# Patient Record
Sex: Female | Born: 1970 | Race: Black or African American | Hispanic: No | Marital: Single | Smoking: Current every day smoker
Health system: Southern US, Community
[De-identification: ages and names within clinical notes are randomized; demographics above are authoritative.]

## PROBLEM LIST (undated history)

## (undated) DIAGNOSIS — F32A Depression, unspecified: Secondary | ICD-10-CM

## (undated) DIAGNOSIS — R519 Headache, unspecified: Secondary | ICD-10-CM

## (undated) DIAGNOSIS — Z9289 Personal history of other medical treatment: Secondary | ICD-10-CM

## (undated) DIAGNOSIS — Z720 Tobacco use: Secondary | ICD-10-CM

## (undated) DIAGNOSIS — I10 Essential (primary) hypertension: Secondary | ICD-10-CM

## (undated) DIAGNOSIS — E785 Hyperlipidemia, unspecified: Secondary | ICD-10-CM

## (undated) DIAGNOSIS — N289 Disorder of kidney and ureter, unspecified: Secondary | ICD-10-CM

## (undated) DIAGNOSIS — F329 Major depressive disorder, single episode, unspecified: Secondary | ICD-10-CM

## (undated) DIAGNOSIS — I25119 Atherosclerotic heart disease of native coronary artery with unspecified angina pectoris: Secondary | ICD-10-CM

## (undated) DIAGNOSIS — R51 Headache: Secondary | ICD-10-CM

## (undated) DIAGNOSIS — I2102 ST elevation (STEMI) myocardial infarction involving left anterior descending coronary artery: Secondary | ICD-10-CM

## (undated) DIAGNOSIS — E119 Type 2 diabetes mellitus without complications: Secondary | ICD-10-CM

## (undated) DIAGNOSIS — N870 Mild cervical dysplasia: Secondary | ICD-10-CM

## (undated) DIAGNOSIS — I2119 ST elevation (STEMI) myocardial infarction involving other coronary artery of inferior wall: Secondary | ICD-10-CM

## (undated) HISTORY — DX: Hyperlipidemia, unspecified: E78.5

## (undated) HISTORY — DX: ST elevation (STEMI) myocardial infarction involving other coronary artery of inferior wall: I21.19

## (undated) HISTORY — DX: Essential (primary) hypertension: I10

## (undated) HISTORY — DX: ST elevation (STEMI) myocardial infarction involving left anterior descending coronary artery: I21.02

## (undated) HISTORY — PX: TRANSTHORACIC ECHOCARDIOGRAM: SHX275

## (undated) HISTORY — DX: Tobacco use: Z72.0

## (undated) HISTORY — DX: Atherosclerotic heart disease of native coronary artery with unspecified angina pectoris: I25.119

## (undated) HISTORY — PX: CHOLECYSTECTOMY: SHX55

## (undated) HISTORY — DX: Personal history of other medical treatment: Z92.89

---
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
## 1992-07-31 HISTORY — PX: TUBAL LIGATION: SHX77

## 1999-07-14 ENCOUNTER — Emergency Department (HOSPITAL_COMMUNITY): Admission: EM | Admit: 1999-07-14 | Discharge: 1999-07-14 | Payer: Self-pay | Admitting: Emergency Medicine

## 2000-09-11 ENCOUNTER — Emergency Department (HOSPITAL_COMMUNITY): Admission: EM | Admit: 2000-09-11 | Discharge: 2000-09-11 | Payer: Self-pay | Admitting: Emergency Medicine

## 2001-01-02 ENCOUNTER — Emergency Department (HOSPITAL_COMMUNITY): Admission: EM | Admit: 2001-01-02 | Discharge: 2001-01-02 | Payer: Self-pay | Admitting: Emergency Medicine

## 2001-01-21 ENCOUNTER — Encounter: Payer: Self-pay | Admitting: Internal Medicine

## 2001-01-21 ENCOUNTER — Ambulatory Visit (HOSPITAL_COMMUNITY): Admission: RE | Admit: 2001-01-21 | Discharge: 2001-01-21 | Payer: Self-pay | Admitting: Internal Medicine

## 2002-09-13 ENCOUNTER — Emergency Department (HOSPITAL_COMMUNITY): Admission: EM | Admit: 2002-09-13 | Discharge: 2002-09-13 | Payer: Self-pay | Admitting: Emergency Medicine

## 2002-11-26 ENCOUNTER — Other Ambulatory Visit: Admission: RE | Admit: 2002-11-26 | Discharge: 2002-11-26 | Payer: Self-pay | Admitting: Family Medicine

## 2004-06-29 ENCOUNTER — Ambulatory Visit: Payer: Self-pay | Admitting: Internal Medicine

## 2005-10-31 ENCOUNTER — Ambulatory Visit: Payer: Self-pay | Admitting: Internal Medicine

## 2005-11-02 ENCOUNTER — Ambulatory Visit: Payer: Self-pay | Admitting: Internal Medicine

## 2005-11-21 ENCOUNTER — Ambulatory Visit: Payer: Self-pay | Admitting: Internal Medicine

## 2005-11-21 ENCOUNTER — Other Ambulatory Visit: Admission: RE | Admit: 2005-11-21 | Discharge: 2005-11-21 | Payer: Self-pay | Admitting: Family Medicine

## 2005-11-21 ENCOUNTER — Encounter: Payer: Self-pay | Admitting: Internal Medicine

## 2006-09-10 ENCOUNTER — Emergency Department (HOSPITAL_COMMUNITY): Admission: EM | Admit: 2006-09-10 | Discharge: 2006-09-10 | Payer: Self-pay | Admitting: Emergency Medicine

## 2006-09-13 ENCOUNTER — Ambulatory Visit: Payer: Self-pay | Admitting: Internal Medicine

## 2006-09-17 ENCOUNTER — Ambulatory Visit: Payer: Self-pay | Admitting: *Deleted

## 2006-09-21 ENCOUNTER — Ambulatory Visit: Payer: Self-pay | Admitting: Internal Medicine

## 2006-10-05 ENCOUNTER — Ambulatory Visit: Payer: Self-pay | Admitting: Internal Medicine

## 2006-12-17 ENCOUNTER — Ambulatory Visit: Payer: Self-pay | Admitting: Internal Medicine

## 2006-12-19 ENCOUNTER — Ambulatory Visit: Payer: Self-pay | Admitting: Internal Medicine

## 2007-04-17 ENCOUNTER — Encounter (INDEPENDENT_AMBULATORY_CARE_PROVIDER_SITE_OTHER): Payer: Self-pay | Admitting: *Deleted

## 2007-05-28 ENCOUNTER — Ambulatory Visit: Payer: Self-pay | Admitting: *Deleted

## 2007-09-16 ENCOUNTER — Ambulatory Visit: Payer: Self-pay | Admitting: Internal Medicine

## 2007-11-12 ENCOUNTER — Encounter (INDEPENDENT_AMBULATORY_CARE_PROVIDER_SITE_OTHER): Payer: Self-pay | Admitting: Family Medicine

## 2007-11-12 ENCOUNTER — Ambulatory Visit: Payer: Self-pay | Admitting: Internal Medicine

## 2007-11-12 LAB — CONVERTED CEMR LAB
BUN: 13 mg/dL (ref 6–23)
Basophils Absolute: 0.1 10*3/uL (ref 0.0–0.1)
Basophils Relative: 1 % (ref 0–1)
CO2: 22 meq/L (ref 19–32)
Calcium: 9.8 mg/dL (ref 8.4–10.5)
Chloride: 100 meq/L (ref 96–112)
Creatinine, Ser: 0.78 mg/dL (ref 0.40–1.20)
Eosinophils Absolute: 0.5 10*3/uL (ref 0.0–0.7)
Eosinophils Relative: 4 % (ref 0–5)
Free T4: 0.92 ng/dL (ref 0.89–1.80)
Glucose, Bld: 127 mg/dL — ABNORMAL HIGH (ref 70–99)
HCT: 37.7 % (ref 36.0–46.0)
Hemoglobin: 12.3 g/dL (ref 12.0–15.0)
Lymphocytes Relative: 33 % (ref 12–46)
Lymphs Abs: 3.9 10*3/uL (ref 0.7–4.0)
MCHC: 32.6 g/dL (ref 30.0–36.0)
MCV: 82.3 fL (ref 78.0–100.0)
Monocytes Absolute: 0.8 10*3/uL (ref 0.1–1.0)
Monocytes Relative: 6 % (ref 3–12)
Neutro Abs: 6.7 10*3/uL (ref 1.7–7.7)
Neutrophils Relative %: 56 % (ref 43–77)
Platelets: 377 10*3/uL (ref 150–400)
Potassium: 4 meq/L (ref 3.5–5.3)
RBC: 4.58 M/uL (ref 3.87–5.11)
RDW: 15.7 % — ABNORMAL HIGH (ref 11.5–15.5)
Sodium: 138 meq/L (ref 135–145)
TSH: 0.899 microintl units/mL (ref 0.350–5.50)
WBC: 11.9 10*3/uL — ABNORMAL HIGH (ref 4.0–10.5)

## 2007-11-13 ENCOUNTER — Encounter (INDEPENDENT_AMBULATORY_CARE_PROVIDER_SITE_OTHER): Payer: Self-pay | Admitting: Family Medicine

## 2007-11-13 LAB — CONVERTED CEMR LAB: Hgb A1c MFr Bld: 6 % (ref 4.6–6.1)

## 2007-12-26 ENCOUNTER — Encounter (INDEPENDENT_AMBULATORY_CARE_PROVIDER_SITE_OTHER): Payer: Self-pay | Admitting: Family Medicine

## 2007-12-26 ENCOUNTER — Ambulatory Visit: Payer: Self-pay | Admitting: Family Medicine

## 2007-12-27 ENCOUNTER — Encounter: Payer: Self-pay | Admitting: Internal Medicine

## 2007-12-27 ENCOUNTER — Encounter (INDEPENDENT_AMBULATORY_CARE_PROVIDER_SITE_OTHER): Payer: Self-pay | Admitting: Family Medicine

## 2007-12-27 LAB — CONVERTED CEMR LAB
Chlamydia, DNA Probe: NEGATIVE
GC Probe Amp, Genital: NEGATIVE

## 2008-02-19 ENCOUNTER — Ambulatory Visit: Payer: Self-pay | Admitting: Internal Medicine

## 2008-02-21 ENCOUNTER — Ambulatory Visit: Payer: Self-pay | Admitting: Internal Medicine

## 2008-03-19 ENCOUNTER — Ambulatory Visit: Payer: Self-pay | Admitting: Internal Medicine

## 2008-03-22 ENCOUNTER — Emergency Department (HOSPITAL_COMMUNITY): Admission: EM | Admit: 2008-03-22 | Discharge: 2008-03-22 | Payer: Self-pay | Admitting: Emergency Medicine

## 2008-09-03 ENCOUNTER — Ambulatory Visit: Payer: Self-pay | Admitting: Family Medicine

## 2009-03-10 ENCOUNTER — Encounter: Admission: RE | Admit: 2009-03-10 | Discharge: 2009-03-10 | Payer: Self-pay | Admitting: Orthopedic Surgery

## 2010-01-04 ENCOUNTER — Emergency Department (HOSPITAL_COMMUNITY): Admission: EM | Admit: 2010-01-04 | Discharge: 2010-01-05 | Payer: Self-pay | Admitting: Emergency Medicine

## 2010-11-22 ENCOUNTER — Other Ambulatory Visit (HOSPITAL_COMMUNITY): Payer: Self-pay | Admitting: Family Medicine

## 2010-11-22 DIAGNOSIS — Z1231 Encounter for screening mammogram for malignant neoplasm of breast: Secondary | ICD-10-CM

## 2010-11-29 ENCOUNTER — Ambulatory Visit (HOSPITAL_COMMUNITY)
Admission: RE | Admit: 2010-11-29 | Discharge: 2010-11-29 | Disposition: A | Discharge status: Home / Self Care | Payer: Medicaid Other | Source: Ambulatory Visit | Attending: Family Medicine | Admitting: Family Medicine

## 2010-11-29 DIAGNOSIS — Z1231 Encounter for screening mammogram for malignant neoplasm of breast: Secondary | ICD-10-CM | POA: Insufficient documentation

## 2010-12-30 DIAGNOSIS — E119 Type 2 diabetes mellitus without complications: Secondary | ICD-10-CM | POA: Insufficient documentation

## 2010-12-30 HISTORY — DX: Type 2 diabetes mellitus without complications: E11.9

## 2011-01-12 ENCOUNTER — Other Ambulatory Visit (HOSPITAL_COMMUNITY): Payer: Self-pay | Admitting: Family Medicine

## 2011-01-12 DIAGNOSIS — R58 Hemorrhage, not elsewhere classified: Secondary | ICD-10-CM

## 2011-01-12 DIAGNOSIS — N926 Irregular menstruation, unspecified: Secondary | ICD-10-CM

## 2011-01-17 ENCOUNTER — Other Ambulatory Visit (HOSPITAL_COMMUNITY): Payer: Medicaid Other

## 2011-01-17 ENCOUNTER — Ambulatory Visit (HOSPITAL_COMMUNITY)
Admission: RE | Admit: 2011-01-17 | Discharge: 2011-01-17 | Disposition: A | Discharge status: Home / Self Care | Payer: Medicaid Other | Source: Ambulatory Visit | Attending: Family Medicine | Admitting: Family Medicine

## 2011-01-17 DIAGNOSIS — N949 Unspecified condition associated with female genital organs and menstrual cycle: Secondary | ICD-10-CM | POA: Insufficient documentation

## 2011-01-17 DIAGNOSIS — N925 Other specified irregular menstruation: Secondary | ICD-10-CM | POA: Insufficient documentation

## 2011-01-17 DIAGNOSIS — R58 Hemorrhage, not elsewhere classified: Secondary | ICD-10-CM

## 2011-01-17 DIAGNOSIS — N938 Other specified abnormal uterine and vaginal bleeding: Secondary | ICD-10-CM | POA: Insufficient documentation

## 2011-01-17 DIAGNOSIS — N926 Irregular menstruation, unspecified: Secondary | ICD-10-CM

## 2011-01-17 DIAGNOSIS — D259 Leiomyoma of uterus, unspecified: Secondary | ICD-10-CM | POA: Insufficient documentation

## 2011-04-12 ENCOUNTER — Encounter: Payer: Medicaid Other | Admitting: Obstetrics & Gynecology

## 2011-05-22 ENCOUNTER — Other Ambulatory Visit (HOSPITAL_COMMUNITY)
Admission: RE | Admit: 2011-05-22 | Discharge: 2011-05-22 | Disposition: A | Discharge status: Home / Self Care | Payer: Medicaid Other | Source: Ambulatory Visit | Attending: Obstetrics & Gynecology | Admitting: Obstetrics & Gynecology

## 2011-05-22 ENCOUNTER — Ambulatory Visit (INDEPENDENT_AMBULATORY_CARE_PROVIDER_SITE_OTHER): Payer: Medicaid Other | Admitting: Obstetrics & Gynecology

## 2011-05-22 ENCOUNTER — Encounter: Payer: Self-pay | Admitting: Obstetrics & Gynecology

## 2011-05-22 VITALS — BP 145/92 | HR 78 | Temp 97.5°F | Ht 67.5 in | Wt 253.0 lb

## 2011-05-22 DIAGNOSIS — A5901 Trichomonal vulvovaginitis: Secondary | ICD-10-CM | POA: Insufficient documentation

## 2011-05-22 DIAGNOSIS — Z124 Encounter for screening for malignant neoplasm of cervix: Secondary | ICD-10-CM

## 2011-05-22 DIAGNOSIS — N925 Other specified irregular menstruation: Secondary | ICD-10-CM | POA: Insufficient documentation

## 2011-05-22 DIAGNOSIS — N949 Unspecified condition associated with female genital organs and menstrual cycle: Secondary | ICD-10-CM | POA: Insufficient documentation

## 2011-05-22 DIAGNOSIS — Z01419 Encounter for gynecological examination (general) (routine) without abnormal findings: Secondary | ICD-10-CM | POA: Insufficient documentation

## 2011-05-22 DIAGNOSIS — Z01812 Encounter for preprocedural laboratory examination: Secondary | ICD-10-CM

## 2011-05-22 DIAGNOSIS — N938 Other specified abnormal uterine and vaginal bleeding: Secondary | ICD-10-CM | POA: Insufficient documentation

## 2011-05-22 DIAGNOSIS — N920 Excessive and frequent menstruation with regular cycle: Secondary | ICD-10-CM

## 2011-05-22 NOTE — Progress Notes (Signed)
Subjective:    Patient ID: Amy Chaney, female    DOB: Oct 25, 1970, 40 y.o.   MRN: [id number]  [id number] Patient's last menstrual period was 05/18/2011. Irregular heavy periods, may last up to a mo. Bleeding currently. Consents to EMBx. U/S reviewed.  Past Medical History  Diagnosis Date  . Diabetes mellitus 12/2010    type II  . Hypertension    Past Surgical History  Procedure Date  . Tubal ligation    No current outpatient prescriptions on file prior to visit.  Current outpatient prescriptions:atenolol (TENORMIN) 50 MG tablet, Take 50 mg by mouth daily.  , Disp: , Rfl: ;  ferrous sulfate 325 (65 FE) MG EC tablet, Take 325 mg by mouth daily.  , Disp: , Rfl: ;  hydrochlorothiazide (HYDRODIURIL) 25 MG tablet, Take 25 mg by mouth daily.  , Disp: , Rfl: ;  metFORMIN (GLUCOPHAGE) 500 MG tablet, Take 500 mg by mouth 2 (two) times daily with a meal.  , Disp: , Rfl:  sertraline (ZOLOFT) 50 MG tablet, Take 50 mg by mouth daily.  , Disp: , Rfl:  No Known Allergies Family History  Problem Relation Age of Onset  . Diabetes Mother   . Diabetes Sister   . Diabetes Son     type 1  . Diabetes Sister   . Diabetes Son     type 2     Review of Systems     Objective:   Physical Exam Filed Vitals:   05/22/11 1548  BP: 145/92  Pulse: 78  Temp: 97.5 F (36.4 C)    NAD, pleasant Obese  Pelvic EBUS nl, vaginal blood, cervix parous, 6-8 weeks, pap done, EMB with Milex sampler, sounds to 10cm, tissue to path  *RADIOLOGY REPORT*  Clinical Data: Dysfunctional uterine bleeding with prolonged  cycles. LMP 11/17/2010  TRANSABDOMINAL AND TRANSVAGINAL ULTRASOUND OF PELVIS  Technique: Both transabdominal and transvaginal ultrasound  examinations of the pelvis were performed. Transabdominal technique  was performed for global imaging of the pelvis including uterus,  ovaries, adnexal regions, and pelvic cul-de-sac.  Comparison: None.  It was necessary to proceed with endovaginal exam  following the  transabdomnial exam to visualize the endometrium and ovaries.  Findings:  Uterus: Demonstrates a sagittal length of 10.2 cm, AP depth of 5.3  cm and a transverse width of 6.6 cm. Several focal fibroids are  identified and have sizes and locations as follows: In the fundal  region measuring 1.4 x 1.2 x 1.6 cm, in the posterior upper uterine  segment measuring 1.6 x 1.3 x 1.3 cm and in the left posterolateral  lower uterine segment measuring 1.6 x 1.2 x 1.6 cm. Fibroids are  all mural.  Endometrium: An area of focal echogenic thickening is identified in  the left lateral midportion of the endometrial lining measuring 1.1  x 0.6 x 0.6 cm. This has an appearance suspicious for a focal  polyp although a feeding vessel was not seen to confirm this  impression. The remainder of the the endometrial lining is  slightly hypoechoic with a depth of 1.2 centimeters. The  appearance is suspicious for an immediate presecretory endometrial  stripe.  Right ovary: Has a normal appearance measuring 3.0 x 1.5 x 1.9 cm  Left ovary: Has a normal appearance measuring 3.2 x 1.6 x 1.6 cm  Other findings: No pelvic fluid or separate adnexal masses are  seen.  IMPRESSION:  Fibroid uterus with fibroid size and locations as noted above.  Normal ovaries.  Endometrial  appearance suggesting an immediate presecretory phase  to the stripe with an area of focal echogenic thickening  questionably related to a polyp. This can be reevaluated in the  immediate postsecretory phase of the cycle and if this finding is  persistent, further evaluation with sonohysterography can be  undertaken for confirmation of this impression.  .  Original Report Authenticated By: Bertha Stakes, M.D.          Assessment & Plan:  Menometrorrhagia, fibroid RTC 2 weeks

## 2011-05-26 MED ORDER — METRONIDAZOLE 500 MG PO TABS: 500.0000 mg | ORAL_TABLET | Freq: Two times a day (BID) | ORAL | Status: AC

## 2011-05-26 NOTE — Progress Notes (Signed)
Addended by: Adam Phenix on: 05/26/2011 04:13 PM   Modules accepted: Orders

## 2011-05-29 ENCOUNTER — Telehealth: Payer: Self-pay | Admitting: *Deleted

## 2011-05-29 NOTE — Telephone Encounter (Signed)
Message copied by Lynnell Dike on Mon May 29, 2011  4:37 PM ------      Message from: Adam Phenix      Created: Fri May 26, 2011  4:10 PM       Trichomonas on pap. Rx for flagyl ordered, will go to her pharmacy.

## 2011-05-29 NOTE — Telephone Encounter (Signed)
Telephone home # pt was unavailable. Need to let pt know about results and that med was called in to her pharmacy.

## 2011-05-30 NOTE — Telephone Encounter (Signed)
Called home telephone # and was unable to leave message due to customer being out of service area.  Tried to call the mobile # and it was the wrong #.

## 2011-05-31 ENCOUNTER — Encounter: Payer: Self-pay | Admitting: *Deleted

## 2011-05-31 NOTE — Telephone Encounter (Signed)
Called pt mobile number  And was told by a female I had the wrong number, Called pt. Home number and received a message [employer] customer is unavailable. Called work number and was told pt. No longer works there.

## 2011-05-31 NOTE — Telephone Encounter (Signed)
Also called pt contact number and received a message this number is disconnected .

## 2011-06-06 ENCOUNTER — Telehealth: Payer: Self-pay | Admitting: *Deleted

## 2011-06-06 NOTE — Telephone Encounter (Signed)
Pt left message stating that she was not able to pick up her Rx right away and now the pharmacy is requiring a new Rx to be called in. She would like it to be called to [facility] on [address]. I called and spoke w/pt- she says she will need 3 days to be able to pick up the medication because she never knows when she will have transportation. I told pt that I will call in the Rx and ask them to put it on hold until she calls to say that she will pick it up. Pt voiced understanding.  Rx called in per original order on 10/26.

## 2011-06-07 ENCOUNTER — Ambulatory Visit: Payer: Medicaid Other | Admitting: Obstetrics & Gynecology

## 2011-08-10 ENCOUNTER — Encounter: Payer: Self-pay | Admitting: Obstetrics & Gynecology

## 2011-08-10 ENCOUNTER — Ambulatory Visit (INDEPENDENT_AMBULATORY_CARE_PROVIDER_SITE_OTHER): Payer: Medicaid Other | Admitting: Obstetrics & Gynecology

## 2011-08-10 VITALS — BP 167/99 | HR 85 | Temp 97.1°F | Ht 69.0 in | Wt 258.3 lb

## 2011-08-10 DIAGNOSIS — N925 Other specified irregular menstruation: Secondary | ICD-10-CM

## 2011-08-10 DIAGNOSIS — N949 Unspecified condition associated with female genital organs and menstrual cycle: Secondary | ICD-10-CM

## 2011-08-10 DIAGNOSIS — N938 Other specified abnormal uterine and vaginal bleeding: Secondary | ICD-10-CM

## 2011-08-10 DIAGNOSIS — D259 Leiomyoma of uterus, unspecified: Secondary | ICD-10-CM

## 2011-08-10 DIAGNOSIS — A599 Trichomoniasis, unspecified: Secondary | ICD-10-CM

## 2011-08-10 DIAGNOSIS — D219 Benign neoplasm of connective and other soft tissue, unspecified: Secondary | ICD-10-CM

## 2011-08-10 MED ORDER — METRONIDAZOLE 500 MG PO TABS: ORAL_TABLET | ORAL | Status: DC

## 2011-08-10 NOTE — Progress Notes (Signed)
  Subjective:    Patient ID: Amy Chaney, female    DOB: 1971/07/02, 41 y.o.   MRN: [id number]  HPI  41 yo S AA G3P3 who is here to discuss her EMBX results.   Review of Systems Abstinent for a year, took her flagyl for the trich seen on her pap    Objective:   Physical Exam  EMBX negative      Assessment & Plan:  DUB with small fibroids- I have offered her a Novasure ablation.Prior to any surgery she will need cardiac clearance because she complains of intermittent chest pain. She will go back to [facility] and get directed to a cardiologist from there.

## 2011-09-05 ENCOUNTER — Ambulatory Visit (INDEPENDENT_AMBULATORY_CARE_PROVIDER_SITE_OTHER): Payer: Medicaid Other | Admitting: Cardiovascular Disease

## 2011-09-05 ENCOUNTER — Encounter: Payer: Self-pay | Admitting: Cardiovascular Disease

## 2011-09-05 DIAGNOSIS — I5032 Chronic diastolic (congestive) heart failure: Secondary | ICD-10-CM | POA: Insufficient documentation

## 2011-09-05 DIAGNOSIS — I110 Hypertensive heart disease with heart failure: Secondary | ICD-10-CM | POA: Insufficient documentation

## 2011-09-05 DIAGNOSIS — R072 Precordial pain: Secondary | ICD-10-CM

## 2011-09-05 DIAGNOSIS — R0989 Other specified symptoms and signs involving the circulatory and respiratory systems: Secondary | ICD-10-CM

## 2011-09-05 DIAGNOSIS — I10 Essential (primary) hypertension: Secondary | ICD-10-CM

## 2011-09-05 DIAGNOSIS — R0789 Other chest pain: Secondary | ICD-10-CM | POA: Insufficient documentation

## 2011-09-05 NOTE — Assessment & Plan Note (Signed)
The patient has chest pain with typical and atypical features. I think she is at high risk of premature coronary artery disease as she has multiple cardiac risk factors of obesity, type 2 diabetes, hypertension, and smoking. I do not know her lipid status but will plan on checking a cholesterol panel. She has carotid bruits and no slightly has atherosclerotic disease. I think she needs further risk stratification with an exercise Myoview stress test and this will be scheduled.

## 2011-09-05 NOTE — Assessment & Plan Note (Signed)
Blood pressure is elevated today. She is on a combination of atenolol and hydrochlorothiazide. She will be seen back in close followup in 4 weeks and we will reassess at that time. I would have a low threshold to start her on an ACE inhibitor or ARB and she probably should be started on one of these agents considering her diabetes.

## 2011-09-05 NOTE — Assessment & Plan Note (Signed)
Recommend a carotid duplex ultrasound for further evaluation.

## 2011-09-05 NOTE — Progress Notes (Signed)
HPI:  This is a 41 year old woman with hypertension, diabetes, and tobacco use, presenting for initial evaluation of chest pain. The patient has been tentatively scheduled for a NovaSure ablation for treatment of heavy menses and anemia.  She reports episodes of palpitations and rapid heart rates, worse when she is lying on her left side. She also complains of sharp chest pain and exertional dyspnea. Chest pain occurs in the substernal region and is not consistently related to exertion. She denies orthopnea, PND, or leg swelling. She has had no lightheadedness or syncope.  The patient does not know her family history well, but she thinks both parents have had heart problems. She also has a brother who had a stent placed when he was in his 75s.  Outpatient Encounter Prescriptions as of 09/05/2011  Medication Sig Dispense Refill  . atenolol (TENORMIN) 50 MG tablet Take 50 mg by mouth daily.        . ferrous sulfate 325 (65 FE) MG EC tablet Take 325 mg by mouth daily.        . hydrochlorothiazide (HYDRODIURIL) 25 MG tablet Take 25 mg by mouth daily.        . metFORMIN (GLUCOPHAGE) 500 MG tablet Take 500 mg by mouth 2 (two) times daily with a meal.        . sertraline (ZOLOFT) 50 MG tablet Take 50 mg by mouth daily.          Review of patient's allergies indicates no known allergies.  Past Medical History  Diagnosis Date  . Diabetes mellitus 12/2010    type II  . Hypertension     Past Surgical History  Procedure Date  . Tubal ligation     History   Social History  . Marital Status: Single    Spouse Name: N/A    Number of Children: N/A  . Years of Education: N/A   Occupational History  . Not on file.   Social History Main Topics  . Smoking status: Current Everyday Smoker  . Smokeless tobacco: Never Used  . Alcohol Use: No  . Drug Use: No  . Sexually Active: Yes    Birth Control/ Protection: Surgical   Other Topics Concern  . Not on file   Social History Narrative  . No  narrative on file    Family History  Problem Relation Age of Onset  . Diabetes Mother   . Diabetes Sister   . Diabetes Son     type 1  . Diabetes Sister   . Diabetes Son     type 2    ROS: General: no fevers/chills/night sweats Eyes: no blurry vision, diplopia, or amaurosis ENT: no sore throat or hearing loss Resp: no cough, wheezing, or hemoptysis CV: no edema or palpitations GI: no abdominal pain, nausea, vomiting, diarrhea, or constipation GU: no dysuria, frequency, or hematuria. Positive for heavy menstrual bleeding. Skin: no rash Neuro: no headache, numbness, tingling, or weakness of extremities Musculoskeletal: no joint pain or swelling Heme: no bleeding, DVT, or easy bruising Endo: no polydipsia or polyuria  BP 152/92  Pulse 80  Ht 5\' 9"  (1.753 m)  Wt 117.482 kg (259 lb)  BMI 38.25 kg/m2  LMP 07/19/2011  PHYSICAL EXAM: Pt is alert and oriented, WD, WN, in no distress. HEENT: normal except for poor dentition Neck: JVP normal. Carotid upstrokes normal with bilateral bruits. No thyromegaly. Lungs: equal expansion, clear bilaterally CV: Apex is discrete and nondisplaced, RRR with grade 2/6 systolic murmur at the left upper  sternal border Abd: soft, NT, +BS, no bruit, no hepatosplenomegaly, Obese Back: no CVA tenderness Ext: no C/C/E        Femoral pulses 2+= without bruits        DP/PT pulses intact and = Skin: warm and dry without rash Neuro: CNII-XII intact             Strength intact = bilaterally  EKG:  Normal sinus rhythm 80 beats per minute, minimal voltage criteria for LVH maybe normal variant.  ASSESSMENT AND PLAN:

## 2011-09-05 NOTE — Patient Instructions (Signed)
Your physician recommends that you schedule a follow-up appointment in: 4 WEEKS  Your physician has requested that you have a carotid duplex. This test is an ultrasound of the carotid arteries in your neck. It looks at blood flow through these arteries that supply the brain with blood. Allow one hour for this exam. There are no restrictions or special instructions.  Your physician has requested that you have an exercise stress myoview. For further information please visit [email or web address] Please follow instruction sheet, as given.  Your physician recommends that you continue on your current medications as directed. Please refer to the Current Medication list given to you today.

## 2011-09-06 ENCOUNTER — Telehealth: Payer: Self-pay

## 2011-09-06 DIAGNOSIS — I10 Essential (primary) hypertension: Secondary | ICD-10-CM

## 2011-09-06 DIAGNOSIS — R072 Precordial pain: Secondary | ICD-10-CM

## 2011-09-06 NOTE — Telephone Encounter (Signed)
I spoke with the pt and made her aware that we will check fasting labs on 09/14/11 when she is in the office for myoview.

## 2011-09-06 NOTE — Telephone Encounter (Signed)
    Lauren - please schedule her for fasting lipids when she comes in for one of her other test, thx.   Note from Dr Excell Seltzer

## 2011-09-14 ENCOUNTER — Other Ambulatory Visit (INDEPENDENT_AMBULATORY_CARE_PROVIDER_SITE_OTHER): Payer: Medicaid Other | Admitting: *Deleted

## 2011-09-14 ENCOUNTER — Ambulatory Visit (HOSPITAL_COMMUNITY): Payer: Medicaid Other | Attending: Cardiology | Admitting: Radiology

## 2011-09-14 VITALS — BP 131/86 | Ht 69.0 in | Wt 255.0 lb

## 2011-09-14 DIAGNOSIS — R5381 Other malaise: Secondary | ICD-10-CM | POA: Insufficient documentation

## 2011-09-14 DIAGNOSIS — R0609 Other forms of dyspnea: Secondary | ICD-10-CM | POA: Insufficient documentation

## 2011-09-14 DIAGNOSIS — R0789 Other chest pain: Secondary | ICD-10-CM

## 2011-09-14 DIAGNOSIS — Z8249 Family history of ischemic heart disease and other diseases of the circulatory system: Secondary | ICD-10-CM | POA: Insufficient documentation

## 2011-09-14 DIAGNOSIS — R002 Palpitations: Secondary | ICD-10-CM | POA: Insufficient documentation

## 2011-09-14 DIAGNOSIS — E119 Type 2 diabetes mellitus without complications: Secondary | ICD-10-CM | POA: Insufficient documentation

## 2011-09-14 DIAGNOSIS — R072 Precordial pain: Secondary | ICD-10-CM

## 2011-09-14 DIAGNOSIS — F17200 Nicotine dependence, unspecified, uncomplicated: Secondary | ICD-10-CM | POA: Insufficient documentation

## 2011-09-14 DIAGNOSIS — R55 Syncope and collapse: Secondary | ICD-10-CM | POA: Insufficient documentation

## 2011-09-14 DIAGNOSIS — R5383 Other fatigue: Secondary | ICD-10-CM | POA: Insufficient documentation

## 2011-09-14 DIAGNOSIS — R42 Dizziness and giddiness: Secondary | ICD-10-CM | POA: Insufficient documentation

## 2011-09-14 DIAGNOSIS — R0602 Shortness of breath: Secondary | ICD-10-CM

## 2011-09-14 DIAGNOSIS — R000 Tachycardia, unspecified: Secondary | ICD-10-CM | POA: Insufficient documentation

## 2011-09-14 DIAGNOSIS — R0989 Other specified symptoms and signs involving the circulatory and respiratory systems: Secondary | ICD-10-CM | POA: Insufficient documentation

## 2011-09-14 DIAGNOSIS — I10 Essential (primary) hypertension: Secondary | ICD-10-CM | POA: Insufficient documentation

## 2011-09-14 DIAGNOSIS — R079 Chest pain, unspecified: Secondary | ICD-10-CM

## 2011-09-14 DIAGNOSIS — E669 Obesity, unspecified: Secondary | ICD-10-CM | POA: Insufficient documentation

## 2011-09-14 LAB — HEPATIC FUNCTION PANEL
ALT: 11 U/L (ref 0–35)
Alkaline Phosphatase: 42 U/L (ref 39–117)
Bilirubin, Direct: 0.1 mg/dL (ref 0.0–0.3)
Total Protein: 7.6 g/dL (ref 6.0–8.3)

## 2011-09-14 LAB — LIPID PANEL: Total CHOL/HDL Ratio: 3

## 2011-09-14 MED ORDER — TECHNETIUM TC 99M TETROFOSMIN IV KIT
30.0000 | PACK | Freq: Once | INTRAVENOUS | Status: AC | PRN
  Administered 2011-09-14: 30 via INTRAVENOUS

## 2011-09-14 NOTE — Progress Notes (Signed)
[facility] SITE 3 NUCLEAR MED [address] [place] [address] [phone]  Cardiology Nuclear Med Study  Amy Chaney is a 41 y.o. female [id number] 23-Apr-1971   Nuclear Med Background Indication for Stress Test:  Evaluation for Ischemia History:  No previous documented CAD Cardiac Risk Factors: Family History - CAD, Hypertension, NIDDM, Obesity and Smoker  Symptoms:  Chest Pain with and without Exertion (last episode of chest discomfort was last night), Dizziness, DOE, Fatigue, Near Syncope, Palpitations, Rapid HR and Syncope (patient had an episode "this past summer")   Nuclear Pre-Procedure Caffeine/Decaff Intake:  None NPO After: 8:30pm   Lungs:  clear IV 0.9% NS with Angio Cath:  22g  IV Site: R Antecubital  IV Started by:  Amy Chaney, EMT-P  Chest Size (in):  46 Cup Size: DDD  Height: 5\' 9"  (1.753 m)  Weight:  255 lb (115.667 kg)  BMI:  Body mass index is 37.66 kg/(m^2). Tech Comments:  Atenolol held > 48 hours, per patient.    Nuclear Med Study 1 or 2 day study: 2 day  Stress Test Type:  Stress  Reading MD: Willa Rough, MD  Order Authorizing Provider:  Tonny Bollman, MD  Resting Radionuclide: Technetium 68m Tetrofosmin  Resting Radionuclide Dose: 33.0 mCi on 09/20/11   Stress Radionuclide:  Technetium 58m Tetrofosmin  Stress Radionuclide Dose: 33.0 mCi on 09/14/11           Stress Protocol Rest HR: 63 Stress HR: 164  Rest BP: Sitting 131/86  Standing 122/79 Stress BP: 209/53  Exercise Time (min): 4:46 METS: 6.7   Predicted Max HR: 179 bpm % Max HR: 91.62 bpm Rate Pressure Product: 21308   Dose of Adenosine (mg):  n/a Dose of Lexiscan: n/a mg  Dose of Atropine (mg): n/a Dose of Dobutamine: n/a mcg/kg/min (at max HR)  Stress Test Technologist: Amy Chaney, CMA-N  Nuclear Technologist:  Amy Chaney, CNMT     Rest Procedure:  Myocardial perfusion imaging was performed at rest 45 minutes following the intravenous administration of  Technetium 36m Tetrofosmin.  Rest ECG: LVH and probable prior IWMI.  Stress Procedure:  The patient exercised for 4:46 on the treadmill utilizing the Bruce protocol.  The patient stopped due to fatigue and dyspnea (O2 SATs 99-100%).  She denied any chest pain.  There were ST-T wave changes, mostly in recovery.  Hypertensive response to exercise, 209/53. Technetium 2m Tetrofosmin was injected at peak exercise and myocardial perfusion imaging was performed after a brief delay.  Stress ECG: No significant change from baseline ECG  QPS Raw Data Images:  Normal; no motion artifact; normal heart/lung ratio. Stress Images:  Normal homogeneous uptake in all areas of the myocardium. Rest Images:  Normal homogeneous uptake in all areas of the myocardium. Subtraction (SDS):  Normal Transient Ischemic Dilatation (Normal <1.22):  1.02 Lung/Heart Ratio (Normal <0.45):  0.30  Quantitative Gated Spect Images QGS EDV:  139 ml QGS ESV:  66 ml QGS cine images:  NL LV Function; NL Wall Motion QGS EF: 53%  Impression Exercise Capacity:  Fair exercise capacity. BP Response:  Hypertensive blood pressure response. Clinical Symptoms:  There is dyspnea. ECG Impression:  No significant ST segment change suggestive of ischemia. Comparison with Prior Nuclear Study: No previous nuclear study performed  Overall Impression:  Normal stress nuclear study. Surface and tomographic images appear to show left ventricular enlargement  Amy Chaney

## 2011-09-18 ENCOUNTER — Encounter (HOSPITAL_COMMUNITY): Payer: Medicaid Other | Admitting: Radiology

## 2011-09-20 ENCOUNTER — Ambulatory Visit (HOSPITAL_COMMUNITY): Payer: Medicaid Other | Attending: Cardiovascular Disease

## 2011-09-20 DIAGNOSIS — R0989 Other specified symptoms and signs involving the circulatory and respiratory systems: Secondary | ICD-10-CM

## 2011-09-20 MED ORDER — TECHNETIUM TC 99M TETROFOSMIN IV KIT
33.0000 | PACK | Freq: Once | INTRAVENOUS | Status: AC | PRN
  Administered 2011-09-20: 33 via INTRAVENOUS

## 2011-09-22 ENCOUNTER — Encounter (INDEPENDENT_AMBULATORY_CARE_PROVIDER_SITE_OTHER): Payer: Medicaid Other

## 2011-09-22 DIAGNOSIS — R0989 Other specified symptoms and signs involving the circulatory and respiratory systems: Secondary | ICD-10-CM

## 2011-09-22 DIAGNOSIS — I6529 Occlusion and stenosis of unspecified carotid artery: Secondary | ICD-10-CM

## 2011-09-29 ENCOUNTER — Encounter: Payer: Self-pay | Admitting: Cardiovascular Disease

## 2011-09-29 ENCOUNTER — Ambulatory Visit (INDEPENDENT_AMBULATORY_CARE_PROVIDER_SITE_OTHER): Payer: Medicaid Other | Admitting: Cardiovascular Disease

## 2011-09-29 VITALS — BP 140/88 | HR 76 | Ht 69.0 in | Wt 263.0 lb

## 2011-09-29 DIAGNOSIS — I10 Essential (primary) hypertension: Secondary | ICD-10-CM

## 2011-09-29 DIAGNOSIS — R0789 Other chest pain: Secondary | ICD-10-CM

## 2011-09-29 DIAGNOSIS — R072 Precordial pain: Secondary | ICD-10-CM

## 2011-09-29 DIAGNOSIS — N920 Excessive and frequent menstruation with regular cycle: Secondary | ICD-10-CM

## 2011-09-29 DIAGNOSIS — R0989 Other specified symptoms and signs involving the circulatory and respiratory systems: Secondary | ICD-10-CM

## 2011-09-29 DIAGNOSIS — Z01812 Encounter for preprocedural laboratory examination: Secondary | ICD-10-CM

## 2011-09-29 MED ORDER — LOSARTAN POTASSIUM 50 MG PO TABS: 50.0000 mg | ORAL_TABLET | Freq: Every day | ORAL | Status: DC

## 2011-09-29 MED ORDER — HYDROCHLOROTHIAZIDE 25 MG PO TABS: 25.0000 mg | ORAL_TABLET | Freq: Every day | ORAL | Status: DC

## 2011-09-29 MED ORDER — ATENOLOL 50 MG PO TABS: 50.0000 mg | ORAL_TABLET | Freq: Every day | ORAL | Status: DC

## 2011-09-29 NOTE — Patient Instructions (Signed)
Your physician has recommended you make the following change in your medication: START Losartan 50mg  take one by mouth daily  Your physician recommends that you return for lab work in: 2 [facility]  Your physician wants you to follow-up in: 1 YEAR.  You will receive a reminder letter in the mail two months in advance. If you don't receive a letter, please call our office to schedule the follow-up appointment.  Your physician has requested that you have a carotid duplex in 1 YEAR. This test is an ultrasound of the carotid arteries in your neck. It looks at blood flow through these arteries that supply the brain with blood. Allow one hour for this exam. There are no restrictions or special instructions.

## 2011-10-05 ENCOUNTER — Telehealth: Payer: Self-pay

## 2011-10-05 MED ORDER — LISINOPRIL 10 MG PO TABS: 10.0000 mg | ORAL_TABLET | Freq: Every day | ORAL | Status: DC

## 2011-10-05 NOTE — Telephone Encounter (Signed)
Ok let's try lisinopril 10 mg ----- Message ----- From: Sharyn Blitz, RN Sent: 10/04/2011 5:18 PM To: Micheline Chapman, MD Subject: Losartan not covered by insurance   Pt needs to try ACE before ARB

## 2011-10-05 NOTE — Telephone Encounter (Signed)
I spoke with the pt and made her aware of medication change.   New Rx sent to pharmacy.

## 2011-10-09 ENCOUNTER — Other Ambulatory Visit: Payer: Self-pay

## 2011-10-09 MED ORDER — LOSARTAN POTASSIUM 50 MG PO TABS: 50.0000 mg | ORAL_TABLET | Freq: Every day | ORAL | Status: DC

## 2011-10-10 ENCOUNTER — Encounter: Payer: Self-pay | Admitting: Cardiovascular Disease

## 2011-10-10 NOTE — Assessment & Plan Note (Signed)
Chest pain improved, Myoview scan without ischemia.

## 2011-10-10 NOTE — Assessment & Plan Note (Signed)
suboptimal control in patient with diabetes. Add ARB (losartan) and check BMET in 2 weeks.

## 2011-10-10 NOTE — Progress Notes (Signed)
   HPI:  41 year-old woman presenting for follow-up evaluation. She was first seen 09/05/11 with chest pain, palpitations, and exertional dyspnea. She underwent a Myoview Stress test because of multiple CV risk factors ( HTN, DM, obesity, smoking, and family hx premature CAD ). This study was normal - no ischemia, LVEF 53%, LV enlargement was suggested. She also had a carotid duplex to evaluate carotid bruits - this showed 40-69% bilateral carotid stenosis without plaque.  Overall she is doing well. Chest pain has eased up. No edema, lightheadedness, or PND.  Outpatient Encounter Prescriptions as of 09/29/2011  Medication Sig Dispense Refill  . atenolol (TENORMIN) 50 MG tablet Take 1 tablet (50 mg total) by mouth daily.  90 tablet  3  . ferrous sulfate 325 (65 FE) MG EC tablet Take 325 mg by mouth daily.        . hydrochlorothiazide (HYDRODIURIL) 25 MG tablet Take 1 tablet (25 mg total) by mouth daily.  90 tablet  3  . metFORMIN (GLUCOPHAGE) 500 MG tablet Take 500 mg by mouth 2 (two) times daily with a meal.        . sertraline (ZOLOFT) 50 MG tablet Take 50 mg by mouth daily.        Marland Kitchen DISCONTD: atenolol (TENORMIN) 50 MG tablet Take 50 mg by mouth daily.        Marland Kitchen DISCONTD: hydrochlorothiazide (HYDRODIURIL) 25 MG tablet Take 25 mg by mouth daily.        Marland Kitchen DISCONTD: losartan (COZAAR) 50 MG tablet Take 1 tablet (50 mg total) by mouth daily.  90 tablet  3    No Known Allergies  Past Medical History  Diagnosis Date  . Diabetes mellitus 12/2010    type II  . Hypertension    BP 140/88  Pulse 76  Ht 5\' 9"  (1.753 m)  Wt 119.296 kg (263 lb)  BMI 38.84 kg/m2  PHYSICAL EXAM: Pt is alert and oriented, obese woman in NAD HEENT: normal Neck: JVP - normal, carotids 2+= with bilateral bruits Lungs: CTA bilaterally CV: RRR with 2/6 systolic murmur at the RUSB Abd: soft, NT, Positive BS, no hepatomegaly Ext: no C/C/E, distal pulses intact and equal Skin: warm/dry no rash  ASSESSMENT AND PLAN:

## 2011-10-10 NOTE — Assessment & Plan Note (Signed)
No plaque, but velocity measurements suggest moderate stenosis bilaterally. Repeat carotids next year and if unchanged will spread out interval of follow-up.

## 2011-10-16 ENCOUNTER — Other Ambulatory Visit: Payer: Medicaid Other

## 2011-11-24 ENCOUNTER — Other Ambulatory Visit (HOSPITAL_COMMUNITY): Payer: Self-pay | Admitting: Family Medicine

## 2011-11-24 DIAGNOSIS — Z1231 Encounter for screening mammogram for malignant neoplasm of breast: Secondary | ICD-10-CM

## 2011-12-21 ENCOUNTER — Ambulatory Visit (HOSPITAL_COMMUNITY)
Admission: RE | Admit: 2011-12-21 | Discharge: 2011-12-21 | Disposition: A | Discharge status: Home / Self Care | Payer: Medicaid Other | Source: Ambulatory Visit | Attending: Family Medicine | Admitting: Family Medicine

## 2011-12-21 DIAGNOSIS — Z1231 Encounter for screening mammogram for malignant neoplasm of breast: Secondary | ICD-10-CM | POA: Insufficient documentation

## 2011-12-27 ENCOUNTER — Other Ambulatory Visit: Payer: Self-pay | Admitting: Family Medicine

## 2011-12-27 DIAGNOSIS — R928 Other abnormal and inconclusive findings on diagnostic imaging of breast: Secondary | ICD-10-CM

## 2012-01-19 ENCOUNTER — Ambulatory Visit
Admission: RE | Admit: 2012-01-19 | Discharge: 2012-01-19 | Disposition: A | Discharge status: Home / Self Care | Payer: Medicaid Other | Source: Ambulatory Visit | Attending: Family Medicine | Admitting: Family Medicine

## 2012-01-19 DIAGNOSIS — R928 Other abnormal and inconclusive findings on diagnostic imaging of breast: Secondary | ICD-10-CM

## 2012-01-25 ENCOUNTER — Encounter: Payer: Medicaid Other | Admitting: Obstetrics & Gynecology

## 2012-04-15 ENCOUNTER — Ambulatory Visit (INDEPENDENT_AMBULATORY_CARE_PROVIDER_SITE_OTHER): Payer: Medicaid Other | Admitting: Obstetrics & Gynecology

## 2012-04-15 ENCOUNTER — Encounter: Payer: Self-pay | Admitting: Obstetrics & Gynecology

## 2012-04-15 VITALS — Temp 97.2°F | Ht 69.0 in | Wt 257.9 lb

## 2012-04-15 DIAGNOSIS — N912 Amenorrhea, unspecified: Secondary | ICD-10-CM

## 2012-04-15 DIAGNOSIS — N911 Secondary amenorrhea: Secondary | ICD-10-CM

## 2012-04-15 LAB — POCT PREGNANCY, URINE: Preg Test, Ur: NEGATIVE

## 2012-04-15 NOTE — Progress Notes (Signed)
Subjective:     Patient ID: Amy Chaney, female   DOB: 09-10-1970, 41 y.o.   MRN: [id number]  HPI  Pt previously seen for DUB.  Her last visit was in Jan 2013 for review of Endobx.  Pt reports that she has not had any bleeding since that time.  No pain and no GYN complaints.    Review of Systems n/c    Objective:   Physical Exam  UPT: neg    Assessment:     DUB resolved.  No menses since Jan    Plan:     F/u 3 months or sooner prn resumption of bleeding   Delanee Xin L. Harraway-Smith, M.D., [facility]

## 2012-04-15 NOTE — Patient Instructions (Signed)
Menorrhagia   Menorrhagia is when a menstrual period is heavier or longer than normal.  HOME CARE   Only take medicine as told by your doctor.   Do not take aspirin 1 week before or during your period. Aspirin can make the bleeding worse.   Lay down for a while if you change your tampon or pad more than once in 2 hours. This may help lessen the bleeding.   Take any iron pills as told by your doctor. Heavy bleeding may cause you to lack iron in your body.   Eat a healthy diet and foods with iron. These foods include leafy green vegetables, meat, liver, eggs, and whole grain breads and cereals.   Eat foods that are high in vitamin C. These include oranges, orange juice, and grapefruits. Vitamin C can help your body take in more iron.   Do not try to lose weight. Wait until the heavy bleeding has stopped and your iron level is normal.  GET HELP RIGHT AWAY IF:   You get a fever.   You have trouble breathing.   You bleed even more heavily than usual and pass blood clots.   You feel dizzy, weak, or pass out (faint).   You need to change your tampon or pad more than once an hour.   You feel sick to your stomach (nauseous), throw up (vomit), or have watery poop (diarrhea).   You have problems from medicine.  MAKE SURE YOU:    Understand these instructions.   Will watch your condition.   Will get help right away if you are not doing well or get worse.  Document Released: 04/25/2008 Document Revised: 07/06/2011 Document Reviewed: 04/25/2008  [employer] Patient Information 2012 [employer]

## 2012-07-31 DIAGNOSIS — I2119 ST elevation (STEMI) myocardial infarction involving other coronary artery of inferior wall: Secondary | ICD-10-CM

## 2012-07-31 HISTORY — DX: ST elevation (STEMI) myocardial infarction involving other coronary artery of inferior wall: I21.19

## 2012-12-10 ENCOUNTER — Encounter: Payer: Self-pay | Admitting: Obstetrics

## 2013-01-28 DIAGNOSIS — I25119 Atherosclerotic heart disease of native coronary artery with unspecified angina pectoris: Secondary | ICD-10-CM

## 2013-01-28 HISTORY — DX: Atherosclerotic heart disease of native coronary artery with unspecified angina pectoris: I25.119

## 2013-02-20 ENCOUNTER — Encounter (HOSPITAL_COMMUNITY): Payer: Self-pay | Admitting: Pharmacy Technician

## 2013-02-25 ENCOUNTER — Encounter (HOSPITAL_COMMUNITY)
Admission: RE | Disposition: A | Discharge status: Home / Self Care | Payer: Self-pay | Source: Ambulatory Visit | Attending: Cardiology

## 2013-02-25 ENCOUNTER — Encounter (HOSPITAL_COMMUNITY): Payer: Self-pay | Admitting: General Practice

## 2013-02-25 ENCOUNTER — Ambulatory Visit (HOSPITAL_COMMUNITY)
Admission: RE | Admit: 2013-02-25 | Discharge: 2013-02-26 | Disposition: A | Discharge status: Home / Self Care | Payer: Medicaid Other | Source: Ambulatory Visit | Attending: Cardiology | Admitting: Cardiology

## 2013-02-25 DIAGNOSIS — I2510 Atherosclerotic heart disease of native coronary artery without angina pectoris: Secondary | ICD-10-CM | POA: Insufficient documentation

## 2013-02-25 DIAGNOSIS — Z23 Encounter for immunization: Secondary | ICD-10-CM | POA: Insufficient documentation

## 2013-02-25 DIAGNOSIS — R0789 Other chest pain: Secondary | ICD-10-CM

## 2013-02-25 DIAGNOSIS — IMO0001 Reserved for inherently not codable concepts without codable children: Secondary | ICD-10-CM | POA: Insufficient documentation

## 2013-02-25 DIAGNOSIS — I2582 Chronic total occlusion of coronary artery: Secondary | ICD-10-CM | POA: Insufficient documentation

## 2013-02-25 DIAGNOSIS — E785 Hyperlipidemia, unspecified: Secondary | ICD-10-CM | POA: Insufficient documentation

## 2013-02-25 DIAGNOSIS — I10 Essential (primary) hypertension: Secondary | ICD-10-CM

## 2013-02-25 DIAGNOSIS — Z955 Presence of coronary angioplasty implant and graft: Secondary | ICD-10-CM

## 2013-02-25 DIAGNOSIS — Z79899 Other long term (current) drug therapy: Secondary | ICD-10-CM | POA: Insufficient documentation

## 2013-02-25 HISTORY — DX: Type 2 diabetes mellitus without complications: E11.9

## 2013-02-25 HISTORY — DX: Headache: R51

## 2013-02-25 HISTORY — DX: Depression, unspecified: F32.A

## 2013-02-25 HISTORY — DX: Major depressive disorder, single episode, unspecified: F32.9

## 2013-02-25 HISTORY — PX: LEFT HEART CATHETERIZATION WITH CORONARY ANGIOGRAM: SHX5451

## 2013-02-25 HISTORY — PX: PERCUTANEOUS CORONARY STENT INTERVENTION (PCI-S): SHX6016

## 2013-02-25 HISTORY — DX: Headache, unspecified: R51.9

## 2013-02-25 LAB — POCT I-STAT 3, ART BLOOD GAS (G3+)
Bicarbonate: 24.7 mEq/L — ABNORMAL HIGH (ref 20.0–24.0)
TCO2: 26 mmol/L (ref 0–100)
pCO2 arterial: 40.6 mmHg (ref 35.0–45.0)
pH, Arterial: 7.393 (ref 7.350–7.450)

## 2013-02-25 LAB — GLUCOSE, CAPILLARY: Glucose-Capillary: 204 mg/dL — ABNORMAL HIGH (ref 70–99)

## 2013-02-25 LAB — POCT ACTIVATED CLOTTING TIME: Activated Clotting Time: 380 seconds

## 2013-02-25 SURGERY — LEFT HEART CATHETERIZATION WITH CORONARY ANGIOGRAM
Anesthesia: LOCAL

## 2013-02-25 MED ORDER — VILAZODONE HCL 20 MG PO TABS: 20.0000 mg | ORAL_TABLET | ORAL | Status: DC

## 2013-02-25 MED ORDER — HYDRALAZINE HCL 20 MG/ML IJ SOLN
10.0000 mg | Freq: Once | INTRAMUSCULAR | Status: AC
  Administered 2013-02-25: 16:00:00 10 mg via INTRAVENOUS
  Filled 2013-02-25: qty 1

## 2013-02-25 MED ORDER — SODIUM CHLORIDE 0.9 % IJ SOLN: 3.0000 mL | INTRAMUSCULAR | Status: DC | PRN

## 2013-02-25 MED ORDER — MIDAZOLAM HCL 2 MG/2ML IJ SOLN
INTRAMUSCULAR | Status: AC
  Filled 2013-02-25: qty 2

## 2013-02-25 MED ORDER — HYDROMORPHONE HCL PF 2 MG/ML IJ SOLN
INTRAMUSCULAR | Status: AC
  Filled 2013-02-25: qty 1

## 2013-02-25 MED ORDER — ASPIRIN 81 MG PO CHEW
324.0000 mg | CHEWABLE_TABLET | ORAL | Status: AC
  Administered 2013-02-25: 324 mg via ORAL
  Filled 2013-02-25: qty 4

## 2013-02-25 MED ORDER — ATORVASTATIN CALCIUM 10 MG PO TABS
10.0000 mg | ORAL_TABLET | Freq: Every day | ORAL | Status: DC
  Administered 2013-02-25 – 2013-02-26 (×2): 10 mg via ORAL
  Filled 2013-02-25 (×2): qty 1

## 2013-02-25 MED ORDER — SODIUM CHLORIDE 0.9 % IJ SOLN: 3.0000 mL | Freq: Two times a day (BID) | INTRAMUSCULAR | Status: DC

## 2013-02-25 MED ORDER — SODIUM CHLORIDE 0.9 % IV SOLN: 250.0000 mL | INTRAVENOUS | Status: DC | PRN

## 2013-02-25 MED ORDER — LISINOPRIL 20 MG PO TABS
20.0000 mg | ORAL_TABLET | Freq: Every day | ORAL | Status: DC
  Administered 2013-02-25 – 2013-02-26 (×2): 20 mg via ORAL
  Filled 2013-02-25 (×2): qty 1

## 2013-02-25 MED ORDER — VERAPAMIL HCL 2.5 MG/ML IV SOLN
INTRAVENOUS | Status: AC
  Filled 2013-02-25: qty 2

## 2013-02-25 MED ORDER — IRBESARTAN 300 MG PO TABS
300.0000 mg | ORAL_TABLET | Freq: Every day | ORAL | Status: DC
  Administered 2013-02-25 – 2013-02-26 (×2): 300 mg via ORAL
  Filled 2013-02-25 (×2): qty 1

## 2013-02-25 MED ORDER — PNEUMOCOCCAL VAC POLYVALENT 25 MCG/0.5ML IJ INJ
0.5000 mL | INJECTION | INTRAMUSCULAR | Status: AC
  Administered 2013-02-26: 0.5 mL via INTRAMUSCULAR
  Filled 2013-02-25: qty 0.5

## 2013-02-25 MED ORDER — HYDROCHLOROTHIAZIDE 25 MG PO TABS
25.0000 mg | ORAL_TABLET | Freq: Every day | ORAL | Status: DC
  Administered 2013-02-25 – 2013-02-26 (×2): 25 mg via ORAL
  Filled 2013-02-25 (×2): qty 1

## 2013-02-25 MED ORDER — VILAZODONE HCL 40 MG PO TABS
40.0000 mg | ORAL_TABLET | Freq: Every day | ORAL | Status: DC
  Filled 2013-02-25: qty 1

## 2013-02-25 MED ORDER — DIAZEPAM 5 MG PO TABS
ORAL_TABLET | ORAL | Status: AC
  Filled 2013-02-25: qty 1

## 2013-02-25 MED ORDER — INSULIN ASPART 100 UNIT/ML ~~LOC~~ SOLN: 0.0000 [IU] | Freq: Every day | SUBCUTANEOUS | Status: DC

## 2013-02-25 MED ORDER — LIDOCAINE HCL (PF) 1 % IJ SOLN
INTRAMUSCULAR | Status: AC
  Filled 2013-02-25: qty 30

## 2013-02-25 MED ORDER — BIVALIRUDIN 250 MG IV SOLR
INTRAVENOUS | Status: AC
  Filled 2013-02-25: qty 250

## 2013-02-25 MED ORDER — AMLODIPINE BESYLATE 5 MG PO TABS
5.0000 mg | ORAL_TABLET | Freq: Every day | ORAL | Status: DC
  Administered 2013-02-25 – 2013-02-26 (×2): 5 mg via ORAL
  Filled 2013-02-25: qty 1

## 2013-02-25 MED ORDER — HYDRALAZINE HCL 25 MG PO TABS
25.0000 mg | ORAL_TABLET | Freq: Four times a day (QID) | ORAL | Status: DC
  Administered 2013-02-25 – 2013-02-26 (×3): 25 mg via ORAL
  Filled 2013-02-25 (×7): qty 1

## 2013-02-25 MED ORDER — HEPARIN SODIUM (PORCINE) 1000 UNIT/ML IJ SOLN
INTRAMUSCULAR | Status: AC
  Filled 2013-02-25: qty 1

## 2013-02-25 MED ORDER — VILAZODONE HCL 40 MG PO TABS: 40.0000 mg | ORAL_TABLET | ORAL | Status: DC

## 2013-02-25 MED ORDER — ONDANSETRON HCL 4 MG/2ML IJ SOLN
4.0000 mg | Freq: Four times a day (QID) | INTRAMUSCULAR | Status: DC | PRN
  Administered 2013-02-25: 20:00:00 4 mg via INTRAVENOUS
  Filled 2013-02-25: qty 2

## 2013-02-25 MED ORDER — ATENOLOL 100 MG PO TABS
100.0000 mg | ORAL_TABLET | Freq: Every day | ORAL | Status: DC
  Administered 2013-02-25 – 2013-02-26 (×2): 100 mg via ORAL
  Filled 2013-02-25 (×2): qty 1

## 2013-02-25 MED ORDER — ASPIRIN EC 81 MG PO TBEC
81.0000 mg | DELAYED_RELEASE_TABLET | Freq: Every day | ORAL | Status: DC
  Administered 2013-02-26: 10:00:00 81 mg via ORAL
  Filled 2013-02-25 (×2): qty 1

## 2013-02-25 MED ORDER — SODIUM CHLORIDE 0.9 % IV SOLN
1.7500 mg/kg/h | INTRAVENOUS | Status: AC
  Administered 2013-02-25: 1.75 mg/kg/h via INTRAVENOUS
  Filled 2013-02-25: qty 250

## 2013-02-25 MED ORDER — NITROGLYCERIN 0.4 MG SL SUBL: 0.4000 mg | SUBLINGUAL_TABLET | SUBLINGUAL | Status: DC | PRN

## 2013-02-25 MED ORDER — PRASUGREL HCL 10 MG PO TABS
10.0000 mg | ORAL_TABLET | Freq: Every day | ORAL | Status: DC
  Administered 2013-02-26: 10:00:00 10 mg via ORAL
  Filled 2013-02-25 (×2): qty 1

## 2013-02-25 MED ORDER — SODIUM CHLORIDE 0.9 % IV SOLN
1.0000 mL/kg/h | INTRAVENOUS | Status: AC
  Administered 2013-02-25: 1 mL/kg/h via INTRAVENOUS

## 2013-02-25 MED ORDER — PRASUGREL HCL 10 MG PO TABS
ORAL_TABLET | ORAL | Status: AC
  Filled 2013-02-25: qty 6

## 2013-02-25 MED ORDER — SODIUM CHLORIDE 0.9 % IV SOLN
INTRAVENOUS | Status: DC
  Administered 2013-02-25: 08:00:00 via INTRAVENOUS

## 2013-02-25 MED ORDER — INSULIN ASPART 100 UNIT/ML ~~LOC~~ SOLN
0.0000 [IU] | Freq: Three times a day (TID) | SUBCUTANEOUS | Status: DC
  Administered 2013-02-25: 18:00:00 11 [IU] via SUBCUTANEOUS
  Administered 2013-02-26: 09:00:00 5 [IU] via SUBCUTANEOUS

## 2013-02-25 MED ORDER — AMLODIPINE BESYLATE 5 MG PO TABS
ORAL_TABLET | ORAL | Status: AC
  Filled 2013-02-25: qty 1

## 2013-02-25 MED ORDER — HEPARIN (PORCINE) IN NACL 2-0.9 UNIT/ML-% IJ SOLN
INTRAMUSCULAR | Status: AC
  Filled 2013-02-25: qty 1000

## 2013-02-25 MED ORDER — NITROGLYCERIN 0.2 MG/ML ON CALL CATH LAB
INTRAVENOUS | Status: AC
  Filled 2013-02-25: qty 1

## 2013-02-25 MED ORDER — ACETAMINOPHEN 325 MG PO TABS
650.0000 mg | ORAL_TABLET | ORAL | Status: DC | PRN
  Administered 2013-02-25 – 2013-02-26 (×2): 650 mg via ORAL
  Filled 2013-02-25 (×2): qty 2

## 2013-02-25 MED ORDER — VILAZODONE HCL 20 MG PO TABS
40.0000 mg | ORAL_TABLET | Freq: Every day | ORAL | Status: DC
  Administered 2013-02-25 – 2013-02-26 (×2): 40 mg via ORAL
  Filled 2013-02-25 (×2): qty 2

## 2013-02-25 NOTE — Progress Notes (Signed)
TR BAND REMOVAL  LOCATION:    right radial  DEFLATED PER PROTOCOL:    yes  TIME BAND OFF / DRESSING APPLIED:    1545   SITE UPON ARRIVAL:    Level 0  SITE AFTER BAND REMOVAL:    Level 0  REVERSE ALLEN'S TEST:     positive  CIRCULATION SENSATION AND MOVEMENT:    Within Normal Limits   yes  COMMENTS:   Tolerated procedure well 

## 2013-02-25 NOTE — H&P (Signed)
  Please see office visit notes for complete details of HPI.  

## 2013-02-25 NOTE — CV Procedure (Signed)
Procedure performed:  Left heart catheterization including hemodynamic monitoring of the left ventricle, LV gram. Selective right and left coronary arteriography. PTCA and stenting of the chronic total occlusion of proximal and mid RCA with implantation of a 3.0 x 38 mm in the midsegment overlapped proximally with a 3.5 x 16 mm promos Premier DES.  Indication: Patient is a 42 year-old AA female with history of hypertension,  hyperlipidemia,  Diabetes Mellitus   who presents with chest pain with exertion. Patient has  had non invasive testing which was abnormal with EKG changes and also inferior diaphragmatic attenuation. Due to persistent symptoms in spite of medical therapy,patient brought to the cardiac catheterization lab to evaluate  coronary anatomy for definitive diagnosis of CAD.  Hemodynamic data: Left ventricular pressure was 156/9 with LVEDP of 13 mm mercury. Aortic pressure was 151/82 with a mean of 110 mm mercury. There was no pressure gradient across the aortic valve.   Left ventricle: Performed in the RAO projection revealed LVEF of 50%. There was No MR. Inferior wall hypokinesis.  Right coronary artery: Dominant. There is a long segment chronic total occlusion with ipsilateral bridging collaterals. Is a very large vessel giving origin to large PL PD branches. The mid to distal segment of the RCA is smooth with very mild luminal irregularity.  Left main coronary artery is large and normal.  Circumflex coronary artery: A large vessel giving origin to a large high obtuse marginal 1. It also gives origin to a large OM 2 and continues in the AV groove, smooth and normal.  LAD:  LAD gives origin to a large diagonal-1 and D2.  LAD has very mild luminal irregularities.   Impression: Single-vessel coronary artery disease, CTO of the proximal and mid RCA with bridging collaterals. The lesion appears to be fairly straightforward and amenable for percutaneous revascularization. Will plan on  proceeding with intervention, if I have difficulty in crossing the lesion, will schedule her for repeat elective angioplasty of the CTO of the proximal and mid RCA.  Interventional data: Successful PTCA and stenting of the proximal and midsegment of the dominant RCA with implantation of a 3.0 x 38 mm in the midsegment overlapped proximally with a 3.5 x 16 mm promos Premier DES. Will need Dual antiplatelet therapy with Effient and ASA 81 mg for at least one year.   Technique of diagnostic cardiac catheterization:  Under sterile precautions using a 6 French right radial  arterial access, a 6 French sheath was introduced into the right radial artery. A 5 [place] Tig 4 catheter was advanced into the ascending aorta selective  right coronary artery and left coronary artery was cannulated and angiography was performed in multiple views. The catheter was pulled back Out of the body over exchange length J-wire.  5 [place] pig Catheter was used to perform LV gram which was performed in RAO projection. Catheter exchanged out of the body over J-Wire. NO immediate complications noted.  Technique of intervention:  Using a 6 [place] Ikari 1.0 guide catheter the RCA  coronary  was selected and cannulated. Using Angiomax for anticoagulation, I utilized a BMW guidewire followed by  [place] Fairy Brothers 6 gm guidewire and across the CTO with the help of a fine cross catheter, and also with the help of a 1.20 x 6 mm mini Trek balloon. The distal of the wire easily crossed and was placed in the distal RCA. Obviously it was still a complex lesion but was able to wire this with moderate amount of difficulty.  This was followed by balloon angioplasty of the mid and proximal segment at 14 atmospheric pressure for 30 seconds each throughout the lesion. Having performed this I then utilized a 2.0 x 15 mm maverick over the wire balloon and I extended the miracle Brothers 6 wire and then I was able to advance the over the wire balloon  distally into the RCA and the intraluminal position confirmed by injection of the contrast through the balloon. I then exchanged the miracle Brothers to a 300 cm 0.014 inch Luge wire. I then performed balloon angioplasty with a 2.0 x 15 mm balloon throughout the CTO. This was followed by sequential stenting in the mid to proximal segment followed by proximal segment with a 3.0 x 38 mm and a 3.5 x 16 mm promos Premier drug-eluting stent which was deployed at 12 atmospheric pressure for 60 seconds and 14 atmospheric pressure for 60 seconds respectively. Then I utilized a 3.5 mm stent balloon to perform angioplasty at the site of overlap and 12 atmospheric pressure for 30 seconds. I postdilated the ostium of the stent with a 3.75 x 12 mm [place] treck at 16 atmospheric pressure for 30 seconds followed by angiography. Intracoronary nitroglycerin was administered throughout the procedure. Excellent result was evident without any edge dissection. The guidewire was withdrawn guide catheter disengaged and pulled out of the body. Patient tolerated the procedure well. Hemostasis was obtained by applying TR band. Disposition: Patient will be discharged in morning  unless complications with out-patient follow up.

## 2013-02-25 NOTE — Interval H&P Note (Signed)
History and Physical Interval Note:  02/25/2013 10:04 AM  Amy Chaney  has presented today for surgery, with the diagnosis of Chest pain  The various methods of treatment have been discussed with the patient and family. After consideration of risks, benefits and other options for treatment, the patient has consented to  Procedure(s): LEFT HEART CATHETERIZATION WITH CORONARY ANGIOGRAM (N/A) as a surgical intervention.  The patient's history has been reviewed, patient examined, no change in status, stable for surgery.  I have reviewed the patient's chart and labs.  Questions were answered to the patient's satisfaction.   Cath Lab Visit (complete for each Cath Lab visit)  Clinical Evaluation Leading to the Procedure:   ACS: no  Non-ACS:    Anginal Classification: CCS III  Anti-ischemic medical therapy: Maximal Therapy (2 or more classes of medications)  Non-Invasive Test Results: Intermediate-risk stress test findings: cardiac mortality 1-3%/year  Prior CABG: No previous CABG        [facility]

## 2013-02-26 LAB — BASIC METABOLIC PANEL
BUN: 10 mg/dL (ref 6–23)
CO2: 25 mEq/L (ref 19–32)
Chloride: 102 mEq/L (ref 96–112)
GFR calc non Af Amer: >90 mL/min (ref >90)
Glucose, Bld: 221 mg/dL — ABNORMAL HIGH (ref 70–99)
Potassium: 3.6 mEq/L (ref 3.5–5.1)
Sodium: 137 mEq/L (ref 135–145)

## 2013-02-26 LAB — CBC
HCT: 35.4 % — ABNORMAL LOW (ref 36.0–46.0)
Hemoglobin: 11.6 g/dL — ABNORMAL LOW (ref 12.0–15.0)
RBC: 4.45 MIL/uL (ref 3.87–5.11)
WBC: 14.8 10*3/uL — ABNORMAL HIGH (ref 4.0–10.5)

## 2013-02-26 LAB — HEMOGLOBIN A1C: Mean Plasma Glucose: 232 mg/dL — ABNORMAL HIGH (ref <117)

## 2013-02-26 LAB — GLUCOSE, CAPILLARY
Glucose-Capillary: 184 mg/dL — ABNORMAL HIGH (ref 70–99)
Glucose-Capillary: 226 mg/dL — ABNORMAL HIGH (ref 70–99)

## 2013-02-26 MED ORDER — AMLODIPINE BESYLATE-VALSARTAN 5-160 MG PO TABS: 2.0000 | ORAL_TABLET | Freq: Every day | ORAL | Status: DC

## 2013-02-26 MED ORDER — HYDRALAZINE HCL 25 MG PO TABS: 25.0000 mg | ORAL_TABLET | Freq: Four times a day (QID) | ORAL | Status: DC

## 2013-02-26 MED ORDER — PRASUGREL HCL 10 MG PO TABS: 10.0000 mg | ORAL_TABLET | Freq: Every day | ORAL | Status: DC

## 2013-02-26 MED FILL — Sodium Chloride IV Soln 0.9%: INTRAVENOUS | Qty: 50 | Status: AC

## 2013-02-26 NOTE — Discharge Summary (Signed)
Physician Discharge Summary  Patient ID: Amy Chaney MRN: [id number] DOB/AGE: 1971-02-07 42 y.o.  Admit date: 02/25/2013 Discharge date: 02/26/2013  Primary Discharge Diagnosis Coronary artery disease status post angioplasty to the right coronary artery, chronic total occlusion, successful PTCA and stenting of the proximal and midsegment of the dominant RCA with implantation of a 3.0 x 38 mm in the midsegment overlapped proximally with a 3.5 x 16 mm promos Premier DES.  Secondary Discharge Diagnosis Diabetes mellitus type 2 uncontrolled Hypertension Morbid obesity  Significant Diagnostic Studies: 02/25/2013: Hemodynamic data:  Left ventricular pressure was 156/9 with LVEDP of 13 mm mercury. Aortic pressure was 151/82 with a mean of 110 mm mercury. There was no pressure gradient across the aortic valve.  Left ventricle: Performed in the RAO projection revealed LVEF of 50%. There was No MR. Inferior wall hypokinesis.  Right coronary artery: Dominant. There is a long segment chronic total occlusion with ipsilateral bridging collaterals. Is a very large vessel giving origin to large PL PD branches. The mid to distal segment of the RCA is smooth with very mild luminal irregularity.  Left main coronary artery is large and normal.  Circumflex coronary artery: A large vessel giving origin to a large high obtuse marginal 1. It also gives origin to a large OM 2 and continues in the AV groove, smooth and normal.  LAD: LAD gives origin to a large diagonal-1 and D2. LAD has very mild luminal irregularities.  Impression: Single-vessel coronary artery disease, CTO of the proximal and mid RCA with bridging collaterals. The lesion appears to be fairly straightforward and amenable for percutaneous revascularization.  Will plan on proceeding with intervention, if I have difficulty in crossing the lesion, will schedule her for repeat elective angioplasty of the CTO of the proximal and mid RCA.  Interventional  data: Successful PTCA and stenting of the proximal and midsegment of the dominant RCA with implantation of a 3.0 x 38 mm in the midsegment overlapped proximally with a 3.5 x 16 mm promos Premier DES.  Hospital Course: Patient admitted to the hospital on the outpatient basis for elective coronary angiography due to worsening chest pain and persistent chest pain and abnormal EKG response to stress test. Patient was found to have chronic total occlusion of the proximal RCA and underwent successful revascularization without any complication. While she was being observed in the telemetry, she did have a headache due to nitroglycerin, had one episode of emesis last night leading to bradycardia, transient hypotension which resolved immediately. Otherwise no other complications, patient tolerated the procedure well and on the day of discharge patient was essentially asymptomatic and I'm bleeding without any complaints. Felt stable for discharge. I have had a lengthy discussion with the patient regarding diabetic education, avoidance of fruit juices, sugar which would and also potatoes. I will continue to reinforce lifestyle changes to be done in the outpatient basis.   Recommendations on discharge: Patient will need aspirin indefinitely and Effient at least for a period of a year.  Discharge Exam: Blood pressure 156/75, pulse 73, temperature 98 F (36.7 C), temperature source Oral, resp. rate 18, height 5\' 9"  (1.753 m), weight 116.8 kg (257 lb 8 oz), last menstrual period 12/26/2012, SpO2 97.00%.   General appearance: alert, cooperative, appears older than stated age and no distress Resp: clear to auscultation bilaterally Cardio: regular rate and rhythm, S1, S2 normal, no murmur, click, rub or gallop GI: soft, non-tender; bowel sounds normal; no masses,  no organomegaly Extremities: extremities normal, atraumatic, no  cyanosis or edema Pulses: 2+ and symmetric Radial artery arterial access site without any  complication and good pulse. Neurologic: Grossly normal Labs:   Lab Results  Component Value Date   WBC 14.8* 02/26/2013   HGB 11.6* 02/26/2013   HCT 35.4* 02/26/2013   MCV 79.6 02/26/2013   PLT 324 02/26/2013    Recent Labs Lab 02/26/13 0556  NA 137  K 3.6  CL 102  CO2 25  BUN 10  CREATININE 0.64  CALCIUM 9.1  GLUCOSE 221*   No results found for this basename: CKTOTAL, CKMB, CKMBINDEX, TROPONINI    Lipid Panel     Component Value Date/Time   CHOL 157 09/14/2011 0900   TRIG 49.0 09/14/2011 0900   HDL 47.10 09/14/2011 0900   CHOLHDL 3 09/14/2011 0900   VLDL 9.8 09/14/2011 0900   LDLCALC 100* 09/14/2011 0900    EKG: unchanged from previous tracings, normal sinus rhythm, LVH by voltage prolonged QT otherwise no evidence of ischemia.    FOLLOW UP PLANS AND APPOINTMENTS  Future Appointments Provider Department Dept Phone   03/17/2013 2:30 PM Brock Bad, MD [facility] [phone]       Medication List    TAKE these medications       amLODipine-valsartan 5-160 MG per tablet  Commonly known as:  EXFORGE  Take 2 tablets by mouth daily.     aspirin EC 81 MG tablet  Take 81 mg by mouth daily.     atenolol 100 MG tablet  Commonly known as:  TENORMIN  Take 100 mg by mouth daily.     atorvastatin 10 MG tablet  Commonly known as:  LIPITOR  Take 10 mg by mouth daily.     GOODY HEADACHE PO  Take 1 packet by mouth daily as needed (pain).     hydrALAZINE 25 MG tablet  Commonly known as:  APRESOLINE  Take 1 tablet (25 mg total) by mouth 4 (four) times daily.     hydrochlorothiazide 25 MG tablet  Commonly known as:  HYDRODIURIL  Take 1 tablet (25 mg total) by mouth daily.     lisinopril 20 MG tablet  Commonly known as:  PRINIVIL,ZESTRIL  Take 20 mg by mouth daily.     metFORMIN 500 MG tablet  Commonly known as:  GLUCOPHAGE  Take 1,000 mg by mouth 2 (two) times daily with a meal.     nitroGLYCERIN 0.4 MG SL tablet  Commonly known as:  NITROSTAT   Place 0.4 mg under the tongue every 5 (five) minutes as needed for chest pain.     prasugrel 10 MG Tabs  Commonly known as:  EFFIENT  Take 1 tablet (10 mg total) by mouth daily.      ASK your doctor about these medications       VIIBRYD 10 MG Tabs  Generic drug:  Vilazodone HCl  Take 10 mg by mouth See admin instructions. 7 day course started 02/10/13 completed 02/16/13  Ask about: Which instructions should I use?     VIIBRYD 20 MG Tabs  Generic drug:  Vilazodone HCl  Take 20 mg by mouth See admin instructions. 7 day course started 02/17/13 (takes daily)  Ask about: Which instructions should I use?     VIIBRYD 40 MG Tabs  Generic drug:  Vilazodone HCl  Take 40 mg by mouth See admin instructions. 14 day course to start 02/24/13 (once daily)  Ask about: Which instructions should I use?  Pamella Pert, MD 02/26/2013, 8:47 AM  Pager: [phone] Office: [phone] If no answer: [phone]

## 2013-02-26 NOTE — Care Management Note (Addendum)
  Page 1 of 1   02/26/2013     10:55:06 AM   CARE MANAGEMENT NOTE 02/26/2013  Patient:  Amy Chaney, Amy Chaney   Account Number:  [id number]  Date Initiated:  02/26/2013  Documentation initiated by:  [facility]  Subjective/Objective Assessment:   42 yo admitted with chest pain//home with children     Action/Plan:   heart cath//benefits check for Effient 10mg    Anticipated DC Date:  02/26/2013   Anticipated DC Plan:  HOME/SELF CARE      DC Planning Services  CM consult      Choice offered to / List presented to:             Status of service:  Completed, signed off Medicare Important Message given?   (If response is "NO", the following Medicare IM given date fields will be blank) Date Medicare IM given:   Date Additional Medicare IM given:    Discharge Disposition:    Per UR Regulation:  Reviewed for med. necessity/level of care/duration of stay  If discussed at Long Length of Stay Meetings, dates discussed:    Comments:  02/26/13 @ 1030.Marland KitchenMarland KitchenOletta Cohn, RN, BSN, [employer] [phone] Spoke with pt at bedside regarding benefits check for Effient medication.    Pt utilizes [facility] for prescription needs.  NCM called pharmacy to verify medication in stock.  Pt has Effient brochure with 30day free card and prescription refill card intact.  *Pt has Medicaid which requires prior approval [phone]).  Pt co-pay will be $3.00 if approved.

## 2013-02-26 NOTE — Progress Notes (Signed)
CARDIAC REHAB PHASE I   PRE:  Rate/Rhythm: 74SR  BP:  Supine:   Sitting: 142/79  Standing:    SaO2:   MODE:  Ambulation: 500 ft   POST:  Rate/Rhythm: 83SR  BP:  Supine:   Sitting: 165/69  Standing:    SaO2:  0845-0940 Pt walked 500 ft on RA with steady gait. Denied CP. Tolerated well. Education completed. Pt is under stress living with her sister, working and watching grandchildren.  Discussed with pt taking care of herself. Discussed smoking cessation and handouts given. Family members smoke also. Gave diabetic diet and heart healthy diet. Pt has counted carbs before so we discussed this. Permission given to refer to [facility] Phase 2. Has effient packet.   Luetta Nutting, RN BSN  02/26/2013 9:38 AM

## 2013-03-17 ENCOUNTER — Ambulatory Visit: Payer: Self-pay | Admitting: Obstetrics

## 2013-03-31 ENCOUNTER — Inpatient Hospital Stay (HOSPITAL_COMMUNITY)
Admission: EM | Admit: 2013-03-31 | Discharge: 2013-04-02 | DRG: 247 | Disposition: A | Discharge status: Home / Self Care | Payer: Medicaid Other | Attending: Internal Medicine | Admitting: Internal Medicine

## 2013-03-31 ENCOUNTER — Emergency Department (HOSPITAL_COMMUNITY): Payer: Medicaid Other

## 2013-03-31 ENCOUNTER — Encounter (HOSPITAL_COMMUNITY): Payer: Self-pay

## 2013-03-31 DIAGNOSIS — I10 Essential (primary) hypertension: Secondary | ICD-10-CM

## 2013-03-31 DIAGNOSIS — Z9119 Patient's noncompliance with other medical treatment and regimen: Secondary | ICD-10-CM

## 2013-03-31 DIAGNOSIS — F1210 Cannabis abuse, uncomplicated: Secondary | ICD-10-CM | POA: Diagnosis present

## 2013-03-31 DIAGNOSIS — E871 Hypo-osmolality and hyponatremia: Secondary | ICD-10-CM

## 2013-03-31 DIAGNOSIS — IMO0001 Reserved for inherently not codable concepts without codable children: Secondary | ICD-10-CM | POA: Diagnosis present

## 2013-03-31 DIAGNOSIS — F329 Major depressive disorder, single episode, unspecified: Secondary | ICD-10-CM | POA: Diagnosis present

## 2013-03-31 DIAGNOSIS — Z955 Presence of coronary angioplasty implant and graft: Secondary | ICD-10-CM

## 2013-03-31 DIAGNOSIS — Z7902 Long term (current) use of antithrombotics/antiplatelets: Secondary | ICD-10-CM

## 2013-03-31 DIAGNOSIS — E119 Type 2 diabetes mellitus without complications: Secondary | ICD-10-CM

## 2013-03-31 DIAGNOSIS — I2510 Atherosclerotic heart disease of native coronary artery without angina pectoris: Secondary | ICD-10-CM | POA: Diagnosis present

## 2013-03-31 DIAGNOSIS — T82897A Other specified complication of cardiac prosthetic devices, implants and grafts, initial encounter: Secondary | ICD-10-CM | POA: Diagnosis present

## 2013-03-31 DIAGNOSIS — Z91199 Patient's noncompliance with other medical treatment and regimen due to unspecified reason: Secondary | ICD-10-CM

## 2013-03-31 DIAGNOSIS — F411 Generalized anxiety disorder: Secondary | ICD-10-CM | POA: Diagnosis present

## 2013-03-31 DIAGNOSIS — R0989 Other specified symptoms and signs involving the circulatory and respiratory systems: Secondary | ICD-10-CM

## 2013-03-31 DIAGNOSIS — I110 Hypertensive heart disease with heart failure: Secondary | ICD-10-CM | POA: Diagnosis present

## 2013-03-31 DIAGNOSIS — I214 Non-ST elevation (NSTEMI) myocardial infarction: Principal | ICD-10-CM

## 2013-03-31 DIAGNOSIS — K219 Gastro-esophageal reflux disease without esophagitis: Secondary | ICD-10-CM | POA: Diagnosis present

## 2013-03-31 DIAGNOSIS — Z7982 Long term (current) use of aspirin: Secondary | ICD-10-CM

## 2013-03-31 DIAGNOSIS — Y831 Surgical operation with implant of artificial internal device as the cause of abnormal reaction of the patient, or of later complication, without mention of misadventure at the time of the procedure: Secondary | ICD-10-CM | POA: Diagnosis present

## 2013-03-31 DIAGNOSIS — F3289 Other specified depressive episodes: Secondary | ICD-10-CM | POA: Diagnosis present

## 2013-03-31 DIAGNOSIS — I2582 Chronic total occlusion of coronary artery: Secondary | ICD-10-CM | POA: Diagnosis present

## 2013-03-31 DIAGNOSIS — R0789 Other chest pain: Secondary | ICD-10-CM

## 2013-03-31 DIAGNOSIS — Z9089 Acquired absence of other organs: Secondary | ICD-10-CM

## 2013-03-31 DIAGNOSIS — F17200 Nicotine dependence, unspecified, uncomplicated: Secondary | ICD-10-CM | POA: Diagnosis present

## 2013-03-31 DIAGNOSIS — E1169 Type 2 diabetes mellitus with other specified complication: Secondary | ICD-10-CM | POA: Diagnosis present

## 2013-03-31 DIAGNOSIS — E7800 Pure hypercholesterolemia, unspecified: Secondary | ICD-10-CM

## 2013-03-31 DIAGNOSIS — Z833 Family history of diabetes mellitus: Secondary | ICD-10-CM

## 2013-03-31 DIAGNOSIS — F439 Reaction to severe stress, unspecified: Secondary | ICD-10-CM

## 2013-03-31 DIAGNOSIS — I498 Other specified cardiac arrhythmias: Secondary | ICD-10-CM | POA: Diagnosis present

## 2013-03-31 DIAGNOSIS — IMO0002 Reserved for concepts with insufficient information to code with codable children: Secondary | ICD-10-CM | POA: Diagnosis present

## 2013-03-31 DIAGNOSIS — E785 Hyperlipidemia, unspecified: Secondary | ICD-10-CM | POA: Diagnosis present

## 2013-03-31 DIAGNOSIS — I5032 Chronic diastolic (congestive) heart failure: Secondary | ICD-10-CM | POA: Diagnosis present

## 2013-03-31 DIAGNOSIS — Z79899 Other long term (current) drug therapy: Secondary | ICD-10-CM

## 2013-03-31 DIAGNOSIS — R079 Chest pain, unspecified: Secondary | ICD-10-CM

## 2013-03-31 DIAGNOSIS — Z9861 Coronary angioplasty status: Secondary | ICD-10-CM

## 2013-03-31 DIAGNOSIS — F32A Depression, unspecified: Secondary | ICD-10-CM

## 2013-03-31 DIAGNOSIS — E1165 Type 2 diabetes mellitus with hyperglycemia: Secondary | ICD-10-CM | POA: Diagnosis present

## 2013-03-31 DIAGNOSIS — Z9851 Tubal ligation status: Secondary | ICD-10-CM

## 2013-03-31 DIAGNOSIS — Z638 Other specified problems related to primary support group: Secondary | ICD-10-CM

## 2013-03-31 DIAGNOSIS — Z6838 Body mass index (BMI) 38.0-38.9, adult: Secondary | ICD-10-CM

## 2013-03-31 LAB — POCT I-STAT TROPONIN I: Troponin i, poc: 0.03 ng/mL (ref 0.00–0.08)

## 2013-03-31 NOTE — ED Notes (Signed)
Portable CXR at bedside.

## 2013-03-31 NOTE — ED Notes (Signed)
Patient from home via [facility]; c/o nonradiating, substernal CP since 2030 tonight. Also had some SOB and vomiting x 2. No nausea. No diaphoresis. Rates 10/10. Took ASA 324 mg and Nitro x 3 at home. Was also given Nitro x 1 per EMS. Denies any pain at arrival. Hypertensive per EMS 190/80. 12 lead unremarkable. 20g LAC-SL. Hx stent placement x 2 in June 2014. No reported complications. Pt AAOx4.

## 2013-03-31 NOTE — ED Notes (Signed)
Phlebotomy at bedside.

## 2013-03-31 NOTE — ED Notes (Signed)
MD at bedside. 

## 2013-03-31 NOTE — ED Provider Notes (Signed)
CSN: [id number]     Arrival date & time 03/31/13  2305 History   First MD Initiated Contact with Patient 03/31/13 2341     Chief Complaint  Patient presents with  . Chest Pain   (Consider location/radiation/quality/duration/timing/severity/associated sxs/prior Treatment) Patient is a 42 y.o. female presenting with chest pain. The history is provided by the patient.  Chest Pain She had onset at about 8:30 PM of severe lower sternal chest pain with some radiation to the left arm. Pain is sharp and she rated at 7/10. Nothing made it worse but there was slight relief with nitroglycerin. There is associated dyspnea, nausea, vomiting, diaphoresis. She took 3 nitroglycerin and EMS gave her an additional nitroglycerin. She also took aspirin 324 mg. She has a history of cardiac stent placement but states she's never had pain like this before.  Past Medical History  Diagnosis Date  . Hypertension   . Coronary artery disease   . High cholesterol   . Heart murmur   . Chest pain at rest     "and w/exertion; last couple months" (02/25/2013)  . Shortness of breath     "at rest and w/exertion last couple months" (02/25/2013)  . Type II diabetes mellitus 12/2010  . Daily headache   . Depression    Past Surgical History  Procedure Laterality Date  . Coronary angioplasty with stent placement  02/25/2013    "2" (02/25/2013)  . Cholecystectomy  ~ 2000  . Tubal ligation  1994   Family History  Problem Relation Age of Onset  . Diabetes Mother   . Diabetes Sister   . Diabetes Son     type 1  . Diabetes Sister   . Diabetes Son     type 2   History  Substance Use Topics  . Smoking status: Current Every Day Smoker -- 1.00 packs/day for 15 years    Types: Cigarettes  . Smokeless tobacco: Never Used  . Alcohol Use: Yes     Comment: 02/25/2013 "only on special occasions; maybe once/yr"   OB History   Grav Para Term Preterm Abortions TAB SAB Ect Mult Living   3 3 3  0 0 0 0 0 0 3     Review of  Systems  Cardiovascular: Positive for chest pain.  All other systems reviewed and are negative.    Allergies  Review of patient's allergies indicates no known allergies.  Home Medications   Current Outpatient Rx  Name  Route  Sig  Dispense  Refill  . amLODipine-valsartan (EXFORGE) 5-160 MG per tablet   Oral   Take 2 tablets by mouth daily.         Marland Kitchen aspirin EC 81 MG tablet   Oral   Take 81 mg by mouth daily.         . Aspirin-Acetaminophen-Caffeine (GOODY HEADACHE PO)   Oral   Take 1 packet by mouth daily as needed (pain).         Marland Kitchen atenolol (TENORMIN) 100 MG tablet   Oral   Take 100 mg by mouth daily.         Marland Kitchen atorvastatin (LIPITOR) 10 MG tablet   Oral   Take 10 mg by mouth daily.         . hydrALAZINE (APRESOLINE) 25 MG tablet   Oral   Take 1 tablet (25 mg total) by mouth 4 (four) times daily.   120 tablet   1   . hydrochlorothiazide (HYDRODIURIL) 25 MG tablet   Oral  Take 1 tablet (25 mg total) by mouth daily.   90 tablet   3   . lisinopril (PRINIVIL,ZESTRIL) 20 MG tablet   Oral   Take 20 mg by mouth daily.         . metFORMIN (GLUCOPHAGE) 500 MG tablet   Oral   Take 1,000 mg by mouth 2 (two) times daily with a meal.          . nitroGLYCERIN (NITROSTAT) 0.4 MG SL tablet   Sublingual   Place 0.4 mg under the tongue every 5 (five) minutes as needed for chest pain.         . prasugrel (EFFIENT) 10 MG TABS   Oral   Take 1 tablet (10 mg total) by mouth daily.   30 tablet   0     I will sent additional 11 refills after coupon use ...   . Vilazodone HCl (VIIBRYD) 10 MG TABS   Oral   Take 10 mg by mouth See admin instructions. 7 day course started 02/10/13 completed 02/16/13         . Vilazodone HCl (VIIBRYD) 20 MG TABS   Oral   Take 20 mg by mouth See admin instructions. 7 day course started 02/17/13 (takes daily)         . Vilazodone HCl (VIIBRYD) 40 MG TABS   Oral   Take 40 mg by mouth See admin instructions. 14 day course to  start 02/24/13 (once daily)          BP 158/77  Pulse 65  Temp(Src) 98.1 F (36.7 C) (Oral)  Resp 12  Ht 5\' 9"  (1.753 m)  Wt 260 lb (117.935 kg)  BMI 38.38 kg/m2  SpO2 100%  LMP 03/24/2013 Physical Exam  Nursing note and vitals reviewed.  42 year old female, who appears uncomfortable, but is in no acute distress. Vital signs are significant for hypertension with blood pressure 158/77. Oxygen saturation is 100%, which is normal. Head is normocephalic and atraumatic. PERRLA, EOMI. Oropharynx is clear. Neck is nontender and supple without adenopathy or JVD. Back is nontender and there is no CVA tenderness. Lungs are clear without rales, wheezes, or rhonchi. Chest is nontender. Heart has regular rate and rhythm without murmur. Abdomen is soft, flat, nontender without masses or hepatosplenomegaly and peristalsis is normoactive. Extremities have no cyanosis or edema, full range of motion is present. Skin is warm and dry without rash. Neurologic: Mental status is normal, cranial nerves are intact, there are no motor or sensory deficits.  ED Course  Procedures (including critical care time) Results for orders placed during the hospital encounter of 03/31/13  PRO B NATRIURETIC PEPTIDE      Result Value Range   Pro B Natriuretic peptide (BNP) 170.4 (*) 0 - 125 pg/mL  BASIC METABOLIC PANEL      Result Value Range   Sodium 133 (*) 135 - 145 mEq/L   Potassium 4.0  3.5 - 5.1 mEq/L   Chloride 99  96 - 112 mEq/L   CO2 21  19 - 32 mEq/L   Glucose, Bld 257 (*) 70 - 99 mg/dL   BUN 8  6 - 23 mg/dL   Creatinine, Ser 4.78  0.50 - 1.10 mg/dL   Calcium 9.0  8.4 - 29.5 mg/dL   GFR calc non Af Amer >90  >90 mL/min   GFR calc Af Amer >90  >90 mL/min  CBC      Result Value Range   WBC 12.5 (*) 4.0 -  10.5 K/uL   RBC 4.34  3.87 - 5.11 MIL/uL   Hemoglobin 12.0  12.0 - 15.0 g/dL   HCT 16.1 (*) 09.6 - 04.5 %   MCV 80.0  78.0 - 100.0 fL   MCH 27.6  26.0 - 34.0 pg   MCHC 34.6  30.0 - 36.0 g/dL    RDW 40.9  81.1 - 91.4 %   Platelets 260  150 - 400 K/uL  D-DIMER, QUANTITATIVE      Result Value Range   D-Dimer, Quant <0.27  0.00 - 0.48 ug/mL-FEU  POCT I-STAT TROPONIN I      Result Value Range   Troponin i, poc 0.03  0.00 - 0.08 ng/mL   Comment 3            Dg Chest Port 1 View  03/31/2013   *RADIOLOGY REPORT*  Clinical Data: Chest pain  PORTABLE CHEST - 1 VIEW  Comparison: None available  Findings: Cardiac and mediastinal silhouettes are within normal limits.  Lungs are normally inflated.  No airspace consolidation, pleural effusion, or pulmonary edema is identified.  There is no pneumothorax.  No acute osseous abnormality.  IMPRESSION: No acute cardiopulmonary process.   Original Report Authenticated By: Rise Mu, M.D.      Date: 03/31/2013  Rate: 71  Rhythm: normal sinus rhythm  QRS Axis: normal  Intervals: QT prolonged  ST/T Wave abnormalities: normal  Conduction Disutrbances:none  Narrative Interpretation: Borderline prolonged QT interval. When compared with ECG of 02/26/2013, QT has shortened.  Old EKG Reviewed: changes noted   MDM   1. Chest pain   2. Hyponatremia    Chest pain which seems somewhat atypical. Old records are reviewed and she had cardiac catheterization with stent placement on 02/25/2013. She had total occlusion of the RCA which appeared to are chronic and the catheterization was done because of dyspnea and fatigue and not chest pain. She did not have any significant disease outside of the occluded vessel. She'll be given aspirin for pain and anticipated need to monitor her for serial troponins. Case is discussed with Dr. Conley Rolls of [facility] who states he will see the patient in the ED.    Dione Booze, MD 04/01/13 [phone]

## 2013-04-01 ENCOUNTER — Encounter (HOSPITAL_COMMUNITY)
Admission: EM | Disposition: A | Discharge status: Home / Self Care | Payer: Medicaid Other | Source: Home / Self Care | Attending: Internal Medicine

## 2013-04-01 ENCOUNTER — Encounter (HOSPITAL_COMMUNITY): Payer: Self-pay | Admitting: Internal Medicine

## 2013-04-01 DIAGNOSIS — F439 Reaction to severe stress, unspecified: Secondary | ICD-10-CM

## 2013-04-01 DIAGNOSIS — F32A Depression, unspecified: Secondary | ICD-10-CM | POA: Diagnosis present

## 2013-04-01 DIAGNOSIS — E119 Type 2 diabetes mellitus without complications: Secondary | ICD-10-CM

## 2013-04-01 DIAGNOSIS — E1169 Type 2 diabetes mellitus with other specified complication: Secondary | ICD-10-CM | POA: Diagnosis present

## 2013-04-01 DIAGNOSIS — R079 Chest pain, unspecified: Secondary | ICD-10-CM

## 2013-04-01 DIAGNOSIS — E871 Hypo-osmolality and hyponatremia: Secondary | ICD-10-CM

## 2013-04-01 DIAGNOSIS — F329 Major depressive disorder, single episode, unspecified: Secondary | ICD-10-CM

## 2013-04-01 DIAGNOSIS — E1165 Type 2 diabetes mellitus with hyperglycemia: Secondary | ICD-10-CM | POA: Diagnosis present

## 2013-04-01 HISTORY — PX: LEFT HEART CATHETERIZATION WITH CORONARY ANGIOGRAM: SHX5451

## 2013-04-01 HISTORY — PX: PERCUTANEOUS CORONARY STENT INTERVENTION (PCI-S): SHX5485

## 2013-04-01 LAB — CBC
Hemoglobin: 12 g/dL (ref 12.0–15.0)
MCHC: 34.6 g/dL (ref 30.0–36.0)
Platelets: 260 10*3/uL (ref 150–400)
RDW: 14.6 % (ref 11.5–15.5)

## 2013-04-01 LAB — TROPONIN I
Troponin I: 0.53 ng/mL (ref <0.30)
Troponin I: 0.66 ng/mL (ref <0.30)

## 2013-04-01 LAB — GLUCOSE, CAPILLARY
Glucose-Capillary: 180 mg/dL — ABNORMAL HIGH (ref 70–99)
Glucose-Capillary: 227 mg/dL — ABNORMAL HIGH (ref 70–99)

## 2013-04-01 LAB — BASIC METABOLIC PANEL
BUN: 8 mg/dL (ref 6–23)
Calcium: 9 mg/dL (ref 8.4–10.5)
GFR calc Af Amer: >90 mL/min (ref >90)
GFR calc non Af Amer: >90 mL/min (ref >90)
Potassium: 4 mEq/L (ref 3.5–5.1)

## 2013-04-01 LAB — HEMOGLOBIN A1C: Mean Plasma Glucose: 263 mg/dL — ABNORMAL HIGH (ref <117)

## 2013-04-01 SURGERY — LEFT HEART CATHETERIZATION WITH CORONARY ANGIOGRAM
Anesthesia: LOCAL

## 2013-04-01 MED ORDER — SODIUM CHLORIDE 0.9 % IV SOLN: 250.0000 mL | INTRAVENOUS | Status: DC | PRN

## 2013-04-01 MED ORDER — IRBESARTAN 300 MG PO TABS
300.0000 mg | ORAL_TABLET | Freq: Every day | ORAL | Status: DC
  Administered 2013-04-01 – 2013-04-02 (×2): 300 mg via ORAL
  Filled 2013-04-01 (×2): qty 1

## 2013-04-01 MED ORDER — INSULIN GLARGINE 100 UNIT/ML ~~LOC~~ SOLN
10.0000 [IU] | Freq: Every day | SUBCUTANEOUS | Status: DC
  Administered 2013-04-01: 23:00:00 10 [IU] via SUBCUTANEOUS
  Filled 2013-04-01 (×2): qty 0.1

## 2013-04-01 MED ORDER — ONDANSETRON HCL 4 MG/2ML IJ SOLN: 4.0000 mg | Freq: Four times a day (QID) | INTRAMUSCULAR | Status: DC | PRN

## 2013-04-01 MED ORDER — LISINOPRIL 20 MG PO TABS
20.0000 mg | ORAL_TABLET | Freq: Every day | ORAL | Status: DC
  Administered 2013-04-01 – 2013-04-02 (×2): 20 mg via ORAL
  Filled 2013-04-01 (×2): qty 1

## 2013-04-01 MED ORDER — DOCUSATE SODIUM 100 MG PO CAPS
100.0000 mg | ORAL_CAPSULE | Freq: Two times a day (BID) | ORAL | Status: DC
  Administered 2013-04-01 – 2013-04-02 (×2): 100 mg via ORAL
  Filled 2013-04-01 (×5): qty 1

## 2013-04-01 MED ORDER — SODIUM CHLORIDE 0.9 % IJ SOLN: 3.0000 mL | INTRAMUSCULAR | Status: DC | PRN

## 2013-04-01 MED ORDER — METFORMIN HCL 500 MG PO TABS
1000.0000 mg | ORAL_TABLET | Freq: Two times a day (BID) | ORAL | Status: DC
  Administered 2013-04-01: 1000 mg via ORAL
  Filled 2013-04-01 (×3): qty 2

## 2013-04-01 MED ORDER — PRASUGREL HCL 10 MG PO TABS
10.0000 mg | ORAL_TABLET | Freq: Every day | ORAL | Status: DC
  Administered 2013-04-01 – 2013-04-02 (×2): 10 mg via ORAL
  Filled 2013-04-01 (×2): qty 1

## 2013-04-01 MED ORDER — MORPHINE SULFATE 2 MG/ML IJ SOLN: 2.0000 mg | INTRAMUSCULAR | Status: DC | PRN

## 2013-04-01 MED ORDER — HYDROMORPHONE HCL PF 2 MG/ML IJ SOLN
INTRAMUSCULAR | Status: AC
  Filled 2013-04-01: qty 1

## 2013-04-01 MED ORDER — INSULIN ASPART 100 UNIT/ML ~~LOC~~ SOLN
0.0000 [IU] | Freq: Every day | SUBCUTANEOUS | Status: DC
  Administered 2013-04-01: 23:00:00 2 [IU] via SUBCUTANEOUS

## 2013-04-01 MED ORDER — ACETAMINOPHEN 325 MG PO TABS
650.0000 mg | ORAL_TABLET | ORAL | Status: DC | PRN
  Administered 2013-04-02: 02:00:00 650 mg via ORAL
  Filled 2013-04-01: qty 2

## 2013-04-01 MED ORDER — HEPARIN (PORCINE) IN NACL 100-0.45 UNIT/ML-% IJ SOLN
1300.0000 [IU]/h | INTRAMUSCULAR | Status: DC
  Administered 2013-04-01: 1300 [IU]/h via INTRAVENOUS
  Filled 2013-04-01 (×2): qty 250

## 2013-04-01 MED ORDER — SODIUM CHLORIDE 0.9 % IJ SOLN: 3.0000 mL | Freq: Two times a day (BID) | INTRAMUSCULAR | Status: DC

## 2013-04-01 MED ORDER — AMLODIPINE BESYLATE-VALSARTAN 5-160 MG PO TABS: 2.0000 | ORAL_TABLET | Freq: Every day | ORAL | Status: DC

## 2013-04-01 MED ORDER — ASPIRIN EC 81 MG PO TBEC
81.0000 mg | DELAYED_RELEASE_TABLET | Freq: Every day | ORAL | Status: DC
  Administered 2013-04-01: 81 mg via ORAL
  Filled 2013-04-01: qty 1

## 2013-04-01 MED ORDER — HEPARIN BOLUS VIA INFUSION
4000.0000 [IU] | Freq: Once | INTRAVENOUS | Status: AC
  Administered 2013-04-01: 4000 [IU] via INTRAVENOUS
  Filled 2013-04-01: qty 4000

## 2013-04-01 MED ORDER — INSULIN ASPART 100 UNIT/ML ~~LOC~~ SOLN
0.0000 [IU] | Freq: Three times a day (TID) | SUBCUTANEOUS | Status: DC
  Administered 2013-04-01: 8 [IU] via SUBCUTANEOUS
  Administered 2013-04-01: 3 [IU] via SUBCUTANEOUS
  Administered 2013-04-02 (×2): 5 [IU] via SUBCUTANEOUS

## 2013-04-01 MED ORDER — SODIUM CHLORIDE 0.9 % IV SOLN
1.0000 mL/kg/h | INTRAVENOUS | Status: DC
  Administered 2013-04-01: 1 mL/kg/h via INTRAVENOUS

## 2013-04-01 MED ORDER — AMLODIPINE BESYLATE 10 MG PO TABS
10.0000 mg | ORAL_TABLET | Freq: Every day | ORAL | Status: DC
  Administered 2013-04-01 – 2013-04-02 (×2): 10 mg via ORAL
  Filled 2013-04-01 (×2): qty 1

## 2013-04-01 MED ORDER — MORPHINE SULFATE 4 MG/ML IJ SOLN
4.0000 mg | Freq: Once | INTRAMUSCULAR | Status: AC
  Administered 2013-04-01: 4 mg via INTRAVENOUS
  Filled 2013-04-01: qty 1

## 2013-04-01 MED ORDER — ASPIRIN 81 MG PO CHEW
81.0000 mg | CHEWABLE_TABLET | Freq: Every day | ORAL | Status: DC
  Administered 2013-04-02: 81 mg via ORAL
  Filled 2013-04-01: qty 1

## 2013-04-01 MED ORDER — SODIUM CHLORIDE 0.9 % IV SOLN
1.0000 mL/kg/h | INTRAVENOUS | Status: AC
  Administered 2013-04-01: 23:00:00 1 mL/kg/h via INTRAVENOUS

## 2013-04-01 MED ORDER — LIDOCAINE HCL (PF) 1 % IJ SOLN
INTRAMUSCULAR | Status: AC
  Filled 2013-04-01: qty 30

## 2013-04-01 MED ORDER — ATORVASTATIN CALCIUM 10 MG PO TABS
10.0000 mg | ORAL_TABLET | Freq: Every day | ORAL | Status: DC
  Administered 2013-04-01 – 2013-04-02 (×2): 10 mg via ORAL
  Filled 2013-04-01 (×2): qty 1

## 2013-04-01 MED ORDER — HYDROCHLOROTHIAZIDE 25 MG PO TABS
25.0000 mg | ORAL_TABLET | Freq: Every day | ORAL | Status: DC
  Administered 2013-04-01 – 2013-04-02 (×2): 25 mg via ORAL
  Filled 2013-04-01 (×2): qty 1

## 2013-04-01 MED ORDER — NITROGLYCERIN 0.2 MG/ML ON CALL CATH LAB
INTRAVENOUS | Status: AC
  Filled 2013-04-01: qty 1

## 2013-04-01 MED ORDER — SODIUM CHLORIDE 0.9 % IV SOLN: 1.0000 mL/kg/h | INTRAVENOUS | Status: DC

## 2013-04-01 MED ORDER — SODIUM CHLORIDE 0.9 % IJ SOLN
3.0000 mL | Freq: Two times a day (BID) | INTRAMUSCULAR | Status: DC
  Administered 2013-04-01: 3 mL via INTRAVENOUS

## 2013-04-01 MED ORDER — MIDAZOLAM HCL 2 MG/2ML IJ SOLN
INTRAMUSCULAR | Status: AC
  Filled 2013-04-01: qty 2

## 2013-04-01 MED ORDER — HYDRALAZINE HCL 20 MG/ML IJ SOLN: 10.0000 mg | INTRAMUSCULAR | Status: DC | PRN

## 2013-04-01 MED ORDER — ATENOLOL 100 MG PO TABS
100.0000 mg | ORAL_TABLET | Freq: Every day | ORAL | Status: DC
  Administered 2013-04-01 – 2013-04-02 (×2): 100 mg via ORAL
  Filled 2013-04-01 (×2): qty 1

## 2013-04-01 MED ORDER — HYDRALAZINE HCL 25 MG PO TABS
25.0000 mg | ORAL_TABLET | Freq: Four times a day (QID) | ORAL | Status: DC
  Administered 2013-04-01 – 2013-04-02 (×2): 25 mg via ORAL
  Filled 2013-04-01 (×9): qty 1

## 2013-04-01 MED ORDER — SODIUM CHLORIDE 0.9 % IV SOLN
0.2500 mg/kg/h | INTRAVENOUS | Status: DC
  Filled 2013-04-01: qty 250

## 2013-04-01 MED ORDER — ONDANSETRON HCL 4 MG PO TABS: 4.0000 mg | ORAL_TABLET | Freq: Four times a day (QID) | ORAL | Status: DC | PRN

## 2013-04-01 MED ORDER — HEPARIN (PORCINE) IN NACL 2-0.9 UNIT/ML-% IJ SOLN
INTRAMUSCULAR | Status: AC
  Filled 2013-04-01: qty 1000

## 2013-04-01 MED ORDER — BIVALIRUDIN 250 MG IV SOLR
INTRAVENOUS | Status: AC
  Filled 2013-04-01: qty 250

## 2013-04-01 MED ORDER — VILAZODONE HCL 40 MG PO TABS: 40.0000 mg | ORAL_TABLET | ORAL | Status: DC

## 2013-04-01 NOTE — Interval H&P Note (Signed)
History and Physical Interval Note:  04/01/2013 5:35 PM  Amy Chaney  has presented today for surgery, with the diagnosis of Chest pain  The various methods of treatment have been discussed with the patient and family. After consideration of risks, benefits and other options for treatment, the patient has consented to  Procedure(s): LEFT HEART CATHETERIZATION WITH CORONARY ANGIOGRAM (N/A) and possible PCI  as a surgical intervention .  The patient's history has been reviewed, patient examined, no change in status, stable for surgery.  I have reviewed the patient's chart and labs.  Questions were answered to the patient's satisfaction.   Cath Lab Visit (complete for each Cath Lab visit)  Clinical Evaluation Leading to the Procedure:   ACS: yes  Non-ACS:    Anginal Classification: CCS IV  Anti-ischemic medical therapy: Maximal Therapy (2 or more classes of medications)  Non-Invasive Test Results: No non-invasive testing performed  Prior CABG: No previous CABG         [facility]

## 2013-04-01 NOTE — H&P (View-Only) (Signed)
CARDIOLOGY CONSULT NOTE  Patient ID: Amy Chaney MRN: [id number] DOB/AGE: 43/15/1972 43 y.o.  Admit date: 03/31/2013 Referring Physician  [facility] Primary Physician:  OSEI-BONSU,GEORGE, MD Reason for Consultation  Chest pain  HPI: Patient is a 42-year-old African American female with history of hypertension, hyperlipidemia, uncontrolled diabetes mellitus, morbid obesity and history of coronary angioplasty for a chronic total occlusion of the right coronary artery that was performed on 02/25/2013 with implantation of a long 3.0 x 38 mm overlap with a 3.5 x 16 mm promos drug-eluting stent. She stopped taking her Effient 4 days ago. She has been noncompliant with dietary discretion. She was at work when she developed chest discomfort which is described as sharp heaviness, similar to angina, however locates her finger to the left lower part of her chest. She was also dyspneic. Given this she came to the emergency room. She did receive one sublingual nitroglycerin via EMS, with no significant relief of chest discomfort. In the emergency room, chest pain was felt to be atypical and she was admitted to the hospital to exclude unstable angina and acute cord syndrome given her recent CAD history. Patient was at work when she developed chest discomfort. It was described as having sharp pain to heaviness lasting a few seconds to a few minutes on and off. No other associated symptoms except patient did state that she was having some difficulty breathing. No leg edema, no painful swelling of the lower extremity is, no syncope. Patient states that she's not had any further recurrence of chest pain. She states that she's doing well this morning. She denies any symptoms to suggest TIA or claudication. No recent bowel or bladder disturbances. No recent weight changes. She stills continues to smoke about one or 2 cigarettes a day  Past Medical History  Diagnosis Date  . Hypertension   . Coronary artery disease   . High  cholesterol   . Heart murmur   . Chest pain at rest     "and w/exertion; last couple months" (02/25/2013)  . Shortness of breath     "at rest and w/exertion last couple months" (02/25/2013)  . Type II diabetes mellitus 12/2010  . Daily headache   . Depression      Past Surgical History  Procedure Laterality Date  . Coronary angioplasty with stent placement  02/25/2013    "2" (02/25/2013)  . Cholecystectomy  ~ 2000  . Tubal ligation  1994     Family History  Problem Relation Age of Onset  . Diabetes Mother   . Diabetes Sister   . Diabetes Son     type 1  . Diabetes Sister   . Diabetes Son     type 2     Social History: History   Social History  . Marital Status: Single    Spouse Name: N/A    Number of Children: N/A  . Years of Education: N/A   Occupational History  . Not on file.   Social History Main Topics  . Smoking status: Current Every Day Smoker -- 1.00 packs/day for 15 years    Types: Cigarettes  . Smokeless tobacco: Never Used  . Alcohol Use: Yes     Comment: 02/25/2013 "only on special occasions; maybe once/yr"  . Drug Use: Yes    Special: Marijuana     Comment: 02/25/2013 "quit marijuana ~ 2001"  . Sexual Activity: Not Currently    Birth Control/ Protection: Surgical   Other Topics Concern  . Not on file     Social History Narrative  . No narrative on file     Prescriptions prior to admission  Medication Sig Dispense Refill  . amLODipine-valsartan (EXFORGE) 5-160 MG per tablet Take 2 tablets by mouth daily.      . aspirin EC 81 MG tablet Take 81 mg by mouth daily.      . Aspirin-Acetaminophen-Caffeine (GOODY HEADACHE PO) Take 1 packet by mouth daily as needed (pain).      . atenolol (TENORMIN) 100 MG tablet Take 100 mg by mouth daily.      . atorvastatin (LIPITOR) 10 MG tablet Take 10 mg by mouth daily.      . ferrous sulfate 325 (65 FE) MG tablet Take 325 mg by mouth daily with breakfast.      . hydrALAZINE (APRESOLINE) 25 MG tablet Take 1 tablet  (25 mg total) by mouth 4 (four) times daily.  120 tablet  1  . hydrochlorothiazide (HYDRODIURIL) 25 MG tablet Take 1 tablet (25 mg total) by mouth daily.  90 tablet  3  . lisinopril (PRINIVIL,ZESTRIL) 20 MG tablet Take 20 mg by mouth daily.      . metFORMIN (GLUCOPHAGE) 500 MG tablet Take 1,000 mg by mouth 2 (two) times daily with a meal.       . nitroGLYCERIN (NITROSTAT) 0.4 MG SL tablet Place 0.4 mg under the tongue every 5 (five) minutes as needed for chest pain.      . prasugrel (EFFIENT) 10 MG TABS Take 1 tablet (10 mg total) by mouth daily.  30 tablet  0  . Vilazodone HCl (VIIBRYD) 40 MG TABS Take 40 mg by mouth See admin instructions. 14 day course to start 02/24/13 (once daily)        Scheduled Meds: . amLODipine  10 mg Oral Daily   And  . irbesartan  300 mg Oral Daily  . aspirin EC  81 mg Oral Daily  . atenolol  100 mg Oral Daily  . atorvastatin  10 mg Oral Daily  . docusate sodium  100 mg Oral BID  . hydrALAZINE  25 mg Oral QID  . hydrochlorothiazide  25 mg Oral Daily  . insulin aspart  0-15 Units Subcutaneous TID WC  . insulin aspart  0-5 Units Subcutaneous QHS  . lisinopril  20 mg Oral Daily  . metFORMIN  1,000 mg Oral BID WC  . prasugrel  10 mg Oral Daily  . sodium chloride  3 mL Intravenous Q12H  . sodium chloride  3 mL Intravenous Q12H   Continuous Infusions: . heparin 1,300 Units/hr (04/01/13 0417)   PRN Meds:.sodium chloride, morphine injection, ondansetron (ZOFRAN) IV, ondansetron, sodium chloride  ROS: General: no fevers/chills/night sweats Eyes: no blurry vision, diplopia, or amaurosis Other systems are negative. See history of present illness    Physical Exam: Blood pressure 142/68, pulse 74, temperature 97.6 F (36.4 C), temperature source Oral, resp. rate 18, height 5' 9" (1.753 m), weight 116.665 kg (257 lb 3.2 oz), last menstrual period 03/24/2013, SpO2 100.00%.  General appearance: alert, cooperative, appears older than stated age and no distress   Resp: clear to auscultation bilaterally  Chest exam: Mild tenderness in the left fifth and sixth costochondral junction. Cardio: regular rate and rhythm, S1, S2 normal, no murmur, click, rub or gallop  GI: soft, non-tender; bowel sounds normal; no masses, no organomegaly  Extremities: extremities normal, atraumatic, no cyanosis or edema  Pulses: 2+ and symmetric  Radial artery arterial access site without any complication and good pulse.  Neurologic: Grossly   normal  Labs:   Lab Results  Component Value Date   WBC 12.5* 03/31/2013   HGB 12.0 03/31/2013   HCT 34.7* 03/31/2013   MCV 80.0 03/31/2013   PLT 260 03/31/2013    Recent Labs Lab 03/31/13 2349  NA 133*  K 4.0  CL 99  CO2 21  BUN 8  CREATININE 0.56  CALCIUM 9.0  GLUCOSE 257*   Lab Results  Component Value Date   TROPONINI <0.30 04/01/2013    Lipid Panel     Component Value Date/Time   CHOL 157 09/14/2011 0900   TRIG 49.0 09/14/2011 0900   HDL 47.10 09/14/2011 0900   CHOLHDL 3 09/14/2011 0900   VLDL 9.8 09/14/2011 0900   LDLCALC 100* 09/14/2011 0900    EKG: 03/31/2013: Normal sinus rhythm at a rate of 75 beats a minute, normal intervals, LVH. Compared to EKG 02/26/2013, prolonged QT is no longer present. No ischemia.    Radiology: Dg Chest Port 1 View  03/31/2013   *RADIOLOGY REPORT*  Clinical Data: Chest pain  PORTABLE CHEST - 1 VIEW  Comparison: None available  Findings: Cardiac and mediastinal silhouettes are within normal limits.  Lungs are normally inflated.  No airspace consolidation, pleural effusion, or pulmonary edema is identified.  There is no pneumothorax.  No acute osseous abnormality.  IMPRESSION: No acute cardiopulmonary process.   Original Report Authenticated By: Benjamin McClintock, M.D.      ASSESSMENT AND PLAN:  1. Chest pain, probably angina pectoris, probably stable. No recurrence since admission to the hospital. No evidence of ischemia by EKG and cardiac markers were negative. Chest and also has  atypical qualities with reproducible pain in the left lower quadrant. 2. CAD, S/P PTCA and stenting of the proximal and midsegment of the dominant RCA with implantation of a 3.0 x 38 mm in the midsegment overlapped proximally with a 3.5 x 16 mm promos Premier DES on 02/25/2013. 3. Tobacco use disorder 4. Noncompliance with medication, patient stopped taking Effient about 4-5 days ago. No explanation given. 5. Diabetes mellitus type 2 uncontrolled 6. Hypertension 7. Hyperlipidemia 8. Morbid obesity  Recommendation: Patient essentially asymptomatic without any EKG abnormalities and negative cardiac markers. I would recommend that we ambulate, if no further chest pain, can be discharged home with outpatient followup. I have discussed extensively with the patient in presence of the R.N. regarding being compliant with medications and also with the diet. Smoking cessation was discussed in detail. Patient advised to contact me if she has recurrence of chest pain. He is also advised to have sublingual nitroglycerin available with her at all times. Unless examination she has recurrence of chest pain, I presume she can be discharged home safely.  Cardiac Panel (last 3 results)  Recent Labs  04/01/13 0845 04/01/13 1047  TROPONINI <0.30 0.53*   Patient has positive cardiac markers per non-ST elevation myocardial infarction.  Patient will continue with IV heparin, I will set her up for coronary angiography this afternoon.  Omar Orrego,JAGADEESH R, MD 04/01/2013, 10:58 AM [facility] [place] Pager: [phone] Office: [phone] If no answer Cell [phone]  

## 2013-04-01 NOTE — H&P (Signed)
[facility] History and Physical  Amy Chaney ZOX:[id number] DOB: 08/06/1970    PCP:   Jackie Plum, MD   Chief Complaint: substernal chest pain and left arm pain.  HPI: Amy Chaney is an 42 y.o. female with hx of hypercholesterolemia, HTN, CAD, last catherization one month ago, with chronic total occluded RCA, s/p stent placement with DES (Dr Jacinto Halim), severe depression, anxiety, obesity, having significant stress with her 40 yo daughter, presents to the ER with substernal chest pain.  She also had some shortness of breath and diaphoresis.  She said her pain is different than her previous pain.  She also has chronic daily HA and has some tonight as well.  She has no black or bloody stool, and no abdominal pain.  Evaluation in the ER showed EKG with SR and slight prolonged Qtc, no acute ischemic changes.  CXR was clear and her troponin was negative.  Her D-dimer was negative as well.  She still has some residual chest pain in the ER.  Hospitalist was asked to admit her for cardiac r/out.  Rewiew of Systems:  Constitutional: Negative for malaise, fever and chills. No significant weight loss or weight gain Eyes: Negative for eye pain, redness and discharge, diplopia, visual changes, or flashes of light. ENMT: Negative for ear pain, hoarseness, nasal congestion, sinus pressure and sore throat. No headaches; tinnitus, drooling, or problem swallowing. Cardiovascular: Negative for  palpitations, diaphoresis, dyspnea and peripheral edema. ; No orthopnea, PND Respiratory: Negative for cough, hemoptysis, wheezing and stridor. No pleuritic chestpain. Gastrointestinal: Negative for nausea, vomiting, diarrhea, constipation, abdominal pain, melena, blood in stool, hematemesis, jaundice and rectal bleeding.    Genitourinary: Negative for frequency, dysuria, incontinence,flank pain and hematuria; Musculoskeletal: Negative for back pain and neck pain. Negative for swelling and trauma.;  Skin:  . Negative for pruritus, rash, abrasions, bruising and skin lesion.; ulcerations Neuro: Negative for headache, lightheadedness and neck stiffness. Negative for weakness, altered level of consciousness , altered mental status, extremity weakness, burning feet, involuntary movement, seizure and syncope.  Psych: negative for anxiety, insomnia, , panic attacks, hallucinations, paranoia, suicidal or homicidal ideation    Past Medical History  Diagnosis Date  . Hypertension   . Coronary artery disease   . High cholesterol   . Heart murmur   . Chest pain at rest     "and w/exertion; last couple months" (02/25/2013)  . Shortness of breath     "at rest and w/exertion last couple months" (02/25/2013)  . Type II diabetes mellitus 12/2010  . Daily headache   . Depression     Past Surgical History  Procedure Laterality Date  . Coronary angioplasty with stent placement  02/25/2013    "2" (02/25/2013)  . Cholecystectomy  ~ 2000  . Tubal ligation  1994    Medications:  HOME MEDS: Prior to Admission medications   Medication Sig Start Date End Date Taking? Authorizing Provider  amLODipine-valsartan (EXFORGE) 5-160 MG per tablet Take 2 tablets by mouth daily. 02/26/13  Yes Pamella Pert, MD  aspirin EC 81 MG tablet Take 81 mg by mouth daily.   Yes Historical Provider, MD  Aspirin-Acetaminophen-Caffeine (GOODY HEADACHE PO) Take 1 packet by mouth daily as needed (pain).   Yes Historical Provider, MD  atenolol (TENORMIN) 100 MG tablet Take 100 mg by mouth daily.   Yes Historical Provider, MD  atorvastatin (LIPITOR) 10 MG tablet Take 10 mg by mouth daily.   Yes Historical Provider, MD  ferrous sulfate 325 (65 FE) MG  tablet Take 325 mg by mouth daily with breakfast.   Yes Historical Provider, MD  hydrALAZINE (APRESOLINE) 25 MG tablet Take 1 tablet (25 mg total) by mouth 4 (four) times daily. 02/26/13  Yes Pamella Pert, MD  hydrochlorothiazide (HYDRODIURIL) 25 MG tablet Take 1 tablet (25 mg total)  by mouth daily. 09/29/11  Yes Tonny Bollman, MD  lisinopril (PRINIVIL,ZESTRIL) 20 MG tablet Take 20 mg by mouth daily.   Yes Historical Provider, MD  metFORMIN (GLUCOPHAGE) 500 MG tablet Take 1,000 mg by mouth 2 (two) times daily with a meal.    Yes Historical Provider, MD  nitroGLYCERIN (NITROSTAT) 0.4 MG SL tablet Place 0.4 mg under the tongue every 5 (five) minutes as needed for chest pain.   Yes Historical Provider, MD  prasugrel (EFFIENT) 10 MG TABS Take 1 tablet (10 mg total) by mouth daily. 02/26/13  Yes Pamella Pert, MD  Vilazodone HCl (VIIBRYD) 40 MG TABS Take 40 mg by mouth See admin instructions. 14 day course to start 02/24/13 (once daily)   Yes Historical Provider, MD     Allergies:  No Known Allergies  Social History:   reports that she has been smoking Cigarettes.  She has a 15 pack-year smoking history. She has never used smokeless tobacco. She reports that  drinks alcohol. She reports that she uses illicit drugs (Marijuana).  Family History: Family History  Problem Relation Age of Onset  . Diabetes Mother   . Diabetes Sister   . Diabetes Son     type 1  . Diabetes Sister   . Diabetes Son     type 2     Physical Exam: Filed Vitals:   04/01/13 0030 04/01/13 0100 04/01/13 0130 04/01/13 0200  BP: 147/72 134/73 147/75 138/65  Pulse: 63 87 66 81  Temp:      TempSrc:      Resp: 16 21 23 15   Height:      Weight:      SpO2: 100% 100% 100% 100%   Blood pressure 138/65, pulse 81, temperature 98.1 F (36.7 C), temperature source Oral, resp. rate 15, height 5\' 9"  (1.753 m), weight 117.935 kg (260 lb), last menstrual period 03/24/2013, SpO2 100.00%.  GEN:  Pleasant  patient lying in the stretcher in no acute distress; cooperative with exam. PSYCH:  alert and oriented x4; does not appear anxious or depressed; affect is appropriate. HEENT: Mucous membranes pink and anicteric; PERRLA; EOM intact; no cervical lymphadenopathy nor thyromegaly or carotid bruit; no JVD;  There were no stridor. Neck is very supple. Breasts:: Not examined CHEST WALL: No tenderness CHEST: Normal respiration, clear to auscultation bilaterally.  HEART: Regular rate and rhythm.  There are no murmur, rub, or gallops.   BACK: No kyphosis or scoliosis; no CVA tenderness ABDOMEN: soft and non-tender; no masses, no organomegaly, normal abdominal bowel sounds; no pannus; no intertriginous candida. There is no rebound and no distention. Rectal Exam: Not done EXTREMITIES: No bone or joint deformity; age-appropriate arthropathy of the hands and knees; no edema; no ulcerations.  There is no calf tenderness. Genitalia: not examined PULSES: 2+ and symmetric SKIN: Normal hydration no rash or ulceration CNS: Cranial nerves 2-12 grossly intact no focal lateralizing neurologic deficit.  Speech is fluent; uvula elevated with phonation, facial symmetry and tongue midline. DTR are normal bilaterally, cerebella exam is intact, barbinski is negative and strengths are equaled bilaterally.  No sensory loss.   Labs on Admission:  Basic Metabolic Panel:  Recent Labs Lab 03/31/13  2349  NA 133*  K 4.0  CL 99  CO2 21  GLUCOSE 257*  BUN 8  CREATININE 0.56  CALCIUM 9.0   Liver Function Tests: No results found for this basename: AST, ALT, ALKPHOS, BILITOT, PROT, ALBUMIN,  in the last 168 hours No results found for this basename: LIPASE, AMYLASE,  in the last 168 hours No results found for this basename: AMMONIA,  in the last 168 hours CBC:  Recent Labs Lab 03/31/13 2349  WBC 12.5*  HGB 12.0  HCT 34.7*  MCV 80.0  PLT 260   Cardiac Enzymes: No results found for this basename: CKTOTAL, CKMB, CKMBINDEX, TROPONINI,  in the last 168 hours  CBG: No results found for this basename: GLUCAP,  in the last 168 hours   Radiological Exams on Admission: Dg Chest Port 1 View  03/31/2013   *RADIOLOGY REPORT*  Clinical Data: Chest pain  PORTABLE CHEST - 1 VIEW  Comparison: None available  Findings:  Cardiac and mediastinal silhouettes are within normal limits.  Lungs are normally inflated.  No airspace consolidation, pleural effusion, or pulmonary edema is identified.  There is no pneumothorax.  No acute osseous abnormality.  IMPRESSION: No acute cardiopulmonary process.   Original Report Authenticated By: Rise Mu, M.D.    EKG: Independently reviewed. NSR no acute ST T changes.   Assessment/Plan Present on Admission:  . Chest pain, mid sternal . Hypertension . DM2 (diabetes mellitus, type 2) . Hypercholesterolemia . Depression  PLAN: Will admit for atypical chest pain r/out.  I spoke with Dr Jacinto Halim tonight, and since she still has some chest pain, and had not taken her Effient, will fully heparinize her until r/out completely.  Will resume her Effient at 10mg  per day.  I will continue her meds and cycle her troponin.  She has severe depression and I will continue her meds.  Dr Jacinto Halim will see her later today for any further recommendation.  Thank you for allowing to partake in the care of this nice patient.  Other plans as per orders.  Code Status: FULL Unk Lightning, MD. [facility] Pager [phone] 7pm to 7am.  04/01/2013, 2:26 AM

## 2013-04-01 NOTE — Progress Notes (Signed)
UR Completed Panzy Bubeck Graves-Bigelow, RN,BSN [phone]  

## 2013-04-01 NOTE — CV Procedure (Signed)
Procedure performed:  Left heart catheterization including hemodynamic monitoring of the left ventricle, LV gram, selective right and left coronary arteriography. PTCA and stenting of the mid RCA with implantation of a 3.0 x 20 mm promos Premier drug-eluting stent for subacute stent thrombosis. Patient has history of PTCA and stenting of CTO of the RCA on 02/25/2013 with implantation of a long 3.0 x 38 mm overlap with a 3.5 x 16 mm promos drug-eluting stent.   Performed: Femoral arteriogram and closure of the femoral arterial access with Perclose.  Indication patient is a 42 year-old AA female with history of CAD and h/o PTCA to CTO of RCA on 02/25/2013, hypertension (), hyperlipidemia (), Diabetes Mellitus ()  who presents with NSTEMI. Patient has stopped taking Effient 4 days prior to admission to the hospital. Hence is brought to the cardiac catheterization lab to evaluate his coronary anatomy.  Hemodynamic data:  Left ventricular pressure was 94/13with LVEDP of 17 mm mercury. Aortic pressure was 97/56 with a mean of 76 mm mercury. There was no pressure gradient across the aortic valve.   Left ventricle: Performed in the RAO projection revealed LVEF of 55. No wall motion abnormality noted.   Right coronary artery: It's a large vessel, previously placed stent in the proximal segment where patent, however distal segment was occluded. No collaterals were evident from the left system.   Left main coronary artery is large and normal.   Circumflex coronary artery: A large vessel giving origin to a large high obtuse marginal 1. It also gives origin to a large OM 2 and continues in the AV groove, smooth and normal.   LAD: LAD gives origin to a large diagonal-1 and D2. LAD has very mild luminal irregularities.   Impression:  subacute stent thrombosis involving the midsegment of the right coronary artery stents that was previously placed, history of PTCA and stenting of CTO of the RCA on 02/25/2013  with implantation of a long 3.0 x 38 mm overlap with a 3.5 x 16 mm promos drug-eluting stent  Interventional data: Successful PTCA and stenting of the  mid RCA  with implantation of a  3.0 x 20 mm promos Premier DES. Stenosis reduced from 100% to 0% with brisk TIMI 0 to TIMI 3 flow at the end of the procedure. Patient will  need Dual antiplatelet therapy with Effinet and ASA 81 mg for at least One year.   Technique: Under sterile precautions using a 6 French right femoral arterial access, a 6 French sheath was introduced into the right femoral artery under fluoroscopy guidance. A 6 [place] multipurpose B2 catheter was advanced into the ascending aorta and then into the left ventricle. Hemodynamics are analysed. LV angiogram   performed in  both RAO and LAO projection. Catheter pulled into the ascending aorta and right coronary artery was cannulated, then left coronary artery was cannulated and angiography was performed in multiple views.  Catheter exchanged out of the body over J-Wire. NO immediate complications noted. Patient tolerated the procedure well.   Technique of intervention: Using a 6 [place] JR4 with [facility]  guide catheter the RCA    was selected and cannulated. Using Angiomax for anticoagulation, I utilized a BMW guidewire  because of inability to cross the subacute stent thrombosis, suspected organized thrombus, I utilized a 2.0 x 15 mm mini trek  And then . Then I performed balloon angioplasty at 14 atmospheric pressure for 30 seconds x2 and 60 seconds x1. I was able to establish TIMI 2 flow  to TIMI-3 flow with balloon angioplasty, however significant laminated and organized thrombus was evident at the site of stent occlusion. Hence I decided to proceed with the stenting the segment with a 3.0 x 20 mm promos Premier drug-eluting stent which easily crossed the stenosis and the stent was deployed at 11 atmospheric pressure for 50 seconds followed by post dilatation with the same stent balloon gently  withdrawing it within the stent struts at 16 atmospheric pressure for 30 seconds. Repeat angiography revealed excellent results with TIMI-3 flow without any residual stenosis.   There was no evidence of edge dissection. The guidewire was withdrawn out of the body and the guide catheter was engaged and pulled out of the body over the J-wire the was no immediate complication. Patient tolerated the procedure well. Right femoral arteriogram was performed through the arterial access sheath and access was closed with Perclose with excellent hemostasis.   A total of 100 cc of contrast was utilized for diagnostic and interventional procedure.  Disposition: Patient will be discharged in AM unless complications with out-patient follow up.

## 2013-04-01 NOTE — Discharge Summary (Deleted)
Physician Discharge Summary  Amy Chaney ZOX:[id number] DOB: [birth year] DOA: 03/31/2013  PCP: Jackie Plum, MD  Admit date: 03/31/2013 Discharge date: 04/01/2013  Time spent: > 25 minutes  minutes  Recommendations for Outpatient Follow-up:   -Follow up with PCP in 2 weeks; will need to follow up on glucometer and adjust DM medication regimen  -follow up with Dr. Jacinto Halim in 1-2 weeks -d/w and reinforced medication compliance   Discharge Diagnoses:  Active Problems:   Chest pain, mid sternal   Hypertension   DM2 (diabetes mellitus, type 2)   Hypercholesterolemia   Depression   Stress at home   Discharge Condition: stable   Diet recommendation: heat healthy   Filed Weights   03/31/13 2313 04/01/13 0329  Weight: 260 lb (117.935 kg) 257 lb 3.2 oz (116.665 kg)    History of present illness:  42 y.o. female with hx of hypercholesterolemia, HTN, CAD, last catherization one month ago, with chronic total occluded RCA, s/p stent placement with DES (Dr Jacinto Halim), severe depression, anxiety, obesity, having significant stress with her 38 yo daughter, presents to the ER with substernal chest pain. She also had some shortness of breath and diaphoresis. She said her pain is different than her previous pain. She also has chronic daily HA and has some tonight as well. She has no black or bloody stool, and no abdominal pain. Evaluation in the ER showed EKG with SR and slight prolonged Qtc, no acute ischemic changes    Hospital Course:  42 y.o. female with hx of hypercholesterolemia, HTN, CAD, last catherization one month ago, with chronic total occluded RCA, s/p stent placement with DES (Dr Jacinto Halim), severe depression, anxiety, obesity, having significant stress with her 72 yo daughter, presents to the ER with substernal chest pain  -troponins came positive will hold d/c; plan LHC   Procedures:  chest xray  Consultations:  Dr. Jacinto Halim  Discharge Exam: Filed Vitals:   04/01/13 1054  BP:  142/68  Pulse:   Temp:   Resp:     General: alert, awake  Cardiovascular: S, S2 RRR  Respiratory: CTA BL  Discharge Instructions  Discharge Orders   Future Orders Complete By Expires   Diet - low sodium heart healthy  As directed    Discharge instructions  As directed    Comments:     Follow up with primary care doctor in 1-2 weeks   Increase activity slowly  As directed        Medication List         amLODipine-valsartan 5-160 MG per tablet  Commonly known as:  EXFORGE  Take 2 tablets by mouth daily.     aspirin EC 81 MG tablet  Take 81 mg by mouth daily.     atenolol 100 MG tablet  Commonly known as:  TENORMIN  Take 100 mg by mouth daily.     atorvastatin 10 MG tablet  Commonly known as:  LIPITOR  Take 10 mg by mouth daily.     ferrous sulfate 325 (65 FE) MG tablet  Take 325 mg by mouth daily with breakfast.     GOODY HEADACHE PO  Take 1 packet by mouth daily as needed (pain).     hydrALAZINE 25 MG tablet  Commonly known as:  APRESOLINE  Take 1 tablet (25 mg total) by mouth 4 (four) times daily.     hydrochlorothiazide 25 MG tablet  Commonly known as:  HYDRODIURIL  Take 1 tablet (25 mg total) by mouth daily.  lisinopril 20 MG tablet  Commonly known as:  PRINIVIL,ZESTRIL  Take 20 mg by mouth daily.     metFORMIN 500 MG tablet  Commonly known as:  GLUCOPHAGE  Take 1,000 mg by mouth 2 (two) times daily with a meal.     nitroGLYCERIN 0.4 MG SL tablet  Commonly known as:  NITROSTAT  Place 0.4 mg under the tongue every 5 (five) minutes as needed for chest pain.     prasugrel 10 MG Tabs tablet  Commonly known as:  EFFIENT  Take 1 tablet (10 mg total) by mouth daily.     VIIBRYD 40 MG Tabs  Generic drug:  Vilazodone HCl  Take 40 mg by mouth See admin instructions. 14 day course to start 02/24/13 (once daily)       No Known Allergies     Follow-up Information   Follow up with OSEI-BONSU,GEORGE, MD In 2 weeks.   Specialty:  Internal Medicine    Contact information:   [address] [place] [address] [phone]       Follow up with Pamella Pert, MD In 1 week.   Specialty:  Cardiology   Contact information:   [address] [place] [address] [phone]        The results of significant diagnostics from this hospitalization (including imaging, microbiology, ancillary and laboratory) are listed below for reference.    Significant Diagnostic Studies: Dg Chest Port 1 View  03/31/2013   *RADIOLOGY REPORT*  Clinical Data: Chest pain  PORTABLE CHEST - 1 VIEW  Comparison: None available  Findings: Cardiac and mediastinal silhouettes are within normal limits.  Lungs are normally inflated.  No airspace consolidation, pleural effusion, or pulmonary edema is identified.  There is no pneumothorax.  No acute osseous abnormality.  IMPRESSION: No acute cardiopulmonary process.   Original Report Authenticated By: Rise Mu, M.D.    Microbiology: No results found for this or any previous visit (from the past 240 hour(s)).   Labs: Basic Metabolic Panel:  Recent Labs Lab 03/31/13 2349  NA 133*  K 4.0  CL 99  CO2 21  GLUCOSE 257*  BUN 8  CREATININE 0.56  CALCIUM 9.0   Liver Function Tests: No results found for this basename: AST, ALT, ALKPHOS, BILITOT, PROT, ALBUMIN,  in the last 168 hours No results found for this basename: LIPASE, AMYLASE,  in the last 168 hours No results found for this basename: AMMONIA,  in the last 168 hours CBC:  Recent Labs Lab 03/31/13 2349  WBC 12.5*  HGB 12.0  HCT 34.7*  MCV 80.0  PLT 260   Cardiac Enzymes:  Recent Labs Lab 04/01/13 0845  TROPONINI <0.30   BNP: BNP (last 3 results)  Recent Labs  03/31/13 2345  PROBNP 170.4*   CBG:  Recent Labs Lab 04/01/13 0326 04/01/13 0752 04/01/13 1112  GLUCAP 266* 294* 197*       Signed:  Jonette Mate N  [facility] 04/01/2013, 11:53 AM

## 2013-04-01 NOTE — Progress Notes (Signed)
CRITICAL VALUE ALERT  Critical value received:  Troponin 0.53  Date of notification:  04/01/13  Time of notification:  1207  Critical value read back:yes  Nurse who received alert:  Bernadene Person, RN  MD notified (1st page):  Dr. York Spaniel    Time of first page:  1208  MD notified (2nd page): Dr. Jacinto Halim    Time of second page: 1209  Responding MD:  Dr. York Spaniel, Dr. Jacinto Halim    Time MD responded: 1209

## 2013-04-01 NOTE — ED Notes (Signed)
Admission MD at bedside.  

## 2013-04-01 NOTE — Progress Notes (Signed)
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  Per hospital policy blood was drawn for exposure panel. 

## 2013-04-01 NOTE — Progress Notes (Signed)
ANTICOAGULATION CONSULT NOTE   Pharmacy Consult for heparin Indication: chest pain/ACS  No Known Allergies  Labs:  Recent Labs  03/31/13 2349 04/01/13 0845 04/01/13 1000  HGB 12.0  --   --   HCT 34.7*  --   --   PLT 260  --   --   HEPARINUNFRC  --   --  0.30  CREATININE 0.56  --   --   TROPONINI  --  <0.30  --     Estimated Creatinine Clearance: 123.7 ml/min (by C-G formula based on Cr of 0.56).  Assessment: 42yo female had cardiac cath 32mo ago w/ chronic occluded RCA, had DES placed, now c/o CP, SOB, and diaphoresis though different in nature than previous event, initial troponin negative  HL therapeutic  Goal of Therapy:  Heparin level 0.3-0.7 units/ml Monitor platelets by anticoagulation protocol: Yes   Plan:  1) Continue heparin at 1300 units / hr 2) Follow up AM  Thank you. Okey Regal, PharmD [phone]  04/01/2013,11:44 AM

## 2013-04-01 NOTE — ED Notes (Signed)
Patient requested and provided Coke, saltine crackers and peanut butter

## 2013-04-01 NOTE — Consult Note (Addendum)
CARDIOLOGY CONSULT NOTE  Patient ID: Amy Chaney MRN: [id number] DOB/AGE: 43/29/1972 43 y.o.  Admit date: 03/31/2013 Referring Physician  [facility] Primary Physician:  Jackie Plum, MD Reason for Consultation  Chest pain  HPI: Patient is a 42 year old African American female with history of hypertension, hyperlipidemia, uncontrolled diabetes mellitus, morbid obesity and history of coronary angioplasty for a chronic total occlusion of the right coronary artery that was performed on 02/25/2013 with implantation of a long 3.0 x 38 mm overlap with a 3.5 x 16 mm promos drug-eluting stent. She stopped taking her Effient 4 days ago. She has been noncompliant with dietary discretion. She was at work when she developed chest discomfort which is described as sharp heaviness, similar to angina, however locates her finger to the left lower part of her chest. She was also dyspneic. Given this she came to the emergency room. She did receive one sublingual nitroglycerin via EMS, with no significant relief of chest discomfort. In the emergency room, chest pain was felt to be atypical and she was admitted to the hospital to exclude unstable angina and acute cord syndrome given her recent CAD history. Patient was at work when she developed chest discomfort. It was described as having sharp pain to heaviness lasting a few seconds to a few minutes on and off. No other associated symptoms except patient did state that she was having some difficulty breathing. No leg edema, no painful swelling of the lower extremity is, no syncope. Patient states that she's not had any further recurrence of chest pain. She states that she's doing well this morning. She denies any symptoms to suggest TIA or claudication. No recent bowel or bladder disturbances. No recent weight changes. She stills continues to smoke about one or 2 cigarettes a day  Past Medical History  Diagnosis Date  . Hypertension   . Coronary artery disease   . High  cholesterol   . Heart murmur   . Chest pain at rest     "and w/exertion; last couple months" (02/25/2013)  . Shortness of breath     "at rest and w/exertion last couple months" (02/25/2013)  . Type II diabetes mellitus 12/2010  . Daily headache   . Depression      Past Surgical History  Procedure Laterality Date  . Coronary angioplasty with stent placement  02/25/2013    "2" (02/25/2013)  . Cholecystectomy  ~ 2000  . Tubal ligation  1994     Family History  Problem Relation Age of Onset  . Diabetes Mother   . Diabetes Sister   . Diabetes Son     type 1  . Diabetes Sister   . Diabetes Son     type 2     Social History: History   Social History  . Marital Status: Single    Spouse Name: N/A    Number of Children: N/A  . Years of Education: N/A   Occupational History  . Not on file.   Social History Main Topics  . Smoking status: Current Every Day Smoker -- 1.00 packs/day for 15 years    Types: Cigarettes  . Smokeless tobacco: Never Used  . Alcohol Use: Yes     Comment: 02/25/2013 "only on special occasions; maybe once/yr"  . Drug Use: Yes    Special: Marijuana     Comment: 02/25/2013 "quit marijuana ~ 2001"  . Sexual Activity: Not Currently    Birth Control/ Protection: Surgical   Other Topics Concern  . Not on file  Social History Narrative  . No narrative on file     Prescriptions prior to admission  Medication Sig Dispense Refill  . amLODipine-valsartan (EXFORGE) 5-160 MG per tablet Take 2 tablets by mouth daily.      Marland Kitchen aspirin EC 81 MG tablet Take 81 mg by mouth daily.      . Aspirin-Acetaminophen-Caffeine (GOODY HEADACHE PO) Take 1 packet by mouth daily as needed (pain).      Marland Kitchen atenolol (TENORMIN) 100 MG tablet Take 100 mg by mouth daily.      Marland Kitchen atorvastatin (LIPITOR) 10 MG tablet Take 10 mg by mouth daily.      . ferrous sulfate 325 (65 FE) MG tablet Take 325 mg by mouth daily with breakfast.      . hydrALAZINE (APRESOLINE) 25 MG tablet Take 1 tablet  (25 mg total) by mouth 4 (four) times daily.  120 tablet  1  . hydrochlorothiazide (HYDRODIURIL) 25 MG tablet Take 1 tablet (25 mg total) by mouth daily.  90 tablet  3  . lisinopril (PRINIVIL,ZESTRIL) 20 MG tablet Take 20 mg by mouth daily.      . metFORMIN (GLUCOPHAGE) 500 MG tablet Take 1,000 mg by mouth 2 (two) times daily with a meal.       . nitroGLYCERIN (NITROSTAT) 0.4 MG SL tablet Place 0.4 mg under the tongue every 5 (five) minutes as needed for chest pain.      . prasugrel (EFFIENT) 10 MG TABS Take 1 tablet (10 mg total) by mouth daily.  30 tablet  0  . Vilazodone HCl (VIIBRYD) 40 MG TABS Take 40 mg by mouth See admin instructions. 14 day course to start 02/24/13 (once daily)        Scheduled Meds: . amLODipine  10 mg Oral Daily   And  . irbesartan  300 mg Oral Daily  . aspirin EC  81 mg Oral Daily  . atenolol  100 mg Oral Daily  . atorvastatin  10 mg Oral Daily  . docusate sodium  100 mg Oral BID  . hydrALAZINE  25 mg Oral QID  . hydrochlorothiazide  25 mg Oral Daily  . insulin aspart  0-15 Units Subcutaneous TID WC  . insulin aspart  0-5 Units Subcutaneous QHS  . lisinopril  20 mg Oral Daily  . metFORMIN  1,000 mg Oral BID WC  . prasugrel  10 mg Oral Daily  . sodium chloride  3 mL Intravenous Q12H  . sodium chloride  3 mL Intravenous Q12H   Continuous Infusions: . heparin 1,300 Units/hr (04/01/13 0417)   PRN Meds:.sodium chloride, morphine injection, ondansetron (ZOFRAN) IV, ondansetron, sodium chloride  ROS: General: no fevers/chills/night sweats Eyes: no blurry vision, diplopia, or amaurosis Other systems are negative. See history of present illness    Physical Exam: Blood pressure 142/68, pulse 74, temperature 97.6 F (36.4 C), temperature source Oral, resp. rate 18, height 5\' 9"  (1.753 m), weight 116.665 kg (257 lb 3.2 oz), last menstrual period 03/24/2013, SpO2 100.00%.  General appearance: alert, cooperative, appears older than stated age and no distress   Resp: clear to auscultation bilaterally  Chest exam: Mild tenderness in the left fifth and sixth costochondral junction. Cardio: regular rate and rhythm, S1, S2 normal, no murmur, click, rub or gallop  GI: soft, non-tender; bowel sounds normal; no masses, no organomegaly  Extremities: extremities normal, atraumatic, no cyanosis or edema  Pulses: 2+ and symmetric  Radial artery arterial access site without any complication and good pulse.  Neurologic: Grossly  normal  Labs:   Lab Results  Component Value Date   WBC 12.5* 03/31/2013   HGB 12.0 03/31/2013   HCT 34.7* 03/31/2013   MCV 80.0 03/31/2013   PLT 260 03/31/2013    Recent Labs Lab 03/31/13 2349  NA 133*  K 4.0  CL 99  CO2 21  BUN 8  CREATININE 0.56  CALCIUM 9.0  GLUCOSE 257*   Lab Results  Component Value Date   TROPONINI <0.30 04/01/2013    Lipid Panel     Component Value Date/Time   CHOL 157 09/14/2011 0900   TRIG 49.0 09/14/2011 0900   HDL 47.10 09/14/2011 0900   CHOLHDL 3 09/14/2011 0900   VLDL 9.8 09/14/2011 0900   LDLCALC 100* 09/14/2011 0900    EKG: 03/31/2013: Normal sinus rhythm at a rate of 75 beats a minute, normal intervals, LVH. Compared to EKG 02/26/2013, prolonged QT is no longer present. No ischemia.    Radiology: Dg Chest Port 1 View  03/31/2013   *RADIOLOGY REPORT*  Clinical Data: Chest pain  PORTABLE CHEST - 1 VIEW  Comparison: None available  Findings: Cardiac and mediastinal silhouettes are within normal limits.  Lungs are normally inflated.  No airspace consolidation, pleural effusion, or pulmonary edema is identified.  There is no pneumothorax.  No acute osseous abnormality.  IMPRESSION: No acute cardiopulmonary process.   Original Report Authenticated By: Rise Mu, M.D.      ASSESSMENT AND PLAN:  1. Chest pain, probably angina pectoris, probably stable. No recurrence since admission to the hospital. No evidence of ischemia by EKG and cardiac markers were negative. Chest and also has  atypical qualities with reproducible pain in the left lower quadrant. 2. CAD, S/P PTCA and stenting of the proximal and midsegment of the dominant RCA with implantation of a 3.0 x 38 mm in the midsegment overlapped proximally with a 3.5 x 16 mm promos Premier DES on 02/25/2013. 3. Tobacco use disorder 4. Noncompliance with medication, patient stopped taking Effient about 4-5 days ago. No explanation given. 5. Diabetes mellitus type 2 uncontrolled 6. Hypertension 7. Hyperlipidemia 8. Morbid obesity  Recommendation: Patient essentially asymptomatic without any EKG abnormalities and negative cardiac markers. I would recommend that we ambulate, if no further chest pain, can be discharged home with outpatient followup. I have discussed extensively with the patient in presence of the R.N. regarding being compliant with medications and also with the diet. Smoking cessation was discussed in detail. Patient advised to contact me if she has recurrence of chest pain. He is also advised to have sublingual nitroglycerin available with her at all times. Unless examination she has recurrence of chest pain, I presume she can be discharged home safely.  Cardiac Panel (last 3 results)  Recent Labs  04/01/13 0845 04/01/13 1047  TROPONINI <0.30 0.53*   Patient has positive cardiac markers per non-ST elevation myocardial infarction.  Patient will continue with IV heparin, I will set her up for coronary angiography this afternoon.  Pamella Pert, MD 04/01/2013, 10:58 AM [facility] Pager: [phone] Office: [phone] If no answer Cell [phone]

## 2013-04-01 NOTE — Progress Notes (Addendum)
[facility] PROGRESS NOTE  Amy Chaney WUJ:[id number] DOB: [birth year] DOA: 03/31/2013 PCP: Jackie Plum, MD  Assessment/Plan:  42 y.o. female with hx of hypercholesterolemia, HTN, CAD, last catherization one month ago, with chronic total occluded RCA, s/p stent placement with DES (Dr Jacinto Halim), severe depression, anxiety, obesity, having significant stress with her 29 yo daughter, presents to the ER with substernal chest pain.  - Evaluation in the ER showed EKG with SR and slight prolonged Qtc, no acute ischemic changes. CXR was clear and her troponin was negative. Her D-dimer was negative as well. She still has some residual chest pain in the ER. Hospitalist was asked to admit her for cardiac r/out.  -patient repots not taking medication ast home   Assessment and plan   1. Chest pain; recent CAD s/p PTCA RCA (not taking effient);  -possible NSTEMI r/o in stent thrombosis, patient non adherent to meds;  -cont prasugrel, BB, ACE, statin, and added IV heparin  -d/w cardiology; plan LHC  2. HTN labile BP;  -resumed home meds; titrate per response; prn hydralazine   3. DM HA1C 9.7 (01/2013);patient takes metformin at home (hold for [facility]) -recheck HA1C; add lantus 10 HQS, recheck A1C  4. Depression cont current regimen   5. Leukocytosis ? Etiology; no s/s/o infection monitor    Code Status: full  Family Communication: family not present at the bedside  Disposition Plan: pend evaluation    Consultants:  Dr. Jacinto Halim   Procedures:  Pend LHC   Antibiotics: No  HPI/Subjective: Patient alert, no chest pain, no SOB   Objective: Filed Vitals:   04/01/13 1054  BP: 142/68  Pulse:   Temp:   Resp:     Intake/Output Summary (Last 24 hours) at 04/01/13 1215 Last data filed at 04/01/13 0900  Gross per 24 hour  Intake    360 ml  Output      0 ml  Net    360 ml   Filed Weights   03/31/13 2313 04/01/13 0329  Weight: 260 lb (117.935 kg) 257 lb 3.2 oz (116.665 kg)     Exam:   General:  Alert   Cardiovascular: S1, S2 RRR  Respiratory: CTA BL   Abdomen: soft, obese, NT, ND   Musculoskeletal: no edema in LE   Data Reviewed: Basic Metabolic Panel:  Recent Labs Lab 03/31/13 2349  NA 133*  K 4.0  CL 99  CO2 21  GLUCOSE 257*  BUN 8  CREATININE 0.56  CALCIUM 9.0   Liver Function Tests: No results found for this basename: AST, ALT, ALKPHOS, BILITOT, PROT, ALBUMIN,  in the last 168 hours No results found for this basename: LIPASE, AMYLASE,  in the last 168 hours No results found for this basename: AMMONIA,  in the last 168 hours CBC:  Recent Labs Lab 03/31/13 2349  WBC 12.5*  HGB 12.0  HCT 34.7*  MCV 80.0  PLT 260   Cardiac Enzymes:  Recent Labs Lab 04/01/13 0845 04/01/13 1047  TROPONINI <0.30 0.53*   BNP (last 3 results)  Recent Labs  03/31/13 2345  PROBNP 170.4*   CBG:  Recent Labs Lab 04/01/13 0326 04/01/13 0752 04/01/13 1112  GLUCAP 266* 294* 197*    No results found for this or any previous visit (from the past 240 hour(s)).   Studies: Dg Chest Port 1 View  03/31/2013   *RADIOLOGY REPORT*  Clinical Data: Chest pain  PORTABLE CHEST - 1 VIEW  Comparison: None available  Findings: Cardiac and mediastinal silhouettes are  within normal limits.  Lungs are normally inflated.  No airspace consolidation, pleural effusion, or pulmonary edema is identified.  There is no pneumothorax.  No acute osseous abnormality.  IMPRESSION: No acute cardiopulmonary process.   Original Report Authenticated By: Rise Mu, M.D.    Scheduled Meds: . amLODipine  10 mg Oral Daily   And  . irbesartan  300 mg Oral Daily  . aspirin EC  81 mg Oral Daily  . atenolol  100 mg Oral Daily  . atorvastatin  10 mg Oral Daily  . docusate sodium  100 mg Oral BID  . hydrALAZINE  25 mg Oral QID  . hydrochlorothiazide  25 mg Oral Daily  . insulin aspart  0-15 Units Subcutaneous TID WC  . insulin aspart  0-5 Units Subcutaneous QHS   . lisinopril  20 mg Oral Daily  . metFORMIN  1,000 mg Oral BID WC  . prasugrel  10 mg Oral Daily  . sodium chloride  3 mL Intravenous Q12H  . sodium chloride  3 mL Intravenous Q12H   Continuous Infusions:   Active Problems:   Chest pain, mid sternal   Hypertension   DM2 (diabetes mellitus, type 2)   Hypercholesterolemia   Depression   Stress at home    Time spent: > 30 minutes     Esperanza Sheets  [facility] Pager [phone]. If 7PM-7AM, please contact night-coverage at [email or web address], password [facility] 04/01/2013, 12:15 PM  LOS: 1 day

## 2013-04-01 NOTE — Progress Notes (Signed)
ANTICOAGULATION CONSULT NOTE - Initial Consult  Pharmacy Consult for heparin Indication: chest pain/ACS  No Known Allergies  Patient Measurements: Height: 5\' 9"  (175.3 cm) Weight: 260 lb (117.935 kg) IBW/kg (Calculated) : 66.2 Heparin Dosing Weight: 95kg  Vital Signs: Temp: 98.1 F (36.7 C) (09/01 2313) Temp src: Oral (09/01 2313) BP: 111/63 mmHg (09/02 0245) Pulse Rate: 75 (09/02 0245)  Labs:  Recent Labs  03/31/13 2349  HGB 12.0  HCT 34.7*  PLT 260  CREATININE 0.56    Estimated Creatinine Clearance: 124.4 ml/min (by C-G formula based on Cr of 0.56).   Medical History: Past Medical History  Diagnosis Date  . Hypertension   . Coronary artery disease   . High cholesterol   . Heart murmur   . Chest pain at rest     "and w/exertion; last couple months" (02/25/2013)  . Shortness of breath     "at rest and w/exertion last couple months" (02/25/2013)  . Type II diabetes mellitus 12/2010  . Daily headache   . Depression     Medications:  Prescriptions prior to admission  Medication Sig Dispense Refill  . amLODipine-valsartan (EXFORGE) 5-160 MG per tablet Take 2 tablets by mouth daily.      Marland Kitchen aspirin EC 81 MG tablet Take 81 mg by mouth daily.      . Aspirin-Acetaminophen-Caffeine (GOODY HEADACHE PO) Take 1 packet by mouth daily as needed (pain).      Marland Kitchen atenolol (TENORMIN) 100 MG tablet Take 100 mg by mouth daily.      Marland Kitchen atorvastatin (LIPITOR) 10 MG tablet Take 10 mg by mouth daily.      . ferrous sulfate 325 (65 FE) MG tablet Take 325 mg by mouth daily with breakfast.      . hydrALAZINE (APRESOLINE) 25 MG tablet Take 1 tablet (25 mg total) by mouth 4 (four) times daily.  120 tablet  1  . hydrochlorothiazide (HYDRODIURIL) 25 MG tablet Take 1 tablet (25 mg total) by mouth daily.  90 tablet  3  . lisinopril (PRINIVIL,ZESTRIL) 20 MG tablet Take 20 mg by mouth daily.      . metFORMIN (GLUCOPHAGE) 500 MG tablet Take 1,000 mg by mouth 2 (two) times daily with a meal.        . nitroGLYCERIN (NITROSTAT) 0.4 MG SL tablet Place 0.4 mg under the tongue every 5 (five) minutes as needed for chest pain.      . prasugrel (EFFIENT) 10 MG TABS Take 1 tablet (10 mg total) by mouth daily.  30 tablet  0  . Vilazodone HCl (VIIBRYD) 40 MG TABS Take 40 mg by mouth See admin instructions. 14 day course to start 02/24/13 (once daily)       Scheduled:  . amLODipine  10 mg Oral Daily   And  . irbesartan  300 mg Oral Daily  . aspirin EC  81 mg Oral Daily  . atenolol  100 mg Oral Daily  . atorvastatin  10 mg Oral Daily  . docusate sodium  100 mg Oral BID  . hydrALAZINE  25 mg Oral QID  . hydrochlorothiazide  25 mg Oral Daily  . insulin aspart  0-15 Units Subcutaneous TID WC  . insulin aspart  0-5 Units Subcutaneous QHS  . lisinopril  20 mg Oral Daily  . metFORMIN  1,000 mg Oral BID WC  . prasugrel  10 mg Oral Daily  . sodium chloride  3 mL Intravenous Q12H  . sodium chloride  3 mL Intravenous Q12H  Assessment: 42yo female had cardiac cath 41mo ago w/ chronic occluded RCA, had DES placed, now c/o CP, SOB, and diaphoresis though different in nature than previous event, initial troponin negative, to begin heparin.  Goal of Therapy:  Heparin level 0.3-0.7 units/ml Monitor platelets by anticoagulation protocol: Yes   Plan:  Will give heparin 4000 units IV bolus x1 followed by gtt at 1300 units/hr and monitor heparin levels and CBC.  Vernard Gambles, PharmD, BCPS  04/01/2013,3:25 AM

## 2013-04-02 LAB — BASIC METABOLIC PANEL
BUN: 9 mg/dL (ref 6–23)
CO2: 21 mEq/L (ref 19–32)
Chloride: 105 mEq/L (ref 96–112)
GFR calc Af Amer: >90 mL/min (ref >90)
Potassium: 3.8 mEq/L (ref 3.5–5.1)

## 2013-04-02 LAB — CBC
HCT: 30.7 % — ABNORMAL LOW (ref 36.0–46.0)
Hemoglobin: 10.2 g/dL — ABNORMAL LOW (ref 12.0–15.0)
RBC: 3.81 MIL/uL — ABNORMAL LOW (ref 3.87–5.11)
RDW: 15.3 % (ref 11.5–15.5)
WBC: 11.1 10*3/uL — ABNORMAL HIGH (ref 4.0–10.5)

## 2013-04-02 LAB — GLUCOSE, CAPILLARY: Glucose-Capillary: 232 mg/dL — ABNORMAL HIGH (ref 70–99)

## 2013-04-02 MED ORDER — CEFAZOLIN SODIUM 1-5 GM-% IV SOLN
1.0000 g | Freq: Once | INTRAVENOUS | Status: AC
  Administered 2013-04-02: 10:00:00 1 g via INTRAVENOUS
  Filled 2013-04-02: qty 50

## 2013-04-02 MED ORDER — LIVING WELL WITH DIABETES BOOK
Freq: Once | Status: AC
  Administered 2013-04-02: 06:00:00
  Filled 2013-04-02: qty 1

## 2013-04-02 MED FILL — Sodium Chloride IV Soln 0.9%: INTRAVENOUS | Qty: 50 | Status: AC

## 2013-04-02 NOTE — Discharge Summary (Signed)
DISCHARGE SUMMARY  Amy Chaney  MR#: [id number]  DOB:[birth year]  Date of Admission: 03/31/2013 Date of Discharge: 04/02/2013  Attending Physician:Gonsalo Cuthbertson T  Patient's JYN:[id number],QMVHQI, MD  Consults: Pamella Pert, MD - Cardiology   Disposition: D/C home   Follow-up Appts:     Follow-up Information   Follow up with OSEI-BONSU,GEORGE, MD In 2 weeks.   Specialty:  Internal Medicine   Contact information:   [address], [address] [place] [address] [phone]       Follow up with Pamella Pert, MD In 1 week.   Specialty:  Cardiology   Contact information:   [address] [place] [address] [phone]       Discharge Diagnoses: Angina pectoris - occluded coronary stent  Hypertension DM2 (diabetes mellitus, type 2) Hypercholesterolemia Depression  Initial presentation: 42 y.o. female with hx of hypercholesterolemia, HTN, CAD, last catherization one month ago, with chronic total occluded RCA, s/p stent placement with DES (Dr Jacinto Halim), severe depression, anxiety, obesity, having significant stress with her 76 yo daughter, presented to the ER with substernal chest pain. She also had some shortness of breath and diaphoresis. She had no black or bloody stool, and no abdominal pain. Evaluation in the ER showed EKG with SR and slight prolonged QTc, no acute ischemic changes. CXR was clear and her troponin was negative. Her D-dimer was negative as well. She still had some residual chest pain in the ER.   Hospital Course: Pt was admitted for r/o MI.  Enzymes became mildly positive during her stay with troponin peak at 0.66.  EKG was w/o acute change.  She was placed on IV heparin.  Pt admitted she had been noncompliant with her ASA and Effient dosing, as previously prescribed by her Cardiolgist.  Dr. Jacinto Halim was consulted, and took the pt to cardiac cath.  The cath revealed "subacute stent thrombosis involving the midsegment of the right  coronary artery stent that was previously placed, history of PTCA and stenting of CTO of the RCA on 02/25/2013 with implantation of a long 3.0 x 38 mm overlap with a 3.5 x 16 mm promos drug-eluting stent."  This was tx with successful PTCA and stenting of the mid RCA with implantation of a 3.0 x 20 mm promos Premier DES. Stenosis reduced from 100% to 0% with brisk TIMI 0 to TIMI 3 flow at the end of the procedure. As per Cardiology the patient will need dual antiplatelet therapy with Effient and ASA 81 mg for at least one year.   The pt was advised to d/c smoking, and was referred to OP DM education, as well as oupt cardiac rehab.     Medication List    STOP taking these medications       GOODY HEADACHE PO      TAKE these medications       amLODipine-valsartan 5-160 MG per tablet  Commonly known as:  EXFORGE  Take 2 tablets by mouth daily.     aspirin EC 81 MG tablet  Take 81 mg by mouth daily.     atenolol 100 MG tablet  Commonly known as:  TENORMIN  Take 100 mg by mouth daily.     atorvastatin 10 MG tablet  Commonly known as:  LIPITOR  Take 10 mg by mouth daily.     ferrous sulfate 325 (65 FE) MG tablet  Take 325 mg by mouth daily with breakfast.     hydrALAZINE 25 MG tablet  Commonly known  as:  APRESOLINE  Take 1 tablet (25 mg total) by mouth 4 (four) times daily.     hydrochlorothiazide 25 MG tablet  Commonly known as:  HYDRODIURIL  Take 1 tablet (25 mg total) by mouth daily.     lisinopril 20 MG tablet  Commonly known as:  PRINIVIL,ZESTRIL  Take 20 mg by mouth daily.     metFORMIN 500 MG tablet  Commonly known as:  GLUCOPHAGE  Take 1,000 mg by mouth 2 (two) times daily with a meal.     nitroGLYCERIN 0.4 MG SL tablet  Commonly known as:  NITROSTAT  Place 0.4 mg under the tongue every 5 (five) minutes as needed for chest pain.     prasugrel 10 MG Tabs tablet  Commonly known as:  EFFIENT  Take 1 tablet (10 mg total) by mouth daily.     VIIBRYD 40 MG Tabs   Generic drug:  Vilazodone HCl  Take 40 mg by mouth See admin instructions. 14 day course to start 02/24/13 (once daily)        Day of Discharge BP 143/67  Pulse 60  Temp(Src) 97.7 F (36.5 C) (Oral)  Resp 18  Ht 5\' 9"  (1.753 m)  Wt 116.9 kg (257 lb 11.5 oz)  BMI 38.04 kg/m2  SpO2 99%  LMP 03/24/2013  Physical Exam: General: No acute respiratory distress Lungs: Clear to auscultation bilaterally without wheezes or crackles Cardiovascular: Regular rate and rhythm without murmur gallop or rub normal S1 and S2 Abdomen: Nontender, nondistended, soft, bowel sounds positive, no rebound, no ascites, no appreciable mass Extremities: No significant cyanosis, clubbing, or edema bilateral lower extremities  Results for orders placed during the hospital encounter of 03/31/13 (from the past 24 hour(s))  HEMOGLOBIN A1C     Status: Abnormal   Collection Time    04/01/13 12:52 PM      Result Value Range   Hemoglobin A1C 10.8 (*) <5.7 %   Mean Plasma Glucose 263 (*) <117 mg/dL  PROTIME-INR     Status: None   Collection Time    04/01/13 12:52 PM      Result Value Range   Prothrombin Time 13.1  11.6 - 15.2 seconds   INR 1.01  0.00 - 1.49  TROPONIN I     Status: Abnormal   Collection Time    04/01/13  3:20 PM      Result Value Range   Troponin I 0.66 (*) <0.30 ng/mL  GLUCOSE, CAPILLARY     Status: Abnormal   Collection Time    04/01/13  4:39 PM      Result Value Range   Glucose-Capillary 180 (*) 70 - 99 mg/dL  POCT ACTIVATED CLOTTING TIME     Status: None   Collection Time    04/01/13  5:58 PM      Result Value Range   Activated Clotting Time 401    GLUCOSE, CAPILLARY     Status: Abnormal   Collection Time    04/01/13  7:08 PM      Result Value Range   Glucose-Capillary 163 (*) 70 - 99 mg/dL  GLUCOSE, CAPILLARY     Status: Abnormal   Collection Time    04/01/13  9:53 PM      Result Value Range   Glucose-Capillary 227 (*) 70 - 99 mg/dL  CBC     Status: Abnormal   Collection  Time    04/02/13  5:40 AM      Result Value Range   WBC  11.1 (*) 4.0 - 10.5 K/uL   RBC 3.81 (*) 3.87 - 5.11 MIL/uL   Hemoglobin 10.2 (*) 12.0 - 15.0 g/dL   HCT 16.1 (*) 09.6 - 04.5 %   MCV 80.6  78.0 - 100.0 fL   MCH 26.8  26.0 - 34.0 pg   MCHC 33.2  30.0 - 36.0 g/dL   RDW 40.9  81.1 - 91.4 %   Platelets 282  150 - 400 K/uL  BASIC METABOLIC PANEL     Status: Abnormal   Collection Time    04/02/13  5:40 AM      Result Value Range   Sodium 137  135 - 145 mEq/L   Potassium 3.8  3.5 - 5.1 mEq/L   Chloride 105  96 - 112 mEq/L   CO2 21  19 - 32 mEq/L   Glucose, Bld 316 (*) 70 - 99 mg/dL   BUN 9  6 - 23 mg/dL   Creatinine, Ser 7.82  0.50 - 1.10 mg/dL   Calcium 8.4  8.4 - 95.6 mg/dL   GFR calc non Af Amer >90  >90 mL/min   GFR calc Af Amer >90  >90 mL/min  GLUCOSE, CAPILLARY     Status: Abnormal   Collection Time    04/02/13  8:45 AM      Result Value Range   Glucose-Capillary 231 (*) 70 - 99 mg/dL    Time spent in discharge (includes decision making & examination of pt): 30 minutes  04/02/2013, 12:32 PM   Lonia Blood, MD [facility] Office  [phone] Pager [phone]  On-Call/Text Page:      [email or web address]      password [facility]

## 2013-04-02 NOTE — Clinical Social Work Note (Signed)
CSW was asked by nurse to speak with patient as she had questions about food stamps. CSW visited room and talked with patient about her questions. Patient reports that she receives $23 in food stamps per works and [place] at [place] [employer] [place] She was hopeful that CSW could talk with her food stamp worker to request more benefits as she needs to change her diet. CSW clarified with patient how food stamps benefits are determined and suggested that she talk with her worker if her work schedule will change or if she will need to be out of work to recuperate from this hospitalization.  Patient thanked CSW for the visit.  Genelle Bal, MSW, LCSW [phone]

## 2013-04-02 NOTE — Progress Notes (Signed)
CARDIAC REHAB PHASE I   PRE:  Rate/Rhythm: 68SR  BP:  Supine: 142/70  Sitting:   Standing:    SaO2:   MODE:  Ambulation: 600 ft   POST:  Rate/Rhythm: 80SR  BP:  Supine:   Sitting: 143/67  Standing:    SaO2:  0750-0845 Pt walked 600 ft on RA with steady slow pace. C/o burning in chest after walk like indigestion that was relieved with rest. Re enforced ed done in July. Pt stated how to take NTG tablets, ex guidelines and had decreased cigarettes to 2 a day from a pack. Discussed with pt that she has to take effient. She stated she did not like the way pills feel on the back of her mouth. Discussed with pt putting pills in sugar free pudding or apple sauce as she had to take it. Stressed again that she could have another MI if not taken. Pt would like glucometer to check sugars. Pt did not attend CRP 2 before but states she knows she needs to attend and will try to make a priority. Will refer to [place] again.   Luetta Nutting, RN BSN  04/02/2013 8:42 AM

## 2013-04-02 NOTE — Progress Notes (Signed)
Subjective:  No further chest pain. Has had GERD, unlike her anginal pain.  Objective:  Vital Signs in the last 24 hours: Temp:  [97.1 F (36.2 C)-98.2 F (36.8 C)] 97.7 F (36.5 C) (09/03 0848) Pulse Rate:  [48-65] 60 (09/03 0848) Resp:  [16-18] 18 (09/03 0848) BP: (86-143)/(45-68) 143/67 mmHg (09/03 0848) SpO2:  [99 %-100 %] 99 % (09/03 0848) Weight:  [116.9 kg (257 lb 11.5 oz)] 116.9 kg (257 lb 11.5 oz) (09/03 0003)  Intake/Output from previous day: 09/02 0701 - 09/03 0700 In: 2135.6 [P.O.:1200; I.V.:935.6] Out: 500 [Urine:500]  Physical Exam:  General appearance: alert, cooperative, appears older than stated age and no distress  Resp: clear to auscultation bilaterally  Chest exam: Mild tenderness in the left fifth and sixth costochondral junction.  Cardio: regular rate and rhythm, S1, S2 normal, II/VI SEM in apex, No click, rub or gallop  GI: soft, non-tender; bowel sounds normal; no masses, no organomegaly  Extremities: extremities normal, atraumatic, no cyanosis or edema  Pulses: 2+ and symmetric. Right groin without hematoma. Radial artery arterial access site without any complication and good pulse.  Neurologic: Grossly normal  Lab Results:  Recent Labs  03/31/13 2349 04/02/13 0540  WBC 12.5* 11.1*  HGB 12.0 10.2*  PLT 260 282    Recent Labs  03/31/13 2349 04/02/13 0540  NA 133* 137  K 4.0 3.8  CL 99 105  CO2 21 21  GLUCOSE 257* 316*  BUN 8 9  CREATININE 0.56 0.60    Recent Labs  04/01/13 1047 04/01/13 1520  TROPONINI 0.53* 0.66*   Hepatic Function Panel No results found for this basename: PROT, ALBUMIN, AST, ALT, ALKPHOS, BILITOT, BILIDIR, IBILI,  in the last 72 hours No results found for this basename: CHOL,  in the last 72 hours No results found for this basename: PROTIME,  in the last 72 hours Lipid Panel     Component Value Date/Time   CHOL 157 09/14/2011 0900   TRIG 49.0 09/14/2011 0900   HDL 47.10 09/14/2011 0900   CHOLHDL 3  09/14/2011 0900   VLDL 9.8 09/14/2011 0900   LDLCALC 100* 09/14/2011 0900   Cardiac Panel (last 3 results)  Recent Labs  04/01/13 0845 04/01/13 1047 04/01/13 1520  TROPONINI <0.30 0.53* 0.66*    EKG: S. Bradycardia, unchanged from previous tracings. No ischemia. LVH   Assessment/Plan:  1. NSTEMI with subacute stent thrombosis due to non compliance of Effient. 2. DM-2 referral to OP diet consultation. 3. Smoking: Discussed abstinance. Rec: Okay to discharge home with OP f/u. Thanks for consultation.   Pamella Pert, M.D. 04/02/2013, 8:54 AM [facility] Pager: [phone] Office: [phone] If no answer: [phone]

## 2013-04-02 NOTE — Plan of Care (Signed)
Problem: Food- and Nutrition-Related Knowledge Deficit (NB-1.1) Goal: Nutrition education Formal process to instruct or train a patient/client in a skill or to impart knowledge to help patients/clients voluntarily manage or modify food choices and eating behavior to maintain or improve health. Outcome: Completed/Met Date Met:  04/02/13  RD consulted for nutrition education regarding diabetes.   Per pt she was never educated. Pt has limited food stamps. Pt drinks mostly Mt. Dew. Pt agreeable to stop drinking soda. Pt states she know she needs to make changes to live.     Lab Results  Component Value Date    HGBA1C 10.8* 04/01/2013    RD provided "Carbohydrate Counting for People with Diabetes" handout from the Academy of Nutrition and Dietetics. Discussed different food groups and their effects on blood sugar, emphasizing carbohydrate-containing foods. Provided list of carbohydrates and recommended serving sizes of common foods.  Discussed importance of controlled and consistent carbohydrate intake throughout the day. Provided examples of ways to balance meals/snacks and encouraged intake of high-fiber, whole grain complex carbohydrates. Teach back method used.  Expect fair compliance.  Body mass index is 38.04 kg/(m^2). Pt meets criteria for Obesity Class II based on current BMI.  Current diet order is CHO Modified, patient is consuming approximately 100% of meals at this time. Labs and medications reviewed. No further nutrition interventions warranted at this time. RD contact information provided. If additional nutrition issues arise, please re-consult RD.  Kendell Bane RD, LDN, CNSC [phone] Pager [phone] After Hours Pager

## 2013-04-17 ENCOUNTER — Ambulatory Visit (HOSPITAL_COMMUNITY): Payer: Medicaid Other

## 2013-04-21 ENCOUNTER — Ambulatory Visit (HOSPITAL_COMMUNITY): Payer: Medicaid Other

## 2013-04-23 ENCOUNTER — Ambulatory Visit (HOSPITAL_COMMUNITY): Payer: Medicaid Other

## 2013-04-23 ENCOUNTER — Ambulatory Visit: Payer: Medicaid Other | Admitting: *Deleted

## 2013-04-25 ENCOUNTER — Ambulatory Visit (HOSPITAL_COMMUNITY): Payer: Medicaid Other

## 2013-04-28 ENCOUNTER — Ambulatory Visit (HOSPITAL_COMMUNITY): Payer: Medicaid Other

## 2013-04-30 ENCOUNTER — Ambulatory Visit (HOSPITAL_COMMUNITY): Payer: Medicaid Other

## 2013-05-02 ENCOUNTER — Ambulatory Visit (HOSPITAL_COMMUNITY): Payer: Medicaid Other

## 2013-05-05 ENCOUNTER — Ambulatory Visit (HOSPITAL_COMMUNITY): Payer: Medicaid Other

## 2013-05-07 ENCOUNTER — Ambulatory Visit (HOSPITAL_COMMUNITY): Payer: Medicaid Other

## 2013-05-09 ENCOUNTER — Ambulatory Visit (HOSPITAL_COMMUNITY): Payer: Medicaid Other

## 2013-05-12 ENCOUNTER — Ambulatory Visit (HOSPITAL_COMMUNITY): Payer: Medicaid Other

## 2013-05-14 ENCOUNTER — Ambulatory Visit (HOSPITAL_COMMUNITY): Payer: Medicaid Other

## 2013-05-14 ENCOUNTER — Encounter: Payer: Medicaid Other | Attending: Internal Medicine | Admitting: Dietician

## 2013-05-14 VITALS — Ht 69.0 in | Wt 246.7 lb

## 2013-05-14 DIAGNOSIS — Z713 Dietary counseling and surveillance: Secondary | ICD-10-CM | POA: Insufficient documentation

## 2013-05-14 DIAGNOSIS — E119 Type 2 diabetes mellitus without complications: Secondary | ICD-10-CM | POA: Insufficient documentation

## 2013-05-15 ENCOUNTER — Encounter: Payer: Self-pay | Admitting: Dietician

## 2013-05-15 NOTE — Patient Instructions (Signed)
Goals:  Monitor glucose levels as instructed by your doctor 

## 2013-05-15 NOTE — Progress Notes (Signed)
Patient was seen on 05/14/2013 for the first of a series of three diabetes self-management courses at the [facility].  Current HbA1c: 10.8 on 9/2  The following learning objectives were met by the patient during this class:  Describe diabetes  State some common risk factors for diabetes  Defines the role of glucose and insulin  Identifies type of diabetes and pathophysiology  Describe the relationship between diabetes and cardiovascular risk  State the members of the Healthcare Team  States the rationale for glucose monitoring  State when to test glucose  State their individual Target Range  State the importance of logging glucose readings  Describe how to interpret glucose readings  Identifies A1C target  Explain the correlation between A1c and eAG values  State symptoms and treatment of high blood glucose  State symptoms and treatment of low blood glucose  Explain proper technique for glucose testing  Identifies proper sharps disposal  Handouts given during class include:  Living Well with Diabetes book  Carb Counting and Meal Planning book  Meal Plan Card  Carbohydrate guide  Meal planning worksheet  Low Sodium Flavoring Tips  The diabetes portion plate  Low Carbohydrate Snack Suggestions  A1c to eAG Conversion Chart  Diabetes Medications  Stress Management  Diabetes Recommended Care Schedule  Diabetes Success Plan  Core Class Satisfaction Survey  Your patient has identified their diabetes care support plan as:  [facility]  Staff   Follow-Up Plan:  Attend core 2

## 2013-05-16 ENCOUNTER — Ambulatory Visit (HOSPITAL_COMMUNITY): Payer: Medicaid Other

## 2013-05-19 ENCOUNTER — Ambulatory Visit (HOSPITAL_COMMUNITY): Payer: Medicaid Other

## 2013-05-21 ENCOUNTER — Ambulatory Visit (HOSPITAL_COMMUNITY): Payer: Medicaid Other

## 2013-05-21 ENCOUNTER — Encounter: Payer: Medicaid Other | Admitting: Dietician

## 2013-05-21 DIAGNOSIS — E119 Type 2 diabetes mellitus without complications: Secondary | ICD-10-CM

## 2013-05-21 NOTE — Progress Notes (Signed)
Patient was seen on 05/21/13 for the second of a series of three diabetes self-management courses at the [facility]. The following learning objectives were met by the patient during this class:   Describe the role of different macronutrients on glucose  Explain how carbohydrates affect blood glucose  State what foods contain the most carbohydrates  Demonstrate carbohydrate counting  Demonstrate how to read Nutrition Facts food label  Describe effects of various fats on heart health  Describe the importance of good nutrition for health and healthy eating strategies  Describe techniques for managing your shopping, cooking and meal planning  List strategies to follow meal plan when dining out  Describe the effects of alcohol on glucose and how to use it safely  Follow-Up Plan:  Attend Core 3  Work towards following your personal food plan.

## 2013-05-21 NOTE — Patient Instructions (Signed)
Goals:  Follow Diabetes Meal Plan as instructed  Eat 3 meals and 2 snacks, every 3-5 hrs  Limit carbohydrate intake to 30-45 grams carbohydrate/meal  Limit carbohydrate intake to 15 grams carbohydrate/snack  Add lean protein foods to meals/snacks  Monitor glucose levels as instructed by your doctor  Aim for 30 mins of physical activity daily  

## 2013-05-23 ENCOUNTER — Ambulatory Visit (HOSPITAL_COMMUNITY): Payer: Medicaid Other

## 2013-05-26 ENCOUNTER — Ambulatory Visit (HOSPITAL_COMMUNITY): Payer: Medicaid Other

## 2013-05-28 ENCOUNTER — Ambulatory Visit (HOSPITAL_COMMUNITY): Payer: Medicaid Other

## 2013-05-28 DIAGNOSIS — E119 Type 2 diabetes mellitus without complications: Secondary | ICD-10-CM

## 2013-05-28 NOTE — Progress Notes (Signed)
Patient was seen on 05/28/13 for the third of a series of three diabetes self-management courses at the [facility]. The following learning objectives were met by the patient during this class:    State the amount of activity recommended for healthy living   Describe activities suitable for individual needs   Identify ways to regularly incorporate activity into daily life   Identify barriers to activity and ways to over come these barriers  Identify diabetes medications being personally used and their primary action for lowering glucose and possible side effects   Describe role of stress on blood glucose and develop strategies to address psychosocial issues   Identify diabetes complications and ways to prevent them  Explain how to manage diabetes during illness   Evaluate success in meeting personal goal   Establish 2-3 goals that they will plan to diligently work on until they return for the free 42-month follow-up visit  Your patient has established the following 4 month goals in their individualized success plan:  I will reduce fat in my diet by eating less fried foods  I will walk 30 minutes 5 times a week  Your patient has identified these potential barriers to change:  time  Your patient has identified their diabetes self-care support plan as  family

## 2013-05-30 ENCOUNTER — Ambulatory Visit (HOSPITAL_COMMUNITY): Payer: Medicaid Other

## 2013-06-02 ENCOUNTER — Ambulatory Visit (HOSPITAL_COMMUNITY): Payer: Medicaid Other

## 2013-06-04 ENCOUNTER — Ambulatory Visit (HOSPITAL_COMMUNITY): Payer: Medicaid Other

## 2013-06-06 ENCOUNTER — Ambulatory Visit (HOSPITAL_COMMUNITY): Payer: Medicaid Other

## 2013-06-09 ENCOUNTER — Ambulatory Visit (HOSPITAL_COMMUNITY): Payer: Medicaid Other

## 2013-06-11 ENCOUNTER — Ambulatory Visit (HOSPITAL_COMMUNITY): Payer: Medicaid Other

## 2013-06-13 ENCOUNTER — Ambulatory Visit (HOSPITAL_COMMUNITY): Payer: Medicaid Other

## 2013-06-16 ENCOUNTER — Ambulatory Visit (HOSPITAL_COMMUNITY): Payer: Medicaid Other

## 2013-06-18 ENCOUNTER — Ambulatory Visit (HOSPITAL_COMMUNITY): Payer: Medicaid Other

## 2013-06-20 ENCOUNTER — Ambulatory Visit (HOSPITAL_COMMUNITY): Payer: Medicaid Other

## 2013-06-23 ENCOUNTER — Ambulatory Visit (HOSPITAL_COMMUNITY): Payer: Medicaid Other

## 2013-06-25 ENCOUNTER — Ambulatory Visit (HOSPITAL_COMMUNITY): Payer: Medicaid Other

## 2013-06-27 ENCOUNTER — Ambulatory Visit (HOSPITAL_COMMUNITY): Payer: Medicaid Other

## 2013-06-30 ENCOUNTER — Ambulatory Visit (HOSPITAL_COMMUNITY): Payer: Medicaid Other

## 2013-07-02 ENCOUNTER — Ambulatory Visit (HOSPITAL_COMMUNITY): Payer: Medicaid Other

## 2013-07-04 ENCOUNTER — Ambulatory Visit (HOSPITAL_COMMUNITY): Payer: Medicaid Other

## 2013-07-07 ENCOUNTER — Ambulatory Visit (HOSPITAL_COMMUNITY): Payer: Medicaid Other

## 2013-07-09 ENCOUNTER — Ambulatory Visit (HOSPITAL_COMMUNITY): Payer: Medicaid Other

## 2013-07-11 ENCOUNTER — Ambulatory Visit (HOSPITAL_COMMUNITY): Payer: Medicaid Other

## 2013-10-02 ENCOUNTER — Ambulatory Visit: Payer: Medicaid Other | Admitting: *Deleted

## 2014-06-01 ENCOUNTER — Encounter: Payer: Self-pay | Admitting: Dietician

## 2014-07-09 ENCOUNTER — Encounter (HOSPITAL_COMMUNITY): Payer: Self-pay | Admitting: Cardiology

## 2014-11-09 ENCOUNTER — Encounter (HOSPITAL_COMMUNITY): Payer: Self-pay

## 2014-11-09 ENCOUNTER — Emergency Department (HOSPITAL_COMMUNITY)
Admission: EM | Admit: 2014-11-09 | Discharge: 2014-11-09 | Disposition: A | Discharge status: Home / Self Care | Payer: Medicaid Other | Attending: Emergency Medicine | Admitting: Emergency Medicine

## 2014-11-09 DIAGNOSIS — R011 Cardiac murmur, unspecified: Secondary | ICD-10-CM | POA: Insufficient documentation

## 2014-11-09 DIAGNOSIS — F329 Major depressive disorder, single episode, unspecified: Secondary | ICD-10-CM | POA: Insufficient documentation

## 2014-11-09 DIAGNOSIS — Z7902 Long term (current) use of antithrombotics/antiplatelets: Secondary | ICD-10-CM | POA: Insufficient documentation

## 2014-11-09 DIAGNOSIS — I2510 Atherosclerotic heart disease of native coronary artery without angina pectoris: Secondary | ICD-10-CM | POA: Insufficient documentation

## 2014-11-09 DIAGNOSIS — E780 Pure hypercholesterolemia: Secondary | ICD-10-CM | POA: Insufficient documentation

## 2014-11-09 DIAGNOSIS — Z9861 Coronary angioplasty status: Secondary | ICD-10-CM | POA: Insufficient documentation

## 2014-11-09 DIAGNOSIS — M545 Low back pain, unspecified: Secondary | ICD-10-CM

## 2014-11-09 DIAGNOSIS — I10 Essential (primary) hypertension: Secondary | ICD-10-CM | POA: Insufficient documentation

## 2014-11-09 DIAGNOSIS — Z9889 Other specified postprocedural states: Secondary | ICD-10-CM | POA: Insufficient documentation

## 2014-11-09 DIAGNOSIS — E119 Type 2 diabetes mellitus without complications: Secondary | ICD-10-CM | POA: Insufficient documentation

## 2014-11-09 DIAGNOSIS — Z7982 Long term (current) use of aspirin: Secondary | ICD-10-CM | POA: Insufficient documentation

## 2014-11-09 DIAGNOSIS — Z79899 Other long term (current) drug therapy: Secondary | ICD-10-CM | POA: Insufficient documentation

## 2014-11-09 DIAGNOSIS — Z720 Tobacco use: Secondary | ICD-10-CM | POA: Insufficient documentation

## 2014-11-09 MED ORDER — KETOROLAC TROMETHAMINE 60 MG/2ML IM SOLN
60.0000 mg | Freq: Once | INTRAMUSCULAR | Status: AC
  Administered 2014-11-09: 60 mg via INTRAMUSCULAR
  Filled 2014-11-09: qty 2

## 2014-11-09 MED ORDER — HYDROCHLOROTHIAZIDE 12.5 MG PO TABS: 12.5000 mg | ORAL_TABLET | Freq: Every day | ORAL | Status: DC

## 2014-11-09 MED ORDER — NAPROXEN 500 MG PO TABS: 500.0000 mg | ORAL_TABLET | Freq: Two times a day (BID) | ORAL | Status: DC

## 2014-11-09 NOTE — ED Notes (Addendum)
Pt reports she has been out of the BP meds for 2 years because she does not have insurance. Pt. BP  190/115 on arrival TO ED. Care manager to see Pt before discharge home.

## 2014-11-09 NOTE — Discharge Planning (Signed)
Elfa Wooton J. Clydene Laming, RN, [place], [place] [phone] ED CM consulted to meet with patient concerning f/u care with PCP and patient does not have insurance. Pt presented to [facility] ED today with toothache and hig blood pressure.  Met with patient at bedside, confirmed informaton. Pt  reports not having access to f/u care with PCP, or insurance coverage. Discussed with patient importance and benefits of  establishing PCP, and not utilizing the ED for primary care needs. Pt verbalized understanding and is in agreement. Discussed other options, provided list of local  affordable PCPs.  Pt voiced interest in the [facility] and [place].  [facility] Brochure given with address, phone number, and the services highlighted. Explained that there is a Customer service manager on site who will assist with [employer] and process. Instructed to that appointment is 4/20 @1400 . Pt verbalized understanding.

## 2014-11-09 NOTE — ED Notes (Signed)
Declined W/C at D/C and was escorted to lobby by RN. 

## 2014-11-09 NOTE — ED Notes (Signed)
Pt. Reports falling on tailbone x1 month ago and has been having increase in pain to tailbone and low back. Reports intermittent radiation to left leg. States pain is worse when moving from sitting to standing position.

## 2014-11-09 NOTE — ED Notes (Signed)
PT reports the injury occurred November 2015. Pt states she fell in grass while at work and her tail bone pain has increased, Pain 8/10

## 2014-11-09 NOTE — ED Provider Notes (Signed)
CSN: [id number]     Arrival date & time 11/09/14  1150 History  This chart was scribed for non-physician practitioner Comer Locket, PA-C working with Virgel Manifold, MD by Zola Button, ED Scribe. This patient was seen in room TR09C/TR09C and the patient's care was started at 1:24 PM.      Chief Complaint  Patient presents with  . Tailbone Pain   The history is provided by the patient. No language interpreter was used.    HPI Comments: Amy Chaney is a 44 y.o. female with a hx of hypertension who presents to the Emergency Department complaining of sudden onset, worsening tailbone pain radiating to her lower back secondary to a fall onto grass that occurred 5 months ago. She describes the pain as an 8/10 in severity. The pain is worse when sitting down. She has been taking Tylenol, goody powder and Aleve with relief; she has not tried heat. She has not been medically evaluated for this before. No other aggravating or modifying factors.   Past Medical History  Diagnosis Date  . Hypertension   . Coronary artery disease   . High cholesterol   . Heart murmur   . Chest pain at rest     "and w/exertion; last couple months" (02/25/2013)  . Shortness of breath     "at rest and w/exertion last couple months" (02/25/2013)  . Type II diabetes mellitus 12/2010  . Daily headache   . Depression    Past Surgical History  Procedure Laterality Date  . Coronary angioplasty with stent placement  02/25/2013    "2" (02/25/2013)  . Cholecystectomy  ~ 2000  . Tubal ligation  1994  . Left heart catheterization with coronary angiogram N/A 02/25/2013    Procedure: LEFT HEART CATHETERIZATION WITH CORONARY ANGIOGRAM;  Surgeon: Laverda Page, MD;  Location: [facility] CATH LAB;  Service: Cardiovascular;  Laterality: N/A;  . Left heart catheterization with coronary angiogram N/A 04/01/2013    Procedure: LEFT HEART CATHETERIZATION WITH CORONARY ANGIOGRAM;  Surgeon: Laverda Page, MD;  Location: [facility] CATH LAB;   Service: Cardiovascular;  Laterality: N/A;  . Percutaneous coronary stent intervention (pci-s)  04/01/2013    Procedure: PERCUTANEOUS CORONARY STENT INTERVENTION (PCI-S);  Surgeon: Laverda Page, MD;  Location: [facility] CATH LAB;  Service: Cardiovascular;;   Family History  Problem Relation Age of Onset  . Diabetes Mother   . Diabetes Sister   . Diabetes Son     type 1  . Diabetes Sister   . Diabetes Son     type 2   History  Substance Use Topics  . Smoking status: Current Every Day Smoker -- 0.50 packs/day for 15 years    Types: Cigarettes  . Smokeless tobacco: Never Used  . Alcohol Use: Yes     Comment: 02/25/2013 "only on special occasions; maybe once/yr"   OB History    Gravida Para Term Preterm AB TAB SAB Ectopic Multiple Living   3 3 3  0 0 0 0 0 0 3     Review of Systems  Constitutional: Negative for fever.  Respiratory: Negative for shortness of breath.   Cardiovascular: Negative for chest pain.  Musculoskeletal: Positive for back pain.  Skin: Negative for rash.  Neurological: Negative for weakness and numbness.      Allergies  Review of patient's allergies indicates no known allergies.  Home Medications   Prior to Admission medications   Medication Sig Start Date End Date Taking? Authorizing Provider  amLODipine-valsartan (EXFORGE) 5-160 MG  per tablet Take 2 tablets by mouth daily. 02/26/13   Adrian Prows, MD  aspirin EC 81 MG tablet Take 81 mg by mouth daily.    Historical Provider, MD  atenolol (TENORMIN) 100 MG tablet Take 100 mg by mouth daily.    Historical Provider, MD  atorvastatin (LIPITOR) 10 MG tablet Take 10 mg by mouth daily.    Historical Provider, MD  ferrous sulfate 325 (65 FE) MG tablet Take 325 mg by mouth daily with breakfast.    Historical Provider, MD  hydrALAZINE (APRESOLINE) 25 MG tablet Take 1 tablet (25 mg total) by mouth 4 (four) times daily. 02/26/13   Adrian Prows, MD  hydrochlorothiazide (HYDRODIURIL) 12.5 MG tablet Take 1 tablet (12.5 mg  total) by mouth daily. 11/09/14   Comer Locket, PA-C  hydrochlorothiazide (HYDRODIURIL) 25 MG tablet Take 1 tablet (25 mg total) by mouth daily. 09/29/11   Sherren Mocha, MD  lisinopril (PRINIVIL,ZESTRIL) 20 MG tablet Take 20 mg by mouth daily.    Historical Provider, MD  metFORMIN (GLUCOPHAGE) 500 MG tablet Take 1,000 mg by mouth 2 (two) times daily with a meal.     Historical Provider, MD  naproxen (NAPROSYN) 500 MG tablet Take 1 tablet (500 mg total) by mouth 2 (two) times daily. 11/09/14   Comer Locket, PA-C  nitroGLYCERIN (NITROSTAT) 0.4 MG SL tablet Place 0.4 mg under the tongue every 5 (five) minutes as needed for chest pain.    Historical Provider, MD  prasugrel (EFFIENT) 10 MG TABS Take 1 tablet (10 mg total) by mouth daily. 02/26/13   Adrian Prows, MD  Vilazodone HCl (VIIBRYD) 40 MG TABS Take 40 mg by mouth See admin instructions. 14 day course to start 02/24/13 (once daily)    Historical Provider, MD   BP 179/84 mmHg  Pulse 66  Temp(Src) 97.7 F (36.5 C) (Oral)  Resp 16  Ht 5\' 9"  (1.753 m)  SpO2 100%  LMP 10/09/2014 Physical Exam  Constitutional: She is oriented to person, place, and time. She appears well-developed and well-nourished. No distress.  HENT:  Head: Normocephalic and atraumatic.  Mouth/Throat: Oropharynx is clear and moist. No oropharyngeal exudate.  Eyes: Pupils are equal, round, and reactive to light.  Neck: Normal range of motion. Neck supple.  Cardiovascular: Normal rate.   Pulmonary/Chest: Effort normal and breath sounds normal. No respiratory distress. She has no wheezes. She has no rales.  CTAB.  Abdominal: Soft. There is no tenderness.  Musculoskeletal: She exhibits tenderness. She exhibits no edema.  Diffuse tenderness to paraspinal lumbar region, no midline bony tenderness. No crepitus or step-off. No lesions or deformities. Maintains full active range of motion of cervical, thoracic and lumbar spine as well as bilateral lower extremities and hips.   Neurological: She is alert and oriented to person, place, and time. No cranial nerve deficit.  Gait baseline. Motor and sensation intact and equal bilaterally. Cranial nerves II through XII grossly intact.  Skin: Skin is warm and dry. No rash noted.  Psychiatric: She has a normal mood and affect. Her behavior is normal.  Nursing note and vitals reviewed.   ED Course  Procedures  DIAGNOSTIC STUDIES: Oxygen Saturation is 96% on room air, adequate by my interpretation.    COORDINATION OF CARE: 1:28 PM-Discussed treatment plan which includes medications with pt at bedside and pt agreed to plan.   Labs Review Labs Reviewed - No data to display  Imaging Review No results found.   EKG Interpretation None     Meds given  in ED:  Medications  ketorolac (TORADOL) injection 60 mg (60 mg Intramuscular Given 11/09/14 1332)    New Prescriptions   HYDROCHLOROTHIAZIDE (HYDRODIURIL) 12.5 MG TABLET    Take 1 tablet (12.5 mg total) by mouth daily.   NAPROXEN (NAPROSYN) 500 MG TABLET    Take 1 tablet (500 mg total) by mouth 2 (two) times daily.   Filed Vitals:   11/09/14 1225 11/09/14 1359  BP: 190/115 179/84  Pulse: 86 66  Temp: 98.4 F (36.9 C) 97.7 F (36.5 C)  TempSrc: Oral Oral  Resp: 16 16  Height: 5\' 9"  (1.753 m)   SpO2: 96% 100%    MDM  Vitals stable  -afebrile, blood pressure reducing spontaneously in ED without intervention.  Patient is hypertensive and reports that she's been out of blood pressure medicine for 2 years. Case management involved to assist with blood pressure medications. Patient has follow-up established with Dr. Verl Blalock. Pt resting comfortably in ED. reports Toradol injection worked well. PE--diffuse paraspinal lumbar tenderness. No overt midline bony tenderness. Normal neuro exam. Physical exam otherwise grossly benign.  DDX--patient presents with tailbone discomfort that she attributes to a fall in November 2015. Exam today consistent with musculoskeletal  strain. No evidence of other acute or emergent pathology. Discussed therapy with anti-inflammatories, stretching, heat therapy and follow-up with primary care for further evaluation and management of symptoms.  HCTZ re-prescribed for blood pressure management.  I discussed all relevant lab findings and imaging results with pt and they verbalized understanding. Discussed f/u with PCP within 48 hrs and return precautions, pt very amenable to plan.  Final diagnoses:  Bilateral low back pain without sciatica  Essential hypertension    I personally performed the services described in this documentation, which was scribed in my presence. The recorded information has been reviewed and is accurate.    Comer Locket, PA-C 11/09/14 [place], MD 11/09/14 [phone]

## 2014-11-09 NOTE — Discharge Instructions (Signed)
Back Injury Prevention °Back injuries can be extremely painful and difficult to heal. After having one back injury, you are much more likely to experience another later on. It is important to learn how to avoid injuring or re-injuring your back. The following tips can help you to prevent a back injury. °PHYSICAL FITNESS °· Exercise regularly and try to develop good tone in your abdominal muscles. Your abdominal muscles provide a lot of the support needed by your back. °· Do aerobic exercises (walking, jogging, biking, swimming) regularly. °· Do exercises that increase balance and strength (tai chi, yoga) regularly. This can decrease your risk of falling and injuring your back. °· Stretch before and after exercising. °· Maintain a healthy weight. The more you weigh, the more stress is placed on your back. For every pound of weight, 10 times that amount of pressure is placed on the back. °DIET °· Talk to your caregiver about how much calcium and vitamin D you need per day. These nutrients help to prevent weakening of the bones (osteoporosis). Osteoporosis can cause broken (fractured) bones that lead to back pain. °· Include good sources of calcium in your diet, such as dairy products, green, leafy vegetables, and products with calcium added (fortified). °· Include good sources of vitamin D in your diet, such as milk and foods that are fortified with vitamin D. °· Consider taking a nutritional supplement or a multivitamin if needed. °· Stop smoking if you smoke. °POSTURE °· Sit and stand up straight. Avoid leaning forward when you sit or hunching over when you stand. °· Choose chairs with good low back (lumbar) support. °· If you work at a desk, sit close to your work so you do not need to lean over. Keep your chin tucked in. Keep your neck drawn back and elbows bent at a right angle. Your arms should look like the letter "L." °· Sit high and close to the steering wheel when you drive. Add a lumbar support to your car  seat if needed. °· Avoid sitting or standing in one position for too long. Take breaks to get up, stretch, and walk around at least once every hour. Take breaks if you are driving for long periods of time. °· Sleep on your side with your knees slightly bent, or sleep on your back with a pillow under your knees. Do not sleep on your stomach. °LIFTING, TWISTING, AND REACHING °· Avoid heavy lifting, especially repetitive lifting. If you must do heavy lifting: °· Stretch before lifting. °· Work slowly. °· Rest between lifts. °· Use carts and dollies to move objects when possible. °· Make several small trips instead of carrying 1 heavy load. °· Ask for help when you need it. °· Ask for help when moving big, awkward objects. °· Follow these steps when lifting: °· Stand with your feet shoulder-width apart. °· Get as close to the object as you can. Do not try to pick up heavy objects that are far from your body. °· Use handles or lifting straps if they are available. °· Bend at your knees. Squat down, but keep your heels off the floor. °· Keep your shoulders pulled back, your chin tucked in, and your back straight. °· Lift the object slowly, tightening the muscles in your legs, abdomen, and buttocks. Keep the object as close to the center of your body as possible. °· When you put a load down, use these same guidelines in reverse. °· Do not: °· Lift the object above your waist. °·   Twist at the waist while lifting or carrying a load. Move your feet if you need to turn, not your waist.  Bend over without bending at your knees.  Avoid reaching over your head, across a table, or for an object on a high surface. OTHER TIPS  Avoid wet floors and keep sidewalks clear of ice to prevent falls.  Do not sleep on a mattress that is too soft or too hard.  Keep items that are used frequently within easy reach.  Put heavier objects on shelves at waist level and lighter objects on lower or higher shelves.  Find ways to  decrease your stress, such as exercise, massage, or relaxation techniques. Stress can build up in your muscles. Tense muscles are more vulnerable to injury.  Seek treatment for depression or anxiety if needed. These conditions can increase your risk of developing back pain. SEEK MEDICAL CARE IF:  You injure your back.  You have questions about diet, exercise, or other ways to prevent back injuries. MAKE SURE YOU:  Understand these instructions.  Will watch your condition.  Will get help right away if you are not doing well or get worse. Document Released: 08/24/2004 Document Revised: 10/09/2011 Document Reviewed: 08/28/2011 [employer] Patient Information 2015 [employer]. This information is not intended to replace advice given to you by your health care provider. Make sure you discuss any questions you have with your health care provider.  Back Pain, Adult Low back pain is very common. About 1 in 5 people have back pain.The cause of low back pain is rarely dangerous. The pain often gets better over time.About half of people with a sudden onset of back pain feel better in just 2 weeks. About 8 in 10 people feel better by 6 weeks.  CAUSES Some common causes of back pain include:  Strain of the muscles or ligaments supporting the spine.  Wear and tear (degeneration) of the spinal discs.  Arthritis.  Direct injury to the back. DIAGNOSIS Most of the time, the direct cause of low back pain is not known.However, back pain can be treated effectively even when the exact cause of the pain is unknown.Answering your caregiver's questions about your overall health and symptoms is one of the most accurate ways to make sure the cause of your pain is not dangerous. If your caregiver needs more information, he or she may order lab work or imaging tests (X-rays or MRIs).However, even if imaging tests show changes in your back, this usually does not require surgery. HOME CARE INSTRUCTIONS For  many people, back pain returns.Since low back pain is rarely dangerous, it is often a condition that people can learn to manageon their own.   Remain active. It is stressful on the back to sit or stand in one place. Do not sit, drive, or stand in one place for more than 30 minutes at a time. Take short walks on level surfaces as soon as pain allows.Try to increase the length of time you walk each day.  Do not stay in bed.Resting more than 1 or 2 days can delay your recovery.  Do not avoid exercise or work.Your body is made to move.It is not dangerous to be active, even though your back may hurt.Your back will likely heal faster if you return to being active before your pain is gone.  Pay attention to your body when you bend and lift. Many people have less discomfortwhen lifting if they bend their knees, keep the load close to their bodies,and   avoid twisting. Often, the most comfortable positions are those that put less stress on your recovering back.  Find a comfortable position to sleep. Use a firm mattress and lie on your side with your knees slightly bent. If you lie on your back, put a pillow under your knees.  Only take over-the-counter or prescription medicines as directed by your caregiver. Over-the-counter medicines to reduce pain and inflammation are often the most helpful.Your caregiver may prescribe muscle relaxant drugs.These medicines help dull your pain so you can more quickly return to your normal activities and healthy exercise.  Put ice on the injured area.  Put ice in a plastic bag.  Place a towel between your skin and the bag.  Leave the ice on for 15-20 minutes, 03-04 times a day for the first 2 to 3 days. After that, ice and heat may be alternated to reduce pain and spasms.  Ask your caregiver about trying back exercises and gentle massage. This may be of some benefit.  Avoid feeling anxious or stressed.Stress increases muscle tension and can worsen back  pain.It is important to recognize when you are anxious or stressed and learn ways to manage it.Exercise is a great option. SEEK MEDICAL CARE IF:  You have pain that is not relieved with rest or medicine.  You have pain that does not improve in 1 week.  You have new symptoms.  You are generally not feeling well. SEEK IMMEDIATE MEDICAL CARE IF:   You have pain that radiates from your back into your legs.  You develop new bowel or bladder control problems.  You have unusual weakness or numbness in your arms or legs.  You develop nausea or vomiting.  You develop abdominal pain.  You feel faint. Document Released: 07/17/2005 Document Revised: 01/16/2012 Document Reviewed: 11/18/2013 [facility] Patient Information 2015 [place], [place]. This information is not intended to replace advice given to you by your health care provider. Make sure you discuss any questions you have with your health care provider.  Please take your medications as prescribed. It is important for you to follow-up with Dr. wall for further evaluation and management of your blood pressure. Return to ED for new or worsening symptoms.

## 2014-11-09 NOTE — ED Notes (Signed)
Care manager at bedside.

## 2014-11-18 ENCOUNTER — Other Ambulatory Visit: Payer: Self-pay

## 2014-11-18 ENCOUNTER — Encounter: Payer: Self-pay | Admitting: Family Medicine

## 2014-11-18 ENCOUNTER — Ambulatory Visit: Payer: Medicaid Other | Admitting: Cardiology

## 2014-11-18 ENCOUNTER — Ambulatory Visit: Payer: Self-pay | Attending: Family Medicine | Admitting: Family Medicine

## 2014-11-18 VITALS — BP 142/92 | HR 91 | Temp 98.1°F | Resp 18 | Ht 69.0 in | Wt 242.0 lb

## 2014-11-18 DIAGNOSIS — E7800 Pure hypercholesterolemia, unspecified: Secondary | ICD-10-CM

## 2014-11-18 DIAGNOSIS — Z9114 Patient's other noncompliance with medication regimen: Secondary | ICD-10-CM | POA: Insufficient documentation

## 2014-11-18 DIAGNOSIS — I10 Essential (primary) hypertension: Secondary | ICD-10-CM

## 2014-11-18 DIAGNOSIS — Z9861 Coronary angioplasty status: Secondary | ICD-10-CM | POA: Insufficient documentation

## 2014-11-18 DIAGNOSIS — F17200 Nicotine dependence, unspecified, uncomplicated: Secondary | ICD-10-CM

## 2014-11-18 DIAGNOSIS — Z9049 Acquired absence of other specified parts of digestive tract: Secondary | ICD-10-CM | POA: Insufficient documentation

## 2014-11-18 DIAGNOSIS — I2510 Atherosclerotic heart disease of native coronary artery without angina pectoris: Secondary | ICD-10-CM | POA: Insufficient documentation

## 2014-11-18 DIAGNOSIS — Z791 Long term (current) use of non-steroidal anti-inflammatories (NSAID): Secondary | ICD-10-CM | POA: Insufficient documentation

## 2014-11-18 DIAGNOSIS — Z955 Presence of coronary angioplasty implant and graft: Secondary | ICD-10-CM

## 2014-11-18 DIAGNOSIS — E118 Type 2 diabetes mellitus with unspecified complications: Secondary | ICD-10-CM

## 2014-11-18 DIAGNOSIS — F17210 Nicotine dependence, cigarettes, uncomplicated: Secondary | ICD-10-CM | POA: Insufficient documentation

## 2014-11-18 DIAGNOSIS — E1165 Type 2 diabetes mellitus with hyperglycemia: Secondary | ICD-10-CM | POA: Insufficient documentation

## 2014-11-18 DIAGNOSIS — E785 Hyperlipidemia, unspecified: Secondary | ICD-10-CM | POA: Insufficient documentation

## 2014-11-18 DIAGNOSIS — Z720 Tobacco use: Secondary | ICD-10-CM

## 2014-11-18 LAB — LIPID PANEL
Cholesterol: 209 mg/dL — ABNORMAL HIGH (ref 0–200)
HDL: 50 mg/dL (ref >=46)
LDL CALC: 131 mg/dL — AB (ref 0–99)
Total CHOL/HDL Ratio: 4.2 Ratio
Triglycerides: 140 mg/dL (ref <150)
VLDL: 28 mg/dL (ref 0–40)

## 2014-11-18 LAB — BASIC METABOLIC PANEL
BUN: 14 mg/dL (ref 6–23)
CHLORIDE: 100 meq/L (ref 96–112)
CO2: 25 mEq/L (ref 19–32)
Calcium: 10 mg/dL (ref 8.4–10.5)
Creat: 0.78 mg/dL (ref 0.50–1.10)
GLUCOSE: 251 mg/dL — AB (ref 70–99)
POTASSIUM: 4.2 meq/L (ref 3.5–5.3)
Sodium: 137 mEq/L (ref 135–145)

## 2014-11-18 LAB — GLUCOSE, POCT (MANUAL RESULT ENTRY)
POC GLUCOSE: 373 mg/dL — AB (ref 70–99)
POC Glucose: 247 mg/dl — AB (ref 70–99)

## 2014-11-18 LAB — POCT GLYCOSYLATED HEMOGLOBIN (HGB A1C): Hemoglobin A1C: 10.4

## 2014-11-18 MED ORDER — ASPIRIN EC 81 MG PO TBEC: 81.0000 mg | DELAYED_RELEASE_TABLET | Freq: Every day | ORAL | Status: DC

## 2014-11-18 MED ORDER — GLIPIZIDE 10 MG PO TABS: 10.0000 mg | ORAL_TABLET | Freq: Two times a day (BID) | ORAL | Status: DC

## 2014-11-18 MED ORDER — METOPROLOL TARTRATE 25 MG PO TABS: 25.0000 mg | ORAL_TABLET | Freq: Two times a day (BID) | ORAL | Status: DC

## 2014-11-18 MED ORDER — ATORVASTATIN CALCIUM 10 MG PO TABS: 10.0000 mg | ORAL_TABLET | Freq: Every day | ORAL | Status: DC

## 2014-11-18 MED ORDER — PRASUGREL HCL 10 MG PO TABS: 10.0000 mg | ORAL_TABLET | Freq: Every day | ORAL | Status: DC

## 2014-11-18 MED ORDER — INSULIN ASPART 100 UNIT/ML ~~LOC~~ SOLN
20.0000 [IU] | Freq: Once | SUBCUTANEOUS | Status: AC
  Administered 2014-11-18: 20 [IU] via SUBCUTANEOUS

## 2014-11-18 MED ORDER — LISINOPRIL 10 MG PO TABS: 10.0000 mg | ORAL_TABLET | Freq: Every day | ORAL | Status: DC

## 2014-11-18 NOTE — Progress Notes (Signed)
Subjective:    Patient ID: Amy Chaney, female    DOB: [birth year], 44 y.o.   MRN: [id number]  HPI Roni Friberg is a 44 year old female with a history of CAD s/p DES stent placement to the RCA, Hypertension, type 2 DM, Depression who has not been to see her PCP - Dr Emilee Hero Bonsu in the last 2 years ever since she ran out of Medicaid and has been out of all her medications for this time frame.  She had a recent ED visit to [facility] where she had presented with pain in her tailbone and was found to have an elevated blood pressure of 190/115 which reduced spontaneously to 179/84 without intervention; she was discharged on hydrochlorothiazide and advised to establish care here.  She admits to shortness of breath which is at her baseline, also has orthopnea but denies chest pains or pedal edema. She was previously being seen by cardiologist where she had insurance will hasn't been to see one in a long time and has also been off her Effient and other cardiac medications. Has not been checking her blood sugars either.  Past Medical History  Diagnosis Date  . Hypertension   . Coronary artery disease   . High cholesterol   . Heart murmur   . Chest pain at rest     "and w/exertion; last couple months" (02/25/2013)  . Shortness of breath     "at rest and w/exertion last couple months" (02/25/2013)  . Type II diabetes mellitus 12/2010  . Daily headache   . Depression     Past Surgical History  Procedure Laterality Date  . Coronary angioplasty with stent placement  02/25/2013    "2" (02/25/2013)  . Cholecystectomy  ~ 2000  . Tubal ligation  1994  . Left heart catheterization with coronary angiogram N/A 02/25/2013    Procedure: LEFT HEART CATHETERIZATION WITH CORONARY ANGIOGRAM;  Surgeon: Laverda Page, MD;  Location: [facility] CATH LAB;  Service: Cardiovascular;  Laterality: N/A;  . Left heart catheterization with coronary angiogram N/A 04/01/2013    Procedure: LEFT HEART CATHETERIZATION WITH  CORONARY ANGIOGRAM;  Surgeon: Laverda Page, MD;  Location: [facility] CATH LAB;  Service: Cardiovascular;  Laterality: N/A;  . Percutaneous coronary stent intervention (pci-s)  04/01/2013    Procedure: PERCUTANEOUS CORONARY STENT INTERVENTION (PCI-S);  Surgeon: Laverda Page, MD;  Location: [facility] CATH LAB;  Service: Cardiovascular;;    History   Social History  . Marital Status: Single    Spouse Name: N/A  . Number of Children: N/A  . Years of Education: N/A   Occupational History  . Not on file.   Social History Main Topics  . Smoking status: Current Every Day Smoker -- 0.50 packs/day for 15 years    Types: Cigarettes  . Smokeless tobacco: Never Used  . Alcohol Use: Yes     Comment: 02/25/2013 "only on special occasions; maybe once/yr"  . Drug Use: Yes    Special: Marijuana     Comment: 02/25/2013 "quit marijuana ~ 2001"  . Sexual Activity: Not Currently    Birth Control/ Protection: Surgical   Other Topics Concern  . Not on file   Social History Narrative    No Known Allergies  Current Outpatient Prescriptions on File Prior to Visit  Medication Sig Dispense Refill  . ferrous sulfate 325 (65 FE) MG tablet Take 325 mg by mouth daily with breakfast.    . hydrALAZINE (APRESOLINE) 25 MG tablet Take 1 tablet (25  mg total) by mouth 4 (four) times daily. 120 tablet 1  . naproxen (NAPROSYN) 500 MG tablet Take 1 tablet (500 mg total) by mouth 2 (two) times daily. 30 tablet 0  . nitroGLYCERIN (NITROSTAT) 0.4 MG SL tablet Place 0.4 mg under the tongue every 5 (five) minutes as needed for chest pain.    . Vilazodone HCl (VIIBRYD) 40 MG TABS Take 40 mg by mouth See admin instructions. 14 day course to start 02/24/13 (once daily)     No current facility-administered medications on file prior to visit.     Review of Systems  Constitutional: Negative for activity change, appetite change and fatigue.  HENT: Negative for congestion, sinus pressure and sore throat.   Eyes: Negative for  visual disturbance.  Respiratory: Negative for cough, chest tightness, shortness of breath and wheezing.   Cardiovascular: Negative for chest pain and palpitations.  Gastrointestinal: Negative for abdominal pain, constipation and abdominal distention.  Endocrine: Negative for polydipsia.  Genitourinary: Negative for dysuria and frequency.  Musculoskeletal: Negative for back pain and arthralgias.  Skin: Negative for rash.  Neurological: Negative for tremors, light-headedness and numbness.  Hematological: Does not bruise/bleed easily.  Psychiatric/Behavioral: Negative for behavioral problems and agitation.         Objective: Filed Vitals:   11/18/14 1447  BP: 142/92  Pulse: 91  Temp: 98.1 F (36.7 C)  Resp: 18      Physical Exam  Constitutional: She is oriented to person, place, and time. She appears well-developed and well-nourished. No distress.  HENT:  Head: Normocephalic.  Right Ear: External ear normal.  Left Ear: External ear normal.  Nose: Nose normal.  Mouth/Throat: Oropharynx is clear and moist.  Eyes: Conjunctivae and EOM are normal. Pupils are equal, round, and reactive to light.  Neck: Normal range of motion. No JVD present.  Cardiovascular: Normal rate, regular rhythm and intact distal pulses.  Exam reveals no gallop.   Murmur heard.  Systolic murmur is present with a grade of 3/6  Pulmonary/Chest: Effort normal and breath sounds normal. No respiratory distress. She has no wheezes. She has no rales. She exhibits no tenderness.  Abdominal: Soft. Bowel sounds are normal. She exhibits no distension and no mass. There is no tenderness.  Musculoskeletal: She exhibits no edema or tenderness.  Tenderness in sacro coccygeal region  Neurological: She is alert and oriented to person, place, and time. She has normal reflexes.  Skin: Skin is warm and dry. She is not diaphoretic.  Psychiatric: She has a normal mood and affect.     Lab Results  Component Value Date    HGBA1C 10.4 11/18/2014        Assessment & Plan:  44 year old female with multiple co-morbidities who has been non compliant with medications secondary to lack of Health coverage here to establish care.  CAD s/p angioplasty and stent placement: Educated the patient on the need to remain on Effient. I am referring her for a 2-D echo given her symptoms which is suggestive of cardiac decompensation. EKG today and I will place on Dr. wall schedule for optimization of management.  Essential Hypertension: BP is slightly above goal of <140/90 She has a ton of antihypertensives on her list which could make her hypotensive. I will discontinue HCTZ,, Exforge, reduce the dose of Lisinopril from 20 to 10mg  and switch atenolol to Metoprolol. I will leave Hydralazine on her list even though I am not refilling it, in the event that Cardiology feels she needs it. BP  will be reassessed at her next office visit.  Type 2 diabetes mellitus:  Uncontrolled with an A1c of 10.4, CBG of 373 in the clinic today. NovoLog 20 units given, patient will be observed in the clinic and CBG repeated. I am sending off fasting labs and will make regimen changes accordingly  Hyperlipidemia: Refilled Lipitor  Tobacco use disorder: Smoking cessation support: smoking cessation hotline: 1-800-QUIT-NOW.  Smoking cessation classes are available through [facility]. Call [phone] or visit our website at [email or web address]  Spent 76minutes counseling on smoking cessations and patient is not ready to quit.  Educated on teratogenicity of some her medications and the need for a form of birth control

## 2014-11-18 NOTE — Patient Instructions (Signed)
Diabetes Mellitus and Food It is important for you to manage your blood sugar (glucose) level. Your blood glucose level can be greatly affected by what you eat. Eating healthier foods in the appropriate amounts throughout the day at about the same time each day will help you control your blood glucose level. It can also help slow or prevent worsening of your diabetes mellitus. Healthy eating may even help you improve the level of your blood pressure and reach or maintain a healthy weight.  HOW CAN FOOD AFFECT ME? Carbohydrates Carbohydrates affect your blood glucose level more than any other type of food. Your dietitian will help you determine how many carbohydrates to eat at each meal and teach you how to count carbohydrates. Counting carbohydrates is important to keep your blood glucose at a healthy level, especially if you are using insulin or taking certain medicines for diabetes mellitus. Alcohol Alcohol can cause sudden decreases in blood glucose (hypoglycemia), especially if you use insulin or take certain medicines for diabetes mellitus. Hypoglycemia can be a life-threatening condition. Symptoms of hypoglycemia (sleepiness, dizziness, and disorientation) are similar to symptoms of having too much alcohol.  If your health care provider has given you approval to drink alcohol, do so in moderation and use the following guidelines:  Women should not have more than one drink per day, and men should not have more than two drinks per day. One drink is equal to:  12 oz of beer.  5 oz of wine.  1 oz of hard liquor.  Do not drink on an empty stomach.  Keep yourself hydrated. Have water, diet soda, or unsweetened iced tea.  Regular soda, juice, and other mixers might contain a lot of carbohydrates and should be counted. WHAT FOODS ARE NOT RECOMMENDED? As you make food choices, it is important to remember that all foods are not the same. Some foods have fewer nutrients per serving than other  foods, even though they might have the same number of calories or carbohydrates. It is difficult to get your body what it needs when you eat foods with fewer nutrients. Examples of foods that you should avoid that are high in calories and carbohydrates but low in nutrients include:  Trans fats (most processed foods list trans fats on the Nutrition Facts label).  Regular soda.  Juice.  Candy.  Sweets, such as cake, pie, doughnuts, and cookies.  Fried foods. WHAT FOODS CAN I EAT? Have nutrient-rich foods, which will nourish your body and keep you healthy. The food you should eat also will depend on several factors, including:  The calories you need.  The medicines you take.  Your weight.  Your blood glucose level.  Your blood pressure level.  Your cholesterol level. You also should eat a variety of foods, including:  Protein, such as meat, poultry, fish, tofu, nuts, and seeds (lean animal proteins are best).  Fruits.  Vegetables.  Dairy products, such as milk, cheese, and yogurt (low fat is best).  Breads, grains, pasta, cereal, rice, and beans.  Fats such as olive oil, trans fat-free margarine, canola oil, avocado, and olives. DOES EVERYONE WITH DIABETES MELLITUS HAVE THE SAME MEAL PLAN? Because every person with diabetes mellitus is different, there is not one meal plan that works for everyone. It is very important that you meet with a dietitian who will help you create a meal plan that is just right for you. Document Released: 04/13/2005 Document Revised: 07/22/2013 Document Reviewed: 06/13/2013 [employer] Patient Information 2015 [employer]. This   information is not intended to replace advice given to you by your health care provider. Make sure you discuss any questions you have with your health care provider. Hypertension Hypertension, commonly called high blood pressure, is when the force of blood pumping through your arteries is too strong. Your arteries are the  blood vessels that carry blood from your heart throughout your body. A blood pressure reading consists of a higher number over a lower number, such as 110/72. The higher number (systolic) is the pressure inside your arteries when your heart pumps. The lower number (diastolic) is the pressure inside your arteries when your heart relaxes. Ideally you want your blood pressure below 120/80. Hypertension forces your heart to work harder to pump blood. Your arteries may become narrow or stiff. Having hypertension puts you at risk for heart disease, stroke, and other problems.  RISK FACTORS Some risk factors for high blood pressure are controllable. Others are not.  Risk factors you cannot control include:   Race. You may be at higher risk if you are African American.  Age. Risk increases with age.  Gender. Men are at higher risk than women before age 46 years. After age 72, women are at higher risk than men. Risk factors you can control include:  Not getting enough exercise or physical activity.  Being overweight.  Getting too much fat, sugar, calories, or salt in your diet.  Drinking too much alcohol. SIGNS AND SYMPTOMS Hypertension does not usually cause signs or symptoms. Extremely high blood pressure (hypertensive crisis) may cause headache, anxiety, shortness of breath, and nosebleed. DIAGNOSIS  To check if you have hypertension, your health care provider will measure your blood pressure while you are seated, with your arm held at the level of your heart. It should be measured at least twice using the same arm. Certain conditions can cause a difference in blood pressure between your right and left arms. A blood pressure reading that is higher than normal on one occasion does not mean that you need treatment. If one blood pressure reading is high, ask your health care provider about having it checked again. TREATMENT  Treating high blood pressure includes making lifestyle changes and possibly  taking medicine. Living a healthy lifestyle can help lower high blood pressure. You may need to change some of your habits. Lifestyle changes may include:  Following the DASH diet. This diet is high in fruits, vegetables, and whole grains. It is low in salt, red meat, and added sugars.  Getting at least 2 hours of brisk physical activity every week.  Losing weight if necessary.  Not smoking.  Limiting alcoholic beverages.  Learning ways to reduce stress. If lifestyle changes are not enough to get your blood pressure under control, your health care provider may prescribe medicine. You may need to take more than one. Work closely with your health care provider to understand the risks and benefits. HOME CARE INSTRUCTIONS  Have your blood pressure rechecked as directed by your health care provider.   Take medicines only as directed by your health care provider. Follow the directions carefully. Blood pressure medicines must be taken as prescribed. The medicine does not work as well when you skip doses. Skipping doses also puts you at risk for problems.   Do not smoke.   Monitor your blood pressure at home as directed by your health care provider. SEEK MEDICAL CARE IF:   You think you are having a reaction to medicines taken.  You have recurrent headaches or  feel dizzy.  You have swelling in your ankles.  You have trouble with your vision. SEEK IMMEDIATE MEDICAL CARE IF:  You develop a severe headache or confusion.  You have unusual weakness, numbness, or feel faint.  You have severe chest or abdominal pain.  You vomit repeatedly.  You have trouble breathing. MAKE SURE YOU:   Understand these instructions.  Will watch your condition.  Will get help right away if you are not doing well or get worse. Document Released: 07/17/2005 Document Revised: 12/01/2013 Document Reviewed: 05/09/2013 [facility] Patient Information 2015 [place], [place]. This information is not  intended to replace advice given to you by your health care provider. Make sure you discuss any questions you have with your health care provider.

## 2014-11-18 NOTE — Progress Notes (Signed)
Quick Note:  Labs addressed at office as well as medications and patient was made aware. ______ 

## 2014-11-18 NOTE — Progress Notes (Signed)
Patient went to ED 2 weeks ago for lower back pain. Fell at work 05/2014. Patient reports current lower back pain as level 9, described as pressure. Patient has heart stent, ran out of medicine for that.

## 2014-11-23 ENCOUNTER — Ambulatory Visit (HOSPITAL_COMMUNITY): Payer: Self-pay | Attending: Cardiology | Admitting: Cardiology

## 2014-11-23 ENCOUNTER — Telehealth: Payer: Self-pay

## 2014-11-23 DIAGNOSIS — Z955 Presence of coronary angioplasty implant and graft: Secondary | ICD-10-CM | POA: Insufficient documentation

## 2014-11-23 DIAGNOSIS — I2510 Atherosclerotic heart disease of native coronary artery without angina pectoris: Secondary | ICD-10-CM | POA: Insufficient documentation

## 2014-11-23 NOTE — Telephone Encounter (Signed)
Patient has financial appt on 12/14/14 at 4pm with [facility].

## 2014-11-23 NOTE — Progress Notes (Signed)
Echo performed. 

## 2014-11-23 NOTE — Telephone Encounter (Signed)
-----   Message from Arnoldo Morale, MD sent at 11/18/2014  5:04 PM EDT ----- Regarding: 2D ECHO Please schedule 2d echo once she has her orange card. Thank you.

## 2014-11-24 NOTE — Telephone Encounter (Signed)
Pt returning Dr's call, please f/u with pt.

## 2014-11-24 NOTE — Telephone Encounter (Signed)
Pt returning Dr's/nurse's, please f/u with pt.

## 2014-11-25 NOTE — Telephone Encounter (Signed)
Patient called returning phone call, please f/u with patient

## 2014-11-25 NOTE — Telephone Encounter (Signed)
Patient has already had 2D echo. Patient aware of message from Dr. Jarold Song about results. Patient agrees to keep appointment with Dr. Verl Blalock on Dec 09, 2014.

## 2014-11-25 NOTE — Telephone Encounter (Signed)
-----   Message from Amado Coe, [place] sent at 11/24/2014 10:04 AM EDT ----- Left message for patient to call office regarding her results.

## 2014-11-25 NOTE — Telephone Encounter (Signed)
-----   Message from Arnoldo Morale, MD sent at 11/18/2014  5:04 PM EDT ----- Regarding: 2D ECHO Please schedule 2d echo once she has her orange card. Thank you.

## 2014-11-25 NOTE — Telephone Encounter (Signed)
Nurse called patient, patient verified date of birth. Nurse reported Dr. Johna Sheriff message "Please inform her that her 2d echo showed a form of heart failure; she is scheduled to see Cardiology in house and is advised to keep that appointment where her results will be discussed in detail and if she is still dyspneic I would like to know so I could place her on Lasix. Thanks". Patient reports having difficulty breathing when she walks a lot. Patient understands the importance of keeping appointment with Dr. Verl Blalock. Nurse informed patient of appointment with Dr. Verl Blalock on Dec 09, 2014 at 9:45am. Patient explains she will come to that appointment. Nurse explained to patient, Dr. Jarold Song may decide to give patient prescription for Lasix. Nurse will report difficulty breathing with walking to Dr. Jarold Song. If Dr. Jarold Song would like patient placed on Lasix, nurse will return call to patient. If Dr. Jarold Song does not want to prescribe Lasix at this time, Nurse will not call patient back. Patient voices understanding.

## 2014-11-25 NOTE — Telephone Encounter (Signed)
Patient is returning phone call from nurse, she would like to get a call back after 4 pm, please f/u

## 2014-11-25 NOTE — Telephone Encounter (Signed)
-----   Message from Amado Coe, [place] sent at 11/24/2014 10:04 AM EDT ----- Left message for patient to call office regarding her results.

## 2014-11-27 ENCOUNTER — Encounter: Payer: Self-pay | Admitting: Family Medicine

## 2014-11-27 ENCOUNTER — Other Ambulatory Visit: Payer: Self-pay | Admitting: Family Medicine

## 2014-11-27 MED ORDER — FUROSEMIDE 40 MG PO TABS: 40.0000 mg | ORAL_TABLET | Freq: Every day | ORAL | Status: DC

## 2014-11-27 NOTE — Progress Notes (Unsigned)
Please inform her that I have sent a script for Lasix to her pharmacy. Thanks.

## 2014-11-30 ENCOUNTER — Telehealth: Payer: Self-pay

## 2014-11-30 ENCOUNTER — Encounter (HOSPITAL_COMMUNITY): Payer: Self-pay | Admitting: Emergency Medicine

## 2014-11-30 ENCOUNTER — Emergency Department (HOSPITAL_COMMUNITY)
Admission: EM | Admit: 2014-11-30 | Discharge: 2014-12-01 | Disposition: A | Discharge status: Home / Self Care | Payer: Self-pay | Attending: Emergency Medicine | Admitting: Emergency Medicine

## 2014-11-30 DIAGNOSIS — Z9889 Other specified postprocedural states: Secondary | ICD-10-CM | POA: Insufficient documentation

## 2014-11-30 DIAGNOSIS — R011 Cardiac murmur, unspecified: Secondary | ICD-10-CM | POA: Insufficient documentation

## 2014-11-30 DIAGNOSIS — I2510 Atherosclerotic heart disease of native coronary artery without angina pectoris: Secondary | ICD-10-CM | POA: Insufficient documentation

## 2014-11-30 DIAGNOSIS — Z7982 Long term (current) use of aspirin: Secondary | ICD-10-CM | POA: Insufficient documentation

## 2014-11-30 DIAGNOSIS — E780 Pure hypercholesterolemia: Secondary | ICD-10-CM | POA: Insufficient documentation

## 2014-11-30 DIAGNOSIS — Z720 Tobacco use: Secondary | ICD-10-CM | POA: Insufficient documentation

## 2014-11-30 DIAGNOSIS — R079 Chest pain, unspecified: Secondary | ICD-10-CM | POA: Insufficient documentation

## 2014-11-30 DIAGNOSIS — R112 Nausea with vomiting, unspecified: Secondary | ICD-10-CM | POA: Insufficient documentation

## 2014-11-30 DIAGNOSIS — I10 Essential (primary) hypertension: Secondary | ICD-10-CM | POA: Insufficient documentation

## 2014-11-30 DIAGNOSIS — G43809 Other migraine, not intractable, without status migrainosus: Secondary | ICD-10-CM | POA: Insufficient documentation

## 2014-11-30 DIAGNOSIS — Z7902 Long term (current) use of antithrombotics/antiplatelets: Secondary | ICD-10-CM | POA: Insufficient documentation

## 2014-11-30 DIAGNOSIS — Z79899 Other long term (current) drug therapy: Secondary | ICD-10-CM | POA: Insufficient documentation

## 2014-11-30 DIAGNOSIS — E119 Type 2 diabetes mellitus without complications: Secondary | ICD-10-CM | POA: Insufficient documentation

## 2014-11-30 DIAGNOSIS — Z9861 Coronary angioplasty status: Secondary | ICD-10-CM | POA: Insufficient documentation

## 2014-11-30 DIAGNOSIS — Z8659 Personal history of other mental and behavioral disorders: Secondary | ICD-10-CM | POA: Insufficient documentation

## 2014-11-30 LAB — BASIC METABOLIC PANEL
Anion gap: 12 (ref 5–15)
BUN: 5 mg/dL — AB (ref 6–20)
CHLORIDE: 102 mmol/L (ref 101–111)
CO2: 21 mmol/L — AB (ref 22–32)
Calcium: 9.3 mg/dL (ref 8.9–10.3)
Creatinine, Ser: 0.6 mg/dL (ref 0.44–1.00)
GFR calc Af Amer: >60 mL/min (ref >60)
Glucose, Bld: 293 mg/dL — ABNORMAL HIGH (ref 70–99)
Potassium: 3.9 mmol/L (ref 3.5–5.1)
Sodium: 135 mmol/L (ref 135–145)

## 2014-11-30 LAB — I-STAT TROPONIN, ED: Troponin i, poc: 0 ng/mL (ref 0.00–0.08)

## 2014-11-30 LAB — CBC
HEMATOCRIT: 38.8 % (ref 36.0–46.0)
HEMOGLOBIN: 12.7 g/dL (ref 12.0–15.0)
MCH: 26.1 pg (ref 26.0–34.0)
MCHC: 32.7 g/dL (ref 30.0–36.0)
MCV: 79.8 fL (ref 78.0–100.0)
Platelets: 364 10*3/uL (ref 150–400)
RBC: 4.86 MIL/uL (ref 3.87–5.11)
RDW: 14.1 % (ref 11.5–15.5)
WBC: 11.8 10*3/uL — ABNORMAL HIGH (ref 4.0–10.5)

## 2014-11-30 NOTE — ED Notes (Addendum)
Pt reports migraine since this morning with hx of same; pt reports photophobia and n/v as well; pt reports attempting to take rx for migraines but vomited it back up; pt reports pain in head radiates down neck and into chest

## 2014-11-30 NOTE — Telephone Encounter (Signed)
Nurse called patient, patient aware Dr. Jarold Song is starting patient on Lasix. Lasix is at pharmacy. Patient reports she will pick up prescription today and start taking medication. Patient asked nurse to explain results to patients sister. Patient placed phone on speaker phone for sister to hear. Nurse explained 2D echo shows heart failure and patient is being placed on lasix for her shortness of breath. Nurse also explained that Dr. Verl Blalock, with cardiology, will be able to explain heart failure in further detail and to keep appointment with Dr. Verl Blalock. Patient's sister asked when is appointment with Dr. Verl Blalock. Nurse informed patient and sister appointment with Dr. Verl Blalock is 12/09/14 at 9:45am. Patient and sister voiced understanding.

## 2014-12-01 MED ORDER — DEXAMETHASONE SODIUM PHOSPHATE 10 MG/ML IJ SOLN
10.0000 mg | Freq: Once | INTRAMUSCULAR | Status: AC
  Administered 2014-12-01: 10 mg via INTRAMUSCULAR
  Filled 2014-12-01: qty 1

## 2014-12-01 MED ORDER — DIPHENHYDRAMINE HCL 25 MG PO CAPS
25.0000 mg | ORAL_CAPSULE | Freq: Once | ORAL | Status: AC
  Administered 2014-12-01: 25 mg via ORAL
  Filled 2014-12-01: qty 1

## 2014-12-01 MED ORDER — METOCLOPRAMIDE HCL 5 MG/ML IJ SOLN
10.0000 mg | Freq: Once | INTRAMUSCULAR | Status: AC
  Administered 2014-12-01: 10 mg via INTRAMUSCULAR
  Filled 2014-12-01: qty 2

## 2014-12-01 NOTE — ED Notes (Signed)
Pt stable, ambulatory, denies any pain, states understanding of discharge instructions 

## 2014-12-01 NOTE — Discharge Instructions (Signed)

## 2014-12-01 NOTE — ED Provider Notes (Signed)
CSN: [id number]     Arrival date & time 11/30/14  2025 History   First MD Initiated Contact with Patient 11/30/14 2353     Chief Complaint  Patient presents with  . Migraine  . Chest Pain     (Consider location/radiation/quality/duration/timing/severity/associated sxs/prior Treatment) Patient is a 44 y.o. female presenting with migraines. The history is provided by the patient. No language interpreter was used.  Migraine This is a new problem. The current episode started today. Associated symptoms include chest pain, headaches, nausea and vomiting. Pertinent negatives include no abdominal pain, chills, congestion, coughing or fever. Associated symptoms comments: She woke with a headache this morning similar to previous headaches referred to by the patient as migraine. She has had some nausea with one episode of vomiting. No fever. She states she had a fleeting burning sensation in her chest without SOB. She has a history of heart disease but states the pain in her chest earlier was dissimilar to previous chest pain. .    Past Medical History  Diagnosis Date  . Hypertension   . Coronary artery disease   . High cholesterol   . Heart murmur   . Chest pain at rest     "and w/exertion; last couple months" (02/25/2013)  . Shortness of breath     "at rest and w/exertion last couple months" (02/25/2013)  . Type II diabetes mellitus 12/2010  . Daily headache   . Depression    Past Surgical History  Procedure Laterality Date  . Coronary angioplasty with stent placement  02/25/2013    "2" (02/25/2013)  . Cholecystectomy  ~ 2000  . Tubal ligation  1994  . Left heart catheterization with coronary angiogram N/A 02/25/2013    Procedure: LEFT HEART CATHETERIZATION WITH CORONARY ANGIOGRAM;  Surgeon: Laverda Page, MD;  Location: [facility] CATH LAB;  Service: Cardiovascular;  Laterality: N/A;  . Left heart catheterization with coronary angiogram N/A 04/01/2013    Procedure: LEFT HEART CATHETERIZATION WITH  CORONARY ANGIOGRAM;  Surgeon: Laverda Page, MD;  Location: [facility] CATH LAB;  Service: Cardiovascular;  Laterality: N/A;  . Percutaneous coronary stent intervention (pci-s)  04/01/2013    Procedure: PERCUTANEOUS CORONARY STENT INTERVENTION (PCI-S);  Surgeon: Laverda Page, MD;  Location: [facility] CATH LAB;  Service: Cardiovascular;;   Family History  Problem Relation Age of Onset  . Diabetes Mother   . Diabetes Sister   . Diabetes Son     type 1  . Diabetes Sister   . Diabetes Son     type 2   History  Substance Use Topics  . Smoking status: Current Every Day Smoker -- 0.50 packs/day for 15 years    Types: Cigarettes  . Smokeless tobacco: Never Used  . Alcohol Use: Yes     Comment: 02/25/2013 "only on special occasions; maybe once/yr"   OB History    Gravida Para Term Preterm AB TAB SAB Ectopic Multiple Living   3 3 3  0 0 0 0 0 0 3     Review of Systems  Constitutional: Negative for fever and chills.  HENT: Negative for congestion.   Eyes: Positive for photophobia.  Respiratory: Negative.  Negative for cough.   Cardiovascular: Positive for chest pain.  Gastrointestinal: Positive for nausea and vomiting. Negative for abdominal pain.  Musculoskeletal: Negative.   Skin: Negative.   Neurological: Positive for headaches.      Allergies  Review of patient's allergies indicates no known allergies.  Home Medications   Prior to Admission medications  Medication Sig Start Date End Date Taking? Authorizing Provider  aspirin EC 81 MG tablet Take 1 tablet (81 mg total) by mouth daily. 11/18/14  Yes Arnoldo Morale, MD  atorvastatin (LIPITOR) 10 MG tablet Take 1 tablet (10 mg total) by mouth daily. 11/18/14  Yes Arnoldo Morale, MD  ferrous sulfate 325 (65 FE) MG tablet Take 325 mg by mouth daily with breakfast.   Yes Historical Provider, MD  furosemide (LASIX) 40 MG tablet Take 1 tablet (40 mg total) by mouth daily. 11/27/14  Yes Arnoldo Morale, MD  glipiZIDE (GLUCOTROL) 10 MG tablet Take 1  tablet (10 mg total) by mouth 2 (two) times daily before a meal. 11/18/14  Yes Arnoldo Morale, MD  lisinopril (PRINIVIL,ZESTRIL) 10 MG tablet Take 1 tablet (10 mg total) by mouth daily. Discontinue HCTZ 11/18/14  Yes Arnoldo Morale, MD  metoprolol tartrate (LOPRESSOR) 25 MG tablet Take 1 tablet (25 mg total) by mouth 2 (two) times daily. 11/18/14  Yes Arnoldo Morale, MD  naproxen (NAPROSYN) 500 MG tablet Take 1 tablet (500 mg total) by mouth 2 (two) times daily. Patient taking differently: Take 500 mg by mouth daily as needed (headache).  11/09/14  Yes Benjamin Cartner, PA-C  nitroGLYCERIN (NITROSTAT) 0.4 MG SL tablet Place 0.4 mg under the tongue every 5 (five) minutes as needed for chest pain.   Yes Historical Provider, MD  prasugrel (EFFIENT) 10 MG TABS tablet Take 1 tablet (10 mg total) by mouth daily. 11/18/14  Yes Arnoldo Morale, MD  hydrALAZINE (APRESOLINE) 25 MG tablet Take 1 tablet (25 mg total) by mouth 4 (four) times daily. Patient not taking: Reported on 11/30/2014 02/26/13   Adrian Prows, MD   BP 182/90 mmHg  Pulse 68  Temp(Src) 98.4 F (36.9 C) (Oral)  Resp 16  Ht 5\' 9"  (1.753 m)  Wt 240 lb (108.863 kg)  BMI 35.43 kg/m2  SpO2 100%  LMP 11/26/2014 Physical Exam  Constitutional: She is oriented to person, place, and time. She appears well-developed and well-nourished.  HENT:  Head: Normocephalic.  Eyes: Pupils are equal, round, and reactive to light.  Neck: Normal range of motion. Neck supple.  Cardiovascular: Normal rate and regular rhythm.   Pulmonary/Chest: Effort normal and breath sounds normal.  Abdominal: Soft. Bowel sounds are normal. There is no tenderness. There is no rebound and no guarding.  Musculoskeletal: Normal range of motion.  Neurological: She is alert and oriented to person, place, and time. She has normal strength and normal reflexes. No sensory deficit. She displays a negative Romberg sign. Coordination normal.  CN's 3-12 grossly intact. Speech clear and focused.  Ambulatory without ataxia.   Skin: Skin is warm and dry. No rash noted.  Psychiatric: She has a normal mood and affect.    ED Course  Procedures (including critical care time) Labs Review Labs Reviewed  CBC - Abnormal; Notable for the following:    WBC 11.8 (*)    All other components within normal limits  BASIC METABOLIC PANEL - Abnormal; Notable for the following:    CO2 21 (*)    Glucose, Bld 293 (*)    BUN 5 (*)    All other components within normal limits  I-STAT TROPOININ, ED    Imaging Review No results found.   EKG Interpretation   Date/Time:  Monday Nov 30 2014 20:40:54 EDT Ventricular Rate:  75 PR Interval:  146 QRS Duration: 90 QT Interval:  424 QTC Calculation: 473 R Axis:   55 Text Interpretation:  Sinus rhythm  with marked sinus arrhythmia RSR' or QR  pattern in V1 suggests right ventricular conduction delay Borderline ECG  artifact noted Confirmed by Christy Gentles  MD, DONALD ([id number]) on 11/30/2014  11:54:35 PM      MDM   Final diagnoses:  None    1. Migraine headache  Pain is resolved with headache cocktail (Decadron, Benadryl, Reglan).   Usual symptoms of migraine prompting ED presentation. She did have a burning, fleeting chest pain she reports resolved after a few seconds without recurrence and were different than pain experienced with CAD in the past. Normal (non-acute) EKG, negative troponin - doubt ACS. Stable for discharge home.    Charlann Lange, PA-C 12/01/14 [id number]  Ripley Fraise, MD 12/02/14 [phone]

## 2014-12-09 ENCOUNTER — Encounter: Payer: Self-pay | Admitting: Cardiology

## 2014-12-09 ENCOUNTER — Ambulatory Visit: Payer: Self-pay | Attending: Cardiology | Admitting: Cardiology

## 2014-12-09 VITALS — BP 169/92 | HR 72 | Temp 98.4°F | Resp 16 | Ht 69.0 in | Wt 248.0 lb

## 2014-12-09 DIAGNOSIS — I2510 Atherosclerotic heart disease of native coronary artery without angina pectoris: Secondary | ICD-10-CM | POA: Insufficient documentation

## 2014-12-09 DIAGNOSIS — I25119 Atherosclerotic heart disease of native coronary artery with unspecified angina pectoris: Secondary | ICD-10-CM | POA: Insufficient documentation

## 2014-12-09 DIAGNOSIS — E119 Type 2 diabetes mellitus without complications: Secondary | ICD-10-CM | POA: Insufficient documentation

## 2014-12-09 DIAGNOSIS — I5032 Chronic diastolic (congestive) heart failure: Secondary | ICD-10-CM | POA: Insufficient documentation

## 2014-12-09 DIAGNOSIS — Z9119 Patient's noncompliance with other medical treatment and regimen: Secondary | ICD-10-CM | POA: Insufficient documentation

## 2014-12-09 DIAGNOSIS — I5022 Chronic systolic (congestive) heart failure: Secondary | ICD-10-CM | POA: Insufficient documentation

## 2014-12-09 DIAGNOSIS — I5042 Chronic combined systolic (congestive) and diastolic (congestive) heart failure: Secondary | ICD-10-CM | POA: Insufficient documentation

## 2014-12-09 DIAGNOSIS — Z91199 Patient's noncompliance with other medical treatment and regimen due to unspecified reason: Secondary | ICD-10-CM | POA: Insufficient documentation

## 2014-12-09 DIAGNOSIS — Z720 Tobacco use: Secondary | ICD-10-CM | POA: Insufficient documentation

## 2014-12-09 LAB — GLUCOSE, POCT (MANUAL RESULT ENTRY): POC GLUCOSE: 311 mg/dL — AB (ref 70–99)

## 2014-12-09 MED ORDER — OMEPRAZOLE 40 MG PO CPDR: 40.0000 mg | DELAYED_RELEASE_CAPSULE | Freq: Every day | ORAL | Status: DC

## 2014-12-09 NOTE — Assessment & Plan Note (Signed)
Reviewed the importance of compliance to avoid progression of left ventricular systolic dysfunction not to mention risk of progressive coronary disease, risk of stroke, and kidney failure and other diabetic complications.

## 2014-12-09 NOTE — Progress Notes (Signed)
Pt comes in for further evaluation with Dr. Verl Blalock, new diagnosis Heart Failure 4/25 Pt is noncompliant with taking prescribed medications and checking blood sugars Stopped taking Lasix because she was told Naprosyn would cause side effects BP elevated @ 169/92 72 C/o SOB with lying on left side  States she has cut back on 6-8 cigarettes per day Checking CBG-

## 2014-12-09 NOTE — Progress Notes (Signed)
HPI Amy Chaney is a 44 year old African-American female referred by Dr Roderic Ovens for the evaluation of congestive heart failure. This is based on echocardiogram done in April of this year which showed ejection fraction 45-50%, no left ventricular enlargement, mild mitral regurgitation, left atrial enlargement.  Her cardiac risk factors include obesity, non-insulin-dependent diabetes mellitus, hypertension, and hyperlipidemia. She also smokes.  She has been extremely noncompliant with her medications. She ate at [facility] this morning and did not take any of her meds. She also continues to smoke about 6 cigarettes a day.  She does have a history of coronary disease and has had several percutaneous interventions of her right coronary artery. The last intervention was in 2014. At that time, she was smoking, and was not taking her aspirin or Effient.  She currently denies any angina. She does have some symptoms that sound like reflux.  Lipids reviewed her LDL was not at goal. She may or may not have been taking her statin at the time.  Past Medical History  Diagnosis Date  . Hypertension   . Coronary artery disease   . High cholesterol   . Heart murmur   . Chest pain at rest     "and w/exertion; last couple months" (02/25/2013)  . Shortness of breath     "at rest and w/exertion last couple months" (02/25/2013)  . Type II diabetes mellitus 12/2010  . Daily headache   . Depression     Current Outpatient Prescriptions  Medication Sig Dispense Refill  . aspirin EC 81 MG tablet Take 1 tablet (81 mg total) by mouth daily. 30 tablet 2  . atorvastatin (LIPITOR) 10 MG tablet Take 1 tablet (10 mg total) by mouth daily. 30 tablet 2  . ferrous sulfate 325 (65 FE) MG tablet Take 325 mg by mouth daily with breakfast.    . glipiZIDE (GLUCOTROL) 10 MG tablet Take 1 tablet (10 mg total) by mouth 2 (two) times daily before a meal. 60 tablet 2  . lisinopril (PRINIVIL,ZESTRIL) 10 MG tablet Take 1 tablet (10 mg  total) by mouth daily. Discontinue HCTZ 30 tablet 2  . metoprolol tartrate (LOPRESSOR) 25 MG tablet Take 1 tablet (25 mg total) by mouth 2 (two) times daily. 30 tablet 2  . naproxen (NAPROSYN) 500 MG tablet Take 1 tablet (500 mg total) by mouth 2 (two) times daily. (Patient taking differently: Take 500 mg by mouth daily as needed (headache). ) 30 tablet 0  . prasugrel (EFFIENT) 10 MG TABS tablet Take 1 tablet (10 mg total) by mouth daily. 30 tablet 0  . furosemide (LASIX) 40 MG tablet Take 1 tablet (40 mg total) by mouth daily. (Patient not taking: Reported on 12/09/2014) 30 tablet 3  . nitroGLYCERIN (NITROSTAT) 0.4 MG SL tablet Place 0.4 mg under the tongue every 5 (five) minutes as needed for chest pain.     No current facility-administered medications for this visit.    No Known Allergies  Family History  Problem Relation Age of Onset  . Diabetes Mother   . Diabetes Sister   . Diabetes Son     type 1  . Diabetes Sister   . Diabetes Son     type 2    History   Social History  . Marital Status: Single    Spouse Name: N/A  . Number of Children: N/A  . Years of Education: N/A   Occupational History  . Not on file.   Social History Main Topics  . Smoking status: Current  Every Day Smoker -- 0.50 packs/day for 15 years    Types: Cigarettes  . Smokeless tobacco: Never Used     Comment: states she has cut back to 56 cigarettes/day   . Alcohol Use: 0.0 oz/week    0 Standard drinks or equivalent per week     Comment: 02/25/2013 "only on special occasions; maybe once/yr"  . Drug Use: Yes    Special: Marijuana     Comment: 02/25/2013 "quit marijuana ~ 2001"  . Sexual Activity: Not Currently    Birth Control/ Protection: Surgical   Other Topics Concern  . Not on file   Social History Narrative    ROS ALL NEGATIVE EXCEPT THOSE NOTED IN HPI  PE  General Appearance: well developed, well nourished in no acute distress, obese HEENT: symmetrical face, PERRLA, good dentition   Neck: no JVD, thyromegaly, or adenopathy, trachea midline Chest: symmetric without deformity Cardiac: RRR, normal S1, S2, no gallop, soft systolic murmur left upper sternal border  Lung: clear to ausculation  Vascular: all pulses full without bruits  Extremities: no cyanosis, clubbing or edema, no sign of DVT, no varicosities  Skin: normal color, no rashes Neuro: alert and oriented x 3, non-focal Pysch: normal affect  EKG  BMET    Component Value Date/Time   NA 135 11/30/2014 2045   K 3.9 11/30/2014 2045   CL 102 11/30/2014 2045   CO2 21* 11/30/2014 2045   GLUCOSE 293* 11/30/2014 2045   BUN 5* 11/30/2014 2045   CREATININE 0.60 11/30/2014 2045   CREATININE 0.78 11/18/2014 1559   CALCIUM 9.3 11/30/2014 2045   GFRNONAA >60 11/30/2014 2045   GFRAA >60 11/30/2014 2045    Lipid Panel     Component Value Date/Time   CHOL 209* 11/18/2014 1559   TRIG 140 11/18/2014 1559   HDL 50 11/18/2014 1559   CHOLHDL 4.2 11/18/2014 1559   VLDL 28 11/18/2014 1559   LDLCALC 131* 11/18/2014 1559    CBC    Component Value Date/Time   WBC 11.8* 11/30/2014 2045   RBC 4.86 11/30/2014 2045   HGB 12.7 11/30/2014 2045   HCT 38.8 11/30/2014 2045   PLT 364 11/30/2014 2045   MCV 79.8 11/30/2014 2045   MCH 26.1 11/30/2014 2045   MCHC 32.7 11/30/2014 2045   RDW 14.1 11/30/2014 2045   LYMPHSABS 3.9 11/12/2007 2004   MONOABS 0.8 11/12/2007 2004   EOSABS 0.5 11/12/2007 2004   BASOSABS 0.1 11/12/2007 2004

## 2014-12-09 NOTE — Assessment & Plan Note (Signed)
Continue with her current medications including dual antiplatelet therapy particularly in light of the fact she still smokes. She's also had reocclusion and several interventions of her right coronary artery. Continue secondary preventative therapy including achieving an LDL less than 100.

## 2014-12-09 NOTE — Assessment & Plan Note (Signed)
She has mild reduction in left ventricular systolic function but no cardiomegaly or left ventricular chamber enlargement. There is also mild mitral regurgitation and left atrial enlargement.  I've encouraged her and reinforced the importance of taking her medications every day, reviewed them with her, and also the importance of stopping smoking not to mention controlling her diabetes and weight. She seemed to have a good understanding of this and all questions were answered. I feel like that if she is compliant that she will not have symptomatic heart failure for a number of years.

## 2014-12-09 NOTE — Patient Instructions (Addendum)
Thank you for coming in to see Dr. Verl Blalock It was a pleasure meeting you!  Take your prescribed medication everyday and monitor fluid intake Check blood sugar 2-3 times per week  And return in 3 months for fasting blood work,A1c Stop Smoking!DASH Eating Plan DASH stands for "Dietary Approaches to Stop Hypertension." The DASH eating plan is a healthy eating plan that has been shown to reduce high blood pressure (hypertension). Additional health benefits may include reducing the risk of type 2 diabetes mellitus, heart disease, and stroke. The DASH eating plan may also help with weight loss. WHAT DO I NEED TO KNOW ABOUT THE DASH EATING PLAN? For the DASH eating plan, you will follow these general guidelines:  Choose foods with a percent daily value for sodium of less than 5% (as listed on the food label).  Use salt-free seasonings or herbs instead of table salt or sea salt.  Check with your health care provider or pharmacist before using salt substitutes.  Eat lower-sodium products, often labeled as "lower sodium" or "no salt added."  Eat fresh foods.  Eat more vegetables, fruits, and low-fat dairy products.  Choose whole grains. Look for the word "whole" as the first word in the ingredient list.  Choose fish and skinless chicken or [place] more often than red meat. Limit fish, poultry, and meat to 6 oz (170 g) each day.  Limit sweets, desserts, sugars, and sugary drinks.  Choose heart-healthy fats.  Limit cheese to 1 oz (28 g) per day.  Eat more home-cooked food and less restaurant, buffet, and fast food.  Limit fried foods.  Cook foods using methods other than frying.  Limit canned vegetables. If you do use them, rinse them well to decrease the sodium.  When eating at a restaurant, ask that your food be prepared with less salt, or no salt if possible. WHAT FOODS CAN I EAT? Seek help from a dietitian for individual calorie needs. Grains Whole grain or whole wheat bread. Brown  rice. Whole grain or whole wheat pasta. Quinoa, bulgur, and whole grain cereals. Low-sodium cereals. Corn or whole wheat flour tortillas. Whole grain cornbread. Whole grain crackers. Low-sodium crackers. Vegetables Fresh or frozen vegetables (raw, steamed, roasted, or grilled). Low-sodium or reduced-sodium tomato and vegetable juices. Low-sodium or reduced-sodium tomato sauce and paste. Low-sodium or reduced-sodium canned vegetables.  Fruits All fresh, canned (in natural juice), or frozen fruits. Meat and Other Protein Products Ground beef (85% or leaner), grass-fed beef, or beef trimmed of fat. Skinless chicken or [place]. Ground chicken or [place]. Pork trimmed of fat. All fish and seafood. Eggs. Dried beans, peas, or lentils. Unsalted nuts and seeds. Unsalted canned beans. Dairy Low-fat dairy products, such as skim or 1% milk, 2% or reduced-fat cheeses, low-fat ricotta or cottage cheese, or plain low-fat yogurt. Low-sodium or reduced-sodium cheeses. Fats and Oils Tub margarines without trans fats. Light or reduced-fat mayonnaise and salad dressings (reduced sodium). Avocado. Safflower, olive, or canola oils. Natural peanut or almond butter. Other Unsalted popcorn and pretzels. The items listed above may not be a complete list of recommended foods or beverages. Contact your dietitian for more options. WHAT FOODS ARE NOT RECOMMENDED? Grains White bread. White pasta. White rice. Refined cornbread. Bagels and croissants. Crackers that contain trans fat. Vegetables Creamed or fried vegetables. Vegetables in a cheese sauce. Regular canned vegetables. Regular canned tomato sauce and paste. Regular tomato and vegetable juices. Fruits Dried fruits. Canned fruit in light or heavy syrup. Fruit juice. Meat and Other Protein Products  Fatty cuts of meat. Ribs, chicken wings, bacon, sausage, bologna, salami, chitterlings, fatback, hot dogs, bratwurst, and packaged luncheon meats. Salted nuts and seeds.  Canned beans with salt. Dairy Whole or 2% milk, cream, half-and-half, and cream cheese. Whole-fat or sweetened yogurt. Full-fat cheeses or blue cheese. Nondairy creamers and whipped toppings. Processed cheese, cheese spreads, or cheese curds. Condiments Onion and garlic salt, seasoned salt, table salt, and sea salt. Canned and packaged gravies. Worcestershire sauce. Tartar sauce. Barbecue sauce. Teriyaki sauce. Soy sauce, including reduced sodium. Steak sauce. Fish sauce. Oyster sauce. Cocktail sauce. Horseradish. Ketchup and mustard. Meat flavorings and tenderizers. Bouillon cubes. Hot sauce. Tabasco sauce. Marinades. Taco seasonings. Relishes. Fats and Oils Butter, stick margarine, lard, shortening, ghee, and bacon fat. Coconut, palm kernel, or palm oils. Regular salad dressings. Other Pickles and olives. Salted popcorn and pretzels. The items listed above may not be a complete list of foods and beverages to avoid. Contact your dietitian for more information. WHERE CAN I FIND MORE INFORMATION? National Heart, Lung, and Blood Institute: travelstabloid.com Document Released: 07/06/2011 Document Revised: 12/01/2013 Document Reviewed: 05/21/2013 [facility] Patient Information 2015 [place], [place]. This information is not intended to replace advice given to you by your health care provider. Make sure you discuss any questions you have with your health care provider. Smoking Cessation Quitting smoking is important to your health and has many advantages. However, it is not always easy to quit since nicotine is a very addictive drug. Oftentimes, people try 3 times or more before being able to quit. This document explains the best ways for you to prepare to quit smoking. Quitting takes hard work and a lot of effort, but you can do it. ADVANTAGES OF QUITTING SMOKING  You will live longer, feel better, and live better.  Your body will feel the impact of quitting smoking  almost immediately.  Within 20 minutes, blood pressure decreases. Your pulse returns to its normal level.  After 8 hours, carbon monoxide levels in the blood return to normal. Your oxygen level increases.  After 24 hours, the chance of having a heart attack starts to decrease. Your breath, hair, and body stop smelling like smoke.  After 48 hours, damaged nerve endings begin to recover. Your sense of taste and smell improve.  After 72 hours, the body is virtually free of nicotine. Your bronchial tubes relax and breathing becomes easier.  After 2 to 12 weeks, lungs can hold more air. Exercise becomes easier and circulation improves.  The risk of having a heart attack, stroke, cancer, or lung disease is greatly reduced.  After 1 year, the risk of coronary heart disease is cut in half.  After 5 years, the risk of stroke falls to the same as a nonsmoker.  After 10 years, the risk of lung cancer is cut in half and the risk of other cancers decreases significantly.  After 15 years, the risk of coronary heart disease drops, usually to the level of a nonsmoker.  If you are pregnant, quitting smoking will improve your chances of having a healthy baby.  The people you live with, especially any children, will be healthier.  You will have extra money to spend on things other than cigarettes. QUESTIONS TO THINK ABOUT BEFORE ATTEMPTING TO QUIT You may want to talk about your answers with your health care provider.  Why do you want to quit?  If you tried to quit in the past, what helped and what did not?  What will be the most difficult situations  for you after you quit? How will you plan to handle them?  Who can help you through the tough times? Your family? Friends? A health care provider?  What pleasures do you get from smoking? What ways can you still get pleasure if you quit? Here are some questions to ask your health care provider:  How can you help me to be successful at  quitting?  What medicine do you think would be best for me and how should I take it?  What should I do if I need more help?  What is smoking withdrawal like? How can I get information on withdrawal? GET READY  Set a quit date.  Change your environment by getting rid of all cigarettes, ashtrays, matches, and lighters in your home, car, or work. Do not let people smoke in your home.  Review your past attempts to quit. Think about what worked and what did not. GET SUPPORT AND ENCOURAGEMENT You have a better chance of being successful if you have help. You can get support in many ways.  Tell your family, friends, and coworkers that you are going to quit and need their support. Ask them not to smoke around you.  Get individual, group, or telephone counseling and support. Programs are available at [employer] and health centers. Call your local health department for information about programs in your area.  Spiritual beliefs and practices may help some smokers quit.  Download a "quit meter" on your computer to keep track of quit statistics, such as how long you have gone without smoking, cigarettes not smoked, and money saved.  Get a self-help book about quitting smoking and staying off tobacco. [place] yourself from urges to smoke. Talk to someone, go for a walk, or occupy your time with a task.  Change your normal routine. Take a different route to work. Drink tea instead of coffee. Eat breakfast in a different place.  Reduce your stress. Take a hot bath, exercise, or read a book.  Plan something enjoyable to do every day. Reward yourself for not smoking.  Explore interactive web-based programs that specialize in helping you quit. GET MEDICINE AND USE IT CORRECTLY Medicines can help you stop smoking and decrease the urge to smoke. Combining medicine with the above behavioral methods and support can greatly increase your chances of successfully  quitting smoking.  Nicotine replacement therapy helps deliver nicotine to your body without the negative effects and risks of smoking. Nicotine replacement therapy includes nicotine gum, lozenges, inhalers, nasal sprays, and skin patches. Some may be available over-the-counter and others require a prescription.  Antidepressant medicine helps people abstain from smoking, but how this works is unknown. This medicine is available by prescription.  Nicotinic receptor partial agonist medicine simulates the effect of nicotine in your brain. This medicine is available by prescription. Ask your health care provider for advice about which medicines to use and how to use them based on your health history. Your health care provider will tell you what side effects to look out for if you choose to be on a medicine or therapy. Carefully read the information on the package. Do not use any other product containing nicotine while using a nicotine replacement product.  RELAPSE OR DIFFICULT SITUATIONS Most relapses occur within the first 3 months after quitting. Do not be discouraged if you start smoking again. Remember, most people try several times before finally quitting. You may have symptoms of withdrawal because your body is used  to nicotine. You may crave cigarettes, be irritable, feel very hungry, cough often, get headaches, or have difficulty concentrating. The withdrawal symptoms are only temporary. They are strongest when you first quit, but they will go away within 10-14 days. To reduce the chances of relapse, try to:  Avoid drinking alcohol. Drinking lowers your chances of successfully quitting.  Reduce the amount of caffeine you consume. Once you quit smoking, the amount of caffeine in your body increases and can give you symptoms, such as a rapid heartbeat, sweating, and anxiety.  Avoid smokers because they can make you want to smoke.  Do not let weight gain distract you. Many smokers will gain weight  when they quit, usually less than 10 pounds. Eat a healthy diet and stay active. You can always lose the weight gained after you quit.  Find ways to improve your mood other than smoking. FOR MORE INFORMATION  www.smokefree.gov  Document Released: 07/11/2001 Document Revised: 12/01/2013 Document Reviewed: 10/26/2011 [facility] Patient Information 2015 [place], [place]. This information is not intended to replace advice given to you by your health care provider. Make sure you discuss any questions you have with your health care provider.

## 2014-12-09 NOTE — Assessment & Plan Note (Signed)
Poorly controlled secondary to noncompliance and weight. I stressed this today aiming for hemoglobin A1c of less than 8%. We'll recheck this with fasting lipids in 3 months.

## 2014-12-14 ENCOUNTER — Ambulatory Visit: Payer: Self-pay | Attending: Internal Medicine

## 2015-02-17 ENCOUNTER — Ambulatory Visit: Payer: Self-pay | Attending: Family Medicine

## 2015-02-17 DIAGNOSIS — E119 Type 2 diabetes mellitus without complications: Secondary | ICD-10-CM

## 2015-02-17 DIAGNOSIS — E118 Type 2 diabetes mellitus with unspecified complications: Secondary | ICD-10-CM

## 2015-02-18 ENCOUNTER — Telehealth: Payer: Self-pay | Admitting: *Deleted

## 2015-02-18 LAB — HEMOGLOBIN A1C
Hgb A1c MFr Bld: 10.1 % — ABNORMAL HIGH (ref <5.7)
MEAN PLASMA GLUCOSE: 243 mg/dL — AB (ref <117)

## 2015-02-18 LAB — LIPID PANEL
CHOLESTEROL: 163 mg/dL (ref 0–200)
HDL: 43 mg/dL — ABNORMAL LOW (ref >=46)
LDL CALC: 104 mg/dL — AB (ref 0–99)
TRIGLYCERIDES: 81 mg/dL (ref <150)
Total CHOL/HDL Ratio: 3.8 Ratio
VLDL: 16 mg/dL (ref 0–40)

## 2015-02-18 LAB — COMPREHENSIVE METABOLIC PANEL
ALBUMIN: 3.7 g/dL (ref 3.5–5.2)
ALT: 8 U/L (ref 0–35)
AST: 9 U/L (ref 0–37)
Alkaline Phosphatase: 50 U/L (ref 39–117)
BUN: 12 mg/dL (ref 6–23)
CO2: 25 mEq/L (ref 19–32)
Calcium: 9.4 mg/dL (ref 8.4–10.5)
Chloride: 104 mEq/L (ref 96–112)
Creat: 0.59 mg/dL (ref 0.50–1.10)
Glucose, Bld: 260 mg/dL — ABNORMAL HIGH (ref 70–99)
Potassium: 4.4 mEq/L (ref 3.5–5.3)
SODIUM: 137 meq/L (ref 135–145)
Total Bilirubin: 0.5 mg/dL (ref 0.2–1.2)
Total Protein: 7.2 g/dL (ref 6.0–8.3)

## 2015-02-18 LAB — MICROALBUMIN / CREATININE URINE RATIO
CREATININE, URINE: 111.9 mg/dL
MICROALB UR: 1.4 mg/dL (ref <2.0)
Microalb Creat Ratio: 12.5 mg/g (ref 0.0–30.0)

## 2015-02-18 NOTE — Telephone Encounter (Signed)
Spoke to patient and verified name and date of birth and gave lab results.   Patient was supposed to come back in May to establish with a PCP.  Patient did not do that so I transferred her to [facility] to make an appointment.

## 2015-02-18 NOTE — Telephone Encounter (Signed)
-----   Message from Arnoldo Morale, MD sent at 02/18/2015  8:48 AM EDT ----- Please inform the patient that urine is negative for microalbuminuria. Thank you.

## 2015-02-19 ENCOUNTER — Telehealth: Payer: Self-pay

## 2015-02-19 NOTE — Telephone Encounter (Signed)
Patient returned phone call from nurse, please f/u with pt. °

## 2015-02-19 NOTE — Telephone Encounter (Signed)
-----   Message from Renella Cunas, MD sent at 02/18/2015 10:28 AM EDT ----- Lipids look good. Needs to continue to tighten blood sugar control. Reinforce taking meds and diet.

## 2015-02-19 NOTE — Telephone Encounter (Signed)
Nurse called patient, patient verified date of birth. Patient aware of lipids look good and agrees to limit sugary foods and carbs. Nurse explained to limit breads, pastas, rice, fried foods which will help with diabetes and cholesterol. Patient agrees to take all medications as directed and modify diet.  Patient voices understanding and has no further questions at this time.

## 2015-02-19 NOTE — Telephone Encounter (Signed)
Nurse called patient, reached voicemail at first number. Left message for patient to call Cam Dauphin with [facility], at [phone]. Nurse called second number, person answering telephone took message for patient to call Azure Barrales at [phone].

## 2015-03-03 ENCOUNTER — Ambulatory Visit: Payer: Self-pay | Attending: Family Medicine | Admitting: Family Medicine

## 2015-03-03 ENCOUNTER — Encounter: Payer: Self-pay | Admitting: Family Medicine

## 2015-03-03 VITALS — BP 137/85 | HR 58 | Temp 98.4°F | Resp 16 | Ht 69.0 in | Wt 240.0 lb

## 2015-03-03 DIAGNOSIS — Z720 Tobacco use: Secondary | ICD-10-CM

## 2015-03-03 DIAGNOSIS — I10 Essential (primary) hypertension: Secondary | ICD-10-CM

## 2015-03-03 DIAGNOSIS — F17200 Nicotine dependence, unspecified, uncomplicated: Secondary | ICD-10-CM | POA: Insufficient documentation

## 2015-03-03 DIAGNOSIS — E118 Type 2 diabetes mellitus with unspecified complications: Secondary | ICD-10-CM

## 2015-03-03 LAB — POCT URINALYSIS DIPSTICK
Bilirubin, UA: NEGATIVE
Glucose, UA: NEGATIVE
Ketones, UA: NEGATIVE
NITRITE UA: NEGATIVE
PH UA: 6
Protein, UA: NEGATIVE
RBC UA: NEGATIVE
SPEC GRAV UA: 1.02
UROBILINOGEN UA: 1

## 2015-03-03 LAB — GLUCOSE, POCT (MANUAL RESULT ENTRY)
POC GLUCOSE: 252 mg/dL — AB (ref 70–99)
POC Glucose: 232 mg/dl — AB (ref 70–99)

## 2015-03-03 MED ORDER — INSULIN ASPART 100 UNIT/ML ~~LOC~~ SOLN
5.0000 [IU] | Freq: Once | SUBCUTANEOUS | Status: AC
  Administered 2015-03-03: 5 [IU] via SUBCUTANEOUS

## 2015-03-03 MED ORDER — TRUEPLUS LANCETS 28G MISC: 1.0000 | Freq: Three times a day (TID) | Status: DC

## 2015-03-03 MED ORDER — BUPROPION HCL ER (XL) 150 MG PO TB24: 300.0000 mg | ORAL_TABLET | Freq: Every day | ORAL | Status: DC

## 2015-03-03 MED ORDER — METFORMIN HCL 1000 MG PO TABS: 1000.0000 mg | ORAL_TABLET | Freq: Two times a day (BID) | ORAL | Status: DC

## 2015-03-03 MED ORDER — INSULIN ASPART 100 UNIT/ML ~~LOC~~ SOLN: 10.0000 [IU] | Freq: Once | SUBCUTANEOUS | Status: DC

## 2015-03-03 MED ORDER — GLUCOSE BLOOD VI STRP: 1.0000 | ORAL_STRIP | Freq: Three times a day (TID) | Status: DC

## 2015-03-03 MED ORDER — TRUE METRIX METER W/DEVICE KIT: 1.0000 | PACK | Status: DC | PRN

## 2015-03-03 NOTE — Progress Notes (Signed)
F/U HTN DM Elevated glucose Took medication at 8:30. Fasting

## 2015-03-03 NOTE — Patient Instructions (Addendum)
Ms. Niccoli,  Thank you for coming in today.  1. Diabetes: A1c is 10, goal is < 7  To get to goal do the following  1. Lower intake of carbohydrates: breads, pasta, sugar, grains  2. Increase intake of healthy fats and proteins  3. Take medications to help lower blood sugar: Glipizide  Adding Metformin 1000 mg twice daily  Diabetes blood sugar goals  Fasting (in AM before breakfast, 8 hrs of no eating or drinking (except water or unsweetened coffee or tea): 90-110 2 hrs after meals: < 160,   No low sugars: nothing < 70   2. HTN: BP is at goal  Continue current regimen   3. Smoking with situational depression: Smoking cessation support: smoking cessation hotline: 1-800-QUIT-NOW.  Smoking cessation classes are available through [facility] and [facility]. Call [phone] or visit our website at [email or web address] Start wellbutrin: 150 mg daily for 3 days, then 300 mg daily. Treatment should continue for 7 to 12 weeks.   F/u in 4 weeks for blood sugar and smoking follow up on wellbutrin   Dr. Adrian Blackwater

## 2015-03-03 NOTE — Progress Notes (Signed)
   Subjective:    Patient ID: Amy Chaney, female    DOB: 1970-12-07, 44 y.o.   MRN: [id number] CC: diabetes and HTN follow up  HPI 44 yo F presents for f/u vist:  1. CHRONIC DIABETES  Disease Monitoring  Blood Sugar Ranges: not checking   Polyuria: no   Visual problems: no   Medication Compliance: yes  Medication Side Effects  Hypoglycemia: no   2. CHRONIC HYPERTENSION  Disease Monitoring  Blood pressure range: not checking   Chest pain: no   Dyspnea: no   Claudication: no   Medication compliance: yes  Medication Side Effects  Lightheadedness: no   Urinary frequency: no     3. Smoker: patient is smoking. Patient is depressed. She is depressed with her current living situation. She desires to quit smoking.   History  Substance Use Topics  . Smoking status: Current Every Day Smoker -- 0.50 packs/day for 15 years    Types: Cigarettes  . Smokeless tobacco: Never Used     Comment: states she has cut back to 56 cigarettes/day   . Alcohol Use: 0.0 oz/week    0 Standard drinks or equivalent per week     Comment: 02/25/2013 "only on special occasions; maybe once/yr"    Review of Systems  Constitutional: Negative for fever and chills.  Respiratory: Positive for shortness of breath.   Cardiovascular: Positive for chest pain.  Gastrointestinal: Negative for abdominal pain and blood in stool.  Skin: Negative for rash.  Psychiatric/Behavioral: Negative for suicidal ideas and dysphoric mood.  GAD-7: score of 15. 0-1, 2-4,5,6. 3-2,3,7.     Objective:   Physical Exam BP 137/85 mmHg  Pulse 58  Temp(Src) 98.4 F (36.9 C) (Oral)  Resp 16  Ht 5\' 9"  (1.753 m)  Wt 240 lb (108.863 kg)  BMI 35.43 kg/m2  SpO2 100%  LMP 02/16/2015 General appearance: alert, cooperative and no distress  Throat: normal oropharynx  Lungs: clear to auscultation bilaterally Heart: regular rate and rhythm, S1, S2 normal, no murmur, click, rub or gallop Extremities: extremities normal,  atraumatic, no cyanosis or edema  Pulses: 2 + b/l UE and LE Skin: no lesions   Lab Results  Component Value Date   HGBA1C 10.1* 02/17/2015   CBG 252       Assessment & Plan:

## 2015-03-04 NOTE — Assessment & Plan Note (Signed)
Smoking with situational depression: Smoking cessation support: smoking cessation hotline: 1-800-QUIT-NOW.  Smoking cessation classes are available through [facility] and [facility]. Call [phone] or visit our website at [email or web address] Start wellbutrin: 150 mg daily for 3 days, then 300 mg daily. Treatment should continue for 7 to 12 weeks.

## 2015-03-04 NOTE — Assessment & Plan Note (Signed)
Diabetes: A1c is 10, goal is < 7  To get to goal do the following  1. Lower intake of carbohydrates: breads, pasta, sugar, grains  2. Increase intake of healthy fats and proteins  3. Take medications to help lower blood sugar: Glipizide  Adding Metformin 1000 mg twice daily  Diabetes blood sugar goals  Fasting (in AM before breakfast, 8 hrs of no eating or drinking (except water or unsweetened coffee or tea): 90-110 2 hrs after meals: < 160,   No low sugars: nothing < 70

## 2015-03-04 NOTE — Assessment & Plan Note (Addendum)
HTN: BP is at goal  Continue current regimen

## 2015-03-10 ENCOUNTER — Ambulatory Visit: Payer: Self-pay | Admitting: Family Medicine

## 2015-11-05 ENCOUNTER — Emergency Department (HOSPITAL_COMMUNITY)
Admission: EM | Admit: 2015-11-05 | Discharge: 2015-11-05 | Disposition: A | Discharge status: Home / Self Care | Payer: BLUE CROSS/BLUE SHIELD | Attending: Emergency Medicine | Admitting: Emergency Medicine

## 2015-11-05 ENCOUNTER — Encounter (HOSPITAL_COMMUNITY): Payer: Self-pay | Admitting: Family Medicine

## 2015-11-05 ENCOUNTER — Emergency Department (HOSPITAL_COMMUNITY): Payer: BLUE CROSS/BLUE SHIELD

## 2015-11-05 DIAGNOSIS — F329 Major depressive disorder, single episode, unspecified: Secondary | ICD-10-CM | POA: Diagnosis not present

## 2015-11-05 DIAGNOSIS — E119 Type 2 diabetes mellitus without complications: Secondary | ICD-10-CM | POA: Insufficient documentation

## 2015-11-05 DIAGNOSIS — Z79899 Other long term (current) drug therapy: Secondary | ICD-10-CM | POA: Diagnosis not present

## 2015-11-05 DIAGNOSIS — R519 Headache, unspecified: Secondary | ICD-10-CM

## 2015-11-05 DIAGNOSIS — I2510 Atherosclerotic heart disease of native coronary artery without angina pectoris: Secondary | ICD-10-CM | POA: Diagnosis not present

## 2015-11-05 DIAGNOSIS — R51 Headache: Secondary | ICD-10-CM | POA: Insufficient documentation

## 2015-11-05 DIAGNOSIS — Z7982 Long term (current) use of aspirin: Secondary | ICD-10-CM | POA: Insufficient documentation

## 2015-11-05 DIAGNOSIS — F17210 Nicotine dependence, cigarettes, uncomplicated: Secondary | ICD-10-CM | POA: Diagnosis not present

## 2015-11-05 DIAGNOSIS — I10 Essential (primary) hypertension: Secondary | ICD-10-CM | POA: Insufficient documentation

## 2015-11-05 DIAGNOSIS — E7800 Pure hypercholesterolemia, unspecified: Secondary | ICD-10-CM | POA: Insufficient documentation

## 2015-11-05 DIAGNOSIS — R011 Cardiac murmur, unspecified: Secondary | ICD-10-CM | POA: Diagnosis not present

## 2015-11-05 DIAGNOSIS — Z7984 Long term (current) use of oral hypoglycemic drugs: Secondary | ICD-10-CM | POA: Insufficient documentation

## 2015-11-05 LAB — BASIC METABOLIC PANEL
Anion gap: 8 (ref 5–15)
BUN: 8 mg/dL (ref 6–20)
CO2: 25 mmol/L (ref 22–32)
Calcium: 8.9 mg/dL (ref 8.9–10.3)
Chloride: 104 mmol/L (ref 101–111)
Creatinine, Ser: 0.56 mg/dL (ref 0.44–1.00)
GFR calc Af Amer: >60 mL/min (ref >60)
GFR calc non Af Amer: >60 mL/min (ref >60)
Glucose, Bld: 251 mg/dL — ABNORMAL HIGH (ref 65–99)
Potassium: 3.8 mmol/L (ref 3.5–5.1)
Sodium: 137 mmol/L (ref 135–145)

## 2015-11-05 LAB — CBC WITH DIFFERENTIAL/PLATELET
Basophils Absolute: 0.1 10*3/uL (ref 0.0–0.1)
Basophils Relative: 1 %
Eosinophils Absolute: 0.3 10*3/uL (ref 0.0–0.7)
Eosinophils Relative: 3 %
HCT: 37.3 % (ref 36.0–46.0)
Hemoglobin: 11.9 g/dL — ABNORMAL LOW (ref 12.0–15.0)
Lymphocytes Relative: 35 %
Lymphs Abs: 3.8 10*3/uL (ref 0.7–4.0)
MCH: 25.8 pg — ABNORMAL LOW (ref 26.0–34.0)
MCHC: 31.9 g/dL (ref 30.0–36.0)
MCV: 80.7 fL (ref 78.0–100.0)
Monocytes Absolute: 0.3 10*3/uL (ref 0.1–1.0)
Monocytes Relative: 3 %
Neutro Abs: 6.4 10*3/uL (ref 1.7–7.7)
Neutrophils Relative %: 58 %
Platelets: 313 10*3/uL (ref 150–400)
RBC: 4.62 MIL/uL (ref 3.87–5.11)
RDW: 14.1 % (ref 11.5–15.5)
WBC: 10.9 10*3/uL — ABNORMAL HIGH (ref 4.0–10.5)

## 2015-11-05 LAB — TROPONIN I: Troponin I: <0.03 ng/mL (ref <0.031)

## 2015-11-05 MED ORDER — PROCHLORPERAZINE EDISYLATE 5 MG/ML IJ SOLN
10.0000 mg | Freq: Once | INTRAMUSCULAR | Status: AC
  Administered 2015-11-05: 10 mg via INTRAVENOUS
  Filled 2015-11-05: qty 2

## 2015-11-05 MED ORDER — METOPROLOL TARTRATE 25 MG PO TABS
25.0000 mg | ORAL_TABLET | Freq: Once | ORAL | Status: AC
  Administered 2015-11-05: 25 mg via ORAL
  Filled 2015-11-05: qty 1

## 2015-11-05 MED ORDER — SODIUM CHLORIDE 0.9 % IV SOLN
INTRAVENOUS | Status: DC
  Administered 2015-11-05: 11:00:00 via INTRAVENOUS

## 2015-11-05 MED ORDER — KETOROLAC TROMETHAMINE 15 MG/ML IJ SOLN
15.0000 mg | Freq: Once | INTRAMUSCULAR | Status: AC
  Administered 2015-11-05: 15 mg via INTRAVENOUS
  Filled 2015-11-05: qty 1

## 2015-11-05 MED ORDER — LISINOPRIL 10 MG PO TABS
10.0000 mg | ORAL_TABLET | Freq: Once | ORAL | Status: AC
  Administered 2015-11-05: 10 mg via ORAL
  Filled 2015-11-05: qty 1

## 2015-11-05 MED ORDER — DIPHENHYDRAMINE HCL 50 MG/ML IJ SOLN
12.5000 mg | Freq: Once | INTRAMUSCULAR | Status: AC
  Administered 2015-11-05: 12.5 mg via INTRAVENOUS
  Filled 2015-11-05: qty 1

## 2015-11-05 NOTE — ED Notes (Signed)
Patient being d/c'd from IV, monitor, continuous pulse oximetry and blood pressure cuff; patient getting dressed to be discharged home

## 2015-11-05 NOTE — Discharge Instructions (Signed)

## 2015-11-05 NOTE — ED Provider Notes (Signed)
CSN: [id number]     Arrival date & time 11/05/15  0907 History   First MD Initiated Contact with Patient 11/05/15 [phone]     Chief Complaint  Patient presents with  . Hypertension  . Headache     (Consider location/radiation/quality/duration/timing/severity/associated sxs/prior Treatment) HPI   45 year old female with headache. Woke up with a headache this morning. She went to bed her usual state of health. She did feel nauseated this morning and vomited once. Headache has been pretty constant since she woke up. Has been stable in intensity. It is diffuse. Just generally does not feel well. She denies any fever. No neck pain or stiffness. No acute visual complaints. No acute numbness, tingling or focal loss of strength. She initially went to urgent care and was given a small dose of clonidine because she is noted to be severely hypertensive. She reports she was initially given aspirin. She denies any recent trauma. No known past history of aneurysm personally or familial.  Past Medical History  Diagnosis Date  . Hypertension   . Coronary artery disease   . High cholesterol   . Heart murmur   . Chest pain at rest     "and w/exertion; last couple months" (02/25/2013)  . Shortness of breath     "at rest and w/exertion last couple months" (02/25/2013)  . Daily headache   . Depression   . Type II diabetes mellitus ([place]) 12/2010    sister and son also diabetic    Past Surgical History  Procedure Laterality Date  . Coronary angioplasty with stent placement  02/25/2013    "2" (02/25/2013)  . Cholecystectomy  ~ 2000  . Tubal ligation  1994  . Left heart catheterization with coronary angiogram N/A 02/25/2013    Procedure: LEFT HEART CATHETERIZATION WITH CORONARY ANGIOGRAM;  Surgeon: Laverda Page, MD;  Location: [facility] CATH LAB;  Service: Cardiovascular;  Laterality: N/A;  . Left heart catheterization with coronary angiogram N/A 04/01/2013    Procedure: LEFT HEART CATHETERIZATION WITH CORONARY  ANGIOGRAM;  Surgeon: Laverda Page, MD;  Location: [facility] CATH LAB;  Service: Cardiovascular;  Laterality: N/A;  . Percutaneous coronary stent intervention (pci-s)  04/01/2013    Procedure: PERCUTANEOUS CORONARY STENT INTERVENTION (PCI-S);  Surgeon: Laverda Page, MD;  Location: [facility] CATH LAB;  Service: Cardiovascular;;   Family History  Problem Relation Age of Onset  . Diabetes Mother   . Diabetes Sister   . Diabetes Son     type 1  . Diabetes Sister   . Diabetes Son     type 2   Social History  Substance Use Topics  . Smoking status: Current Every Day Smoker -- 0.50 packs/day for 15 years    Types: Cigarettes  . Smokeless tobacco: Never Used     Comment: states she has cut back to 56 cigarettes/day   . Alcohol Use: 0.0 oz/week    0 Standard drinks or equivalent per week     Comment: 02/25/2013 "only on special occasions; maybe once/yr"   OB History    Gravida Para Term Preterm AB TAB SAB Ectopic Multiple Living   _0 0 0 0 0 0 0 3     Review of Systems  All systems reviewed and negative, other than as noted in HPI.    Allergies  Review of patient's allergies indicates no known allergies.  Home Medications   Prior to Admission medications   Medication Sig Start Date End Date Taking? Authorizing Provider  aspirin EC  81 MG tablet Take 1 tablet (81 mg total) by mouth daily. 11/18/14  Yes Arnoldo Morale, MD  atorvastatin (LIPITOR) 10 MG tablet Take 1 tablet (10 mg total) by mouth daily. Patient taking differently: Take 10 mg by mouth every morning.  11/18/14  Yes Arnoldo Morale, MD  buPROPion (WELLBUTRIN XL) 150 MG 24 hr tablet Take 2 tablets (300 mg total) by mouth daily. 150 mg once daily for 3 days; increase to 300 mg daily Patient taking differently: Take 300 mg by mouth daily.  03/03/15  Yes Josalyn Funches, MD  ferrous sulfate 325 (65 FE) MG tablet Take 325 mg by mouth daily with breakfast.   Yes Historical Provider, MD  furosemide (LASIX) 40 MG tablet Take 1 tablet (40 mg  total) by mouth daily. 11/27/14  Yes Arnoldo Morale, MD  glipiZIDE (GLUCOTROL) 10 MG tablet Take 1 tablet (10 mg total) by mouth 2 (two) times daily before a meal. 11/18/14  Yes Arnoldo Morale, MD  lisinopril (PRINIVIL,ZESTRIL) 10 MG tablet Take 1 tablet (10 mg total) by mouth daily. Discontinue HCTZ 11/18/14  Yes Arnoldo Morale, MD  metFORMIN (GLUCOPHAGE) 1000 MG tablet Take 1 tablet (1,000 mg total) by mouth 2 (two) times daily with a meal. 03/03/15  Yes Josalyn Funches, MD  metoprolol tartrate (LOPRESSOR) 25 MG tablet Take 1 tablet (25 mg total) by mouth 2 (two) times daily. 11/18/14  Yes Arnoldo Morale, MD  naproxen (NAPROSYN) 500 MG tablet Take 1 tablet (500 mg total) by mouth 2 (two) times daily. Patient taking differently: Take 500 mg by mouth daily as needed (headache).  11/09/14  Yes Benjamin Cartner, PA-C  nitroGLYCERIN (NITROSTAT) 0.4 MG SL tablet Place 0.4 mg under the tongue every 5 (five) minutes as needed for chest pain.   Yes Historical Provider, MD  omeprazole (PRILOSEC) 40 MG capsule Take 1 capsule (40 mg total) by mouth daily. 12/09/14  Yes Renella Cunas, MD  prasugrel (EFFIENT) 10 MG TABS tablet Take 1 tablet (10 mg total) by mouth daily. 11/18/14  Yes Arnoldo Morale, MD  Blood Glucose Monitoring Suppl (TRUE METRIX METER) W/DEVICE KIT 1 each by Does not apply route as needed. 03/03/15   Josalyn Funches, MD  glucose blood (TRUE METRIX BLOOD GLUCOSE TEST) test strip 1 each by Other route 3 (three) times daily. 03/03/15   Boykin Nearing, MD  TRUEPLUS LANCETS 28G MISC 1 each by Does not apply route 3 (three) times daily. 03/03/15   Josalyn Funches, MD   BP 173/90 mmHg  Pulse 75  Temp(Src) 98.6 F (37 C) (Oral)  Resp 18  SpO2 100% Physical Exam  Constitutional: She is oriented to person, place, and time. She appears well-developed and well-nourished. No distress.  HENT:  Head: Normocephalic and atraumatic.  Eyes: Conjunctivae and EOM are normal. Pupils are equal, round, and reactive to light. Right  eye exhibits no discharge. Left eye exhibits no discharge.  Neck: Neck supple.   No nuchal rigidity  Cardiovascular: Normal rate, regular rhythm and normal heart sounds.  Exam reveals no gallop and no friction rub.   No murmur heard. Pulmonary/Chest: Effort normal and breath sounds normal. No respiratory distress.  Abdominal: Soft. She exhibits no distension. There is no tenderness.  Musculoskeletal: She exhibits no edema or tenderness.  Neurological: She is alert and oriented to person, place, and time. No cranial nerve deficit. She exhibits normal muscle tone. Coordination normal.  Skin: Skin is warm and dry.  Psychiatric: She has a normal mood and affect. Her behavior is  normal. Thought content normal.  Nursing note and vitals reviewed.   ED Course  Procedures (including critical care time) Labs Review Labs Reviewed - No data to display  Imaging Review No results found. I have personally reviewed and evaluated these images and lab results as part of my medical decision-making.   EKG Interpretation None      MDM   Final diagnoses:  Nonintractable headache, unspecified chronicity pattern, unspecified headache type    44yF with headache. Suspect primary HA. Consider emergent secondary causes such as bleed, infectious or mass but doubt. There is no history of trauma. Pt has a nonfocal neurological exam. Afebrile and neck supple. No use of blood thinning medication. Consider ocular etiology such as acute angle closure glaucoma but doubt. Pt denies acute change in visual acuity and eye exam unremarkable. Doubt temporal arteritis given age, no temporal tenderness and temporal artery pulsations palpable. Doubt CO poisoning. No contacts with similar symptoms. Doubt venous thrombosis. Doubt carotid or vertebral arteries dissection. Symptoms improved with meds. Feel that can be safely discharged, but strict return precautions discussed. Outpt fu.     Virgel Manifold, MD 11/12/15 1233

## 2015-11-05 NOTE — ED Notes (Signed)
Pt is in stable condition upon d/c and is escorted from Ed via wheelchair. 

## 2015-11-05 NOTE — ED Notes (Signed)
Patient transported to X-ray 

## 2015-11-05 NOTE — ED Notes (Addendum)
Pt sts that she woke up this am with headache and vomiting. sts she went to work and didn't feel well so she went to [facility] and they took her BP and was 185/102. Was given clonidine 0.1 mg at [facility] and [facility]SA. sts that she hasnt had her BP meds in months.

## 2015-11-25 ENCOUNTER — Encounter: Payer: Self-pay | Admitting: Clinical

## 2015-11-25 ENCOUNTER — Ambulatory Visit: Payer: BLUE CROSS/BLUE SHIELD | Attending: Family Medicine | Admitting: Family Medicine

## 2015-11-25 ENCOUNTER — Encounter: Payer: Self-pay | Admitting: Family Medicine

## 2015-11-25 VITALS — BP 141/86 | HR 102 | Temp 98.6°F | Resp 16 | Ht 69.0 in | Wt 239.0 lb

## 2015-11-25 DIAGNOSIS — I5022 Chronic systolic (congestive) heart failure: Secondary | ICD-10-CM | POA: Diagnosis not present

## 2015-11-25 DIAGNOSIS — I2510 Atherosclerotic heart disease of native coronary artery without angina pectoris: Secondary | ICD-10-CM

## 2015-11-25 DIAGNOSIS — F17210 Nicotine dependence, cigarettes, uncomplicated: Secondary | ICD-10-CM | POA: Diagnosis not present

## 2015-11-25 DIAGNOSIS — Z7984 Long term (current) use of oral hypoglycemic drugs: Secondary | ICD-10-CM | POA: Insufficient documentation

## 2015-11-25 DIAGNOSIS — E669 Obesity, unspecified: Secondary | ICD-10-CM | POA: Diagnosis not present

## 2015-11-25 DIAGNOSIS — E1165 Type 2 diabetes mellitus with hyperglycemia: Secondary | ICD-10-CM | POA: Diagnosis not present

## 2015-11-25 DIAGNOSIS — IMO0001 Reserved for inherently not codable concepts without codable children: Secondary | ICD-10-CM

## 2015-11-25 DIAGNOSIS — Z6835 Body mass index (BMI) 35.0-35.9, adult: Secondary | ICD-10-CM | POA: Diagnosis not present

## 2015-11-25 DIAGNOSIS — Z79899 Other long term (current) drug therapy: Secondary | ICD-10-CM | POA: Diagnosis not present

## 2015-11-25 DIAGNOSIS — E119 Type 2 diabetes mellitus without complications: Secondary | ICD-10-CM | POA: Insufficient documentation

## 2015-11-25 DIAGNOSIS — L0292 Furuncle, unspecified: Secondary | ICD-10-CM

## 2015-11-25 LAB — GLUCOSE, POCT (MANUAL RESULT ENTRY): POC Glucose: 225 mg/dl — AB (ref 70–99)

## 2015-11-25 LAB — POCT GLYCOSYLATED HEMOGLOBIN (HGB A1C): HEMOGLOBIN A1C: 9.8

## 2015-11-25 MED ORDER — ACCU-CHEK AVIVA PLUS W/DEVICE KIT: 1.0000 | PACK | Freq: Three times a day (TID) | Status: DC

## 2015-11-25 MED ORDER — TRUEPLUS LANCETS 28G MISC: Status: DC

## 2015-11-25 MED ORDER — METOPROLOL TARTRATE 25 MG PO TABS: 25.0000 mg | ORAL_TABLET | Freq: Two times a day (BID) | ORAL | Status: DC

## 2015-11-25 MED ORDER — METFORMIN HCL ER 500 MG PO TB24: 1000.0000 mg | ORAL_TABLET | Freq: Every day | ORAL | Status: DC

## 2015-11-25 MED ORDER — LISINOPRIL 10 MG PO TABS: 10.0000 mg | ORAL_TABLET | Freq: Every day | ORAL | Status: DC

## 2015-11-25 MED ORDER — FLUCONAZOLE 150 MG PO TABS: 150.0000 mg | ORAL_TABLET | Freq: Every day | ORAL | Status: DC

## 2015-11-25 MED ORDER — ACCU-CHEK SOFTCLIX LANCETS MISC: 1.0000 | Freq: Three times a day (TID) | Status: DC

## 2015-11-25 MED ORDER — INSULIN PEN NEEDLE 31G X 8 MM MISC: 1.0000 "application " | Freq: Every day | Status: DC

## 2015-11-25 MED ORDER — LIRAGLUTIDE 18 MG/3ML ~~LOC~~ SOPN: PEN_INJECTOR | SUBCUTANEOUS | Status: DC

## 2015-11-25 MED ORDER — GLUCOSE BLOOD VI STRP: 1.0000 | ORAL_STRIP | Freq: Three times a day (TID) | Status: DC

## 2015-11-25 MED ORDER — ATORVASTATIN CALCIUM 10 MG PO TABS: 10.0000 mg | ORAL_TABLET | Freq: Every day | ORAL | Status: DC

## 2015-11-25 MED ORDER — TRUE METRIX METER W/DEVICE KIT: 1.0000 | PACK | Freq: Three times a day (TID) | Status: DC

## 2015-11-25 MED ORDER — SULFAMETHOXAZOLE-TRIMETHOPRIM 800-160 MG PO TABS: 1.0000 | ORAL_TABLET | Freq: Two times a day (BID) | ORAL | Status: DC

## 2015-11-25 MED ORDER — ASPIRIN EC 81 MG PO TBEC: 81.0000 mg | DELAYED_RELEASE_TABLET | Freq: Every day | ORAL | Status: DC

## 2015-11-25 MED ORDER — GLUCOSE BLOOD VI STRP: ORAL_STRIP | Status: DC

## 2015-11-25 MED FILL — ?ATORVASTATIN 10 MG TABLET: 10 | 30 days supply | Qty: 30 | Fill #0

## 2015-11-25 MED FILL — FLUCONAZOLE 150 MG TABLET: 150 | 1 days supply | Qty: 1 | Fill #0

## 2015-11-25 MED FILL — SULFAMETHOXAZOLE/TMP DS TAB: 800-160 | 10 days supply | Qty: 20 | Fill #0

## 2015-11-25 MED FILL — METOPROLOL TARTRATE 25 MG T: 25 | 30 days supply | Qty: 60 | Fill #0

## 2015-11-25 MED FILL — METFORMIN HCL ER 500 MG TAB: 500 | 30 days supply | Qty: 60 | Fill #0

## 2015-11-25 MED FILL — !VICTOZA 18MG/3ML INJECT: 18 | 30 days supply | Qty: 6 | Fill #0

## 2015-11-25 MED FILL — ULTICARE PEN NDL 4MM 32G: 32G X 4 MM | 30 days supply | Qty: 100 | Fill #0

## 2015-11-25 MED FILL — LISINOPRIL 10 MG TABLET: 10 | 30 days supply | Qty: 30 | Fill #0

## 2015-11-25 NOTE — Progress Notes (Signed)
Subjective:  Patient ID: Amy Chaney, female    DOB: April 15, 1971  Age: 45 y.o. MRN: [id number]  CC: Diabetes   HPI SHAVONNE AMBROISE presents to re-establish care, she is not taking any medication for over 4 months   1. CHRONIC DIABETES  Disease Monitoring  Blood Sugar Ranges: not checking   Polyuria: no   Visual problems: no   Medication Compliance: no  Medication Side Effects  Hypoglycemia: no   Preventitive Health Care  Eye Exam: due   Foot Exam: done today   Diet pattern: regular meals   Exercise: none   2. CAD/CHF: no meds. No CP. No edema. No SOB.   3. Boil: under L axilla. X 2 months. Tender. No fever or chills. Has hx of recurrent boils in her groin.   Social History  Substance Use Topics  . Smoking status: Current Every Day Smoker -- 0.50 packs/day for 15 years    Types: Cigarettes  . Smokeless tobacco: Never Used     Comment: states she has cut back to 56 cigarettes/day   . Alcohol Use: 0.0 oz/week    0 Standard drinks or equivalent per week     Comment: 02/25/2013 "only on special occasions; maybe once/yr"     Past Surgical History  Procedure Laterality Date  . Coronary angioplasty with stent placement  02/25/2013    "2" (02/25/2013)  . Cholecystectomy  ~ 2000  . Tubal ligation  1994  . Left heart catheterization with coronary angiogram N/A 02/25/2013    Procedure: LEFT HEART CATHETERIZATION WITH CORONARY ANGIOGRAM;  Surgeon: Laverda Page, MD;  Location[facility] CATH LAB;  Service: Cardiovascular;  Laterality: N/A;  . Left heart catheterization with coronary angiogram N/A 04/01/2013    Procedure: LEFT HEART CATHETERIZATION WITH CORONARY ANGIOGRAM;  Surgeon: Laverda Page, MD;  Location: [facility] CATH LAB;  Service: Cardiovascular;  Laterality: N/A;  . Percutaneous coronary stent intervention (pci-s)  04/01/2013    Procedure: PERCUTANEOUS CORONARY STENT INTERVENTION (PCI-S);  Surgeon: Laverda Page, MD;  Location: [facility] CATH LAB;  Service: Cardiovascular;;     Outpatient Prescriptions Prior to Visit  Medication Sig Dispense Refill  . aspirin EC 81 MG tablet Take 1 tablet (81 mg total) by mouth daily. 30 tablet 2  . atorvastatin (LIPITOR) 10 MG tablet Take 1 tablet (10 mg total) by mouth daily. (Patient taking differently: Take 10 mg by mouth every morning. ) 30 tablet 2  . Blood Glucose Monitoring Suppl (TRUE METRIX METER) W/DEVICE KIT 1 each by Does not apply route as needed. 1 kit 0  . buPROPion (WELLBUTRIN XL) 150 MG 24 hr tablet Take 2 tablets (300 mg total) by mouth daily. 150 mg once daily for 3 days; increase to 300 mg daily (Patient taking differently: Take 300 mg by mouth daily. ) 60 tablet 1  . ferrous sulfate 325 (65 FE) MG tablet Take 325 mg by mouth daily with breakfast.    . furosemide (LASIX) 40 MG tablet Take 1 tablet (40 mg total) by mouth daily. 30 tablet 3  . glipiZIDE (GLUCOTROL) 10 MG tablet Take 1 tablet (10 mg total) by mouth 2 (two) times daily before a meal. 60 tablet 2  . glucose blood (TRUE METRIX BLOOD GLUCOSE TEST) test strip 1 each by Other route 3 (three) times daily. 100 each 11  . lisinopril (PRINIVIL,ZESTRIL) 10 MG tablet Take 1 tablet (10 mg total) by mouth daily. Discontinue HCTZ 30 tablet 2  . metFORMIN (GLUCOPHAGE) 1000 MG tablet  Take 1 tablet (1,000 mg total) by mouth 2 (two) times daily with a meal. 60 tablet 3  . metoprolol tartrate (LOPRESSOR) 25 MG tablet Take 1 tablet (25 mg total) by mouth 2 (two) times daily. 30 tablet 2  . naproxen (NAPROSYN) 500 MG tablet Take 1 tablet (500 mg total) by mouth 2 (two) times daily. (Patient taking differently: Take 500 mg by mouth daily as needed (headache). ) 30 tablet 0  . nitroGLYCERIN (NITROSTAT) 0.4 MG SL tablet Place 0.4 mg under the tongue every 5 (five) minutes as needed for chest pain.    Marland Kitchen omeprazole (PRILOSEC) 40 MG capsule Take 1 capsule (40 mg total) by mouth daily. 30 capsule 2  . prasugrel (EFFIENT) 10 MG TABS tablet Take 1 tablet (10 mg total) by mouth  daily. 30 tablet 0  . TRUEPLUS LANCETS 28G MISC 1 each by Does not apply route 3 (three) times daily. 100 each 11   No facility-administered medications prior to visit.    ROS Review of Systems  Constitutional: Negative for fever and chills.  Eyes: Negative for visual disturbance.  Respiratory: Negative for shortness of breath.   Cardiovascular: Negative for chest pain.  Gastrointestinal: Negative for abdominal pain and blood in stool.  Musculoskeletal: Negative for back pain and arthralgias.  Skin: Negative for rash.  Allergic/Immunologic: Negative for immunocompromised state.  Hematological: Negative for adenopathy. Does not bruise/bleed easily.  Psychiatric/Behavioral: Negative for suicidal ideas and dysphoric mood.    Objective:  BP 141/86 mmHg  Pulse 102  Temp(Src) 98.6 F (37 C) (Oral)  Resp 16  Ht _0  (1.753 m)  Wt 239 lb (108.41 kg)  BMI 35.28 kg/m2  SpO2 100%  LMP 10/20/2015  BP/Weight 11/25/2015 10/30/7406 08/03/4816  Systolic BP 563 149 702  Diastolic BP 86 98 85  Wt. (Lbs) 239 - 240  BMI 35.28 - 35.43    Physical Exam  Constitutional: She is oriented to person, place, and time. She appears well-developed and well-nourished. No distress.  HENT:  Head: Normocephalic and atraumatic.  Cardiovascular: Normal rate, regular rhythm, normal heart sounds and intact distal pulses.   Pulmonary/Chest: Effort normal and breath sounds normal.  Musculoskeletal: She exhibits no edema.  Neurological: She is alert and oriented to person, place, and time.  Skin: Skin is warm and dry. No rash noted.     Psychiatric: She has a normal mood and affect.   Lab Results  Component Value Date   HGBA1C 9.8 11/25/2015   CBG 225   Assessment & Plan:   Diagnoses and all orders for this visit:  Uncontrolled type 2 diabetes mellitus without complication, without long-term current use of insulin (HCC) -     POCT glycosylated hemoglobin (Hb A1C) -     POCT glucose (manual  entry) -     Liraglutide 18 MG/3ML SOPN; 0.6 mg daily for one week, then 1.2 mg daily -     metFORMIN (GLUCOPHAGE-XR) 500 MG 24 hr tablet; Take 2 tablets (1,000 mg total) by mouth daily with breakfast. -     Insulin Pen Needle (B-D ULTRAFINE III SHORT PEN) 31G X 8 MM MISC; 1 application by Does not apply route daily.  Obesity (BMI 35.0-39.9 without comorbidity) (HCC)  Chronic systolic congestive heart failure (HCC) -     lisinopril (PRINIVIL,ZESTRIL) 10 MG tablet; Take 1 tablet (10 mg total) by mouth daily. -     metoprolol tartrate (LOPRESSOR) 25 MG tablet; Take 1 tablet (25 mg total) by mouth 2 (two)  times daily. -     Ambulatory referral to Cardiology  Atherosclerosis of native coronary artery of native heart without angina pectoris -     aspirin EC 81 MG tablet; Take 1 tablet (81 mg total) by mouth daily. -     atorvastatin (LIPITOR) 10 MG tablet; Take 1 tablet (10 mg total) by mouth daily. -     Ambulatory referral to Cardiology  Boils -     sulfamethoxazole-trimethoprim (BACTRIM DS,SEPTRA DS) 800-160 MG tablet; Take 1 tablet by mouth 2 (two) times daily. -     fluconazole (DIFLUCAN) 150 MG tablet; Take 1 tablet (150 mg total) by mouth daily.    No orders of the defined types were placed in this encounter.    Follow-up: No Follow-up on file.   Boykin Nearing MD

## 2015-11-25 NOTE — Addendum Note (Signed)
Addended by: Betti Cruz on: 11/25/2015 04:05 PM   Modules accepted: Orders

## 2015-11-25 NOTE — Progress Notes (Signed)
Depression screen [facility] 2/9 11/25/2015 03/03/2015 11/18/2014 11/18/2014  Decreased Interest 2 2 1 2   Down, Depressed, Hopeless 2 3 1 1   PHQ - 2 Score 4 5 2 3   Altered sleeping 1 1 3  -  Tired, decreased energy 3 3 2  -  Change in appetite 3 0 1 -  Feeling bad or failure about yourself  3 3 3  -  Trouble concentrating 0 0 0 -  Moving slowly or fidgety/restless 0 2 0 -  Suicidal thoughts 0 3 1 -  PHQ-9 Score 14 17 12  -    GAD 7 : Generalized Anxiety Score 11/25/2015  Nervous, Anxious, on Edge 1  Control/stop worrying 3  Worry too much - different things 3  Trouble relaxing 0  Restless 0  Easily annoyed or irritable 0  Afraid - awful might happen 3  Total GAD 7 Score 10

## 2015-11-25 NOTE — Progress Notes (Signed)
F/U DM  No medication x4 month, not been checking glucose  Tobacco user 6-7 cigarette per day  No suicidal thought in the past two weeks  No pain today

## 2015-11-25 NOTE — Patient Instructions (Addendum)
Sherisa was seen today for diabetes.  Diagnoses and all orders for this visit:  Uncontrolled type 2 diabetes mellitus without complication, without long-term current use of insulin (HCC) -     POCT glycosylated hemoglobin (Hb A1C) -     POCT glucose (manual entry) -     Liraglutide 18 MG/3ML SOPN; 0.6 mg daily for one week, then 1.2 mg daily -     metFORMIN (GLUCOPHAGE-XR) 500 MG 24 hr tablet; Take 2 tablets (1,000 mg total) by mouth daily with breakfast. -     Insulin Pen Needle (B-D ULTRAFINE III SHORT PEN) 31G X 8 MM MISC; 1 application by Does not apply route daily. -     Blood Glucose Monitoring Suppl (ACCU-CHEK AVIVA PLUS) w/Device KIT; 1 Device by Does not apply route 3 (three) times daily after meals. -     glucose blood (ACCU-CHEK AVIVA PLUS) test strip; 1 each by Other route 3 (three) times daily. -     ACCU-CHEK SOFTCLIX LANCETS lancets; 1 each by Other route 3 (three) times daily.  Obesity (BMI 35.0-39.9 without comorbidity) (HCC)  Chronic systolic congestive heart failure (HCC) -     lisinopril (PRINIVIL,ZESTRIL) 10 MG tablet; Take 1 tablet (10 mg total) by mouth daily. -     metoprolol tartrate (LOPRESSOR) 25 MG tablet; Take 1 tablet (25 mg total) by mouth 2 (two) times daily. -     Ambulatory referral to Cardiology  Atherosclerosis of native coronary artery of native heart without angina pectoris -     aspirin EC 81 MG tablet; Take 1 tablet (81 mg total) by mouth daily. -     atorvastatin (LIPITOR) 10 MG tablet; Take 1 tablet (10 mg total) by mouth daily. -     Ambulatory referral to Cardiology  Boils -     sulfamethoxazole-trimethoprim (BACTRIM DS,SEPTRA DS) 800-160 MG tablet; Take 1 tablet by mouth 2 (two) times daily. -     fluconazole (DIFLUCAN) 150 MG tablet; Take 1 tablet (150 mg total) by mouth daily.  Diabetes blood sugar goals  Fasting (in AM before breakfast, 8 hrs of no eating or drinking (except water or unsweetened coffee or tea): 90-110 2 hrs after meals: <  160,   No low sugars: nothing < 70   Cardiology referral placed for CHF and CAD  effient not needed since it has been longer than one year since your stent placement   F/u in 4-6 weeks for pap smear   Dr. Adrian Blackwater

## 2015-12-10 ENCOUNTER — Encounter: Payer: Self-pay | Admitting: Cardiology

## 2015-12-28 ENCOUNTER — Ambulatory Visit: Payer: BLUE CROSS/BLUE SHIELD | Admitting: Cardiology

## 2015-12-30 HISTORY — PX: TRANSTHORACIC ECHOCARDIOGRAM: SHX275

## 2015-12-30 HISTORY — PX: NM MYOVIEW LTD: HXRAD82

## 2016-01-11 ENCOUNTER — Encounter: Payer: Self-pay | Admitting: Cardiology

## 2016-01-11 ENCOUNTER — Ambulatory Visit (INDEPENDENT_AMBULATORY_CARE_PROVIDER_SITE_OTHER): Payer: BLUE CROSS/BLUE SHIELD | Admitting: Cardiology

## 2016-01-11 VITALS — BP 130/80 | HR 68 | Ht 69.0 in | Wt 240.0 lb

## 2016-01-11 DIAGNOSIS — E7800 Pure hypercholesterolemia, unspecified: Secondary | ICD-10-CM

## 2016-01-11 DIAGNOSIS — R9431 Abnormal electrocardiogram [ECG] [EKG]: Secondary | ICD-10-CM | POA: Insufficient documentation

## 2016-01-11 DIAGNOSIS — R0989 Other specified symptoms and signs involving the circulatory and respiratory systems: Secondary | ICD-10-CM

## 2016-01-11 DIAGNOSIS — I10 Essential (primary) hypertension: Secondary | ICD-10-CM

## 2016-01-11 DIAGNOSIS — Z720 Tobacco use: Secondary | ICD-10-CM

## 2016-01-11 DIAGNOSIS — I5042 Chronic combined systolic (congestive) and diastolic (congestive) heart failure: Secondary | ICD-10-CM

## 2016-01-11 DIAGNOSIS — R079 Chest pain, unspecified: Secondary | ICD-10-CM

## 2016-01-11 DIAGNOSIS — I779 Disorder of arteries and arterioles, unspecified: Secondary | ICD-10-CM | POA: Insufficient documentation

## 2016-01-11 DIAGNOSIS — I25119 Atherosclerotic heart disease of native coronary artery with unspecified angina pectoris: Secondary | ICD-10-CM | POA: Diagnosis not present

## 2016-01-11 DIAGNOSIS — R011 Cardiac murmur, unspecified: Secondary | ICD-10-CM

## 2016-01-11 DIAGNOSIS — R002 Palpitations: Secondary | ICD-10-CM | POA: Insufficient documentation

## 2016-01-11 DIAGNOSIS — Z716 Tobacco abuse counseling: Secondary | ICD-10-CM

## 2016-01-11 DIAGNOSIS — I739 Peripheral vascular disease, unspecified: Secondary | ICD-10-CM

## 2016-01-11 DIAGNOSIS — I4581 Long QT syndrome: Secondary | ICD-10-CM

## 2016-01-11 HISTORY — DX: Tobacco use: Z72.0

## 2016-01-11 MED ORDER — NITROGLYCERIN 0.4 MG SL SUBL: 0.4000 mg | SUBLINGUAL_TABLET | SUBLINGUAL | Status: DC | PRN

## 2016-01-11 NOTE — Progress Notes (Signed)
Cardiology Office Note    Date:  01/11/2016   ID:  Amy Chaney, DOB [birth year], MRN [id number]  PCP:  Minerva Ends, MD  Cardiologist:  Fransico Him, MD   Chief Complaint  Patient presents with  . Coronary Artery Disease  . Hypertension  . Hyperlipidemia    History of Present Illness:  Amy Chaney is a 45 y.o. female with a history of combined systolic/diastolic CHF with EF 96-78%, DM, HTN, dyslipidemia and CAD s/p PCI of CTO of the RCA 01/2013 and then PCI of the mid RCA for subacute instent thrombosis. She had been followed by Dr. Einar Gip in the past. She is on DAPT and statins.  Her BP has been poorly controlled in the past due to dietary noncompliance and obesity.  She says that she has been having chest pain for the past few months occurring about 3 times weekly.  She says that the pain occurs for about 30 minutes and resolves with rest.  The CP usually occurs when she is standing at work but occasionally occurs when she is laying down.  She says that the discomfort is similar to the pain she has had in the past.  She also has had some DOE for the past few months as well.  She has noticed some diaphoresis and nausea with the episodes of CP.  She denies any claudication symptoms.  She has also noticed some fluttering in her chest that she says beats very fast.  She denies any dizziness or syncope.  She has not noticed any LE edema.      Past Medical History  Diagnosis Date  . Hypertension   . Coronary artery disease 2014    s/p PCI of the mid RCA 01/2013 and repeat PCI 03/2013 for subacute thrombosis of mid RCA   . Dyslipidemia, goal LDL below 70   . Heart murmur   . Shortness of breath     "at rest and w/exertion last couple months" (02/25/2013)  . Daily headache   . Depression   . Type II diabetes mellitus ([place]) 12/2010    sister and son also diabetic   . Bilateral carotid bruits 01/11/2016  . Prolonged Q-T interval on ECG 01/11/2016  . Tobacco abuse counseling  01/11/2016    Past Surgical History  Procedure Laterality Date  . Coronary angioplasty with stent placement  02/25/2013    "2" (02/25/2013)  . Cholecystectomy  ~ 2000  . Tubal ligation  1994  . Left heart catheterization with coronary angiogram N/A 02/25/2013    Procedure: LEFT HEART CATHETERIZATION WITH CORONARY ANGIOGRAM;  Surgeon: Laverda Page, MD;  Location: [facility] CATH LAB;  Service: Cardiovascular;  Laterality: N/A;  . Left heart catheterization with coronary angiogram N/A 04/01/2013    Procedure: LEFT HEART CATHETERIZATION WITH CORONARY ANGIOGRAM;  Surgeon: Laverda Page, MD;  Location: [facility] CATH LAB;  Service: Cardiovascular;  Laterality: N/A;  . Percutaneous coronary stent intervention (pci-s)  04/01/2013    Procedure: PERCUTANEOUS CORONARY STENT INTERVENTION (PCI-S);  Surgeon: Laverda Page, MD;  Location: [facility] CATH LAB;  Service: Cardiovascular;;    Current Medications: Outpatient Prescriptions Prior to Visit  Medication Sig Dispense Refill  . aspirin EC 81 MG tablet Take 1 tablet (81 mg total) by mouth daily. 90 tablet 3  . atorvastatin (LIPITOR) 10 MG tablet Take 1 tablet (10 mg total) by mouth daily. 90 tablet 3  . Blood Glucose Monitoring Suppl (TRUE METRIX METER) w/Device KIT 1 each by Does not  apply route 3 (three) times daily. 1 kit 0  . ferrous sulfate 325 (65 FE) MG tablet Take 325 mg by mouth daily with breakfast. Reported on 11/25/2015    . Insulin Pen Needle (B-D ULTRAFINE III SHORT PEN) 31G X 8 MM MISC 1 application by Does not apply route daily. 100 each 3  . Liraglutide 18 MG/3ML SOPN 0.6 mg daily for one week, then 1.2 mg daily 6 mL 3  . lisinopril (PRINIVIL,ZESTRIL) 10 MG tablet Take 1 tablet (10 mg total) by mouth daily. 90 tablet 3  . metFORMIN (GLUCOPHAGE-XR) 500 MG 24 hr tablet Take 2 tablets (1,000 mg total) by mouth daily with breakfast. 180 tablet 3  . metoprolol tartrate (LOPRESSOR) 25 MG tablet Take 1 tablet (25 mg total) by mouth 2 (two) times daily. 180  tablet 3  . TRUEPLUS LANCETS 28G MISC 1 each by Other route 3 (three) times daily. 102 each 12  . nitroGLYCERIN (NITROSTAT) 0.4 MG SL tablet Place 0.4 mg under the tongue every 5 (five) minutes as needed for chest pain. Reported on 11/25/2015    . ACCU-CHEK SOFTCLIX LANCETS lancets 1 each by Other route 3 (three) times daily. (Patient not taking: Reported on 01/11/2016) 100 each 12  . Blood Glucose Monitoring Suppl (ACCU-CHEK AVIVA PLUS) w/Device KIT 1 Device by Does not apply route 3 (three) times daily after meals. (Patient not taking: Reported on 01/11/2016) 1 kit 0  . fluconazole (DIFLUCAN) 150 MG tablet Take 1 tablet (150 mg total) by mouth daily. (Patient not taking: Reported on 01/11/2016) 1 tablet 0  . glipiZIDE (GLUCOTROL) 10 MG tablet Take 1 tablet (10 mg total) by mouth 2 (two) times daily before a meal. (Patient not taking: Reported on 11/25/2015) 60 tablet 2  . glucose blood (ACCU-CHEK AVIVA PLUS) test strip 1 each by Other route 3 (three) times daily. (Patient not taking: Reported on 01/11/2016) 100 each 12  . glucose blood test strip 1 each by Other route 3 (three) times daily. 100 each 12  . omeprazole (PRILOSEC) 40 MG capsule Take 1 capsule (40 mg total) by mouth daily. (Patient not taking: Reported on 11/25/2015) 30 capsule 2  . sulfamethoxazole-trimethoprim (BACTRIM DS,SEPTRA DS) 800-160 MG tablet Take 1 tablet by mouth 2 (two) times daily. (Patient not taking: Reported on 01/11/2016) 20 tablet 0  . TRUEPLUS LANCETS 28G MISC 1 each by Does not apply route 3 (three) times daily. (Patient not taking: Reported on 11/25/2015) 100 each 11   No facility-administered medications prior to visit.     Allergies:   Review of patient's allergies indicates no known allergies.   Social History   Social History  . Marital Status: Single    Spouse Name: N/A  . Number of Children: N/A  . Years of Education: N/A   Social History Main Topics  . Smoking status: Current Every Day Smoker -- 0.50  packs/day for 15 years    Types: Cigarettes  . Smokeless tobacco: Never Used     Comment: states she has cut back to 56 cigarettes/day   . Alcohol Use: 0.0 oz/week    0 Standard drinks or equivalent per week     Comment: 02/25/2013 "only on special occasions; maybe once/yr"  . Drug Use: Yes    Special: Marijuana     Comment: 02/25/2013 "quit marijuana ~ 2001"  . Sexual Activity: Not Currently    Birth Control/ Protection: Surgical   Other Topics Concern  . None   Social History Narrative  Family History:  The patient's family history includes Diabetes in her mother, sister, sister, son, and son.   ROS:   Please see the history of present illness.    ROS All other systems reviewed and are negative.   PHYSICAL EXAM:   VS:  BP 130/80 mmHg  Pulse 68  Ht _0  (1.753 m)  Wt 240 lb (108.863 kg)  BMI 35.43 kg/m2   GEN: Well nourished, well developed, in no acute distress HEENT: normal Neck: no JVD or masses.  Bilateral carotid artery bruits Cardiac: RRR; no rubs, or gallops,no edema.  Intact distal pulses bilaterally. 2/6 SM at RUSB to LLSB Respiratory:  clear to auscultation bilaterally, normal work of breathing GI: soft, nontender, nondistended, + BS MS: no deformity or atrophy Skin: warm and dry, no rash Neuro:  Alert and Oriented x 3, Strength and sensation are intact Psych: euthymic mood, full affect  Wt Readings from Last 3 Encounters:  01/11/16 240 lb (108.863 kg)  11/25/15 239 lb (108.41 kg)  03/03/15 240 lb (108.863 kg)      Studies/Labs Reviewed:   EKG:  EKG is ordered today and showed sinus bradycardia at 55bpm with LVH by voltage and no ST changes.  QTc normal at 457 msec.   Recent Labs: 02/17/2015: ALT 8 11/05/2015: BUN 8; Creatinine, Ser 0.56; Hemoglobin 11.9*; Platelets 313; Potassium 3.8; Sodium 137   Lipid Panel    Component Value Date/Time   CHOL 163 02/17/2015 1031   TRIG 81 02/17/2015 1031   HDL 43* 02/17/2015 1031   CHOLHDL 3.8 02/17/2015  1031   VLDL 16 02/17/2015 1031   LDLCALC 104* 02/17/2015 1031    Additional studies/ records that were reviewed today include:  Prior OV notes, echo and cath report    ASSESSMENT:    1. Coronary artery disease involving native coronary artery of native heart with angina pectoris ([place])   2. Essential hypertension   3. Hypercholesterolemia   4. Bilateral carotid bruits   5. Prolonged Q-T interval on ECG   6. Tobacco abuse counseling   7. Heart palpitations   8. Chest pain, unspecified chest pain type   9. Heart murmur   10. Chronic combined systolic (congestive) and diastolic (congestive) heart failure ([facility])      PLAN:  In order of problems listed above:  1. ASCAD s/p remote PCI of the RCA x 2 now with CP consistent with her typical angina with associated SOB..  I will get a stress myoview to assess for ischemia.  Continue on ASA/statin/BB.  2. HTN - BP well controlled on current medical regimen.  Continue BB/ACE I 3.   Dyslipidemia - LDL goal < 70.  Continue statin.  Check FLP and ALT. 4.   Bilateral carotid bruits - check carotid dopplers.   5.   Prolonged QT on EKG 10/2015.  She is not on any QT prolonging drugs.  Repeat EKG with normal QTc. 6.   Tobacco abuse counseling done today.  She wants to try to quit on her own before trying smoking cessation drugs.  7.   Heart palpitations - I will get an event monitor for assessment. 8.   DCM - EF 45-50% by last assessment.  Echo pending. 9.   Chronic combined systolic/diastolic CHF - she appears euvolemic on exam today. Continue ACE I. No BB due to resting bradycardia.      Medication Adjustments/Labs and Tests Ordered: Current medicines are reviewed at length with the patient today.  Concerns regarding medicines  are outlined above.  Medication changes, Labs and Tests ordered today are listed in the Patient Instructions below.  Patient Instructions  Medication Instructions:  Your physician recommends that you continue on your  current medications as directed. Please refer to the Current Medication list given to you today.   Labwork: Your physician recommends that you return for FASTING lab work the same day as your stress test.  Testing/Procedures: Your physician has requested that you have an echocardiogram. Echocardiography is a painless test that uses sound waves to create images of your heart. It provides your doctor with information about the size and shape of your heart and how well your heart's chambers and valves are working. This procedure takes approximately one hour. There are no restrictions for this procedure.   Your physician has requested that you have a carotid duplex. This test is an ultrasound of the carotid arteries in your neck. It looks at blood flow through these arteries that supply the brain with blood. Allow one hour for this exam. There are no restrictions or special instructions.  Dr. Radford Pax recommends you have a NUCLEAR STRESS TEST.  Marland KitchenYour physician has recommended that you wear an event monitor. Event monitors are medical devices that record the heart's electrical activity. Doctors most often [place] these monitors to diagnose arrhythmias. Arrhythmias are problems with the speed or rhythm of the heartbeat. The monitor is a small, portable device. You can wear one while you do your normal daily activities. This is usually used to diagnose what is causing palpitations/syncope (passing out).  Follow-Up: Your physician wants you to follow-up in 1 year with Dr. Radford Pax. You will receive a reminder letter in the mail two months in advance. If you don't receive a letter, please call our office to schedule the follow-up appointment.   Any Other Special Instructions Will Be Listed Below (If Applicable).     If you need a refill on your cardiac medications before your next appointment, please call your pharmacy.       Signed, Fransico Him, MD  01/11/2016 10:42 AM    [facility] [place], [place], [place]  [address] Phone: [phone]; Fax: [phone]

## 2016-01-11 NOTE — Patient Instructions (Addendum)
Medication Instructions:  Your physician recommends that you continue on your current medications as directed. Please refer to the Current Medication list given to you today.   Labwork: Your physician recommends that you return for FASTING lab work the same day as your stress test.  Testing/Procedures: Your physician has requested that you have an echocardiogram. Echocardiography is a painless test that uses sound waves to create images of your heart. It provides your doctor with information about the size and shape of your heart and how well your heart's chambers and valves are working. This procedure takes approximately one hour. There are no restrictions for this procedure.   Your physician has requested that you have a carotid duplex. This test is an ultrasound of the carotid arteries in your neck. It looks at blood flow through these arteries that supply the brain with blood. Allow one hour for this exam. There are no restrictions or special instructions.  Dr. Radford Pax recommends you have a NUCLEAR STRESS TEST.  Marland KitchenYour physician has recommended that you wear an event monitor. Event monitors are medical devices that record the heart's electrical activity. Doctors most often [place] these monitors to diagnose arrhythmias. Arrhythmias are problems with the speed or rhythm of the heartbeat. The monitor is a small, portable device. You can wear one while you do your normal daily activities. This is usually used to diagnose what is causing palpitations/syncope (passing out).  Follow-Up: Your physician wants you to follow-up in 1 year with Dr. Radford Pax. You will receive a reminder letter in the mail two months in advance. If you don't receive a letter, please call our office to schedule the follow-up appointment.   Any Other Special Instructions Will Be Listed Below (If Applicable).     If you need a refill on your cardiac medications before your next appointment, please call your pharmacy.

## 2016-01-20 ENCOUNTER — Telehealth (HOSPITAL_COMMUNITY): Payer: Self-pay | Admitting: *Deleted

## 2016-01-20 NOTE — Telephone Encounter (Signed)
Attempted to call patient regarding upcoming appointment- no answer. Amy Chaney J Anissa Abbs, RN 

## 2016-01-25 ENCOUNTER — Ambulatory Visit (HOSPITAL_COMMUNITY): Payer: BLUE CROSS/BLUE SHIELD | Attending: Cardiology

## 2016-01-25 DIAGNOSIS — E119 Type 2 diabetes mellitus without complications: Secondary | ICD-10-CM | POA: Insufficient documentation

## 2016-01-25 DIAGNOSIS — R079 Chest pain, unspecified: Secondary | ICD-10-CM | POA: Insufficient documentation

## 2016-01-25 DIAGNOSIS — R0609 Other forms of dyspnea: Secondary | ICD-10-CM | POA: Insufficient documentation

## 2016-01-25 DIAGNOSIS — I119 Hypertensive heart disease without heart failure: Secondary | ICD-10-CM | POA: Diagnosis not present

## 2016-01-25 DIAGNOSIS — R0602 Shortness of breath: Secondary | ICD-10-CM | POA: Diagnosis not present

## 2016-01-25 MED ORDER — TECHNETIUM TC 99M TETROFOSMIN IV KIT
33.0000 | PACK | Freq: Once | INTRAVENOUS | Status: AC | PRN
  Administered 2016-01-25: 33 via INTRAVENOUS
  Filled 2016-01-25: qty 33

## 2016-01-26 ENCOUNTER — Ambulatory Visit (HOSPITAL_COMMUNITY): Payer: BLUE CROSS/BLUE SHIELD

## 2016-01-26 ENCOUNTER — Ambulatory Visit (INDEPENDENT_AMBULATORY_CARE_PROVIDER_SITE_OTHER): Payer: BLUE CROSS/BLUE SHIELD

## 2016-01-26 DIAGNOSIS — R002 Palpitations: Secondary | ICD-10-CM | POA: Diagnosis not present

## 2016-01-26 LAB — MYOCARDIAL PERFUSION IMAGING
CHL CUP NUCLEAR SRS: 1
CHL CUP NUCLEAR SSS: 2
CSEPED: 5 min
CSEPEDS: 30 s
CSEPPHR: 164 {beats}/min
Estimated workload: 7 METS
LVDIAVOL: 150 mL (ref 46–106)
LVSYSVOL: 92 mL
MPHR: 174 {beats}/min
NUC STRESS TID: 1.01
Percent HR: 94 %
RATE: 0.36
RPE: 18
Rest HR: 88 {beats}/min
SDS: 1

## 2016-01-26 MED ORDER — TECHNETIUM TC 99M TETROFOSMIN IV KIT
32.2000 | PACK | Freq: Once | INTRAVENOUS | Status: AC | PRN
  Administered 2016-01-26: 32.2 via INTRAVENOUS
  Filled 2016-01-26: qty 32

## 2016-01-27 ENCOUNTER — Ambulatory Visit (HOSPITAL_COMMUNITY)
Admission: RE | Admit: 2016-01-27 | Discharge: 2016-01-27 | Disposition: A | Discharge status: Home / Self Care | Payer: BLUE CROSS/BLUE SHIELD | Source: Ambulatory Visit | Attending: Cardiology | Admitting: Cardiology

## 2016-01-27 ENCOUNTER — Other Ambulatory Visit: Payer: Self-pay

## 2016-01-27 ENCOUNTER — Other Ambulatory Visit (INDEPENDENT_AMBULATORY_CARE_PROVIDER_SITE_OTHER): Payer: BLUE CROSS/BLUE SHIELD | Admitting: *Deleted

## 2016-01-27 ENCOUNTER — Ambulatory Visit (HOSPITAL_BASED_OUTPATIENT_CLINIC_OR_DEPARTMENT_OTHER): Payer: BLUE CROSS/BLUE SHIELD

## 2016-01-27 DIAGNOSIS — E785 Hyperlipidemia, unspecified: Secondary | ICD-10-CM | POA: Insufficient documentation

## 2016-01-27 DIAGNOSIS — R011 Cardiac murmur, unspecified: Secondary | ICD-10-CM

## 2016-01-27 DIAGNOSIS — I2510 Atherosclerotic heart disease of native coronary artery without angina pectoris: Secondary | ICD-10-CM | POA: Diagnosis not present

## 2016-01-27 DIAGNOSIS — F329 Major depressive disorder, single episode, unspecified: Secondary | ICD-10-CM | POA: Diagnosis not present

## 2016-01-27 DIAGNOSIS — I6523 Occlusion and stenosis of bilateral carotid arteries: Secondary | ICD-10-CM | POA: Insufficient documentation

## 2016-01-27 DIAGNOSIS — E7800 Pure hypercholesterolemia, unspecified: Secondary | ICD-10-CM | POA: Diagnosis not present

## 2016-01-27 DIAGNOSIS — I119 Hypertensive heart disease without heart failure: Secondary | ICD-10-CM | POA: Insufficient documentation

## 2016-01-27 DIAGNOSIS — R0989 Other specified symptoms and signs involving the circulatory and respiratory systems: Secondary | ICD-10-CM | POA: Insufficient documentation

## 2016-01-27 DIAGNOSIS — E119 Type 2 diabetes mellitus without complications: Secondary | ICD-10-CM | POA: Insufficient documentation

## 2016-01-27 DIAGNOSIS — Z720 Tobacco use: Secondary | ICD-10-CM | POA: Insufficient documentation

## 2016-01-27 DIAGNOSIS — I340 Nonrheumatic mitral (valve) insufficiency: Secondary | ICD-10-CM | POA: Diagnosis not present

## 2016-01-27 DIAGNOSIS — I25119 Atherosclerotic heart disease of native coronary artery with unspecified angina pectoris: Secondary | ICD-10-CM

## 2016-01-27 LAB — ECHOCARDIOGRAM COMPLETE
CHL CUP MV DEC (S): 229
EERAT: 9
EWDT: 229 ms
FS: 30 % (ref 28–44)
IVS/LV PW RATIO, ED: 0.79
LA vol index: 27.8 mL/m2
LADIAMINDEX: 1.79 cm/m2
LASIZE: 40 mm
LAVOL: 62 mL
LAVOLA4C: 55 mL
LEFT ATRIUM END SYS DIAM: 40 mm
LV E/e' medial: 9
LV TDI E'MEDIAL: 9.32
LV e' LATERAL: 10.2 cm/s
LVEEAVG: 9
LVOT VTI: 21.8 cm
LVOT area: 3.14 cm2
LVOT diameter: 20 mm
LVOTPV: 92.5 cm/s
LVOTSV: 68 mL
MV pk A vel: 112 m/s
MV pk E vel: 91.8 m/s
MVPG: 3 mmHg
PW: 13.9 mm — AB (ref 0.6–1.1)
Reg peak vel: 217 cm/s
TDI e' lateral: 10.2
TR max vel: 217 cm/s

## 2016-01-27 LAB — LIPID PANEL
CHOLESTEROL: 177 mg/dL (ref 125–200)
HDL: 47 mg/dL (ref >=46)
LDL Cholesterol: 105 mg/dL (ref <130)
TRIGLYCERIDES: 125 mg/dL (ref <150)
Total CHOL/HDL Ratio: 3.8 Ratio (ref <=5.0)
VLDL: 25 mg/dL (ref <30)

## 2016-01-27 LAB — HEPATIC FUNCTION PANEL
ALBUMIN: 3.7 g/dL (ref 3.6–5.1)
ALT: 9 U/L (ref 6–29)
AST: 10 U/L (ref 10–35)
Alkaline Phosphatase: 47 U/L (ref 33–115)
BILIRUBIN DIRECT: 0.1 mg/dL (ref <=0.2)
BILIRUBIN INDIRECT: 0.4 mg/dL (ref 0.2–1.2)
BILIRUBIN TOTAL: 0.5 mg/dL (ref 0.2–1.2)
TOTAL PROTEIN: 7.3 g/dL (ref 6.1–8.1)

## 2016-01-28 ENCOUNTER — Telehealth: Payer: Self-pay | Admitting: Cardiology

## 2016-01-28 NOTE — Telephone Encounter (Signed)
Left message to call back  

## 2016-01-28 NOTE — Telephone Encounter (Signed)
Scheduled patient Monday, July 3rd with Richardson Dopp to review results in more detail and to set up treatment plan.  ECHO not yet MD reviewed.

## 2016-01-28 NOTE — Telephone Encounter (Signed)
-----   Message from Sueanne Margarita, MD sent at 01/26/2016  6:06 PM EDT ----- EF has declined since echo a year ago - since she is having CP with a history of CAD and declining EF - please have her come in to talk with extender about left heart cath.

## 2016-01-28 NOTE — Telephone Encounter (Signed)
01-28-16 pt called back @ 202pm-pls call

## 2016-01-28 NOTE — Telephone Encounter (Signed)
Follow-up ° ° ° ° °The pt is returning the nurses call °

## 2016-01-30 NOTE — Progress Notes (Signed)
Cardiology Office Note:    Date:  01/31/2016   ID:  Amy Chaney, DOB 01/30/1971, MRN [id number]  PCP:  Minerva Ends, MD  Cardiologist:  Dr. Fransico Him   Electrophysiologist:  n/a  Referring MD: Boykin Nearing, MD   Chief Complaint  Patient presents with  . Follow-up    chest pain, abnormal myoview    History of Present Illness:     Amy Chaney is a 45 y.o. 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 Previously followed by Dr. Einar Chaney.  Recently established with Dr. Fransico Him 01/11/16.  Blood pressure has been poorly controlled in the past secondary to dietary noncompliance and obesity. When she saw Dr. Radford Pax, she complained of chest pain for the past few months. Chest pain was somewhat consistent with previous angina. Stress Myoview and echocardiogram were both arranged. Myoview demonstrated no scar or ischemia but EF was 38%. Dr. Radford Pax suggested proceeding with cardiac catheterization. Follow-up echocardiogram subsequently demonstrated normal EF at 20-80% and mild diastolic dysfunction. The patient returns for follow-up.   She returns for follow-up. She is here alone today. She continues to note episodes of chest discomfort with exertion. This typically happens at work. She works at [employer] and does a lot of heavy lifting during her shift. Symptoms usually resolve with rest. She does note dyspnea with exertion as well. Overall, her symptoms have remained stable. Her chest discomfort is not quite like the discomfort she had prior to her PCI. She denies syncope. She denies orthopnea, PND or edema. She continues to smoke.  Past Medical History  Diagnosis Date  . Hypertension   . Coronary artery disease     a. s/p PCI of the mid RCA 01/2013 //  b. LHC 9/14: EF 55%, distal segment of  RCA stent occluded, LAD irregularities >>  subacute stent thrombosis, PCI: 3 x 20 mm Promus premier DES to mid RCA //  c. Myoview 6/17: Intermediate risk, no scar or ischemia, EF 38%, hypertensive blood pressure response (231/132), findings consistent with dilated nonischemic cardiomyopathy   . Dyslipidemia, goal LDL below 70   . Daily headache   . Depression   . Type II diabetes mellitus ([place]) 12/2010    sister and son also diabetic   . Prolonged Q-T interval on ECG 01/11/2016  . Tobacco abuse 01/11/2016  . History of echocardiogram     a. Echo 6/17: mild LVH, EF 55-60%, no RWMA, Gr 1 DD, mild MR  . History of Doppler ultrasound     carotid bruit >> a. Carotid US 6/17: bilat ICA 1-39%    Past Surgical History  Procedure Laterality Date  . Coronary angioplasty with stent placement  02/25/2013    "2" (02/25/2013)  . Cholecystectomy  ~ 2000  . Tubal ligation  1994  . Left heart catheterization with coronary angiogram N/A 02/25/2013    Procedure: LEFT HEART CATHETERIZATION WITH CORONARY ANGIOGRAM;  Surgeon: Laverda Page, MD;  Location: [facility] CATH LAB;  Service: Cardiovascular;  Laterality: N/A;  . Left heart catheterization with coronary angiogram N/A 04/01/2013    Procedure: LEFT HEART CATHETERIZATION WITH CORONARY ANGIOGRAM;  Surgeon: Laverda Page, MD;  Location: [facility] CATH LAB;  Service: Cardiovascular;  Laterality: N/A;  . Percutaneous coronary stent intervention (pci-s)  04/01/2013  Procedure: PERCUTANEOUS CORONARY STENT INTERVENTION (PCI-S);  Surgeon: Laverda Page, MD;  Location: [facility] CATH LAB;  Service: Cardiovascular;;    Current Medications: Outpatient Prescriptions Prior to Visit  Medication Sig Dispense Refill  . aspirin EC 81 MG tablet Take 1 tablet (81 mg total) by mouth daily. 90 tablet 3  . ferrous sulfate 325 (65 FE) MG tablet Take 325 mg by mouth daily with breakfast. Reported on 11/25/2015    . Insulin Pen Needle (B-D ULTRAFINE III SHORT PEN) 31G X 8 MM MISC 1  application by Does not apply route daily. 100 each 3  . Liraglutide 18 MG/3ML SOPN 0.6 mg daily for one week, then 1.2 mg daily 6 mL 3  . metFORMIN (GLUCOPHAGE-XR) 500 MG 24 hr tablet Take 2 tablets (1,000 mg total) by mouth daily with breakfast. 180 tablet 3  . TRUEPLUS LANCETS 28G MISC 1 each by Other route 3 (three) times daily. 102 each 12  . atorvastatin (LIPITOR) 10 MG tablet Take 1 tablet (10 mg total) by mouth daily. 90 tablet 3  . lisinopril (PRINIVIL,ZESTRIL) 10 MG tablet Take 1 tablet (10 mg total) by mouth daily. 90 tablet 3  . metoprolol tartrate (LOPRESSOR) 25 MG tablet Take 1 tablet (25 mg total) by mouth 2 (two) times daily. 180 tablet 3  . nitroGLYCERIN (NITROSTAT) 0.4 MG SL tablet Place 1 tablet (0.4 mg total) under the tongue every 5 (five) minutes as needed for chest pain. Reported on 11/25/2015 25 tablet 3  . Blood Glucose Monitoring Suppl (TRUE METRIX METER) w/Device KIT 1 each by Does not apply route 3 (three) times daily. (Patient not taking: Reported on 01/31/2016) 1 kit 0   No facility-administered medications prior to visit.      Allergies:   Review of patient's allergies indicates no known allergies.   Social History   Social History  . Marital Status: Single    Spouse Name: N/A  . Number of Children: N/A  . Years of Education: N/A   Social History Main Topics  . Smoking status: Current Every Day Smoker -- 0.50 packs/day for 15 years    Types: Cigarettes  . Smokeless tobacco: Never Used     Comment: states she has cut back to 56 cigarettes/day   . Alcohol Use: 0.0 oz/week    0 Standard drinks or equivalent per week     Comment: 02/25/2013 "only on special occasions; maybe once/yr"  . Drug Use: Yes    Special: Marijuana     Comment: 02/25/2013 "quit marijuana ~ 2001"  . Sexual Activity: Not Currently    Birth Control/ Protection: Surgical   Other Topics Concern  . None   Social History Narrative     Family History:  The patient's family history  includes Diabetes in her mother, sister, sister, son, and son.   ROS:   Please see the history of present illness.    Review of Systems  Musculoskeletal: Positive for back pain.   All other systems reviewed and are negative.   Physical Exam:    VS:  BP 144/100 mmHg  Pulse 58  Ht _0  (1.753 m)  Wt 239 lb 12.8 oz (108.773 kg)  BMI 35.40 kg/m2   Physical Exam  Constitutional: She is oriented to person, place, and time. She appears well-developed and well-nourished.  HENT:  Head: Normocephalic and atraumatic.  Neck: No JVD present.  Cardiovascular: Normal rate, regular rhythm and normal heart sounds.   No murmur heard. Pulmonary/Chest: Effort normal and breath  sounds normal. She has no wheezes. She has no rales.  Abdominal: Soft. There is no tenderness.  Musculoskeletal: She exhibits no edema.  Neurological: She is alert and oriented to person, place, and time.  Skin: Skin is warm and dry.  Psychiatric: She has a normal mood and affect.    Wt Readings from Last 3 Encounters:  01/31/16 239 lb 12.8 oz (108.773 kg)  01/11/16 240 lb (108.863 kg)  11/25/15 239 lb (108.41 kg)      Studies/Labs Reviewed:     EKG:  EKG is  ordered today.  The ekg ordered today demonstrates Sinus bradycardia, HR 58, normal axis, QTc 475 ms, no change from prior tracing  Recent Labs: 11/05/2015: BUN 8; Creatinine, Ser 0.56; Hemoglobin 11.9*; Platelets 313; Potassium 3.8; Sodium 137 01/27/2016: ALT 9   Recent Lipid Panel    Component Value Date/Time   CHOL 177 01/27/2016 0810   TRIG 125 01/27/2016 0810   HDL 47 01/27/2016 0810   CHOLHDL 3.8 01/27/2016 0810   VLDL 25 01/27/2016 0810   LDLCALC 105 01/27/2016 0810    Additional studies/ records that were reviewed today include:   Carotid US 01/27/16 Stable 1-39% bilateral ICA stenosis >> FU prn  Echo 01/27/16 - Left ventricle: The cavity size was normal. Wall thickness was   increased in a pattern of mild LVH. Systolic function was  normal.   The estimated ejection fraction was in the range of 55% to 60%.   Wall motion was normal; there were no regional wall motion   abnormalities. Doppler parameters are consistent with abnormal   left ventricular relaxation (grade 1 diastolic dysfunction). - Mitral valve: There was mild regurgitation.  Myoview 01/26/16 This is an intermediate risk study with LV dilatation and no evidence of prior infarct or ischemia. LVEF is 38% with diffuse hypokinesis. 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 Hypertensive response to stress with hypertension 231/132 mmHg. Decreased exercise capacity.  LHC 04/01/13 Left ventricle: Performed in the RAO projection revealed LVEF of 55. No wall motion abnormality noted.  Right coronary artery: It's a large vessel, previously placed stent in the proximal segment where patent, however distal segment was occluded. No collaterals were evident from the left system.  Left main coronary artery is large and normal.  Circumflex coronary artery: A large vessel giving origin to a large high obtuse marginal 1. It also gives origin to a large OM 2 and continues in the AV groove, smooth and normal.  LAD: LAD gives origin to a large diagonal-1 and D2. LAD has very mild luminal irregularities.  Impression:  subacute stent thrombosis involving the midsegment of the right coronary artery stents that was previously placed, history of PTCA and stenting of CTO of the RCA on 02/25/2013 with implantation of a long 3.0 x 38 mm overlap with a 3.5 x 16 mm promos drug-eluting stent Interventional data: Successful PTCA and stenting of the  mid RCA  with implantation of a  3.0 x 20 mm promos Premier DES. Stenosis reduced from 100% to 0% with brisk TIMI 0 to TIMI 3 flow at the end of the procedure. Patient will  need Dual antiplatelet therapy with Effinet and ASA 81 mg for at least One year.     ASSESSMENT:     1.  Atherosclerosis of native coronary artery of native heart with angina pectoris ([place])   2. Essential hypertension   3. Hypercholesterolemia   4. Bilateral carotid artery disease ([place])  5. Smoker   6. Heart palpitations     PLAN:     In order of problems listed above:  1. CAD - The patient has a history of CAD status post prior DES to the RCA with repeat DES to the RCA secondary to subacute stent thrombosis. She has had recent chest discomfort and shortness of breath. Her stress test demonstrated reduced ejection fraction but no scar or ischemia. Her echocardiogram demonstrated normal LV function. Initially, recommendation was to proceed with cardiac catheterization given abnormal EF on stress testing.  I reviewed all this with Dr. Dorris Carnes (DOD) today. Given her stable symptoms and normal EF on Echo, we felt it was best to advance medical Rx for HTN and CAD before committing to cardiac cath.    -  Continue aspirin, beta blocker, statin  -  Add amlodipine 5 mg daily  -  Follow-up with Dr. Radford Pax in one month. If symptoms continue or progress, consider cardiac catheterization.  2. HTN - Uncontrolled. Marked blood pressure elevation during stress test. Continue current dose of lisinopril and metoprolol. Add amlodipine 5 mg daily for BP and to cover for angina.  3. HL - Continue statin.  4. Carotid artery disease - Recent Doppler with minimal disease. Follow-up when necessary recommended. Continue aspirin, statin.  5. Tobacco abuse - We again discussed the importance of tobacco cessation.  6. Palpitations - Event monitor pending.   Medication Adjustments/Labs and Tests Ordered: Current medicines are reviewed at length with the patient today.  Concerns regarding medicines are outlined above.  Medication changes, Labs and Tests ordered today are outlined in the Patient Instructions noted below. Patient Instructions  Medication Instructions:  1. START AMLODIPINE 5 MG  DAILY Labwork: NONE Testing/Procedures: NONE Follow-Up: DR. Radford Pax IN 1 MONTH Any Other Special Instructions Will Be Listed Below (If Applicable). If you need a refill on your cardiac medications before your next appointment, please call your pharmacy.   Signed, Richardson Dopp, PA-C  01/31/2016 10:12 AM    [address], [place], [place]  [address] Phone: [phone]; Fax: [phone]

## 2016-01-31 ENCOUNTER — Encounter: Payer: Self-pay | Admitting: Physician Assistant

## 2016-01-31 ENCOUNTER — Encounter (INDEPENDENT_AMBULATORY_CARE_PROVIDER_SITE_OTHER): Payer: Self-pay

## 2016-01-31 ENCOUNTER — Ambulatory Visit (INDEPENDENT_AMBULATORY_CARE_PROVIDER_SITE_OTHER): Payer: BLUE CROSS/BLUE SHIELD | Admitting: Physician Assistant

## 2016-01-31 VITALS — BP 144/100 | HR 58 | Ht 69.0 in | Wt 239.8 lb

## 2016-01-31 DIAGNOSIS — I779 Disorder of arteries and arterioles, unspecified: Secondary | ICD-10-CM

## 2016-01-31 DIAGNOSIS — I739 Peripheral vascular disease, unspecified: Secondary | ICD-10-CM

## 2016-01-31 DIAGNOSIS — F17200 Nicotine dependence, unspecified, uncomplicated: Secondary | ICD-10-CM

## 2016-01-31 DIAGNOSIS — I25119 Atherosclerotic heart disease of native coronary artery with unspecified angina pectoris: Secondary | ICD-10-CM

## 2016-01-31 DIAGNOSIS — E7800 Pure hypercholesterolemia, unspecified: Secondary | ICD-10-CM | POA: Diagnosis not present

## 2016-01-31 DIAGNOSIS — I10 Essential (primary) hypertension: Secondary | ICD-10-CM

## 2016-01-31 DIAGNOSIS — Z720 Tobacco use: Secondary | ICD-10-CM

## 2016-01-31 DIAGNOSIS — R002 Palpitations: Secondary | ICD-10-CM

## 2016-01-31 MED ORDER — LISINOPRIL 10 MG PO TABS: 10.0000 mg | ORAL_TABLET | Freq: Every day | ORAL | Status: DC

## 2016-01-31 MED ORDER — AMLODIPINE BESYLATE 5 MG PO TABS: 5.0000 mg | ORAL_TABLET | Freq: Every day | ORAL | Status: DC

## 2016-01-31 MED ORDER — NITROGLYCERIN 0.4 MG SL SUBL: 0.4000 mg | SUBLINGUAL_TABLET | SUBLINGUAL | Status: DC | PRN

## 2016-01-31 MED ORDER — METOPROLOL TARTRATE 25 MG PO TABS: 25.0000 mg | ORAL_TABLET | Freq: Two times a day (BID) | ORAL | Status: DC

## 2016-01-31 MED ORDER — ATORVASTATIN CALCIUM 10 MG PO TABS: 10.0000 mg | ORAL_TABLET | Freq: Every day | ORAL | Status: DC

## 2016-01-31 MED FILL — ?METOPROLOL 25 MG TABLET: 25 | 30 days supply | Qty: 60 | Fill #0

## 2016-01-31 MED FILL — NITROSTAT 0.4 MG TABLET SL: 0.4 | 25 days supply | Qty: 25 | Fill #0

## 2016-01-31 MED FILL — AMLODIPINE BESYLATE 5 MG TA: 5 | 30 days supply | Qty: 30 | Fill #0

## 2016-01-31 MED FILL — ?LISINOPRIL 10 MG TABLET: 10 | 30 days supply | Qty: 30 | Fill #0

## 2016-01-31 NOTE — Patient Instructions (Addendum)
Medication Instructions:  1. START AMLODIPINE 5 MG DAILY Labwork: NONE Testing/Procedures: NONE Follow-Up: DR. Radford Pax IN 1 MONTH Any Other Special Instructions Will Be Listed Below (If Applicable). If you need a refill on your cardiac medications before your next appointment, please call your pharmacy.

## 2016-02-04 ENCOUNTER — Telehealth: Payer: Self-pay

## 2016-02-04 DIAGNOSIS — I25119 Atherosclerotic heart disease of native coronary artery with unspecified angina pectoris: Secondary | ICD-10-CM

## 2016-02-04 DIAGNOSIS — I779 Disorder of arteries and arterioles, unspecified: Secondary | ICD-10-CM

## 2016-02-04 DIAGNOSIS — E7800 Pure hypercholesterolemia, unspecified: Secondary | ICD-10-CM

## 2016-02-04 DIAGNOSIS — I739 Peripheral vascular disease, unspecified: Secondary | ICD-10-CM

## 2016-02-04 MED ORDER — ATORVASTATIN CALCIUM 20 MG PO TABS: 20.0000 mg | ORAL_TABLET | Freq: Every day | ORAL | Status: DC

## 2016-02-04 MED FILL — ?ATORVASTATIN 20 MG TABLET: 20 | 30 days supply | Qty: 30 | Fill #0

## 2016-02-04 NOTE — Telephone Encounter (Signed)
Informed patient of lab, carotid, and ECHO results and verbal understanding expressed.   Instructed patient to INCREASE LIPITOR to 20 mg daily. She will have repeat FLP and LFTs 9/5 at time of yearly OV. Repeat carotids ordered to be scheduled in 2 years. Patient agrees with treatment plan.

## 2016-02-04 NOTE — Telephone Encounter (Signed)
-----   Message from Sueanne Margarita, MD sent at 02/03/2016  8:08 AM EDT ----- Echo showed normal LVF with mild MR

## 2016-02-04 NOTE — Telephone Encounter (Signed)
-----   Message from Sueanne Margarita, MD sent at 02/02/2016  9:57 PM EDT ----- 1-39% bilateral carotid artery stenosis - repeat study in 2 year

## 2016-02-04 NOTE — Telephone Encounter (Signed)
-----   Message from Sueanne Margarita, MD sent at 02/02/2016  9:53 PM EDT ----- LDL not at goal - increase Lipitor to 20mg  daily and repeat FLP and ALT in 6 weeks

## 2016-03-06 ENCOUNTER — Ambulatory Visit: Payer: BLUE CROSS/BLUE SHIELD | Admitting: Cardiology

## 2016-03-07 ENCOUNTER — Encounter: Payer: Self-pay | Admitting: Cardiology

## 2016-03-07 NOTE — Progress Notes (Deleted)
Cardiology Office Note   Date:  03/07/2016   ID:  Amy Chaney, DOB 1970-12-07, MRN [id number]  PCP:  Amy Ends, MD  Cardiologist:  Dr. Radford Pax    No chief complaint on file.     History of Present Illness: Amy Chaney is a 45 y.o. female who presents for follow up with chest pain after addition of amlodpine   hx of CAD status post PCI of the RCA with DES in 7/14 and repeat PCI of the mid RCA secondary to subacute in-stent thrombosis treated with DES, combined systolic and diastolic CHF with prior EF 45-50%, diabetes, HTN, HL. Previously followed by Dr. Einar Chaney.  Recently established with Dr. Fransico Chaney 01/11/16.  Blood pressure has been poorly controlled in the past secondary to dietary noncompliance and obesity. When she saw Dr. Radford Pax, she complained of chest pain for the past few months. Chest pain was somewhat consistent with previous angina. Stress Myoview and echocardiogram were both arranged. Myoview demonstrated no scar or ischemia but EF was 38%. Dr. Radford Pax suggested proceeding with cardiac catheterization. Follow-up echocardiogram subsequently demonstrated normal EF at 58-52% and mild diastolic dysfunction. The patient returns for follow-up.    Amlodipine 5 mg added and is back for follow up.  ***  Past Medical History:  Diagnosis Date  . Coronary artery disease    a. s/p PCI of the mid RCA 01/2013 //  b. LHC 9/14: EF 55%, distal segment of RCA stent occluded, LAD irregularities >>  subacute stent thrombosis, PCI: 3 x 20 mm Promus premier DES to mid RCA //  c. Myoview 6/17: Intermediate risk, no scar or ischemia, EF 38%, hypertensive blood pressure response (231/132), findings consistent with dilated nonischemic cardiomyopathy   . Daily headache   . Depression   . Dyslipidemia, goal LDL below 70   . History of Doppler ultrasound    carotid bruit >> a. Carotid US 6/17: bilat ICA 1-39%  . History of echocardiogram    a. Echo 6/17: mild LVH, EF 55-60%, no RWMA,  Gr 1 DD, mild MR  . Hypertension   . Prolonged Q-T interval on ECG 01/11/2016  . Tobacco abuse 01/11/2016  . Type II diabetes mellitus ([place]) 12/2010   sister and son also diabetic     Past Surgical History:  Procedure Laterality Date  . CHOLECYSTECTOMY  ~ 2000  . CORONARY ANGIOPLASTY WITH STENT PLACEMENT  02/25/2013   "2" (02/25/2013)  . LEFT HEART CATHETERIZATION WITH CORONARY ANGIOGRAM N/A 02/25/2013   Procedure: LEFT HEART CATHETERIZATION WITH CORONARY ANGIOGRAM;  Surgeon: Laverda Page, MD;  Location[facility] CATH LAB;  Service: Cardiovascular;  Laterality: N/A;  . LEFT HEART CATHETERIZATION WITH CORONARY ANGIOGRAM N/A 04/01/2013   Procedure: LEFT HEART CATHETERIZATION WITH CORONARY ANGIOGRAM;  Surgeon: Laverda Page, MD;  Location: [facility] CATH LAB;  Service: Cardiovascular;  Laterality: N/A;  . PERCUTANEOUS CORONARY STENT INTERVENTION (PCI-S)  04/01/2013   Procedure: PERCUTANEOUS CORONARY STENT INTERVENTION (PCI-S);  Surgeon: Laverda Page, MD;  Location: [facility] CATH LAB;  Service: Cardiovascular;;  . TUBAL LIGATION  1994     Current Outpatient Prescriptions  Medication Sig Dispense Refill  . amLODipine (NORVASC) 5 MG tablet Take 1 tablet (5 mg total) by mouth daily. 90 tablet 3  . aspirin EC 81 MG tablet Take 1 tablet (81 mg total) by mouth daily. 90 tablet 3  . atorvastatin (LIPITOR) 20 MG tablet Take 1 tablet (20 mg total) by mouth daily. 90 tablet 3  . Blood Glucose  Monitoring Suppl (TRUE METRIX METER) w/Device KIT 1 each by Does not apply route 3 (three) times daily. (Patient not taking: Reported on 01/31/2016) 1 kit 0  . ferrous sulfate 325 (65 FE) MG tablet Take 325 mg by mouth daily with breakfast. Reported on 11/25/2015    . Insulin Pen Needle (B-D ULTRAFINE III SHORT PEN) 31G X 8 MM MISC 1 application by Does not apply route daily. 100 each 3  . Liraglutide 18 MG/3ML SOPN 0.6 mg daily for one week, then 1.2 mg daily 6 mL 3  . lisinopril (PRINIVIL,ZESTRIL) 10 MG tablet Take 1 tablet  (10 mg total) by mouth daily. 90 tablet 3  . metFORMIN (GLUCOPHAGE-XR) 500 MG 24 hr tablet Take 2 tablets (1,000 mg total) by mouth daily with breakfast. 180 tablet 3  . metoprolol tartrate (LOPRESSOR) 25 MG tablet Take 1 tablet (25 mg total) by mouth 2 (two) times daily. 180 tablet 3  . nitroGLYCERIN (NITROSTAT) 0.4 MG SL tablet Place 1 tablet (0.4 mg total) under the tongue every 5 (five) minutes as needed for chest pain. Reported on 11/25/2015 25 tablet 3  . TRUEPLUS LANCETS 28G MISC 1 each by Other route 3 (three) times daily. 102 each 12   No current facility-administered medications for this visit.     Allergies:   Review of patient's allergies indicates no known allergies.    Social History:  The patient  reports that she has been smoking Cigarettes.  She has a 7.50 pack-year smoking history. She has never used smokeless tobacco. She reports that she drinks alcohol. She reports that she uses drugs, including Marijuana.   Family History:  The patient's ***family history includes Diabetes in her mother, sister, sister, son, and son.    ROS:  General:no colds or fevers, no weight changes Skin:no rashes or ulcers HEENT:no blurred vision, no congestion CV:see HPI PUL:see HPI GI:no diarrhea constipation or melena, no indigestion GU:no hematuria, no dysuria MS:no joint pain, no claudication Neuro:no syncope, no lightheadedness Endo:no diabetes, no thyroid disease Wt Readings from Last 3 Encounters:  01/31/16 239 lb 12.8 oz (108.8 kg)  01/11/16 240 lb (108.9 kg)  11/25/15 239 lb (108.4 kg)     PHYSICAL EXAM: VS:  There were no vitals taken for this visit. , BMI There is no height or weight on file to calculate BMI. General:Pleasant affect, NAD Skin:Warm and dry, brisk capillary refill HEENT:normocephalic, sclera clear, mucus membranes moist Neck:supple, no JVD, no bruits  Heart:S1S2 RRR without murmur, gallup, rub or click Lungs:clear without rales, rhonchi, or  wheezes XLK:GMWN, non tender, + BS, do not palpate liver spleen or masses Ext:no lower ext edema, 2+ pedal pulses, 2+ radial pulses Neuro:alert and oriented, MAE, follows commands, + facial symmetry    EKG:  EKG is ordered today. The ekg ordered today demonstrates ***   Recent Labs: 11/05/2015: BUN 8; Creatinine, Ser 0.56; Hemoglobin 11.9; Platelets 313; Potassium 3.8; Sodium 137 01/27/2016: ALT 9    Lipid Panel    Component Value Date/Time   CHOL 177 01/27/2016 0810   TRIG 125 01/27/2016 0810   HDL 47 01/27/2016 0810   CHOLHDL 3.8 01/27/2016 0810   VLDL 25 01/27/2016 0810   LDLCALC 105 01/27/2016 0810       Other studies Reviewed: Additional studies/ records that were reviewed today include: ***.   ASSESSMENT AND PLAN:  1.  ***  1. CAD - The patient has a history of CAD status post prior DES to the RCA with repeat DES  to the RCA secondary to subacute stent thrombosis. She has had recent chest discomfort and shortness of breath. Her stress test demonstrated reduced ejection fraction but no scar or ischemia. Her echocardiogram demonstrated normal LV function. Initially, recommendation was to proceed with cardiac catheterization given abnormal EF on stress testing.  I reviewed all this with Dr. Dorris Carnes (DOD) today. Given her stable symptoms and normal EF on Echo, we felt it was best to advance medical Rx for HTN and CAD before committing to cardiac cath.                         -  Continue aspirin, beta blocker, statin                       -  Add amlodipine 5 mg daily                       -  Follow-up with Dr. Radford Pax in one month. If symptoms continue or progress, consider cardiac catheterization.  2. HTN - Uncontrolled. Marked blood pressure elevation during stress test. Continue current dose of lisinopril and metoprolol. Add amlodipine 5 mg daily for BP and to cover for angina.  3. HL - Continue statin.  4. Carotid artery disease - Recent Doppler with minimal  disease. Follow-up when necessary recommended. Continue aspirin, statin.  5. Tobacco abuse - We again discussed the importance of tobacco cessation.  6. Palpitations - Event monitor pending.   Current medicines are reviewed with the patient today.  The patient Has no concerns regarding medicines.  The following changes have been made:  See above Labs/ tests ordered today include:see above  Disposition:   FU:  see above  Signed, Cecilie Kicks, NP  03/07/2016 11:01 PM    [facility], [place], [place], [place] Phone: [phone]; Fax: [phone]

## 2016-03-08 ENCOUNTER — Ambulatory Visit: Payer: BLUE CROSS/BLUE SHIELD | Admitting: Cardiology

## 2016-04-04 ENCOUNTER — Ambulatory Visit: Payer: BLUE CROSS/BLUE SHIELD | Admitting: Cardiology

## 2016-04-06 ENCOUNTER — Encounter: Payer: Self-pay | Admitting: Cardiology

## 2017-09-28 DIAGNOSIS — I2102 ST elevation (STEMI) myocardial infarction involving left anterior descending coronary artery: Secondary | ICD-10-CM

## 2017-09-28 HISTORY — PX: TRANSTHORACIC ECHOCARDIOGRAM: SHX275

## 2017-09-28 HISTORY — DX: ST elevation (STEMI) myocardial infarction involving left anterior descending coronary artery: I21.02

## 2017-10-01 ENCOUNTER — Encounter (HOSPITAL_COMMUNITY): Payer: Self-pay

## 2017-10-01 ENCOUNTER — Other Ambulatory Visit: Payer: Self-pay

## 2017-10-01 ENCOUNTER — Emergency Department (HOSPITAL_COMMUNITY): Payer: BLUE CROSS/BLUE SHIELD

## 2017-10-01 ENCOUNTER — Emergency Department (HOSPITAL_COMMUNITY)
Admission: EM | Admit: 2017-10-01 | Discharge: 2017-10-01 | Disposition: A | Discharge status: Home / Self Care | Payer: BLUE CROSS/BLUE SHIELD | Attending: Emergency Medicine | Admitting: Emergency Medicine

## 2017-10-01 DIAGNOSIS — Z5321 Procedure and treatment not carried out due to patient leaving prior to being seen by health care provider: Secondary | ICD-10-CM | POA: Insufficient documentation

## 2017-10-01 DIAGNOSIS — R079 Chest pain, unspecified: Secondary | ICD-10-CM | POA: Insufficient documentation

## 2017-10-01 LAB — CBC
HEMATOCRIT: 38.4 % (ref 36.0–46.0)
HEMOGLOBIN: 12.5 g/dL (ref 12.0–15.0)
MCH: 26.7 pg (ref 26.0–34.0)
MCHC: 32.6 g/dL (ref 30.0–36.0)
MCV: 81.9 fL (ref 78.0–100.0)
Platelets: 327 10*3/uL (ref 150–400)
RBC: 4.69 MIL/uL (ref 3.87–5.11)
RDW: 14 % (ref 11.5–15.5)
WBC: 10.5 10*3/uL (ref 4.0–10.5)

## 2017-10-01 LAB — BASIC METABOLIC PANEL
ANION GAP: 11 (ref 5–15)
BUN: 13 mg/dL (ref 6–20)
CHLORIDE: 105 mmol/L (ref 101–111)
CO2: 20 mmol/L — AB (ref 22–32)
Calcium: 8.9 mg/dL (ref 8.9–10.3)
Creatinine, Ser: 0.88 mg/dL (ref 0.44–1.00)
GFR calc non Af Amer: >60 mL/min (ref >60)
GLUCOSE: 258 mg/dL — AB (ref 65–99)
POTASSIUM: 3.9 mmol/L (ref 3.5–5.1)
Sodium: 136 mmol/L (ref 135–145)

## 2017-10-01 LAB — I-STAT BETA HCG BLOOD, ED (MC, WL, AP ONLY): I-stat hCG, quantitative: 6.5 m[IU]/mL — ABNORMAL HIGH (ref <5)

## 2017-10-01 LAB — I-STAT TROPONIN, ED: TROPONIN I, POC: 0.01 ng/mL (ref 0.00–0.08)

## 2017-10-01 NOTE — ED Triage Notes (Signed)
Patient complains of CP with radiation to back since am. States that the pain is worse with inspiration and movement. Denies any recent URI symptoms. Alert and oiented

## 2017-10-01 NOTE — ED Notes (Signed)
Second call in lobby. No response. RN notified

## 2017-10-01 NOTE — ED Notes (Signed)
Pt called in lobby for vitals update. No response

## 2017-10-01 NOTE — ED Notes (Signed)
No answer when patient's name called.

## 2017-10-09 ENCOUNTER — Encounter (HOSPITAL_COMMUNITY): Payer: Self-pay | Admitting: Emergency Medicine

## 2017-10-09 ENCOUNTER — Inpatient Hospital Stay (HOSPITAL_COMMUNITY)
Admission: EM | Admit: 2017-10-09 | Discharge: 2017-10-14 | DRG: 246 | Disposition: A | Discharge status: Home / Self Care | Payer: Self-pay | Attending: Internal Medicine | Admitting: Internal Medicine

## 2017-10-09 ENCOUNTER — Other Ambulatory Visit: Payer: Self-pay

## 2017-10-09 DIAGNOSIS — E876 Hypokalemia: Secondary | ICD-10-CM | POA: Diagnosis not present

## 2017-10-09 DIAGNOSIS — E1169 Type 2 diabetes mellitus with other specified complication: Secondary | ICD-10-CM | POA: Diagnosis present

## 2017-10-09 DIAGNOSIS — I213 ST elevation (STEMI) myocardial infarction of unspecified site: Secondary | ICD-10-CM

## 2017-10-09 DIAGNOSIS — I2510 Atherosclerotic heart disease of native coronary artery without angina pectoris: Secondary | ICD-10-CM | POA: Diagnosis present

## 2017-10-09 DIAGNOSIS — Z9114 Patient's other noncompliance with medication regimen: Secondary | ICD-10-CM

## 2017-10-09 DIAGNOSIS — I10 Essential (primary) hypertension: Secondary | ICD-10-CM

## 2017-10-09 DIAGNOSIS — E7800 Pure hypercholesterolemia, unspecified: Secondary | ICD-10-CM

## 2017-10-09 DIAGNOSIS — Z6836 Body mass index (BMI) 36.0-36.9, adult: Secondary | ICD-10-CM

## 2017-10-09 DIAGNOSIS — I214 Non-ST elevation (NSTEMI) myocardial infarction: Secondary | ICD-10-CM

## 2017-10-09 DIAGNOSIS — I739 Peripheral vascular disease, unspecified: Secondary | ICD-10-CM

## 2017-10-09 DIAGNOSIS — I252 Old myocardial infarction: Secondary | ICD-10-CM

## 2017-10-09 DIAGNOSIS — F17200 Nicotine dependence, unspecified, uncomplicated: Secondary | ICD-10-CM | POA: Diagnosis present

## 2017-10-09 DIAGNOSIS — I110 Hypertensive heart disease with heart failure: Secondary | ICD-10-CM | POA: Diagnosis present

## 2017-10-09 DIAGNOSIS — I779 Disorder of arteries and arterioles, unspecified: Secondary | ICD-10-CM | POA: Diagnosis present

## 2017-10-09 DIAGNOSIS — F17210 Nicotine dependence, cigarettes, uncomplicated: Secondary | ICD-10-CM | POA: Diagnosis present

## 2017-10-09 DIAGNOSIS — I25110 Atherosclerotic heart disease of native coronary artery with unstable angina pectoris: Secondary | ICD-10-CM | POA: Diagnosis present

## 2017-10-09 DIAGNOSIS — E1165 Type 2 diabetes mellitus with hyperglycemia: Secondary | ICD-10-CM | POA: Diagnosis present

## 2017-10-09 DIAGNOSIS — E785 Hyperlipidemia, unspecified: Secondary | ICD-10-CM | POA: Diagnosis present

## 2017-10-09 DIAGNOSIS — I25119 Atherosclerotic heart disease of native coronary artery with unspecified angina pectoris: Secondary | ICD-10-CM | POA: Diagnosis present

## 2017-10-09 DIAGNOSIS — Z794 Long term (current) use of insulin: Secondary | ICD-10-CM

## 2017-10-09 DIAGNOSIS — IMO0002 Reserved for concepts with insufficient information to code with codable children: Secondary | ICD-10-CM | POA: Diagnosis present

## 2017-10-09 DIAGNOSIS — I5042 Chronic combined systolic (congestive) and diastolic (congestive) heart failure: Secondary | ICD-10-CM | POA: Diagnosis present

## 2017-10-09 DIAGNOSIS — Z955 Presence of coronary angioplasty implant and graft: Secondary | ICD-10-CM

## 2017-10-09 DIAGNOSIS — Z7982 Long term (current) use of aspirin: Secondary | ICD-10-CM

## 2017-10-09 DIAGNOSIS — I2102 ST elevation (STEMI) myocardial infarction involving left anterior descending coronary artery: Secondary | ICD-10-CM

## 2017-10-09 DIAGNOSIS — I5032 Chronic diastolic (congestive) heart failure: Secondary | ICD-10-CM | POA: Diagnosis present

## 2017-10-09 DIAGNOSIS — I2119 ST elevation (STEMI) myocardial infarction involving other coronary artery of inferior wall: Principal | ICD-10-CM | POA: Diagnosis present

## 2017-10-09 DIAGNOSIS — Z91199 Patient's noncompliance with other medical treatment and regimen due to unspecified reason: Secondary | ICD-10-CM

## 2017-10-09 DIAGNOSIS — E119 Type 2 diabetes mellitus without complications: Secondary | ICD-10-CM | POA: Diagnosis present

## 2017-10-09 DIAGNOSIS — Z79899 Other long term (current) drug therapy: Secondary | ICD-10-CM

## 2017-10-09 DIAGNOSIS — Z9119 Patient's noncompliance with other medical treatment and regimen: Secondary | ICD-10-CM

## 2017-10-09 NOTE — ED Triage Notes (Signed)
Pt BIB EMS from home. Reports sudden onset of coughing fit with single episode of emesis. CP and SOB afterward. Noted some yellow sputum. Denies fever, chills, body aches. 85% on RA for Fire. EMS attempted neb tx, but pt reported it incr'd her pain & SOB. Placed on NRB @ 15Lpm. Given 324mg  ASA, 1 nitro by EMS, which helped her pain. Hx CHF, HTN but not taking her meds at this time.

## 2017-10-09 NOTE — ED Notes (Signed)
Roxanne Mins calling a Code STEMI

## 2017-10-10 ENCOUNTER — Inpatient Hospital Stay (HOSPITAL_COMMUNITY)
Admission: EM | Disposition: A | Discharge status: Home / Self Care | Payer: Self-pay | Source: Home / Self Care | Attending: Internal Medicine

## 2017-10-10 ENCOUNTER — Encounter (HOSPITAL_COMMUNITY): Payer: Self-pay | Admitting: Cardiology

## 2017-10-10 ENCOUNTER — Other Ambulatory Visit: Payer: Self-pay

## 2017-10-10 ENCOUNTER — Emergency Department (HOSPITAL_COMMUNITY): Payer: Self-pay

## 2017-10-10 ENCOUNTER — Other Ambulatory Visit (HOSPITAL_COMMUNITY): Payer: Self-pay

## 2017-10-10 DIAGNOSIS — I2119 ST elevation (STEMI) myocardial infarction involving other coronary artery of inferior wall: Principal | ICD-10-CM

## 2017-10-10 DIAGNOSIS — I10 Essential (primary) hypertension: Secondary | ICD-10-CM

## 2017-10-10 DIAGNOSIS — I25110 Atherosclerotic heart disease of native coronary artery with unstable angina pectoris: Secondary | ICD-10-CM

## 2017-10-10 DIAGNOSIS — I214 Non-ST elevation (NSTEMI) myocardial infarction: Secondary | ICD-10-CM

## 2017-10-10 DIAGNOSIS — I213 ST elevation (STEMI) myocardial infarction of unspecified site: Secondary | ICD-10-CM

## 2017-10-10 DIAGNOSIS — I2102 ST elevation (STEMI) myocardial infarction involving left anterior descending coronary artery: Secondary | ICD-10-CM

## 2017-10-10 DIAGNOSIS — E1169 Type 2 diabetes mellitus with other specified complication: Secondary | ICD-10-CM

## 2017-10-10 DIAGNOSIS — E785 Hyperlipidemia, unspecified: Secondary | ICD-10-CM

## 2017-10-10 HISTORY — PX: CORONARY STENT INTERVENTION: CATH118234

## 2017-10-10 HISTORY — PX: LEFT HEART CATH AND CORONARY ANGIOGRAPHY: CATH118249

## 2017-10-10 LAB — BASIC METABOLIC PANEL
Anion gap: 16 — ABNORMAL HIGH (ref 5–15)
BUN: 13 mg/dL (ref 6–20)
CALCIUM: 8.9 mg/dL (ref 8.9–10.3)
CO2: 18 mmol/L — AB (ref 22–32)
CREATININE: 0.88 mg/dL (ref 0.44–1.00)
Chloride: 100 mmol/L — ABNORMAL LOW (ref 101–111)
GFR calc Af Amer: >60 mL/min (ref >60)
GFR calc non Af Amer: >60 mL/min (ref >60)
GLUCOSE: 371 mg/dL — AB (ref 65–99)
Potassium: 4.6 mmol/L (ref 3.5–5.1)
Sodium: 134 mmol/L — ABNORMAL LOW (ref 135–145)

## 2017-10-10 LAB — PROTIME-INR
INR: 1.04
Prothrombin Time: 13.5 seconds (ref 11.4–15.2)

## 2017-10-10 LAB — CBC
HCT: 39.6 % (ref 36.0–46.0)
Hemoglobin: 13.2 g/dL (ref 12.0–15.0)
MCH: 27.3 pg (ref 26.0–34.0)
MCHC: 33.3 g/dL (ref 30.0–36.0)
MCV: 81.8 fL (ref 78.0–100.0)
PLATELETS: 337 10*3/uL (ref 150–400)
RBC: 4.84 MIL/uL (ref 3.87–5.11)
RDW: 14.1 % (ref 11.5–15.5)
WBC: 17.4 10*3/uL — ABNORMAL HIGH (ref 4.0–10.5)

## 2017-10-10 LAB — GLUCOSE, CAPILLARY
GLUCOSE-CAPILLARY: 259 mg/dL — AB (ref 65–99)
GLUCOSE-CAPILLARY: 411 mg/dL — AB (ref 65–99)
Glucose-Capillary: 352 mg/dL — ABNORMAL HIGH (ref 65–99)
Glucose-Capillary: 443 mg/dL — ABNORMAL HIGH (ref 65–99)
Glucose-Capillary: 387 mg/dL — ABNORMAL HIGH (ref 65–99)
Glucose-Capillary: 262 mg/dL — ABNORMAL HIGH (ref 65–99)

## 2017-10-10 LAB — COMPREHENSIVE METABOLIC PANEL
ALBUMIN: 3.5 g/dL (ref 3.5–5.0)
ALT: 15 U/L (ref 14–54)
AST: 23 U/L (ref 15–41)
Alkaline Phosphatase: 52 U/L (ref 38–126)
Anion gap: 14 (ref 5–15)
BILIRUBIN TOTAL: 0.5 mg/dL (ref 0.3–1.2)
BUN: 12 mg/dL (ref 6–20)
CO2: 21 mmol/L — ABNORMAL LOW (ref 22–32)
CREATININE: 0.86 mg/dL (ref 0.44–1.00)
Calcium: 8.8 mg/dL — ABNORMAL LOW (ref 8.9–10.3)
Chloride: 100 mmol/L — ABNORMAL LOW (ref 101–111)
GFR calc Af Amer: >60 mL/min (ref >60)
GFR calc non Af Amer: >60 mL/min (ref >60)
GLUCOSE: 339 mg/dL — AB (ref 65–99)
POTASSIUM: 3.8 mmol/L (ref 3.5–5.1)
Sodium: 135 mmol/L (ref 135–145)
TOTAL PROTEIN: 7.4 g/dL (ref 6.5–8.1)

## 2017-10-10 LAB — I-STAT TROPONIN, ED: TROPONIN I, POC: 0.08 ng/mL (ref 0.00–0.08)

## 2017-10-10 LAB — POCT I-STAT, CHEM 8
BUN: 13 mg/dL (ref 6–20)
CALCIUM ION: 1.19 mmol/L (ref 1.15–1.40)
CHLORIDE: 103 mmol/L (ref 101–111)
Creatinine, Ser: 0.6 mg/dL (ref 0.44–1.00)
Glucose, Bld: 336 mg/dL — ABNORMAL HIGH (ref 65–99)
HCT: 40 % (ref 36.0–46.0)
Hemoglobin: 13.6 g/dL (ref 12.0–15.0)
POTASSIUM: 3.7 mmol/L (ref 3.5–5.1)
SODIUM: 138 mmol/L (ref 135–145)
TCO2: 22 mmol/L (ref 22–32)

## 2017-10-10 LAB — I-STAT BETA HCG BLOOD, ED (MC, WL, AP ONLY): I-stat hCG, quantitative: <5 m[IU]/mL (ref <5)

## 2017-10-10 LAB — CBC WITH DIFFERENTIAL/PLATELET
BASOS ABS: 0.1 10*3/uL (ref 0.0–0.1)
BASOS PCT: 1 %
EOS ABS: 0.3 10*3/uL (ref 0.0–0.7)
EOS PCT: 3 %
HEMATOCRIT: 40.3 % (ref 36.0–46.0)
Hemoglobin: 12.7 g/dL (ref 12.0–15.0)
Lymphocytes Relative: 36 %
Lymphs Abs: 4 10*3/uL (ref 0.7–4.0)
MCH: 26.3 pg (ref 26.0–34.0)
MCHC: 31.5 g/dL (ref 30.0–36.0)
MCV: 83.4 fL (ref 78.0–100.0)
MONO ABS: 0.6 10*3/uL (ref 0.1–1.0)
MONOS PCT: 5 %
Neutro Abs: 6.3 10*3/uL (ref 1.7–7.7)
Neutrophils Relative %: 55 %
PLATELETS: 402 10*3/uL — AB (ref 150–400)
RBC: 4.83 MIL/uL (ref 3.87–5.11)
RDW: 13.8 % (ref 11.5–15.5)
WBC: 11.2 10*3/uL — ABNORMAL HIGH (ref 4.0–10.5)

## 2017-10-10 LAB — HEMOGLOBIN A1C
Hgb A1c MFr Bld: 8.6 % — ABNORMAL HIGH (ref 4.8–5.6)
Mean Plasma Glucose: 200.12 mg/dL

## 2017-10-10 LAB — LIPID PANEL
CHOLESTEROL: 165 mg/dL (ref 0–200)
HDL: 52 mg/dL (ref >40)
LDL Cholesterol: 106 mg/dL — ABNORMAL HIGH (ref 0–99)
TRIGLYCERIDES: 35 mg/dL (ref <150)
Total CHOL/HDL Ratio: 3.2 RATIO
VLDL: 7 mg/dL (ref 0–40)

## 2017-10-10 LAB — MRSA PCR SCREENING: MRSA by PCR: NEGATIVE

## 2017-10-10 LAB — POCT ACTIVATED CLOTTING TIME
ACTIVATED CLOTTING TIME: 356 s
ACTIVATED CLOTTING TIME: 296 s
ACTIVATED CLOTTING TIME: 356 s
Activated Clotting Time: 235 seconds

## 2017-10-10 LAB — TROPONIN I
TROPONIN I: 1.05 ng/mL — AB (ref <0.03)
TROPONIN I: 3.26 ng/mL — AB (ref <0.03)
Troponin I: 0.05 ng/mL (ref <0.03)
Troponin I: 0.27 ng/mL (ref <0.03)

## 2017-10-10 LAB — TSH: TSH: 0.672 u[IU]/mL (ref 0.350–4.500)

## 2017-10-10 LAB — HIV ANTIBODY (ROUTINE TESTING W REFLEX): HIV SCREEN 4TH GENERATION: NONREACTIVE

## 2017-10-10 LAB — MAGNESIUM: MAGNESIUM: 1.6 mg/dL — AB (ref 1.7–2.4)

## 2017-10-10 LAB — APTT: aPTT: 21 seconds — ABNORMAL LOW (ref 24–36)

## 2017-10-10 IMAGING — CR DG CHEST 2V
2 series · 2 of 2 positions shown · non-contrast
Comparison: 03/31/2013.

CLINICAL DATA: 44-year-old female with chest pain shortness of
breath and weakness for 1 day. Initial encounter.

EXAM:
CHEST  2 VIEW

[chest pa]
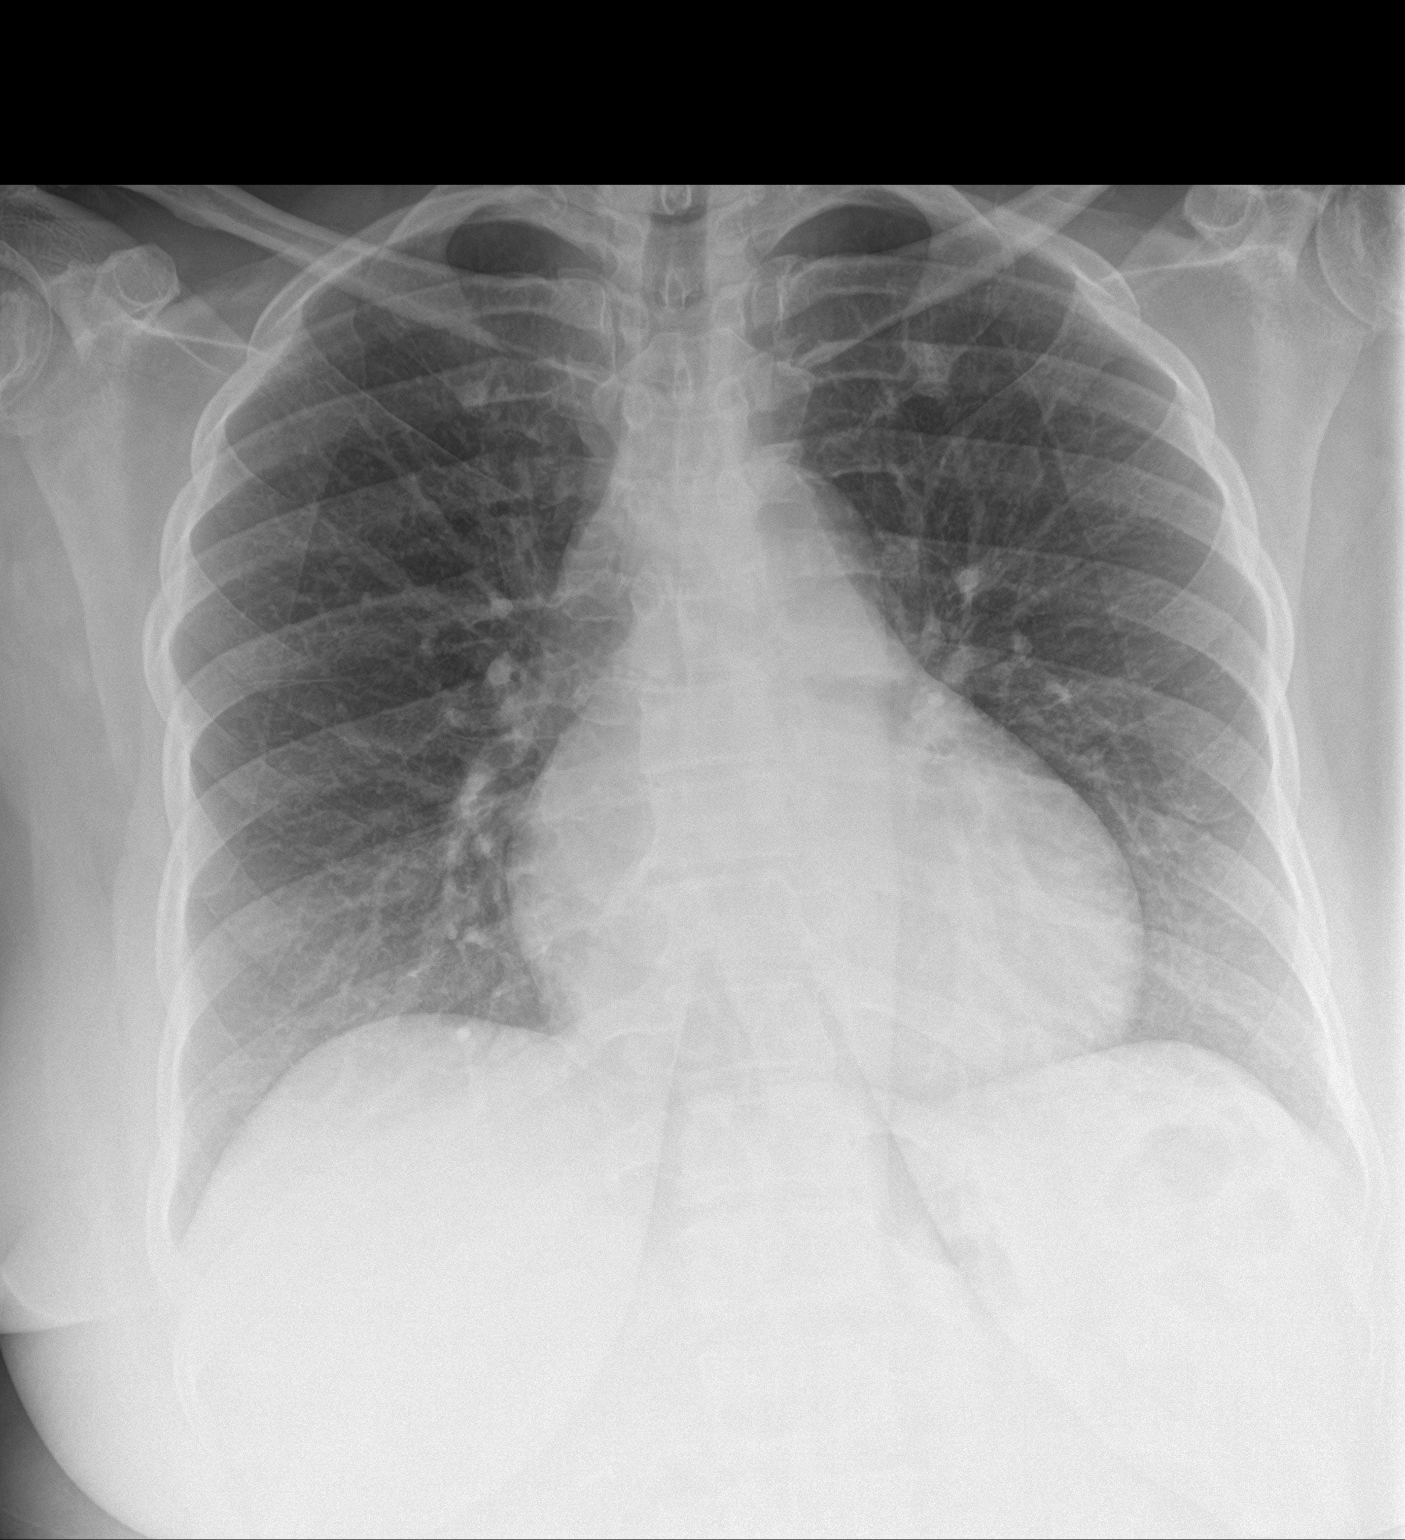

[chest lat]
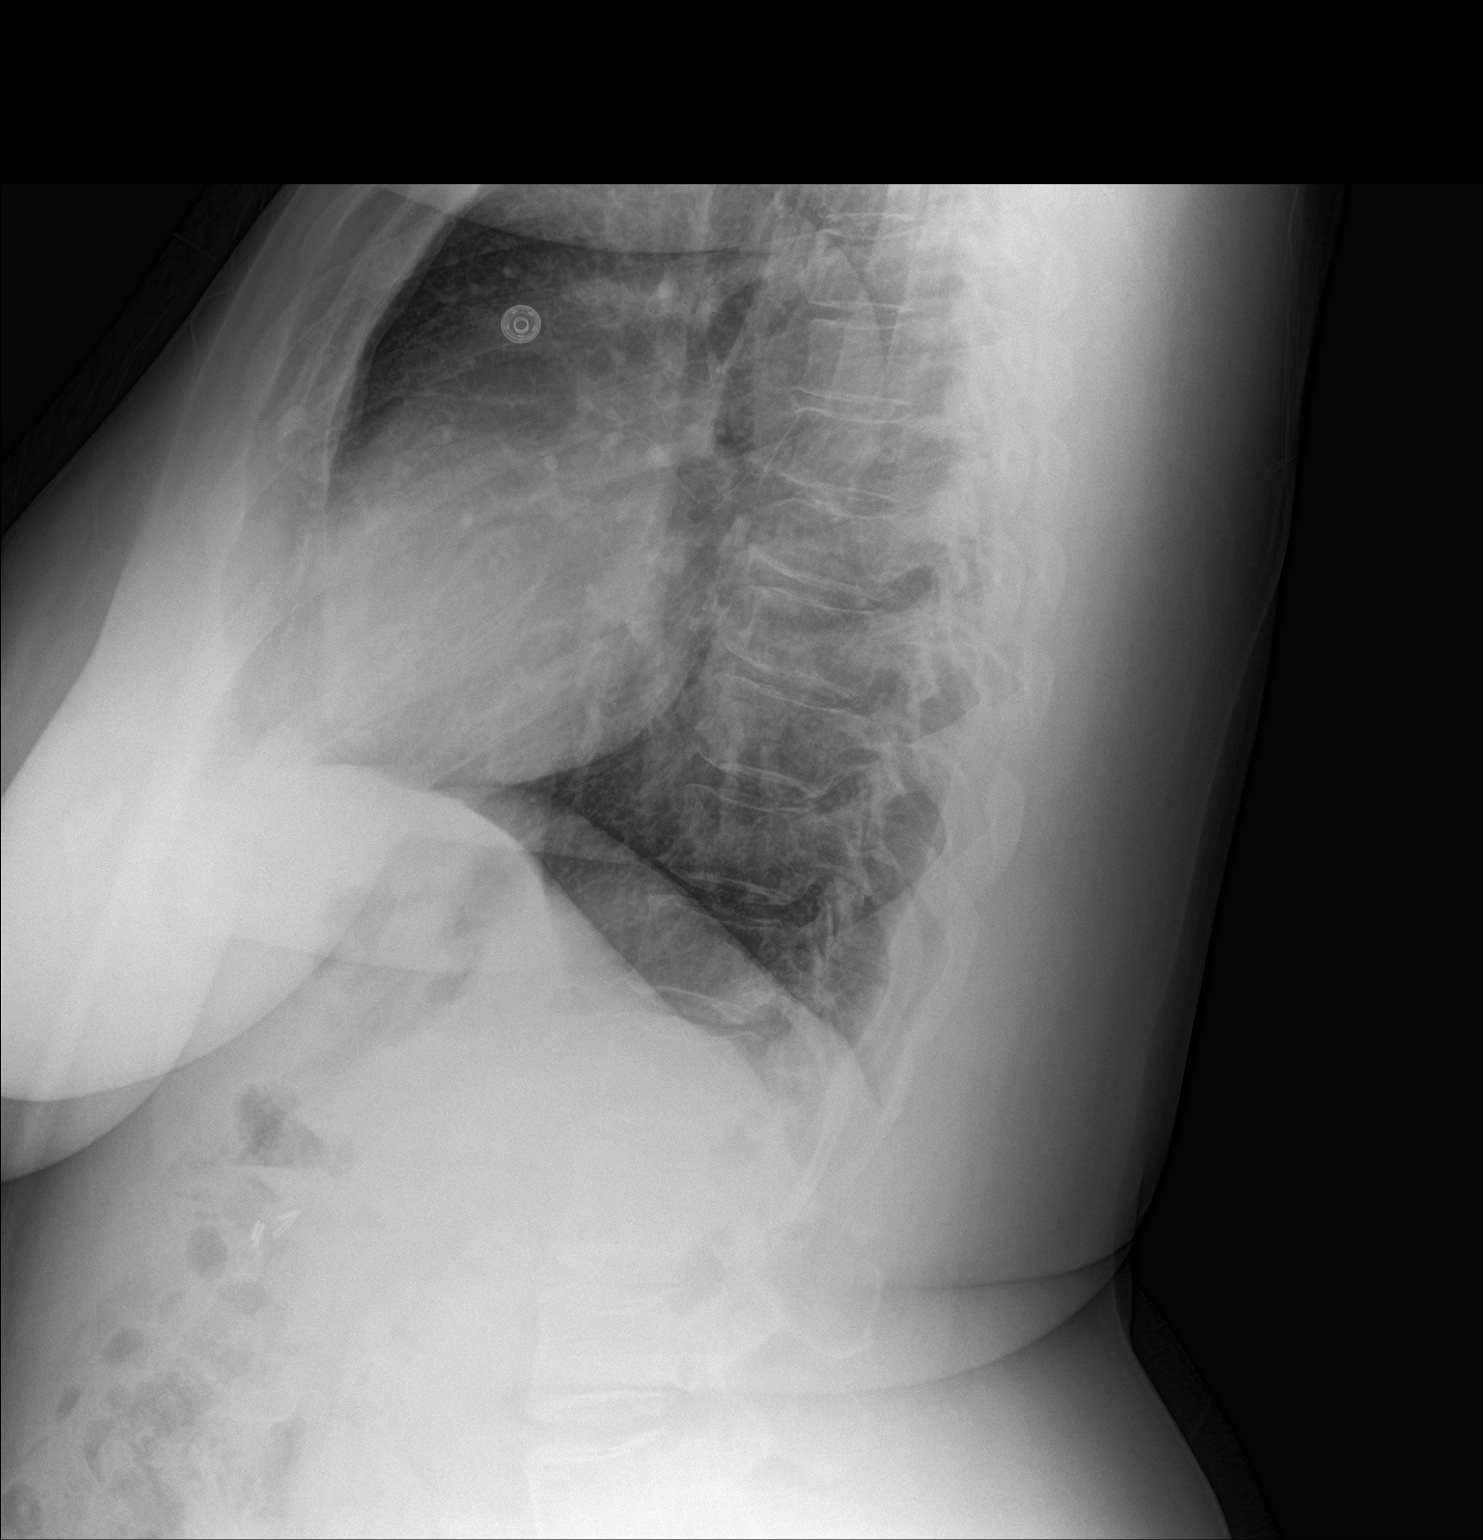

[2 of 2 positions shown; findings below may reference images not displayed]

FINDINGS: Mild to moderate cardiomegaly. Other mediastinal contours are within
normal limits. Visualized tracheal air column is within normal
limits. No pneumothorax, pulmonary edema, pleural effusion or
confluent pulmonary opacity. Cholecystectomy clips. No acute osseous
abnormality identified.
IMPRESSION: Mild to moderate cardiomegaly. No acute cardiopulmonary abnormality.

## 2017-10-10 SURGERY — LEFT HEART CATH AND CORONARY ANGIOGRAPHY
Anesthesia: LOCAL

## 2017-10-10 MED ORDER — MIDAZOLAM HCL 2 MG/2ML IJ SOLN
INTRAMUSCULAR | Status: AC
Start: 2017-10-10 — End: 2017-10-10
  Filled 2017-10-10: qty 2

## 2017-10-10 MED ORDER — IOPAMIDOL (ISOVUE-370) INJECTION 76%
INTRAVENOUS | Status: DC | PRN
  Administered 2017-10-10: 380 mL via INTRAVENOUS

## 2017-10-10 MED ORDER — TIROFIBAN HCL IN NACL 5-0.9 MG/100ML-% IV SOLN
INTRAVENOUS | Status: AC
  Filled 2017-10-10: qty 100

## 2017-10-10 MED ORDER — SODIUM CHLORIDE 0.9 % IV SOLN: 250.0000 mL | INTRAVENOUS | Status: DC | PRN

## 2017-10-10 MED ORDER — HYDRALAZINE HCL 20 MG/ML IJ SOLN: 5.0000 mg | INTRAMUSCULAR | Status: AC | PRN

## 2017-10-10 MED ORDER — MAGNESIUM SULFATE 2 GM/50ML IV SOLN
2.0000 g | Freq: Once | INTRAVENOUS | Status: AC
  Administered 2017-10-10: 2 g via INTRAVENOUS
  Filled 2017-10-10: qty 50

## 2017-10-10 MED ORDER — AMLODIPINE BESYLATE 5 MG PO TABS
5.0000 mg | ORAL_TABLET | Freq: Every day | ORAL | Status: DC
  Administered 2017-10-10 – 2017-10-12 (×3): 5 mg via ORAL
  Filled 2017-10-10 (×3): qty 1

## 2017-10-10 MED ORDER — IOPAMIDOL (ISOVUE-370) INJECTION 76%
INTRAVENOUS | Status: AC
  Filled 2017-10-10: qty 125

## 2017-10-10 MED ORDER — INSULIN ASPART 100 UNIT/ML ~~LOC~~ SOLN
0.0000 [IU] | Freq: Three times a day (TID) | SUBCUTANEOUS | Status: DC
Start: 2017-10-10 — End: 2017-10-10
  Administered 2017-10-10: 15 [IU] via SUBCUTANEOUS

## 2017-10-10 MED ORDER — FENTANYL CITRATE (PF) 100 MCG/2ML IJ SOLN
INTRAMUSCULAR | Status: AC
  Administered 2017-10-10: 25 ug via INTRAVENOUS
  Filled 2017-10-10: qty 2

## 2017-10-10 MED ORDER — ACETAMINOPHEN 325 MG PO TABS
650.0000 mg | ORAL_TABLET | ORAL | Status: DC | PRN
  Administered 2017-10-10 – 2017-10-12 (×6): 650 mg via ORAL
  Filled 2017-10-10 (×6): qty 2

## 2017-10-10 MED ORDER — FUROSEMIDE 10 MG/ML IJ SOLN
INTRAMUSCULAR | Status: DC | PRN
  Administered 2017-10-10: 40 mg via INTRAVENOUS

## 2017-10-10 MED ORDER — ONDANSETRON HCL 4 MG/2ML IJ SOLN: 4.0000 mg | Freq: Four times a day (QID) | INTRAMUSCULAR | Status: DC | PRN

## 2017-10-10 MED ORDER — TIROFIBAN (AGGRASTAT) BOLUS VIA INFUSION
INTRAVENOUS | Status: DC | PRN
  Administered 2017-10-10: 2777.5 ug via INTRAVENOUS

## 2017-10-10 MED ORDER — IOPAMIDOL (ISOVUE-370) INJECTION 76%
INTRAVENOUS | Status: AC
  Filled 2017-10-10: qty 100

## 2017-10-10 MED ORDER — NITROGLYCERIN 1 MG/10 ML FOR IR/CATH LAB
INTRA_ARTERIAL | Status: DC | PRN
  Administered 2017-10-10 (×3): 200 ug via INTRACORONARY

## 2017-10-10 MED ORDER — LABETALOL HCL 5 MG/ML IV SOLN: 10.0000 mg | INTRAVENOUS | Status: AC | PRN

## 2017-10-10 MED ORDER — METOPROLOL TARTRATE 5 MG/5ML IV SOLN
INTRAVENOUS | Status: DC | PRN
  Administered 2017-10-10: 5 mg via INTRAVENOUS

## 2017-10-10 MED ORDER — HEPARIN (PORCINE) IN NACL 2-0.9 UNIT/ML-% IJ SOLN
INTRAMUSCULAR | Status: AC
  Filled 2017-10-10: qty 500

## 2017-10-10 MED ORDER — SODIUM CHLORIDE 0.9% FLUSH: 3.0000 mL | INTRAVENOUS | Status: DC | PRN

## 2017-10-10 MED ORDER — INSULIN PEN NEEDLE 31G X 8 MM MISC: 1.0000 "application " | Freq: Every day | Status: DC

## 2017-10-10 MED ORDER — MORPHINE SULFATE (PF) 10 MG/ML IV SOLN
INTRAVENOUS | Status: DC | PRN
  Administered 2017-10-10: 4 mg via INTRAVENOUS

## 2017-10-10 MED ORDER — PRASUGREL HCL 10 MG PO TABS
ORAL_TABLET | ORAL | Status: DC | PRN
  Administered 2017-10-10: 60 mg via ORAL

## 2017-10-10 MED ORDER — NITROGLYCERIN IN D5W 200-5 MCG/ML-% IV SOLN
INTRAVENOUS | Status: AC
  Filled 2017-10-10: qty 250

## 2017-10-10 MED ORDER — HEPARIN (PORCINE) IN NACL 2-0.9 UNIT/ML-% IJ SOLN
INTRAMUSCULAR | Status: AC | PRN
  Administered 2017-10-10 (×2): 500 mL

## 2017-10-10 MED ORDER — ASPIRIN EC 81 MG PO TBEC
81.0000 mg | DELAYED_RELEASE_TABLET | Freq: Every day | ORAL | Status: DC
  Administered 2017-10-11 – 2017-10-14 (×4): 81 mg via ORAL
  Filled 2017-10-10 (×4): qty 1

## 2017-10-10 MED ORDER — MORPHINE SULFATE (PF) 10 MG/ML IV SOLN
INTRAVENOUS | Status: AC
  Filled 2017-10-10: qty 1

## 2017-10-10 MED ORDER — METOPROLOL TARTRATE 5 MG/5ML IV SOLN
INTRAVENOUS | Status: AC
  Filled 2017-10-10: qty 5

## 2017-10-10 MED ORDER — FUROSEMIDE 10 MG/ML IJ SOLN
INTRAMUSCULAR | Status: AC
  Filled 2017-10-10: qty 4

## 2017-10-10 MED ORDER — ADENOSINE 6 MG/2ML IV SOLN
INTRAVENOUS | Status: AC
  Filled 2017-10-10: qty 2

## 2017-10-10 MED ORDER — VERAPAMIL HCL 2.5 MG/ML IV SOLN
INTRAVENOUS | Status: DC | PRN
  Administered 2017-10-10: 10 mL via INTRA_ARTERIAL

## 2017-10-10 MED ORDER — FENTANYL CITRATE (PF) 100 MCG/2ML IJ SOLN
INTRAMUSCULAR | Status: AC
  Filled 2017-10-10: qty 2

## 2017-10-10 MED ORDER — SODIUM CHLORIDE 0.9% FLUSH
3.0000 mL | Freq: Two times a day (BID) | INTRAVENOUS | Status: DC
  Administered 2017-10-10 – 2017-10-14 (×8): 3 mL via INTRAVENOUS

## 2017-10-10 MED ORDER — PRASUGREL HCL 10 MG PO TABS
ORAL_TABLET | ORAL | Status: AC
  Filled 2017-10-10: qty 6

## 2017-10-10 MED ORDER — METOPROLOL TARTRATE 25 MG PO TABS
25.0000 mg | ORAL_TABLET | Freq: Two times a day (BID) | ORAL | Status: DC
  Administered 2017-10-10 – 2017-10-11 (×4): 25 mg via ORAL
  Filled 2017-10-10 (×5): qty 1

## 2017-10-10 MED ORDER — INSULIN ASPART 100 UNIT/ML ~~LOC~~ SOLN: 0.0000 [IU] | Freq: Every day | SUBCUTANEOUS | Status: DC

## 2017-10-10 MED ORDER — LABETALOL HCL 5 MG/ML IV SOLN
5.0000 mg | Freq: Once | INTRAVENOUS | Status: AC
  Administered 2017-10-10: 5 mg via INTRAVENOUS

## 2017-10-10 MED ORDER — NITROGLYCERIN 0.4 MG SL SUBL: 0.4000 mg | SUBLINGUAL_TABLET | SUBLINGUAL | Status: DC | PRN

## 2017-10-10 MED ORDER — LIDOCAINE HCL (PF) 1 % IJ SOLN
INTRAMUSCULAR | Status: AC
  Filled 2017-10-10: qty 30

## 2017-10-10 MED ORDER — HEPARIN SODIUM (PORCINE) 1000 UNIT/ML IJ SOLN
INTRAMUSCULAR | Status: DC | PRN
  Administered 2017-10-10: 3000 [IU] via INTRAVENOUS
  Administered 2017-10-10 (×2): 2000 [IU] via INTRAVENOUS
  Administered 2017-10-10: 4000 [IU] via INTRAVENOUS

## 2017-10-10 MED ORDER — FENTANYL CITRATE (PF) 100 MCG/2ML IJ SOLN
INTRAMUSCULAR | Status: DC | PRN
  Administered 2017-10-10: 50 ug via INTRAVENOUS
  Administered 2017-10-10 (×3): 25 ug via INTRAVENOUS

## 2017-10-10 MED ORDER — MIDAZOLAM HCL 2 MG/2ML IJ SOLN
INTRAMUSCULAR | Status: DC | PRN
  Administered 2017-10-10 (×4): 1 mg via INTRAVENOUS

## 2017-10-10 MED ORDER — INSULIN ASPART 100 UNIT/ML ~~LOC~~ SOLN
0.0000 [IU] | SUBCUTANEOUS | Status: DC
  Administered 2017-10-10 (×2): 11 [IU] via SUBCUTANEOUS
  Administered 2017-10-10 – 2017-10-11 (×3): 4 [IU] via SUBCUTANEOUS
  Administered 2017-10-11 (×2): 7 [IU] via SUBCUTANEOUS
  Administered 2017-10-11 (×2): 4 [IU] via SUBCUTANEOUS
  Administered 2017-10-12: 3 [IU] via SUBCUTANEOUS

## 2017-10-10 MED ORDER — LABETALOL HCL 5 MG/ML IV SOLN
INTRAVENOUS | Status: AC
  Administered 2017-10-10: 05:00:00
  Filled 2017-10-10: qty 4

## 2017-10-10 MED ORDER — SODIUM CHLORIDE 0.9 % IV SOLN
INTRAVENOUS | Status: DC
  Administered 2017-10-10: via INTRAVENOUS

## 2017-10-10 MED ORDER — PRASUGREL HCL 10 MG PO TABS
10.0000 mg | ORAL_TABLET | Freq: Every day | ORAL | Status: DC
  Administered 2017-10-10 – 2017-10-14 (×5): 10 mg via ORAL
  Filled 2017-10-10 (×5): qty 1

## 2017-10-10 MED ORDER — VERAPAMIL HCL 2.5 MG/ML IV SOLN
INTRAVENOUS | Status: AC
  Filled 2017-10-10: qty 2

## 2017-10-10 MED ORDER — NITROGLYCERIN IN D5W 200-5 MCG/ML-% IV SOLN
0.0000 ug/min | INTRAVENOUS | Status: DC
  Administered 2017-10-10: 5 ug/min via INTRAVENOUS
  Administered 2017-10-11: 180 ug/min via INTRAVENOUS
  Administered 2017-10-11: 75 ug/min via INTRAVENOUS
  Administered 2017-10-11: 200 ug/min via INTRAVENOUS
  Filled 2017-10-10 (×4): qty 250

## 2017-10-10 MED ORDER — ADENOSINE (DIAGNOSTIC) FOR INTRACORONARY USE
INTRAVENOUS | Status: DC | PRN
  Administered 2017-10-10 (×2): 30 ug via INTRACORONARY

## 2017-10-10 MED ORDER — INSULIN DETEMIR 100 UNIT/ML ~~LOC~~ SOLN
12.0000 [IU] | Freq: Every day | SUBCUTANEOUS | Status: DC
  Filled 2017-10-10: qty 0.12

## 2017-10-10 MED ORDER — MIDAZOLAM HCL 2 MG/2ML IJ SOLN
INTRAMUSCULAR | Status: AC
  Filled 2017-10-10: qty 2

## 2017-10-10 MED ORDER — INSULIN ASPART 100 UNIT/ML ~~LOC~~ SOLN
0.0000 [IU] | Freq: Three times a day (TID) | SUBCUTANEOUS | Status: DC
  Administered 2017-10-10: 20 [IU] via SUBCUTANEOUS

## 2017-10-10 MED ORDER — HEPARIN SODIUM (PORCINE) 1000 UNIT/ML IJ SOLN
INTRAMUSCULAR | Status: AC
  Filled 2017-10-10: qty 1

## 2017-10-10 MED ORDER — SODIUM CHLORIDE 0.9 % IV SOLN
INTRAVENOUS | Status: AC
  Administered 2017-10-10: 04:00:00 via INTRAVENOUS

## 2017-10-10 MED ORDER — TRUE METRIX METER W/DEVICE KIT: 1.0000 | PACK | Freq: Three times a day (TID) | Status: DC

## 2017-10-10 MED ORDER — INSULIN DETEMIR 100 UNIT/ML ~~LOC~~ SOLN
20.0000 [IU] | Freq: Every day | SUBCUTANEOUS | Status: DC
  Administered 2017-10-10 – 2017-10-11 (×2): 20 [IU] via SUBCUTANEOUS
  Filled 2017-10-10 (×2): qty 0.2

## 2017-10-10 MED ORDER — ATORVASTATIN CALCIUM 80 MG PO TABS
80.0000 mg | ORAL_TABLET | Freq: Every day | ORAL | Status: DC
  Administered 2017-10-10 – 2017-10-13 (×4): 80 mg via ORAL
  Filled 2017-10-10 (×4): qty 1

## 2017-10-10 MED ORDER — NITROGLYCERIN 1 MG/10 ML FOR IR/CATH LAB
INTRA_ARTERIAL | Status: AC
  Filled 2017-10-10: qty 10

## 2017-10-10 MED ORDER — ASPIRIN 81 MG PO CHEW: 324.0000 mg | CHEWABLE_TABLET | Freq: Once | ORAL | Status: DC

## 2017-10-10 MED ORDER — TIROFIBAN HCL IN NACL 5-0.9 MG/100ML-% IV SOLN
INTRAVENOUS | Status: AC | PRN
  Administered 2017-10-10 (×2): 0.15 ug/kg/min via INTRAVENOUS

## 2017-10-10 MED ORDER — TIROFIBAN HCL IN NACL 5-0.9 MG/100ML-% IV SOLN
0.1500 ug/kg/min | INTRAVENOUS | Status: AC
  Administered 2017-10-10 (×3): 0.15 ug/kg/min via INTRAVENOUS
  Filled 2017-10-10 (×3): qty 100

## 2017-10-10 MED ORDER — FENTANYL CITRATE (PF) 100 MCG/2ML IJ SOLN
25.0000 ug | Freq: Once | INTRAMUSCULAR | Status: AC
  Administered 2017-10-10: 25 ug via INTRAVENOUS

## 2017-10-10 MED ORDER — HEPARIN SODIUM (PORCINE) 5000 UNIT/ML IJ SOLN
4000.0000 [IU] | Freq: Once | INTRAMUSCULAR | Status: AC
  Administered 2017-10-10: 4000 [IU] via INTRAVENOUS

## 2017-10-10 SURGICAL SUPPLY — 30 items
BALLN SAPPHIRE 2.0X15 (BALLOONS) ×2
BALLN SAPPHIRE 2.5X20 (BALLOONS) ×2
BALLN SAPPHIRE 3.0X15 (BALLOONS) ×2
BALLN SAPPHIRE 3.5X20 (BALLOONS) ×2
BALLN SAPPHIRE ~~LOC~~ 4.0X15 (BALLOONS) ×2 IMPLANT
BALLOON SAPPHIRE 2.0X15 (BALLOONS) ×1 IMPLANT
BALLOON SAPPHIRE 2.5X20 (BALLOONS) ×1 IMPLANT
BALLOON SAPPHIRE 3.0X15 (BALLOONS) ×1 IMPLANT
BALLOON SAPPHIRE 3.5X20 (BALLOONS) ×1 IMPLANT
BAND ZEPHYR COMPRESS 30 LONG (HEMOSTASIS) ×2 IMPLANT
CATH IMPULSE 5F ANG/FL3.5 (CATHETERS) ×2 IMPLANT
CATH VISTA GUIDE 6FR XBLAD3.5 (CATHETERS) ×2 IMPLANT
ELECT DEFIB PAD ADLT CADENCE (PAD) ×2 IMPLANT
GLIDESHEATH SLEND A-KIT 6F 22G (SHEATH) ×2 IMPLANT
GUIDEWIRE INQWIRE 1.5J.035X260 (WIRE) ×1 IMPLANT
INQWIRE 1.5J .035X260CM (WIRE) ×2
KIT ENCORE 26 ADVANTAGE (KITS) ×4 IMPLANT
KIT HEART LEFT (KITS) ×2 IMPLANT
PACK CARDIAC CATHETERIZATION (CUSTOM PROCEDURE TRAY) ×2 IMPLANT
STENT SYNERGY DES 2.5X24 (Permanent Stent) ×2 IMPLANT
STENT SYNERGY DES 3.5X28 (Permanent Stent) ×2 IMPLANT
STENT SYNERGY DES 3.5X32 (Permanent Stent) ×2 IMPLANT
STENT SYNERGY DES 3.5X8 (Permanent Stent) ×2 IMPLANT
TRANSDUCER W/STOPCOCK (MISCELLANEOUS) ×2 IMPLANT
TUBING CIL FLEX 10 FLL-RA (TUBING) ×2 IMPLANT
WIRE ASAHI FIELDER XT 190CM (WIRE) ×2 IMPLANT
WIRE HI TORQ BMW 190CM (WIRE) ×2 IMPLANT
WIRE HI TORQ WHISPER MS 190CM (WIRE) ×2 IMPLANT
WIRE MARVEL STR TIP 190CM (WIRE) ×2 IMPLANT
WIRE RUNTHROUGH .014X180CM (WIRE) ×2 IMPLANT

## 2017-10-10 NOTE — H&P (Signed)
CARDIOLOGY HISTORY AND PHYSICAL     Date: 10/10/2017 Admitting Physician: No admitting provider for patient encounter.  Chief Complaint:  STEMI ____________________________________________________________________ History of Present Illness:  Amy Chaney is a 47 y.o. old female with medical history noted below presents with acute complaints of chest pain.  The patient states this started about 9:00 pm this evening and started with a bout of coughing.  She states she then developed 10/10 chest pain she describes as sharp and stabbing in nature.  She has had similar pain to this in the past with her previous heart attacks which have resulted in stents placed in the RCA.  ECG on presentation with ST segment elevation in the inferior leads and reciprical changes in I and aVF.  Review of Systems:   Review of Systems:  GEN: no fever, chills, nausea, vomiting, weight change  HEENT: no vision or hearing changes  PULM: +coughing, SOB  CV: +chest pain, palpitations, PND, orthopnea  GI: no abdominal pain  GU: no dysuria  EXT: no swelling  SKIN: no rashes  NEURO: no numbness or tingling  HEME: no bleeding or bruising  GYN: none  --12 point review systems- otherwise negative.  All other systems reviewed and are negative  Past Medical History:  Diagnosis Date  . Coronary artery disease    a. s/p PCI of the mid RCA 01/2013 //  b. LHC 9/14: EF 55%, distal segment of RCA stent occluded, LAD irregularities >>  subacute stent thrombosis, PCI: 3 x 20 mm Promus premier DES to mid RCA //  c. Myoview 6/17: Intermediate risk, no scar or ischemia, EF 38%, hypertensive blood pressure response (231/132), findings consistent with dilated nonischemic cardiomyopathy   . Daily headache   . Depression   . Dyslipidemia, goal LDL below 70   . History of Doppler ultrasound    carotid bruit >> a. Carotid US 6/17: bilat ICA 1-39%  . History of echocardiogram    a. Echo 6/17: mild LVH, EF 55-60%, no RWMA, Gr 1  DD, mild MR  . Hypertension   . Prolonged Q-T interval on ECG 01/11/2016  . Tobacco abuse 01/11/2016  . Type II diabetes mellitus ([place]) 12/2010   sister and son also diabetic     Past Surgical History:  Procedure Laterality Date  . CHOLECYSTECTOMY  ~ 2000  . CORONARY ANGIOPLASTY WITH STENT PLACEMENT  02/25/2013   "2" (02/25/2013)  . LEFT HEART CATHETERIZATION WITH CORONARY ANGIOGRAM N/A 02/25/2013   Procedure: LEFT HEART CATHETERIZATION WITH CORONARY ANGIOGRAM;  Surgeon: Laverda Page, MD;  Location: [facility] CATH LAB;  Service: Cardiovascular;  Laterality: N/A;  . LEFT HEART CATHETERIZATION WITH CORONARY ANGIOGRAM N/A 04/01/2013   Procedure: LEFT HEART CATHETERIZATION WITH CORONARY ANGIOGRAM;  Surgeon: Laverda Page, MD;  Location: [facility] CATH LAB;  Service: Cardiovascular;  Laterality: N/A;  . PERCUTANEOUS CORONARY STENT INTERVENTION (PCI-S)  04/01/2013   Procedure: PERCUTANEOUS CORONARY STENT INTERVENTION (PCI-S);  Surgeon: Laverda Page, MD;  Location: [facility] CATH LAB;  Service: Cardiovascular;;  . TUBAL LIGATION  1994    Social History   Socioeconomic History  . Marital status: Single    Spouse name: Not on file  . Number of children: Not on file  . Years of education: Not on file  . Highest education level: Not on file  Social Needs  . Financial resource strain: Not on file  . Food insecurity - worry: Not on file  . Food insecurity - inability: Not on file  . Transportation  needs - medical: Not on file  . Transportation needs - non-medical: Not on file  Occupational History  . Not on file  Tobacco Use  . Smoking status: Current Every Day Smoker    Packs/day: 0.50    Years: 15.00    Pack years: 7.50    Types: Cigarettes  . Smokeless tobacco: Never Used  . Tobacco comment: states she has cut back to 56 cigarettes/day   Substance and Sexual Activity  . Alcohol use: Yes    Alcohol/week: 0.0 oz    Comment: 02/25/2013 "only on special occasions; maybe once/yr"  . Drug use: Yes     Types: Marijuana    Comment: 02/25/2013 "quit marijuana ~ 2001"  . Sexual activity: Not Currently    Birth control/protection: Surgical  Other Topics Concern  . Not on file  Social History Narrative  . Not on file    Family History  Problem Relation Age of Onset  . Diabetes Mother   . Diabetes Sister   . Diabetes Sister   . Diabetes Son        type 1  . Diabetes Son        type 2    Past Cardiovascular History:  - No documented h/o CAD - No documented h/o MI - No documented h/o CHF - No documented h/o PVD - No documented h/o AAA - No documented h/o valvular heart disease - No documented h/o CVA - No documented h/o Arrhythmias - No documented h/o A-fib  - No documented h/o congenital heart disease - No documented h/o CABG - No documented h/o PCI - No documented h/o cardiac devices (Pacer/ICD/CRT) - No documented h/o cardiac surgery       Most recent stress test:  None  Most recent echocardiography:  01/27/16 - Left ventricle: The cavity size was normal. Wall thickness was   increased in a pattern of mild LVH. Systolic function was normal.   The estimated ejection fraction was in the range of 55% to 60%.   Wall motion was normal; there were no regional wall motion   abnormalities. Doppler parameters are consistent with abnormal   left ventricular relaxation (grade 1 diastolic dysfunction). - Mitral valve: There was mild regurgitation.  Most recent left heart catheterization:  04/01/13 Right coronary artery: It's a large vessel, previously placed stent in the proximal segment where patent, however distal segment was occluded. No collaterals were evident from the left system.  Left main coronary artery is large and normal.  Circumflex coronary artery: A large vessel giving origin to a large high obtuse marginal 1. It also gives origin to a large OM 2 and continues in the AV groove, smooth and normal.  LAD: LAD gives origin to a large diagonal-1 and D2. LAD has very  mild luminal irregularities.   CABG:  Date/ Physician: None  Device history:  None  Prior to Admission medications   Medication Sig Start Date End Date Taking? Authorizing Provider  amLODipine (NORVASC) 5 MG tablet Take 1 tablet (5 mg total) by mouth daily. Patient not taking: Reported on 10/10/2017 01/31/16   Richardson Dopp T, PA-C  aspirin EC 81 MG tablet Take 1 tablet (81 mg total) by mouth daily. Patient not taking: Reported on 10/10/2017 11/25/15   Boykin Nearing, MD  atorvastatin (LIPITOR) 20 MG tablet Take 1 tablet (20 mg total) by mouth daily. Patient not taking: Reported on 10/10/2017 02/04/16   Sueanne Margarita, MD  Blood Glucose Monitoring Suppl (TRUE METRIX METER) w/Device KIT  1 each by Does not apply route 3 (three) times daily. 11/25/15   Funches, Adriana Mccallum, MD  Insulin Pen Needle (B-D ULTRAFINE III SHORT PEN) 31G X 8 MM MISC 1 application by Does not apply route daily. 11/25/15   Boykin Nearing, MD  Liraglutide 18 MG/3ML SOPN 0.6 mg daily for one week, then 1.2 mg daily Patient not taking: Reported on 10/10/2017 11/25/15   Boykin Nearing, MD  lisinopril (PRINIVIL,ZESTRIL) 10 MG tablet Take 1 tablet (10 mg total) by mouth daily. Patient not taking: Reported on 10/10/2017 01/31/16   Richardson Dopp T, PA-C  metFORMIN (GLUCOPHAGE-XR) 500 MG 24 hr tablet Take 2 tablets (1,000 mg total) by mouth daily with breakfast. Patient not taking: Reported on 10/10/2017 11/25/15   Boykin Nearing, MD  metoprolol tartrate (LOPRESSOR) 25 MG tablet Take 1 tablet (25 mg total) by mouth 2 (two) times daily. Patient not taking: Reported on 10/10/2017 01/31/16   Richardson Dopp T, PA-C  nitroGLYCERIN (NITROSTAT) 0.4 MG SL tablet Place 1 tablet (0.4 mg total) under the tongue every 5 (five) minutes as needed for chest pain. Reported on 11/25/2015 Patient not taking: Reported on 10/10/2017 01/31/16   Richardson Dopp T, PA-C  TRUEPLUS LANCETS 28G MISC 1 each by Other route 3 (three) times daily. 11/25/15   Boykin Nearing, MD      No Known Allergies  Social History:   Social History   Tobacco Use  . Smoking status: Current Every Day Smoker    Packs/day: 0.50    Years: 15.00    Pack years: 7.50    Types: Cigarettes  . Smokeless tobacco: Never Used  . Tobacco comment: states she has cut back to 56 cigarettes/day   Substance Use Topics  . Alcohol use: Yes    Alcohol/week: 0.0 oz    Comment: 02/25/2013 "only on special occasions; maybe once/yr"  . Drug use: Yes    Types: Marijuana    Comment: 02/25/2013 "quit marijuana ~ 2001"    Family History  Problem Relation Age of Onset  . Diabetes Mother   . Diabetes Sister   . Diabetes Sister   . Diabetes Son        type 1  . Diabetes Son        type 2    Physical Examination: Blood pressure (!) 177/88, pulse 97, temperature 97.7 F (36.5 C), temperature source Oral, resp. rate (!) 27, height _0  (1.753 m), weight 111.1 kg (245 lb), last menstrual period 10/20/2015, SpO2 98 %. General:  AAOX 4.  NAD.  NRD.  HENT: Normocephalic. Atraumatic.  No acute abnom. EYES: PERRL EOMI  Neck: Supple.  No JVD.  No bruits. Cardiovascular:  Nl S1. Nl S2. No S3. No S4. Nl PMI. No m/r/c. RRR  Pulmonary/Chest: CTA B. No rales. No wheezing.  Abdomen: Soft, NT, no masses, no organomegaly. Neuro: CN intact, no motor/sensory deficit.  Ext: Warm. No edema.  SKIN- intact  No intake or output data in the 24 hours ending 10/10/17 0033  Troponin (Point of Care Test) Recent Labs    10/10/17 0009  TROPIPOC 0.08   ____________________________________________________________________ Assessment/Plan  STEMI   Assessment:  Patient presents with inferior STEMI.     Plan  -  Proceed to the lab  -  Heparin, ASA, beta blockade, lisinopril  -  ECHO  -  Further recommendations following coronary angiogram  Thank you for consulting cardiology.    Electronically signed by Lowella Dandy 10/10/2017 Baruch Merl, MD, PhD Cardiology

## 2017-10-10 NOTE — Progress Notes (Addendum)
Progress Note  Patient Name: Amy Chaney Date of Encounter: 10/10/2017  Primary Cardiologist: Dr. Gwenlyn Found  Subjective   The patient's verbal responses were slowed today during evaluation.  She stated that her chest pain had improved, she is feeling somewhat better overall.  She agreed to stop smoking, and to attempt compliance with her medication regimen as an outpatient.  It was thoroughly explained her condition will be much worsened by the continuation of her smoking and lower medication noncompliance.  Inpatient Medications    Scheduled Meds: . amLODipine  5 mg Oral Daily  . aspirin  324 mg Oral Once  . [START ON 10/11/2017] aspirin EC  81 mg Oral Daily  . atorvastatin  80 mg Oral q1800  . insulin aspart  0-15 Units Subcutaneous TID WC  . metoprolol tartrate  25 mg Oral BID  . prasugrel  10 mg Oral Daily  . sodium chloride flush  3 mL Intravenous Q12H   Continuous Infusions: . sodium chloride 20 mL/hr at 10/10/17 0900  . sodium chloride 100 mL/hr at 10/10/17 0900  . sodium chloride    . nitroGLYCERIN 40 mcg/min (10/10/17 0900)  . tirofiban 0.15 mcg/kg/min (10/10/17 0900)   PRN Meds: sodium chloride, acetaminophen, hydrALAZINE, labetalol, nitroGLYCERIN, ondansetron (ZOFRAN) IV, sodium chloride flush   Vital Signs    Vitals:   10/10/17 0715 10/10/17 0747 10/10/17 0800 10/10/17 0900  BP: 107/62  140/76 (!) 150/80  Pulse: 60  71 75  Resp: (!) 26  (!) 27 (!) 22  Temp:  97.6 F (36.4 C) 97.6 F (36.4 C)   TempSrc:  Oral Oral   SpO2: 97%  99% 99%  Weight:      Height:        Intake/Output Summary (Last 24 hours) at 10/10/2017 0946 Last data filed at 10/10/2017 0900 Gross per 24 hour  Intake 1188.23 ml  Output 3150 ml  Net -1961.77 ml   Filed Weights   10/09/17 2356 10/10/17 0400  Weight: 111.1 kg (245 lb) 86.1 kg (189 lb 13.1 oz)    Telemetry    Normal sinus rhythm with recurrent PVCs- Personally Reviewed  ECG    Normal sinus rhythm.  There is ST  elevation in the anteroseptal leads which is consistent with her STEMI and expected progression.- Personally Reviewed  Physical Exam   GEN: No acute distress.  Patients verbal responses are slowed.   Neck: No JVD Cardiac: RRR, no murmurs, rubs, or gallops.  Respiratory: Clear to auscultation bilaterally. GI: Soft, nontender, non-distended  MS: No edema; No deformity. Neuro:  Nonfocal.  Alert and oriented x3 Psych: Normal affect   Labs    Chemistry Recent Labs  Lab 10/10/17 0416 10/10/17 0804  NA 135 134*  K 3.8 4.6  CL 100* 100*  CO2 21* 18*  GLUCOSE 339* 371*  BUN 12 13  CREATININE 0.86 0.88  CALCIUM 8.8* 8.9  PROT 7.4  --   ALBUMIN 3.5  --   AST 23  --   ALT 15  --   ALKPHOS 52  --   BILITOT 0.5  --   GFRNONAA >60 >60  GFRAA >60 >60  ANIONGAP 14 16*     Hematology Recent Labs  Lab 10/10/17 0003 10/10/17 0804  WBC 11.2* 17.4*  RBC 4.83 4.84  HGB 12.7 13.2  HCT 40.3 39.6  MCV 83.4 81.8  MCH 26.3 27.3  MCHC 31.5 33.3  RDW 13.8 14.1  PLT 402* 337    Cardiac Enzymes Recent  Labs  Lab 10/10/17 0003 10/10/17 0416  TROPONINI 0.05* 0.27*    Recent Labs  Lab 10/10/17 0009  TROPIPOC 0.08     BNPNo results for input(s): BNP, PROBNP in the last 168 hours.   DDimer No results for input(s): DDIMER in the last 168 hours.   Radiology    Dg Chest Portable 1 View  Result Date: 10/10/2017 CLINICAL DATA:  Acute onset of cough and vomiting. Generalized chest pain and shortness of breath. EXAM: PORTABLE CHEST 1 VIEW COMPARISON:  Chest radiograph performed 10/01/2017 FINDINGS: The lungs are well-aerated. Vascular congestion is noted. Increased interstitial markings raise concern for mild interstitial edema. There is no evidence of focal opacification, pleural effusion or pneumothorax. The cardiomediastinal silhouette is borderline normal in size. No acute osseous abnormalities are seen. IMPRESSION: Vascular congestion. Increased interstitial markings raise concern  for mild interstitial edema. Electronically Signed   By: Garald Balding M.D.   On: 10/10/2017 01:02    Cardiac Studies  Left Heart Cath 10/10/2017-->  Culprit lesion is bifurcation LAD-D2 ([place] 1, 1, 1)  Prox LAD to Mid LAD lesion is 90% stenosed.  3 overlapping Drug-Eluting Stents were successfully placed using a STENT SYNERGY DES 3.5X32 (p),STENT SYNERGY DES 3.5X 8 (m), STENT SYNERGY DES 3.5X28 (d)  Post intervention, there is a 0% residual stenosis.  Ost 2nd Diag lesion is 90% stenosed. Post intervention, there is a 95% residual stenosis.  A drug-eluting stent was successfully placed using a STENT SYNERGY DES 2.5X24. However after crush technique was performed, there was plaque/thrombus shift into the ostium of this vessel that could not be rewired. Leaving the vessel subtotally occluded.  Prox RCA to Mid RCA 3 overlapping DES stents -- 5% stenosed. Mid RCA to Dist RCA lesion is 40% stenosed.  Ost 2nd Mrg to 2nd Mrg lesion is 30% stenosed.  LV end diastolic pressure is severely elevated.   Actually patent stents throughout the RCA with moderate disease distal to the stent.   Culprit lesion is bifurcation LAD-D2 [place] M[place] 1, 1, 1) -after initial balloon angioplasty became clear that this was a thrombotic lesion) --ballooning both lesions led to temporary 100% closure of the LAD followed by 100% closure of the diagonal.  There was an apparent dissection associated with this lesion that was localized and covered with a stents  Patient Profile     47 y.o. female who presented to the emergency department with chest pain and dyspnea.  She is a prominent past medical history notable for significant coronary artery disease s/p CTO PCI to the RCA July 2014.  2 months following stent placement she has subacute stent thrombosis due to failure to adapt.  Unfortunately she failed to continue to follow-up with cardiology since July 2017 and has been noncompliant with medications for greater than 1  year.  She reportedly ran out of her medications including her diabetes medications, antihypertensives, and cardiac medications at this timeframe.  The evening of her admission she developed sudden onset dyspnea and chest pain also 9 PM EMS S was called she was brought to the ED where a STEMI was diagnosed.  She was taken to the Cath Lab emergently with multiple stents placed in the LAD (please see Cath Lab note above).  Assessment & Plan    STEMI: Continue maximal medical therapy following cardiac catheterization with stent placement.  She will need to continue on dual antiplatelet therapy and a beta-blocker +/- ACE inhibitor long-term. -Continue to trend troponins x3 ordered -Follow-up echo ordered -EKG daily  for progression  -Continue aspirin, and Aggrastat -Continue atorvastatin -Continue nitroglycerin sublingual as needed chest pain -Continue metoprolol tartrate 25 mg twice daily  Insulin dependent diabetes: Continue sliding scale insulin resistant scale imitated.  Levemir 12 units QHS with HS ssi coverage.   Essential hypertension: Blood pressure has been stable since admission but slightly elevated.  Continue left amlodipine 5 mg daily Considering ACEi if BP continues to increase and in setting of STEMI.  For questions or updates, please contact [place] Please consult [email or web address] for contact info under Cardiology/STEMI.   Signed, Kathi Ludwig, MD  10/10/2017, 9:46 AM    Agree with note by Dr. Berline Lopes  Ms. Dargan was admitted last night with a anterior STEMI and underwent complex PCI and drug-eluting stenting by Dr. Ellyn Hack of her mid LAD with drug-eluting stents. Her previously placed RCA stents were widely patent. She apparently stopped taking all her medications a year ago and does continue to smoke. She's had no residual or subsequent chest pain. Her exam is benign. She is on appropriate medical therapy including Aggrastat which was begun because of a  thrombotic occlusion of a small diagonal branch. Dr. Ellyn Hack suggested relook angiography tomorrow which we will discuss.  Lorretta Harp, M.D., [place], [facility], Laverta [place] [address] [place], [place]  [id number]  [phone] 10/10/2017 11:26 AM

## 2017-10-10 NOTE — ED Provider Notes (Signed)
[place] EMERGENCY DEPARTMENT Provider Note   CSN: [id number] Arrival date & time: 10/09/17  2339     History   Chief Complaint Chief Complaint  Patient presents with  . Chest Pain  . Shortness of Breath  . Code STEMI    HPI Amy Chaney is a 47 y.o. female.   The history is provided by the patient.   Chest Pain    Associated symptoms include shortness of breath.   Shortness of Breath   Associated symptoms include chest pain.  She has history of coronary artery disease status post stent placement, hypertension, diabetes, hyperlipidemia, prolonged QT interval and ongoing tobacco use.  As she comes in with onset about 9 PM of coughing and vague discomfort in the lower sternal/epigastric area without radiation.  There is nausea but no vomiting.  She denies diaphoresis.  Nothing makes it better, nothing makes it worse.  She came in by ambulance who gave her nitroglycerin and aspirin.  Nitroglycerin did not give any relief.  Past Medical History:  Diagnosis Date  . Coronary artery disease    a. s/p PCI of the mid RCA 01/2013 //  b. LHC 9/14: EF 55%, distal segment of RCA stent occluded, LAD irregularities >>  subacute stent thrombosis, PCI: 3 x 20 mm Promus premier DES to mid RCA //  c. Myoview 6/17: Intermediate risk, no scar or ischemia, EF 38%, hypertensive blood pressure response (231/132), findings consistent with dilated nonischemic cardiomyopathy   . Daily headache   . Depression   . Dyslipidemia, goal LDL below 70   . History of Doppler ultrasound    carotid bruit >> a. Carotid US 6/17: bilat ICA 1-39%  . History of echocardiogram    a. Echo 6/17: mild LVH, EF 55-60%, no RWMA, Gr 1 DD, mild MR  . Hypertension   . Prolonged Q-T interval on ECG 01/11/2016  . Tobacco abuse 01/11/2016  . Type II diabetes mellitus ([place]) 12/2010   sister and son also diabetic     Patient Active Problem List   Diagnosis Date Noted  . Chest pain 01/11/2016  . Carotid  artery disease ([place]) 01/11/2016  . Prolonged Q-T interval on ECG 01/11/2016  . Tobacco abuse counseling 01/11/2016  . Heart palpitations 01/11/2016  . Obesity (BMI 35.0-39.9 without comorbidity) 11/25/2015  . Smoker 03/03/2015  . CAD (coronary artery disease), native coronary artery 12/09/2014  . Chronic combined systolic (congestive) and diastolic (congestive) heart failure ([place]) 12/09/2014  . H/O noncompliance with medical treatment, presenting hazards to health 12/09/2014  . Status post primary angioplasty with coronary stent 11/18/2014  . Diabetes type 2, uncontrolled ([place]) 04/01/2013  . Hypercholesterolemia 04/01/2013  . Depression 04/01/2013  . Stress at home 04/01/2013  . Hypertension 09/05/2011    Past Surgical History:  Procedure Laterality Date  . CHOLECYSTECTOMY  ~ 2000  . CORONARY ANGIOPLASTY WITH STENT PLACEMENT  02/25/2013   "2" (02/25/2013)  . LEFT HEART CATHETERIZATION WITH CORONARY ANGIOGRAM N/A 02/25/2013   Procedure: LEFT HEART CATHETERIZATION WITH CORONARY ANGIOGRAM;  Surgeon: Laverda Page, MD;  Location: [facility] CATH LAB;  Service: Cardiovascular;  Laterality: N/A;  . LEFT HEART CATHETERIZATION WITH CORONARY ANGIOGRAM N/A 04/01/2013   Procedure: LEFT HEART CATHETERIZATION WITH CORONARY ANGIOGRAM;  Surgeon: Laverda Page, MD;  Location: [facility] CATH LAB;  Service: Cardiovascular;  Laterality: N/A;  . PERCUTANEOUS CORONARY STENT INTERVENTION (PCI-S)  04/01/2013   Procedure: PERCUTANEOUS CORONARY STENT INTERVENTION (PCI-S);  Surgeon: Laverda Page, MD;  Location: [facility]  CATH LAB;  Service: Cardiovascular;;  . TUBAL LIGATION  1994    OB History    Gravida Para Term Preterm AB Living   3 3 3  0 0 3   SAB TAB Ectopic Multiple Live Births   0 0 0 0         Home Medications    Prior to Admission medications   Medication Sig Start Date End Date Taking? Authorizing Provider  amLODipine (NORVASC) 5 MG tablet Take 1 tablet (5 mg total) by mouth daily. 01/31/16   Richardson Dopp T, PA-C  aspirin EC 81 MG tablet Take 1 tablet (81 mg total) by mouth daily. 11/25/15   Funches, Adriana Mccallum, MD  atorvastatin (LIPITOR) 20 MG tablet Take 1 tablet (20 mg total) by mouth daily. 02/04/16   Sueanne Margarita, MD  Blood Glucose Monitoring Suppl (TRUE METRIX METER) w/Device KIT 1 each by Does not apply route 3 (three) times daily. Patient not taking: Reported on 01/31/2016 11/25/15   Boykin Nearing, MD  ferrous sulfate 325 (65 FE) MG tablet Take 325 mg by mouth daily with breakfast. Reported on 11/25/2015    [provider]  Insulin Pen Needle (B-D ULTRAFINE III SHORT PEN) 31G X 8 MM MISC 1 application by Does not apply route daily. 11/25/15   Boykin Nearing, MD  Liraglutide 18 MG/3ML SOPN 0.6 mg daily for one week, then 1.2 mg daily 11/25/15   Funches, Adriana Mccallum, MD  lisinopril (PRINIVIL,ZESTRIL) 10 MG tablet Take 1 tablet (10 mg total) by mouth daily. 01/31/16   Richardson Dopp T, PA-C  metFORMIN (GLUCOPHAGE-XR) 500 MG 24 hr tablet Take 2 tablets (1,000 mg total) by mouth daily with breakfast. 11/25/15   Boykin Nearing, MD  metoprolol tartrate (LOPRESSOR) 25 MG tablet Take 1 tablet (25 mg total) by mouth 2 (two) times daily. 01/31/16   Richardson Dopp T, PA-C  nitroGLYCERIN (NITROSTAT) 0.4 MG SL tablet Place 1 tablet (0.4 mg total) under the tongue every 5 (five) minutes as needed for chest pain. Reported on 11/25/2015 01/31/16   Richardson Dopp T, PA-C  TRUEPLUS LANCETS 28G MISC 1 each by Other route 3 (three) times daily. 11/25/15   Boykin Nearing, MD    Family History Family History  Problem Relation Age of Onset  . Diabetes Mother   . Diabetes Sister   . Diabetes Sister   . Diabetes Son        type 1  . Diabetes Son        type 2    Social History Social History   Tobacco Use  . Smoking status: Current Every Day Smoker    Packs/day: 0.50    Years: 15.00    Pack years: 7.50    Types: Cigarettes  . Smokeless tobacco: Never Used  . Tobacco comment: states she has cut back  to 56 cigarettes/day   Substance Use Topics  . Alcohol use: Yes    Alcohol/week: 0.0 oz    Comment: 02/25/2013 "only on special occasions; maybe once/yr"  . Drug use: Yes    Types: Marijuana    Comment: 02/25/2013 "quit marijuana ~ 2001"     Allergies   Patient has no known allergies.   Review of Systems Review of Systems  Respiratory: Positive for shortness of breath.   Cardiovascular: Positive for chest pain.  All other systems reviewed and are negative.    Physical Exam Updated Vital Signs BP 107/83 (BP Location: Right Arm)   Pulse (!) 107   Temp 97.7  F (36.5 C) (Oral)   Resp 18   Ht 5' 9"  (1.753 m)   Wt 111.1 kg (245 lb)   LMP 10/20/2015   SpO2 95%   BMI 36.18 kg/m    Physical Exam  Nursing note and vitals reviewed.  47 year old female, appears uncomfortable, but is in no acute distress. Vital signs are significant for mildly elevated heart rate. Oxygen saturation is 95%, which is normal. Head is normocephalic and atraumatic. PERRLA, EOMI. Oropharynx is clear. Neck is nontender and supple without adenopathy or JVD. Back is nontender and there is no CVA tenderness. Lungs are clear without rales, wheezes, or rhonchi. Chest is nontender. Heart has regular rate and rhythm without murmur. Abdomen is soft, flat, with mild epigastric tenderness.  There is no rebound or guarding.  There are no masses or hepatosplenomegaly and peristalsis is hypoactive. Extremities have no cyanosis or edema, full range of motion is present. Skin is warm and dry without rash. Neurologic: Mental status is normal, cranial nerves are intact, there are no motor or sensory deficits.  ED Treatments / Results  Labs (all labs ordered are listed, but only abnormal results are displayed) Labs Reviewed  CBC WITH DIFFERENTIAL/PLATELET - Abnormal; Notable for the following components:      Result Value   WBC 11.2 (*)    Platelets 402 (*)    All other components within normal limits  TROPONIN  I - Abnormal; Notable for the following components:   Troponin I 0.05 (*)    All other components within normal limits  APTT - Abnormal; Notable for the following components:   aPTT 21 (*)    All other components within normal limits  TROPONIN I - Abnormal; Notable for the following components:   Troponin I 0.27 (*)    All other components within normal limits  MAGNESIUM - Abnormal; Notable for the following components:   Magnesium 1.6 (*)    All other components within normal limits  HEMOGLOBIN A1C - Abnormal; Notable for the following components:   Hgb A1c MFr Bld 8.6 (*)    All other components within normal limits  LIPID PANEL - Abnormal; Notable for the following components:   LDL Cholesterol 106 (*)    All other components within normal limits  COMPREHENSIVE METABOLIC PANEL - Abnormal; Notable for the following components:   Chloride 100 (*)    CO2 21 (*)    Glucose, Bld 339 (*)    Calcium 8.8 (*)    All other components within normal limits  MRSA PCR SCREENING  PROTIME-INR  TSH  HIV ANTIBODY (ROUTINE TESTING)  TROPONIN I  TROPONIN I  BASIC METABOLIC PANEL  CBC  I-STAT TROPONIN, ED  I-STAT BETA HCG BLOOD, ED ([facility], [facility], [facility] ONLY)  I-STAT BETA HCG BLOOD, ED ([facility], [facility], [facility] ONLY)    EKG  EKG Interpretation  Date/Time:  Tuesday October 09 2017 23:51:53 EDT Ventricular Rate:  71 PR Interval:    QRS Duration: 104 QT Interval:  498 QTC Calculation: 542 R Axis:   54 Text Interpretation:  Sinus rhythm Probable left atrial enlargement LVH with secondary repolarization abnormality Inferior infarct, acute (RCA) Prolonged QT interval Probable RV involvement, suggest recording right precordial leads Baseline wander in lead(s) III aVL V1 When compared with ECG of 10/01/2017, ** ** ACUTE MI / STEMI ** ** is now present Confirmed by Delora Fuel ([id number]) on 10/10/2017 12:04:50 AM       Radiology Dg Chest Portable 1 View  Result Date:  10/10/2017 CLINICAL DATA:  Acute onset of cough and  vomiting. Generalized chest pain and shortness of breath. EXAM: PORTABLE CHEST 1 VIEW COMPARISON:  Chest radiograph performed 10/01/2017 FINDINGS: The lungs are well-aerated. Vascular congestion is noted. Increased interstitial markings raise concern for mild interstitial edema. There is no evidence of focal opacification, pleural effusion or pneumothorax. The cardiomediastinal silhouette is borderline normal in size. No acute osseous abnormalities are seen. IMPRESSION: Vascular congestion. Increased interstitial markings raise concern for mild interstitial edema. Electronically Signed   By: Garald Balding M.D.   On: 10/10/2017 01:02    Procedures Procedures  CRITICAL CARE Performed by: Delora Fuel Total critical care time: 45 minutes Critical care time was exclusive of separately billable procedures and treating other patients. Critical care was necessary to treat or prevent imminent or life-threatening deterioration. Critical care was time spent personally by me on the following activities: development of treatment plan with patient and/or surrogate as well as nursing, discussions with consultants, evaluation of patient's response to treatment, examination of patient, obtaining history from patient or surrogate, ordering and performing treatments and interventions, ordering and review of laboratory studies, ordering and review of radiographic studies, pulse oximetry and re-evaluation of patient's condition.  Medications Ordered in ED Medications  0.9 %  sodium chloride infusion ( Intravenous New Bag/Given 10/10/17 0006)  aspirin chewable tablet 324 mg (not administered)  heparin injection 4,000 Units (4,000 Units Intravenous Given 10/10/17 0006)     Initial Impression / Assessment and Plan / ED Course  I have reviewed the triage vital signs and the nursing notes.  Pertinent labs & imaging results that were available during my care of the patient were reviewed by me and considered in my medical  decision making (see chart for details).  Vague chest discomfort.  However, ECG shows changes of inferior wall STEMI with some involvement of the lateral wall as well.  This is a dramatic change from an ECG done on March 4.  Code STEMI is activated and she is given intravenous heparin.  Old records are reviewed, and she had stent placed in her right coronary artery in July 2014.  Based on ECG, this is the likely culprit vessel for her current symptoms suggesting possible in-stent stenosis.  Case is discussed with Dr. Ellyn Hack, on call for STEMI, who is coming to evaluate the patient for urgent catheterization and PCI.  Final Clinical Impressions(s) / ED Diagnoses   Final diagnoses:  ST elevation myocardial infarction (STEMI), unspecified artery [facility])    ED Discharge Orders    None      Delora Fuel, MD 47/82/95 0745

## 2017-10-10 NOTE — Progress Notes (Signed)
Pt complaining of 10/10 chest pain with sudden onset.  Dr. Gwenlyn Found paged and now @bedside . EKG completed and 8mcg of fentanyl given

## 2017-10-10 NOTE — Progress Notes (Signed)
Paged IM resident x2 ([phone]) for patient's CBG >400. No return page from the resident. Gave patient 20 units of regular insulin per sliding scale. Spoke with diabetes coordinator.  Will continue to monitor

## 2017-10-10 NOTE — ED Notes (Signed)
Cardiology at the bedside.

## 2017-10-10 NOTE — Progress Notes (Signed)
Inpatient Diabetes Program Recommendations  AACE/ADA: New Consensus Statement on Inpatient Glycemic Control (2015)  Target Ranges:  Prepandial:   less than 140 mg/dL      Peak postprandial:   less than 180 mg/dL (1-2 hours)      Critically ill patients:  140 - 180 mg/dL   Lab Results  Component Value Date   GLUCAP 387 (H) 10/10/2017   HGBA1C 8.6 (H) 10/10/2017    Review of Glycemic ControlResults for AKIAH, BAUCH (MRN [id number]) as of 10/10/2017 15:20  Ref. Range 10/10/2017 07:44 10/10/2017 11:23 10/10/2017 11:25 10/10/2017 12:24  Glucose-Capillary Latest Ref Range: 65 - 99 mg/dL 352 (H) 443 (H) 411 (H) 387 (H)    Diabetes history: Type 2 DM Outpatient Diabetes medications: None- patient has been off of medication for over a year Current orders for Inpatient glycemic control:  Novolog resistant tid with meals and HS, Levemir 12 units q HS  Inpatient Diabetes Program Recommendations:    Note that blood sugars very elevated.  Discussed with RN.  Consider increasing frequency of Novolog correction to q 4 hours and increase Levemir to 20 units daily (start now).  Text page sent to NP. If blood sugars remain >400 mg/dL, may consider IV insulin.    Talked with patient regarding diabetes.  She states that she used to be seen at [facility] however she has not been for more that a year sue to work.  She states that she used to work at [employer] however she now works at [employer].  She used to have a meter however both her sister and son are using her meter at this time.  A1C indicates that average blood sugar approximately 204 mg/dL.  Based on A1C she may be able to be only on oral medications for DM at d/c. Patient does not seem to understand that DM is a contributor to her heart problems?  She states that she tries to exercise but her left leg is very painful.  Currently patient does not have insurance, so this also may be a barrier to follow-up care.  Will order Living well with DM  booklet for patient and follow-up on 3/14.    Thanks  Adah Perl, RN, BC-ADM Inpatient Diabetes Coordinator Pager [phone]

## 2017-10-10 NOTE — Interval H&P Note (Signed)
History and Physical Interval Note:  10/10/2017 12:54 AM  Amy Chaney  has presented today for surgery, with the diagnosis of Inferior-stemi.  Amy Chaney is a 47 year old woman with a history of CTO PCI to the RCA back in July 2014 with 2 overlapping Promus she was last seen first by Amy Chaney and then Amy Dopp, PA DES stents 3.0 mm 38 mm and 3.5 mm x 105mm).  2 months later she had subacute stent thrombosis because of failure to continue to adapt.  She had treatment of the distal lesion with a overlapping stent (3.0 mm x 20 mm). In June and July 2017, she was seen by first Amy Chaney then Amy Dopp, PA for chest discomfort that was evaluated with a Myoview showing reduced ejection fraction.  The plan was the best I understand for her to go for heart catheterization.  However, this never occurred and she was lost to follow-up with cardiology since July 2017.  Prior to her being seen by [facility],-heart care, she was a patient of Dr. Einar Chaney who performed her stents in the past.  She apparently has run out of all of her medications including her diabetes medications and her antihypertensives/cardiac medications.  This is roughly all year ago.  She now presents with acute onset dyspnea that began roughly 9:00 tonight associated with chest pain.  EMS was called when she arrived to the emergency room at roughly 12 AM today 10/10/2017.   she was apparently treated by EMS of it for her dyspnea with with a nebulizer treatment that actually made her symptoms worse.  Upon my evaluation in the ER her chest pain and reduce from 8-10/10 down to only 3 out of 10 and upon arrival to the Cath Lab it is 0 out of 10, however she remains dyspneic and is coughing.  Her chest x-ray did show evidence of pulmonary edema.  She is being brought to the Cath Lab for emergent cardiac catheterization.  Emergency consent implied.    The various methods of treatment have been discussed with the patient and family. After consideration  of risks, benefits and other options for treatment, the patient has consented to  Procedure(s): LEFT HEART CATH AND CORONARY ANGIOGRAPHY (N/A) as a surgical intervention .  The patient's history has been reviewed, patient examined, no change in status, stable for surgery.  I have reviewed the patient's chart and labs.  Questions were answered to the patient's satisfaction.     Amy Chaney

## 2017-10-10 NOTE — ED Notes (Signed)
Code Stemi 012

## 2017-10-10 NOTE — Progress Notes (Signed)
Stopped in to visit with this patient this morning while rounding the floor.  Patient states she feels somewhat better than she did when she arrived.  We prayed together.  Will co   10/10/17 1116  Clinical Encounter Type  Visited With Patient  Visit Type Initial;Spiritual support  Spiritual Encounters  Spiritual Needs Prayer  nue to follow as needed.

## 2017-10-10 NOTE — Progress Notes (Addendum)
   Notified by RN that patient complaining of 10/10 chest pain. She states the pain awoke her from sleep. Described as 10/10 sharp substernal pain similar to the pain she experienced with her MI overnight. She is s/p LHC with 90% stenosis of proximal and mid LAD, managed with 3 overlapping stents. She has been on aggrastat for thrombus burden, as well as a nitroglycerin gtt. EKG with NSR and non-specific T wave abnormalities in III, aVF, V1, and V2 - compared to EKG performed at 7am this morning, no significant change. Patient was given IV fentanyl for pain and her symptoms improved. Dr. Gwenlyn Found reviewed EKG. Will plan to monitor for now with low threshold for repeat cath if CP reoccurs. Dr. Harl Bowie to sign out to night team.   Roby Lofts, PA-C [phone]  Agree with noted by Roby Lofts [facility]  Patient have recurrent chest pain which awakened her from sleep yesterday afternoon. Her EKG showed no significant changes. She had evolution of her anterior STEMI from the evening before. She had remained on Aggrastat at that time. She was given fentanyl and her symptoms subsided. Her troponin continued to rise from the 3-7 level. We will continue to cycle her enzymes. She will need relook angiography if she has recurrent chest pain either off the Aggrastat or when her high doses of IV nitroglycerin glycerin are weaned off.  Lorretta Harp, M.D., [place], [facility], [place] [address] [place], [place]  [id number]  [phone] 10/11/2017 8:09 AM

## 2017-10-11 ENCOUNTER — Inpatient Hospital Stay (HOSPITAL_COMMUNITY): Payer: Self-pay

## 2017-10-11 DIAGNOSIS — Z955 Presence of coronary angioplasty implant and graft: Secondary | ICD-10-CM

## 2017-10-11 DIAGNOSIS — I5042 Chronic combined systolic (congestive) and diastolic (congestive) heart failure: Secondary | ICD-10-CM

## 2017-10-11 DIAGNOSIS — I340 Nonrheumatic mitral (valve) insufficiency: Secondary | ICD-10-CM

## 2017-10-11 LAB — CBC WITH DIFFERENTIAL/PLATELET
BASOS ABS: 0 10*3/uL (ref 0.0–0.1)
BASOS PCT: 0 %
Eosinophils Absolute: 0.1 10*3/uL (ref 0.0–0.7)
Eosinophils Relative: 1 %
HEMATOCRIT: 33.7 % — AB (ref 36.0–46.0)
HEMOGLOBIN: 10.9 g/dL — AB (ref 12.0–15.0)
Lymphocytes Relative: 19 %
Lymphs Abs: 3.3 10*3/uL (ref 0.7–4.0)
MCH: 26.1 pg (ref 26.0–34.0)
MCHC: 32.3 g/dL (ref 30.0–36.0)
MCV: 80.6 fL (ref 78.0–100.0)
MONOS PCT: 5 %
Monocytes Absolute: 1 10*3/uL (ref 0.1–1.0)
NEUTROS ABS: 13.4 10*3/uL — AB (ref 1.7–7.7)
NEUTROS PCT: 75 %
Platelets: 358 10*3/uL (ref 150–400)
RBC: 4.18 MIL/uL (ref 3.87–5.11)
RDW: 13.7 % (ref 11.5–15.5)
WBC: 17.7 10*3/uL — AB (ref 4.0–10.5)

## 2017-10-11 LAB — TROPONIN I
TROPONIN I: 6.96 ng/mL — AB (ref <0.03)
TROPONIN I: 9.77 ng/mL — AB (ref <0.03)
Troponin I: 8.91 ng/mL (ref <0.03)
Troponin I: 11.15 ng/mL (ref <0.03)

## 2017-10-11 LAB — BASIC METABOLIC PANEL
ANION GAP: 9 (ref 5–15)
BUN: 12 mg/dL (ref 6–20)
CALCIUM: 8.5 mg/dL — AB (ref 8.9–10.3)
CO2: 23 mmol/L (ref 22–32)
Chloride: 103 mmol/L (ref 101–111)
Creatinine, Ser: 0.71 mg/dL (ref 0.44–1.00)
Glucose, Bld: 221 mg/dL — ABNORMAL HIGH (ref 65–99)
Potassium: 3.5 mmol/L (ref 3.5–5.1)
SODIUM: 135 mmol/L (ref 135–145)

## 2017-10-11 LAB — GLUCOSE, CAPILLARY
GLUCOSE-CAPILLARY: 193 mg/dL — AB (ref 65–99)
GLUCOSE-CAPILLARY: 173 mg/dL — AB (ref 65–99)
GLUCOSE-CAPILLARY: 158 mg/dL — AB (ref 65–99)
Glucose-Capillary: 154 mg/dL — ABNORMAL HIGH (ref 65–99)
Glucose-Capillary: 230 mg/dL — ABNORMAL HIGH (ref 65–99)
Glucose-Capillary: 209 mg/dL — ABNORMAL HIGH (ref 65–99)
Glucose-Capillary: 188 mg/dL — ABNORMAL HIGH (ref 65–99)

## 2017-10-11 LAB — ECHOCARDIOGRAM COMPLETE
HEIGHTINCHES: 69 in
Weight: 3037.06 oz

## 2017-10-11 MED ORDER — LIVING WELL WITH DIABETES BOOK
Freq: Once | Status: AC
  Administered 2017-10-11: 16:00:00
  Filled 2017-10-11: qty 1

## 2017-10-11 MED ORDER — LISINOPRIL 2.5 MG PO TABS
2.5000 mg | ORAL_TABLET | Freq: Every day | ORAL | Status: DC
  Administered 2017-10-11: 2.5 mg via ORAL
  Filled 2017-10-11: qty 1

## 2017-10-11 MED FILL — Heparin Sodium (Porcine) 2 Unit/ML in Sodium Chloride 0.9%: INTRAMUSCULAR | Qty: 1000 | Status: AC

## 2017-10-11 MED FILL — Lidocaine HCl Local Inj 1%: INTRAMUSCULAR | Qty: 20 | Status: AC

## 2017-10-11 NOTE — Progress Notes (Signed)
Inpatient Diabetes Program Recommendations  AACE/ADA: New Consensus Statement on Inpatient Glycemic Control (2015)  Target Ranges:  Prepandial:   less than 140 mg/dL      Peak postprandial:   less than 180 mg/dL (1-2 hours)      Critically ill patients:  140 - 180 mg/dL   Lab Results  Component Value Date   GLUCAP 230 (H) 10/11/2017   HGBA1C 8.6 (H) 10/10/2017    Review of Glycemic ControlResults for Amy Chaney, Amy Chaney (MRN [id number]) as of 10/11/2017 16:21  Ref. Range 10/10/2017 23:45 10/11/2017 03:36 10/11/2017 07:44 10/11/2017 12:20  Glucose-Capillary Latest Ref Range: 65 - 99 mg/dL 259 (H) 193 (H) 173 (H) 230 (H)    Diabetes history: Type 2 DM Outpatient Diabetes medications: None- patient has been off of medication for over a year Current orders for Inpatient glycemic control:  Novolog resistant q 4 hours, Levemir 20 units q HS  Inpatient Diabetes Program Recommendations:    Please consider increasing Levemir to 30 units q HS.  Also consider adding Novolog meal coverage 4 units tid with meals (hold if patient eats less than 50%).  Spoke with patient again regarding home DM management.  She states that she is currently under a lot of stress with living arrangements.  She currently sleeps on the floor at her sisters.  Her son also lives with them and he has type 1 DM.  She is applying for housing but is not sure that she will qualify.  She is also very concerned that she will not be able to work her current job due to the high physical demands.  She seems to understand DM management and why it is so important to manage blood sugars, however she has not been able to financially go to MD appointments and was lost to follow-up.  We discussed A1C.  She likely will be able to d/c on oral medications for DM based on A1C.  We discussed the vital importance of follow-up.  She was tearful regarding her current stressors and $ issues.  Provided support.  She has Living well with  DM booklet at bedside.     Thanks,  Adah Perl, RN, BC-ADM Inpatient Diabetes Coordinator Pager [phone] (8a-5p)

## 2017-10-11 NOTE — Progress Notes (Addendum)
Progress Note  Patient Name: Amy Chaney Date of Encounter: 10/11/2017  Primary Cardiologist: Dr. Gwenlyn Found  Subjective   Patient continues to demonstrate slow to respond but is alert and oriented x3 she was resting comfortably in her bed today upon entering the room.  She stated that the chest pain had initially improved but had increased overnight to 10 out of 10 and was in similar in appearance to her previous chest pain.  This did require a 58mcg dose of fentanyl to abort the pain.  She was made aware that if the pain continues she was to notify nursing staff as a repeat heart cath may need to be performed.  She is agreeable to this plan.  Inpatient Medications    Scheduled Meds: . amLODipine  5 mg Oral Daily  . aspirin  324 mg Oral Once  . aspirin EC  81 mg Oral Daily  . atorvastatin  80 mg Oral q1800  . insulin aspart  0-20 Units Subcutaneous Q4H  . insulin aspart  0-5 Units Subcutaneous QHS  . insulin detemir  20 Units Subcutaneous QHS  . metoprolol tartrate  25 mg Oral BID  . prasugrel  10 mg Oral Daily  . sodium chloride flush  3 mL Intravenous Q12H   Continuous Infusions: . sodium chloride 20 mL/hr at 10/10/17 2000  . sodium chloride    . nitroGLYCERIN 200 mcg/min (10/11/17 0600)   PRN Meds: sodium chloride, acetaminophen, nitroGLYCERIN, ondansetron (ZOFRAN) IV, sodium chloride flush   Vital Signs    Vitals:   10/11/17 0530 10/11/17 0600 10/11/17 0630 10/11/17 0700  BP: 123/66 132/72 (!) 169/85 (!) 142/81  Pulse: 96 85 (!) 118 91  Resp: (!) 27 (!) 24 (!) 24 (!) 23  Temp:    98.1 F (36.7 C)  TempSrc:    Oral  SpO2: 100% 100% 100% 99%  Weight:      Height:        Intake/Output Summary (Last 24 hours) at 10/11/2017 0802 Last data filed at 10/11/2017 0600 Gross per 24 hour  Intake 2082.55 ml  Output 875 ml  Net 1207.55 ml   Filed Weights   10/09/17 2356 10/10/17 0400  Weight: 111.1 kg (245 lb) 86.1 kg (189 lb 13.1 oz)    Telemetry    Normal sinus  rhythm with occasional PVCs.- Personally Reviewed  ECG    Normal sinus rhythm.  There is again ST elevation in the anteroseptal leads which is consistent with progression of a STEMI.- Personally Reviewed  Physical Exam   GEN: No acute distress.  Nondiaphoretic.  Verbal responses are again slowed patient is alert and oriented x3 Neck: No JVD Cardiac: RRR, no murmurs, rubs, or gallops.  Respiratory: Clear to auscultation bilaterally. GI: Soft, nontender, non-distended  MS: No edema; No deformity. Neuro:  Nonfocal  Psych: Normal affect   Labs    Chemistry Recent Labs  Lab 10/10/17 0416 10/10/17 0804 10/11/17 0216  NA 135 134* 135  K 3.8 4.6 3.5  CL 100* 100* 103  CO2 21* 18* 23  GLUCOSE 339* 371* 221*  BUN 12 13 12   CREATININE 0.86 0.88 0.71  CALCIUM 8.8* 8.9 8.5*  PROT 7.4  --   --   ALBUMIN 3.5  --   --   AST 23  --   --   ALT 15  --   --   ALKPHOS 52  --   --   BILITOT 0.5  --   --   GFRNONAA >  60 >60 >60  GFRAA >60 >60 >60  ANIONGAP 14 16* 9     Hematology Recent Labs  Lab 10/10/17 0003 10/10/17 0113 10/10/17 0804 10/11/17 0216  WBC 11.2*  --  17.4* 17.7*  RBC 4.83  --  4.84 4.18  HGB 12.7 13.6 13.2 10.9*  HCT 40.3 40.0 39.6 33.7*  MCV 83.4  --  81.8 80.6  MCH 26.3  --  27.3 26.1  MCHC 31.5  --  33.3 32.3  RDW 13.8  --  14.1 13.7  PLT 402*  --  337 358    Cardiac Enzymes Recent Labs  Lab 10/10/17 0416 10/10/17 1031 10/10/17 1945 10/11/17 0216  TROPONINI 0.27* 1.05* 3.26* 6.96*    Recent Labs  Lab 10/10/17 0009  TROPIPOC 0.08     BNPNo results for input(s): BNP, PROBNP in the last 168 hours.   DDimer No results for input(s): DDIMER in the last 168 hours.   Radiology    Dg Chest Portable 1 View  Result Date: 10/10/2017 CLINICAL DATA:  Acute onset of cough and vomiting. Generalized chest pain and shortness of breath. EXAM: PORTABLE CHEST 1 VIEW COMPARISON:  Chest radiograph performed 10/01/2017 FINDINGS: The lungs are well-aerated.  Vascular congestion is noted. Increased interstitial markings raise concern for mild interstitial edema. There is no evidence of focal opacification, pleural effusion or pneumothorax. The cardiomediastinal silhouette is borderline normal in size. No acute osseous abnormalities are seen. IMPRESSION: Vascular congestion. Increased interstitial markings raise concern for mild interstitial edema. Electronically Signed   By: Garald Balding M.D.   On: 10/10/2017 01:02    Cardiac Studies   Left Heart Cath 10/10/2017-->  Culprit lesion is bifurcation LAD-D2 ([place] 1, 1, 1)  Prox LAD to Mid LAD lesion is 90% stenosed.  3 overlapping Drug-Eluting Stents were successfully placed using a STENT SYNERGY DES 3.5X32 (p),STENT SYNERGY DES 3.5X 8 (m), STENT SYNERGY DES 3.5X28 (d)  Post intervention, there is a 0% residual stenosis.  Ost 2nd Diag lesion is 90% stenosed. Post intervention, there is a 95% residual stenosis.  A drug-eluting stent was successfully placed using a STENT SYNERGY DES 2.5X24. However after crush technique was performed, there was plaque/thrombus shift into the ostium of this vessel that could not be rewired. Leaving the vessel subtotally occluded.  Prox RCA to Mid RCA 3 overlapping DES stents -- 5% stenosed. Mid RCA to Dist RCA lesion is 40% stenosed.  Ost 2nd Mrg to 2nd Mrg lesion is 30% stenosed.  LV end diastolic pressure is severely elevated.  Actually patent stents throughout the RCA with moderate disease distal to the stent.  Culprit lesion is bifurcation LAD-D2 Ladona Mow 1, 1, 1)-after initial balloon angioplasty became clear that this was a thrombotic lesion) --ballooning both lesions led to temporary 100% closure of the LAD followed by 100% closure of the diagonal. There was an apparent dissection associated with this lesion that was localized and covered with a stents  Patient Profile     47 y.o. female who presented to the emergency department with chest pain and  dyspnea.  She is a prominent past medical history notable for significant coronary artery disease s/p CTO PCI to the RCA July 2014.  2 months following stent placement she has subacute stent thrombosis due to failure to adapt.  Unfortunately she failed to continue to follow-up with cardiology since July 2017 and has been noncompliant with medications for greater than 1 year.  She reportedly ran out of her medications including her diabetes medications,  antihypertensives, and cardiac medications at this timeframe.  The evening of her admission she developed sudden onset dyspnea and chest pain also 9 PM EMS S was called she was brought to the ED where a STEMI was diagnosed.  She was taken to the Cath Lab emergently with multiple stents placed in the LAD (please see Cath Lab note above).  Assessment & Plan    STEMI: Patient underwent left heart catheterization with notable lesion at the bifurcation of the LAD and second diagonal.  Stenting of the LAD occurred following concern for apparent dissection associated with the lesion it was thrombotic in nature.  Chest pain had initially improved but recurred again overnight. She was given 25 mcg of fentanyl which resolved the pain.  She  be taken for repeat cath for pain and can occurs she has a secondary observable rising troponins not consistent with normal progression. -Continue to trend troponins x3 ordered again today given continued rise in initial values to peak of approximately 7 - Echocardiogram ordered for today -EKG daily for progression T wave - Continue aspirin and Aggrastat -Continue atorvastatin -Continue nitroglycerin sublingual as needed for chest pain -Continue to wean nitroglycerin GTT -Continue metoprolol tartrate 20 mg twice daily -Initiate Lisinopril 2.5mg  daily  Insulin-dependent diabetes: Continue SSI resistant scale Q4 hours. Levemir 20 units nightly with bedtime SSI coverage ordered as per Diabetes coordinator recommendations  The  above changes have resulted in improved glycemic control. Will continue to monitor  Essential HTN: Blood pressure has been stable since admission howbeit slightly elevated. -Continue amlodipine 5mg  daily -Lisinopril 2.5mg  daily  For questions or updates, please contact [place] Please consult [email or web address] for contact info under Cardiology/STEMI.   Signed, Kathi Ludwig, MD  10/11/2017, 8:02 AM   [phone]  Agree was noted by Dr. Berline Lopes  Ms. Allan said no further chest pain since last night. Her enzymes continue to rise. EKG did not show acute changes. She is on 200 g of IV nitroglycerin and complains of a headache. We will wean this. Her Aggrastat was discontinued last night. Her exam is benign. She will remain in units which today and get out of bed into a chair. If she remains pain-free on IV nitroglycerin she will be transferred to telemetry tomorrow with anticipation of discharge over the weekend. If she has recurrent chest pain she will need to be looked coronary angiography.  Lorretta Harp, M.D., [place], [facility], [place] [address] [place], [place]  [address]  [phone] 10/11/2017 10:38 AM

## 2017-10-12 DIAGNOSIS — Z9119 Patient's noncompliance with other medical treatment and regimen: Secondary | ICD-10-CM

## 2017-10-12 LAB — BASIC METABOLIC PANEL
ANION GAP: 8 (ref 5–15)
BUN: 9 mg/dL (ref 6–20)
CO2: 24 mmol/L (ref 22–32)
Calcium: 8.8 mg/dL — ABNORMAL LOW (ref 8.9–10.3)
Chloride: 106 mmol/L (ref 101–111)
Creatinine, Ser: 0.76 mg/dL (ref 0.44–1.00)
GFR calc Af Amer: >60 mL/min (ref >60)
Glucose, Bld: 122 mg/dL — ABNORMAL HIGH (ref 65–99)
Potassium: 3.5 mmol/L (ref 3.5–5.1)
SODIUM: 138 mmol/L (ref 135–145)

## 2017-10-12 LAB — CBC WITH DIFFERENTIAL/PLATELET
BASOS ABS: 0.1 10*3/uL (ref 0.0–0.1)
Basophils Relative: 0 %
Eosinophils Absolute: 0.2 10*3/uL (ref 0.0–0.7)
Eosinophils Relative: 2 %
HEMATOCRIT: 34.7 % — AB (ref 36.0–46.0)
HEMOGLOBIN: 11 g/dL — AB (ref 12.0–15.0)
Lymphocytes Relative: 29 %
Lymphs Abs: 4.3 10*3/uL — ABNORMAL HIGH (ref 0.7–4.0)
MCH: 25.9 pg — ABNORMAL LOW (ref 26.0–34.0)
MCHC: 31.7 g/dL (ref 30.0–36.0)
MCV: 81.8 fL (ref 78.0–100.0)
Monocytes Absolute: 1.1 10*3/uL — ABNORMAL HIGH (ref 0.1–1.0)
Monocytes Relative: 7 %
NEUTROS ABS: 9.1 10*3/uL — AB (ref 1.7–7.7)
Neutrophils Relative %: 62 %
Platelets: 341 10*3/uL (ref 150–400)
RBC: 4.24 MIL/uL (ref 3.87–5.11)
RDW: 14 % (ref 11.5–15.5)
WBC: 14.8 10*3/uL — AB (ref 4.0–10.5)

## 2017-10-12 LAB — GLUCOSE, CAPILLARY
GLUCOSE-CAPILLARY: 253 mg/dL — AB (ref 65–99)
Glucose-Capillary: 130 mg/dL — ABNORMAL HIGH (ref 65–99)
Glucose-Capillary: 174 mg/dL — ABNORMAL HIGH (ref 65–99)
Glucose-Capillary: 193 mg/dL — ABNORMAL HIGH (ref 65–99)

## 2017-10-12 LAB — MAGNESIUM: MAGNESIUM: 1.8 mg/dL (ref 1.7–2.4)

## 2017-10-12 MED ORDER — LISINOPRIL 5 MG PO TABS
5.0000 mg | ORAL_TABLET | Freq: Every day | ORAL | Status: DC
  Administered 2017-10-12: 5 mg via ORAL
  Filled 2017-10-12: qty 1

## 2017-10-12 MED ORDER — INSULIN ASPART 100 UNIT/ML ~~LOC~~ SOLN
4.0000 [IU] | Freq: Three times a day (TID) | SUBCUTANEOUS | Status: DC
  Administered 2017-10-12 – 2017-10-14 (×5): 4 [IU] via SUBCUTANEOUS

## 2017-10-12 MED ORDER — LISINOPRIL 10 MG PO TABS
10.0000 mg | ORAL_TABLET | Freq: Every day | ORAL | Status: DC
  Administered 2017-10-13 – 2017-10-14 (×2): 10 mg via ORAL
  Filled 2017-10-12 (×2): qty 1

## 2017-10-12 MED ORDER — INSULIN DETEMIR 100 UNIT/ML ~~LOC~~ SOLN
25.0000 [IU] | Freq: Every day | SUBCUTANEOUS | Status: DC
  Administered 2017-10-12 – 2017-10-13 (×2): 25 [IU] via SUBCUTANEOUS
  Filled 2017-10-12 (×2): qty 0.25

## 2017-10-12 MED ORDER — POTASSIUM CHLORIDE ER 10 MEQ PO TBCR
40.0000 meq | EXTENDED_RELEASE_TABLET | Freq: Once | ORAL | Status: AC
  Administered 2017-10-12: 40 meq via ORAL
  Filled 2017-10-12 (×2): qty 4

## 2017-10-12 MED ORDER — METOPROLOL TARTRATE 50 MG PO TABS
50.0000 mg | ORAL_TABLET | Freq: Two times a day (BID) | ORAL | Status: DC
  Administered 2017-10-12 – 2017-10-14 (×5): 50 mg via ORAL
  Filled 2017-10-12 (×5): qty 1

## 2017-10-12 MED ORDER — LISINOPRIL 5 MG PO TABS: 5.0000 mg | ORAL_TABLET | Freq: Once | ORAL | Status: DC

## 2017-10-12 MED ORDER — INSULIN ASPART 100 UNIT/ML ~~LOC~~ SOLN
0.0000 [IU] | Freq: Every day | SUBCUTANEOUS | Status: DC
  Administered 2017-10-13: 2 [IU] via SUBCUTANEOUS

## 2017-10-12 MED ORDER — INSULIN ASPART 100 UNIT/ML ~~LOC~~ SOLN
0.0000 [IU] | Freq: Three times a day (TID) | SUBCUTANEOUS | Status: DC
  Administered 2017-10-12: 11 [IU] via SUBCUTANEOUS
  Administered 2017-10-12 – 2017-10-13 (×2): 4 [IU] via SUBCUTANEOUS
  Administered 2017-10-13: 7 [IU] via SUBCUTANEOUS
  Administered 2017-10-14: 3 [IU] via SUBCUTANEOUS

## 2017-10-12 NOTE — Progress Notes (Signed)
CARDIAC REHAB PHASE I   PRE:  Rate/Rhythm: 73 SR    BP: sitting 135/73    SaO2:   MODE:  Ambulation: 620 ft   POST:  Rate/Rhythm: 99 SR    BP: sitting 157/87     SaO2:   Tolerated well. Walked one lap and then wanted to walk another, felt well. Ed completed with fairly good reception. She nodded understanding. She is working on quitting smoking and I gave her the fake cigarette and resources. She understands the importance of Effient. Needs to see CM. Will refer to [place]. 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  [place], ACSM 10/12/2017 1:58 PM

## 2017-10-12 NOTE — Progress Notes (Signed)
Pt has a tele bed available for transfer on unit [address], room 6E10. Attempted to give report, receiving RN unavailable at the moment, number provided to unit secretary to have RN return call for report.

## 2017-10-12 NOTE — Progress Notes (Addendum)
Progress Note  Patient Name: Amy Chaney Date of Encounter: 10/12/2017  Primary Cardiologist: No primary care provider on file.   Subjective   The patient was lying in bed today smiling upon entering the room.  She stated that she felt much better and has not had recurrence of her pain overnight.  Which nitroglycerin drip is off her headache had improved was still mildly present.  The patient stated that she was in agreement with the plan to be transferred to to a telemetry bed to get rehabilitation to be discharged home shortly.  In addition the patient again confirmed that she would discontinue smoking, she would be compliant with her medications, and would begin walking on a daily basis.  She understands the importance of these measures in preventing additional events as she has just experienced on for this admission.  Inpatient Medications    Scheduled Meds: . amLODipine  5 mg Oral Daily  . aspirin  324 mg Oral Once  . aspirin EC  81 mg Oral Daily  . atorvastatin  80 mg Oral q1800  . insulin aspart  0-20 Units Subcutaneous Q4H  . insulin aspart  0-5 Units Subcutaneous QHS  . insulin detemir  20 Units Subcutaneous QHS  . lisinopril  2.5 mg Oral Daily  . metoprolol tartrate  25 mg Oral BID  . prasugrel  10 mg Oral Daily  . sodium chloride flush  3 mL Intravenous Q12H   Continuous Infusions: . sodium chloride Stopped (10/11/17 1201)  . sodium chloride    . nitroGLYCERIN Stopped (10/11/17 1715)   PRN Meds: sodium chloride, acetaminophen, nitroGLYCERIN, ondansetron (ZOFRAN) IV, sodium chloride flush   Vital Signs    Vitals:   10/12/17 0200 10/12/17 0300 10/12/17 0400 10/12/17 0407  BP: 131/75 130/74 (!) 157/83   Pulse: 89 83 63   Resp: (!) 26 (!) 22 (!) 25   Temp:    98.9 F (37.2 C)  TempSrc:    Oral  SpO2: 98% 99% 100%   Weight:      Height:        Intake/Output Summary (Last 24 hours) at 10/12/2017 0636 Last data filed at 10/11/2017 1715 Gross per 24 hour    Intake 649.77 ml  Output 250 ml  Net 399.77 ml   Filed Weights   10/09/17 2356 10/10/17 0400  Weight: 111.1 kg (245 lb) 86.1 kg (189 lb 13.1 oz)    Telemetry    Normal sinus rhythm with multiple PVCs and a single missed beat- Personally Reviewed  ECG    NSR with ST elevation in the anteroseptal leads which is consistent with progression of her STEMI similar to the previous evaluation- Personally Reviewed  Physical Exam   GEN: No acute distress.  She remains nondiaphoretic.  Verbal responses are much improved and appropriate today.  She is alert and oriented x3 Neck: No JVD Cardiac: RRR, no murmurs, rubs, or gallops.  Respiratory: Clear to auscultation bilaterally. GI: Soft, nontender, non-distended  MS: No edema; No deformity. Neuro:  Nonfocal  Psych: Normal affect   Labs    Chemistry Recent Labs  Lab 10/10/17 0416 10/10/17 0804 10/11/17 0216 10/12/17 0311  NA 135 134* 135 138  K 3.8 4.6 3.5 3.5  CL 100* 100* 103 106  CO2 21* 18* 23 24  GLUCOSE 339* 371* 221* 122*  BUN 12 13 12 9   CREATININE 0.86 0.88 0.71 0.76  CALCIUM 8.8* 8.9 8.5* 8.8*  PROT 7.4  --   --   --  ALBUMIN 3.5  --   --   --   AST 23  --   --   --   ALT 15  --   --   --   ALKPHOS 52  --   --   --   BILITOT 0.5  --   --   --   GFRNONAA >60 >60 >60 >60  GFRAA >60 >60 >60 >60  ANIONGAP 14 16* 9 8     Hematology Recent Labs  Lab 10/10/17 0804 10/11/17 0216 10/12/17 0311  WBC 17.4* 17.7* 14.8*  RBC 4.84 4.18 4.24  HGB 13.2 10.9* 11.0*  HCT 39.6 33.7* 34.7*  MCV 81.8 80.6 81.8  MCH 27.3 26.1 25.9*  MCHC 33.3 32.3 31.7  RDW 14.1 13.7 14.0  PLT 337 358 341    Cardiac Enzymes Recent Labs  Lab 10/11/17 0216 10/11/17 0920 10/11/17 1457 10/11/17 1952  TROPONINI 6.96* 8.91* 11.15* 9.77*    Recent Labs  Lab 10/10/17 0009  TROPIPOC 0.08     BNPNo results for input(s): BNP, PROBNP in the last 168 hours.   DDimer No results for input(s): DDIMER in the last 168 hours.    Radiology    No results found.  Cardiac Studies   ECHO 10/11/2017 - Left ventricle: The cavity size was normal. Wall thickness was   increased in a pattern of mild LVH. The estimated ejection   fraction was 35%. The anteroseptal, anterior, and anterolateral   walls were hypokinetic. Features are consistent with a   pseudonormal left ventricular filling pattern, with concomitant   abnormal relaxation and increased filling pressure (grade 2   diastolic dysfunction). - Aortic valve: There was no stenosis. - Mitral valve: There was moderate regurgitation. - Left atrium: The atrium was moderately dilated. - Right ventricle: The cavity size was normal. Systolic function   was normal. - Pulmonary arteries: PA peak pressure: 36 mm Hg (S). - Inferior vena cava: The vessel was normal in size. The   respirophasic diameter changes were in the normal range (>= 50%),   consistent with normal central venous pressure.  Impressions: - Normal LV size with mild LV hypertrophy. EF 35% with wall motion   abnormalities as noted above. Moderate diastolic dysfunction.   Normal RV size and systolic function. Moderate mitral   regurgitation.  Left Heart Cath 10/10/2017-->  Culprit lesion is bifurcation LAD-D2 ([place] 1, 1, 1)  Prox LAD to Mid LAD lesion is 90% stenosed.  3 overlapping Drug-Eluting Stents were successfully placed using a STENT SYNERGY DES 3.5X32 (p),STENT SYNERGY DES 3.5X 8 (m), STENT SYNERGY DES 3.5X28 (d)  Post intervention, there is a 0% residual stenosis.  Ost 2nd Diag lesion is 90% stenosed. Post intervention, there is a 95% residual stenosis.  A drug-eluting stent was successfully placed using a STENT SYNERGY DES 2.5X24. However after crush technique was performed, there was plaque/thrombus shift into the ostium of this vessel that could not be rewired. Leaving the vessel subtotally occluded.  Prox RCA to Mid RCA 3 overlapping DES stents -- 5% stenosed. Mid RCA to Dist  RCA lesion is 40% stenosed.  Ost 2nd Mrg to 2nd Mrg lesion is 30% stenosed.  LV end diastolic pressure is severely elevated.  Actually patent stents throughout the RCA with moderate disease distal to the stent.  Culprit lesion is bifurcation LAD-D2 Ladona Mow 1, 1, 1)-after initial balloon angioplasty became clear that this was a thrombotic lesion) --ballooning both lesions led to temporary 100% closure of the LAD followed  by 100% closure of the diagonal. There was an apparent dissection associated with this lesion that was localized and covered with a stents  Patient Profile     47 y.o. female femalewho presented to the emergency department with chest pain and dyspnea. She is a prominentpast medical history notable for significant coronary artery disease s/pCTO PCI to the RCA July 2014.2 months following stent placement she experienced a subacute stent thrombosis due to failure to adapt. Unfortunately, she failed to continue to follow-up with cardiology since July 2017 and has been noncompliant with medications for greater than 1 year. She reportedly ran out of her medications including her diabetes medications, antihypertensives, and cardiac medications at that time. The evening of her admission she developed sudden onset dyspnea and chest pain.  Around 9 PM EMS was called and she was brought to the ED where aSTEMI was diagnosed.She was taken to the Cath Lab emergently with multiple stents placed in the LAD (please see Cath Lab note above).  Foley catheter was concerning for thrombotic lesion with ballooning of both degenerative to improve 100% closure of the LAD and the diagonal that was involved.  There is concern for dissection as well.  The patient has been informed that she will need to be out of work for approximately 2 weeks with no stressful activity or strenuous activity given the severity of her injury.  Assessment & Plan    STEMI: Patient underwent left heart  catheterization with notable lesion of the bifurcation of the LAD and second diagonal.  Stenting of the LAD occurred concerning for apparent dissection associated with a lesion that was thrombotic in nature.  Chest pain had initially improved but recurred again overnight following the initial cath.  This pain resolved with 25 mcg of fentanyl.  There is a low threshold to repeat cardiac catheterization in the chest pain recurs.  Echocardiogram demonstrated LV EF 35% and grade 2 diastolic dysfunction and Normal LV size with mild LV hypertrophy.  Moderate diastolic dysfunction. Normal RV size and systolic function. Moderate mitral regurgitation. -Troponin trend has peaked at 11.15 and began to decrease with most recent being 9.77.  We will stop trending -She was initiated on low-dose of lisinopril 10 mg daily -Continue aspirin and Effient -Continue atorvastatin 80mg  daily -Continue nitroglycerin sublingual as needed chest pain -Continue metoprolol tartrate 50 mg twice daily from 25 mg twice daily which can be titrated up as indicated -Continue to wean from the nitroglycerin GTT - Discontinue amlodipine given the patient's LVEF of 35% -Plan to transfer to telemetry of chest pain has resolved  Insulin dependent diabetes: Continue sliding-scale resistant every 4 hours Continue 4 units of insulin with meals Levemir increased from 20-25 units nightly.  Essential hypertension: Blood pressure has been stable since admission but is increasing significantly with decreasing nitro drip.  -Continue on amlodipine 5 mg daily -Continue lisinopril 5 mg daily -Continue to monitor  Hypokalemia hypomagnesemia: Potassium 3.5 we will replete with p.o. 40 mEq most recent magnesium 1.6 on 3/13 which was repleted with 2 g of IV magnesium. -Repeat mag and replace as indicated - Cater 40 mEq x1 dose  For questions or updates, please contact [facility] Please consult [email or web address] for contact info under  Cardiology/STEMI.   Please see attending note for additional current assessment and plan.    Signed, Kathi Ludwig, MD  10/12/2017, 6:36 AM   [phone]  Agree with note by Dr. Berline Lopes  Patient pain-free since yesterday. She is off Aggrastat and IV nitroglycerin. Her  ejection fraction by 2-D echo performed yesterday was 35% with wall motion abnormalities in the LAD distribution. She is on lisinopril and beta blockade without symptoms of heart failure at this time. Her exam is benign. We will transfer to telemetry, ambulate and probably discharge in the next 24-48 hours on dual antiplatelet therapy.  Lorretta Harp, M.D., [place], [facility], [place] [address] [place], [place]  [address]  [phone] 10/12/2017 11:38 AM

## 2017-10-12 NOTE — Progress Notes (Signed)
Hypoglycemic Event  CBG: 52  Treatment: 15 GM carbohydrate snack  Symptoms: None  Follow-up CBG: Time:1715 CBG Result:160  Possible Reasons for Event: Unknown  Comments/MD notified not needed, glucose came up, pt asymptomatic     Talena Neira E

## 2017-10-12 NOTE — Progress Notes (Signed)
Report given to RN Gabe on unit 6 [place], pt eating dinner, will transfer via wheelchair with belongings when she is done eating.

## 2017-10-12 NOTE — Progress Notes (Signed)
Pt transferred to [place] via wheelchair with belongings.

## 2017-10-13 LAB — GLUCOSE, CAPILLARY
GLUCOSE-CAPILLARY: 212 mg/dL — AB (ref 65–99)
Glucose-Capillary: 170 mg/dL — ABNORMAL HIGH (ref 65–99)
Glucose-Capillary: 80 mg/dL (ref 65–99)
Glucose-Capillary: 245 mg/dL — ABNORMAL HIGH (ref 65–99)

## 2017-10-13 LAB — CBC WITH DIFFERENTIAL/PLATELET
BASOS PCT: 0 %
Basophils Absolute: 0.1 10*3/uL (ref 0.0–0.1)
Eosinophils Absolute: 0.4 10*3/uL (ref 0.0–0.7)
Eosinophils Relative: 2 %
HCT: 35.8 % — ABNORMAL LOW (ref 36.0–46.0)
Hemoglobin: 11 g/dL — ABNORMAL LOW (ref 12.0–15.0)
Lymphocytes Relative: 26 %
Lymphs Abs: 4.3 10*3/uL — ABNORMAL HIGH (ref 0.7–4.0)
MCH: 25.3 pg — ABNORMAL LOW (ref 26.0–34.0)
MCHC: 30.7 g/dL (ref 30.0–36.0)
MCV: 82.5 fL (ref 78.0–100.0)
MONO ABS: 1.1 10*3/uL — AB (ref 0.1–1.0)
MONOS PCT: 7 %
Neutro Abs: 10.5 10*3/uL — ABNORMAL HIGH (ref 1.7–7.7)
Neutrophils Relative %: 65 %
Platelets: 343 10*3/uL (ref 150–400)
RBC: 4.34 MIL/uL (ref 3.87–5.11)
RDW: 14.2 % (ref 11.5–15.5)
WBC: 16.4 10*3/uL — ABNORMAL HIGH (ref 4.0–10.5)

## 2017-10-13 LAB — BASIC METABOLIC PANEL
ANION GAP: 8 (ref 5–15)
BUN: 15 mg/dL (ref 6–20)
CALCIUM: 8.9 mg/dL (ref 8.9–10.3)
CO2: 22 mmol/L (ref 22–32)
Chloride: 107 mmol/L (ref 101–111)
Creatinine, Ser: 0.77 mg/dL (ref 0.44–1.00)
GFR calc Af Amer: >60 mL/min (ref >60)
GFR calc non Af Amer: >60 mL/min (ref >60)
GLUCOSE: 166 mg/dL — AB (ref 65–99)
Potassium: 4.1 mmol/L (ref 3.5–5.1)
Sodium: 137 mmol/L (ref 135–145)

## 2017-10-13 NOTE — Progress Notes (Addendum)
CARDIAC REHAB PHASE I   PRE:  Rate/Rhythm: 63  BP:  Sitting: 153/88       MODE:  Ambulation: 600 ft   POST:  Rate/Rhythm: 82  BP:  Sitting: 149/91    Patient ambulated independently with no complaints. Assisted patient back into bed.  [place], [place] 10/13/2017 1:41 PM

## 2017-10-13 NOTE — Progress Notes (Signed)
Subjective:  Transferred up to floor.  Currently lying in bed.  No chest pain or shortness of breath.  Objective:  Vital Signs in the last 24 hours: BP 139/69   Pulse 82   Temp 98.6 F (37 C) (Oral)   Resp 20   Ht 5\' 9"  (1.753 m)   Wt 101.5 kg (223 lb 12.8 oz)   LMP 10/20/2015   SpO2 100%   BMI 33.05 kg/m   Physical Exam: As an obese female in no acute distress Lungs:  Clear Cardiac:  Regular rhythm, normal S1 and S2, no S3 Extremities:  No edema present  Intake/Output from previous day: 03/15 0701 - 03/16 0700 In: 480 [P.O.:480] Out: -   Weight Filed Weights   10/09/17 2356 10/10/17 0400 10/13/17 0536  Weight: 111.1 kg (245 lb) 86.1 kg (189 lb 13.1 oz) 101.5 kg (223 lb 12.8 oz)    Lab Results: Basic Metabolic Panel: Recent Labs    10/12/17 0311 10/13/17 0416  NA 138 137  K 3.5 4.1  CL 106 107  CO2 24 22  GLUCOSE 122* 166*  BUN 9 15  CREATININE 0.76 0.77   CBC: Recent Labs    10/11/17 0216 10/12/17 0311  WBC 17.7* 14.8*  NEUTROABS 13.4* 9.1*  HGB 10.9* 11.0*  HCT 33.7* 34.7*  MCV 80.6 81.8  PLT 358 341   Cardiac Panel (last 3 results) Recent Labs    10/11/17 0920 10/11/17 1457 10/11/17 1952  TROPONINI 8.91* 11.15* 9.77*    Telemetry: Normal sinus rhythm  Assessment/Plan:  1.  Anterior STEMI 2.  Diabetes mellitus insulin-dependent 3.  Hypertension  Recommendations:  Increase activity and have cardiac rehabilitation see the patient.  Ambulate in hall.  If stable discharge in morning.     Kerry Hough  MD [facility]  10/13/2017, 10:22 AM

## 2017-10-13 NOTE — Progress Notes (Signed)
CCMD reported pt had a 7 beat run of [place] at 0325. Pt was found asleep and easy to arouse. Pt denies CP or any other cardiac related symptoms. Will continue to monitor for recurrent runs.  Jacqlyn Larsen, RN

## 2017-10-14 LAB — BASIC METABOLIC PANEL
ANION GAP: 8 (ref 5–15)
BUN: 18 mg/dL (ref 6–20)
CHLORIDE: 106 mmol/L (ref 101–111)
CO2: 23 mmol/L (ref 22–32)
CREATININE: 0.72 mg/dL (ref 0.44–1.00)
Calcium: 8.8 mg/dL — ABNORMAL LOW (ref 8.9–10.3)
GFR calc non Af Amer: >60 mL/min (ref >60)
Glucose, Bld: 126 mg/dL — ABNORMAL HIGH (ref 65–99)
Potassium: 3.7 mmol/L (ref 3.5–5.1)
Sodium: 137 mmol/L (ref 135–145)

## 2017-10-14 LAB — CBC WITH DIFFERENTIAL/PLATELET
BASOS PCT: 0 %
Basophils Absolute: 0.1 10*3/uL (ref 0.0–0.1)
EOS ABS: 0.4 10*3/uL (ref 0.0–0.7)
Eosinophils Relative: 3 %
HEMATOCRIT: 36.8 % (ref 36.0–46.0)
HEMOGLOBIN: 11.6 g/dL — AB (ref 12.0–15.0)
LYMPHS ABS: 3.9 10*3/uL (ref 0.7–4.0)
Lymphocytes Relative: 28 %
MCH: 26.1 pg (ref 26.0–34.0)
MCHC: 31.5 g/dL (ref 30.0–36.0)
MCV: 82.9 fL (ref 78.0–100.0)
MONOS PCT: 4 %
Monocytes Absolute: 0.6 10*3/uL (ref 0.1–1.0)
NEUTROS PCT: 65 %
Neutro Abs: 9 10*3/uL — ABNORMAL HIGH (ref 1.7–7.7)
Platelets: 369 10*3/uL (ref 150–400)
RBC: 4.44 MIL/uL (ref 3.87–5.11)
RDW: 14.4 % (ref 11.5–15.5)
WBC: 13.9 10*3/uL — AB (ref 4.0–10.5)

## 2017-10-14 LAB — GLUCOSE, CAPILLARY: GLUCOSE-CAPILLARY: 134 mg/dL — AB (ref 65–99)

## 2017-10-14 MED ORDER — ASPIRIN EC 81 MG PO TBEC: 81.0000 mg | DELAYED_RELEASE_TABLET | Freq: Every day | ORAL | 3 refills | Status: DC

## 2017-10-14 MED ORDER — METOPROLOL SUCCINATE ER 50 MG PO TB24: 50.0000 mg | ORAL_TABLET | Freq: Two times a day (BID) | ORAL | Status: DC

## 2017-10-14 MED ORDER — ATORVASTATIN CALCIUM 20 MG PO TABS: 20.0000 mg | ORAL_TABLET | Freq: Every day | ORAL | 3 refills | Status: DC

## 2017-10-14 MED ORDER — LISINOPRIL 10 MG PO TABS: 10.0000 mg | ORAL_TABLET | Freq: Every day | ORAL | 3 refills | Status: DC

## 2017-10-14 MED ORDER — METOPROLOL SUCCINATE ER 50 MG PO TB24: 50.0000 mg | ORAL_TABLET | Freq: Two times a day (BID) | ORAL | 3 refills | Status: DC

## 2017-10-14 MED ORDER — PRASUGREL HCL 10 MG PO TABS: 10.0000 mg | ORAL_TABLET | Freq: Every day | ORAL | 3 refills | Status: DC

## 2017-10-14 MED ORDER — NITROGLYCERIN 0.4 MG SL SUBL: 0.4000 mg | SUBLINGUAL_TABLET | SUBLINGUAL | 3 refills | Status: DC | PRN

## 2017-10-14 NOTE — Progress Notes (Signed)
Subjective:  She is feeling good without shortness of breath or chest pain.  Was able to see cardiac rehabilitation and ambulate yesterday.  She works as a Secretary/administrator at [employer] and will need to be out of work for a few weeks because of her LV dysfunction.  Objective:  Vital Signs in the last 24 hours: BP 134/77   Pulse 85   Temp 98.3 F (36.8 C) (Oral)   Resp 20   Ht 5\' 9"  (1.753 m)   Wt 101.3 kg (223 lb 4.8 oz)   LMP 10/20/2015   SpO2 97%   BMI 32.98 kg/m   Physical Exam: pleasant obese female in no acute distress Lungs:  Clear Cardiac:  Regular rhythm, normal S1 and S2, no S3 Extremities:  No edema present  Intake/Output from previous day: 03/16 0701 - 03/17 0700 In: 480 [P.O.:480] Out: -   Weight Filed Weights   10/10/17 0400 10/13/17 0536 10/14/17 0541  Weight: 86.1 kg (189 lb 13.1 oz) 101.5 kg (223 lb 12.8 oz) 101.3 kg (223 lb 4.8 oz)    Lab Results: Basic Metabolic Panel: Recent Labs    10/13/17 0416 10/14/17 0349  NA 137 137  K 4.1 3.7  CL 107 106  CO2 22 23  GLUCOSE 166* 126*  BUN 15 18  CREATININE 0.77 0.72   CBC: Recent Labs    10/13/17 0416 10/14/17 0349  WBC 16.4* 13.9*  NEUTROABS 10.5* 9.0*  HGB 11.0* 11.6*  HCT 35.8* 36.8  MCV 82.5 82.9  PLT 343 369   Cardiac Panel (last 3 results) Recent Labs    10/11/17 1457 10/11/17 1952  TROPONINI 11.15* 9.77*    Telemetry: Normal sinus rhythm  Assessment/Plan:  1.  Anterior STEMI 2.  Diabetes mellitus insulin-dependent 3.  Hypertension  Recommendations:  From cardiac viewpoint okay for discharge. He is currently on lisinopril 10 mg and would recommend changing to metoprolol succinate because of LV dysfunction 50 mg twice daily.  No evidence of CHF.  Needs follow-up within the week with her [facility] cardiologist.  Continue Effient.  Discussed importance of dual antiplatelet therapy with patient.  Discussed weight loss with patient.   Kerry Hough  MD  [facility]  10/14/2017, 9:37 AM

## 2017-10-14 NOTE — Discharge Summary (Signed)
Discharge Summary    Patient ID: Amy Chaney,  MRN: [id number], DOB/AGE: 1971/06/20 47 y.o.  Admit date: 10/09/2017 Discharge date: 10/14/2017   Primary Care Provider: No primary care provider on file. Primary Cardiologist: Dr. Ellyn Hack  Discharge Diagnoses    Principal Problem:   ST elevation myocardial infarction (STEMI) [facility]) Active Problems:   Essential hypertension   Diabetes type 2, uncontrolled ([place])   Hyperlipidemia associated with type 2 diabetes mellitus ([place])   Status post primary angioplasty with coronary stent   Coronary artery disease involving native coronary artery of native heart with unstable angina pectoris (HCC)   Chronic combined systolic (congestive) and diastolic (congestive) heart failure ([place])   H/O noncompliance with medical treatment, presenting hazards to health   Smoker   Morbid obesity ([place]): BMI ~36 with HTN, HLD, CAD   Carotid artery disease (HCC)   MI, acute, non ST segment elevation ([place])   Allergies No Known Allergies   History of Present Illness     46 y.o. female femalewho presented to the emergency department with chest pain and dyspnea. She is a prominentpast medical history notable for significant coronary artery disease s/pCTO PCI to the RCA July 2014.2 months following stent placement she experienced a subacute stent thrombosis due to failure to adapt.   In June and July 2017, she was seen by first Dr. Radford Pax then Richardson Dopp, PA for chest discomfort that was evaluated with a Myoview showing reduced ejection fraction.  The plan was the best I understand for her to go for heart catheterization.  However, this never occurred and she was lost to follow-up with cardiology since July 2017.  Prior to her being seen by [facility],-heart care, she was a patient of Dr. Einar Gip who performed her stents in the past.  She failed to continue to follow-up with cardiology since July 2017 and has been noncompliant with medications for greater  than 1 year. She reportedly ran out of her medications including her diabetes medications, antihypertensives, and cardiac medications at that time. The evening of her admission she developed sudden onset dyspnea and chest pain.  Around 9 PM EMS was called. She was apparently treated by EMS of it for her dyspnea with with a nebulizer treatment that actually made her symptoms worse. She was brought to the ED where aSTEMI was diagnosed.   Hospital Course     Consultants: none  Inferior STEMI, acute systolic heart failure She was taken to the Cath Lab emergently with multiple stents placed in the LAD (please see Cath Lab note below).  Foley catheter was concerning for thrombotic lesion with ballooning of both degenerative to improve 100% closure of the LAD and the diagonal that was involved.  There is concern for dissection as well. She recovered from the procedure well. Discussed importance of DAPT with ASA and effient. Continue lisinopril and transition to toprol 50 mg BID.   Echo showed reduced EF. Pt was euvolemic on exam without congestive heart failure. She was not discharged on a diuretic, but was instructed to weight daily.  The patient has been informed that she will need to be out of work for approximately 2 weeks with no stressful activity or strenuous activity given the severity of her injury.  Medical noncompliance Encouraged her to take her medications and follow up in clinic.  Tobacco abuse Encouraged smoking cessation  DM Encouraged weight loss and walking daily. Resume home regimen at discharge.  HLD Lipitor 80 mg. Recheck FLP and LFTs in  6 weeks.  HTN Regimen as above. Keep BP log at home.   Patient seen and examined by Dr. Wynonia Lawman today and was stable for discharge. All follow up has been arranged.  _____________  Discharge Vitals Blood pressure 134/77, pulse 85, temperature 98.3 F (36.8 C), temperature source Oral, resp. rate 20, height 5' 9"  (1.753 m),  weight 223 lb 4.8 oz (101.3 kg), last menstrual period 10/20/2015, SpO2 97 %.  Filed Weights   10/10/17 0400 10/13/17 0536 10/14/17 0541  Weight: 189 lb 13.1 oz (86.1 kg) 223 lb 12.8 oz (101.5 kg) 223 lb 4.8 oz (101.3 kg)    Labs & Radiologic Studies    CBC Recent Labs    10/13/17 0416 10/14/17 0349  WBC 16.4* 13.9*  NEUTROABS 10.5* 9.0*  HGB 11.0* 11.6*  HCT 35.8* 36.8  MCV 82.5 82.9  PLT 343 242   Basic Metabolic Panel Recent Labs    10/12/17 0311 10/13/17 0416 10/14/17 0349  NA 138 137 137  K 3.5 4.1 3.7  CL 106 107 106  CO2 24 22 23   GLUCOSE 122* 166* 126*  BUN 9 15 18   CREATININE 0.76 0.77 0.72  CALCIUM 8.8* 8.9 8.8*  MG 1.8  --   --    Liver Function Tests No results for input(s): AST, ALT, ALKPHOS, BILITOT, PROT, ALBUMIN in the last 72 hours. No results for input(s): LIPASE, AMYLASE in the last 72 hours. Cardiac Enzymes Recent Labs    10/11/17 1457 10/11/17 1952  TROPONINI 11.15* 9.77*   BNP Invalid input(s): POCBNP D-Dimer No results for input(s): DDIMER in the last 72 hours. Hemoglobin A1C No results for input(s): HGBA1C in the last 72 hours. Fasting Lipid Panel No results for input(s): CHOL, HDL, LDLCALC, TRIG, CHOLHDL, LDLDIRECT in the last 72 hours. Thyroid Function Tests No results for input(s): TSH, T4TOTAL, T3FREE, THYROIDAB in the last 72 hours.  Invalid input(s): FREET3 _____________  Dg Chest 2 View  Result Date: 10/01/2017 CLINICAL DATA:  Chest pain and shortness of breath. History of CHF, coronary artery disease with stent placement. Smoker. EXAM: CHEST  2 VIEW COMPARISON:  PA and lateral chest x-ray of November 04, 2013 FINDINGS: The lungs are adequately inflated. There is no focal infiltrate. There is no pleural effusion. The heart and pulmonary vascularity are normal. The mediastinum is normal in width. The trachea is midline. The bony thorax exhibits no acute abnormality. IMPRESSION: There is no active cardiopulmonary disease.  Electronically Signed   By: David  Martinique M.D.   On: 10/01/2017 16:29   Dg Chest Portable 1 View  Result Date: 10/10/2017 CLINICAL DATA:  Acute onset of cough and vomiting. Generalized chest pain and shortness of breath. EXAM: PORTABLE CHEST 1 VIEW COMPARISON:  Chest radiograph performed 10/01/2017 FINDINGS: The lungs are well-aerated. Vascular congestion is noted. Increased interstitial markings raise concern for mild interstitial edema. There is no evidence of focal opacification, pleural effusion or pneumothorax. The cardiomediastinal silhouette is borderline normal in size. No acute osseous abnormalities are seen. IMPRESSION: Vascular congestion. Increased interstitial markings raise concern for mild interstitial edema. Electronically Signed   By: Garald Balding M.D.   On: 10/10/2017 01:02     Diagnostic Studies/Procedures    ECHO 10/11/2017 - Left ventricle: The cavity size was normal. Wall thickness was increased in a pattern of mild LVH. The estimated ejection fraction was 35%. The anteroseptal, anterior, and anterolateral walls were hypokinetic. Features are consistent with a pseudonormal left ventricular filling pattern, with concomitant abnormal relaxation and increased  filling pressure (grade 2 diastolic dysfunction). - Aortic valve: There was no stenosis. - Mitral valve: There was moderate regurgitation. - Left atrium: The atrium was moderately dilated. - Right ventricle: The cavity size was normal. Systolic function was normal. - Pulmonary arteries: PA peak pressure: 36 mm Hg (S). - Inferior vena cava: The vessel was normal in size. The respirophasic diameter changes were in the normal range (>= 50%), consistent with normal central venous pressure.  Impressions: - Normal LV size with mild LV hypertrophy. EF 35% with wall motion abnormalities as noted above. Moderate diastolic dysfunction. Normal RV size and systolic function. Moderate  mitral regurgitation.  Left Heart Cath 10/10/2017-->  Culprit lesion is bifurcation LAD-D2 ([place] 1, 1, 1)  Prox LAD to Mid LAD lesion is 90% stenosed.  3 overlapping Drug-Eluting Stents were successfully placed using a STENT SYNERGY DES 3.5X32 (p),STENT SYNERGY DES 3.5X 8 (m), STENT SYNERGY DES 3.5X28 (d)  Post intervention, there is a 0% residual stenosis.  Ost 2nd Diag lesion is 90% stenosed. Post intervention, there is a 95% residual stenosis.  A drug-eluting stent was successfully placed using a STENT SYNERGY DES 2.5X24. However after crush technique was performed, there was plaque/thrombus shift into the ostium of this vessel that could not be rewired. Leaving the vessel subtotally occluded.  Prox RCA to Mid RCA 3 overlapping DES stents -- 5% stenosed. Mid RCA to Dist RCA lesion is 40% stenosed.  Ost 2nd Mrg to 2nd Mrg lesion is 30% stenosed.  LV end diastolic pressure is severely elevated.  Actually patent stents throughout the RCA with moderate disease distal to the stent.  Culprit lesion is bifurcation LAD-D2 [place] Mow 1, 1, 1)-after initial balloon angioplasty became clear that this was a thrombotic lesion) --ballooning both lesions led to temporary 100% closure of the LAD followed by 100% closure of the diagonal. There was an apparent dissection associated with this lesion that was localized and covered with a stents   Disposition   Pt is being discharged home today in good condition.  Follow-up Plans & Appointments    Follow-up [place] Follow up on 10/15/2017.   Why:  Please call on 3/18 before 12 noon to request follow up appointment Contact information: [place] [id number] [phone]       Leonie Man, MD Follow up in 1 week(s).   Specialty:  Cardiology Contact information: [address] [place] [address] [phone]           Discharge Instructions    AMB Referral to Cardiac Rehabilitation - Phase II   Complete by:  As directed    Diagnosis:   STEMI Coronary Stents     Diet - low sodium heart healthy   Complete by:  As directed    Discharge instructions   Complete by:  As directed    PLEASE REMEMBER TO BRING ALL OF YOUR MEDICATIONS TO EACH OF YOUR FOLLOW-UP OFFICE VISITS.  PLEASE ATTEND ALL SCHEDULED FOLLOW-UP APPOINTMENTS.   Activity: Increase activity slowly as tolerated. You may shower, but no soaking baths (or swimming) for 1 week. No driving for 24 hours. No lifting over 5 lbs for 1 week. No sexual activity for 1 week.   Do not return to work until seen in clinic. Cardiology will help guide resumption of activities.  Wound Care: You may wash cath site gently with soap and water. Keep cath site clean and dry. If you  notice pain, swelling, bleeding or pus at your cath site, please call [phone].   Increase activity slowly   Complete by:  As directed       Discharge Medications   Allergies as of 10/14/2017   No Known Allergies     Medication List    STOP taking these medications   amLODipine 5 MG tablet Commonly known as:  NORVASC   metoprolol tartrate 25 MG tablet Commonly known as:  LOPRESSOR     TAKE these medications   aspirin EC 81 MG tablet Take 1 tablet (81 mg total) by mouth daily.   atorvastatin 20 MG tablet Commonly known as:  LIPITOR Take 1 tablet (20 mg total) by mouth daily.   Insulin Pen Needle 31G X 8 MM Misc Commonly known as:  B-D ULTRAFINE III SHORT PEN 1 application by Does not apply route daily.   liraglutide 18 MG/3ML Sopn Commonly known as:  VICTOZA 0.6 mg daily for one week, then 1.2 mg daily   lisinopril 10 MG tablet Commonly known as:  PRINIVIL,ZESTRIL Take 1 tablet (10 mg total) by mouth daily.   metFORMIN 500 MG 24 hr tablet Commonly known as:  GLUCOPHAGE-XR Take 2 tablets (1,000 mg total) by mouth daily with breakfast.   metoprolol  succinate 50 MG 24 hr tablet Commonly known as:  TOPROL-XL Take 1 tablet (50 mg total) by mouth 2 (two) times daily. Take with or immediately following a meal.   nitroGLYCERIN 0.4 MG SL tablet Commonly known as:  NITROSTAT Place 1 tablet (0.4 mg total) under the tongue every 5 (five) minutes as needed for chest pain. Reported on 11/25/2015   prasugrel 10 MG Tabs tablet Commonly known as:  EFFIENT Take 1 tablet (10 mg total) by mouth daily. Start taking on:  10/15/2017   TRUE METRIX METER w/Device Kit 1 each by Does not apply route 3 (three) times daily.   TRUEPLUS LANCETS 28G Misc 1 each by Other route 3 (three) times daily.        Aspirin prescribed at discharge?  Yes High Intensity Statin Prescribed? (Lipitor 40-57m or Crestor 20-489m: Yes Beta Blocker Prescribed? Yes For EF <40%, was ACEI/ARB Prescribed? Yes ADP Receptor Inhibitor Prescribed? (i.e. Plavix etc.-Includes Medically Managed Patients): Yes For EF <40%, Aldosterone Inhibitor Prescribed? No: marginal pressure Was EF assessed during THIS hospitalization? Yes Was Cardiac Rehab II ordered? (Included Medically managed Patients): Yes   Outstanding Labs/Studies   Echo in 3 months Lipids and LFTs in 6 weeks TCM appt with [place]orea[place] En[place]   Greater than 30 minutes including physician time.  Signed, AnTami Linuke PA-C 10/14/2017, 10:37 AM

## 2017-10-15 LAB — GLUCOSE, CAPILLARY: Glucose-Capillary: 52 mg/dL — ABNORMAL LOW (ref 65–99)

## 2017-10-15 MED FILL — METOPROLOL SUCCINATE ER 50: 50 | 30 days supply | Qty: 60 | Fill #0

## 2017-10-15 MED FILL — NITROSTAT 0.4 MG TABLET SL: 0.4 | 25 days supply | Qty: 25 | Fill #0

## 2017-10-15 MED FILL — ATORVASTATIN 20 MG TABLET: 20 | 30 days supply | Qty: 30 | Fill #0

## 2017-10-15 MED FILL — LISINOPRIL 10 MG TABS: 10 | 30 days supply | Qty: 30 | Fill #0

## 2017-10-15 MED FILL — PRASUGREL HCL 10 MG TABS: 10 | 30 days supply | Qty: 30 | Fill #0

## 2017-10-16 ENCOUNTER — Telehealth: Payer: Self-pay | Admitting: Physician Assistant

## 2017-10-16 NOTE — Telephone Encounter (Signed)
**Note De-Identified Harrietta Incorvaia Obfuscation** Patient contacted regarding discharge from [facility] on 06/16/18.  Patient understands to follow up with provider Robbie Lis, PA-c on 10/22/17 at 9 am at [facility] in [place]. Patient understands discharge instructions? yes Patient understands medications and regiment? yes Patient understands to bring all medications to this visit? yes

## 2017-10-16 NOTE — Telephone Encounter (Signed)
New message    TOC appt with Bhagat on 3/25 at 9.

## 2017-10-17 ENCOUNTER — Telehealth (HOSPITAL_COMMUNITY): Payer: Self-pay

## 2017-10-17 NOTE — Telephone Encounter (Signed)
Called and spoke with patient in regards to insurance. Patient verbally stated she does not have insurance. I offered Maintenance Self pay non monitored program to patient and patient stated she goes to see her Cardiologist on Monday. Patient would like for me to follow up Monday afternoon.

## 2017-10-22 ENCOUNTER — Encounter: Payer: Self-pay | Admitting: *Deleted

## 2017-10-22 ENCOUNTER — Ambulatory Visit (INDEPENDENT_AMBULATORY_CARE_PROVIDER_SITE_OTHER): Payer: Self-pay | Admitting: Physician Assistant

## 2017-10-22 ENCOUNTER — Encounter: Payer: Self-pay | Admitting: Physician Assistant

## 2017-10-22 VITALS — BP 152/86 | HR 79 | Ht 69.0 in | Wt 225.0 lb

## 2017-10-22 DIAGNOSIS — Z79899 Other long term (current) drug therapy: Secondary | ICD-10-CM

## 2017-10-22 DIAGNOSIS — E7800 Pure hypercholesterolemia, unspecified: Secondary | ICD-10-CM

## 2017-10-22 DIAGNOSIS — Y633 Inadvertent exposure of patient to radiation during medical care: Secondary | ICD-10-CM

## 2017-10-22 DIAGNOSIS — I340 Nonrheumatic mitral (valve) insufficiency: Secondary | ICD-10-CM

## 2017-10-22 DIAGNOSIS — Z720 Tobacco use: Secondary | ICD-10-CM

## 2017-10-22 DIAGNOSIS — I25119 Atherosclerotic heart disease of native coronary artery with unspecified angina pectoris: Secondary | ICD-10-CM

## 2017-10-22 DIAGNOSIS — I10 Essential (primary) hypertension: Secondary | ICD-10-CM

## 2017-10-22 DIAGNOSIS — I5022 Chronic systolic (congestive) heart failure: Secondary | ICD-10-CM

## 2017-10-22 MED ORDER — HYDROCHLOROTHIAZIDE 12.5 MG PO CAPS: 12.5000 mg | ORAL_CAPSULE | Freq: Every day | ORAL | 3 refills | Status: DC

## 2017-10-22 MED ORDER — ATORVASTATIN CALCIUM 40 MG PO TABS: 40.0000 mg | ORAL_TABLET | Freq: Every day | ORAL | 3 refills | Status: DC

## 2017-10-22 MED FILL — HYDROCHLOROTHIAZIDE 12.5 MG: 12.5 | 30 days supply | Qty: 30 | Fill #0

## 2017-10-22 NOTE — Patient Instructions (Addendum)
Medication Instructions:   START TAKING HCTZ  12.5 MG ONCE A DAY    START TAKING LIPITOR 40 MG ONCE A DAY   If you need a refill on your cardiac medications before your next appointment, please call your pharmacy.  Labwork:  RETURN FOR BMET IN 2 WEEKS    Testing/Procedures: NONE ORDERED  TODAY   Follow-Up:  WITH DR HARDING IN 2 MONTHS    Any Other Special Instructions Will Be Listed Below (If Applicable).

## 2017-10-22 NOTE — Progress Notes (Signed)
Cardiology Office Note    Date:  10/22/2017   ID:  Amy Chaney, DOB 05-07-1971, MRN [id number]  PCP:  No primary care provider on file.  Cardiologist:  Dr. Ellyn Hack   Chief Complaint: Hospital follow up  History of Present Illness:   Amy Chaney is a 47 y.o. female with PMH of CAD, HTN, HLD, DM and tobacco abuse presents for hospital follow up.   She is a prominentpast medical history notable for significant coronary artery disease s/pCTO PCI to the RCA July 2014.2 months following stent placement she experienced asubacute stent thrombosis due to failure to adapt.   In June and July 2017, she was seen by first Dr. Radford Pax then Richardson Dopp, PA for chest discomfort that was evaluated with a Myoview showing reduced ejection fraction. The plan was the best I understand for her to go for heart catheterization. However, this never occurred and she was lost to follow-up with cardiology since July 2017. Prior to her being seen by [facility], she was a patient of Dr. Einar Gip who performed her stents in the past.  Admitted 3/12-3/17 for inferior STEMI. She was taken to the Cath that showed patent stents throughout the RCA with moderate disease distal to the stent. Culprit lesion is bifurcation LAD-D2 Ladona Mow 1, 1, 1)-after initial balloon angioplasty became clear that this was a thrombotic lesion) --ballooning both lesions led to temporary 100% closure of the LAD followed by 100% closure of the diagonal. There was an apparent dissection associated with this lesion that was localized and covered with a stents. Discussed importance of DAPT with ASA and effient. Continue lisinopril and transition to toprol 50 mg BID. Echo showed reduced EF. Pt was euvolemic on exam without congestive heart failure. She was not discharged on a diuretic, but was instructed to weight daily. I have received message from Dr. Ellyn Hack regarding extensive fluoroscopy during cath. 6 gr of radiation.  Here  today for follow up.  She has no specific complaints.  She has intermittent orthopnea without PND.  She says her weight has been stable at home however unsure about accuracy of machine.  She is compliant with low-sodium diet.  She has gained 2 pounds since discharge.  She is able to do normal household activities without any issue however, she had a one episode of dyspnea with extreme exertion few days ago.  Resolved with rest.  No chest tightness/pressure.  She denies palpitation, dizziness, syncope, melena or blood in her stool or urine.  She is compliant with her medication  Past Medical History:  Diagnosis Date  . Coronary artery disease    a. s/p PCI of the mid RCA 01/2013 //  b. LHC 9/14: EF 55%, distal segment of RCA stent occluded, LAD irregularities >>  subacute stent thrombosis, PCI: 3 x 20 mm Promus premier DES to mid RCA //  c. Myoview 6/17: Intermediate risk, no scar or ischemia, EF 38%, hypertensive blood pressure response (231/132), findings consistent with dilated nonischemic cardiomyopathy   . Daily headache   . Depression   . Dyslipidemia, goal LDL below 70   . History of Doppler ultrasound    carotid bruit >> a. Carotid US 6/17: bilat ICA 1-39%  . History of echocardiogram    a. Echo 6/17: mild LVH, EF 55-60%, no RWMA, Gr 1 DD, mild MR  . Hypertension   . Prolonged Q-T interval on ECG 01/11/2016  . Tobacco abuse 01/11/2016  . Type II diabetes mellitus ([place]) 12/2010   sister  and son also diabetic     Past Surgical History:  Procedure Laterality Date  . CHOLECYSTECTOMY  ~ 2000  . CORONARY ANGIOPLASTY WITH STENT PLACEMENT  02/25/2013   "2" (02/25/2013)  . CORONARY STENT INTERVENTION N/A 10/10/2017   Procedure: CORONARY STENT INTERVENTION;  Surgeon: Leonie Man, MD;  Location: [facility];  Service: Cardiovascular;  Laterality: N/A;  . LEFT HEART CATH AND CORONARY ANGIOGRAPHY N/A 10/10/2017   Procedure: LEFT HEART CATH AND CORONARY ANGIOGRAPHY;  Surgeon: Leonie Man, MD;  Location: [facility];  Service: Cardiovascular;  Laterality: N/A;  . LEFT HEART CATHETERIZATION WITH CORONARY ANGIOGRAM N/A 02/25/2013   Procedure: LEFT HEART CATHETERIZATION WITH CORONARY ANGIOGRAM;  Surgeon: Laverda Page, MD;  Location: [facility];  Service: Cardiovascular;  Laterality: N/A;  . LEFT HEART CATHETERIZATION WITH CORONARY ANGIOGRAM N/A 04/01/2013   Procedure: LEFT HEART CATHETERIZATION WITH CORONARY ANGIOGRAM;  Surgeon: Laverda Page, MD;  Location: [facility] C[facility];  Service: Cardiovascular;  Laterality: N/A;  . PERCUTANEOUS CORONARY STENT INTERVENTION (PCI-S)  04/01/2013   Procedure: PERCUTANEOUS CORONARY STENT INTERVENTION (PCI-S);  Surgeon: Laverda Page, MD;  Location: [facility] CATH LAB;  Service: Cardiovascular;;  . TUBAL LIGATION  1994    Current Medications: Prior to Admission medications   Medication Sig Start Date End Date Taking? Authorizing Provider  aspirin EC 81 MG tablet Take 1 tablet (81 mg total) by mouth daily. 10/14/17  Yes Duke, Tami Lin, PA  atorvastatin (LIPITOR) 20 MG tablet Take 1 tablet (20 mg total) by mouth daily. 10/14/17  Yes Duke, Tami Lin, PA  lisinopril (PRINIVIL,ZESTRIL) 10 MG tablet Take 1 tablet (10 mg total) by mouth daily. 10/14/17  Yes Duke, Tami Lin, PA  metoprolol succinate (TOPROL-XL) 50 MG 24 hr tablet Take 1 tablet (50 mg total) by mouth 2 (two) times daily. Take with or immediately following a meal. 10/14/17  Yes Duke, Tami Lin, PA  nitroGLYCERIN (NITROSTAT) 0.4 MG SL tablet Place 1 tablet (0.4 mg total) under the tongue every 5 (five) minutes as needed for chest pain. Reported on 11/25/2015 10/14/17  Yes Duke, Tami Lin, PA  prasugrel (EFFIENT) 10 MG TABS tablet Take 1 tablet (10 mg total) by mouth daily. 10/15/17  Yes Duke, Tami Lin, PA  hydrochlorothiazide (MICROZIDE) 12.5 MG capsule Take 1 capsule (12.5 mg total) by mouth daily. 10/22/17 01/20/18  Leanor Kail, PA   Allergies:   Patient has no  known allergies.   Social History   Socioeconomic History  . Marital status: Single    Spouse name: Not on file  . Number of children: Not on file  . Years of education: Not on file  . Highest education level: Not on file  Occupational History  . Not on file  Social Needs  . Financial resource strain: Not on file  . Food insecurity:    Worry: Not on file    Inability: Not on file  . Transportation needs:    Medical: Not on file    Non-medical: Not on file  Tobacco Use  . Smoking status: Current Every Day Smoker    Packs/day: 0.50    Years: 15.00    Pack years: 7.50    Types: Cigarettes  . Smokeless tobacco: Never Used  . Tobacco comment: states she has cut back to 56 cigarettes/day   Substance and Sexual Activity  . Alcohol use: Yes    Alcohol/week: 0.0 oz    Comment: 02/25/2013 "only on special occasions; maybe  once/yr"  . Drug use: Yes    Types: Marijuana    Comment: 02/25/2013 "quit marijuana ~ 2001"  . Sexual activity: Not Currently    Birth control/protection: Surgical  Lifestyle  . Physical activity:    Days per week: Not on file    Minutes per session: Not on file  . Stress: Not on file  Relationships  . Social connections:    Talks on phone: Not on file    Gets together: Not on file    Attends religious service: Not on file    Active member of club or organization: Not on file    Attends meetings of clubs or organizations: Not on file    Relationship status: Not on file  Other Topics Concern  . Not on file  Social History Narrative  . Not on file     Family History:  The patient's family history includes Diabetes in her mother, sister, sister, son, and son.   ROS:   Please see the history of present illness.    ROS All other systems reviewed and are negative.   PHYSICAL EXAM:   VS:  BP (!) 152/86   Pulse 79   Ht 5\' 9"  (1.753 m)   Wt 225 lb (102.1 kg)   LMP 10/20/2015   SpO2 99%   BMI 33.23 kg/m    GEN: Well nourished, well developed,  African-American female in no acute distress  HEENT: normal  Neck: no JVD, carotid bruits, or masses Cardiac: RRR;  Systolic murmurs, rubs, or gallops,no edema  Respiratory:  clear to auscultation bilaterally, normal work of breathing GI: soft, nontender, nondistended, + BS MS: no deformity or atrophy  Skin: warm and dry, had through skin exam without evidence of rash,  Mild ecchymosis at IV site Neuro:  Alert and Oriented x 3, Strength and sensation are intact Psych: euthymic mood, full affect  Wt Readings from Last 3 Encounters:  10/22/17 225 lb (102.1 kg)  10/14/17 223 lb 4.8 oz (101.3 kg)  01/31/16 239 lb 12.8 oz (108.8 kg)      Studies/Labs Reviewed:   EKG:  EKG is not ordered today.    Recent Labs: 10/10/2017: ALT 15; TSH 0.672 10/12/2017: Magnesium 1.8 10/14/2017: BUN 18; Creatinine, Ser 0.72; Hemoglobin 11.6; Platelets 369; Potassium 3.7; Sodium 137   Lipid Panel    Component Value Date/Time   CHOL 165 10/10/2017 0416   TRIG 35 10/10/2017 0416   HDL 52 10/10/2017 0416   CHOLHDL 3.2 10/10/2017 0416   VLDL 7 10/10/2017 0416   LDLCALC 106 (H) 10/10/2017 0416    Additional studies/ records that were reviewed today include:   ECHO 10/11/2017 - Left ventricle: The cavity size was normal. Wall thickness was increased in a pattern of mild LVH. The estimated ejection fraction was 35%. The anteroseptal, anterior, and anterolateral walls were hypokinetic. Features are consistent with a pseudonormal left ventricular filling pattern, with concomitant abnormal relaxation and increased filling pressure (grade 2 diastolic dysfunction). - Aortic valve: There was no stenosis. - Mitral valve: There was moderate regurgitation. - Left atrium: The atrium was moderately dilated. - Right ventricle: The cavity size was normal. Systolic function was normal. - Pulmonary arteries: PA peak pressure: 36 mm Hg (S). - Inferior vena cava: The vessel was normal in size.  The respirophasic diameter changes were in the normal range (>= 50%), consistent with normal central venous pressure.  Impressions: - Normal LV size with mild LV hypertrophy. EF 35% with wall motion abnormalities as  noted above. Moderate diastolic dysfunction. Normal RV size and systolic function. Moderate mitral regurgitation.  Left Heart Cath 10/10/2017-->  Culprit lesion is bifurcation LAD-D2 ([place] 1, 1, 1)  Prox LAD to Mid LAD lesion is 90% stenosed.  3 overlapping Drug-Eluting Stents were successfully placed using a STENT SYNERGY DES 3.5X32 (p),STENT SYNERGY DES 3.5X 8 (m), STENT SYNERGY DES 3.5X28 (d)  Post intervention, there is a 0% residual stenosis.  Ost 2nd Diag lesion is 90% stenosed. Post intervention, there is a 95% residual stenosis.  A drug-eluting stent was successfully placed using a STENT SYNERGY DES 2.5X24. However after crush technique was performed, there was plaque/thrombus shift into the ostium of this vessel that could not be rewired. Leaving the vessel subtotally occluded.  Prox RCA to Mid RCA 3 overlapping DES stents -- 5% stenosed. Mid RCA to Dist RCA lesion is 40% stenosed.  Ost 2nd Mrg to 2nd Mrg lesion is 30% stenosed.  LV end diastolic pressure is severely elevated.  Actually patent stents throughout the RCA with moderate disease distal to the stent.  Culprit lesion is bifurcation LAD-D2 Ladona Mow 1, 1, 1)-after initial balloon angioplasty became clear that this was a thrombotic lesion) --ballooning both lesions led to temporary 100% closure of the LAD followed by 100% closure of the diagonal. There was an apparent dissection associated with this lesion that was localized and covered with a stents  ASSESSMENT & PLAN:    1. CAD  - Cath as above.  The patient is able to perform normal activity without any issue however had dyspnea few days ago with extreme exertion.  This was resolved with rest. Did not required nitro. No recurrent  episode.  Patient works as a Secretary/administrator.  She does not have any insurance and wants to go back to work as soon as possible.  Return to work note given with limitation.  She will give [place] a call if recurrent angina or dyspnea.  Continue aspirin, Effient, beta-blocker and statin.  2.  Prolonged fluoroscopy - 6 gr of radiation.  Patient has full body exam.  No rash found on black tinted skin.  Educated regarding potential complication.  Patient understands and accepted risk.  Your procedure required the use of prolonged amounts of x-ray. Radiation side-effects are unlikely but possible. Please have a family member inspect your chest and back area daily, for signs of redness or rash two weeks from today. Please call your provider and tell us whether if you have concerns about your findings.   3. HLD - 10/10/2017: Cholesterol 165; HDL 52; LDL Cholesterol 106; Triglycerides 35; VLDL 7  -Increase Lipitor to 40 mg daily.  Consider repeat labs during follow-up.  4.  Chronic systolic heart failure -EF of 35%.  Patient has intermittent of orthopnea without PND.  She is euvolemic on exam today.  She has gained 2 pounds since discharge.  Advised to watch salt intake.  Given minimal elevated blood pressure and weight gain with orthopnea, start low-dose hydrochlorothiazide.  Repeat bmet in few weeks.  Continue Toprol-XL and lisinopril at current dose.  Heart failure education given in detail. Consider repeat echo in few months.  5.  Hypertension -Minimally elevated.  Start HCTZ as above. Up titrate as needed during PCP visit.   6.  Diabetes -Her hemoglobin A1c was 8.6 during admission.  She is currently not on any medication.  She has follow-up with PCP this Wednesday.  7.  Tobacco abuse -She has cut back on smoking.  Currently smokes 1 cigarette  a day.  Encourage complete cessation.  8. Moderate MR - as above.    Patient has appointment in community clinic this week.  She will need assistance with  medication as well as follow-up.  Consider social worker consultation for assistance.  She wants to enroll in cardiac rehab program however unable to do so due to lack of insurance and job requirements.  I have stressed importance for medication compliance and regular follow-up visit.  If she has any recurrent angina or dyspnea, encouraged to call and may needed to postpone going back to work.  Medication Adjustments/Labs and Tests Ordered: Current medicines are reviewed at length with the patient today.  Concerns regarding medicines are outlined above.  Medication changes, Labs and Tests ordered today are listed in the Patient Instructions below. Patient Instructions  Medication Instructions:   START TAKING HCTZ  12.5 MG ONCE A DAY   If you need a refill on your cardiac medications before your next appointment, please call your pharmacy.  Labwork:  RETURN FOR BMET IN 2 WEEKS    Testing/Procedures: NONE ORDERED  TODAY   Follow-Up:  WITH DR HARDING IN 2 MONTHS    Any Other Special Instructions Will Be Listed Below (If Applicable).                                                                                                                                                      Jarrett Soho, [place]  10/22/2017 9:38 AM    [facility] [address], [place], [place]  [address] Phone: [phone]; Fax: [phone]

## 2017-10-24 ENCOUNTER — Other Ambulatory Visit: Payer: Self-pay

## 2017-10-24 ENCOUNTER — Ambulatory Visit: Payer: Self-pay | Attending: Family Medicine | Admitting: Physician Assistant

## 2017-10-24 ENCOUNTER — Telehealth (HOSPITAL_COMMUNITY): Payer: Self-pay

## 2017-10-24 VITALS — BP 142/83 | HR 76 | Temp 98.6°F | Resp 18 | Ht 69.0 in | Wt 232.6 lb

## 2017-10-24 DIAGNOSIS — E785 Hyperlipidemia, unspecified: Secondary | ICD-10-CM

## 2017-10-24 DIAGNOSIS — I5020 Unspecified systolic (congestive) heart failure: Secondary | ICD-10-CM

## 2017-10-24 DIAGNOSIS — I5022 Chronic systolic (congestive) heart failure: Secondary | ICD-10-CM | POA: Insufficient documentation

## 2017-10-24 DIAGNOSIS — I255 Ischemic cardiomyopathy: Secondary | ICD-10-CM

## 2017-10-24 DIAGNOSIS — I252 Old myocardial infarction: Secondary | ICD-10-CM | POA: Insufficient documentation

## 2017-10-24 DIAGNOSIS — F17210 Nicotine dependence, cigarettes, uncomplicated: Secondary | ICD-10-CM | POA: Insufficient documentation

## 2017-10-24 DIAGNOSIS — Z7982 Long term (current) use of aspirin: Secondary | ICD-10-CM | POA: Insufficient documentation

## 2017-10-24 DIAGNOSIS — I110 Hypertensive heart disease with heart failure: Secondary | ICD-10-CM | POA: Insufficient documentation

## 2017-10-24 DIAGNOSIS — E119 Type 2 diabetes mellitus without complications: Secondary | ICD-10-CM

## 2017-10-24 DIAGNOSIS — I2510 Atherosclerotic heart disease of native coronary artery without angina pectoris: Secondary | ICD-10-CM

## 2017-10-24 DIAGNOSIS — Z79899 Other long term (current) drug therapy: Secondary | ICD-10-CM | POA: Insufficient documentation

## 2017-10-24 DIAGNOSIS — Z09 Encounter for follow-up examination after completed treatment for conditions other than malignant neoplasm: Secondary | ICD-10-CM | POA: Insufficient documentation

## 2017-10-24 DIAGNOSIS — Z7984 Long term (current) use of oral hypoglycemic drugs: Secondary | ICD-10-CM | POA: Insufficient documentation

## 2017-10-24 DIAGNOSIS — E1169 Type 2 diabetes mellitus with other specified complication: Secondary | ICD-10-CM

## 2017-10-24 LAB — GLUCOSE, POCT (MANUAL RESULT ENTRY): POC GLUCOSE: 242 mg/dL — AB (ref 70–99)

## 2017-10-24 MED ORDER — METFORMIN HCL 1000 MG PO TABS
1000.0000 mg | ORAL_TABLET | Freq: Two times a day (BID) | ORAL | 2 refills | Status: DC
Start: 2017-10-24 — End: 2017-11-30

## 2017-10-24 MED ORDER — GLUCOSE BLOOD VI STRP
ORAL_STRIP | 12 refills | Status: DC
Start: 2017-10-24 — End: 2018-12-31

## 2017-10-24 MED ORDER — TRUE METRIX METER DEVI
1.0000 | Freq: Four times a day (QID) | 0 refills | Status: DC
Start: 2017-10-24 — End: 2022-11-07

## 2017-10-24 MED ORDER — TRUEPLUS LANCETS 28G MISC: 28.0000 g | Freq: Four times a day (QID) | 2 refills | Status: DC

## 2017-10-24 MED ORDER — GLIPIZIDE 10 MG PO TABS: 10.0000 mg | ORAL_TABLET | Freq: Two times a day (BID) | ORAL | 2 refills | Status: DC

## 2017-10-24 MED FILL — !TRUE METRIX BLOOD GLUCOSE: 365 days supply | Qty: 1 | Fill #0

## 2017-10-24 MED FILL — TRUEplus LANCETS 28G MISC: 25 days supply | Qty: 100 | Fill #0

## 2017-10-24 MED FILL — metFORMIN HCL 1000 MG TABS: 1000 | 30 days supply | Qty: 60 | Fill #0

## 2017-10-24 MED FILL — TRUE METRIX TEST STRIP: 25 days supply | Qty: 100 | Fill #0

## 2017-10-24 MED FILL — glipiZIDE 10 MG TABS: 10 | 30 days supply | Qty: 60 | Fill #0

## 2017-10-24 NOTE — Patient Instructions (Signed)
Aim for 30 minutes of exercise most days. Rethink what you drink. Water is great! Aim for 2-3 Carb Choices per meal (30-45 grams) +/- 1 either way  Aim for 0-15 Carbs per snack if hungry  Include protein in moderation with your meals and snacks  Consider reading food labels for Total Carbohydrate and Fat Grams of foods  Consider checking BG at alternate times per day  Continue taking medication as directed Be mindful about how much sugar you are adding to beverages and other foods. Fruit Punch - find one with no sugar  Measure and decrease portions of carbohydrate foods  Make your plate and don't go back for seconds   Coping with Quitting Smoking Quitting smoking is a physical and mental challenge. You will face cravings, withdrawal symptoms, and temptation. Before quitting, work with your health care provider to make a plan that can help you cope. Preparation can help you quit and keep you from giving in. How can I cope with cravings? Cravings usually last for 5-10 minutes. If you get through it, the craving will pass. Consider taking the following actions to help you cope with cravings:  Keep your mouth busy: ? Chew sugar-free gum. ? Suck on hard candies or a straw. ? Brush your teeth.  Keep your hands and body busy: ? Immediately change to a different activity when you feel a craving. ? Squeeze or play with a ball. ? Do an activity or a hobby, like making bead jewelry, practicing needlepoint, or working with wood. ? Mix up your normal routine. ? Take a short exercise break. Go for a quick walk or run up and down stairs. ? Spend time in public places where smoking is not allowed.  Focus on doing something kind or helpful for someone else.  Call a friend or family member to talk during a craving.  Join a support group.  Call a quit line, such as 1-800-QUIT-NOW.  Talk with your health care provider about medicines that might help you cope with cravings and make  quitting easier for you.  How can I deal with withdrawal symptoms? Your body may experience negative effects as it tries to get used to not having nicotine in the system. These effects are called withdrawal symptoms. They may include:  Feeling hungrier than normal.  Trouble concentrating.  Irritability.  Trouble sleeping.  Feeling depressed.  Restlessness and agitation.  Craving a cigarette.  To manage withdrawal symptoms:  Avoid places, people, and activities that trigger your cravings.  Remember why you want to quit.  Get plenty of sleep.  Avoid coffee and other caffeinated drinks. These may worsen some of your symptoms.  How can I handle social situations? Social situations can be difficult when you are quitting smoking, especially in the first few weeks. To manage this, you can:  Avoid parties, bars, and other social situations where people might be smoking.  Avoid alcohol.  Leave right away if you have the urge to smoke.  Explain to your family and friends that you are quitting smoking. Ask for understanding and support.  Plan activities with friends or family where smoking is not an option.  What are some ways I can cope with stress? Wanting to smoke may cause stress, and stress can make you want to smoke. Find ways to manage your stress. Relaxation techniques can help. For example:  Breathe slowly and deeply, in through your nose and out through your mouth.  Listen to soothing, relaxing music.  Talk with a  family member or friend about your stress.  Light a candle.  Soak in a bath or take a shower.  Think about a peaceful place.  What are some ways I can prevent weight gain? Be aware that many people gain weight after they quit smoking. However, not everyone does. To keep from gaining weight, have a plan in place before you quit and stick to the plan after you quit. Your plan should include:  Having healthy snacks. When you have a craving, it may  help to: ? Eat plain popcorn, crunchy carrots, celery, or other cut vegetables. ? Chew sugar-free gum.  Changing how you eat: ? Eat small portion sizes at meals. ? Eat 4-6 small meals throughout the day instead of 1-2 large meals a day. ? Be mindful when you eat. Do not watch television or do other things that might distract you as you eat.  Exercising regularly: ? Make time to exercise each day. If you do not have time for a long workout, do short bouts of exercise for 5-10 minutes several times a day. ? Do some form of strengthening exercise, like weight lifting, and some form of aerobic exercise, like running or swimming.  Drinking plenty of water or other low-calorie or no-calorie drinks. Drink 6-8 glasses of water daily, or as much as instructed by your health care provider.  Summary  Quitting smoking is a physical and mental challenge. You will face cravings, withdrawal symptoms, and temptation to smoke again. Preparation can help you as you go through these challenges.  You can cope with cravings by keeping your mouth busy (such as by chewing gum), keeping your body and hands busy, and making calls to family, friends, or a helpline for people who want to quit smoking.  You can cope with withdrawal symptoms by avoiding places where people smoke, avoiding drinks with caffeine, and getting plenty of rest.  Ask your health care provider about the different ways to prevent weight gain, avoid stress, and handle social situations. This information is not intended to replace advice given to you by your health care provider. Make sure you discuss any questions you have with your health care provider. Document Released: 07/14/2016 Document Revised: 07/14/2016 Document Reviewed: 07/14/2016 Elsevier Interactive Patient Education  [employer].

## 2017-10-24 NOTE — Progress Notes (Signed)
Chief Complaint: Hospital follow up  Subjective: This is a 47 year old female with known coronary artery disease status post PCI of the right coronary artery in 2536, systolic heart failure, diabetes mellitus type 2, smoking, dyslipidemia and prolonged QT interval wh to follow-up here. She was last seen by Dr. Illene Regulus in April 2017. She presented to the hospital on 10/09/2017 with the acute onset of chest pain. Her initial EKG was significant for an ST elevation myocardial infarction. She was taken emergently to the cardiac catheterization lab and underwent PCI of the LAD/diagonal with drug-eluting stents. This was potentially complicated by dissection and increased fluoroscopy exposure. Her echocardiogram showed an EF of 35%. Here was moderate mitral regurgitation and moderate diastolic dysfunction. There was wall motion and mouth use. Her A1c was 8.6%. He is off all of her diabetes medications. She is no longer checking her sugar.  Since her hospitalization she has had cardiac follow-up. They recommended starting hydrochlorothiazide which she has not yet had filled yet. She continues to smoke a few cigarettes per day. She has not gone back to work. Compliant with the other medications. Does not have refills of her diabetes agents. No longer has a meter. Trying to watch her sodium intake. No chest pain. No shortness of breath.   ROS:  GEN: denies fever or chills, denies change in weight Skin: denies lesions or rashes HEENT: denies headache, earache, epistaxis, sore throat, or neck pain LUNGS: denies SHOB, dyspnea, PND, orthopnea CV: denies CP or palpitations ABD: denies abd pain, N or V EXT: denies muscle spasms or swelling; no pain in lower ext, no weakness NEURO: denies numbness or tingling, denies sz, stroke or TIA   Objective:  Vitals:   10/24/17 0943  BP: (!) 142/83  Pulse: 76  Resp: 18  Temp: 98.6 F (37 C)  TempSrc: Oral  SpO2: 100%  Weight: 232 lb 9.6 oz (105.5 kg)  Height:  _0  (1.753 m)    Physical Exam:  General: in no acute distress. HEENT: no pallor, no icterus, moist oral mucosa, no JVD, no lymphadenopathy Heart: Normal  s1 &s2  Regular rate and rhythm, with faint murmur, rubs, gallops. Lungs: Clear to auscultation bilaterally. Abdomen: Soft, nontender, nondistended, positive bowel sounds. Extremities: No clubbing cyanosis, trace edema with positive pedal pulses. Neuro: Alert, awake, oriented x3, nonfocal.   Medications: Prior to Admission medications   Medication Sig Start Date End Date Taking? Authorizing Provider  aspirin EC 81 MG tablet Take 1 tablet (81 mg total) by mouth daily. 10/14/17  Yes Duke, Tami Lin, PA  atorvastatin (LIPITOR) 40 MG tablet Take 1 tablet (40 mg total) by mouth daily. 10/22/17  Yes Bhagat, Bhavinkumar, PA  hydrochlorothiazide (MICROZIDE) 12.5 MG capsule Take 1 capsule (12.5 mg total) by mouth daily. 10/22/17 01/20/18 Yes Bhagat, Bhavinkumar, PA  lisinopril (PRINIVIL,ZESTRIL) 10 MG tablet Take 1 tablet (10 mg total) by mouth daily. 10/14/17  Yes Duke, Tami Lin, PA  metoprolol succinate (TOPROL-XL) 50 MG 24 hr tablet Take 1 tablet (50 mg total) by mouth 2 (two) times daily. Take with or immediately following a meal. 10/14/17  Yes Duke, Tami Lin, PA  nitroGLYCERIN (NITROSTAT) 0.4 MG SL tablet Place 1 tablet (0.4 mg total) under the tongue every 5 (five) minutes as needed for chest pain. Reported on 11/25/2015 10/14/17  Yes Duke, Tami Lin, PA  prasugrel (EFFIENT) 10 MG TABS tablet Take 1 tablet (10 mg total) by mouth daily. 10/15/17  Yes Duke, Tami Lin, PA  Blood Glucose Monitoring Suppl (  TRUE METRIX METER) DEVI 1 kit by Does not apply route 4 (four) times daily. 10/24/17   Brayton Caves, PA-C  glipiZIDE (GLUCOTROL) 10 MG tablet Take 1 tablet (10 mg total) by mouth 2 (two) times daily before a meal. 10/24/17   Zettie Pho S, PA-C  glucose blood (TRUE METRIX BLOOD GLUCOSE TEST) test strip Use as instructed  10/24/17   Brayton Caves, PA-C  metFORMIN (GLUCOPHAGE) 1000 MG tablet Take 1 tablet (1,000 mg total) by mouth 2 (two) times daily with a meal. 10/24/17   Ena Dawley, Antonique Langford S, PA-C  TRUEPLUS LANCETS 28G MISC 28 g by Does not apply route 4 (four) times daily. 10/24/17   Brayton Caves, PA-C    Assessment: 1. CAD  -hx PCI RCA 2014  -recent STEMI with PCI LAD/D2 3/19 2. DM 2-off meds 3. HTN 4. ICM EF 82%/UMPNTIR systolic heart failure/HFrEF 5. Dyslipidemia 6. Smoker  Plan: Cont GDMT/DAPT CV f/u 12/27/17 as scheduled HCTZ added for fluid and BP RF modification Restart last known DM regimen New meter Education on salt and carbs, 27 min of face to face time Smoking cessation discussed Work note/restricitons  Follow up:2-3 weeks and will need BMP  The patient was given clear instructions to go to ER or return to medical center if symptoms don't improve, worsen or new problems develop. The patient verbalized understanding. The patient was told to call to get lab results if they haven't heard anything in the next week.   This note has been created with Surveyor, quantity. Any transcriptional errors are unintentional.   Zettie Pho, PA-C 10/24/2017, 10:25 AM

## 2017-10-24 NOTE — Telephone Encounter (Signed)
Attempted to call patient to follow up in regards to Maintenance program - lm on vm

## 2017-10-24 NOTE — Progress Notes (Signed)
JA 

## 2017-10-31 ENCOUNTER — Telehealth (HOSPITAL_COMMUNITY): Payer: Self-pay

## 2017-10-31 NOTE — Telephone Encounter (Signed)
Called and spoke with patient in regards to Cardiac Rehab Maintenance program - Patient stated she cannot afford program. Closed referral.

## 2017-11-05 ENCOUNTER — Other Ambulatory Visit: Payer: Self-pay | Admitting: *Deleted

## 2017-11-05 DIAGNOSIS — Z79899 Other long term (current) drug therapy: Secondary | ICD-10-CM

## 2017-11-05 LAB — BASIC METABOLIC PANEL
BUN/Creatinine Ratio: 13 (ref 9–23)
BUN: 10 mg/dL (ref 6–24)
CALCIUM: 9.3 mg/dL (ref 8.7–10.2)
CHLORIDE: 99 mmol/L (ref 96–106)
CO2: 25 mmol/L (ref 20–29)
Creatinine, Ser: 0.76 mg/dL (ref 0.57–1.00)
GFR calc Af Amer: 108 mL/min/{1.73_m2} (ref >59)
GFR calc non Af Amer: 94 mL/min/{1.73_m2} (ref >59)
Glucose: 309 mg/dL — ABNORMAL HIGH (ref 65–99)
Potassium: 4 mmol/L (ref 3.5–5.2)
Sodium: 139 mmol/L (ref 134–144)

## 2017-11-30 ENCOUNTER — Ambulatory Visit: Payer: Self-pay | Attending: Family Medicine | Admitting: Family Medicine

## 2017-11-30 ENCOUNTER — Encounter: Payer: Self-pay | Admitting: Family Medicine

## 2017-11-30 VITALS — BP 170/93 | HR 71 | Temp 97.9°F | Ht 69.0 in | Wt 226.2 lb

## 2017-11-30 DIAGNOSIS — Z9889 Other specified postprocedural states: Secondary | ICD-10-CM | POA: Insufficient documentation

## 2017-11-30 DIAGNOSIS — F329 Major depressive disorder, single episode, unspecified: Secondary | ICD-10-CM | POA: Insufficient documentation

## 2017-11-30 DIAGNOSIS — I25110 Atherosclerotic heart disease of native coronary artery with unstable angina pectoris: Secondary | ICD-10-CM | POA: Insufficient documentation

## 2017-11-30 DIAGNOSIS — Z955 Presence of coronary angioplasty implant and graft: Secondary | ICD-10-CM | POA: Insufficient documentation

## 2017-11-30 DIAGNOSIS — Z7982 Long term (current) use of aspirin: Secondary | ICD-10-CM | POA: Insufficient documentation

## 2017-11-30 DIAGNOSIS — Z9049 Acquired absence of other specified parts of digestive tract: Secondary | ICD-10-CM | POA: Insufficient documentation

## 2017-11-30 DIAGNOSIS — R0602 Shortness of breath: Secondary | ICD-10-CM | POA: Insufficient documentation

## 2017-11-30 DIAGNOSIS — I5042 Chronic combined systolic (congestive) and diastolic (congestive) heart failure: Secondary | ICD-10-CM | POA: Insufficient documentation

## 2017-11-30 DIAGNOSIS — Z9114 Patient's other noncompliance with medication regimen: Secondary | ICD-10-CM | POA: Insufficient documentation

## 2017-11-30 DIAGNOSIS — I10 Essential (primary) hypertension: Secondary | ICD-10-CM

## 2017-11-30 DIAGNOSIS — E1165 Type 2 diabetes mellitus with hyperglycemia: Secondary | ICD-10-CM | POA: Insufficient documentation

## 2017-11-30 DIAGNOSIS — Z9119 Patient's noncompliance with other medical treatment and regimen: Secondary | ICD-10-CM | POA: Insufficient documentation

## 2017-11-30 DIAGNOSIS — F32A Depression, unspecified: Secondary | ICD-10-CM

## 2017-11-30 DIAGNOSIS — E785 Hyperlipidemia, unspecified: Secondary | ICD-10-CM | POA: Insufficient documentation

## 2017-11-30 DIAGNOSIS — I110 Hypertensive heart disease with heart failure: Secondary | ICD-10-CM | POA: Insufficient documentation

## 2017-11-30 DIAGNOSIS — Z79899 Other long term (current) drug therapy: Secondary | ICD-10-CM | POA: Insufficient documentation

## 2017-11-30 DIAGNOSIS — Z7984 Long term (current) use of oral hypoglycemic drugs: Secondary | ICD-10-CM | POA: Insufficient documentation

## 2017-11-30 LAB — GLUCOSE, POCT (MANUAL RESULT ENTRY): POC GLUCOSE: 323 mg/dL — AB (ref 70–99)

## 2017-11-30 MED ORDER — GLIPIZIDE 10 MG PO TABS: 10.0000 mg | ORAL_TABLET | Freq: Two times a day (BID) | ORAL | 2 refills | Status: DC

## 2017-11-30 MED ORDER — DAPAGLIFLOZIN PROPANEDIOL 10 MG PO TABS: 10.0000 mg | ORAL_TABLET | Freq: Every day | ORAL | 3 refills | Status: DC

## 2017-11-30 NOTE — Patient Instructions (Signed)

## 2017-11-30 NOTE — Progress Notes (Signed)
Subjective:  Patient ID: Amy Chaney, female    DOB: [birth year]  Age: 47 y.o. MRN: [id number]  CC: Diabetes and Establish Care   HPI KIARI HOSMER is a 47 year old female with a history of type 2 diabetes mellitus (A1c 8.6), hypertension, CHF (EF 35% from 09/2017), CAD (status post PCI to LAD with DES in 09/2017) who presents today to establish care with me. Her blood pressure is elevated and she endorses not taking her hypertensives yet. Last visit to cardiology was on 10/22/2017 She endorses intermittent shortness of breath which is associated with left inframammary pain radiating to her left mid back which occurs about 2 times a week for the last 2 weeks with no preceding exertion.  Denies pedal edema or recent weight gain.  She denies pain at this time but also notes that symptoms occur when she is stressed. She is currently living with her sister who has Alzheimer's dementia. She admits to being depressed due to her medical conditions and other family stresses at home.  Her fasting sugars have been in the 200 range and she has not been compliant with metformin because it makes her sick but she has been taking glipizide.  Past Medical History:  Diagnosis Date  . Coronary artery disease    a. s/p PCI of the mid RCA 01/2013 //  b. LHC 9/14: EF 55%, distal segment of RCA stent occluded, LAD irregularities >>  subacute stent thrombosis, PCI: 3 x 20 mm Promus premier DES to mid RCA //  c. Myoview 6/17: Intermediate risk, no scar or ischemia, EF 38%, hypertensive blood pressure response (231/132), findings consistent with dilated nonischemic cardiomyopathy   . Daily headache   . Depression   . Dyslipidemia, goal LDL below 70   . History of Doppler ultrasound    carotid bruit >> a. Carotid US 6/17: bilat ICA 1-39%  . History of echocardiogram    a. Echo 6/17: mild LVH, EF 55-60%, no RWMA, Gr 1 DD, mild MR  . Hypertension   . Prolonged Q-T interval on ECG 01/11/2016  . Tobacco abuse  01/11/2016  . Type II diabetes mellitus ([place]) 12/2010   sister and son also diabetic     Past Surgical History:  Procedure Laterality Date  . CHOLECYSTECTOMY  ~ 2000  . CORONARY ANGIOPLASTY WITH STENT PLACEMENT  02/25/2013   "2" (02/25/2013)  . CORONARY STENT INTERVENTION N/A 10/10/2017   Procedure: CORONARY STENT INTERVENTION;  Surgeon: Leonie Man, MD;  Location: [place] [facility];  Service: Cardiovascular;  Laterality: N/A;  . LEFT HEART CATH AND CORONARY ANGIOGRAPHY N/A 10/10/2017   Procedure: LEFT HEART CATH AND CORONARY ANGIOGRAPHY;  Surgeon: Leonie Man, MD;  Location: [place];  Service: Cardiovascular;  Laterality: N/A;  . LEFT HEART CATHETERIZATION WITH CORONARY ANGIOGRAM N/A 02/25/2013   Procedure: LEFT HEART CATHETERIZATION WITH CORONARY ANGIOGRAM;  Surgeon: Laverda Page, MD;  Location: [facility] CATH LAB;  Service: Cardiovascular;  Laterality: N/A;  . LEFT HEART CATHETERIZATION WITH CORONARY ANGIOGRAM N/A 04/01/2013   Procedure: LEFT HEART CATHETERIZATION WITH CORONARY ANGIOGRAM;  Surgeon: Laverda Page, MD;  Location: [facility] CATH LAB;  Service: Cardiovascular;  Laterality: N/A;  . PERCUTANEOUS CORONARY STENT INTERVENTION (PCI-S)  04/01/2013   Procedure: PERCUTANEOUS CORONARY STENT INTERVENTION (PCI-S);  Surgeon: Laverda Page, MD;  Location: [facility] CATH LAB;  Service: Cardiovascular;;  . TUBAL LIGATION  1994    No Known Allergies   Outpatient Medications Prior to Visit  Medication Sig Dispense  Refill  . aspirin EC 81 MG tablet Take 1 tablet (81 mg total) by mouth daily. 90 tablet 3  . atorvastatin (LIPITOR) 40 MG tablet Take 1 tablet (40 mg total) by mouth daily. 30 tablet 3  . Blood Glucose Monitoring Suppl (TRUE METRIX METER) DEVI 1 kit by Does not apply route 4 (four) times daily. 1 Device 0  . glucose blood (TRUE METRIX BLOOD GLUCOSE TEST) test strip Use as instructed 100 each 12  . hydrochlorothiazide (MICROZIDE) 12.5 MG capsule Take 1 capsule (12.5 mg  total) by mouth daily. 30 capsule 3  . lisinopril (PRINIVIL,ZESTRIL) 10 MG tablet Take 1 tablet (10 mg total) by mouth daily. 90 tablet 3  . metoprolol succinate (TOPROL-XL) 50 MG 24 hr tablet Take 1 tablet (50 mg total) by mouth 2 (two) times daily. Take with or immediately following a meal. 180 tablet 3  . nitroGLYCERIN (NITROSTAT) 0.4 MG SL tablet Place 1 tablet (0.4 mg total) under the tongue every 5 (five) minutes as needed for chest pain. Reported on 11/25/2015 25 tablet 3  . prasugrel (EFFIENT) 10 MG TABS tablet Take 1 tablet (10 mg total) by mouth daily. 90 tablet 3  . TRUEPLUS LANCETS 28G MISC 28 g by Does not apply route 4 (four) times daily. 120 each 2  . glipiZIDE (GLUCOTROL) 10 MG tablet Take 1 tablet (10 mg total) by mouth 2 (two) times daily before a meal. 60 tablet 2  . metFORMIN (GLUCOPHAGE) 1000 MG tablet Take 1 tablet (1,000 mg total) by mouth 2 (two) times daily with a meal. (Patient not taking: Reported on 11/30/2017) 60 tablet 2   No facility-administered medications prior to visit.     ROS Review of Systems  Constitutional: Negative for activity change, appetite change and fatigue.  HENT: Negative for congestion, sinus pressure and sore throat.   Eyes: Negative for visual disturbance.  Respiratory: Positive for shortness of breath. Negative for cough, chest tightness and wheezing.   Cardiovascular: Negative for chest pain and palpitations.  Gastrointestinal: Negative for abdominal distention, abdominal pain and constipation.  Endocrine: Negative for polydipsia.  Genitourinary: Negative for dysuria and frequency.  Musculoskeletal: Negative for arthralgias and back pain.  Skin: Negative for rash.  Neurological: Negative for tremors, light-headedness and numbness.  Hematological: Does not bruise/bleed easily.  Psychiatric/Behavioral: Negative for agitation and behavioral problems.    Objective:  BP (!) 170/93   Pulse 71   Temp 97.9 F (36.6 C) (Oral)   Ht 5' 9"   (1.753 m)   Wt 226 lb 3.2 oz (102.6 kg)   LMP 10/20/2015   SpO2 100%   BMI 33.40 kg/m   BP/Weight 11/30/2017 10/24/2017 1/70/0174  Systolic BP 944 967 591  Diastolic BP 93 83 86  Wt. (Lbs) 226.2 232.6 225  BMI 33.4 34.35 33.23    Wt Readings from Last 3 Encounters:  11/30/17 226 lb 3.2 oz (102.6 kg)  10/24/17 232 lb 9.6 oz (105.5 kg)  10/22/17 225 lb (102.1 kg)     Physical Exam  Constitutional: She is oriented to person, place, and time. She appears well-developed and well-nourished.  Cardiovascular: Normal rate, normal heart sounds and intact distal pulses.  No murmur heard. Pulmonary/Chest: Effort normal and breath sounds normal. She has no wheezes. She has no rales. She exhibits no tenderness.  Abdominal: Soft. Bowel sounds are normal. She exhibits no distension and no mass. There is no tenderness.  Musculoskeletal: Normal range of motion.  Neurological: She is alert and oriented to person,  place, and time.  Skin: Skin is warm and dry.  Psychiatric: She has a normal mood and affect.    CMP Latest Ref Rng & Units 11/05/2017 10/14/2017 10/13/2017  Glucose 65 - 99 mg/dL 309(H) 126(H) 166(H)  BUN 6 - 24 mg/dL 10 18 15   Creatinine 0.57 - 1.00 mg/dL 0.76 0.72 0.77  Sodium 134 - 144 mmol/L 139 137 137  Potassium 3.5 - 5.2 mmol/L 4.0 3.7 4.1  Chloride 96 - 106 mmol/L 99 106 107  CO2 20 - 29 mmol/L 25 23 22   Calcium 8.7 - 10.2 mg/dL 9.3 8.8(L) 8.9  Total Protein 6.5 - 8.1 g/dL - - -  Total Bilirubin 0.3 - 1.2 mg/dL - - -  Alkaline Phos 38 - 126 U/L - - -  AST 15 - 41 U/L - - -  ALT 14 - 54 U/L - - -    Lab Results  Component Value Date   HGBA1C 8.6 (H) 10/10/2017    Lipid Panel     Component Value Date/Time   CHOL 165 10/10/2017 0416   TRIG 35 10/10/2017 0416   HDL 52 10/10/2017 0416   CHOLHDL 3.2 10/10/2017 0416   VLDL 7 10/10/2017 0416   LDLCALC 106 (H) 10/10/2017 0416    Assessment & Plan:   1. Uncontrolled type 2 diabetes mellitus with hyperglycemia  ([place]) Controlled with A1c of 8.6 Noncompliant with metformin due to GI side effects Substituted with  Farxiga Diabetic diet, lifestyle modifications - POCT glucose (manual entry) - dapagliflozin propanediol (FARXIGA) 10 MG TABS tablet; Take 10 mg by mouth daily.  Dispense: 30 tablet; Refill: 3 - glipiZIDE (GLUCOTROL) 10 MG tablet; Take 1 tablet (10 mg total) by mouth 2 (two) times daily before a meal.  Dispense: 60 tablet; Refill: 2  2. Chronic combined systolic (congestive) and diastolic (congestive) heart failure (HCC) EF 35% from 09/2017;NYHA II to III Euvolemic  Continue beta-blocker, ACE inhibitor  3. Coronary artery disease involving native coronary artery of native heart with unstable angina pectoris [facility]) S/p PCI to LAD with DES in 09/2017 Continue dual antiplatelet therapy with Effient and aspirin  4. Essential hypertension Increase blood pressure Yet to take antihypertensives Low sodium, DASH diet - Basic Metabolic Panel  5. Depression  Due to ongoing family stressors and coupled with medical conditions She is resistant to commencing and antihypertensive Also resistant to LCSW counseling; she knows to reach out to the clinic in the event that she changes her mind  Meds ordered this encounter  Medications  . dapagliflozin propanediol (FARXIGA) 10 MG TABS tablet    Sig: Take 10 mg by mouth daily.    Dispense:  30 tablet    Refill:  3    Discontinue Metformin  . glipiZIDE (GLUCOTROL) 10 MG tablet    Sig: Take 1 tablet (10 mg total) by mouth 2 (two) times daily before a meal.    Dispense:  60 tablet    Refill:  2    Follow-up: Return in about 3 months (around 03/02/2018) for Follow-up of chronic medical conditions.   [name] MD

## 2017-12-01 LAB — BASIC METABOLIC PANEL
BUN/Creatinine Ratio: 15 (ref 9–23)
BUN: 11 mg/dL (ref 6–24)
CO2: 23 mmol/L (ref 20–29)
CREATININE: 0.74 mg/dL (ref 0.57–1.00)
Calcium: 9.8 mg/dL (ref 8.7–10.2)
Chloride: 97 mmol/L (ref 96–106)
GFR calc Af Amer: 111 mL/min/{1.73_m2} (ref >59)
GFR calc non Af Amer: 96 mL/min/{1.73_m2} (ref >59)
GLUCOSE: 316 mg/dL — AB (ref 65–99)
POTASSIUM: 4.1 mmol/L (ref 3.5–5.2)
SODIUM: 135 mmol/L (ref 134–144)

## 2017-12-06 ENCOUNTER — Telehealth: Payer: Self-pay

## 2017-12-06 NOTE — Telephone Encounter (Signed)
Patient was called and informed of lab results. 

## 2017-12-06 NOTE — Telephone Encounter (Signed)
-----   Message from [name], MD sent at 12/03/2017  1:27 PM EDT ----- Labs revealed elevated glucose.  Please encourage compliance with diabetic regimen

## 2017-12-10 ENCOUNTER — Ambulatory Visit: Payer: Self-pay | Attending: Family Medicine

## 2017-12-25 ENCOUNTER — Ambulatory Visit: Payer: Self-pay

## 2017-12-27 ENCOUNTER — Encounter: Payer: Self-pay | Admitting: Cardiology

## 2017-12-27 ENCOUNTER — Ambulatory Visit (INDEPENDENT_AMBULATORY_CARE_PROVIDER_SITE_OTHER): Payer: Self-pay | Admitting: Cardiology

## 2017-12-27 VITALS — BP 184/95 | HR 91 | Ht 69.0 in | Wt 229.8 lb

## 2017-12-27 DIAGNOSIS — E1165 Type 2 diabetes mellitus with hyperglycemia: Secondary | ICD-10-CM

## 2017-12-27 DIAGNOSIS — Z91199 Patient's noncompliance with other medical treatment and regimen due to unspecified reason: Secondary | ICD-10-CM

## 2017-12-27 DIAGNOSIS — I2102 ST elevation (STEMI) myocardial infarction involving left anterior descending coronary artery: Secondary | ICD-10-CM

## 2017-12-27 DIAGNOSIS — I5030 Unspecified diastolic (congestive) heart failure: Secondary | ICD-10-CM

## 2017-12-27 DIAGNOSIS — I110 Hypertensive heart disease with heart failure: Secondary | ICD-10-CM

## 2017-12-27 DIAGNOSIS — I25119 Atherosclerotic heart disease of native coronary artery with unspecified angina pectoris: Secondary | ICD-10-CM

## 2017-12-27 DIAGNOSIS — E785 Hyperlipidemia, unspecified: Secondary | ICD-10-CM

## 2017-12-27 DIAGNOSIS — Z9119 Patient's noncompliance with other medical treatment and regimen: Secondary | ICD-10-CM

## 2017-12-27 DIAGNOSIS — E1169 Type 2 diabetes mellitus with other specified complication: Secondary | ICD-10-CM

## 2017-12-27 MED ORDER — CARVEDILOL 12.5 MG PO TABS: 12.5000 mg | ORAL_TABLET | Freq: Two times a day (BID) | ORAL | 6 refills | Status: DC

## 2017-12-27 MED FILL — PRASUGREL HCL 10 MG TABS: 10 | 30 days supply | Qty: 30 | Fill #1

## 2017-12-27 MED FILL — CARVEDILOL 12.5 MG TABLET: 12.5 | 30 days supply | Qty: 60 | Fill #0

## 2017-12-27 MED FILL — TRUE METRIX TEST STRIP: 25 days supply | Qty: 100 | Fill #1

## 2017-12-27 MED FILL — TRUEplus LANCETS 28G MISC: 25 days supply | Qty: 100 | Fill #1

## 2017-12-27 MED FILL — HYDROCHLOROTHIAZIDE 12.5 MG: 12.5 | 30 days supply | Qty: 30 | Fill #1

## 2017-12-27 MED FILL — ATORVASTATIN CALCIUM 40 MG: 40 | 30 days supply | Qty: 30 | Fill #0

## 2017-12-27 MED FILL — glipiZIDE 10 MG TABS: 10 | 30 days supply | Qty: 60 | Fill #1

## 2017-12-27 MED FILL — LISINOPRIL 10 MG TABS: 10 | 30 days supply | Qty: 30 | Fill #1

## 2017-12-27 NOTE — Progress Notes (Signed)
PCP: [name], MD  Clinic Note: Chief Complaint  Patient presents with  . Follow-up    Recent anterior STEMI, cardiomyopathy  . Dizziness    occasionally  . Shortness of Breath    rarely  . Leg Pain    when standing.    HPI: Amy Chaney is a 47 y.o. female with a PMH below who presents today for ~2 month f/u for CAD-STEMI,  CRFs include: HTN, HLD, DM and tobacco abuse  She is a history of CTO PCI to the RCA in July 2014 this was comp gated by subacute stent thrombosis --> she then was temporarily seen by Dr. Fransico Him and Richardson Dopp, [place] in 2017, but lost to follow-up.  She was admitted between March 12-17th with an anterior STEMI that was somewhat subtle in presentation.  She had significant disease in the LAD-D2 bifurcation.  Unfortunately this was much more significant than initially determined and there was dissection involving this bifurcation.  I tried to do bifurcation stenting, but was not able to maintain patency of the diagonal branch.  To cover the dissection in the LAD (could have been intramural hematoma), extensive LAD stenting was performed.  She did have prolonged fluoroscopy.  Amy Chaney was last seen on March 25 by Mr. Curly [place], [place] -she had no specific complaints.  Intermittent orthopnea without-PND.  Noted stable home weights.  May be gained 2 pounds.  Was able to do household activities without issues and only had one episode of dyspnea with extreme exertion.  Was euvolemic on exam.  No chest pain.  Was planning to go back to work.  She denied any wounds or sores on her back, no burning sensation.  Recent Hospitalizations:   None since discharge  Studies Personally Reviewed - (if available, images/films reviewed: From Epic Chart or Care Everywhere)  Transthoracic echo: EF 35% with mild concentric hypertrophy.  GRII DD.  Anteroseptal, anterior and anterolateral walls hypokinetic.  Moderate MR.  Cardiac cath-PCI on October 10, 2017: Successful  PCI of LAD, but was unable to restore flow down the diagonal that was stented as well.  Likely related to downstream dissection, but unable to rewire.  LAD PCI: STENT SYNERGY DES 3.5X32 (p),STENT SYNERGY DES 3.5X 8 (m), STENT SYNERGY DES 3.5X28 (d)  D2 PCI: STENT SYNERGY DES 2.5X24.  (UNABL TO REWIRE & EXPAND - TO OF DIAG AT END OF CASE)  Diagnostic Diagram      Post-Intervention Diagram       Interval History: Unfortunately Amy Chaney presents here today basically not taking her prescribed medications.  She ran out of her initial prescriptions and did not have any to fill her meds.  She has been off most of them for just about 2 weeks.  The only medicine she still has some remaining of is metoprolol succinate.  She has not been taking Effient (despite knowing what can happen if she does not take it, she says that she never got it refilled because she could not afford it and did not know to call our office) she has not been taking.  We will her statin, ACE inhibitor or diabetes medications.  She has not had any significant symptoms besides having some orthopnea and tachypalpitations when lying down that makes her chest feel abnormal but no anginal pain.  Thankfully has not had any anginal symptoms.   She has had some orthopnea but not real PND symptoms..  She does have intermittent headaches and blurred vision, likely because of high  blood pressure. No edema. No prolonged rapid irregular heartbeats.  No syncope/near syncope or TIA/amaurosis fugax.  No melena extremities no hematuria or epistaxis.  ROS: A comprehensive was performed. Review of Systems  Constitutional: Positive for malaise/fatigue.  HENT: Negative for congestion and nosebleeds.   Respiratory: Positive for shortness of breath (Off and on with exertion).   Cardiovascular: Negative for claudication.  Gastrointestinal: Positive for nausea. Negative for abdominal pain, blood in stool and melena.  Musculoskeletal: Negative for joint  pain.  Neurological: Positive for dizziness (Per HPI).  Endo/Heme/Allergies: Negative for environmental allergies.  Psychiatric/Behavioral: Negative.   All other systems reviewed and are negative.  I have reviewed and (if needed) personally updated the patient's problem list, medications, allergies, past medical and surgical history, social and family history.   Past Medical History:  Diagnosis Date  . Coronary artery disease involving native coronary artery of native heart with angina pectoris ([place]) 12/09/2014   a. s/p PCI of the mid RCA 01/2013 //  b. LHC 9/14: EF 55%, RCA stent 100% occluded, LAD irregularities >>  subacute stent thrombosis, Promus DES PCI distal overlap // c) 09/2017 - Ant STEMI - LAD-D2 PCI - Successful PCI of LAD (3 overlapping DES), unable to restore flow down D2l that was stented as well.  Likely related to downstream dissection, but unable to rewire.  . Daily headache   . Depression   . Dyslipidemia, goal LDL below 70   . History of Doppler ultrasound    carotid bruit >> a. Carotid US 6/17: bilat ICA 1-39%  . Hypertension   . Prolonged Q-T interval on ECG 01/11/2016  . STEMI involving left anterior descending coronary artery ([place]) 09/2017   Severe [place] 1, 1, 1 LAD-D2 lesion (complicated by post PTCA dissection/intramural hematoma) -successful extensive PCI of the LAD but unable to maintain patency of the stented D2.   Marland Kitchen STEMI involving oth coronary artery of inferior wall ([place]) 2014  . Tobacco abuse 01/11/2016  . Type II diabetes mellitus ([place]) 12/2010   sister and son also diabetic     Past Surgical History:  Procedure Laterality Date  . CHOLECYSTECTOMY  ~ 2000  . CORONARY STENT INTERVENTION N/A 10/10/2017   Procedure: CORONARY STENT INTERVENTION;  Surgeon: Leonie Man, MD;  Location: [facility];  Service: Cardiovascular; LAD PCI: STENT SYNERGY DES 3.5X32 (p),STENT SYNERGY DES 3.5X 8 (m), STENT SYNERGY DES 3.5X28 (d).  D2 PCI: STENT SYNERGY DES  2.5X24.  (UNABL TO REWIRE & EXPAND - TO OF DIAG AT END OF CASE)  . LEFT HEART CATH AND CORONARY ANGIOGRAPHY N/A 10/10/2017   Procedure: LEFT HEART CATH AND CORONARY ANGIOGRAPHY;  Surgeon: Leonie Man, MD;  Location: [facility];  Service: Cardiovascular;  Laterality: N/A; -patent RCA stents with mild in-stent restenosis. [place] 1,1,1 mLD-D2 03% (complicated by dissection/intramural thrombus)  . LEFT HEART CATHETERIZATION WITH CORONARY ANGIOGRAM N/A 02/25/2013   Procedure: LEFT HEART CATHETERIZATION WITH CORONARY ANGIOGRAM;  Surgeon: Laverda Page, MD;  Location: [facility] CATH LAB;  Service: Cardiovascular;; CTO m RCA; otherwise normal coronaries  . LEFT HEART CATHETERIZATION WITH CORONARY ANGIOGRAM N/A 04/01/2013   Procedure: LEFT HEART CATHETERIZATION WITH CORONARY ANGIOGRAM;  Surgeon: Laverda Page, MD;  Location: [facility] CATH LAB;  Service: Cardiovascular: 100% occlusion of distal RCA stent (subacute stent thrombosis -  secondary to stopping Effient).  Overlapping DES PCI  . NM MYOVIEW LTD  12/2015    EF 38%.  Hypertensive response to exercise (231/132 mmHg).  INTERMEDIATE  RISK due to -diffuse hypokinesis and reduced EF.  No ischemia or infarction.  Marland Kitchen PERCUTANEOUS CORONARY STENT INTERVENTION (PCI-S)  04/01/2013   Procedure: PERCUTANEOUS CORONARY STENT INTERVENTION (PCI-S);  Surgeon: Laverda Page, MD;  Location: [facility] CATH LAB;  Service: Cardiovascular;; PCI of distal RCA stent subacute thrombosis: Promus Premier DES 3.0 mm x 20 mm.  Marland Kitchen PERCUTANEOUS CORONARY STENT INTERVENTION (PCI-S)  02/25/2013   RCA CTO PCI with overlapping Promus DES: 3.0 mm x 38 mm, 3.5 mm x 53m  . TRANSTHORACIC ECHOCARDIOGRAM  12/2015   mild LVH, EF 55-60%, no RWMA, Gr 1 DD, mild MR  . TRANSTHORACIC ECHOCARDIOGRAM  09/2017   In setting of anterior STEMI:  EF 35% with mild concentric hypertrophy.  GRII DD.  Anteroseptal, anterior and anterolateral walls hypokinetic.  Moderate MR.  . TUBAL LIGATION  1994    Current  Meds  Medication Sig  . aspirin EC 81 MG tablet Take 1 tablet (81 mg total) by mouth daily.  .Marland Kitchenatorvastatin (LIPITOR) 40 MG tablet Take 1 tablet (40 mg total) by mouth daily.  . Blood Glucose Monitoring Suppl (TRUE METRIX METER) DEVI 1 kit by Does not apply route 4 (four) times daily.  .Marland KitchenglipiZIDE (GLUCOTROL) 10 MG tablet Take 1 tablet (10 mg total) by mouth 2 (two) times daily before a meal.  . glucose blood (TRUE METRIX BLOOD GLUCOSE TEST) test strip Use as instructed  . hydrochlorothiazide (MICROZIDE) 12.5 MG capsule Take 1 capsule (12.5 mg total) by mouth daily.  .Marland Kitchenlisinopril (PRINIVIL,ZESTRIL) 10 MG tablet Take 1 tablet (10 mg total) by mouth daily.  . nitroGLYCERIN (NITROSTAT) 0.4 MG SL tablet Place 1 tablet (0.4 mg total) under the tongue every 5 (five) minutes as needed for chest pain. Reported on 11/25/2015  . prasugrel (EFFIENT) 10 MG TABS tablet Take 1 tablet (10 mg total) by mouth daily.  . TRUEPLUS LANCETS 28G MISC 28 g by Does not apply route 4 (four) times daily.  . [DISCONTINUED] dapagliflozin propanediol (FARXIGA) 10 MG TABS tablet Take 10 mg by mouth daily.  . [DISCONTINUED] metoprolol succinate (TOPROL-XL) 50 MG 24 hr tablet Take 1 tablet (50 mg total) by mouth 2 (two) times daily. Take with or immediately following a meal.    No Known Allergies  Social History   Tobacco Use  . Smoking status: Current Every Day Smoker    Packs/day: 0.50    Years: 15.00    Pack years: 7.50    Types: Cigarettes  . Smokeless tobacco: Never Used  . Tobacco comment: states she has cut back to 5-6 cigarettes/day   Substance Use Topics  . Alcohol use: Yes    Alcohol/week: 0.0 oz    Comment: 02/25/2013 "only on special occasions; maybe once/yr"  . Drug use: Yes    Types: Marijuana    Comment: 02/25/2013 "quit marijuana ~ 2001"   Social History   Social History Narrative  . Not on file    family history includes Diabetes in her mother, sister, sister, son, and son.  Wt Readings from  Last 3 Encounters:  12/27/17 229 lb 12.8 oz (104.2 kg)  11/30/17 226 lb 3.2 oz (102.6 kg)  10/24/17 232 lb 9.6 oz (105.5 kg)    PHYSICAL EXAM BP (!) 184/95   Pulse 91   Ht 5' 9"  (1.753 m)   Wt 229 lb 12.8 oz (104.2 kg)   LMP 10/20/2015   BMI 33.94 kg/m  Physical Exam  Constitutional: She is oriented to person, place, and time.  She appears well-developed and well-nourished.  Mildly obese.  Well-groomed.  HENT:  Head: Normocephalic and atraumatic.  Neck: Normal range of motion. Neck supple. JVD (Roughly 8-9 cm water.) present. No hepatojugular reflux present. Carotid bruit is not present.  Cardiovascular: Normal rate, regular rhythm, S1 normal, S2 normal, intact distal pulses and normal pulses.  Occasional extrasystoles are present. PMI is not displaced (Unable to palpate). Exam reveals gallop and S4.  Murmur heard.  Low-pitched blowing holosystolic murmur is present with a grade of 1/6 at the apex. Pulmonary/Chest: Effort normal and breath sounds normal. No respiratory distress. She has no wheezes. She has no rales.  Abdominal: Soft. Bowel sounds are normal. She exhibits no distension. There is no tenderness. There is no rebound.  No HSM.  Musculoskeletal: Normal range of motion. She exhibits edema (Trivial bilateral pedal).  Neurological: She is alert and oriented to person, place, and time.  Psychiatric: She has a normal mood and affect. Her behavior is normal. Thought content normal.  Normal mood and affect, but seems to be somewhat nave despite her prior history as to the importance of taking her medications.  She would like to go back to work, but does not have the possibility of medication that she has her medications.  Vitals reviewed.    Adult ECG Report Not checked  Other studies Reviewed: Additional studies/ records that were reviewed today include:  Recent Labs:   Lab Results  Component Value Date   CREATININE 0.74 11/30/2017   BUN 11 11/30/2017   NA 135  11/30/2017   K 4.1 11/30/2017   CL 97 11/30/2017   CO2 23 11/30/2017   Lab Results  Component Value Date   CHOL 165 10/10/2017   HDL 52 10/10/2017   LDLCALC 106 (H) 10/10/2017   TRIG 35 10/10/2017   CHOLHDL 3.2 10/10/2017   Lab Results  Component Value Date   HGBA1C 8.6 (H) 10/10/2017    ASSESSMENT / PLAN: Problem List Items Addressed This Visit    STEMI involving left anterior descending coronary artery (HCC) (Chronic)    Relatively large MI with EF 35% now on echo.  Unfortunately some of this may not recover given the inability to revascularize the diagonal branch.  No longer having any angina. Converted from Toprol to carvedilol, will restart lisinopril and statin.   Continue aspirin and ensure that she is on an antiplatelet agent whether it is Brilinta or Effient.  (Apparently she can get her Effient from [facility] which she needs to feel ASAP). We are giving the prescription for Brilinta to take until she is able to get the Effient.      Relevant Medications   carvedilol (COREG) 12.5 MG tablet   Hypertension, accelerated, with diastolic congestive heart failure, NYHA class 1 (HCC) (Chronic)    Probably class I to class II diastolic dysfunction.  Her blood pressure today is 184/95 and she has a history of difficult to control hypertension with hypertensive response during exercise in the past.   Restart lisinopril plan: Convert from Toprol to carvedilol 12.5 mg twice daily; at 10 mg, likely anticipate titrating up to 20. Continue HCTZ, but expect that we would probably require more aggressive management with further titrations of beta-blocker and possibly adding calcium channel blocker Thankfully, she seems to be a relatively make and does not require Lasix at this time, but low threshold to convert from HCTZ to Lasix.    She will be seen in 1 month here in our CV  [facility] for medication titration as well as lipid  management  Needs Social Work/Case Management      Relevant Medications   carvedilol (COREG) 12.5 MG tablet   Hyperlipidemia associated with type 2 diabetes mellitus (HCC) (Chronic)    Target LDL is 50-70 with goal less than 50.  Not taking her atorvastatin.  We will really fill the prescription for atorvastatin and try to make sure that she is getting her medications from [facility]. Low threshold to increase to 80 mg and potentially consider converting to Crestor if not reaching target.  Can reassess check.  When she is seen in CV RRR for blood pressure      Relevant Medications   carvedilol (COREG) 12.5 MG tablet   H/O noncompliance with medical treatment, presenting hazards to health (Chronic)    Unbelievable that she is not on antiplatelet therapy despite what happened 5 years ago   With acute stent thrombosis.  She now has extensive DES stents in both the RCA and LAD and cannot afford to be off her antiplatelet agent.  I counseled her vigorously on the importance of at least taking her antiplatelet agent.    This is not to diminish the importance of taking her antihypertensive agents as well as lipid and diabetic medications as well.      Diabetes type 2, uncontrolled (HCC) (Chronic)    Glipizide only which is probably adequate. Best treatment option, CAD and low EF/heart failure would be Jardiance.  Would clearly need medical assistance for this.-       Coronary artery disease involving native coronary artery of native heart with angina pectoris ([place]) - Primary (Chronic)    Unfortunately, she had another MI.  Thankfully her RCA stents were open, but she had pretty significant bifurcation lesion.  Despite aggressive efforts I was unable to revascularize a diagonal branch and had to put significant stents in the LAD.  I am very concerned that she is no longer on antiplatelet agent. Unfortunately we do not have samples here in the office.  I will give her a one-month  prescription for Brilinta -- she can use the 1 month free card until she is able to refill her Effient from the [facility].  She will need to take a loading dose of Brilinta 180 mg the first dose and then 1 tablet twice a day.  Once she is able to start on Effient, she would not need to double dose.  I cannot stress enough the importance of her being on these medications.  She cannot stop her antiplatelet agent  She is not On her ACE inhibitor or statin which will need to restart as well.  I stressed the importance of being back on these medications. Thankfully, despite all this she has not had any recurrent anginal symptoms. I am going to convert her from metoprolol to carvedilol for better blood pressure control and cardia myopathy management.  Target LDL is to be closer to 50 and currently is 106.  She will restart statin and we can reassess in 3 months -   Low threshold considering referral for PCSK9 inhibitor (however the cost will be an issue)        Relevant Medications   carvedilol (COREG) 12.5 MG tablet       I spent a total of 45 minutes with the patient and chart review. >  50% of the time was spent in direct patient consultation.   Current  medicines are reviewed at length with the patient today.  (+/- concerns) unable to get her medications.  We need to address this immediately. The following changes have been made:  See below  Patient Instructions  MEDICATION INSTRUCTIONS  TAKE BRILINTA 90 MG TWICE A DAY UNTIL YOU ARE ABLE TO GET EFFIENT ( PRASGUREL )10 MG FILLED THEN STOP TAKING THE BRILINTA. FIRST DOSE OF BRILINTA TAKE 2 TABLETS AFTER TAKE 1 TABLET TWICE A DAY.   STOP TAKING METOPROLOL SUCCINATE   START TAKING  ( COREG) CARVEDILOL 12.5 MG TWICE A DAY    CONTACT [facility] TO PICK MEDICATIONS UP TODAY     Your physician recommends that you schedule a follow-up appointment in 1 MONTH WITH CVRR- BLOOD PRESSURE  Your  physician recommends that you schedule a follow-up appointment in [place].     Studies Ordered:   No orders of the defined types were placed in this encounter.     Glenetta Hew, M.D., M.S. Interventional Cardiologist   Pager # [phone] Phone # [phone] [address] [place], [place] [address]   Thank you for choosing [facility] at [facility]!!

## 2017-12-27 NOTE — Patient Instructions (Addendum)
MEDICATION INSTRUCTIONS  TAKE BRILINTA 90 MG TWICE A DAY UNTIL YOU ARE ABLE TO GET EFFIENT ( PRASGUREL )10 MG FILLED THEN STOP TAKING THE BRILINTA. FIRST DOSE OF BRILINTA TAKE 2 TABLETS AFTER TAKE 1 TABLET TWICE A DAY.   STOP TAKING METOPROLOL SUCCINATE   START TAKING  ( COREG) CARVEDILOL 12.5 MG TWICE A DAY    CONTACT [facility] TO PICK MEDICATIONS UP TODAY     Your physician recommends that you schedule a follow-up appointment in 1 MONTH WITH CVRR- BLOOD PRESSURE  Your physician recommends that you schedule a follow-up appointment in [place].

## 2017-12-30 ENCOUNTER — Encounter: Payer: Self-pay | Admitting: Cardiology

## 2017-12-30 DIAGNOSIS — I5030 Unspecified diastolic (congestive) heart failure: Secondary | ICD-10-CM

## 2017-12-30 DIAGNOSIS — I110 Hypertensive heart disease with heart failure: Secondary | ICD-10-CM | POA: Insufficient documentation

## 2017-12-30 DIAGNOSIS — I161 Hypertensive emergency: Secondary | ICD-10-CM | POA: Insufficient documentation

## 2017-12-30 NOTE — Assessment & Plan Note (Signed)
Relatively large MI with EF 35% now on echo.  Unfortunately some of this may not recover given the inability to revascularize the diagonal branch.  No longer having any angina. Converted from Toprol to carvedilol, will restart lisinopril and statin.   Continue aspirin and ensure that she is on an antiplatelet agent whether it is Brilinta or Effient.  (Apparently she can get her Effient from [facility] which she needs to feel ASAP). We are giving the prescription for Brilinta to take until she is able to get the Effient.

## 2017-12-30 NOTE — Assessment & Plan Note (Signed)
Unbelievable that she is not on antiplatelet therapy despite what happened 5 years ago   With acute stent thrombosis.  She now has extensive DES stents in both the RCA and LAD and cannot afford to be off her antiplatelet agent.  I counseled her vigorously on the importance of at least taking her antiplatelet agent.    This is not to diminish the importance of taking her antihypertensive agents as well as lipid and diabetic medications as well.

## 2017-12-30 NOTE — Assessment & Plan Note (Signed)
Unfortunately, she had another MI.  Thankfully her RCA stents were open, but she had pretty significant bifurcation lesion.  Despite aggressive efforts I was unable to revascularize a diagonal branch and had to put significant stents in the LAD.  I am very concerned that she is no longer on antiplatelet agent. Unfortunately we do not have samples here in the office.  I will give her a one-month prescription for Brilinta -- she can use the 1 month free card until she is able to refill her Effient from the [facility].  She will need to take a loading dose of Brilinta 180 mg the first dose and then 1 tablet twice a day.  Once she is able to start on Effient, she would not need to double dose.  I cannot stress enough the importance of her being on these medications.  She cannot stop her antiplatelet agent  She is not On her ACE inhibitor or statin which will need to restart as well.  I stressed the importance of being back on these medications. Thankfully, despite all this she has not had any recurrent anginal symptoms. I am going to convert her from metoprolol to carvedilol for better blood pressure control and cardia myopathy management.  Target LDL is to be closer to 50 and currently is 106.  She will restart statin and we can reassess in 3 months -   Low threshold considering referral for PCSK9 inhibitor (however the cost will be an issue)

## 2017-12-30 NOTE — Assessment & Plan Note (Signed)
Glipizide only which is probably adequate. Best treatment option, CAD and low EF/heart failure would be Jardiance.  Would clearly need medical assistance for this.-

## 2017-12-30 NOTE — Assessment & Plan Note (Addendum)
Target LDL is 50-70 with goal less than 50.  Not taking her atorvastatin.  We will really fill the prescription for atorvastatin and try to make sure that she is getting her medications from [facility]. Low threshold to increase to 80 mg and potentially consider converting to Crestor if not reaching target.  Can reassess check.  When she is seen in CV RRR for blood pressure

## 2017-12-30 NOTE — Assessment & Plan Note (Addendum)
Probably class I to class II diastolic dysfunction.  Her blood pressure today is 184/95 and she has a history of difficult to control hypertension with hypertensive response during exercise in the past.   Restart lisinopril plan: Convert from Toprol to carvedilol 12.5 mg twice daily; at 10 mg, likely anticipate titrating up to 20. Continue HCTZ, but expect that we would probably require more aggressive management with further titrations of beta-blocker and possibly adding calcium channel blocker Thankfully, she seems to be a relatively make and does not require Lasix at this time, but low threshold to convert from HCTZ to Lasix.    She will be seen in 1 month here in our CV RR -([facility]) for medication titration as well as lipid management  Needs Social Work/Case Management

## 2018-01-06 ENCOUNTER — Encounter (HOSPITAL_COMMUNITY): Payer: Self-pay | Admitting: Emergency Medicine

## 2018-01-06 ENCOUNTER — Emergency Department (HOSPITAL_COMMUNITY): Payer: Self-pay

## 2018-01-06 ENCOUNTER — Emergency Department (HOSPITAL_COMMUNITY)
Admission: EM | Admit: 2018-01-06 | Discharge: 2018-01-06 | Disposition: A | Discharge status: Home / Self Care | Payer: Self-pay | Attending: Emergency Medicine | Admitting: Emergency Medicine

## 2018-01-06 DIAGNOSIS — Z7902 Long term (current) use of antithrombotics/antiplatelets: Secondary | ICD-10-CM | POA: Insufficient documentation

## 2018-01-06 DIAGNOSIS — I5042 Chronic combined systolic (congestive) and diastolic (congestive) heart failure: Secondary | ICD-10-CM | POA: Insufficient documentation

## 2018-01-06 DIAGNOSIS — R0789 Other chest pain: Secondary | ICD-10-CM | POA: Insufficient documentation

## 2018-01-06 DIAGNOSIS — Z7982 Long term (current) use of aspirin: Secondary | ICD-10-CM | POA: Insufficient documentation

## 2018-01-06 DIAGNOSIS — I2510 Atherosclerotic heart disease of native coronary artery without angina pectoris: Secondary | ICD-10-CM | POA: Insufficient documentation

## 2018-01-06 DIAGNOSIS — I110 Hypertensive heart disease with heart failure: Secondary | ICD-10-CM | POA: Insufficient documentation

## 2018-01-06 DIAGNOSIS — Z955 Presence of coronary angioplasty implant and graft: Secondary | ICD-10-CM | POA: Insufficient documentation

## 2018-01-06 DIAGNOSIS — I252 Old myocardial infarction: Secondary | ICD-10-CM | POA: Insufficient documentation

## 2018-01-06 DIAGNOSIS — R739 Hyperglycemia, unspecified: Secondary | ICD-10-CM | POA: Insufficient documentation

## 2018-01-06 DIAGNOSIS — F17210 Nicotine dependence, cigarettes, uncomplicated: Secondary | ICD-10-CM | POA: Insufficient documentation

## 2018-01-06 DIAGNOSIS — R079 Chest pain, unspecified: Secondary | ICD-10-CM

## 2018-01-06 DIAGNOSIS — Z7984 Long term (current) use of oral hypoglycemic drugs: Secondary | ICD-10-CM | POA: Insufficient documentation

## 2018-01-06 DIAGNOSIS — E119 Type 2 diabetes mellitus without complications: Secondary | ICD-10-CM | POA: Insufficient documentation

## 2018-01-06 LAB — CBC
HCT: 40.8 % (ref 36.0–46.0)
HEMOGLOBIN: 12.9 g/dL (ref 12.0–15.0)
MCH: 26 pg (ref 26.0–34.0)
MCHC: 31.6 g/dL (ref 30.0–36.0)
MCV: 82.3 fL (ref 78.0–100.0)
PLATELETS: 354 10*3/uL (ref 150–400)
RBC: 4.96 MIL/uL (ref 3.87–5.11)
RDW: 13.9 % (ref 11.5–15.5)
WBC: 7.7 10*3/uL (ref 4.0–10.5)

## 2018-01-06 LAB — BASIC METABOLIC PANEL
ANION GAP: 10 (ref 5–15)
BUN: 9 mg/dL (ref 6–20)
CALCIUM: 8.9 mg/dL (ref 8.9–10.3)
CO2: 22 mmol/L (ref 22–32)
CREATININE: 0.75 mg/dL (ref 0.44–1.00)
Chloride: 99 mmol/L — ABNORMAL LOW (ref 101–111)
GFR calc non Af Amer: >60 mL/min (ref >60)
Glucose, Bld: 392 mg/dL — ABNORMAL HIGH (ref 65–99)
Potassium: 4.1 mmol/L (ref 3.5–5.1)
SODIUM: 131 mmol/L — AB (ref 135–145)

## 2018-01-06 LAB — I-STAT TROPONIN, ED
TROPONIN I, POC: 0 ng/mL (ref 0.00–0.08)
TROPONIN I, POC: 0.02 ng/mL (ref 0.00–0.08)

## 2018-01-06 LAB — D-DIMER, QUANTITATIVE: D-Dimer, Quant: <0.27 ug/mL-FEU (ref 0.00–0.50)

## 2018-01-06 LAB — I-STAT BETA HCG BLOOD, ED (MC, WL, AP ONLY): I-stat hCG, quantitative: <5 m[IU]/mL (ref <5)

## 2018-01-06 MED ORDER — NITROGLYCERIN IN D5W 200-5 MCG/ML-% IV SOLN
0.0000 ug/min | Freq: Once | INTRAVENOUS | Status: AC
  Administered 2018-01-06: 5 ug/min via INTRAVENOUS
  Filled 2018-01-06: qty 250

## 2018-01-06 NOTE — Consult Note (Signed)
Cardiology Consult    Patient ID: DEZERAE FREIBERGER MRN: [id number], DOB/AGE: 1971/03/06   Admit date: 01/06/2018 Date of Consult: 01/06/2018  Primary Physician: [name], MD Primary Cardiologist: Glenetta Hew, MD  Patient Profile    BRONDA ALFRED is a 47 y.o. female with a history of  CAD , who is being seen today for the evaluation of CP  at the request of Dr. Cathleen Fears  Past Medical History   Past Medical History:  Diagnosis Date  . Coronary artery disease involving native coronary artery of native heart with angina pectoris ([place]) 12/09/2014   a. s/p PCI of the mid RCA 01/2013 //  b. LHC 9/14: EF 55%, RCA stent 100% occluded, LAD irregularities >>  subacute stent thrombosis, Promus DES PCI distal overlap // c) 09/2017 - Ant STEMI - LAD-D2 PCI - Successful PCI of LAD (3 overlapping DES), unable to restore flow down D2l that was stented as well.  Likely related to downstream dissection, but unable to rewire.  . Daily headache   . Depression   . Dyslipidemia, goal LDL below 70   . History of Doppler ultrasound    carotid bruit >> a. Carotid US 6/17: bilat ICA 1-39%  . Hypertension   . Prolonged Q-T interval on ECG 01/11/2016  . STEMI involving left anterior descending coronary artery ([place]) 09/2017   Severe Medina 1, 1, 1 LAD-D2 lesion (complicated by post PTCA dissection/intramural hematoma) -successful extensive PCI of the LAD but unable to maintain patency of the stented D2.   Marland Kitchen STEMI involving oth coronary artery of inferior wall ([place]) 2014  . Tobacco abuse 01/11/2016  . Type II diabetes mellitus ([place]) 12/2010   sister and son also diabetic     Past Surgical History:  Procedure Laterality Date  . CHOLECYSTECTOMY  ~ 2000  . CORONARY STENT INTERVENTION N/A 10/10/2017   Procedure: CORONARY STENT INTERVENTION;  Surgeon: Leonie Man, MD;  Location: [place];  Service: Cardiovascular; LAD PCI: STENT SYNERGY DES 3.5X32 (p),STENT SYNERGY DES 3.5X 8 (m), STENT  SYNERGY DES 3.5X28 (d).  D2 PCI: STENT SYNERGY DES 2.5X24.  (UNABL TO REWIRE & EXPAND - TO OF DIAG AT END OF CASE)  . LEFT HEART CATH AND CORONARY ANGIOGRAPHY N/A 10/10/2017   Procedure: LEFT HEART CATH AND CORONARY ANGIOGRAPHY;  Surgeon: Leonie Man, MD;  Location: [facility];  Service: Cardiovascular;  Laterality: N/A; -patent RCA stents with mild in-stent restenosis. Medina 1,1,1 mLD-D2 08% (complicated by dissection/intramural thrombus)  . LEFT HEART CATHETERIZATION WITH CORONARY ANGIOGRAM N/A 02/25/2013   Procedure: LEFT HEART CATHETERIZATION WITH CORONARY ANGIOGRAM;  Surgeon: Laverda Page, MD;  Location: [facility] CATH LAB;  Service: Cardiovascular;; CTO m RCA; otherwise normal coronaries  . LEFT HEART CATHETERIZATION WITH CORONARY ANGIOGRAM N/A 04/01/2013   Procedure: LEFT HEART CATHETERIZATION WITH CORONARY ANGIOGRAM;  Surgeon: Laverda Page, MD;  Location: [facility] CATH LAB;  Service: Cardiovascular: 100% occlusion of distal RCA stent (subacute stent thrombosis -  secondary to stopping Effient).  Overlapping DES PCI  . NM MYOVIEW LTD  12/2015    EF 38%.  Hypertensive response to exercise (231/132 mmHg).  INTERMEDIATE RISK due to -diffuse hypokinesis and reduced EF.  No ischemia or infarction.  Marland Kitchen PERCUTANEOUS CORONARY STENT INTERVENTION (PCI-S)  04/01/2013   Procedure: PERCUTANEOUS CORONARY STENT INTERVENTION (PCI-S);  Surgeon: Laverda Page, MD;  Location[facility] CATH LAB;  Service: Cardiovascular;; PCI of distal RCA stent subacute thrombosis: Promus Premier DES 3.0 mm x  20 mm.  Marland Kitchen PERCUTANEOUS CORONARY STENT INTERVENTION (PCI-S)  02/25/2013   RCA CTO PCI with overlapping Promus DES: 3.0 mm x 38 mm, 3.5 mm x 47mm  . TRANSTHORACIC ECHOCARDIOGRAM  12/2015   mild LVH, EF 55-60%, no RWMA, Gr 1 DD, mild MR  . TRANSTHORACIC ECHOCARDIOGRAM  09/2017   In setting of anterior STEMI:  EF 35% with mild concentric hypertrophy.  GRII DD.  Anteroseptal, anterior and anterolateral walls hypokinetic.   Moderate MR.  . TUBAL LIGATION  1994     Allergies  No Known Allergies  History of Present Illness      Pt is a 47 yo with histeroy of CAD   She is followed by D harding in cardiology clinic  She has a history of PCI to RCA in July 9528 (CTO)  Complicated by subacute thrombosis.    Lost to f/u for a period of time  In March 2019 she was admitted with anterior STEMI   Found to have significant diz in LAD/D2 bifucation.   There was a dissection at bifucation.   She underewent extensive stenting of LAD She was seen on Mar 25 by B Bhagat without complaints.    She was seen by Roni Bread on May 30  No CP   Compliance stressed   This morning she woke up about 9 am with L sided CP   She felt ok yesterday  Denies injury or over exertion   No fevers or cough Pain is subxiphoid and L lateral   Not as severe as when had MI earlier this year.   Alos it is pleuritic now   Worse with inspiration  It was not like this before  Family History    Family History  Problem Relation Age of Onset  . Diabetes Mother   . Diabetes Sister   . Diabetes Sister   . Diabetes Son        type 1  . Diabetes Son        type 2   indicated that her mother is deceased. She indicated that her father is deceased. She indicated that both of her sisters are alive. She indicated that her brother is alive.   Social History    Social History   Socioeconomic History  . Marital status: Single    Spouse name: Not on file  . Number of children: Not on file  . Years of education: Not on file  . Highest education level: Not on file  Occupational History  . Not on file  Social Needs  . Financial resource strain: Not on file  . Food insecurity:    Worry: Not on file    Inability: Not on file  . Transportation needs:    Medical: Not on file    Non-medical: Not on file  Tobacco Use  . Smoking status: Current Every Day Smoker    Packs/day: 0.50    Years: 15.00    Pack years: 7.50    Types: Cigarettes  .  Smokeless tobacco: Never Used  . Tobacco comment: states she has cut back to 5-6 cigarettes/day   Substance and Sexual Activity  . Alcohol use: Yes    Alcohol/week: 0.0 oz    Comment: 02/25/2013 "only on special occasions; maybe once/yr"  . Drug use: Yes    Types: Marijuana    Comment: 02/25/2013 "quit marijuana ~ 2001"  . Sexual activity: Not Currently    Birth control/protection: Surgical  Lifestyle  . Physical activity:  Days per week: Not on file    Minutes per session: Not on file  . Stress: Not on file  Relationships  . Social connections:    Talks on phone: Not on file    Gets together: Not on file    Attends religious service: Not on file    Active member of club or organization: Not on file    Attends meetings of clubs or organizations: Not on file    Relationship status: Not on file  . Intimate partner violence:    Fear of current or ex partner: Not on file    Emotionally abused: Not on file    Physically abused: Not on file    Forced sexual activity: Not on file  Other Topics Concern  . Not on file  Social History Narrative  . Not on file     Review of Systems    General:  No chills, fever, night sweats or weight changes.  Cardiovascular:  No chest pain, dyspnea on exertion, edema, orthopnea, palpitations, paroxysmal nocturnal dyspnea. Dermatological: No rash, lesions/masses Respiratory: No cough, dyspnea Urologic: No hematuria, dysuria Abdominal:   No nausea, vomiting, diarrhea, bright red blood per rectum, melena, or hematemesis Neurologic:  No visual changes, wkns, changes in mental status. All other systems reviewed and are otherwise negative except as noted above.  Physical Exam    Blood pressure (!) 108/53, pulse 60, temperature 98.2 F (36.8 C), temperature source Oral, resp. rate 16, height 5\' 9"  (1.753 m), weight 104.3 kg (230 lb), last menstrual period 10/20/2015, SpO2 100 %.  General: Pt is a morbidly obese 47 yo    Sl tearful with deep breath     Psych: Normal affect. Neuro: Alert and oriented X 3. Moves all extremities spontaneously. HEENT: Normal  Neck: Supple without bruits or JVD. Lungs:  Resp regular and unlabored, CTA. Heart: RRR no s3, s4, or murmurs. Chest   Very tender at xiphoid process and with palpitaiton of L ribs   Abdomen: Soft, non-tender, non-distended, BS + x 4.  Extremities: No clubbing, cyanosis or edema. DP/PT/Radials 2+ and equal bilaterally.  Labs    Troponin (Point of Care Test) Recent Labs    01/06/18 1240  TROPIPOC 0.00   No results for input(s): CKTOTAL, CKMB, TROPONINI in the last 72 hours. Lab Results  Component Value Date   WBC 7.7 01/06/2018   HGB 12.9 01/06/2018   HCT 40.8 01/06/2018   MCV 82.3 01/06/2018   PLT 354 01/06/2018    Recent Labs  Lab 01/06/18 1230  NA 131*  K 4.1  CL 99*  CO2 22  BUN 9  CREATININE 0.75  CALCIUM 8.9  GLUCOSE 392*   Lab Results  Component Value Date   CHOL 165 10/10/2017   HDL 52 10/10/2017   LDLCALC 106 (H) 10/10/2017   TRIG 35 10/10/2017   Lab Results  Component Value Date   DDIMER <0.27 04/01/2013     Radiology Studies    Dg Chest 2 View  Result Date: 01/06/2018 CLINICAL DATA:  Chest pain EXAM: CHEST - 2 VIEW COMPARISON:  October 10, 2017 FINDINGS: There is no edema or consolidation. The heart size and pulmonary vascularity are normal. No adenopathy. No bone lesions. There is a stent in the right coronary artery region. IMPRESSION: No edema or consolidation. Electronically Signed   By: Lowella Grip III M.D.   On: 01/06/2018 13:45    ECG & Cardiac Imaging    SR 82 bpm   Nospecific  ST changes    Assessment & Plan    Pt is a 47 yo with hx of CAD   WOke up with CP   1  CP  Atypical for cardiac  Pleuritic   WOrse with palpation   No acute ST changes  I do not think  Cardiac in origin.  APpears musculoskeletal. Check second troponin  (first 0 despite continuous pain)   Rx with antiinflammatory / tylenol and rest  2  Cardiac   Continue meds   Stressed importance of antiplt agents    3  HTN   BP is controlled    4  HL   Continue lipitor  5  DM    On oral agents    Signed, Dorris Carnes, MD 01/06/2018, 2:16 PM  For questions or updates, please contact   Please consult [email or web address] for contact info under Cardiology/STEMI.

## 2018-01-06 NOTE — ED Provider Notes (Addendum)
[facility] EMERGENCY DEPARTMENT Provider Note   CSN: [id number] Arrival date & time: 01/06/18  1213     History   Chief Complaint Chief Complaint  Patient presents with  . Chest Pain    HPI Amy Chaney is a 47 y.o. female.'s of left anterior chest pain radiating to left scapular area onset upon awakening 9:30 AM today pain is worse with deep inspiration, changing positions or with talking or walking.  Improved by anything and feels like a "heart attack" she is had in the past she treated herself with 3 sublingual nitroglycerin with transient relief.  Pain is constant.  She does admit to occasionally missing Effient, states she last missed a dose approximately 2 weeks ago.  She did take Effient and aspirin this morning.  Associated symptoms include shortness of breath, nausea, briefly.  No nausea now.  No diaphoresis.  No other associated symptoms  HPI  Past Medical History:  Diagnosis Date  . Coronary artery disease involving native coronary artery of native heart with angina pectoris ([place]) 12/09/2014   a. s/p PCI of the mid RCA 01/2013 //  b. LHC 9/14: EF 55%, RCA stent 100% occluded, LAD irregularities >>  subacute stent thrombosis, Promus DES PCI distal overlap // c) 09/2017 - Ant STEMI - LAD-D2 PCI - Successful PCI of LAD (3 overlapping DES), unable to restore flow down D2l that was stented as well.  Likely related to downstream dissection, but unable to rewire.  . Daily headache   . Depression   . Dyslipidemia, goal LDL below 70   . History of Doppler ultrasound    carotid bruit >> a. Carotid US 6/17: bilat ICA 1-39%  . Hypertension   . Prolonged Q-T interval on ECG 01/11/2016  . STEMI involving left anterior descending coronary artery ([place]) 09/2017   Severe Medina 1, 1, 1 LAD-D2 lesion (complicated by post PTCA dissection/intramural hematoma) -successful extensive PCI of the LAD but unable to maintain patency of the stented D2.   Marland Kitchen STEMI involving oth  coronary artery of inferior wall ([place]) 2014  . Tobacco abuse 01/11/2016  . Type II diabetes mellitus ([place]) 12/2010   sister and son also diabetic     Patient Active Problem List   Diagnosis Date Noted  . Hypertension, accelerated, with diastolic congestive heart failure, NYHA class 1 ([place]) 12/30/2017  . STEMI involving left anterior descending coronary artery ([place]) 10/10/2017  . MI, acute, non ST segment elevation ([place])   . Chest pain 01/11/2016  . Carotid artery disease ([place]) 01/11/2016  . Prolonged Q-T interval on ECG 01/11/2016  . Tobacco abuse counseling 01/11/2016  . Heart palpitations 01/11/2016  . Morbid obesity ([place]): BMI ~36 with HTN, HLD, CAD 11/25/2015  . Smoker 03/03/2015  . Coronary artery disease involving native coronary artery of native heart with angina pectoris ([place]) 12/09/2014  . Chronic combined systolic (congestive) and diastolic (congestive) heart failure ([place]) 12/09/2014  . H/O noncompliance with medical treatment, presenting hazards to health 12/09/2014  . Status post primary angioplasty with coronary stent 11/18/2014  . Diabetes type 2, uncontrolled ([place]) 04/01/2013  . Hyperlipidemia associated with type 2 diabetes mellitus ([place]) 04/01/2013  . Depression 04/01/2013  . Stress at home 04/01/2013  . Essential hypertension 09/05/2011    Past Surgical History:  Procedure Laterality Date  . CHOLECYSTECTOMY  ~ 2000  . CORONARY STENT INTERVENTION N/A 10/10/2017   Procedure: CORONARY STENT INTERVENTION;  Surgeon: Leonie Man, MD;  Location: [facility];  Service: Cardiovascular; LAD PCI: STENT SYNERGY DES 3.5X32 (p),STENT SYNERGY DES 3.5X 8 (m), STENT SYNERGY DES 3.5X28 (d).  D2 PCI: STENT SYNERGY DES 2.5X24.  (UNABL TO REWIRE & EXPAND - TO OF DIAG AT END OF CASE)  . LEFT HEART CATH AND CORONARY ANGIOGRAPHY N/A 10/10/2017   Procedure: LEFT HEART CATH AND CORONARY ANGIOGRAPHY;  Surgeon: Leonie Man, MD;  Location: [facility];  Service:  Cardiovascular;  Laterality: N/A; -patent RCA stents with mild in-stent restenosis. Medina 1,1,1 mLD-D2 41% (complicated by dissection/intramural thrombus)  . LEFT HEART CATHETERIZATION WITH CORONARY ANGIOGRAM N/A 02/25/2013   Procedure: LEFT HEART CATHETERIZATION WITH CORONARY ANGIOGRAM;  Surgeon: Laverda Page, MD;  Location: [facility] CATH LAB;  Service: Cardiovascular;; CTO m RCA; otherwise normal coronaries  . LEFT HEART CATHETERIZATION WITH CORONARY ANGIOGRAM N/A 04/01/2013   Procedure: LEFT HEART CATHETERIZATION WITH CORONARY ANGIOGRAM;  Surgeon: Laverda Page, MD;  Location: [facility] CATH LAB;  Service: Cardiovascular: 100% occlusion of distal RCA stent (subacute stent thrombosis -  secondary to stopping Effient).  Overlapping DES PCI  . NM MYOVIEW LTD  12/2015    EF 38%.  Hypertensive response to exercise (231/132 mmHg).  INTERMEDIATE RISK due to -diffuse hypokinesis and reduced EF.  No ischemia or infarction.  Marland Kitchen PERCUTANEOUS CORONARY STENT INTERVENTION (PCI-S)  04/01/2013   Procedure: PERCUTANEOUS CORONARY STENT INTERVENTION (PCI-S);  Surgeon: Laverda Page, MD;  Location: [facility] CATH LAB;  Service: Cardiovascular;; PCI of distal RCA stent subacute thrombosis: Promus Premier DES 3.0 mm x 20 mm.  Marland Kitchen PERCUTANEOUS CORONARY STENT INTERVENTION (PCI-S)  02/25/2013   RCA CTO PCI with overlapping Promus DES: 3.0 mm x 38 mm, 3.5 mm x 70m  . TRANSTHORACIC ECHOCARDIOGRAM  12/2015   mild LVH, EF 55-60%, no RWMA, Gr 1 DD, mild MR  . TRANSTHORACIC ECHOCARDIOGRAM  09/2017   In setting of anterior STEMI:  EF 35% with mild concentric hypertrophy.  GRII DD.  Anteroseptal, anterior and anterolateral walls hypokinetic.  Moderate MR.  . TUBAL LIGATION  1994     OB History    Gravida  3   Para  3   Term  3   Preterm  0   AB  0   Living  3     SAB  0   TAB  0   Ectopic  0   Multiple  0   Live Births               Home Medications    Prior to Admission medications   Medication Sig Start  Date End Date Taking? Authorizing Provider  aspirin EC 81 MG tablet Take 1 tablet (81 mg total) by mouth daily. 10/14/17  Yes [place], ATami Lin PA  atorvastatin (LIPITOR) 40 MG tablet Take 1 tablet (40 mg total) by mouth daily. 10/22/17  Yes Bhagat, Bhavinkumar, PA  glipiZIDE (GLUCOTROL) 10 MG tablet Take 1 tablet (10 mg total) by mouth 2 (two) times daily before a meal. 11/30/17  Yes Newlin, Enobong, MD  hydrochlorothiazide (MICROZIDE) 12.5 MG capsule Take 1 capsule (12.5 mg total) by mouth daily. 10/22/17 01/20/18 Yes Bhagat, Bhavinkumar, PA  lisinopril (PRINIVIL,ZESTRIL) 10 MG tablet Take 1 tablet (10 mg total) by mouth daily. 10/14/17  Yes Duke, ATami Lin PA  metoprolol tartrate (LOPRESSOR) 50 MG tablet Take 50 mg by mouth 2 (two) times daily after a meal.   Yes [provider]  nitroGLYCERIN (NITROSTAT) 0.4 MG SL tablet Place 1 tablet (0.4 mg total) under the  tongue every 5 (five) minutes as needed for chest pain. Reported on 11/25/2015 10/14/17  Yes Duke, Tami Lin, PA  prasugrel (EFFIENT) 10 MG TABS tablet Take 1 tablet (10 mg total) by mouth daily. 10/15/17  Yes Duke, Tami Lin, PA  Blood Glucose Monitoring Suppl (TRUE METRIX METER) DEVI 1 kit by Does not apply route 4 (four) times daily. 10/24/17   Brayton Caves, PA-C  carvedilol (COREG) 12.5 MG tablet Take 1 tablet (12.5 mg total) by mouth 2 (two) times daily. Patient not taking: Reported on 01/06/2018 12/27/17 03/27/18  Leonie Man, MD  glucose blood (TRUE METRIX BLOOD GLUCOSE TEST) test strip Use as instructed 10/24/17   Brayton Caves, PA-C  TRUEPLUS LANCETS 28G MISC 28 g by Does not apply route 4 (four) times daily. 10/24/17   Brayton Caves, PA-C    Family History Family History  Problem Relation Age of Onset  . Diabetes Mother   . Diabetes Sister   . Diabetes Sister   . Diabetes Son        type 1  . Diabetes Son        type 2    Social History Social History   Tobacco Use  . Smoking status: Current  Every Day Smoker    Packs/day: 0.50    Years: 15.00    Pack years: 7.50    Types: Cigarettes  . Smokeless tobacco: Never Used  . Tobacco comment: states she has cut back to 5-6 cigarettes/day   Substance Use Topics  . Alcohol use: Yes    Alcohol/week: 0.0 oz    Comment: 02/25/2013 "only on special occasions; maybe once/yr"  . Drug use: Yes    Types: Marijuana    Comment: 02/25/2013 "quit marijuana ~ 2001"     Allergies   Patient has no known allergies.   Review of Systems Review of Systems  Constitutional: Negative.   HENT: Negative.   Respiratory: Positive for shortness of breath.   Cardiovascular: Positive for chest pain.  Gastrointestinal: Positive for nausea.  Musculoskeletal: Negative.   Skin: Negative.   Allergic/Immunologic: Positive for immunocompromised state.       Diabetic  Neurological: Negative.   Psychiatric/Behavioral: Negative.   All other systems reviewed and are negative.    Physical Exam Updated Vital Signs BP 108/88   Pulse (!) 51   Temp 98 F (36.7 C)   Resp 20   Ht _0  (1.753 m)   Wt 104.3 kg (230 lb)   LMP 10/20/2015   SpO2 100%   BMI 33.97 kg/m   Physical Exam  Constitutional: She appears well-developed and well-nourished.  HENT:  Head: Normocephalic and atraumatic.  Eyes: Pupils are equal, round, and reactive to light. Conjunctivae are normal.  Neck: Neck supple. No tracheal deviation present. No thyromegaly present.  Cardiovascular: Normal rate and regular rhythm.  No murmur heard. Pulmonary/Chest: Effort normal and breath sounds normal.  Abdominal: Soft. Bowel sounds are normal. She exhibits no distension. There is no tenderness.  Obese  Musculoskeletal: Normal range of motion. She exhibits no edema or tenderness.  Neurological: She is alert. Coordination normal.  Skin: Skin is warm and dry. No rash noted.  Psychiatric: She has a normal mood and affect.  Nursing note and vitals reviewed.    ED Treatments / Results    Labs (all labs ordered are listed, but only abnormal results are displayed) Labs Reviewed  CBC  BASIC METABOLIC PANEL  I-STAT TROPONIN, ED  I-STAT BETA HCG BLOOD,  ED ([facility], [facility], [facility] ONLY)   Results for orders placed or performed during the hospital encounter of 00/17/49  Basic metabolic panel  Result Value Ref Range   Sodium 131 (L) 135 - 145 mmol/L   Potassium 4.1 3.5 - 5.1 mmol/L   Chloride 99 (L) 101 - 111 mmol/L   CO2 22 22 - 32 mmol/L   Glucose, Bld 392 (H) 65 - 99 mg/dL   BUN 9 6 - 20 mg/dL   Creatinine, Ser 0.75 0.44 - 1.00 mg/dL   Calcium 8.9 8.9 - 10.3 mg/dL   GFR calc non Af Amer >60 >60 mL/min   GFR calc Af Amer >60 >60 mL/min   Anion gap 10 5 - 15  CBC  Result Value Ref Range   WBC 7.7 4.0 - 10.5 K/uL   RBC 4.96 3.87 - 5.11 MIL/uL   Hemoglobin 12.9 12.0 - 15.0 g/dL   HCT 40.8 36.0 - 46.0 %   MCV 82.3 78.0 - 100.0 fL   MCH 26.0 26.0 - 34.0 pg   MCHC 31.6 30.0 - 36.0 g/dL   RDW 13.9 11.5 - 15.5 %   Platelets 354 150 - 400 K/uL  D-dimer, quantitative (not at [facility])  Result Value Ref Range   D-Dimer, Quant <0.27 0.00 - 0.50 ug/mL-FEU  I-stat troponin, ED  Result Value Ref Range   Troponin i, poc 0.00 0.00 - 0.08 ng/mL   Comment 3          I-Stat beta hCG blood, ED  Result Value Ref Range   I-stat hCG, quantitative <5.0 <5 mIU/mL   Comment 3          I-Stat Troponin, ED (not at [facility])  Result Value Ref Range   Troponin i, poc 0.02 0.00 - 0.08 ng/mL   Comment 3           Dg Chest 2 View  Result Date: 01/06/2018 CLINICAL DATA:  Chest pain EXAM: CHEST - 2 VIEW COMPARISON:  October 10, 2017 FINDINGS: There is no edema or consolidation. The heart size and pulmonary vascularity are normal. No adenopathy. No bone lesions. There is a stent in the right coronary artery region. IMPRESSION: No edema or consolidation. Electronically Signed   By: Lowella Grip III M.D.   On: 01/06/2018 13:45   EKG EKG Interpretation  Date/Time:  Sunday January 06 2018 12:19:48  EDT Ventricular Rate:  82 PR Interval:  118 QRS Duration: 94 QT Interval:  432 QTC Calculation: 504 R Axis:   59 Text Interpretation:  Sinus rhythm with marked sinus arrhythmia Possible Left atrial enlargement Nonspecific T wave abnormality Prolonged QT Abnormal ECG No significant change since last tracing Confirmed by Orlie Dakin [phone]) on 01/06/2018 1:13:54 PM Biphasic T waves in septal leads seen on previous tracing have resolved  Radiology No results found.  Procedures Procedures (including critical care time)  Medications Ordered in ED Medications - No data to display   Initial Impression / Assessment and Plan / ED Course  I have reviewed the triage vital signs and the nursing notes.  Pertinent labs & imaging results that were available during my care of the patient were reviewed by me and considered in my medical decision making (see chart for details).     Intravenous nitroglycerin drip ordered by me I have consulted Dr Harrington Challenger from cardiology service who will come to evaluate patient in the ED.  I spoke with Dr. Harrington Challenger and appreciate her consult.  She is suggested patient have second  troponin and d-dimer, if negative patient okay for discharge. Patient symptoms are atypical for acute coronary syndrome.  Nitroglycerin drip was discontinued.  At 5:30 PM she is resting comfortably.  I counseled patient on smoking cessation for 5 minutes.  Plan she can [facility] at [facility].  Low pretest clinical probability for pulmonary embolism.  Negative d-dimer Lab work consistent with hyperglycemia Of advised patient is to elevated blood sugar.  She will follow-up with PMD.  She reports that she was prescribed farxiga, however was told by her PMD not to start taking it until [facility] by cardiologist. Final Clinical Impressions(s) / ED Diagnoses  dx #1 atypical chest pain #2 hyperglycemia #3 tobacco abuse Final diagnoses:  None    ED Discharge Orders     None       Orlie Dakin, MD 01/06/18 [place], [place], MD 01/06/18 [id number]

## 2018-01-06 NOTE — ED Notes (Signed)
Pt ambulated to restroom with steady gait.

## 2018-01-06 NOTE — ED Notes (Signed)
Attempted to gain IV access x2 without success.

## 2018-01-06 NOTE — Discharge Instructions (Addendum)
Take Tylenol as needed for pain.  Call  your primary care physician this week.  Ask your primary care physician to help you to stop smoking.  Your blood sugar was elevated today at 392.  Take your medications as prescribed.  Return if concern for any reason

## 2018-01-06 NOTE — ED Notes (Signed)
Pt stable, ambulatory, and verbalizes understanding of d/c instructions.  

## 2018-01-06 NOTE — ED Triage Notes (Signed)
Pt reports new onset chest pain to central chest pain radiating into back and left arm that started this morning while at rest. Hx of 4 stents. Pt took 1 Asprin. Pain currently 7/10.

## 2018-01-06 NOTE — ED Notes (Signed)
Patient transported to X-ray 

## 2018-01-07 ENCOUNTER — Ambulatory Visit: Payer: Self-pay | Attending: Family Medicine

## 2018-01-23 NOTE — Progress Notes (Signed)
Patient ID: ERA PARR                 DOB: 1970-08-23                      MRN: [id number]     HPI: Amy Chaney is a 47 y.o. female referred by Dr. Ellyn Hack to HTN clinic.  PMH includes hypertension, hyperlipidemia, CAD s/p STEMI, hypertension, DM-II, and tobacco abuse. Metoprolol succinate was replaced by carvedilol 12.27m by DR HEllyn Hackon 12/27/17 to improved BP control. Patient presents today for HTN clinic evaluation and medication management. She denies dizziness, swelling, headaches or chest pain. During medication reconciliation it was noticed patient never picked-up prescription for carvedilol and still taking metoprolol succinate instead.    Current HTN meds:  HCTZ 12.559mdaily Lisinopril 1022maily Metoprolol succinate 48m63mily  BP goal: 130/80  Family History: diabetes in sister; all siblings with HTN and mother diabetes  Social History: current smoker (1 pack in 3 days), denies alcohol use, denies any illicit drug use  Diet: [place] soda (~3 per day); coffee once per week  Exercise: occasional but not consistent physical activity or exercise regimen.   Home BP readings: none availabel  Wt Readings from Last 3 Encounters:  01/06/18 230 lb (104.3 kg)  12/27/17 229 lb 12.8 oz (104.2 kg)  11/30/17 226 lb 3.2 oz (102.6 kg)   BP Readings from Last 3 Encounters:  01/24/18 (!) 150/82  01/06/18 124/72  12/27/17 (!) 184/95   Pulse Readings from Last 3 Encounters:  01/24/18 76  01/06/18 62  12/27/17 91    Past Medical History:  Diagnosis Date  . Coronary artery disease involving native coronary artery of native heart with angina pectoris (HCC)[place]/06/2015   a. s/p PCI of the mid RCA 01/2013 //  b. LHC 9/14: EF 55%, RCA stent 100% occluded, LAD irregularities >>  subacute stent thrombosis, Promus DES PCI distal overlap // c) 09/2017 - Ant STEMI - LAD-D2 PCI - Successful PCI of LAD (3 overlapping DES), unable to restore flow down D2l that was stented as well.   Likely related to downstream dissection, but unable to rewire.  . Daily headache   . Depression   . Dyslipidemia, goal LDL below 70   . History of Doppler ultrasound    carotid bruit >> a. Carotid US [id number]: bilat ICA 1-39%  . Hypertension   . Prolonged Q-T interval on ECG 01/11/2016  . STEMI involving left anterior descending coronary artery (HCC)[place]/2019   Severe [place] 1, 1, 1 LAD-D2 lesion (complicated by post PTCA dissection/intramural hematoma) -successful extensive PCI of the LAD but unable to maintain patency of the stented D2.   . STMarland KitchenMI involving oth coronary artery of inferior wall (HCC)[place]14  . Tobacco abuse 01/11/2016  . Type II diabetes mellitus (HCC)[place]2012   sister and son also diabetic     Current Outpatient Medications on File Prior to Visit  Medication Sig Dispense Refill  . aspirin EC 81 MG tablet Take 1 tablet (81 mg total) by mouth daily. 90 tablet 3  . atorvastatin (LIPITOR) 40 MG tablet Take 1 tablet (40 mg total) by mouth daily. 30 tablet 3  . Blood Glucose Monitoring Suppl (TRUE METRIX METER) DEVI 1 kit by Does not apply route 4 (four) times daily. 1 Device 0  . glipiZIDE (GLUCOTROL) 10 MG tablet Take 1 tablet (10 mg total) by mouth 2 (two) times daily before a meal. 60  tablet 2  . glucose blood (TRUE METRIX BLOOD GLUCOSE TEST) test strip Use as instructed 100 each 12  . hydrochlorothiazide (MICROZIDE) 12.5 MG capsule Take 1 capsule (12.5 mg total) by mouth daily. 30 capsule 3  . lisinopril (PRINIVIL,ZESTRIL) 10 MG tablet Take 1 tablet (10 mg total) by mouth daily. 90 tablet 3  . naproxen sodium (ALEVE) 220 MG tablet Take 220 mg by mouth 2 (two) times daily as needed.    . nitroGLYCERIN (NITROSTAT) 0.4 MG SL tablet Place 1 tablet (0.4 mg total) under the tongue every 5 (five) minutes as needed for chest pain. Reported on 11/25/2015 25 tablet 3  . prasugrel (EFFIENT) 10 MG TABS tablet Take 1 tablet (10 mg total) by mouth daily. 90 tablet 3  . TRUEPLUS LANCETS 28G MISC  28 g by Does not apply route 4 (four) times daily. 120 each 2   No current facility-administered medications on file prior to visit.     No Known Allergies  Blood pressure (!) 150/82, pulse 76, height 5' 9"  (1.753 m), last menstrual period 10/20/2015, SpO2 99 %.  Smoker Patient not ready to quit yet. We discussed strategies to decrease amount of cigarettes per day, but no additional intervention done today.  Essential hypertension Blood pressure remains elevated above goal of 130/80. Patient still struggling with lifestyle modifications and never initiated carvedilol as prescribed by cardiologist. Long time was spent taking about lifestyle modification and advantages to change beta-blocker. Will discontinue metoprolol, start carvedilol 12.64m twice daily, work on,ife style modifications and follow up with HTN clinic in 3 weeks. Plan to change HCTZ 12.577mto chlorthalidone during next office visit if additional BP control needed.   Amy Chaney PharmD, BCPS, CPLafayette[place]780998/28/2019 8:22 AM

## 2018-01-24 ENCOUNTER — Ambulatory Visit (INDEPENDENT_AMBULATORY_CARE_PROVIDER_SITE_OTHER): Payer: Self-pay | Admitting: Pharmacist

## 2018-01-24 VITALS — BP 150/82 | HR 76 | Ht 69.0 in

## 2018-01-24 DIAGNOSIS — I10 Essential (primary) hypertension: Secondary | ICD-10-CM

## 2018-01-24 DIAGNOSIS — F17200 Nicotine dependence, unspecified, uncomplicated: Secondary | ICD-10-CM

## 2018-01-24 MED ORDER — CARVEDILOL 12.5 MG PO TABS: 12.5000 mg | ORAL_TABLET | Freq: Two times a day (BID) | ORAL | 2 refills | Status: DC

## 2018-01-24 MED FILL — CARVEDILOL 12.5 MG TABLET: 12.5 | 30 days supply | Qty: 60 | Fill #0

## 2018-01-24 NOTE — Patient Instructions (Addendum)
Return for a a follow up appointment in 3 weeks  Check your blood pressure at home daily (if able) and keep record of the readings.  Take your BP meds as follows: *STOP taking metoprolol* *START taking carvedilol 12.5mg  twice daily*  Bring all of your meds, your BP cuff and your record of home blood pressures to your next appointment.  Exercise as you're able, try to walk approximately 30 minutes per day.  Keep salt intake to a minimum, especially watch canned and prepared boxed foods.  Eat more fresh fruits and vegetables and fewer canned items.  Avoid eating in fast food restaurants.    HOW TO TAKE YOUR BLOOD PRESSURE: . Rest 5 minutes before taking your blood pressure. .  Don't smoke or drink caffeinated beverages for at least 30 minutes before. . Take your blood pressure before (not after) you eat. . Sit comfortably with your back supported and both feet on the floor (don't cross your legs). . Elevate your arm to heart level on a table or a desk. . Use the proper sized cuff. It should fit smoothly and snugly around your bare upper arm. There should be enough room to slip a fingertip under the cuff. The bottom edge of the cuff should be 1 inch above the crease of the elbow. . Ideally, take 3 measurements at one sitting and record the average.

## 2018-01-25 ENCOUNTER — Encounter: Payer: Self-pay | Admitting: Pharmacist

## 2018-01-25 NOTE — Assessment & Plan Note (Signed)
Blood pressure remains elevated above goal of 130/80. Patient still struggling with lifestyle modifications and never initiated carvedilol as prescribed by cardiologist. Long time was spent taking about lifestyle modification and advantages to change beta-blocker. Will discontinue metoprolol, start carvedilol 12.5mg  twice daily, work on,ife style modifications and follow up with HTN clinic in 3 weeks. Plan to change HCTZ 12.5mg  to chlorthalidone during next office visit if additional BP control needed.

## 2018-01-25 NOTE — Assessment & Plan Note (Signed)
Patient not ready to quit yet. We discussed strategies to decrease amount of cigarettes per day, but no additional intervention done today.

## 2018-02-12 ENCOUNTER — Ambulatory Visit: Payer: Self-pay

## 2018-03-04 ENCOUNTER — Ambulatory Visit: Payer: Self-pay | Admitting: Family Medicine

## 2018-04-02 ENCOUNTER — Telehealth: Payer: Self-pay | Admitting: Licensed Clinical Social Worker

## 2018-04-02 ENCOUNTER — Ambulatory Visit (INDEPENDENT_AMBULATORY_CARE_PROVIDER_SITE_OTHER): Payer: Self-pay | Admitting: Cardiology

## 2018-04-02 VITALS — BP 152/94 | HR 62 | Ht 69.0 in | Wt 231.8 lb

## 2018-04-02 DIAGNOSIS — Z91199 Patient's noncompliance with other medical treatment and regimen due to unspecified reason: Secondary | ICD-10-CM

## 2018-04-02 DIAGNOSIS — R195 Other fecal abnormalities: Secondary | ICD-10-CM

## 2018-04-02 DIAGNOSIS — E785 Hyperlipidemia, unspecified: Secondary | ICD-10-CM

## 2018-04-02 DIAGNOSIS — Z955 Presence of coronary angioplasty implant and graft: Secondary | ICD-10-CM

## 2018-04-02 DIAGNOSIS — I5030 Unspecified diastolic (congestive) heart failure: Secondary | ICD-10-CM

## 2018-04-02 DIAGNOSIS — I110 Hypertensive heart disease with heart failure: Secondary | ICD-10-CM

## 2018-04-02 DIAGNOSIS — E1169 Type 2 diabetes mellitus with other specified complication: Secondary | ICD-10-CM

## 2018-04-02 DIAGNOSIS — Z716 Tobacco abuse counseling: Secondary | ICD-10-CM

## 2018-04-02 DIAGNOSIS — I25119 Atherosclerotic heart disease of native coronary artery with unspecified angina pectoris: Secondary | ICD-10-CM

## 2018-04-02 DIAGNOSIS — F17200 Nicotine dependence, unspecified, uncomplicated: Secondary | ICD-10-CM

## 2018-04-02 DIAGNOSIS — Z9119 Patient's noncompliance with other medical treatment and regimen: Secondary | ICD-10-CM

## 2018-04-02 DIAGNOSIS — I10 Essential (primary) hypertension: Secondary | ICD-10-CM

## 2018-04-02 DIAGNOSIS — I5042 Chronic combined systolic (congestive) and diastolic (congestive) heart failure: Secondary | ICD-10-CM

## 2018-04-02 DIAGNOSIS — E7800 Pure hypercholesterolemia, unspecified: Secondary | ICD-10-CM

## 2018-04-02 DIAGNOSIS — E1165 Type 2 diabetes mellitus with hyperglycemia: Secondary | ICD-10-CM

## 2018-04-02 MED ORDER — ATORVASTATIN CALCIUM 40 MG PO TABS: 40.0000 mg | ORAL_TABLET | Freq: Every day | ORAL | 6 refills | Status: DC

## 2018-04-02 MED ORDER — GLIPIZIDE 10 MG PO TABS: 10.0000 mg | ORAL_TABLET | Freq: Two times a day (BID) | ORAL | 6 refills | Status: DC

## 2018-04-02 MED ORDER — CARVEDILOL 12.5 MG PO TABS: 12.5000 mg | ORAL_TABLET | Freq: Two times a day (BID) | ORAL | 6 refills | Status: DC

## 2018-04-02 MED ORDER — CHLORTHALIDONE 25 MG PO TABS: 25.0000 mg | ORAL_TABLET | Freq: Every day | ORAL | 6 refills | Status: DC

## 2018-04-02 NOTE — Patient Instructions (Addendum)
MEDICATION INSTRUCTIONS  STOP  HYDROCHLOROTHIAZIDE   START CHLORTHALIDONE 25 MG ONE TABLET DAILY   RESTART MEDICATIONS ATORVASTATIN, GLIPIZIDE ,   ALSO RESTART LISINOPRIL AT 20 MG DAILY   You have been referred to [place] BE DONE IN 3 MONTHS , WILL MAIL LABSLIP AT THAT TIME.--LIPIDS, CMP , CBC- IF YOU ARE TAKING MEDICATIONS.  Your physician wants you to follow-up in FEB 2020 [place]. You will receive a reminder letter in the mail two months in advance. If you don't receive a letter, please call our office to schedule the follow-up appointment.    If you need a refill on your cardiac medications before your next appointment, please call your pharmacy.

## 2018-04-02 NOTE — Telephone Encounter (Signed)
CSW referred to assist with medication assistance. Patient reports she is unsure how much or how many prescriptions she needs assistance with at this time. She states she uses Music therapist and often her co pays vary. She has a pending application for the [employer] with Development worker, community at [facility] which would include assistance with medications. Patient will follow up with her application and the financial counselors and return call to CSW if needed. CSW available if needed. [name], [place], [place]

## 2018-04-02 NOTE — Progress Notes (Signed)
PCP: [name], MD  Clinic Note: Chief Complaint  Patient presents with  . Shortness of Breath    pt states some SOB with activity    HPI: Amy Chaney is a 47 y.o. female with a PMH below who presents today for ~2 month f/u for CAD-STEMI,  CRFs include: HTN, HLD, DM and tobacco abuse  CAD History:  CTO PCI to the RCA in July 2014 (Dr. Einar Gip) complicated by subacute stent thrombosis after stopping her Effient for 4 days requiring additional overlapping stent placed in the distal segment-->   she then was temporarily seen by Dr. Fransico Him and Richardson Dopp, [place] in 2017, but lost to follow-up.  Anterior STEMI March 2019 -subtle presentation --severe stenosis at LAD-D2 bifurcation. ->  Bifurcation PCI complicated by D2 dissection and LAD intramural hematoma requiring excessive LAD stent.  Unable to rewire D2 complete revascularization and -unable to restore flow  Her course has been complicated by inability to afford medications leading to medical nonadherence. -->    [name] was last seen on Dec 27, 2017.  At that point she was essentially not taking any other medications.  Had been off most of her medications for about 2 weeks at that point.  The only medicine she was taking was metoprolol.  Was not taking Effient because she "was not able to fill the prescription because she could not afford it."  We clarify that she could get the Effient from the [facility].  There is an class I to diastolic dysfunction with hypertensive heart disease.  Restarted lisinopril convert to carvedilol from Toprol.  --She was then seen in CV [facility] by Racquel Rodriguez-Guzman. [place] on January 24, 2018--> she had not switched over to carvedilol, she was converted at that time of 12.5 mg carvedilol and changed from HCTZ to chlorthalidone.  Was still not ready to quit smoking.  Recent Hospitalizations:   January 06, 2018 ER visit for atypical chest pain; she admitted to having missed  roughly 2 weeks of Effient.  Studies Personally Reviewed - (if available, images/films reviewed: From Epic Chart or Care Everywhere)  None  Interval History: [name] again returns today out of her medications.  The only medicineS that she is still taking are carvedilol and Effient both of these are under "last bottle.  She has not been on either HCTZ/chlorthalidone or lisinopril for the last 2 weeks.  She also has not been taking her statin.  Nor does she get her lipids checked like she was supposed to.  She says she is having issues getting her medications including troubles with the health wellness center.  Apparently she needs to have a copy of her 1040 form and has been having difficulty getting this from the tax office.  Thankfully, [name] has not had any recurrent severe anginal symptoms suggestive of stent thrombosis.  She definitely gets short of breath with significant activity may be walking up to 6 blocks.  She has not noted any simply exertional chest pain unless she is actually picking something up.  If she pick something up over 30 pounds she she may have some discomfort in her chest but it goes away when she puts the object down. She really does not exercise very much because she has bilateral leg and back pain.  She does note that her leg pain can get worse with going up and down stairs. She does not really note any PND orthopnea with minimal edema.  She says  she sleeps slightly elevated but more for comfort.  She is down to 1 cigarette a day that she gets from her son because she cannot afford them.  She may feel lightheaded and dizzy on occasion, especially when she is missed her medications but no syncope or near syncope --she has not had the episodes of blurry vision and dizziness that she had when her blood pressures were more dramatically elevated..  No TIA or amaurosis fugax.  She has had some dark stools but no hematochezia or hematemesis, epistaxis..  She may feel some  occasional palpitations but no rapid irregular heartbeats.  ROS: A comprehensive was performed.  Pertinent symptoms noted above Review of Systems  Constitutional: Positive for malaise/fatigue.  HENT: Negative for congestion and nosebleeds.   Respiratory: Positive for shortness of breath (Off and on with exertion).   Cardiovascular: Negative for claudication.  Gastrointestinal: Positive for nausea. Negative for abdominal pain, blood in stool and melena.  Musculoskeletal: Negative for joint pain.  Neurological: Positive for dizziness (Per HPI).  Endo/Heme/Allergies: Negative for environmental allergies.  Psychiatric/Behavioral: Negative.   All other systems reviewed and are negative.  I have reviewed and (if needed) personally updated the patient's problem list, medications, allergies, past medical and surgical history, social and family history.   Past Medical History:  Diagnosis Date  . Coronary artery disease involving native coronary artery of native heart with angina pectoris ([place]) 12/09/2014   a. s/p PCI of the mid RCA 01/2013 //  b. LHC 9/14: EF 55%, RCA stent 100% occluded, LAD irregularities >>  subacute stent thrombosis, Promus DES PCI distal overlap // c) 09/2017 - Ant STEMI - LAD-D2 PCI - Successful PCI of LAD (3 overlapping DES), unable to restore flow down D2l that was stented as well.  Likely related to downstream dissection, but unable to rewire.  . Daily headache   . Depression   . Dyslipidemia, goal LDL below 70   . History of Doppler ultrasound    carotid bruit >> a. Carotid US 6/17: bilat ICA 1-39%  . Hypertension   . Prolonged Q-T interval on ECG 01/11/2016  . STEMI involving left anterior descending coronary artery ([place]) 09/2017   Severe [place] 1, 1, 1 LAD-D2 lesion (complicated by post PTCA dissection/intramural hematoma) -successful extensive PCI of the LAD but unable to maintain patency of the stented D2.   Marland Kitchen STEMI involving oth coronary artery of inferior wall ([place])  2014   Secondary to subacute stent thrombosis of RCA stent segment having missed 4 doses of Effient.  . Tobacco abuse 01/11/2016  . Type II diabetes mellitus ([place]) 12/2010   sister and son also diabetic     Past Surgical History:  Procedure Laterality Date  . CHOLECYSTECTOMY  ~ 2000  . CORONARY STENT INTERVENTION N/A 10/10/2017   Procedure: CORONARY STENT INTERVENTION;  Surgeon: Leonie Man, MD;  Location: [place] [facility];  Service: Cardiovascular; LAD PCI: STENT SYNERGY DES 3.5X32 (p),STENT SYNERGY DES 3.5X 8 (m), STENT SYNERGY DES 3.5X28 (d).  D2 PCI: STENT SYNERGY DES 2.5X24.  (UNABL TO REWIRE & EXPAND - TO OF DIAG AT END OF CASE)  . LEFT HEART CATH AND CORONARY ANGIOGRAPHY N/A 10/10/2017   Procedure: LEFT HEART CATH AND CORONARY ANGIOGRAPHY;  Surgeon: Leonie Man, MD;  Location: [facility];  Service: Cardiovascular;  Laterality: N/A; -patent RCA stents with mild in-stent restenosis. [place] 1,1,1 mLD-D2 10% (complicated by dissection/intramural thrombus)  . LEFT HEART CATHETERIZATION WITH CORONARY ANGIOGRAM N/A 02/25/2013  Procedure: LEFT HEART CATHETERIZATION WITH CORONARY ANGIOGRAM;  Surgeon: Laverda Page, MD;  Location: [facility] CATH LAB;  Service: Cardiovascular;; CTO m RCA; otherwise normal coronaries  . LEFT HEART CATHETERIZATION WITH CORONARY ANGIOGRAM N/A 04/01/2013   Procedure: LEFT HEART CATHETERIZATION WITH CORONARY ANGIOGRAM;  Surgeon: Laverda Page, MD;  Location: [facility] CATH LAB;  Service: Cardiovascular: 100% occlusion of distal RCA stent (subacute stent thrombosis -  secondary to stopping Effient).  Overlapping DES PCI  . NM MYOVIEW LTD  12/2015    EF 38%.  Hypertensive response to exercise (231/132 mmHg).  INTERMEDIATE RISK due to -diffuse hypokinesis and reduced EF.  No ischemia or infarction.  Marland Kitchen PERCUTANEOUS CORONARY STENT INTERVENTION (PCI-S)  04/01/2013   Procedure: PERCUTANEOUS CORONARY STENT INTERVENTION (PCI-S);  Surgeon: Laverda Page, MD;  Location:  [facility] CATH LAB;  Service: Cardiovascular;; PCI of distal RCA stent subacute thrombosis: Promus Premier DES 3.0 mm x 20 mm.  Marland Kitchen PERCUTANEOUS CORONARY STENT INTERVENTION (PCI-S)  02/25/2013   Procedure: PERCUTANEOUS CORONARY STENT INTERVENTION (PCI-S);  Surgeon: Laverda Page, MD;  Location: [facility] CATH LAB;  RCA CTO PCI with overlapping Promus DES: 3.0 mm x 38 mm, 3.5 mm x 53m  . TRANSTHORACIC ECHOCARDIOGRAM  12/2015   mild LVH, EF 55-60%, no RWMA, Gr 1 DD, mild MR  . TRANSTHORACIC ECHOCARDIOGRAM  09/2017   In setting of anterior STEMI:  EF 35% with mild concentric hypertrophy.  GRII DD.  Anteroseptal, anterior and anterolateral walls hypokinetic.  Moderate MR.  . TUBAL LIGATION  1994    Transthoracic echo: EF 35% with mild concentric hypertrophy.  GRII DD.  Anteroseptal, anterior and anterolateral walls hypokinetic.  Moderate MR.  Cardiac cath-PCI on October 10, 2017: Successful PCI of LAD, but was unable to restore flow down the diagonal that was stented as well.  Likely related to downstream dissection, but unable to rewire.  LAD PCI: STENT SYNERGY DES 3.5X32 (p),STENT SYNERGY DES 3.5X 8 (m), STENT SYNERGY DES 3.5X28 (d)  D2 PCI: STENT SYNERGY DES 2.5X24.  (UNABL TO REWIRE & EXPAND - TO OF DIAG AT END OF CASE)  Diagnostic Diagram      Post-Intervention Diagram        Current Meds  Medication Sig  . aspirin EC 81 MG tablet Take 1 tablet (81 mg total) by mouth daily.  .Marland Kitchenatorvastatin (LIPITOR) 40 MG tablet Take 1 tablet (40 mg total) by mouth daily.  . Blood Glucose Monitoring Suppl (TRUE METRIX METER) DEVI 1 kit by Does not apply route 4 (four) times daily.  . carvedilol (COREG) 12.5 MG tablet Take 1 tablet (12.5 mg total) by mouth 2 (two) times daily. To replace metoprolol  . glipiZIDE (GLUCOTROL) 10 MG tablet Take 1 tablet (10 mg total) by mouth 2 (two) times daily before a meal.  . glucose blood (TRUE METRIX BLOOD GLUCOSE TEST) test strip Use as instructed  . lisinopril  (PRINIVIL,ZESTRIL) 10 MG tablet Take 1 tablet (10 mg total) by mouth daily.  . naproxen sodium (ALEVE) 220 MG tablet Take 220 mg by mouth 2 (two) times daily as needed.  . nitroGLYCERIN (NITROSTAT) 0.4 MG SL tablet Place 1 tablet (0.4 mg total) under the tongue every 5 (five) minutes as needed for chest pain. Reported on 11/25/2015  . prasugrel (EFFIENT) 10 MG TABS tablet Take 1 tablet (10 mg total) by mouth daily.  . TRUEPLUS LANCETS 28G MISC 28 g by Does not apply route 4 (four) times daily.  . [DISCONTINUED] atorvastatin (LIPITOR) 40 MG  tablet Take 1 tablet (40 mg total) by mouth daily.  . [DISCONTINUED] carvedilol (COREG) 12.5 MG tablet Take 1 tablet (12.5 mg total) by mouth 2 (two) times daily. To replace metoprolol  . [DISCONTINUED] glipiZIDE (GLUCOTROL) 10 MG tablet Take 1 tablet (10 mg total) by mouth 2 (two) times daily before a meal.  . [DISCONTINUED] hydrochlorothiazide (MICROZIDE) 12.5 MG capsule Take 12.5 mg by mouth daily.    No Known Allergies  Social History   Tobacco Use  . Smoking status: Current Every Day Smoker    Packs/day: 0.50    Years: 15.00    Pack years: 7.50    Types: Cigarettes  . Smokeless tobacco: Never Used  . Tobacco comment: states she has cut back to 1-3 cigarettes/day   Substance Use Topics  . Alcohol use: Yes    Alcohol/week: 0.0 standard drinks    Comment: 02/25/2013 "only on special occasions; maybe once/yr"  . Drug use: Yes    Types: Marijuana    Comment: 02/25/2013 "quit marijuana ~ 2001"   Social History   Social History Narrative  . Not on file    family history includes Diabetes in her mother, sister, sister, son, and son.  Wt Readings from Last 3 Encounters:  04/02/18 231 lb 12.8 oz (105.1 kg)  01/06/18 230 lb (104.3 kg)  12/27/17 229 lb 12.8 oz (104.2 kg)    PHYSICAL EXAM BP (!) 152/94   Pulse 62   Ht 5' 9"  (1.753 m)   Wt 231 lb 12.8 oz (105.1 kg)   LMP 10/20/2015   SpO2 100%   BMI 34.23 kg/m  Physical Exam    Constitutional: She is oriented to person, place, and time. She appears well-developed and well-nourished.  Mildly obese.  Well-groomed.  HENT:  Head: Normocephalic and atraumatic.  Neck: Normal range of motion. Neck supple. JVD (Roughly 8-9 cm water.) present. No hepatojugular reflux present. Carotid bruit is not present.  Cardiovascular: Normal rate, regular rhythm, S1 normal, S2 normal, intact distal pulses and normal pulses.  Occasional extrasystoles are present. PMI is not displaced (Unable to palpate). Exam reveals gallop and S4.  Murmur heard.  Low-pitched blowing holosystolic murmur is present with a grade of 1/6 at the apex. Pulmonary/Chest: Effort normal and breath sounds normal. No respiratory distress. She has no wheezes. She has no rales.  Abdominal: Soft. Bowel sounds are normal. She exhibits no distension. There is no tenderness. There is no rebound.  No HSM.  Musculoskeletal: Normal range of motion. She exhibits edema (Trivial bilateral pedal).  Neurological: She is alert and oriented to person, place, and time.  Psychiatric: She has a normal mood and affect. Her behavior is normal. Thought content normal.  Normal mood and affect, but seems to be somewhat nave despite her prior history as to the importance of taking her medications.  She would like to go back to work, but does not have the possibility of medication that she has her medications.  Vitals reviewed.    Adult ECG Report Not checked  Other studies Reviewed: Additional studies/ records that were reviewed today include:  Recent Labs: Was supposed to have had labs checked before this visit. Lab Results  Component Value Date   CREATININE 0.75 01/06/2018   BUN 9 01/06/2018   NA 131 (L) 01/06/2018   K 4.1 01/06/2018   CL 99 (L) 01/06/2018   CO2 22 01/06/2018   Lab Results  Component Value Date   CHOL 165 10/10/2017   HDL 52 10/10/2017  LDLCALC 106 (H) 10/10/2017   TRIG 35 10/10/2017   CHOLHDL 3.2  10/10/2017   Lab Results  Component Value Date   HGBA1C 8.6 (H) 10/10/2017    ASSESSMENT / PLAN: Problem List Items Addressed This Visit    Chronic combined systolic (congestive) and diastolic (congestive) heart failure (HCC) - Primary (Chronic)    Relatively euvolemic on exam.  As such we have not had to use any loop diuretic.  I think that she should get some benefit from chlorthalidone.      Relevant Medications   carvedilol (COREG) 12.5 MG tablet   chlorthalidone (HYGROTON) 25 MG tablet   atorvastatin (LIPITOR) 40 MG tablet   Other Relevant Orders   Comprehensive metabolic panel   CBC   Coronary artery disease involving native coronary artery of native heart with angina pectoris ([place]) (Chronic)    She has extensive stents in both the LAD and RCA with an occluded diagonal branch due to unexpected dissection during initial intervention.  Unfortunately she does not fully understand the importance of being on her medications.  She simply needs to show a copy of her 1040, but she says she did her taxes online last year does have a copy printed out.  She is trying to get a copy from the tax office and take it to the wellness center to get her medicines.  We will do over the weekend to make sure that she is able to get at a minimum her beta-blocker and Effient, but what I would like to get her back on her statin and ACE inhibitor as well.  Thankfully, she does not seem to be having to anginal symptoms just with some exertional dyspnea which could be more related to her hypertension deconditioning.  The chest pain she has when lifting objects is probably more related to musculoskeletal pain based on the location and not consistent with angina.      Relevant Medications   carvedilol (COREG) 12.5 MG tablet   chlorthalidone (HYGROTON) 25 MG tablet   atorvastatin (LIPITOR) 40 MG tablet   Other Relevant Orders   Comprehensive metabolic panel   CBC   Dark stools   Relevant Orders   CBC    Diabetes type 2, uncontrolled (HCC) (Chronic)   Relevant Medications   atorvastatin (LIPITOR) 40 MG tablet   glipiZIDE (GLUCOTROL) 10 MG tablet   Other Relevant Orders   Comprehensive metabolic panel   Essential hypertension (Chronic)   Relevant Medications   carvedilol (COREG) 12.5 MG tablet   chlorthalidone (HYGROTON) 25 MG tablet   atorvastatin (LIPITOR) 40 MG tablet   H/O noncompliance with medical treatment, presenting hazards to health (Chronic)    I again stressed to her the importance of taking her medications at this year minimum her antiplatelet agent.  She needs to contact the office if she is anywhere near running out of medicines.  I stressed to her the importance of keeping the stents open and reiterated that she is already had one heart attack as result of not taking her medications.  We have referred her to [place]      Hyperlipidemia associated with type 2 diabetes mellitus ([place]) (Chronic)    Target LDL is really 50 but less than 70 and most recent check was 106.  Unfortunately she is now on her statins I do not really think there is any benefit from checking labs now.  We will recheck in about 2 months after restarting her on atorvastatin.  Relevant Medications   carvedilol (COREG) 12.5 MG tablet   chlorthalidone (HYGROTON) 25 MG tablet   atorvastatin (LIPITOR) 40 MG tablet   glipiZIDE (GLUCOTROL) 10 MG tablet   Other Relevant Orders   Lipid panel   Hypertension, accelerated, with diastolic congestive heart failure, NYHA class 1 (HCC) (Chronic)    At least class II diastolic dysfunction symptom and even some chest tightness.  As per previous instructions, should we can stop HCTZ and start chlorthalidone.  I am also can have her restart the lisinopril and increase to 20 mg daily.  With increasing ACE inhibitor and adding chlorthalidone, will check chemistry panel along with lipids.      Relevant Medications   carvedilol  (COREG) 12.5 MG tablet   chlorthalidone (HYGROTON) 25 MG tablet   atorvastatin (LIPITOR) 40 MG tablet   Other Relevant Orders   Comprehensive metabolic panel   CBC   Morbid obesity ([place]): BMI ~36 with HTN, HLD, CAD (Chronic)    Clearly needs to lose weight.  Unfortunately she does not seem to be all that active with any kind of exercise.  I stressed the importance of staying active and trying to lose weight with diet and exercise.      Relevant Medications   glipiZIDE (GLUCOTROL) 10 MG tablet   S/P primary angioplasty with coronary stent (Chronic)    Multiple DES stents in LAD and RCA.  It is imperative that she stays on her antiplatelet agent.  I stressed again and again the importance of making sure that if nothing else she does not miss these medications.  Essentially would recommend lifelong antiplatelet agent.  She has noted some dark stools which may be nothing, we will check a CBC when she has her chemistry and lipids checked.      Smoker (Chronic)    She is cutting down, not yet ready to quit.  Smoking cessation instruction/counseling given:  counseled patient on the dangers of tobacco use, advised patient to stop smoking, and reviewed strategies to maximize success      Tobacco abuse counseling    Other Visit Diagnoses    Hypercholesterolemia  (Chronic)      Relevant Medications   carvedilol (COREG) 12.5 MG tablet   chlorthalidone (HYGROTON) 25 MG tablet   atorvastatin (LIPITOR) 40 MG tablet   Other Relevant Orders   Lipid panel      I spent well over 40 minutes with the patient explaining the importance of medications yet again and trying to look into ways to help her get her medications. >> 50% of the time was spent in direct patient counseling.  I instructed her that most of these medicines other than the Effient are generic and she should be to get them at the [facility] or [facility] pharmacies for the low cost rate.  Current medicines are reviewed at length with the  patient today.  (+/- concerns) discussed inability to pay for medications The following changes have been made:  See below  Patient Instructions  MEDICATION INSTRUCTIONS  STOP  HYDROCHLOROTHIAZIDE   START CHLORTHALIDONE 25 MG ONE TABLET DAILY   RESTART MEDICATIONS ATORVASTATIN, GLIPIZIDE ,   ALSO RESTART LISINOPRIL AT 20 MG DAILY   You have been referred to [place] 3 MONTHS , WILL MAIL LABSLIP AT THAT TIME.--LIPIDS, CMP , CBC- IF YOU ARE TAKING MEDICATIONS.  Your physician wants you to follow-up in FEB 2020 [place]. You will receive a  reminder letter in the mail two months in advance. If you don't receive a letter, please call our office to schedule the follow-up appointment.    If you need a refill on your cardiac medications before your next appointment, please call your pharmacy.    Studies Ordered:   Orders Placed This Encounter  Procedures  . Lipid panel  . Comprehensive metabolic panel  . CBC      Glenetta Hew, M.D., M.S. Interventional Cardiologist   Pager # [phone] Phone # [phone] [address] [place], [place] [address]   Thank you for choosing [facility] at [facility]!!

## 2018-04-03 ENCOUNTER — Encounter: Payer: Self-pay | Admitting: Cardiology

## 2018-04-03 DIAGNOSIS — R195 Other fecal abnormalities: Secondary | ICD-10-CM | POA: Insufficient documentation

## 2018-04-03 NOTE — Assessment & Plan Note (Signed)
Relatively euvolemic on exam.  As such we have not had to use any loop diuretic.  I think that she should get some benefit from chlorthalidone.

## 2018-04-03 NOTE — Assessment & Plan Note (Signed)
Target LDL is really 50 but less than 70 and most recent check was 106.  Unfortunately she is now on her statins I do not really think there is any benefit from checking labs now.  We will recheck in about 2 months after restarting her on atorvastatin.

## 2018-04-03 NOTE — Assessment & Plan Note (Addendum)
She has extensive stents in both the LAD and RCA with an occluded diagonal branch due to unexpected dissection during initial intervention.  Unfortunately she does not fully understand the importance of being on her medications.  She simply needs to show a copy of her 1040, but she says she did her taxes online last year does have a copy printed out.  She is trying to get a copy from the tax office and take it to the wellness center to get her medicines.  We will do over the weekend to make sure that she is able to get at a minimum her beta-blocker and Effient, but what I would like to get her back on her statin and ACE inhibitor as well.  Thankfully, she does not seem to be having to anginal symptoms just with some exertional dyspnea which could be more related to her hypertension deconditioning.  The chest pain she has when lifting objects is probably more related to musculoskeletal pain based on the location and not consistent with angina.

## 2018-04-03 NOTE — Assessment & Plan Note (Signed)
She is cutting down, not yet ready to quit.  Smoking cessation instruction/counseling given:  counseled patient on the dangers of tobacco use, advised patient to stop smoking, and reviewed strategies to maximize success

## 2018-04-03 NOTE — Assessment & Plan Note (Addendum)
At least class II diastolic dysfunction symptom and even some chest tightness.  As per previous instructions, should we can stop HCTZ and start chlorthalidone.  I am also can have her restart the lisinopril and increase to 20 mg daily.  With increasing ACE inhibitor and adding chlorthalidone, will check chemistry panel along with lipids.

## 2018-04-03 NOTE — Assessment & Plan Note (Addendum)
Multiple DES stents in LAD and RCA.  It is imperative that she stays on her antiplatelet agent.  I stressed again and again the importance of making sure that if nothing else she does not miss these medications.  Essentially would recommend lifelong antiplatelet agent.  She has noted some dark stools which may be nothing, we will check a CBC when she has her chemistry and lipids checked.

## 2018-04-03 NOTE — Assessment & Plan Note (Signed)
I again stressed to her the importance of taking her medications at this year minimum her antiplatelet agent.  She needs to contact the office if she is anywhere near running out of medicines.  I stressed to her the importance of keeping the stents open and reiterated that she is already had one heart attack as result of not taking her medications.  We have referred her to [place]

## 2018-04-03 NOTE — Assessment & Plan Note (Signed)
Clearly needs to lose weight.  Unfortunately she does not seem to be all that active with any kind of exercise.  I stressed the importance of staying active and trying to lose weight with diet and exercise.

## 2018-04-05 ENCOUNTER — Telehealth: Payer: Self-pay | Admitting: *Deleted

## 2018-04-05 MED ORDER — LISINOPRIL 20 MG PO TABS: 20.0000 mg | ORAL_TABLET | Freq: Every day | ORAL | 3 refills | Status: DC

## 2018-04-05 NOTE — Telephone Encounter (Signed)
-----   Message from Leonie Man, MD sent at 04/03/2018  3:22 PM EDT ----- Regarding: We did not add on AVS to restart lisinopril Ivin Booty, as part of the AVS we initially did not mention restarting lisinopril and the plan was to restart it 20 mg daily.  This is in addition to the carvedilol and chlorthalidone  Glenetta Hew, MD

## 2018-04-05 NOTE — Telephone Encounter (Signed)
SPOKE TO PATIENT . SHE IS AWARE OF THE INCREASE OF MEDICATION- LISINOPRIL TO 20 MG DAILY AND NEW PRESCRIPTION  E-SENT TO PHARMACY

## 2018-06-30 DIAGNOSIS — I255 Ischemic cardiomyopathy: Secondary | ICD-10-CM

## 2018-06-30 HISTORY — DX: Ischemic cardiomyopathy: I25.5

## 2018-07-03 ENCOUNTER — Inpatient Hospital Stay (HOSPITAL_COMMUNITY)
Admission: EM | Admit: 2018-07-03 | Discharge: 2018-07-06 | DRG: 246 | Disposition: A | Discharge status: Home / Self Care | Payer: Self-pay | Attending: Cardiovascular Disease | Admitting: Cardiovascular Disease

## 2018-07-03 ENCOUNTER — Encounter (HOSPITAL_COMMUNITY): Payer: Self-pay

## 2018-07-03 ENCOUNTER — Encounter (HOSPITAL_COMMUNITY)
Admission: EM | Disposition: A | Discharge status: Home / Self Care | Payer: Self-pay | Source: Home / Self Care | Attending: Cardiology

## 2018-07-03 ENCOUNTER — Inpatient Hospital Stay (HOSPITAL_COMMUNITY): Payer: Self-pay

## 2018-07-03 ENCOUNTER — Other Ambulatory Visit: Payer: Self-pay

## 2018-07-03 DIAGNOSIS — Z9119 Patient's noncompliance with other medical treatment and regimen: Secondary | ICD-10-CM

## 2018-07-03 DIAGNOSIS — Z720 Tobacco use: Secondary | ICD-10-CM | POA: Diagnosis present

## 2018-07-03 DIAGNOSIS — I2510 Atherosclerotic heart disease of native coronary artery without angina pectoris: Secondary | ICD-10-CM | POA: Diagnosis present

## 2018-07-03 DIAGNOSIS — R001 Bradycardia, unspecified: Secondary | ICD-10-CM | POA: Diagnosis not present

## 2018-07-03 DIAGNOSIS — I255 Ischemic cardiomyopathy: Secondary | ICD-10-CM | POA: Diagnosis present

## 2018-07-03 DIAGNOSIS — Z91120 Patient's intentional underdosing of medication regimen due to financial hardship: Secondary | ICD-10-CM

## 2018-07-03 DIAGNOSIS — Z6836 Body mass index (BMI) 36.0-36.9, adult: Secondary | ICD-10-CM

## 2018-07-03 DIAGNOSIS — Z91199 Patient's noncompliance with other medical treatment and regimen due to unspecified reason: Secondary | ICD-10-CM

## 2018-07-03 DIAGNOSIS — F329 Major depressive disorder, single episode, unspecified: Secondary | ICD-10-CM | POA: Diagnosis present

## 2018-07-03 DIAGNOSIS — IMO0002 Reserved for concepts with insufficient information to code with codable children: Secondary | ICD-10-CM | POA: Diagnosis present

## 2018-07-03 DIAGNOSIS — Z955 Presence of coronary angioplasty implant and graft: Secondary | ICD-10-CM

## 2018-07-03 DIAGNOSIS — Z716 Tobacco abuse counseling: Secondary | ICD-10-CM

## 2018-07-03 DIAGNOSIS — E1165 Type 2 diabetes mellitus with hyperglycemia: Secondary | ICD-10-CM | POA: Diagnosis present

## 2018-07-03 DIAGNOSIS — Z833 Family history of diabetes mellitus: Secondary | ICD-10-CM

## 2018-07-03 DIAGNOSIS — F17210 Nicotine dependence, cigarettes, uncomplicated: Secondary | ICD-10-CM | POA: Diagnosis present

## 2018-07-03 DIAGNOSIS — I25119 Atherosclerotic heart disease of native coronary artery with unspecified angina pectoris: Secondary | ICD-10-CM | POA: Diagnosis present

## 2018-07-03 DIAGNOSIS — I959 Hypotension, unspecified: Secondary | ICD-10-CM | POA: Diagnosis not present

## 2018-07-03 DIAGNOSIS — I252 Old myocardial infarction: Secondary | ICD-10-CM

## 2018-07-03 DIAGNOSIS — Z9851 Tubal ligation status: Secondary | ICD-10-CM

## 2018-07-03 DIAGNOSIS — Z791 Long term (current) use of non-steroidal anti-inflammatories (NSAID): Secondary | ICD-10-CM

## 2018-07-03 DIAGNOSIS — I10 Essential (primary) hypertension: Secondary | ICD-10-CM

## 2018-07-03 DIAGNOSIS — Z9049 Acquired absence of other specified parts of digestive tract: Secondary | ICD-10-CM

## 2018-07-03 DIAGNOSIS — Z79899 Other long term (current) drug therapy: Secondary | ICD-10-CM

## 2018-07-03 DIAGNOSIS — I110 Hypertensive heart disease with heart failure: Secondary | ICD-10-CM | POA: Diagnosis present

## 2018-07-03 DIAGNOSIS — I5043 Acute on chronic combined systolic (congestive) and diastolic (congestive) heart failure: Secondary | ICD-10-CM | POA: Diagnosis present

## 2018-07-03 DIAGNOSIS — Z7984 Long term (current) use of oral hypoglycemic drugs: Secondary | ICD-10-CM

## 2018-07-03 DIAGNOSIS — I5030 Unspecified diastolic (congestive) heart failure: Secondary | ICD-10-CM

## 2018-07-03 DIAGNOSIS — I2102 ST elevation (STEMI) myocardial infarction involving left anterior descending coronary artery: Principal | ICD-10-CM | POA: Diagnosis present

## 2018-07-03 DIAGNOSIS — I161 Hypertensive emergency: Secondary | ICD-10-CM | POA: Diagnosis present

## 2018-07-03 DIAGNOSIS — Z7982 Long term (current) use of aspirin: Secondary | ICD-10-CM

## 2018-07-03 DIAGNOSIS — E785 Hyperlipidemia, unspecified: Secondary | ICD-10-CM | POA: Diagnosis present

## 2018-07-03 DIAGNOSIS — I213 ST elevation (STEMI) myocardial infarction of unspecified site: Secondary | ICD-10-CM

## 2018-07-03 HISTORY — PX: LEFT HEART CATH AND CORONARY ANGIOGRAPHY: CATH118249

## 2018-07-03 HISTORY — PX: CORONARY STENT INTERVENTION: CATH118234

## 2018-07-03 LAB — GLUCOSE, CAPILLARY: Glucose-Capillary: 318 mg/dL — ABNORMAL HIGH (ref 70–99)

## 2018-07-03 LAB — BASIC METABOLIC PANEL
Anion gap: 9 (ref 5–15)
BUN: 7 mg/dL (ref 6–20)
CHLORIDE: 100 mmol/L (ref 98–111)
CO2: 25 mmol/L (ref 22–32)
Calcium: 9.7 mg/dL (ref 8.9–10.3)
Creatinine, Ser: 0.72 mg/dL (ref 0.44–1.00)
GFR calc Af Amer: >60 mL/min (ref >60)
GFR calc non Af Amer: >60 mL/min (ref >60)
Glucose, Bld: 336 mg/dL — ABNORMAL HIGH (ref 70–99)
POTASSIUM: 4.3 mmol/L (ref 3.5–5.1)
Sodium: 134 mmol/L — ABNORMAL LOW (ref 135–145)

## 2018-07-03 LAB — CBC
HEMATOCRIT: 45.9 % (ref 36.0–46.0)
Hemoglobin: 14 g/dL (ref 12.0–15.0)
MCH: 25.4 pg — ABNORMAL LOW (ref 26.0–34.0)
MCHC: 30.5 g/dL (ref 30.0–36.0)
MCV: 83.2 fL (ref 80.0–100.0)
Platelets: 446 10*3/uL — ABNORMAL HIGH (ref 150–400)
RBC: 5.52 MIL/uL — ABNORMAL HIGH (ref 3.87–5.11)
RDW: 13.9 % (ref 11.5–15.5)
WBC: 15.6 10*3/uL — ABNORMAL HIGH (ref 4.0–10.5)
nRBC: 0 % (ref 0.0–0.2)

## 2018-07-03 LAB — POCT I-STAT, CHEM 8
BUN: 8 mg/dL (ref 6–20)
Calcium, Ion: 1.17 mmol/L (ref 1.15–1.40)
Chloride: 101 mmol/L (ref 98–111)
Creatinine, Ser: 0.4 mg/dL — ABNORMAL LOW (ref 0.44–1.00)
GLUCOSE: 352 mg/dL — AB (ref 70–99)
HCT: 39 % (ref 36.0–46.0)
Hemoglobin: 13.3 g/dL (ref 12.0–15.0)
Potassium: 3.7 mmol/L (ref 3.5–5.1)
Sodium: 136 mmol/L (ref 135–145)
TCO2: 25 mmol/L (ref 22–32)

## 2018-07-03 LAB — POCT I-STAT TROPONIN I: Troponin i, poc: 0.49 ng/mL (ref 0.00–0.08)

## 2018-07-03 LAB — I-STAT BETA HCG BLOOD, ED (NOT ORDERABLE): I-stat hCG, quantitative: <5 m[IU]/mL (ref <5)

## 2018-07-03 LAB — SAMPLE TO BLOOD BANK

## 2018-07-03 LAB — HEMOGLOBIN AND HEMATOCRIT, BLOOD
HCT: 37.7 % (ref 36.0–46.0)
Hemoglobin: 11.4 g/dL — ABNORMAL LOW (ref 12.0–15.0)

## 2018-07-03 LAB — TROPONIN I: Troponin I: 28.03 ng/mL (ref <0.03)

## 2018-07-03 LAB — POCT ACTIVATED CLOTTING TIME: Activated Clotting Time: 615 seconds

## 2018-07-03 LAB — MRSA PCR SCREENING: MRSA by PCR: NEGATIVE

## 2018-07-03 SURGERY — CORONARY/GRAFT ACUTE MI REVASCULARIZATION
Anesthesia: LOCAL

## 2018-07-03 SURGERY — LEFT HEART CATH AND CORONARY ANGIOGRAPHY
Anesthesia: LOCAL

## 2018-07-03 MED ORDER — ACETAMINOPHEN 325 MG PO TABS
650.0000 mg | ORAL_TABLET | ORAL | Status: DC | PRN
  Administered 2018-07-04: 650 mg via ORAL
  Filled 2018-07-03: qty 2

## 2018-07-03 MED ORDER — SODIUM CHLORIDE 0.9% FLUSH
3.0000 mL | Freq: Two times a day (BID) | INTRAVENOUS | Status: DC
  Administered 2018-07-04 – 2018-07-05 (×4): 3 mL via INTRAVENOUS

## 2018-07-03 MED ORDER — SODIUM CHLORIDE 0.9 % IV SOLN: 250.0000 mL | INTRAVENOUS | Status: DC | PRN

## 2018-07-03 MED ORDER — METOPROLOL TARTRATE 5 MG/5ML IV SOLN
INTRAVENOUS | Status: DC | PRN
  Administered 2018-07-03 (×2): 5 mg via INTRAVENOUS

## 2018-07-03 MED ORDER — NITROGLYCERIN IN D5W 200-5 MCG/ML-% IV SOLN
INTRAVENOUS | Status: AC | PRN
  Administered 2018-07-03: 10 ug/min via INTRAVENOUS

## 2018-07-03 MED ORDER — TICAGRELOR 90 MG PO TABS
90.0000 mg | ORAL_TABLET | Freq: Two times a day (BID) | ORAL | Status: DC
  Administered 2018-07-04 – 2018-07-06 (×5): 90 mg via ORAL
  Filled 2018-07-03 (×5): qty 1

## 2018-07-03 MED ORDER — TICAGRELOR 90 MG PO TABS
ORAL_TABLET | ORAL | Status: DC | PRN
  Administered 2018-07-03: 180 mg via ORAL

## 2018-07-03 MED ORDER — ATROPINE SULFATE 1 MG/10ML IJ SOSY
PREFILLED_SYRINGE | INTRAMUSCULAR | Status: AC
  Administered 2018-07-03: 0.5 mg
  Filled 2018-07-03: qty 10

## 2018-07-03 MED ORDER — TIROFIBAN (AGGRASTAT) BOLUS VIA INFUSION
INTRAVENOUS | Status: DC | PRN
  Administered 2018-07-03: 2607.5 ug via INTRAVENOUS

## 2018-07-03 MED ORDER — MORPHINE SULFATE (PF) 4 MG/ML IV SOLN
4.0000 mg | Freq: Once | INTRAVENOUS | Status: AC
  Administered 2018-07-03: 4 mg via INTRAVENOUS
  Filled 2018-07-03: qty 1

## 2018-07-03 MED ORDER — TIROFIBAN HCL IN NACL 5-0.9 MG/100ML-% IV SOLN
INTRAVENOUS | Status: AC | PRN
  Administered 2018-07-03: 0.15 ug/kg/min via INTRAVENOUS

## 2018-07-03 MED ORDER — BIVALIRUDIN BOLUS VIA INFUSION - CUPID
INTRAVENOUS | Status: DC | PRN
  Administered 2018-07-03: 78.225 mg via INTRAVENOUS

## 2018-07-03 MED ORDER — TIROFIBAN HCL IN NACL 5-0.9 MG/100ML-% IV SOLN: 0.1500 ug/kg/min | INTRAVENOUS | Status: AC

## 2018-07-03 MED ORDER — HEPARIN (PORCINE) IN NACL 1000-0.9 UT/500ML-% IV SOLN
INTRAVENOUS | Status: AC
  Filled 2018-07-03: qty 1000

## 2018-07-03 MED ORDER — HYDRALAZINE HCL 20 MG/ML IJ SOLN: 5.0000 mg | INTRAMUSCULAR | Status: AC | PRN

## 2018-07-03 MED ORDER — FENTANYL CITRATE (PF) 100 MCG/2ML IJ SOLN
INTRAMUSCULAR | Status: AC
  Filled 2018-07-03: qty 2

## 2018-07-03 MED ORDER — LIDOCAINE HCL (PF) 1 % IJ SOLN
INTRAMUSCULAR | Status: AC
  Filled 2018-07-03: qty 30

## 2018-07-03 MED ORDER — VERAPAMIL HCL 2.5 MG/ML IV SOLN
INTRAVENOUS | Status: DC | PRN
  Administered 2018-07-03: 10 mL via INTRA_ARTERIAL

## 2018-07-03 MED ORDER — MORPHINE SULFATE (PF) 10 MG/ML IV SOLN
INTRAVENOUS | Status: AC
  Filled 2018-07-03: qty 1

## 2018-07-03 MED ORDER — INSULIN ASPART 100 UNIT/ML ~~LOC~~ SOLN: 0.0000 [IU] | Freq: Three times a day (TID) | SUBCUTANEOUS | Status: DC

## 2018-07-03 MED ORDER — METOPROLOL TARTRATE 5 MG/5ML IV SOLN
INTRAVENOUS | Status: AC
  Filled 2018-07-03: qty 5

## 2018-07-03 MED ORDER — NITROGLYCERIN IN D5W 200-5 MCG/ML-% IV SOLN
0.0000 ug/min | INTRAVENOUS | Status: DC
  Administered 2018-07-03: 15 ug/min via INTRAVENOUS

## 2018-07-03 MED ORDER — IOHEXOL 350 MG/ML SOLN
INTRAVENOUS | Status: DC | PRN
  Administered 2018-07-03: 160 mL via INTRAVENOUS

## 2018-07-03 MED ORDER — DOPAMINE-DEXTROSE 3.2-5 MG/ML-% IV SOLN
2.5000 ug/kg/min | INTRAVENOUS | Status: DC
  Administered 2018-07-03: 2.5 ug/kg/min via INTRAVENOUS

## 2018-07-03 MED ORDER — HEPARIN (PORCINE) IN NACL 1000-0.9 UT/500ML-% IV SOLN
INTRAVENOUS | Status: DC | PRN
  Administered 2018-07-03 (×2): 500 mL

## 2018-07-03 MED ORDER — ASPIRIN EC 81 MG PO TBEC
81.0000 mg | DELAYED_RELEASE_TABLET | Freq: Every day | ORAL | Status: DC
  Administered 2018-07-04 – 2018-07-06 (×3): 81 mg via ORAL
  Filled 2018-07-03 (×2): qty 1

## 2018-07-03 MED ORDER — BIVALIRUDIN TRIFLUOROACETATE 250 MG IV SOLR
INTRAVENOUS | Status: AC
  Filled 2018-07-03: qty 250

## 2018-07-03 MED ORDER — NITROGLYCERIN 1 MG/10 ML FOR IR/CATH LAB
INTRA_ARTERIAL | Status: DC | PRN
  Administered 2018-07-03 (×2): 200 ug via INTRACORONARY

## 2018-07-03 MED ORDER — MIDAZOLAM HCL 2 MG/2ML IJ SOLN
INTRAMUSCULAR | Status: DC | PRN
  Administered 2018-07-03: 1 mg via INTRAVENOUS
  Administered 2018-07-03: 2 mg via INTRAVENOUS

## 2018-07-03 MED ORDER — CARVEDILOL 3.125 MG PO TABS
3.1250 mg | ORAL_TABLET | Freq: Two times a day (BID) | ORAL | Status: DC
  Administered 2018-07-04 – 2018-07-06 (×5): 3.125 mg via ORAL
  Filled 2018-07-03 (×5): qty 1

## 2018-07-03 MED ORDER — SODIUM CHLORIDE 0.9 % IV SOLN
INTRAVENOUS | Status: AC
  Administered 2018-07-03: 20:00:00 via INTRAVENOUS

## 2018-07-03 MED ORDER — NITROGLYCERIN 0.4 MG SL SUBL: 0.4000 mg | SUBLINGUAL_TABLET | SUBLINGUAL | Status: DC | PRN

## 2018-07-03 MED ORDER — FENTANYL CITRATE (PF) 100 MCG/2ML IJ SOLN
INTRAMUSCULAR | Status: DC | PRN
  Administered 2018-07-03: 50 ug via INTRAVENOUS
  Administered 2018-07-03: 25 ug via INTRAVENOUS

## 2018-07-03 MED ORDER — SODIUM CHLORIDE 0.9 % IV SOLN
INTRAVENOUS | Status: DC | PRN
  Administered 2018-07-03: 1.75 mg/kg/h via INTRAVENOUS

## 2018-07-03 MED ORDER — MIDAZOLAM HCL 2 MG/2ML IJ SOLN
INTRAMUSCULAR | Status: AC
  Filled 2018-07-03: qty 2

## 2018-07-03 MED ORDER — NITROGLYCERIN 1 MG/10 ML FOR IR/CATH LAB
INTRA_ARTERIAL | Status: AC
  Filled 2018-07-03: qty 10

## 2018-07-03 MED ORDER — ONDANSETRON HCL 4 MG/2ML IJ SOLN: 4.0000 mg | Freq: Four times a day (QID) | INTRAMUSCULAR | Status: DC | PRN

## 2018-07-03 MED ORDER — TICAGRELOR 90 MG PO TABS
ORAL_TABLET | ORAL | Status: AC
  Filled 2018-07-03: qty 2

## 2018-07-03 MED ORDER — DOPAMINE-DEXTROSE 3.2-5 MG/ML-% IV SOLN
INTRAVENOUS | Status: AC
  Filled 2018-07-03: qty 250

## 2018-07-03 MED ORDER — ATORVASTATIN CALCIUM 40 MG PO TABS
40.0000 mg | ORAL_TABLET | Freq: Every day | ORAL | Status: DC
  Administered 2018-07-03: 40 mg via ORAL
  Filled 2018-07-03: qty 1

## 2018-07-03 MED ORDER — NITROGLYCERIN 0.4 MG SL SUBL
0.4000 mg | SUBLINGUAL_TABLET | SUBLINGUAL | Status: DC | PRN
  Administered 2018-07-03: 0.4 mg via SUBLINGUAL
  Filled 2018-07-03: qty 1

## 2018-07-03 MED ORDER — TIROFIBAN HCL IN NACL 5-0.9 MG/100ML-% IV SOLN
INTRAVENOUS | Status: AC
  Filled 2018-07-03: qty 100

## 2018-07-03 MED ORDER — HEPARIN SODIUM (PORCINE) 5000 UNIT/ML IJ SOLN
4000.0000 [IU] | Freq: Once | INTRAMUSCULAR | Status: AC
  Administered 2018-07-03: 4000 [IU] via INTRAVENOUS
  Filled 2018-07-03: qty 1

## 2018-07-03 MED ORDER — CARVEDILOL 12.5 MG PO TABS
12.5000 mg | ORAL_TABLET | Freq: Two times a day (BID) | ORAL | Status: DC
  Administered 2018-07-03: 12.5 mg via ORAL
  Filled 2018-07-03: qty 1

## 2018-07-03 MED ORDER — ASPIRIN 81 MG PO CHEW
324.0000 mg | CHEWABLE_TABLET | Freq: Once | ORAL | Status: AC
  Administered 2018-07-03: 324 mg via ORAL
  Filled 2018-07-03: qty 4

## 2018-07-03 MED ORDER — LIDOCAINE HCL (PF) 1 % IJ SOLN
INTRAMUSCULAR | Status: DC | PRN
  Administered 2018-07-03: 2 mL

## 2018-07-03 MED ORDER — LABETALOL HCL 5 MG/ML IV SOLN: 10.0000 mg | INTRAVENOUS | Status: DC | PRN

## 2018-07-03 MED ORDER — LISINOPRIL 20 MG PO TABS: 20.0000 mg | ORAL_TABLET | Freq: Every day | ORAL | Status: DC

## 2018-07-03 MED ORDER — SODIUM CHLORIDE 0.9% FLUSH: 3.0000 mL | INTRAVENOUS | Status: DC | PRN

## 2018-07-03 MED ORDER — VERAPAMIL HCL 2.5 MG/ML IV SOLN
INTRAVENOUS | Status: AC
  Filled 2018-07-03: qty 2

## 2018-07-03 SURGICAL SUPPLY — 19 items
BALLN SAPPHIRE 2.5X15 (BALLOONS) ×2
BALLN SAPPHIRE ~~LOC~~ 3.5X8 (BALLOONS) ×2 IMPLANT
BALLOON SAPPHIRE 2.5X15 (BALLOONS) ×1 IMPLANT
CATH INFINITI 5FR ANG PIGTAIL (CATHETERS) ×2 IMPLANT
CATH INFINITI JR4 5F (CATHETERS) ×2 IMPLANT
CATH VISTA GUIDE 6FR XBLAD3.5 (CATHETERS) ×2 IMPLANT
DEVICE RAD COMP TR BAND LRG (VASCULAR PRODUCTS) ×2 IMPLANT
GLIDESHEATH SLEND SS 6F .021 (SHEATH) ×2 IMPLANT
GUIDEWIRE INQWIRE 1.5J.035X260 (WIRE) ×1 IMPLANT
INQWIRE 1.5J .035X260CM (WIRE) ×2
KIT ENCORE 26 ADVANTAGE (KITS) ×2 IMPLANT
KIT HEART LEFT (KITS) ×2 IMPLANT
PACK CARDIAC CATHETERIZATION (CUSTOM PROCEDURE TRAY) ×2 IMPLANT
SHEATH PROBE COVER 6X72 (BAG) ×2 IMPLANT
STENT SYNERGY DES 3X12 (Permanent Stent) ×2 IMPLANT
SYR MEDRAD MARK 7 150ML (SYRINGE) ×2 IMPLANT
TRANSDUCER W/STOPCOCK (MISCELLANEOUS) ×2 IMPLANT
TUBING CIL FLEX 10 FLL-RA (TUBING) ×2 IMPLANT
WIRE MINAMO 190 (WIRE) ×2 IMPLANT

## 2018-07-03 NOTE — Progress Notes (Signed)
eLink Physician-Brief Progress Note Patient Name: COPELAND LAPIER DOB: 1970/08/28 MRN: [id number]   Date of Service  07/03/2018  HPI/Events of Note  Bradycardia and hypotension, associated with alteration of patient's mental status. She had been on a Nitroglycerine infusion for elevated blood pressure then suddenly became hypotensive. She did complain of vague chest pain.  eICU Interventions  Dopamine infusion @ 5 mcg/kg/min, 250 ml normal saline fluid bolus, pt had previously received 0.5 mg Atropine iv x 1 for bradycardia, stat EKG, Troponin, H 7 H ordered, pacing pads placed on the patient, I requested that RN notify Cardiology Fellow and Attending physician.        Kerry Kass Agron Swiney 07/03/2018, 11:15 PM

## 2018-07-03 NOTE — ED Notes (Signed)
ED Provider at bedside. 

## 2018-07-03 NOTE — ED Triage Notes (Signed)
Pt. Developed chest pain center radiates to her back described as rocks in her chest.  The pain began yesterday.  Pt. Took 3 Nitro did not help.  She is sob and nauseated.  Skin is warm and dry.  Pt. Is alert and oriented X4.  ECG being completed in triage.,

## 2018-07-03 NOTE — H&P (Addendum)
Cardiology Admission History and Physical:   Patient ID: Amy Chaney MRN: [id number]; DOB: October 28, 1970   Admission date: 07/03/2018  Primary Care Provider: [name], MD Primary Cardiologist: Had previously been seen by Dr. Golden Hurter.  Last STEMI performed by Dr. Ellyn Hack. Primary Electrophysiologist:  None   Chief Complaint: Chest pain, anterior STEMI  Patient Profile:  Well Amy Chaney is a 47 y.o. female with comp gated CAD history with RCA stenting in 2014 (Dr. Einar Gip) followed by anterior ST elevation in March 2019 where she underwent extensive LAD stenting in the bifurcation with diagonal branch that was lost despite crushing balloon stenting.  She had 3 overlapping stents placed in the LAD as well as the occluded stent in the diagonal.  She has significant medical history also of morbid obesity, diabetes mellitus, type II as well as hypertension and lipidemia and poor medical compliance. She was last seen in clinic on September 3.  History of Present Illness:   [name] was last seen in September and was doing relatively well with no active anginal symptoms.  Apparently since then she has been out of her medications because she "cannot afford them ".  Despite this she continues to smoke.  She started having chest pain last week on Thursday intermittently that was exertional.  Also associate with some dyspnea.  She describes it as a pressure sensation.  Things sort of stabilized out but at roughly midnight last night she again began having chest pressure and tightness that was not going away on its own.  She did come in last night because "her ride was drunk and could not drive ".  She finally had recurrence and worsening of pain at roughly 1630 this afternoon because her going to the emergency room.  Upon her emergency room evaluation she was found to have anterior ST elevations and code STEMI was called. She was having 9/10 chest pain at the time -described as a heavy  pressure sensation.  Was also noted worsening dyspnea and mild nausea with sweating.  Brief system delay -2 cath labs were active, and STEMI MD was in clinic across town.   Past Medical History:  Diagnosis Date  . Coronary artery disease involving native coronary artery of native heart with angina pectoris ([place]) 12/09/2014   a. s/p PCI of the mid RCA 01/2013 //  b. LHC 9/14: EF 55%, RCA stent 100% occluded, LAD irregularities >>  subacute stent thrombosis, Promus DES PCI distal overlap // c) 09/2017 - Ant STEMI - LAD-D2 PCI - Successful PCI of LAD (3 overlapping DES), unable to restore flow down D2l that was stented as well.  Likely related to downstream dissection, but unable to rewire.  . Daily headache   . Depression   . Dyslipidemia, goal LDL below 70   . History of Doppler ultrasound    carotid bruit >> a. Carotid US 6/17: bilat ICA 1-39%  . Hypertension   . Prolonged Q-T interval on ECG 01/11/2016  . STEMI involving left anterior descending coronary artery ([place]) 09/2017   Severe [place]ina 1, 1, 1 LAD-D2 lesion (complicated by post PTCA dissection/intramural hematoma) -successful extensive PCI of the LAD but unable to maintain patency of the stented D2.   Marland Kitchen STEMI involving oth coronary artery of inferior wall ([place]) 2014   Secondary to subacute stent thrombosis of RCA stent segment having missed 4 doses of Effient.  . Tobacco abuse 01/11/2016  . Type II diabetes mellitus ([place]) 12/2010   sister and son also  diabetic     Past Surgical History:  Procedure Laterality Date  . CHOLECYSTECTOMY  ~ 2000  . CORONARY STENT INTERVENTION N/A 10/10/2017   Procedure: CORONARY STENT INTERVENTION;  Surgeon: Leonie Man, MD;  Location: [facility];  Service: Cardiovascular; LAD PCI: STENT SYNERGY DES 3.5X32 (p),STENT SYNERGY DES 3.5X 8 (m), STENT SYNERGY DES 3.5X28 (d).  D2 PCI: STENT SYNERGY DES 2.5X24.  (UNABL TO REWIRE & EXPAND - TO OF DIAG AT END OF CASE)  . CORONARY STENT INTERVENTION N/A  07/03/2018   Procedure: CORONARY STENT INTERVENTION;  Surgeon: Leonie Man, MD;  Location: [facility];  Service: Cardiovascular;  Laterality: N/A;  . LEFT HEART CATH AND CORONARY ANGIOGRAPHY N/A 10/10/2017   Procedure: LEFT HEART CATH AND CORONARY ANGIOGRAPHY;  Surgeon: Leonie Man, MD;  Location: [facility];  Service: Cardiovascular;  Laterality: N/A; -patent RCA stents with mild in-stent restenosis. [place] 1,1,1 mLD-D2 61% (complicated by dissection/intramural thrombus)  . LEFT HEART CATH AND CORONARY ANGIOGRAPHY N/A 07/03/2018   Procedure: LEFT HEART CATH AND CORONARY ANGIOGRAPHY;  Surgeon: Leonie Man, MD;  Location: [facility];  Service: Cardiovascular;  Laterality: N/A;  . LEFT HEART CATHETERIZATION WITH CORONARY ANGIOGRAM N/A 02/25/2013   Procedure: LEFT HEART CATHETERIZATION WITH CORONARY ANGIOGRAM;  Surgeon: Laverda Page, MD;  Location: [facility] CATH LAB;  Service: Cardiovascular;; CTO m RCA; otherwise normal coronaries  . LEFT HEART CATHETERIZATION WITH CORONARY ANGIOGRAM N/A 04/01/2013   Procedure: LEFT HEART CATHETERIZATION WITH CORONARY ANGIOGRAM;  Surgeon: Laverda Page, MD;  Location: [facility] CATH LAB;  Service: Cardiovascular: 100% occlusion of distal RCA stent (subacute stent thrombosis -  secondary to stopping Effient).  Overlapping DES PCI  . NM MYOVIEW LTD  12/2015    EF 38%.  Hypertensive response to exercise (231/132 mmHg).  INTERMEDIATE RISK due to -diffuse hypokinesis and reduced EF.  No ischemia or infarction.  Marland Kitchen PERCUTANEOUS CORONARY STENT INTERVENTION (PCI-S)  04/01/2013   Procedure: PERCUTANEOUS CORONARY STENT INTERVENTION (PCI-S);  Surgeon: Laverda Page, MD;  Location: [facility] CATH LAB;  Service: Cardiovascular;; PCI of distal RCA stent subacute thrombosis: Promus Premier DES 3.0 mm x 20 mm.  Marland Kitchen PERCUTANEOUS CORONARY STENT INTERVENTION (PCI-S)  02/25/2013   Procedure: PERCUTANEOUS CORONARY STENT INTERVENTION (PCI-S);  Surgeon: Laverda Page,  MD;  Location: [facility] CATH LAB;  RCA CTO PCI with overlapping Promus DES: 3.0 mm x 38 mm, 3.5 mm x 18m  . TRANSTHORACIC ECHOCARDIOGRAM  12/2015   mild LVH, EF 55-60%, no RWMA, Gr 1 DD, mild MR  . TRANSTHORACIC ECHOCARDIOGRAM  09/2017   In setting of anterior STEMI:  EF 35% with mild concentric hypertrophy.  GRII DD.  Anteroseptal, anterior and anterolateral walls hypokinetic.  Moderate MR.  . TUBAL LIGATION  1994     Medications Prior to Admission: Prior to Admission medications   Medication Sig Start Date End Date Taking? Authorizing Provider  aspirin EC 81 MG tablet Take 1 tablet (81 mg total) by mouth daily. 10/14/17   [facility], ATami Lin PA  atorvastatin (LIPITOR) 40 MG tablet Take 1 tablet (40 mg total) by mouth daily. 04/02/18   HLeonie Man MD  Blood Glucose Monitoring Suppl (TRUE METRIX METER) DEVI 1 kit by Does not apply route 4 (four) times daily. 10/24/17   NBrayton Caves PA-C  carvedilol (COREG) 12.5 MG tablet Take 1 tablet (12.5 mg total) by mouth 2 (two) times daily. To replace metoprolol 04/02/18 07/01/18  HGlenetta Hew  W, MD  chlorthalidone (HYGROTON) 25 MG tablet Take 1 tablet (25 mg total) by mouth daily. 04/02/18 07/01/18  Leonie Man, MD  glipiZIDE (GLUCOTROL) 10 MG tablet Take 1 tablet (10 mg total) by mouth 2 (two) times daily before a meal. 04/02/18   Leonie Man, MD  glucose blood (TRUE METRIX BLOOD GLUCOSE TEST) test strip Use as instructed 10/24/17   Brayton Caves, PA-C  lisinopril (PRINIVIL,ZESTRIL) 20 MG tablet Take 1 tablet (20 mg total) by mouth daily. 04/05/18 07/04/18  Leonie Man, MD  naproxen sodium (ALEVE) 220 MG tablet Take 220 mg by mouth 2 (two) times daily as needed.    [provider]  nitroGLYCERIN (NITROSTAT) 0.4 MG SL tablet Place 1 tablet (0.4 mg total) under the tongue every 5 (five) minutes as needed for chest pain. Reported on 11/25/2015 10/14/17   Ledora Bottcher, PA  prasugrel (EFFIENT) 10 MG TABS tablet Take 1 tablet (10 mg  total) by mouth daily. 10/15/17   [facility], Tami Lin, PA  TRUEPLUS LANCETS 28G MISC 28 g by Does not apply route 4 (four) times daily. 10/24/17   Brayton Caves, PA-C     Allergies:   No Known Allergies  Social History:   Social History   Tobacco Use  . Smoking status: Current Every Day Smoker    Packs/day: 0.50    Years: 22.00    Pack years: 11.00    Types: Cigarettes  . Smokeless tobacco: Never Used  Substance Use Topics  . Alcohol use: Yes    Alcohol/week: 0.0 standard drinks    Comment: 07/04/2018 "only on special occasions; maybe once/yr"  . Drug use: Yes    Types: Marijuana    Comment: 02/25/2013 "quit marijuana ~ 2001"   Social History   Social History Narrative  . Not on file     Family History:   The patient's family history includes Diabetes in her mother, sister, sister, son, and son.    ROS: As noted decreased activity over the last couple weeks but started having chest pain a week or so ago intermittently and then began last night at midnight.  Has had dyspnea on exertion.  Mild nausea and dyspnea with current pain. Please see the history of present illness.  All other ROS reviewed and negative.     Physical Exam/Data:   Vitals:   07/05/18 0418 07/05/18 0813 07/05/18 1207 07/05/18 1656  BP: (!) 89/56 109/71 127/64 131/75  Pulse: 75 66 68 69  Resp: _0 Temp: 98.2 F (36.8 C) 98.2 F (36.8 C) 98.2 F (36.8 C) 98.6 F (37 C)  TempSrc: Oral Oral Oral Oral  SpO2: 100% 100% 100% 100%  Weight:      Height:        Intake/Output Summary (Last 24 hours) at 07/05/2018 2045 Last data filed at 07/05/2018 1600 Gross per 24 hour  Intake 360 ml  Output -  Net 360 ml   Filed Weights   07/03/18 1730 07/04/18 2300  Weight: 104.3 kg 103.7 kg   Body mass index is 33.76 kg/m.  General:  Well nourished, well developed, in notable distress with significant chest pain HEENT: normal Lymph: no adenopathy -difficult to assess Neck: no JVD or carotid  bruit. Endocrine:  No thryomegaly Vascular: No carotid bruits; FA pulses 2+ bilaterally without bruits  Cardiac:  normal S1, S2; RRR; no murmur  -very distant heart sounds. Lungs:  clear to auscultation bilaterally, no wheezing, rhonchi or  rales  Abd: soft, nontender, no hepatomegaly  Ext: no clubbing, cyanosis or edema Musculoskeletal:  No deformities, BUE and BLE strength normal and equal Skin: warm and dry  Neuro:  CNs 2-12 intact, no focal abnormalities noted Psych:  Normal affect    EKG:  The ECG that was done upon arrival to the ER showed sinus rhythm rate 99 bpm.  LVH with repolarization, but ST elevation noted in V2 V3 and V4.  Most prominently in V2 and V3 roughly 3 mm.  Only 1.5 to 2 mm in V4.  ST flattening in lateral leads but no real depression noted.  EKG was personally reviewed.  Relevant CV Studies:   Transthoracic echo: EF 35% with mild concentric hypertrophy.  GRII DD.  Anteroseptal, anterior and anterolateral walls hypokinetic.  Moderate MR.  Cardiac cath-PCI on October 10, 2017: Successful PCI of LAD, but was unable to restore flow down the diagonal that was stented as well.  Likely related to downstream dissection, but unable to rewire. ? LAD PCI: STENT SYNERGY DES 3.5X32 (p),STENT SYNERGY DES 3.5X 8 (m), STENT SYNERGY DES 3.5X28 (d) ? D2 PCI: STENT SYNERGY DES 2.5X24.  (UNABL TO REWIRE & EXPAND - TO OF DIAG AT END OF CASE)  Diagnostic Diagram                                                  Post-Intervention Diagram       Laboratory Data:  Chemistry Recent Labs  Lab 07/04/18 0312 07/05/18 0330  NA 134* 137  K 3.9 3.5  CL 102 104  CO2 22 22  GLUCOSE 375* 217*  BUN 8 12  CREATININE 0.69 0.74  CALCIUM 8.7* 8.7*  GFRNONAA >60 >60  GFRAA >60 >60  ANIONGAP 10 11    No results for input(s): PROT, ALBUMIN, AST, ALT, ALKPHOS, BILITOT in the last 168 hours. Hematology Recent Labs  Lab 07/04/18 0312 07/05/18 0330  WBC 13.9* 14.0*  RBC 4.69 4.54  HGB  12.2 11.8*  HCT 39.0 38.0  MCV 83.2 83.7  MCH 26.0 26.0  MCHC 31.3 31.1  RDW 14.1 14.2  PLT 348 303   Cardiac Enzymes Recent Labs  Lab 07/04/18 1259 07/04/18 2136 07/05/18 0019  TROPONINI 12.97* 10.87* 12.16*    Recent Labs  Lab 07/03/18 1753  TROPIPOC 0.49*    BNPNo results for input(s): BNP, PROBNP in the last 168 hours.  DDimer No results for input(s): DDIMER in the last 168 hours.  Radiology/Studies:  No results found.  Assessment and Plan:   Principal Problem:   Acute ST elevation myocardial infarction (STEMI) involving left anterior descending (LAD) coronary artery (HCC) Active Problems:   Coronary artery disease involving native coronary artery of native heart with angina pectoris ([place])   H/O noncompliance with medical treatment, presenting hazards to health   Morbid obesity ([place]): BMI ~36 with HTN, HLD, CAD   Hypertension, accelerated, with diastolic congestive heart failure, NYHA class 1 (HCC)   Diabetes type 2, uncontrolled ([place])   Tobacco abuse   Dyslipidemia, goal LDL below 70   Acute on chronic combined systolic and diastolic CHF, NYHA class 3 (HCC)  Acute anterior ST elevation in a patient with significant LAD PCI back in March of this year.  She has been noncompliant with medications, not having taken most of her medications in the last couple  months.  She states that she has not been out for them.  But never communicated with the office that she was out of medications.  She quite likely as an occluded LAD likely within the stent segment.  Plan for emergent cardiac catheterization.  I saw her en route to the Cath Lab.  We will need case manager/social work to assist with making sure that she gets her medications prior to discharge.  Likely needs health wellness center assistance.  For now we will place on sliding scale insulin, high-dose statin, back on dual endplate therapy with aspirin and Brilinta.  We will restart beta-blocker and likely ACE  inhibitor depending on her blood pressures post-cath.  Still relatively euvolemic on exam.  Will likely be on hold off on diuretic.  Severity of Illness: The appropriate patient status for this patient is INPATIENT. Inpatient status is judged to be reasonable and necessary in order to provide the required intensity of service to ensure the patient's safety. The patient's presenting symptoms, physical exam findings, and initial radiographic and laboratory data in the context of their chronic comorbidities is felt to place them at high risk for further clinical deterioration. Furthermore, it is not anticipated that the patient will be medically stable for discharge from the hospital within 2 midnights of admission. The following factors support the patient status of inpatient.   " The patient's presenting symptoms include acute coronary syndrome angina symptoms.. " The worrisome physical exam findings include notably distressed, uncomfortable, hypertensive and tachycardic.. " The initial radiographic and laboratory data are worrisome because of ST elevations on EKG and anterior leads with known anterior CAD.. " The chronic co-morbidities include known prior CAD history with stent to the LAD and RCA, diabetes mellitus, type II, hyperlipidemia and hypertension as well as medical nonadherence..   * I certify that at the point of admission it is my clinical judgment that the patient will require inpatient hospital care spanning beyond 2 midnights from the point of admission due to high intensity of service, high risk for further deterioration and high frequency of surveillance required.*    For questions or updates, please contact [place] Please consult [email or web address] for contact info under        Signed, Glenetta Hew, MD  07/05/2018 8:45 PM

## 2018-07-03 NOTE — Progress Notes (Signed)
ANTICOAGULATION CONSULT NOTE - Initial Consult  Pharmacy Consult for tirofiban Indication: chest pain/ACS  No Known Allergies  Patient Measurements: Height: 5\' 9"  (175.3 cm) Weight: 230 lb (104.3 kg) IBW/kg (Calculated) : 66.2  Vital Signs: Temp: 97.8 F (36.6 C) (12/04 1727) Temp Source: Oral (12/04 1727) BP: 153/84 (12/04 1904) Pulse Rate: 0 (12/04 1919)  Labs: Recent Labs    07/03/18 1732 07/03/18 1816  HGB 14.0 13.3  HCT 45.9 39.0  PLT 446*  --   CREATININE 0.72 0.40*    Estimated Creatinine Clearance: 110.5 mL/min (A) (by C-G formula based on SCr of 0.4 mg/dL (L)).   Medical History: Past Medical History:  Diagnosis Date  . Coronary artery disease involving native coronary artery of native heart with angina pectoris ([place]) 12/09/2014   a. s/p PCI of the mid RCA 01/2013 //  b. LHC 9/14: EF 55%, RCA stent 100% occluded, LAD irregularities >>  subacute stent thrombosis, Promus DES PCI distal overlap // c) 09/2017 - Ant STEMI - LAD-D2 PCI - Successful PCI of LAD (3 overlapping DES), unable to restore flow down D2l that was stented as well.  Likely related to downstream dissection, but unable to rewire.  . Daily headache   . Depression   . Dyslipidemia, goal LDL below 70   . History of Doppler ultrasound    carotid bruit >> a. Carotid US 6/17: bilat ICA 1-39%  . Hypertension   . Prolonged Q-T interval on ECG 01/11/2016  . STEMI involving left anterior descending coronary artery ([place]) 09/2017   Severe [place] 1, 1, 1 LAD-D2 lesion (complicated by post PTCA dissection/intramural hematoma) -successful extensive PCI of the LAD but unable to maintain patency of the stented D2.   Marland Kitchen STEMI involving oth coronary artery of inferior wall ([place]) 2014   Secondary to subacute stent thrombosis of RCA stent segment having missed 4 doses of Effient.  . Tobacco abuse 01/11/2016  . Type II diabetes mellitus ([place]) 12/2010   sister and son also diabetic      Assessment: 57 yoF with  significant CAD hx presented as code STEMI now s/p new DES. Pt started on IV tirofiban in cath lab and to continue for three hours or until current bag runs out.  Goal of Therapy:  Monitor platelets by anticoagulation protocol: Yes   Plan:  -Tirofiban 0.15 mcg/kg/min x3 hr or until current bag runs out -Pharmacy will sign off, reconsult if needed  Arrie Senate, PharmD, BCPS Clinical Pharmacist [phone] Please check AMION for all [place] numbers 07/03/2018

## 2018-07-03 NOTE — ED Provider Notes (Signed)
[facility] EMERGENCY DEPARTMENT Provider Note   CSN: [id number] Arrival date & time: 07/03/18  1718     History   Chief Complaint Chief Complaint  Patient presents with  . Chest Pain  . Shortness of Breath  . Nausea    HPI Amy Chaney is a 47 y.o. female.  HPI  47 y/o comes in with cc of chest pain. Pt has known CAD s/p multiple stents placed in 2014 and again 09/2017. Pt has not been taking her meds for the last 3 months as she cant afford it. She reports that she started having generalized chest pain last Thursday, and the pain has been coming about daily. Pain became constant last night and described as severe, pressure type pain. She has associated dib.  Past Medical History:  Diagnosis Date  . Coronary artery disease involving native coronary artery of native heart with angina pectoris ([place]) 12/09/2014   a. s/p PCI of the mid RCA 01/2013 //  b. LHC 9/14: EF 55%, RCA stent 100% occluded, LAD irregularities >>  subacute stent thrombosis, Promus DES PCI distal overlap // c) 09/2017 - Ant STEMI - LAD-D2 PCI - Successful PCI of LAD (3 overlapping DES), unable to restore flow down D2l that was stented as well.  Likely related to downstream dissection, but unable to rewire.  . Daily headache   . Depression   . Dyslipidemia, goal LDL below 70   . History of Doppler ultrasound    carotid bruit >> a. Carotid US 6/17: bilat ICA 1-39%  . Hypertension   . Prolonged Q-T interval on ECG 01/11/2016  . STEMI involving left anterior descending coronary artery ([place]) 09/2017   Severe [place]ina 1, 1, 1 LAD-D2 lesion (complicated by post PTCA dissection/intramural hematoma) -successful extensive PCI of the LAD but unable to maintain patency of the stented D2.   Marland Kitchen STEMI involving oth coronary artery of inferior wall ([place]) 2014   Secondary to subacute stent thrombosis of RCA stent segment having missed 4 doses of Effient.  . Tobacco abuse 01/11/2016  . Type II diabetes  mellitus ([place]) 12/2010   sister and son also diabetic     Patient Active Problem List   Diagnosis Date Noted  . Dark stools 04/03/2018  . Hypertension, accelerated, with diastolic congestive heart failure, NYHA class 1 ([place]) 12/30/2017  . STEMI involving left anterior descending coronary artery ([place]) 10/10/2017  . MI, acute, non ST segment elevation ([place])   . Chest pain 01/11/2016  . Carotid artery disease ([place]) 01/11/2016  . Prolonged Q-T interval on ECG 01/11/2016  . Tobacco abuse counseling 01/11/2016  . Heart palpitations 01/11/2016  . Morbid obesity ([place]): BMI ~36 with HTN, HLD, CAD 11/25/2015  . Smoker 03/03/2015  . Coronary artery disease involving native coronary artery of native heart with angina pectoris ([place]) 12/09/2014  . Chronic combined systolic (congestive) and diastolic (congestive) heart failure ([place]) 12/09/2014  . H/O noncompliance with medical treatment, presenting hazards to health 12/09/2014  . S/P primary angioplasty with coronary stent 11/18/2014  . Diabetes type 2, uncontrolled ([place]) 04/01/2013  . Hyperlipidemia associated with type 2 diabetes mellitus ([place]) 04/01/2013  . Depression 04/01/2013  . Stress at home 04/01/2013  . Essential hypertension 09/05/2011    Past Surgical History:  Procedure Laterality Date  . CHOLECYSTECTOMY  ~ 2000  . CORONARY STENT INTERVENTION N/A 10/10/2017   Procedure: CORONARY STENT INTERVENTION;  Surgeon: Leonie Man, MD;  Location: [facility];  Service: Cardiovascular; LAD  PCI: STENT SYNERGY DES 3.5X32 (p),STENT SYNERGY DES 3.5X 8 (m), STENT SYNERGY DES 3.5X28 (d).  D2 PCI: STENT SYNERGY DES 2.5X24.  (UNABL TO REWIRE & EXPAND - TO OF DIAG AT END OF CASE)  . LEFT HEART CATH AND CORONARY ANGIOGRAPHY N/A 10/10/2017   Procedure: LEFT HEART CATH AND CORONARY ANGIOGRAPHY;  Surgeon: Leonie Man, MD;  Location: [facility];  Service: Cardiovascular;  Laterality: N/A; -patent RCA stents with mild in-stent restenosis.  [place] 1,1,1 mLD-D2 46% (complicated by dissection/intramural thrombus)  . LEFT HEART CATHETERIZATION WITH CORONARY ANGIOGRAM N/A 02/25/2013   Procedure: LEFT HEART CATHETERIZATION WITH CORONARY ANGIOGRAM;  Surgeon: Laverda Page, MD;  Location: [facility] CATH LAB;  Service: Cardiovascular;; CTO m RCA; otherwise normal coronaries  . LEFT HEART CATHETERIZATION WITH CORONARY ANGIOGRAM N/A 04/01/2013   Procedure: LEFT HEART CATHETERIZATION WITH CORONARY ANGIOGRAM;  Surgeon: Laverda Page, MD;  Location: [facility] CATH LAB;  Service: Cardiovascular: 100% occlusion of distal RCA stent (subacute stent thrombosis -  secondary to stopping Effient).  Overlapping DES PCI  . NM MYOVIEW LTD  12/2015    EF 38%.  Hypertensive response to exercise (231/132 mmHg).  INTERMEDIATE RISK due to -diffuse hypokinesis and reduced EF.  No ischemia or infarction.  Marland Kitchen PERCUTANEOUS CORONARY STENT INTERVENTION (PCI-S)  04/01/2013   Procedure: PERCUTANEOUS CORONARY STENT INTERVENTION (PCI-S);  Surgeon: Laverda Page, MD;  Location: [facility] CATH LAB;  Service: Cardiovascular;; PCI of distal RCA stent subacute thrombosis: Promus Premier DES 3.0 mm x 20 mm.  Marland Kitchen PERCUTANEOUS CORONARY STENT INTERVENTION (PCI-S)  02/25/2013   Procedure: PERCUTANEOUS CORONARY STENT INTERVENTION (PCI-S);  Surgeon: Laverda Page, MD;  Location: [facility] CATH LAB;  RCA CTO PCI with overlapping Promus DES: 3.0 mm x 38 mm, 3.5 mm x 36m  . TRANSTHORACIC ECHOCARDIOGRAM  12/2015   mild LVH, EF 55-60%, no RWMA, Gr 1 DD, mild MR  . TRANSTHORACIC ECHOCARDIOGRAM  09/2017   In setting of anterior STEMI:  EF 35% with mild concentric hypertrophy.  GRII DD.  Anteroseptal, anterior and anterolateral walls hypokinetic.  Moderate MR.  . TUBAL LIGATION  1994     OB History    Gravida  3   Para  3   Term  3   Preterm  0   AB  0   Living  3     SAB  0   TAB  0   Ectopic  0   Multiple  0   Live Births               Home Medications    Prior to Admission  medications   Medication Sig Start Date End Date Taking? Authorizing Provider  aspirin EC 81 MG tablet Take 1 tablet (81 mg total) by mouth daily. 10/14/17   [facility], ATami Lin PA  atorvastatin (LIPITOR) 40 MG tablet Take 1 tablet (40 mg total) by mouth daily. 04/02/18   HLeonie Man MD  Blood Glucose Monitoring Suppl (TRUE METRIX METER) DEVI 1 kit by Does not apply route 4 (four) times daily. 10/24/17   NBrayton Caves PA-C  carvedilol (COREG) 12.5 MG tablet Take 1 tablet (12.5 mg total) by mouth 2 (two) times daily. To replace metoprolol 04/02/18 07/01/18  HLeonie Man MD  chlorthalidone (HYGROTON) 25 MG tablet Take 1 tablet (25 mg total) by mouth daily. 04/02/18 07/01/18  HLeonie Man MD  glipiZIDE (GLUCOTROL) 10 MG tablet Take 1 tablet (10 mg total) by mouth 2 (two) times  daily before a meal. 04/02/18   Leonie Man, MD  glucose blood (TRUE METRIX BLOOD GLUCOSE TEST) test strip Use as instructed 10/24/17   Brayton Caves, PA-C  lisinopril (PRINIVIL,ZESTRIL) 20 MG tablet Take 1 tablet (20 mg total) by mouth daily. 04/05/18 07/04/18  Leonie Man, MD  naproxen sodium (ALEVE) 220 MG tablet Take 220 mg by mouth 2 (two) times daily as needed.    [provider]  nitroGLYCERIN (NITROSTAT) 0.4 MG SL tablet Place 1 tablet (0.4 mg total) under the tongue every 5 (five) minutes as needed for chest pain. Reported on 11/25/2015 10/14/17   Ledora Bottcher, PA  prasugrel (EFFIENT) 10 MG TABS tablet Take 1 tablet (10 mg total) by mouth daily. 10/15/17   [facility], Tami Lin, PA  TRUEPLUS LANCETS 28G MISC 28 g by Does not apply route 4 (four) times daily. 10/24/17   Brayton Caves, PA-C    Family History Family History  Problem Relation Age of Onset  . Diabetes Mother   . Diabetes Sister   . Diabetes Sister   . Diabetes Son        type 1  . Diabetes Son        type 2    Social History Social History   Tobacco Use  . Smoking status: Current Every Day Smoker    Packs/day: 0.50      Years: 15.00    Pack years: 7.50    Types: Cigarettes  . Smokeless tobacco: Never Used  . Tobacco comment: states she has cut back to 1-3 cigarettes/day   Substance Use Topics  . Alcohol use: Yes    Alcohol/week: 0.0 standard drinks    Comment: 02/25/2013 "only on special occasions; maybe once/yr"  . Drug use: Yes    Types: Marijuana    Comment: 02/25/2013 "quit marijuana ~ 2001"     Allergies   Patient has no known allergies.   Review of Systems Review of Systems  Constitutional: Positive for activity change.  Respiratory: Positive for shortness of breath.   Cardiovascular: Positive for chest pain.  Hematological: Does not bruise/bleed easily.  All other systems reviewed and are negative.    Physical Exam Updated Vital Signs BP (!) 180/95 (BP Location: Right Arm)   Pulse 96   Temp 97.8 F (36.6 C) (Oral)   Resp 20   Ht 5' 9"  (1.753 m)   Wt 104.3 kg   LMP 10/20/2015   SpO2 100%   BMI 33.97 kg/m   Physical Exam  Constitutional: She is oriented to person, place, and time. She appears well-developed.  HENT:  Head: Normocephalic and atraumatic.  Eyes: EOM are normal.  Neck: Normal range of motion. Neck supple.  Cardiovascular: Normal rate, intact distal pulses and normal pulses.  Pulmonary/Chest: Effort normal.  Abdominal: Bowel sounds are normal.  Musculoskeletal:       Right lower leg: She exhibits no edema.       Left lower leg: She exhibits no edema.  Neurological: She is alert and oriented to person, place, and time.  Skin: Skin is warm and dry.  Nursing note and vitals reviewed.    ED Treatments / Results  Labs (all labs ordered are listed, but only abnormal results are displayed) Labs Reviewed  BASIC METABOLIC PANEL  CBC  I-STAT TROPONIN, ED  I-STAT BETA HCG BLOOD, ED ([facility], [facility], [facility] ONLY)  CBG MONITORING, ED  SAMPLE TO BLOOD BANK    EKG None ED ECG REPORT  Date: 07/03/2018  Rate: 99  Rhythm: normal sinus rhythm  QRS Axis: left   Intervals: normal  ST/T Wave abnormalities: ST elevation anteriorly  Conduction Disutrbances:none  Narrative Interpretation:   Old EKG Reviewed: changes noted  I have personally reviewed the EKG tracing and agree with the computerized printout as noted.    Radiology No results found.  Procedures .Critical Care Performed by: Varney Biles, MD Authorized by: Varney Biles, MD   Critical care provider statement:    Critical care time (minutes):  35   Critical care start time:  07/03/2018 5:18 PM   Critical care end time:  07/03/2018 5:59 PM   Critical care time was exclusive of:  Separately billable procedures and treating other patients   Critical care was necessary to treat or prevent imminent or life-threatening deterioration of the following conditions:  Cardiac failure   Critical care was time spent personally by me on the following activities:  Discussions with consultants, evaluation of patient's response to treatment, examination of patient, ordering and performing treatments and interventions, ordering and review of laboratory studies, ordering and review of radiographic studies, pulse oximetry, re-evaluation of patient's condition, obtaining history from patient or surrogate and review of old charts   I assumed direction of critical care for this patient from another provider in my specialty: yes       Smoking cessation instruction/counseling given:  counseled patient on the dangers of tobacco use, advised patient to stop smoking, and reviewed strategies to maximize success. Discussion 2-3 min. (including critical care time)  Medications Ordered in ED Medications  nitroGLYCERIN (NITROSTAT) SL tablet 0.4 mg (has no administration in time range)  aspirin chewable tablet 324 mg (324 mg Oral Given 07/03/18 1748)  heparin injection 4,000 Units (4,000 Units Intravenous Given 07/03/18 1748)  morphine 4 MG/ML injection 4 mg (4 mg Intravenous Given 07/03/18 1748)     Initial  Impression / Assessment and Plan / ED Course  I have reviewed the triage vital signs and the nursing notes.  Pertinent labs & imaging results that were available during my care of the patient were reviewed by me and considered in my medical decision making (see chart for details).     Pt comes in with cc of chest pain. She has known hx of CAD and reports that her pain is similar to her STEMI. Unfortunately, pt is a smoker and has been non compliant with her meds due to inability to afford meds.  Dr. Ellyn Hack, Cards to see. STEMI activated. Chest pain 7/10. Heparin, aspirin, morphine and 1 SL nitro given.  Final Clinical Impressions(s) / ED Diagnoses   Final diagnoses:  ST elevation myocardial infarction (STEMI), unspecified artery ([place])  STEMI (ST elevation myocardial infarction) [facility])    ED Discharge Orders    None       Varney Biles, MD 07/03/18 1801

## 2018-07-03 NOTE — Progress Notes (Signed)
Brief cardiology update:  Patient had episode of bradycardia into the 40s along with hypotension with maps in the 50s and associated altered mental status after having blood pressure consistently in the 180s on nitroglycerin drip at 50 mcgs.  Agreed with management per CCM of transitioning discontinue nitroglycerin continuous infusion.  She has been noncompliant with all her medical therapy including blood pressure meds and antiplatelet therapy which likely contributed to her stent thrombosis earlier today.  She did receive her dose of Coreg 12.5 with known ischemic cardiomyopathy with EF in the 25-35% range.   Plan: -We will cut beta-blocker to lower dose tomorrow. -Continue sedation of ACE inhibitor -Okay to continue gentle volume resuscitation with plan to discontinue shortly.     Michie

## 2018-07-04 ENCOUNTER — Other Ambulatory Visit: Payer: Self-pay

## 2018-07-04 ENCOUNTER — Inpatient Hospital Stay (HOSPITAL_COMMUNITY): Payer: Self-pay

## 2018-07-04 ENCOUNTER — Encounter (HOSPITAL_COMMUNITY): Payer: Self-pay | Admitting: Cardiology

## 2018-07-04 DIAGNOSIS — I255 Ischemic cardiomyopathy: Secondary | ICD-10-CM

## 2018-07-04 DIAGNOSIS — E785 Hyperlipidemia, unspecified: Secondary | ICD-10-CM | POA: Diagnosis present

## 2018-07-04 DIAGNOSIS — I340 Nonrheumatic mitral (valve) insufficiency: Secondary | ICD-10-CM

## 2018-07-04 HISTORY — PX: TRANSTHORACIC ECHOCARDIOGRAM: SHX275

## 2018-07-04 LAB — CBC
HCT: 39 % (ref 36.0–46.0)
Hemoglobin: 12.2 g/dL (ref 12.0–15.0)
MCH: 26 pg (ref 26.0–34.0)
MCHC: 31.3 g/dL (ref 30.0–36.0)
MCV: 83.2 fL (ref 80.0–100.0)
Platelets: 348 10*3/uL (ref 150–400)
RBC: 4.69 MIL/uL (ref 3.87–5.11)
RDW: 14.1 % (ref 11.5–15.5)
WBC: 13.9 10*3/uL — ABNORMAL HIGH (ref 4.0–10.5)
nRBC: 0 % (ref 0.0–0.2)

## 2018-07-04 LAB — BASIC METABOLIC PANEL
Anion gap: 10 (ref 5–15)
BUN: 8 mg/dL (ref 6–20)
CALCIUM: 8.7 mg/dL — AB (ref 8.9–10.3)
CO2: 22 mmol/L (ref 22–32)
Chloride: 102 mmol/L (ref 98–111)
Creatinine, Ser: 0.69 mg/dL (ref 0.44–1.00)
GFR calc Af Amer: >60 mL/min (ref >60)
GFR calc non Af Amer: >60 mL/min (ref >60)
Glucose, Bld: 375 mg/dL — ABNORMAL HIGH (ref 70–99)
Potassium: 3.9 mmol/L (ref 3.5–5.1)
SODIUM: 134 mmol/L — AB (ref 135–145)

## 2018-07-04 LAB — GLUCOSE, CAPILLARY
GLUCOSE-CAPILLARY: 303 mg/dL — AB (ref 70–99)
Glucose-Capillary: 367 mg/dL — ABNORMAL HIGH (ref 70–99)
Glucose-Capillary: 133 mg/dL — ABNORMAL HIGH (ref 70–99)
Glucose-Capillary: 191 mg/dL — ABNORMAL HIGH (ref 70–99)
Glucose-Capillary: 310 mg/dL — ABNORMAL HIGH (ref 70–99)
Glucose-Capillary: 276 mg/dL — ABNORMAL HIGH (ref 70–99)

## 2018-07-04 LAB — TROPONIN I
Troponin I: 10.87 ng/mL (ref <0.03)
Troponin I: 12.97 ng/mL (ref <0.03)
Troponin I: 19.21 ng/mL (ref <0.03)
Troponin I: 23.53 ng/mL (ref <0.03)
Troponin I: 25.6 ng/mL (ref <0.03)

## 2018-07-04 LAB — HEMOGLOBIN A1C
Hgb A1c MFr Bld: 9.9 % — ABNORMAL HIGH (ref 4.8–5.6)
Mean Plasma Glucose: 237.43 mg/dL

## 2018-07-04 LAB — ECHOCARDIOGRAM COMPLETE
Height: 69 in
WEIGHTICAEL: 3680 [oz_av]

## 2018-07-04 MED ORDER — INSULIN ASPART 100 UNIT/ML ~~LOC~~ SOLN
0.0000 [IU] | SUBCUTANEOUS | Status: DC
  Administered 2018-07-04: 20 [IU] via SUBCUTANEOUS
  Administered 2018-07-04: 15 [IU] via SUBCUTANEOUS
  Administered 2018-07-04: 4 [IU] via SUBCUTANEOUS
  Administered 2018-07-04: 3 [IU] via SUBCUTANEOUS

## 2018-07-04 MED ORDER — LISINOPRIL 10 MG PO TABS
10.0000 mg | ORAL_TABLET | Freq: Every day | ORAL | Status: DC
  Administered 2018-07-04 – 2018-07-06 (×3): 10 mg via ORAL
  Filled 2018-07-04 (×3): qty 1

## 2018-07-04 MED ORDER — INSULIN ASPART 100 UNIT/ML ~~LOC~~ SOLN
0.0000 [IU] | Freq: Three times a day (TID) | SUBCUTANEOUS | Status: DC
  Administered 2018-07-04: 15 [IU] via SUBCUTANEOUS
  Administered 2018-07-05: 4 [IU] via SUBCUTANEOUS
  Administered 2018-07-05: 11 [IU] via SUBCUTANEOUS
  Administered 2018-07-05: 4 [IU] via SUBCUTANEOUS
  Administered 2018-07-06: 7 [IU] via SUBCUTANEOUS

## 2018-07-04 MED ORDER — ENOXAPARIN SODIUM 40 MG/0.4ML ~~LOC~~ SOLN
40.0000 mg | SUBCUTANEOUS | Status: DC
  Administered 2018-07-04 – 2018-07-05 (×2): 40 mg via SUBCUTANEOUS
  Filled 2018-07-04 (×2): qty 0.4

## 2018-07-04 MED ORDER — ATORVASTATIN CALCIUM 80 MG PO TABS
80.0000 mg | ORAL_TABLET | Freq: Every day | ORAL | Status: DC
  Administered 2018-07-04 – 2018-07-06 (×3): 80 mg via ORAL
  Filled 2018-07-04 (×3): qty 1

## 2018-07-04 MED ORDER — INSULIN ASPART 100 UNIT/ML ~~LOC~~ SOLN
6.0000 [IU] | Freq: Three times a day (TID) | SUBCUTANEOUS | Status: DC
  Administered 2018-07-04 – 2018-07-06 (×3): 6 [IU] via SUBCUTANEOUS

## 2018-07-04 MED ORDER — INSULIN ASPART 100 UNIT/ML ~~LOC~~ SOLN
0.0000 [IU] | Freq: Every day | SUBCUTANEOUS | Status: DC
  Administered 2018-07-04 – 2018-07-05 (×2): 3 [IU] via SUBCUTANEOUS

## 2018-07-04 MED ORDER — LIVING WELL WITH DIABETES BOOK
Freq: Once | Status: DC
  Filled 2018-07-04: qty 1

## 2018-07-04 MED ORDER — PERFLUTREN LIPID MICROSPHERE
1.0000 mL | INTRAVENOUS | Status: AC | PRN
  Administered 2018-07-04: 3 mL via INTRAVENOUS
  Filled 2018-07-04: qty 10

## 2018-07-04 MED ORDER — INSULIN GLARGINE 100 UNIT/ML ~~LOC~~ SOLN
10.0000 [IU] | Freq: Every day | SUBCUTANEOUS | Status: DC
  Administered 2018-07-04: 10 [IU] via SUBCUTANEOUS
  Filled 2018-07-04: qty 0.1

## 2018-07-04 NOTE — Consult Note (Signed)
Medical Consultation   Amy Chaney  LEX:[id number]  DOB: 01-Apr-1971  DOA: 07/03/2018  PCP: [name], MD   Outpatient Specialists: Ellyn Hack - cardiology    Requesting physician: Gwenlyn Found, cardiology  Reason for consultation: Diabetes management.  Admitted overnight for STEMI, had PCI placed.  Appears to be non-CPL, DM, HTN, HLD.  A1c about 10.   History of Present Illness: Amy Chaney is an 47 y.o. female with h/o DM; STEMI (2014; 3/19; and last night); HTN; HLD; and depression presenting with STEMI.  She came into the hospital with chest pain since last Thursday.  NTG would make it better and then it would recur, wasn't able to lay down night.  She is now "a whole lot better".  She hasn't checked her sugars in 3-4 months.  She was on metformin and so stopped it.  She was on insulin a couple of years ago and was lost to f/u and so has not taken it recently.  She has 2 other type 1 diabetics in her home and there weren't enough test strips to allow them all to test.  She lives with her sister, her sister's son, the patient's son.   Review of Systems:  ROS As per HPI otherwise 10 point review of systems negative.    Past Medical History: Past Medical History:  Diagnosis Date  . Coronary artery disease involving native coronary artery of native heart with angina pectoris ([place]) 12/09/2014   a. s/p PCI of the mid RCA 01/2013 //  b. LHC 9/14: EF 55%, RCA stent 100% occluded, LAD irregularities >>  subacute stent thrombosis, Promus DES PCI distal overlap // c) 09/2017 - Ant STEMI - LAD-D2 PCI - Successful PCI of LAD (3 overlapping DES), unable to restore flow down D2l that was stented as well.  Likely related to downstream dissection, but unable to rewire.  . Daily headache   . Depression   . Dyslipidemia, goal LDL below 70   . History of Doppler ultrasound    carotid bruit >> a. Carotid US 6/17: bilat ICA 1-39%  . Hypertension   . Prolonged Q-T interval on ECG  01/11/2016  . STEMI involving left anterior descending coronary artery ([place]) 09/2017   Severe [place] 1, 1, 1 LAD-D2 lesion (complicated by post PTCA dissection/intramural hematoma) -successful extensive PCI of the LAD but unable to maintain patency of the stented D2.   Marland Kitchen STEMI involving oth coronary artery of inferior wall ([place]) 2014   Secondary to subacute stent thrombosis of RCA stent segment having missed 4 doses of Effient.  . Tobacco abuse 01/11/2016  . Type II diabetes mellitus ([place]) 12/2010   sister and son also diabetic     Past Surgical History: Past Surgical History:  Procedure Laterality Date  . CHOLECYSTECTOMY  ~ 2000  . CORONARY STENT INTERVENTION N/A 10/10/2017   Procedure: CORONARY STENT INTERVENTION;  Surgeon: Leonie Man, MD;  Location: [facility];  Service: Cardiovascular; LAD PCI: STENT SYNERGY DES 3.5X32 (p),STENT SYNERGY DES 3.5X 8 (m), STENT SYNERGY DES 3.5X28 (d).  D2 PCI: STENT SYNERGY DES 2.5X24.  (UNABL TO REWIRE & EXPAND - TO OF DIAG AT END OF CASE)  . CORONARY STENT INTERVENTION N/A 07/03/2018   Procedure: CORONARY STENT INTERVENTION;  Surgeon: Leonie Man, MD;  Location: [facility];  Service: Cardiovascular;  Laterality: N/A;  . LEFT HEART CATH AND CORONARY ANGIOGRAPHY N/A 10/10/2017  Procedure: LEFT HEART CATH AND CORONARY ANGIOGRAPHY;  Surgeon: Leonie Man, MD;  Location: [facility];  Service: Cardiovascular;  Laterality: N/A; -patent RCA stents with mild in-stent restenosis. [place] 1,1,1 mLD-D2 24% (complicated by dissection/intramural thrombus)  . LEFT HEART CATH AND CORONARY ANGIOGRAPHY N/A 07/03/2018   Procedure: LEFT HEART CATH AND CORONARY ANGIOGRAPHY;  Surgeon: Leonie Man, MD;  Location: [facility];  Service: Cardiovascular;  Laterality: N/A;  . LEFT HEART CATHETERIZATION WITH CORONARY ANGIOGRAM N/A 02/25/2013   Procedure: LEFT HEART CATHETERIZATION WITH CORONARY ANGIOGRAM;  Surgeon: Laverda Page, MD;   Location: [facility] CATH LAB;  Service: Cardiovascular;; CTO m RCA; otherwise normal coronaries  . LEFT HEART CATHETERIZATION WITH CORONARY ANGIOGRAM N/A 04/01/2013   Procedure: LEFT HEART CATHETERIZATION WITH CORONARY ANGIOGRAM;  Surgeon: Laverda Page, MD;  Location: [facility] CATH LAB;  Service: Cardiovascular: 100% occlusion of distal RCA stent (subacute stent thrombosis -  secondary to stopping Effient).  Overlapping DES PCI  . NM MYOVIEW LTD  12/2015    EF 38%.  Hypertensive response to exercise (231/132 mmHg).  INTERMEDIATE RISK due to -diffuse hypokinesis and reduced EF.  No ischemia or infarction.  Marland Kitchen PERCUTANEOUS CORONARY STENT INTERVENTION (PCI-S)  04/01/2013   Procedure: PERCUTANEOUS CORONARY STENT INTERVENTION (PCI-S);  Surgeon: Laverda Page, MD;  Location: [facility] CATH LAB;  Service: Cardiovascular;; PCI of distal RCA stent subacute thrombosis: Promus Premier DES 3.0 mm x 20 mm.  Marland Kitchen PERCUTANEOUS CORONARY STENT INTERVENTION (PCI-S)  02/25/2013   Procedure: PERCUTANEOUS CORONARY STENT INTERVENTION (PCI-S);  Surgeon: Laverda Page, MD;  Location: [facility] CATH LAB;  RCA CTO PCI with overlapping Promus DES: 3.0 mm x 38 mm, 3.5 mm x 66mm  . TRANSTHORACIC ECHOCARDIOGRAM  12/2015   mild LVH, EF 55-60%, no RWMA, Gr 1 DD, mild MR  . TRANSTHORACIC ECHOCARDIOGRAM  09/2017   In setting of anterior STEMI:  EF 35% with mild concentric hypertrophy.  GRII DD.  Anteroseptal, anterior and anterolateral walls hypokinetic.  Moderate MR.  . TUBAL LIGATION  1994     Allergies:  No Known Allergies   Social History:  reports that she has been smoking cigarettes. She has a 7.50 pack-year smoking history. She has never used smokeless tobacco. She reports that she drinks alcohol. She reports that she has current or past drug history. Drug: Marijuana.   Family History: Family History  Problem Relation Age of Onset  . Diabetes Mother   . Diabetes Sister   . Diabetes Sister   . Diabetes Son        type 1  . Diabetes  Son        type 2      Physical Exam: Vitals:   07/04/18 1015 07/04/18 1030 07/04/18 1045 07/04/18 1100  BP: 117/61 116/61 121/67 104/63  Pulse: 86 69 87 68  Resp: 17 18 15 17   Temp:      TempSrc:      SpO2: 100% 100% 100% 100%  Weight:      Height:        Constitutional: Alert and awake, oriented x3, not in any acute distress. Eyes:  EOMI, irises appear normal, anicteric sclera,  ENMT: external ears and nose appear normal, normal hearing, Lips appear normal, oropharynx mucosa, tongue, posterior pharynx appear normal; she has very poor dentition, mostly edentulous  Neck: neck appears normal, no masses, normal ROM, no thyromegaly, no JVD  CVS: S1-S2 clear, no murmur rubs or gallops, no LE edema, normal pedal  pulses  Respiratory:  clear to auscultation bilaterally, no wheezing, rales or rhonchi. Respiratory effort normal. No accessory muscle use.  Abdomen: soft nontender, nondistended, normal bowel sounds, no hepatosplenomegaly, no hernias  Musculoskeletal: : no cyanosis, clubbing or edema noted bilaterally Neuro: Cranial nerves II-XII intact, strength, sensation, reflexes Psych: judgement and insight appear normal, stable mood and affect, mental status Skin: no rashes or lesions or ulcers, no induration or nodules    Data reviewed:  I have personally reviewed the recent labs and imaging studies  Pertinent Labs:   Glucose 375 Troponin 0.49, 28.03, 25.60, 23.53, 19.21 WBC 13.9 A1c 9.9  Inpatient Medications:   Scheduled Meds: . aspirin EC  81 mg Oral Daily  . atorvastatin  80 mg Oral Daily  . carvedilol  3.125 mg Oral BID  . enoxaparin (LOVENOX) injection  40 mg Subcutaneous Q24H  . insulin aspart  0-20 Units Subcutaneous Q4H  . lisinopril  10 mg Oral Daily  . sodium chloride flush  3 mL Intravenous Q12H  . ticagrelor  90 mg Oral BID   Continuous Infusions: . sodium chloride 10 mL/hr at 07/04/18 1100  . DOPamine Stopped (07/04/18 1009)     Radiological Exams on  Admission: Dg Chest Port 1 View  Result Date: 07/03/2018 CLINICAL DATA:  47 y/o  F; coronary stents today. EXAM: PORTABLE CHEST 1 VIEW COMPARISON:  01/06/2018 chest radiograph. FINDINGS: Stable cardiac silhouette given projection and technique. Pulmonary venous hypertension. Clear lungs. No pleural effusion or pneumothorax. No acute osseous abnormality is evident. IMPRESSION: Pulmonary venous hypertension. No focal consolidation. Electronically Signed   By: Kristine Garbe M.D.   On: 07/03/2018 21:14    Impression/Recommendations Principal Problem:   Acute ST elevation myocardial infarction (STEMI) involving left anterior descending (LAD) coronary artery (HCC) Active Problems:   Diabetes type 2, uncontrolled ([place])   Coronary artery disease involving native coronary artery of native heart with angina pectoris (HCC)   H/O noncompliance with medical treatment, presenting hazards to health   Morbid obesity ([place]): BMI ~36 with HTN, HLD, CAD   Hypertension, accelerated, with diastolic congestive heart failure, NYHA class 1 (HCC)   Tobacco abuse   Dyslipidemia, goal LDL below 70  STEMI with h/o CAD -Patient has had recurrent STEMI in the setting of chronically uncontrolled DM and HTN -Last night with 3rd STEMI, now POD #1 s/p PCI with DES -On ASA and Brilinta -Ongoing medication compliance is crucial to her long-term survival -Followed by cardiology -Ischemic CM with EF 30-35%; based on h/o non-CPL she is not realistically a LifeVest candidate, but may benefit from AICD  Uncontrolled DM -She acknowledges being off medications for months and not having taken insulin in years -With her A1c >9, she is likely to benefit from insulin  -If she can be controlled with basal insulin to a reasonable degree, she may not need to follow her blood glucose as closely at home -Diabetes coordinator has been consulted -Will start 10 units basal insulin for now with basal-bolus coverage as well as  resistant-scale SSI  HTN -Will defer to cardiology, but suggest resumption of Coreg and Lisinopril as soon as appropriate -Chlorthalidone is also reasonable for outpatient therapy, since her creatinine is still good -Patient should be encouraged to avoid all NSAIDs  HLD -Agree with atorvastatin 80 mg daily, previously on 40 - although likely not taking regularly  Obesity -Patient should be encouraged to lose weight -It seems unlikely that she will be highly motivated with diet/exercise -She is also not a  good bariatric surgery candidate at this time  Non-CPL -We had a long discussion about how she is going to have to make herself a priority -She has to go to appointments and take medications -She already has significant damage from uncontrolled DM and HTN and this will only get worse in the future if she does not start taking care of herself NOW. -She voices understanding.  Tobacco dependence -Cessation should be encouraged    Thank you for this consultation.  Our [facility] hospitalist team will follow the patient with you.   Time Spent: 50 minutes  Karmen Bongo M.D. [facility] 07/04/2018, 11:57 AM

## 2018-07-04 NOTE — Progress Notes (Signed)
  Echocardiogram 2D Echocardiogram with definity has been performed.  Darlina Sicilian M 07/04/2018, 1:58 PM

## 2018-07-04 NOTE — Progress Notes (Signed)
Spoke with patient about her diabetes. States that she was diagnosed in 2015. Has been seen at [facility]; states that she was last seen in June or July. States that she is taking Glucotrol and Metformin for her diabetes. Stated that she had been on insulin for a time in 2016 and used an insulin pen. Patient does not check blood sugars at home very often.  She lives with her sister and son who both have Type 1 diabetes. States that she fries all her food. Will have dietician to see patient with some meal planning ideas and encourage baked foods.  Reviewed the use of the insulin pen with her and she remembered how to manage it.   Will continue to monitor blood sugars while in the hospital. Will follow to see if patient is discharged on insulin. Living Well with Diabetes booklet ordered from pharmacy for patient.   Harvel Ricks RN BSN CDE Diabetes Coordinator Pager: [phone]  8am-5pm

## 2018-07-04 NOTE — Progress Notes (Addendum)
Inpatient Diabetes Program Recommendations  AACE/ADA: New Consensus Statement on Inpatient Glycemic Control (2015)  Target Ranges:  Prepandial:   less than 140 mg/dL      Peak postprandial:   less than 180 mg/dL (1-2 hours)      Critically ill patients:  140 - 180 mg/dL   Lab Results  Component Value Date   GLUCAP 133 (H) 07/04/2018   HGBA1C 9.9 (H) 07/04/2018    Review of Glycemic Control  Diabetes history: Type 2  Outpatient Diabetes medications: Glucotrol Current orders for Inpatient glycemic control: Novolog RESISTANT (0-20 units) every 4 hours  Inpatient Diabetes Program Recommendations:   Received diabetes coordinator consult. Recommend starting with Lantus 15 units daily (104.3 kg X 0.15 units/kg = 15.6 units) and continuing Novolog 0-20 units TID & HS. Titrate dosage as needed.   Patient does not have health insurance.  Has been seen at [facility] in the past. Will need to get insulin at the [facility] for $10 at discharge. Recommend Lantus Solostar insulin pens at discharge.  If patient cannot get appointment at [facility], patient may need to be discharged on Rel[employer] insulin which is the cheaper form of insulin. Will need to calculate dosage of 70/30 insulin if needed.   Harvel Ricks RN BSN CDE Diabetes Coordinator Pager: [phone]  8am-5pm

## 2018-07-04 NOTE — Care Management Note (Addendum)
Case Management Note  Patient Details  Name: Amy Chaney MRN: [id number] Date of Birth: [birth year]  Subjective/Objective: 47 yo female presented with CP, anterior STEMI; s/p cath with PCI.            Action/Plan: CM met with patient to discuss transitional needs. Patient lived at home with her family, independent with ADLs PTA. Patient confirmed PCP as: Dr. [name] ([facility]); pharmacy: [facility]. Patient stated she is working with [facility] in obtaining the [facility] or [employer], must supply her tax documents. Patient verbalized difficulty affording bus fare or care for her grandson, so her last PCP appointment at [facility] in July 2019 was cancelled. CM encouraged patient to f/u at [facility] as scheduled for her PCP and Rx needs, with patient verbalizing understanding. Brilinta 30-day free card provided; patient agreeable to [place] prior to discharge. CM team will follow for MATCH consideration and additional needs.   Expected Discharge Date:                  Expected Discharge Plan:  Home/Self Care  In-House Referral:  NA  Discharge planning Services  CM Consult, Medication Assistance, [facility](Brilinta card provided)  Post Acute Care Choice:  NA Choice offered to:  NA  DME Arranged:  N/A DME Agency:  NA  HH Arranged:  NA HH Agency:  NA  Status of Service:  In process, will continue to follow  If discussed at Long Length of Stay Meetings, dates discussed:    Additional Comments:  Midge Minium RN, BSN, NCM-BC, ACM-RN [phone] 07/04/2018, 12:40 PM

## 2018-07-04 NOTE — Progress Notes (Signed)
Patient's Blood pressure was 631-497 systolic throughout the night. Patient's Nitro drip was currently on 63mcg and blood pressure was maintaining between 026-378'H systolic. Around 2300, patient was bradycardic with a Blood pressure of 885'O systolic. Nitroglycerin drip was turned off, patient was assessed and had c/o of nausea and "feeling hot." Within 15 minutes HR dropped to the 40's, BP was 27'X systolic and patient had a change in LOC. 0.5mg  of Atropine was given and Cardiology along with CCM was paged. Orders were to give 250cc bolus of fluids and start on Dopamine drip at 45mcg. 12 Lead EKG was done, Defibrillator pads were put on the patient and zoll is in place. Patient's HR is now Normal Sinus and BP is 115/66 with a MAP of 82. Verbal orders were to decrease the Dopamine drip to 2.74mcg. Patient was assessed and she states "I feel so much better." Patient is now resting comfortably. Will continue to monitor for any changes.

## 2018-07-04 NOTE — Progress Notes (Signed)
CARDIAC REHAB PHASE I   Tried to begin MI education with pt. Pt kept her eyes closed and responded only with mumbled. MI book and stent card left at bedside. Will follow-up tomorrow.  [phone] Rufina Falco, RN BSN 07/04/2018 2:40 PM

## 2018-07-04 NOTE — Progress Notes (Signed)
..  EKG CRITICAL VALUE     12 lead EKG performed.  Critical value noted. Vanita Ingles, RN notified.   Ihor Gully, CCT 07/04/2018 6:54 AM

## 2018-07-04 NOTE — Progress Notes (Signed)
Progress Note  Patient Name: Amy Chaney Date of Encounter: 07/04/2018  Primary Cardiologist: Glenetta Hew, MD   Subjective   Postop day 1 anterior STEMI treated with PCI and drug-eluting stenting to occluded mid to distal LAD by Dr. Ellyn Hack in a previously instrumented vessel.  Continues to have chest pain.  Hemodynamically stable on low-dose dopamine which will be weaned.  Off Aggrastat and heparin.  Will get out of bed to chair today, normal post MI progression.  Inpatient Medications    Scheduled Meds: . aspirin EC  81 mg Oral Daily  . atorvastatin  40 mg Oral Daily  . carvedilol  3.125 mg Oral BID  . insulin aspart  0-20 Units Subcutaneous Q4H  . lisinopril  20 mg Oral Daily  . sodium chloride flush  3 mL Intravenous Q12H  . ticagrelor  90 mg Oral BID   Continuous Infusions: . sodium chloride    . DOPamine    . DOPamine 2.5 mcg/kg/min (07/03/18 2315)   PRN Meds: sodium chloride, acetaminophen, nitroGLYCERIN, sodium chloride flush   Vital Signs    Vitals:   07/04/18 0400 07/04/18 0500 07/04/18 0600 07/04/18 0700  BP: 127/71 132/71 115/62 119/71  Pulse: 87 83 88 91  Resp: 18 15 19 19   Temp: 98 F (36.7 C)     TempSrc: Oral     SpO2: 100% 100% 100% 100%  Weight:      Height:        Intake/Output Summary (Last 24 hours) at 07/04/2018 0755 Last data filed at 07/04/2018 0400 Gross per 24 hour  Intake 932.39 ml  Output 700 ml  Net 232.39 ml   Filed Weights   07/03/18 1730  Weight: 104.3 kg    Telemetry    Sinus rhythm with occasional junctional bradycardia.- Personally Reviewed  ECG    Sinus rhythm at 80 with LVH, septal Q waves and continued ST of elevation with biphasic T waves consistent with normal evolution of evolving anterior MI.- Personally Reviewed  Physical Exam   GEN: No acute distress.   Neck: No JVD Cardiac: RRR, no murmurs, rubs, or gallops.  Respiratory: Clear to auscultation bilaterally. GI: Soft, nontender, non-distended    MS: No edema; No deformity. Neuro:  Nonfocal  Psych: Normal affect  Extremities: Right radial puncture site stable  Labs    Chemistry Recent Labs  Lab 07/03/18 1732 07/03/18 1816 07/04/18 0312  NA 134* 136 134*  K 4.3 3.7 3.9  CL 100 101 102  CO2 25  --  22  GLUCOSE 336* 352* 375*  BUN 7 8 8   CREATININE 0.72 0.40* 0.69  CALCIUM 9.7  --  8.7*  GFRNONAA >60  --  >60  GFRAA >60  --  >60  ANIONGAP 9  --  10     Hematology Recent Labs  Lab 07/03/18 1732 07/03/18 1816 07/03/18 2333 07/04/18 0312  WBC 15.6*  --   --  13.9*  RBC 5.52*  --   --  4.69  HGB 14.0 13.3 11.4* 12.2  HCT 45.9 39.0 37.7 39.0  MCV 83.2  --   --  83.2  MCH 25.4*  --   --  26.0  MCHC 30.5  --   --  31.3  RDW 13.9  --   --  14.1  PLT 446*  --   --  348    Cardiac Enzymes Recent Labs  Lab 07/03/18 2051 07/03/18 2333 07/04/18 0312 07/04/18 0550  TROPONINI 28.03* 25.60* 23.53* 19.21*  Recent Labs  Lab 07/03/18 1753  TROPIPOC 0.49*     BNPNo results for input(s): BNP, PROBNP in the last 168 hours.   DDimer No results for input(s): DDIMER in the last 168 hours.   Radiology    Dg Chest Port 1 View  Result Date: 07/03/2018 CLINICAL DATA:  47 y/o  F; coronary stents today. EXAM: PORTABLE CHEST 1 VIEW COMPARISON:  01/06/2018 chest radiograph. FINDINGS: Stable cardiac silhouette given projection and technique. Pulmonary venous hypertension. Clear lungs. No pleural effusion or pneumothorax. No acute osseous abnormality is evident. IMPRESSION: Pulmonary venous hypertension. No focal consolidation. Electronically Signed   By: Kristine Garbe M.D.   On: 07/03/2018 21:14    Cardiac Studies   Cardiac catheterization/intervention (07/03/2018)  Conclusion     Previously placed Prox LAD to Mid LAD drug eluting stents are widely patent up until the distal edge..  Mid LAD lesion is 100% stenosed at the distal edge of the most distal stent.  A drug-eluting stent was successfully  placed using a STENT SYNERGY DES 3X12.  Post intervention, there is a 0% residual stenosis.  Balloon angioplasty was performed in the apical vessel just to help move distal embolization -allowing for flow down to the inferoapex.Colon Flattery 2nd Diag lesion is 100% stenosed. Known occluded stent from previous PCI  Prox RCA to Mid RCA stents are 5% stenosed.  Mid RCA to Dist RCA lesion is 40% stenosed.  Ost 2nd Mrg to 2nd Mrg lesion is 65% stenosed.  There is moderate to severe left ventricular systolic dysfunction.  LV end diastolic pressure is normal.  The left ventricular ejection fraction is 25-35% by visual estimate.  There is trivial (1+) mitral regurgitation.  There is no aortic valve stenosis.  Post intervention, there is a 0% residual stenosis.  Mid LAD lesion is 100% stenosed.   SUMMARY:  Distal stent edge 100% thrombosis (in the mid LAD) as culprit for anterior STEMI: Treated with PTCA and overlapping DES stent (synergy 3.0 mm x 12 mm postdilated to 3.6 mm in the overlap and 3.3 mm distally.  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  (EF roughly 30 to 35%)  RECOMMENDATION:  Admit to CVICU for ongoing care.  Will run Aggrastat for 3 hours post PCI.  Long-term dual endplate therapy.  Currently aspirin and Brilinta, had been on Effient, but did not afford.  Would be okay to use Effient, Brilinta or Plavix as long she will be on it.  Uninterrupted for 1 year, would be okay to stop Plavix for 3 months if on Effient or Brilinta, at 6 months if on Plavix.  Restart home medications including carvedilol, lisinopril, atorvastatin  Sliding scale insulin for now.  Check A1c.  Check 2D echo to better assess EF.  Would not anticipate fast track discharge based on anterior MI with reduced EF.  Case management or social work consultation to help  ensure that she is able to get her medications and not miss doses.  Needs lots of education.      Patient Profile     47 y.o. female mildly overweight African-American female admitted yesterday with anterior STEMI by Dr. Ellyn Hack.  She is had RCA and LAD intervention in the past.  Other problems include morbid obesity, diabetes out-of-control, hypertension and hyperlipidemia as well as medication noncompliance.  She had distal edge occlusion in her LAD and underwent radial access  LAD PCI and drug-eluting stenting with restoration of antegrade flow.  She did have some distal embolization and was placed on Aggrastat briefly.  Her troponins rose to approximately 30.  Her EKG is evolving.  She continues to have constant substernal chest pain.  Assessment & Plan    1: Anterior STEMI- postop day 1 anterior STEMI treated with PCI and drug-eluting stenting to the distal edge of previously stented vessel with distal embolization requiring distal intervention as well as adjuvant Aggrastat.  I suspect this was related to being off antiplatelet medications.  She is currently on aspirin and Brilinta.  Her EKG shows evolution of anterior MI.  2: Ischemic cardiomyopathy- EF in the 30 to 35% range with apical akinesia and anterior apical severe hypokinesia.  She is on beta-blocker, ACE inhibitor as well.  She appears euvolemic and currently is not on a diuretic.  Optimally, she would potentially be a candidate for LifeVest although I do not think that realistically she would be compliant with this.  2D echo is pending.  3: Type 2 diabetes- on metformin however off medications for last several months with hemoglobin A1c above 10.  She is clearly not controlled and will need intensive education.  We will get the diabetes coordinator to assist as well as social service  4: Hyperlipidemia- on atorvastatin 40, will increase to 80  5: Essential hypertension- currently on low-dose pressors because of hypotension.  We  will wean off her dopamine.  She is on lisinopril 20 mg a day which we will decrease to 10 mg a day given her relative hypotension and continue her beta-blocker.  6: Medication noncompliance- I suspect this is multifactorial from physical constraints as well as simple noncompliance.  She apparently does have the funds to smoke however.  We will have case management and social social service assist with medication assistance.  Out of bed to chair today.  Wean IV dopamine off following vital signs closely.  Potentially transfer to stepdown or telemetry tomorrow.  Anticipate discharge early next week.  For questions or updates, please contact [facility] Please consult [email or web address] for contact info under        Signed, Quay Burow, MD  07/04/2018, 7:55 AM

## 2018-07-05 DIAGNOSIS — E785 Hyperlipidemia, unspecified: Secondary | ICD-10-CM

## 2018-07-05 DIAGNOSIS — Z720 Tobacco use: Secondary | ICD-10-CM

## 2018-07-05 DIAGNOSIS — I5043 Acute on chronic combined systolic (congestive) and diastolic (congestive) heart failure: Secondary | ICD-10-CM

## 2018-07-05 LAB — CBC
HCT: 38 % (ref 36.0–46.0)
Hemoglobin: 11.8 g/dL — ABNORMAL LOW (ref 12.0–15.0)
MCH: 26 pg (ref 26.0–34.0)
MCHC: 31.1 g/dL (ref 30.0–36.0)
MCV: 83.7 fL (ref 80.0–100.0)
Platelets: 303 10*3/uL (ref 150–400)
RBC: 4.54 MIL/uL (ref 3.87–5.11)
RDW: 14.2 % (ref 11.5–15.5)
WBC: 14 10*3/uL — ABNORMAL HIGH (ref 4.0–10.5)
nRBC: 0 % (ref 0.0–0.2)

## 2018-07-05 LAB — BASIC METABOLIC PANEL
Anion gap: 11 (ref 5–15)
BUN: 12 mg/dL (ref 6–20)
CO2: 22 mmol/L (ref 22–32)
Calcium: 8.7 mg/dL — ABNORMAL LOW (ref 8.9–10.3)
Chloride: 104 mmol/L (ref 98–111)
Creatinine, Ser: 0.74 mg/dL (ref 0.44–1.00)
GFR calc Af Amer: >60 mL/min (ref >60)
GFR calc non Af Amer: >60 mL/min (ref >60)
Glucose, Bld: 217 mg/dL — ABNORMAL HIGH (ref 70–99)
Potassium: 3.5 mmol/L (ref 3.5–5.1)
Sodium: 137 mmol/L (ref 135–145)

## 2018-07-05 LAB — LIPID PANEL
CHOLESTEROL: 164 mg/dL (ref 0–200)
HDL: 39 mg/dL — ABNORMAL LOW (ref >40)
LDL Cholesterol: 110 mg/dL — ABNORMAL HIGH (ref 0–99)
Total CHOL/HDL Ratio: 4.2 RATIO
Triglycerides: 73 mg/dL (ref <150)
VLDL: 15 mg/dL (ref 0–40)

## 2018-07-05 LAB — GLUCOSE, CAPILLARY
Glucose-Capillary: 281 mg/dL — ABNORMAL HIGH (ref 70–99)
Glucose-Capillary: 167 mg/dL — ABNORMAL HIGH (ref 70–99)
Glucose-Capillary: 171 mg/dL — ABNORMAL HIGH (ref 70–99)
Glucose-Capillary: 297 mg/dL — ABNORMAL HIGH (ref 70–99)

## 2018-07-05 LAB — TROPONIN I: TROPONIN I: 12.16 ng/mL — AB (ref <0.03)

## 2018-07-05 MED ORDER — CARVEDILOL 3.125 MG PO TABS: 3.1250 mg | ORAL_TABLET | Freq: Two times a day (BID) | ORAL | 2 refills | Status: DC

## 2018-07-05 MED ORDER — LISINOPRIL 10 MG PO TABS: 10.0000 mg | ORAL_TABLET | Freq: Every day | ORAL | 2 refills | Status: DC

## 2018-07-05 MED ORDER — INSULIN GLARGINE 100 UNIT/ML SOLOSTAR PEN: 15.0000 [IU] | PEN_INJECTOR | Freq: Every day | SUBCUTANEOUS | 0 refills | Status: DC

## 2018-07-05 MED ORDER — TICAGRELOR 90 MG PO TABS: 90.0000 mg | ORAL_TABLET | Freq: Two times a day (BID) | ORAL | 11 refills | Status: DC

## 2018-07-05 MED ORDER — ATORVASTATIN CALCIUM 80 MG PO TABS: 80.0000 mg | ORAL_TABLET | Freq: Every day | ORAL | 2 refills | Status: DC

## 2018-07-05 MED ORDER — INSULIN GLARGINE 100 UNIT/ML ~~LOC~~ SOLN
15.0000 [IU] | Freq: Every day | SUBCUTANEOUS | Status: DC
  Administered 2018-07-05: 15 [IU] via SUBCUTANEOUS
  Filled 2018-07-05: qty 0.15

## 2018-07-05 MED FILL — CARVEDILOL 3.125 MG TABLET: 3.125 | 30 days supply | Qty: 60 | Fill #0

## 2018-07-05 MED FILL — BRILINTA 90 MG TABLET: 90 | 30 days supply | Qty: 60 | Fill #0

## 2018-07-05 MED FILL — PENTIPS 31G X 8 MM MISC: 31G X 8 MM | 30 days supply | Qty: 100 | Fill #0

## 2018-07-05 MED FILL — LANTUS SOLOSTAR 100 UNITS/M: 100 | 28 days supply | Qty: 15 | Fill #0

## 2018-07-05 MED FILL — ATORVASTATIN CALCIUM 80 MG: 80 | 30 days supply | Qty: 30 | Fill #0

## 2018-07-05 MED FILL — LISINOPRIL 10 MG TABS: 10 | 30 days supply | Qty: 30 | Fill #0

## 2018-07-05 NOTE — Progress Notes (Signed)
CARDIAC REHAB PHASE I   PRE:  Rate/Rhythm: 68 SR  BP:  Supine:   Sitting: 127/66  Standing:    SaO2: 100%RA  MODE:  Ambulation: 420 ft   POST:  Rate/Rhythm: 86 SR  BP:  Supine:   Sitting: 125/67  Standing:    SaO2: 100%RA 0905-1014 Pt walked 420 ft on RA with steady gait. Tolerate well. No CP. To recliner after walk. MI education completed with pt. Reviewed importance of brilinta with stent. Reviewed NTG use, heart healthy food choices and watching carbs, ex ed, CRP 2, smoking cessation, and importance of taking meds. Referred to [place] CRP 2 but pt has no transportation and limited funds. Pt stated she does not work and is trying to get disability. She relies on her sister for a place to stay and her sister provides most of food on very small amount so pt has to cook and eat what is afforded. Pt knows what to do but she is limited right now with not having job, car doesn't work and her son lives with them with a two year old. Pt stated she is supposed to watch the two year old which is not able to do at this time.  Emotional support given. Pt stated she has quit smoking. Needs to see case manager to make sure discharge plans are in place as pt is for tentative discharge tomorrow.   Graylon Good, RN BSN  07/05/2018 10:08 AM

## 2018-07-05 NOTE — Progress Notes (Signed)
Consult progress note                                                                               Patient Demographics  Amy Chaney, is a 47 y.o. female, DOB - 03-Jul-1971, KJZ:[id number]  Admit date - 07/03/2018   Admitting Physician Leonie Man, MD  Outpatient Primary MD for the patient is [name], MD  Outpatient specialists:   LOS - 2  days   Medical records reviewed and are as summarized below:    Chief Complaint  Patient presents with  . Chest Pain  . Shortness of Breath  . Nausea       Brief summary   Amy Chaney is an 47 y.o. female with h/o DM; STEMI (2014; 3/19; and last night); HTN; HLD; and depression presenting with STEMI.  She came into the hospital with chest pain since last Thursday.  NTG would make it better and then it would recur, wasn't able to lay down night.  She is now "a whole lot better".  She hasn't checked her sugars in 3-4 months.  She was on metformin, but had GI upset and so stopped it.  She was on insulin a couple of years ago and was lost to f/u and so has not taken it recently.  She has 2 other type 1 diabetics in her home and there weren't enough test strips to allow them all to test.  She lives with her sister, her sister's son, the patient's son.   Assessment & Plan    Principal Problem:   Acute ST elevation myocardial infarction (STEMI) involving left anterior descending (LAD) coronary  artery ([place]), possibly due to noncompliance, poor glycemic control, with underlying history of CAD -Per primary team, cardiology, status post PCI with DES in mid LAD  Active Problems:   Diabetes type 2, uncontrolled ([place]) -Hemoglobin A1c 9.9, reports that she had a discontinued metformin due to side effects, was on insulin few years ago -Has not been checking her blood sugars and currently on no hypoglycemics -Patient started on Lantus, meal coverage, sliding scale insulin while inpatient -CBGs in 200s still, increase Lantus to  15 units at bedtime, continue meal coverage, sliding scale insulin -Discussed at length with the patient, she will need to check her blood sugars q. before meals and at bedtime, ideally will need long-acting basal insulin with meal coverage but likely due to noncompliance she may not adhere.  She might do better on 70/30 insulin due to cost and BID dosing, will await diabetic coordinator to address with the patient and the dosing. -Patient acknowledges that she will need close follow-ups with PCP for further adjustments in the insulin regimen    H/O noncompliance with medical treatment, presenting hazards to health -Counseled strongly on compliance, regular follow-ups, glycemic control    Morbid obesity ([place]): BMI ~36 with HTN, HLD, CAD -Recommended diet and weight control    Hypertension, accelerated, with diastolic congestive heart failure, NYHA class 1 ([place]), hyperlipidemia -will defer to cardiology team    Tobacco abuse -Counseled strongly on nicotine cessation    Code Status: Full code DVT Prophylaxis:  Lovenox Family Communication:  Discussed in detail with the patient, all imaging results, lab results explained to the patient    Disposition Plan: Per cardiology, DC home in a.m.  Time Spent in minutes 25 minutes  Procedures:  Cardiac cath/PCI on 12/4   Medications  Scheduled Meds: . aspirin EC  81 mg Oral Daily  . atorvastatin  80 mg Oral Daily  . carvedilol  3.125 mg Oral BID  . enoxaparin (LOVENOX) injection  40 mg Subcutaneous Q24H  . insulin aspart  0-20 Units Subcutaneous TID WC  . insulin aspart  0-5 Units Subcutaneous QHS  . insulin aspart  6 Units Subcutaneous TID WC  . insulin glargine  15 Units Subcutaneous QHS  . lisinopril  10 mg Oral Daily  . living well with diabetes book   Does not apply Once  . sodium chloride flush  3 mL Intravenous Q12H  . ticagrelor  90 mg Oral BID   Continuous Infusions: . sodium chloride Stopped (07/04/18 1145)   PRN  Meds:.sodium chloride, acetaminophen, nitroGLYCERIN, sodium chloride flush   Antibiotics   Anti-infectives (From admission, onward)   None        Subjective:   Amy Chaney was seen and examined today.  Currently no chest pain, ambulating in the room.  No shortness of breath.  Patient denies abdominal pain, N/V/D/C, new weakness, numbess, tingling. No acute events overnight.    Objective:   Vitals:   07/04/18 2200 07/04/18 2300 07/05/18 0418 07/05/18 0813  BP: 130/70 139/63 (!) 89/56 109/71  Pulse: 65 70 75 66  Resp: 19 17 17 18   Temp:  98.2 F (36.8 C) 98.2 F (36.8 C) 98.2 F (36.8 C)  TempSrc:  Oral Oral Oral  SpO2: 100% 100% 100% 100%  Weight:  103.7 kg    Height:        Intake/Output Summary (Last 24 hours) at 07/05/2018 1051 Last data filed at 07/05/2018 0900 Gross per 24 hour  Intake 438.08 ml  Output 200 ml  Net 238.08 ml     Wt Readings from Last 3 Encounters:  07/04/18 103.7 kg  04/02/18 105.1 kg  01/06/18 104.3 kg     Exam  General: Alert and oriented x 3, NAD  Eyes:   HEENT:  Atraumatic, normocephalic, normal oropharynx  Cardiovascular: S1 S2 auscultated, Regular rate and rhythm.  Respiratory: Clear to auscultation bilaterally, no wheezing, rales or rhonchi  Gastrointestinal: Soft, nontender, nondistended, + bowel sounds  Ext: no pedal edema bilaterally  Neuro: No new deficits  Musculoskeletal: No digital cyanosis, clubbing  Skin: No rashes  Psych: Normal affect and demeanor, alert and oriented x3    Data Reviewed:  I have personally reviewed following labs and imaging studies  Micro Results Recent Results (from the past 240 hour(s))  MRSA PCR Screening     Status: None   Collection Time: 07/03/18  8:25 PM  Result Value Ref Range Status   MRSA by PCR NEGATIVE NEGATIVE Final    Comment:        The GeneXpert MRSA Assay (FDA approved for NASAL specimens only), is one component of a comprehensive MRSA  colonization surveillance program. It is not intended to diagnose MRSA infection nor to guide or monitor treatment for MRSA infections. Performed at [facility], [place] [address], [place], [place] [address]     Radiology Reports Dg Chest [place] 1 View  Result Date: 07/03/2018 CLINICAL DATA:  47 y/o  F; coronary stents today. EXAM: PORTABLE CHEST 1 VIEW COMPARISON:  01/06/2018  chest radiograph. FINDINGS: Stable cardiac silhouette given projection and technique. Pulmonary venous hypertension. Clear lungs. No pleural effusion or pneumothorax. No acute osseous abnormality is evident. IMPRESSION: Pulmonary venous hypertension. No focal consolidation. Electronically Signed   By: Kristine Garbe M.D.   On: 07/03/2018 21:14    Lab Data:  CBC: Recent Labs  Lab 07/03/18 1732 07/03/18 1816 07/03/18 2333 07/04/18 0312 07/05/18 0330  WBC 15.6*  --   --  13.9* 14.0*  HGB 14.0 13.3 11.4* 12.2 11.8*  HCT 45.9 39.0 37.7 39.0 38.0  MCV 83.2  --   --  83.2 83.7  PLT 446*  --   --  348 622   Basic Metabolic Panel: Recent Labs  Lab 07/03/18 1732 07/03/18 1816 07/04/18 0312 07/05/18 0330  NA 134* 136 134* 137  K 4.3 3.7 3.9 3.5  CL 100 101 102 104  CO2 25  --  22 22  GLUCOSE 336* 352* 375* 217*  BUN 7 8 8 12   CREATININE 0.72 0.40* 0.69 0.74  CALCIUM 9.7  --  8.7* 8.7*   GFR: Estimated Creatinine Clearance: 110.2 mL/min (by C-G formula based on SCr of 0.74 mg/dL). Liver Function Tests: No results for input(s): AST, ALT, ALKPHOS, BILITOT, PROT, ALBUMIN in the last 168 hours. No results for input(s): LIPASE, AMYLASE in the last 168 hours. No results for input(s): AMMONIA in the last 168 hours. Coagulation Profile: No results for input(s): INR, PROTIME in the last 168 hours. Cardiac Enzymes: Recent Labs  Lab 07/04/18 0312 07/04/18 0550 07/04/18 1259 07/04/18 2136 07/05/18 0019  TROPONINI 23.53* 19.21* 12.97* 10.87* 12.16*   BNP (last 3 results) No results for  input(s): PROBNP in the last 8760 hours. HbA1C: Recent Labs    07/04/18 0312  HGBA1C 9.9*   CBG: Recent Labs  Lab 07/04/18 0802 07/04/18 1141 07/04/18 1519 07/04/18 2151 07/05/18 0611  GLUCAP 133* 191* 310* 276* 281*   Lipid Profile: Recent Labs    07/05/18 0330  CHOL 164  HDL 39*  LDLCALC 110*  TRIG 73  CHOLHDL 4.2   Thyroid Function Tests: No results for input(s): TSH, T4TOTAL, FREET4, T3FREE, THYROIDAB in the last 72 hours. Anemia Panel: No results for input(s): VITAMINB12, FOLATE, FERRITIN, TIBC, IRON, RETICCTPCT in the last 72 hours. Urine analysis:    Component Value Date/Time   BILIRUBINUR neg 03/03/2015 1040   PROTEINUR neg 03/03/2015 1040   UROBILINOGEN 1.0 03/03/2015 1040   NITRITE neg 03/03/2015 1040   LEUKOCYTESUR moderate (2+) (A) 03/03/2015 1040     Amy Chaney M.D. [facility] 07/05/2018, 10:51 AM  Pager: [phone] Between 7am to 7pm - call Pager - [phone]  After 7pm go to [email or web address] - password [email or web address]  Call night coverage person covering after 7pm

## 2018-07-05 NOTE — Progress Notes (Addendum)
Progress Note  Patient Name: Amy Chaney Date of Encounter: 07/05/2018  Primary Cardiologist: Glenetta Hew, MD   Subjective   No significant overnight events. Patient denies any chest pain or shortness of breath. Last episode of chest pain was 2 nights ago. Patient has no complaints at this time.  Inpatient Medications    Scheduled Meds: . aspirin EC  81 mg Oral Daily  . atorvastatin  80 mg Oral Daily  . carvedilol  3.125 mg Oral BID  . enoxaparin (LOVENOX) injection  40 mg Subcutaneous Q24H  . insulin aspart  0-20 Units Subcutaneous TID WC  . insulin aspart  0-5 Units Subcutaneous QHS  . insulin aspart  6 Units Subcutaneous TID WC  . insulin glargine  15 Units Subcutaneous QHS  . lisinopril  10 mg Oral Daily  . living well with diabetes book   Does not apply Once  . sodium chloride flush  3 mL Intravenous Q12H  . ticagrelor  90 mg Oral BID   Continuous Infusions: . sodium chloride Stopped (07/04/18 1145)   PRN Meds: sodium chloride, acetaminophen, nitroGLYCERIN, sodium chloride flush   Vital Signs    Vitals:   07/04/18 2200 07/04/18 2300 07/05/18 0418 07/05/18 0813  BP: 130/70 139/63 (!) 89/56 109/71  Pulse: 65 70 75 66  Resp: 19 17 17 18   Temp:  98.2 F (36.8 C) 98.2 F (36.8 C) 98.2 F (36.8 C)  TempSrc:  Oral Oral Oral  SpO2: 100% 100% 100% 100%  Weight:  103.7 kg    Height:        Intake/Output Summary (Last 24 hours) at 07/05/2018 0817 Last data filed at 07/04/2018 2000 Gross per 24 hour  Intake 343.94 ml  Output 350 ml  Net -6.06 ml   Filed Weights   07/03/18 1730 07/04/18 2300  Weight: 104.3 kg 103.7 kg    Telemetry    Sinus rhythm with rate between high 50's and 70's.  - Personally Reviewed  Physical Exam   GEN: 47 year old African American female resting comfortably. Alert and in no acute distress.   Neck: Supple. Cardiac: RRR. No murmurs, rubs, or gallops. Right radial cath site soft with no induration or signs of  hematoma. Respiratory: Clear to auscultation bilaterally. No wheezes, rhonchi, or rales. GI: Abdomen soft, non-tender, non-distended. Bowel sounds present.  MS: No lower extremity edema. Radial and distal pedal pulses 2+ and equal bilaterally. Neuro:  No focal deficits. Psych: Normal affect.   Labs    Chemistry Recent Labs  Lab 07/03/18 1732 07/03/18 1816 07/04/18 0312 07/05/18 0330  NA 134* 136 134* 137  K 4.3 3.7 3.9 3.5  CL 100 101 102 104  CO2 25  --  22 22  GLUCOSE 336* 352* 375* 217*  BUN 7 8 8 12   CREATININE 0.72 0.40* 0.69 0.74  CALCIUM 9.7  --  8.7* 8.7*  GFRNONAA >60  --  >60 >60  GFRAA >60  --  >60 >60  ANIONGAP 9  --  10 11     Hematology Recent Labs  Lab 07/03/18 1732  07/03/18 2333 07/04/18 0312 07/05/18 0330  WBC 15.6*  --   --  13.9* 14.0*  RBC 5.52*  --   --  4.69 4.54  HGB 14.0   < > 11.4* 12.2 11.8*  HCT 45.9   < > 37.7 39.0 38.0  MCV 83.2  --   --  83.2 83.7  MCH 25.4*  --   --  26.0  26.0  MCHC 30.5  --   --  31.3 31.1  RDW 13.9  --   --  14.1 14.2  PLT 446*  --   --  348 303   < > = values in this interval not displayed.    Cardiac Enzymes Recent Labs  Lab 07/04/18 0550 07/04/18 1259 07/04/18 2136 07/05/18 0019  TROPONINI 19.21* 12.97* 10.87* 12.16*    Recent Labs  Lab 07/03/18 1753  TROPIPOC 0.49*     BNPNo results for input(s): BNP, PROBNP in the last 168 hours.   DDimer No results for input(s): DDIMER in the last 168 hours.   Radiology    Dg Chest Port 1 View  Result Date: 07/03/2018 CLINICAL DATA:  47 y/o  F; coronary stents today. EXAM: PORTABLE CHEST 1 VIEW COMPARISON:  01/06/2018 chest radiograph. FINDINGS: Stable cardiac silhouette given projection and technique. Pulmonary venous hypertension. Clear lungs. No pleural effusion or pneumothorax. No acute osseous abnormality is evident. IMPRESSION: Pulmonary venous hypertension. No focal consolidation. Electronically Signed   By: Kristine Garbe M.D.   On:  07/03/2018 21:14    Cardiac Studies   Left Cardiac Catheterization 07/03/2018:  Previously placed Prox LAD to Mid LAD drug eluting stents are widely patent up until the distal edge..  Mid LAD lesion is 100% stenosed at the distal edge of the most distal stent.  A drug-eluting stent was successfully placed using a STENT SYNERGY DES 3X12.  Post intervention, there is a 0% residual stenosis.  Balloon angioplasty was performed in the apical vessel just to help move distal embolization -allowing for flow down to the inferoapex.Colon Flattery 2nd Diag lesion is 100% stenosed. Known occluded stent from previous PCI  Prox RCA to Mid RCA stents are 5% stenosed.  Mid RCA to Dist RCA lesion is 40% stenosed.  Ost 2nd Mrg to 2nd Mrg lesion is 65% stenosed.  There is moderate to severe left ventricular systolic dysfunction.  LV end diastolic pressure is normal.  The left ventricular ejection fraction is 25-35% by visual estimate.  There is trivial (1+) mitral regurgitation.  There is no aortic valve stenosis.  Post intervention, there is a 0% residual stenosis.  Mid LAD lesion is 100% stenosed.   Summary:  Distal stent edge 100% thrombosis (in the mid LAD) as culprit for anterior STEMI: Treated with PTCA and overlapping DES stent (synergy 3.0 mm x 12 mm postdilated to 3.6 mm in the overlap and 3.3 mm distally.  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  (EF roughly 30 to 35%)  Recommendation:  Admit to CVICU for ongoing care.  Will run Aggrastat for 3 hours post PCI.  Long-term dual endplate therapy.  Currently aspirin and Brilinta, had been on Effient, but did not afford.  Would be okay to use Effient, Brilinta or Plavix as long she will be on it.  Uninterrupted for 1 year, would be okay to stop Plavix for 3 months if on Effient or Brilinta, at 6 months if  on Plavix.  Restart home medications including carvedilol, lisinopril, atorvastatin  Sliding scale insulin for now.  Check A1c.  Check 2D echo to better assess EF.  Would not anticipate fast track discharge based on anterior MI with reduced EF.  Case management or social work consultation to help ensure that she is able to get her medications and not miss doses.  Needs lots of  education. _______________  Echocardiogram 07/04/2018: Study Conclusions: - Left ventricle: The cavity size was normal. There was mild   concentric hypertrophy. Systolic function was moderately reduced.   The estimated ejection fraction was in the range of 35% to 40%.   Hypokinesis of all of the apical segments. Features are   consistent with a pseudonormal left ventricular filling pattern,   with concomitant abnormal relaxation and increased filling   pressure (grade 2 diastolic dysfunction). Doppler parameters are   consistent with elevated ventricular end-diastolic filling   pressure. - Mitral valve: There was mild regurgitation. - Left atrium: The atrium was mildly dilated. - Right ventricle: Systolic function was normal. - Tricuspid valve: There was trivial regurgitation. - Pulmonary arteries: Systolic pressure was within the normal   range. - Inferior vena cava: The vessel was normal in size. - Pericardium, extracardiac: There was no pericardial effusion.   There was a left pleural effusion.  Impressions: - Since the last study on 10/11/2017 LVEF has improved from 30% to   35-40% with residual hypokinesis in the apical segments. There is   no thrombus on the Definity ecchocontrast images.  Patient Profile     Amy Chaney is a 48 year old African-American female admitted on 07/03/2018 with anterior STEMI by Dr. Ellyn Hack. She has had RCA and LAD intervention in the past. Other problems include morbid obesity, uncontrolled diabetes, hypertension and hyperlipidemia as well as medication noncompliance.  Troponin peaked at 28.03. A left heart catheterization was performed on 07/03/2018 which showed distal edge occlusion in her LAD . She underwent radial access LAD PCI and drug-eluting stenting with restoration of antegrade flow. She did have some distal embolization and was placed on Aggrastat briefly. Her EKG is evolving.   Assessment & Plan    Anterior STEMI - Patient is postop day 2 for anterior STEMI treated with PCI/DES to distal edge occlusion in LAD with distal embolization requiring Aggrastat briefly. - Patient denies any more angina. - Continue dual antiplatelet therapy with Aspirin 81mg  daily and Brilinta 90mg  twice daily.  - Continue high-intensity statin, beta-blocker, and ACEi.  Ischemic Cardiomyopathy - Echo showed LVEF of 35-40% with hypokinesis of all the apical segments and grade 2 diastolic dysfunction. - Patient appears euvolemic and is not currently on a diuretic.  - Continue Coreg 3.125mg  twice daily and Lisinopril 10mg  daily.  Hypertension - Patient was on low-dose pressors for hypotension. Dopamine was weaned off yesterday.  - Most recent BP 109/71. - Continue Coreg and Lisinopril as above as BP allows.   Hyperlipidemia - LDL 110 this admission. Goal <70 given CAD. - Continue Lipitor 80mg  daily.   Type 2 Diabetes Mellitus  - Previously on Metformin; however, patient has been off medications for the last several months.  - Hgb A1c 9.9 this admission. - Diabetes Coordinator and Care Management consulted yesterday.   Medication Noncompliance - Likely multifactorial.  - We have asked Case Management to assist with medication assistance.   For questions or updates, please contact [place] Please consult [email or web address] for contact info under        Signed, Darreld Mclean, PA-C  07/05/2018, 8:17 AM     Agree with note by Sande Rives, PA-C  Postop day 2 anterior STEMI secondary to acute stent thrombosis most likely from medication noncompliance.   Dr. Ellyn Hack performed radial intervention with stenting of the distal edge of the previously placed LAD stents.  There was some distal embolization which he angioplastied and placed the patient on Aggrastat  briefly.  She is had no recurrent chest pain.  She does have moderate LV dysfunction with an EF in the 35 to 40% range.  Her peak troponin was in the 12 range.  Her diabetes is out of control.  Her exam is benign.  She is ambulating without limitation or symptoms.  Plan is for discharge home tomorrow.  She has seen the diabetes coordinator, the case manager and social worker for medication assistance.  Medication compliance will be imperative.  Lorretta Harp, M.D., [place], [facility], [place] [address] [place], [place]  [id number]  [phone] 07/05/2018 9:31 AM

## 2018-07-05 NOTE — Progress Notes (Signed)
Spoke with patient about being discharged on insulin.  States that she will call [facility] today to get an appointment for next week.  According to the case manager, the letter has been sent to [facility] pharmacy for filling her prescriptions for discharge with a [facility] letter. So, Lantus (Solostar insulin pen and needles)  and Glucotrol would be the best medications for her discharge until she can get to the [facility].  She would only be willing to take once a day with Lantus or she would do 70/30 insulin twice a day, but would need to eat regularly with 70/30 insulin. States that Metformin upsets her stomach. She would not want to take insulin 3-4 times per day.  She can get a home blood glucose meter, strips, and lancets at [facility] with a written prescription. (order # [id number])  Harvel Ricks RN BSN CDE Diabetes Coordinator Pager: [phone]  8am-5pm

## 2018-07-05 NOTE — Progress Notes (Signed)
CSW acknowledging consult for "no money for RX". Inappropriate consult; CSW unable to assist patient with affording medications. RNCM already aware and assisting patient.  CSW signing off.  Laveda Abbe, [place] Clinical Social Worker [phone]

## 2018-07-05 NOTE — Progress Notes (Signed)
Nutrition Consult/Education Note  RD consulted for nutrition education regarding diabetes.   Lab Results  Component Value Date   HGBA1C 9.9 (H) 07/04/2018    RD provided "Carbohydrate Counting for People with Diabetes" handout from the Academy of Nutrition and Dietetics. Discussed different food groups and their effects on blood sugar, emphasizing carbohydrate-containing foods. Provided list of carbohydrates and recommended serving sizes of common foods.  Discussed importance of controlled and consistent carbohydrate intake throughout the day. Provided examples of ways to balance meals/snacks and encouraged intake of high-fiber, whole grain complex carbohydrates. Teach back method used.  Body mass index is 33.76 kg/m. Pt meets criteria for Obesity Class I based on current BMI.  Current diet order is HH/Carbohydrate Modified, patient is consuming approximately 100% of meals at this time. Labs and medications reviewed.   No further nutrition interventions warranted at this time. If additional nutrition issues arise, please re-consult RD.  Arthur Holms, RD, LDN Pager #: [phone] After-Hours Pager #: [phone]

## 2018-07-05 NOTE — Care Management Note (Signed)
Case Management Note Initial CM note completed by Amy Minium RN, BSN, NCM-BC, ACM-RN [phone] 07/04/2018, 12:40 PM   Patient Details  Name: Amy Chaney MRN: [id number] Date of Birth: 09/08/1970  Subjective/Objective: 47 yo female presented with CP, anterior STEMI; s/p cath with PCI.            Action/Plan: CM met with patient to discuss transitional needs. Patient lived at home with her family, independent with ADLs PTA. Patient confirmed PCP as: Dr. [name] ([facility]); pharmacy: [facility]. Patient stated she is working with [facility] in obtaining the [facility] or [employer], must supply her tax documents. Patient verbalized difficulty affording bus fare or care for her grandson, so her last PCP appointment at [facility] in July 2019 was cancelled. CM encouraged patient to f/u at [facility] as scheduled for her PCP and Rx needs, with patient verbalizing understanding. Brilinta 30-day free card provided; patient agreeable to [facility] prior to discharge. CM team will follow for MATCH consideration and additional needs.   Expected Discharge Date:                  Expected Discharge Plan:  Home/Self Care  In-House Referral:  NA  Discharge planning Services  CM Consult, Medication Assistance, [facility], [facility], Follow-up appt scheduled(Brilinta card provided)  Post Acute Care Choice:  NA Choice offered to:  NA  DME Arranged:  N/A DME Agency:  NA  HH Arranged: NA HH Agency:  NA  Status of Service:  In process, will continue to follow  If discussed at Long Length of Stay Meetings, dates discussed:    Discharge Disposition: home/self care   Additional Comments:  07/05/18- 1330- Marvetta Gibbons RN, CM-  Spoke with pt at bedside regarding transition of care needs- pt has called [facility] for f/u appointment and made it for 07/25/18 at 2:30pm. Pt voices understanding of importance of f/u and to f/u on medication assistance with the pharmacy at the [facility]. Pt will be assisted with  medication via [facility] prior to discharge with medication delivered today to bedside- pt voices concern over cost for meds- pt is eligible for MATCH assistance and CM will override copay cost- have explained MATCH program to pt and use once in 12 mo period- pt voices understanding and appreciation for assistance. [facility] letter placed on shadow chart for TOC. Pt provided application for Brilinta assistance to take with her for f/u at [facility].   Dawayne Patricia, RN 07/05/2018, 1:43 PM Transitions of Care Unit 4E- RN Case Manager [phone]

## 2018-07-05 NOTE — Care Management (Addendum)
ED CM received call to assist with override MATCH medication assistance. Transitional Care Pharmacy was contacted informed override assistance is no longer needed.  Medications will be delivered to patient's room prior to discharge.

## 2018-07-06 DIAGNOSIS — I2510 Atherosclerotic heart disease of native coronary artery without angina pectoris: Secondary | ICD-10-CM

## 2018-07-06 DIAGNOSIS — I5043 Acute on chronic combined systolic (congestive) and diastolic (congestive) heart failure: Secondary | ICD-10-CM

## 2018-07-06 LAB — GLUCOSE, CAPILLARY
Glucose-Capillary: 237 mg/dL — ABNORMAL HIGH (ref 70–99)
Glucose-Capillary: 191 mg/dL — ABNORMAL HIGH (ref 70–99)

## 2018-07-06 MED ORDER — ASPIRIN EC 81 MG PO TBEC: 81.0000 mg | DELAYED_RELEASE_TABLET | Freq: Every day | ORAL | 3 refills | Status: DC

## 2018-07-06 MED ORDER — TRUEPLUS LANCETS 28G MISC: 28.0000 g | Freq: Four times a day (QID) | 2 refills | Status: AC

## 2018-07-06 MED ORDER — INSULIN GLARGINE 100 UNIT/ML SOLOSTAR PEN: 15.0000 [IU] | PEN_INJECTOR | Freq: Every day | SUBCUTANEOUS | 0 refills | Status: DC

## 2018-07-06 MED ORDER — BLOOD GLUCOSE METER KIT: PACK | 0 refills | Status: DC

## 2018-07-06 MED ORDER — NITROGLYCERIN 0.4 MG SL SUBL: 0.4000 mg | SUBLINGUAL_TABLET | SUBLINGUAL | 3 refills | Status: DC | PRN

## 2018-07-06 MED ORDER — GLIPIZIDE 10 MG PO TABS: 10.0000 mg | ORAL_TABLET | Freq: Two times a day (BID) | ORAL | 4 refills | Status: DC

## 2018-07-06 NOTE — Progress Notes (Signed)
Consult progress note                                                                               Patient Demographics  Amy Chaney, is a 47 y.o. female, DOB - May 01, 1971, KKX:[id number]  Admit date - 07/03/2018   Admitting Physician Leonie Man, MD  Outpatient Primary MD for the patient is [name], MD  Outpatient specialists:   LOS - 3  days   Medical records reviewed and are as summarized below:    Chief Complaint  Patient presents with  . Chest Pain  . Shortness of Breath  . Nausea       Brief summary   Amy Chaney is an 47 y.o. female with h/o DM; STEMI (2014; 3/19; and last night); HTN; HLD; and depression presenting with STEMI.  She came into the hospital with chest pain since last Thursday.  NTG would make it better and then it would recur, wasn't able to lay down night.  She is now "a whole lot better".  She hasn't checked her sugars in 3-4 months.  She was on metformin, but had GI upset and so stopped it.  She was on insulin a couple of years ago and was lost to f/u and so has not taken it recently.  She has 2 other type 1 diabetics in her home and there weren't enough test strips to allow them all to test.  She lives with her sister, her sister's son, the patient's son.   Assessment & Plan    Principal Problem:   Acute ST elevation myocardial infarction (STEMI) involving left anterior descending (LAD) coronary  artery ([place]), possibly due to noncompliance, poor glycemic control, with underlying history of CAD -Per primary team, cardiology, status post PCI with DES in mid LAD  Active Problems:   Diabetes type 2, uncontrolled ([place]) -Hemoglobin A1c 9.9, reports that she had a discontinued metformin due to side effects, was on insulin few years ago - Has not been checking her blood sugars and currently on no hypoglycemics - Patient acknowledges that she will need close follow-ups with PCP for further adjustments in the insulin regimen, has  a appointment scheduled with [facility] on 12/26 -Discussed with the patient, she feels more comfortable with the Lantus Solostar and Glucotrol, gave prescriptions, sent to [facility]    H/O noncompliance with medical treatment, presenting hazards to health -Counseled strongly on compliance, regular follow-ups, glycemic control    Morbid obesity ([place]): BMI ~36 with HTN, HLD, CAD -Recommended diet and weight control    Hypertension, accelerated, with diastolic congestive heart failure, NYHA class 1 ([place]), hyperlipidemia -will defer to cardiology team    Tobacco abuse -Counseled strongly on nicotine cessation    Code Status: Full code DVT Prophylaxis:  Lovenox Family Communication: Discussed in detail with the patient, all imaging results, lab results explained to the patient    Disposition Plan: Per cardiology, patient is going home, will sign off  Time Spent in minutes 25 minutes  Procedures:  Cardiac cath/PCI on 12/4   Medications  Scheduled Meds: . aspirin EC  81 mg Oral Daily  . atorvastatin  80 mg Oral Daily  . carvedilol  3.125 mg Oral BID  . insulin aspart  0-20 Units Subcutaneous TID WC  . insulin aspart  0-5 Units Subcutaneous QHS  . insulin aspart  6 Units Subcutaneous TID WC  . insulin glargine  15 Units Subcutaneous QHS  . lisinopril  10 mg Oral Daily  . living well with diabetes book   Does not apply Once  . sodium chloride flush  3 mL Intravenous Q12H  . ticagrelor  90 mg Oral BID   Continuous Infusions: . sodium chloride Stopped (07/04/18 1145)   PRN Meds:.sodium chloride, acetaminophen, nitroGLYCERIN, sodium chloride flush   Antibiotics   Anti-infectives (From admission, onward)   None        Subjective:   Amy Chaney was seen and examined today.  No complaints, hoping to go home today.  No chest pain, shortness of breath.  Patient denies abdominal pain, N/V/D/C, new weakness, numbess, tingling. No acute events overnight.     Objective:   Vitals:   07/05/18 2105 07/05/18 2346 07/06/18 0454 07/06/18 0812  BP: 124/60 (!) 107/53 121/66 111/74  Pulse:      Resp: 18 17 (!) 21 17  Temp: 98.7 F (37.1 C) 98.3 F (36.8 C) 98.2 F (36.8 C) 98.6 F (37 C)  TempSrc: Oral Oral Oral Oral  SpO2: 100% 100% 100%   Weight:   102.9 kg   Height:        Intake/Output Summary (Last 24 hours) at 07/06/2018 1041 Last data filed at 07/06/2018 0349 Gross per 24 hour  Intake 240 ml  Output 300 ml  Net -60 ml     Wt Readings from Last 3 Encounters:  07/06/18 102.9 kg  04/02/18 105.1 kg  01/06/18 104.3 kg   Physical Exam  General: Alert and oriented x 3, NAD  Eyes:,  HEENT:   Cardiovascular: S1 S2 auscultated, RRR . No pedal edema b/l  Respiratory: Clear to auscultation bilaterally, no wheezing, rales or rhonchi  Gastrointestinal: Soft, nontender, nondistended, + bowel sounds  Ext: no pedal edema bilaterally  Neuro: No new deficits  Musculoskeletal: No digital cyanosis, clubbing  Skin: No rashes  Psych: Normal affect and demeanor, alert and oriented x3       Data Reviewed:  I have personally reviewed following labs and imaging studies  Micro Results Recent Results (from the past 240 hour(s))  MRSA PCR Screening     Status: None   Collection Time: 07/03/18  8:25 PM  Result Value Ref Range Status   MRSA by PCR NEGATIVE NEGATIVE Final    Comment:        The GeneXpert MRSA Assay (FDA approved for NASAL specimens only), is one component of a comprehensive MRSA colonization surveillance program. It is not intended to diagnose MRSA infection nor to guide or monitor treatment for MRSA infections. Performed at [facility], [place] [address], [place], [place] [address]     Radiology Reports Dg Chest [place] 1 View  Result Date: 07/03/2018 CLINICAL DATA:  47 y/o  F; coronary stents today. EXAM: PORTABLE CHEST 1 VIEW COMPARISON:  01/06/2018 chest radiograph. FINDINGS: Stable cardiac  silhouette given projection and technique. Pulmonary venous hypertension. Clear lungs. No pleural effusion or pneumothorax. No acute osseous abnormality is evident. IMPRESSION: Pulmonary venous hypertension. No focal consolidation. Electronically Signed   By: Kristine Garbe M.D.   On: 07/03/2018 21:14    Lab Data:  CBC: Recent Labs  Lab 07/03/18 1732 07/03/18 1816 07/03/18 2333  07/04/18 0312 07/05/18 0330  WBC 15.6*  --   --  13.9* 14.0*  HGB 14.0 13.3 11.4* 12.2 11.8*  HCT 45.9 39.0 37.7 39.0 38.0  MCV 83.2  --   --  83.2 83.7  PLT 446*  --   --  348 737   Basic Metabolic Panel: Recent Labs  Lab 07/03/18 1732 07/03/18 1816 07/04/18 0312 07/05/18 0330  NA 134* 136 134* 137  K 4.3 3.7 3.9 3.5  CL 100 101 102 104  CO2 25  --  22 22  GLUCOSE 336* 352* 375* 217*  BUN 7 8 8 12   CREATININE 0.72 0.40* 0.69 0.74  CALCIUM 9.7  --  8.7* 8.7*   GFR: Estimated Creatinine Clearance: 109.8 mL/min (by C-G formula based on SCr of 0.74 mg/dL). Liver Function Tests: No results for input(s): AST, ALT, ALKPHOS, BILITOT, PROT, ALBUMIN in the last 168 hours. No results for input(s): LIPASE, AMYLASE in the last 168 hours. No results for input(s): AMMONIA in the last 168 hours. Coagulation Profile: No results for input(s): INR, PROTIME in the last 168 hours. Cardiac Enzymes: Recent Labs  Lab 07/04/18 0312 07/04/18 0550 07/04/18 1259 07/04/18 2136 07/05/18 0019  TROPONINI 23.53* 19.21* 12.97* 10.87* 12.16*   BNP (last 3 results) No results for input(s): PROBNP in the last 8760 hours. HbA1C: Recent Labs    07/04/18 0312  HGBA1C 9.9*   CBG: Recent Labs  Lab 07/05/18 0611 07/05/18 1203 07/05/18 1654 07/05/18 2102 07/06/18 0603  GLUCAP 281* 167* 171* 297* 237*   Lipid Profile: Recent Labs    07/05/18 0330  CHOL 164  HDL 39*  LDLCALC 110*  TRIG 73  CHOLHDL 4.2   Thyroid Function Tests: No results for input(s): TSH, T4TOTAL, FREET4, T3FREE, THYROIDAB in  the last 72 hours. Anemia Panel: No results for input(s): VITAMINB12, FOLATE, FERRITIN, TIBC, IRON, RETICCTPCT in the last 72 hours. Urine analysis:    Component Value Date/Time   BILIRUBINUR neg 03/03/2015 1040   PROTEINUR neg 03/03/2015 1040   UROBILINOGEN 1.0 03/03/2015 1040   NITRITE neg 03/03/2015 1040   LEUKOCYTESUR moderate (2+) (A) 03/03/2015 1040     Ripudeep Rai M.D. [facility] 07/06/2018, 10:41 AM  Pager: [phone] Between 7am to 7pm - call Pager - [phone]  After 7pm go to [email or web address] - password [email or web address]  Call night coverage person covering after 7pm

## 2018-07-06 NOTE — Discharge Summary (Addendum)
Discharge Summary    Patient ID: Amy Chaney MRN: [id number]; DOB: 09/21/1970  Admit date: 07/03/2018 Discharge date: 07/06/2018  Primary Care Provider: [name], MD  Primary Cardiologist: Glenetta Hew, MD   Discharge Diagnoses    Principal Problem:   Acute ST elevation myocardial infarction (STEMI) involving left anterior descending (LAD) coronary artery [facility]) Active Problems:   Diabetes type 2, uncontrolled ([place])   Coronary artery disease involving native coronary artery of native heart with angina pectoris [facility])   H/O noncompliance with medical treatment, presenting hazards to health   Morbid obesity ([place]): BMI ~36 with HTN, HLD, CAD   Hypertension, accelerated, with diastolic congestive heart failure, NYHA class 1 ([place])   Tobacco abuse   Dyslipidemia, goal LDL below 70   Acute on chronic combined systolic and diastolic CHF, NYHA class 3 ([place])   Allergies No Known Allergies  Diagnostic Studies/Procedures    Left Cardiac Catheterization 07/03/2018:  Previously placed Prox LAD to Mid LAD drug eluting stents are widely patent up until the distal edge..  Mid LAD lesion is 100% stenosed at the distal edge of the most distal stent.  A drug-eluting stent was successfully placed using a STENT SYNERGY DES 3X12.  Post intervention, there is a 0% residual stenosis.  Balloon angioplasty was performed in the apical vessel just to help move distal embolization -allowing for flow down to the inferoapex.Colon Flattery 2nd Diag lesion is 100% stenosed. Known occluded stent from previous PCI  Prox RCA to Mid RCA stents are 5% stenosed.  Mid RCA to Dist RCA lesion is 40% stenosed.  Ost 2nd Mrg to 2nd Mrg lesion is 65% stenosed.  There is moderate to severe left ventricular systolic dysfunction.  LV end diastolic pressure is normal.  The left ventricular ejection fraction is 25-35% by visual estimate.  There is trivial (1+) mitral regurgitation.  There is no aortic valve  stenosis.  Post intervention, there is a 0% residual stenosis.  Mid LAD lesion is 100% stenosed.  Summary:  Distal stent edge 100% thrombosis (in the mid LAD) as culprit for anterior STEMI: Treated with PTCA and overlapping DES stent (synergy 3.0 mm x 12 mm postdilated to 3.6 mm in the overlap and 3.3 mm distally.  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 (EF roughly 30 to 35%)  Recommendation:  Admit to CVICU for ongoing care. Will run Aggrastat for 3 hours post PCI.  Long-term dual endplate therapy. Currently aspirin and Brilinta, had been on Effient, but did not afford. Would be okay to use Effient, Brilinta or Plavix as long she will be on it. Uninterrupted for 1 year, would be okay to stop Plavix for 3 months if on Effient or Brilinta, at 6 months if on Plavix.  Restart home medications including carvedilol, lisinopril, atorvastatin  Sliding scale insulin for now. Check A1c.  Check 2D echo to better assess EF.  Would not anticipate fast track discharge based on anterior MI with reduced EF.  Case management or social work consultation to help ensure that she is able to get her medications and not miss doses. Needs lots of education. _______________  Echocardiogram 07/04/2018: Study Conclusions: - Left ventricle: The cavity size was normal. There was mild concentric hypertrophy. Systolic function was moderately reduced. The estimated ejection fraction was in the range of 35% to 40%. Hypokinesis of all of the apical segments. Features are consistent with  a pseudonormal left ventricular filling pattern, with concomitant abnormal relaxation and increased filling pressure (grade 2 diastolic dysfunction). Doppler parameters are consistent with elevated ventricular end-diastolic filling pressure. - Mitral valve: There was  mild regurgitation. - Left atrium: The atrium was mildly dilated. - Right ventricle: Systolic function was normal. - Tricuspid valve: There was trivial regurgitation. - Pulmonary arteries: Systolic pressure was within the normal range. - Inferior vena cava: The vessel was normal in size. - Pericardium, extracardiac: There was no pericardial effusion. There was a left pleural effusion.  Impressions: - Since the last study on 10/11/2017 LVEF has improved from 30% to 35-40% with residual hypokinesis in the apical segments. There is no thrombus on the Definity ecchocontrast images.   History of Present Illness     Amy Chaney is a 47 year old African-American female with hx of CAD s/p RCA and LAD intervention, morbid obesity, uncontrolled diabetes, hypertension and hyperlipidemia as well as medication noncompliance admitted on 07/03/2018 with anterior STEMI.  CAD history with RCA stenting in 2014 (Dr. Einar Gip) followed by anterior ST elevation in March 2019 where she underwent extensive LAD stenting in the bifurcation with diagonal branch that was lost despite crushing balloon stenting.  She had 3 overlapping stents placed in the LAD as well as the occluded stent in the diagonal.   Amy Chaney was last seen in September and was doing relatively well with no active anginal symptoms.  Apparently since then she has been out of her medications because she "cannot afford them ".  Despite this she continues to smoke.  She started having chest pain for about a week intermittently that was exertional.  Also associate with some dyspnea.  She described it as a pressure sensation.  Things sort of stabilized out but at roughly midnight last night prior to presentation she again began having chest pressure and tightness that was not going away on its own.  She did come in because "her ride was drunk and could not drive ".  She finally had recurrence and worsening of pain at roughly 1630 and came  to the  emergency room.  Upon her emergency room evaluation she was found to have anterior ST elevations and code STEMI was called. She was having 9/10 chest pain at the time -described as a heavy pressure sensation.  Was also noted worsening dyspnea and mild nausea with sweating. Brief system delay -2 cath labs were active, and STEMI MD was in clinic across town.  Hospital Course     Consultants: IM  Anterior STEMI - A left heart catheterization was performed on 07/03/2018 which showed distal edge occlusion in her LAD . She underwent radial access LAD PCI and drug-eluting stenting with restoration of antegrade flow. She did have some distal embolization and was placed on Aggrastat briefly. - Patient denied any recurrent angina. Ambulated well.  - Continue dual antiplatelet therapy with Aspirin 51m daily and Brilinta 954mtwice daily.  - Continue high-intensity statin, beta-blocker, and ACEi.  Ischemic Cardiomyopathy - Echo showed LVEF of 35-40% with hypokinesis of all the apical segments and grade 2 diastolic dysfunction. Patient appeared euvolemic and did not required diuretic.  - Continue Coreg 3.12564mwice daily and Lisinopril 29m74mily.  Hypertension - Patient was on low-dose pressors for hypotension. Dopamine was weaned off with improved. . - Continue Coreg and Lisinopril.   Hyperlipidemia - 07/05/2018: Cholesterol 164; HDL 39; LDL Cholesterol 110; Triglycerides 73; VLDL 15Goal <70 given CAD. Continue Lipitor 80mg17mly.  - Consider OP  f/u labs 6-8 weeks given statin initiation this admission.  Type 2 Diabetes Mellitus  - Previously on Metformin but  discontinued  due to side effects - Hgb A1c 9.9 this admission. - Diabetes Coordinator and IM  Consulted - Started on Insulin  Medication Noncompliance - Likely multifactorial.  - Case Management to assist with medication assistance.  - Dicussed importance of medication compliance   Discharge Vitals Blood pressure 111/74, pulse  69, temperature 98.6 F (37 C), temperature source Oral, resp. rate 17, height 5' 9"  (1.753 m), weight 102.9 kg, last menstrual period 10/20/2015, SpO2 100 %.  Filed Weights   07/03/18 1730 07/04/18 2300 07/06/18 0454  Weight: 104.3 kg 103.7 kg 102.9 kg    Physical Exam  Constitutional: She is oriented to person, place, and time and well-developed, well-nourished, and in no distress.  HENT:  Head: Normocephalic and atraumatic.  Eyes: EOM are normal.  Neck: Normal range of motion. Neck supple.  Cardiovascular: Normal rate and regular rhythm.  Right radial without hematoma   Pulmonary/Chest: Effort normal and breath sounds normal.  Abdominal: Soft. Bowel sounds are normal.  Musculoskeletal: Normal range of motion.  Neurological: She is alert and oriented to person, place, and time.  Skin: Skin is warm and dry.  Psychiatric: Affect normal.    Labs & Radiologic Studies    CBC Recent Labs    07/04/18 0312 07/05/18 0330  WBC 13.9* 14.0*  HGB 12.2 11.8*  HCT 39.0 38.0  MCV 83.2 83.7  PLT 348 387   Basic Metabolic Panel Recent Labs    07/04/18 0312 07/05/18 0330  NA 134* 137  K 3.9 3.5  CL 102 104  CO2 22 22  GLUCOSE 375* 217*  BUN 8 12  CREATININE 0.69 0.74  CALCIUM 8.7* 8.7*   Cardiac Enzymes Recent Labs    07/04/18 1259 07/04/18 2136 07/05/18 0019  TROPONINI 12.97* 10.87* 12.16*   Hemoglobin A1C Recent Labs    07/04/18 0312  HGBA1C 9.9*   Fasting Lipid Panel Recent Labs    07/05/18 0330  CHOL 164  HDL 39*  LDLCALC 110*  TRIG 73  CHOLHDL 4.2  _____________  Dg Chest Port 1 View  Result Date: 07/03/2018 CLINICAL DATA:  47 y/o  F; coronary stents today. EXAM: PORTABLE CHEST 1 VIEW COMPARISON:  01/06/2018 chest radiograph. FINDINGS: Stable cardiac silhouette given projection and technique. Pulmonary venous hypertension. Clear lungs. No pleural effusion or pneumothorax. No acute osseous abnormality is evident. IMPRESSION: Pulmonary venous  hypertension. No focal consolidation. Electronically Signed   By: Kristine Garbe M.D.   On: 07/03/2018 21:14   Disposition   Pt is being discharged home today in good condition.  Follow-up Plans & Appointments    Follow-up Information    [employer] Go on 07/25/2018.   Why:  Patient made appointment for 2:30 pm. Take your list of medications and application for Brilinta with you. Contact information: [place] [address] [phone]       Barrett, Evelene Croon, PA-C. Go on 07/15/2018.   Specialties:  Cardiology, Radiology Why:  @10am  for hospital cardiology follow up  Contact information: [address] [place]  [address] [phone]          Discharge Instructions    AMB Referral to Cardiac Rehabilitation - Phase II   Complete by:  As directed    Diagnosis:   STEMI Coronary Stents     Amb Referral to Cardiac Rehabilitation  Complete by:  As directed    Diagnosis:   STEMI Coronary Stents     Diet - low sodium heart healthy   Complete by:  As directed    Discharge instructions   Complete by:  As directed    No driving for 2 weeks. No lifting over 10 lbs for 4 weeks. No sexual activity for 4 weeks. You may not return to work until cleared by your cardiologist. Keep procedure site clean & dry. If you notice increased pain, swelling, bleeding or pus, call/return!  You may shower, but no soaking baths/hot tubs/pools for 1 week.   Increase activity slowly   Complete by:  As directed       Discharge Medications   Allergies as of 07/06/2018   No Known Allergies     Medication List    STOP taking these medications   chlorthalidone 25 MG tablet Commonly known as:  HYGROTON   naproxen sodium 220 MG tablet Commonly known as:  ALEVE   prasugrel 10 MG Tabs tablet Commonly known as:  EFFIENT     TAKE these medications   aspirin EC 81 MG tablet Take 1 tablet (81 mg total)  by mouth daily.   atorvastatin 80 MG tablet Commonly known as:  LIPITOR Take 1 tablet (80 mg total) by mouth daily. What changed:    medication strength  how much to take   blood glucose meter kit and supplies Dispense based on patient and insurance preference. Use up to four times daily as directed. (FOR ICD-10 E10.9, E11.9).   carvedilol 3.125 MG tablet Commonly known as:  COREG Take 1 tablet (3.125 mg total) by mouth 2 (two) times daily. What changed:    medication strength  how much to take  additional instructions   glipiZIDE 10 MG tablet Commonly known as:  GLUCOTROL Take 1 tablet (10 mg total) by mouth 2 (two) times daily before a meal.   glucose blood test strip Use as instructed   Insulin Glargine 100 UNIT/ML Solostar Pen Commonly known as:  LANTUS Inject 15 Units into the skin daily.   lisinopril 10 MG tablet Commonly known as:  PRINIVIL,ZESTRIL Take 1 tablet (10 mg total) by mouth daily. What changed:    medication strength  how much to take   nitroGLYCERIN 0.4 MG SL tablet Commonly known as:  NITROSTAT Place 1 tablet (0.4 mg total) under the tongue every 5 (five) minutes as needed for chest pain. Reported on 11/25/2015   ticagrelor 90 MG Tabs tablet Commonly known as:  BRILINTA Take 1 tablet (90 mg total) by mouth 2 (two) times daily.   TRUE METRIX METER Devi 1 kit by Does not apply route 4 (four) times daily.   TRUEPLUS LANCETS 28G Misc 28 g by Does not apply route 4 (four) times daily.        Acute coronary syndrome (MI, NSTEMI, STEMI, etc) this admission?: Yes.     AHA/ACC Clinical Performance & Quality Measures: 1. Aspirin prescribed? - Yes 2. ADP Receptor Inhibitor (Plavix/Clopidogrel, Brilinta/Ticagrelor or Effient/Prasugrel) prescribed (includes medically managed patients)? - Yes 3. Beta Blocker prescribed? - Yes 4. High Intensity Statin (Lipitor 40-11m or Crestor 20-458m prescribed? - Yes 5. EF assessed during THIS  hospitalization? - Yes 6. For EF <40%, was ACEI/ARB prescribed? - Yes 7. For EF <40%, Aldosterone Antagonist (Spironolactone or Eplerenone) prescribed? - No - Reason:  [facility]  8. Cardiac Rehab Phase II ordered (Included Medically managed Patients)? - Yes  Outstanding Labs/Studies   Consider OP f/u labs 6-8 weeks given statin initiation this admission.  Duration of Discharge Encounter   Greater than 30 minutes including physician time.  SignedCrista Luria [place], PA 07/06/2018, 10:01 AM   The patient was seen and examined, and I agree with the history, physical exam, assessment and plan as documented above, with modifications as noted below.  She is doing well and is without complaints of chest pain, palpitations, leg swelling, and shortness of breath.  Medication recommendations and follow up as documented above.  Will discharge today. Social work assistance has been arranged for meds.   Kate Sable, MD, [facility]  07/06/2018 10:27 AM

## 2018-07-06 NOTE — Discharge Instructions (Signed)
   1. Weigh yourself EVERY morning after you go to the bathroom but before you eat or drink anything. Write this number down in a weight log/diary. If you gain 3 pounds overnight or 5 pounds in a week, call the office. 2. Take your medicines as prescribed. If you have concerns about your medications, please call us before you stop taking them.  3. Eat low salt foods--Limit salt (sodium) to 2000 mg per day. This will help prevent your body from holding onto fluid. Read food labels as many processed foods have a lot of sodium, especially canned goods and prepackaged meats. If you would like some assistance choosing low sodium foods, we would be happy to set you up with a nutritionist. 4. Stay as active as you can everyday. Staying active will give you more energy and make your muscles stronger. Start with 5 minutes at a time and work your way up to 30 minutes a day. Break up your activities--do some in the morning and some in the afternoon. Start with 3 days per week and work your way up to 5 days as you can.  If you have chest pain, feel short of breath, dizzy, or lightheaded, STOP. If you don't feel better after a short rest, call 911. If you do feel better, call the office to let us know you have symptoms with exercise. 5. Limit all fluids for the day to less than 2 liters. Fluid includes all drinks, coffee, juice, ice chips, soup, jello, and all other liquids.  

## 2018-07-06 NOTE — Progress Notes (Signed)
CARDIAC REHAB PHASE I   PRE:  Rate/Rhythm: Sr 76  BP:  Sitting: 135/73     MODE:  Ambulation: 470 ft   POST:  Rate/Rhythm: SR 90  BP:  Sitting: 154/87      1045-1105  Upon arrival, pt sitting up in chair eager to walk.  Pt ambulated 470 ft independently.  Pt did not have any questions about education completed yesterday.    Noel Christmas, RN 07/06/2018 11:01 AM

## 2018-07-06 NOTE — Progress Notes (Signed)
Pt discharged per MD orders, discharge instruction reviewed with pt and family. Educated on radial incision site and medications as well. Pt and family both verbalize understanding, all questions answered to their satisfaction. Pt discharged to personal home via personal vehicle.

## 2018-07-08 ENCOUNTER — Telehealth: Payer: Self-pay

## 2018-07-08 NOTE — Telephone Encounter (Signed)
Transition Care Management Follow-up Telephone Call  Date of discharge and from where: 07/06/2018, [facility]  How have you been since you were released from the hospital? She said that she is feeling " okay" except her legs feel weak and she can't stand for a long time.   Any questions or concerns? No questions or concerns reported.  She said that she still needs to collect the information from the IRS needed for the [facility].  She said that she will pick up a new application when she is in the clinic tomorrow.   Items Reviewed:  Did the pt receive and understand the discharge instructions provided?   She said that she has the instructions and did not have any questions.   Medications obtained and verified? She said that she has all of her medications. The orders for the lantus and brilinta were reviewed as well as the changes with the atorvastatin, carvedilol,lisinopril.  She also confirmed that she stopped the chlorthalidone, aleve and effient    She said that she already had  the glucometer but just needed new strips. No other questions about her medications were reported.   Any new allergies since your hospitalization? - none reported  Do you have support at home? She said that she lives with someone but she is basically on her own   Other (ie: DME, Home Health, etc) she has a scale and said that she will weigh herself daily and keep a log of the weights.    Reviewed with her that she is to check her blood sugars three times daily before meals and at bedtime and to also log those results. She reported that her blood sugar this morning was 223.   Functional Questionnaire: (I = Independent and D = Dependent) ADL's: independent  Follow up appointments reviewed:    PCP Hospital f/u appt confirmed? Appointment scheduled for tomorrow- 07/09/18 @ 1050 with Dr Margarita Rana.  Instructed her to bring all of her medications with her to her  appointment tomorrow for Dr Margarita Rana to review.  [facility] f/u appt confirmed? Cardiology appointment 07/15/18  She said that she has not yet heard from cardiac rehab.   Are transportation arrangements needed? She said that she takes the bus or her daughter will take her to appointments  If their condition worsens, is the pt aware to call  their PCP or go to the ED? yes  Was the patient provided with contact information for the PCP's office or ED?  She has the phone number phone.  Was the pt encouraged to call back with questions or concerns? yes

## 2018-07-09 ENCOUNTER — Ambulatory Visit: Payer: Self-pay | Attending: Family Medicine | Admitting: Family Medicine

## 2018-07-09 ENCOUNTER — Encounter: Payer: Self-pay | Admitting: Family Medicine

## 2018-07-09 ENCOUNTER — Telehealth: Payer: Self-pay

## 2018-07-09 VITALS — BP 130/80 | HR 78 | Ht 69.0 in | Wt 230.8 lb

## 2018-07-09 DIAGNOSIS — I252 Old myocardial infarction: Secondary | ICD-10-CM | POA: Insufficient documentation

## 2018-07-09 DIAGNOSIS — E1165 Type 2 diabetes mellitus with hyperglycemia: Secondary | ICD-10-CM | POA: Insufficient documentation

## 2018-07-09 DIAGNOSIS — Z955 Presence of coronary angioplasty implant and graft: Secondary | ICD-10-CM | POA: Insufficient documentation

## 2018-07-09 DIAGNOSIS — I2102 ST elevation (STEMI) myocardial infarction involving left anterior descending coronary artery: Secondary | ICD-10-CM | POA: Insufficient documentation

## 2018-07-09 DIAGNOSIS — I2510 Atherosclerotic heart disease of native coronary artery without angina pectoris: Secondary | ICD-10-CM | POA: Insufficient documentation

## 2018-07-09 DIAGNOSIS — Z9889 Other specified postprocedural states: Secondary | ICD-10-CM | POA: Insufficient documentation

## 2018-07-09 DIAGNOSIS — I110 Hypertensive heart disease with heart failure: Secondary | ICD-10-CM | POA: Insufficient documentation

## 2018-07-09 DIAGNOSIS — Z7982 Long term (current) use of aspirin: Secondary | ICD-10-CM | POA: Insufficient documentation

## 2018-07-09 DIAGNOSIS — Z9049 Acquired absence of other specified parts of digestive tract: Secondary | ICD-10-CM | POA: Insufficient documentation

## 2018-07-09 DIAGNOSIS — I5042 Chronic combined systolic (congestive) and diastolic (congestive) heart failure: Secondary | ICD-10-CM | POA: Insufficient documentation

## 2018-07-09 DIAGNOSIS — Z9114 Patient's other noncompliance with medication regimen: Secondary | ICD-10-CM | POA: Insufficient documentation

## 2018-07-09 DIAGNOSIS — Z79899 Other long term (current) drug therapy: Secondary | ICD-10-CM | POA: Insufficient documentation

## 2018-07-09 DIAGNOSIS — Z794 Long term (current) use of insulin: Secondary | ICD-10-CM | POA: Insufficient documentation

## 2018-07-09 LAB — GLUCOSE, POCT (MANUAL RESULT ENTRY): POC Glucose: 317 mg/dl — AB (ref 70–99)

## 2018-07-09 MED ORDER — CARVEDILOL 3.125 MG PO TABS: 3.1250 mg | ORAL_TABLET | Freq: Two times a day (BID) | ORAL | 2 refills | Status: DC

## 2018-07-09 MED ORDER — GLIPIZIDE 10 MG PO TABS: 10.0000 mg | ORAL_TABLET | Freq: Two times a day (BID) | ORAL | 6 refills | Status: DC

## 2018-07-09 MED ORDER — LISINOPRIL 10 MG PO TABS: 10.0000 mg | ORAL_TABLET | Freq: Every day | ORAL | 6 refills | Status: DC

## 2018-07-09 MED ORDER — INSULIN GLARGINE 100 UNIT/ML SOLOSTAR PEN: 25.0000 [IU] | PEN_INJECTOR | Freq: Every day | SUBCUTANEOUS | 3 refills | Status: DC

## 2018-07-09 MED FILL — glipiZIDE 10 MG TABS: 10 | 30 days supply | Qty: 60 | Fill #0

## 2018-07-09 NOTE — Progress Notes (Signed)
[place]  Date of Telephone Encounter:07/06/18  Date of 1st service:07/09/18   Admit Date:07/03/18 Discharge Date: 07/06/18    Subjective:  Patient ID: Amy Chaney, female    DOB: 11-01-1970  Age: 47 y.o. MRN: [id number]  CC: Code STEMI   HPI Amy Chaney  is a 47 year old female with a history of type 2 diabetes mellitus (A1c 8.6), hypertension, CHF (EF 35% from 09/2017), CAD (status post PCI to LAD with 3 overlapping DES in 09/2017) with recent hospitalization for STEMI. She had presented to the ED with chest pain and had endorsed non compliance with her medications due to running out. EKG revealed anterior STEMI.  She underwent cardiac cath which revealed distal edge occlusion in her LAD with cardiac cath findings below: SUMMARY:  Distal stent edge 100% thrombosis (in the mid LAD) as culprit for anterior STEMI: Treated with PTCA and overlapping DES stent (synergy 3.0 mm x 12 mm postdilated to 3.6 mm in the overlap and 3.3 mm distally.  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  (EF roughly 30 to 35%)  RECOMMENDATION:  Admit to CVICU for ongoing care.  Will run Aggrastat for 3 hours post PCI.  Long-term dual endplate therapy.  Currently aspirin and Brilinta, had been on Effient, but did not afford.  Would be okay to use Effient, Brilinta or Plavix as long she will be on it.  Uninterrupted for 1 year, would be okay to stop Plavix for 3 months if on Effient or Brilinta, at 6 months if on Plavix.  Restart home medications including carvedilol, lisinopril, atorvastatin  Sliding scale insulin for now.  Check A1c.  Check 2D echo to better assess EF.  Would not anticipate fast track discharge based on anterior MI with reduced EF.  Case management or social work consultation to help ensure that she is able to get her  medications and not miss doses.  Needs lots of education.   She presents today doing well and quit smoking one year ago. She denies chest pain,dyspnea, pedal edema. Her blood sugars have still been elevated as she has not been compliant with a Diabetic diet -CBG is 317 in the clinic and she just had sweet tea. Endorses compliance with her medications. She has no additional concerns today.   Past Medical History:  Diagnosis Date  . Coronary artery disease involving native coronary artery of native heart with angina pectoris ([place]) 12/09/2014   a. s/p PCI of the mid RCA 01/2013 //  b. LHC 9/14: EF 55%, RCA stent 100% occluded, LAD irregularities >>  subacute stent thrombosis, Promus DES PCI distal overlap // c) 09/2017 - Ant STEMI - LAD-D2 PCI - Successful PCI of LAD (3 overlapping DES), unable to restore flow down D2l that was stented as well.  Likely related to downstream dissection, but unable to rewire.  . Daily headache   . Depression   . Dyslipidemia, goal LDL below 70   . History of Doppler ultrasound    carotid bruit >> a. Carotid US 6/17: bilat ICA 1-39%  . Hypertension   . Prolonged Q-T interval on ECG 01/11/2016  . STEMI involving left anterior descending coronary artery ([place]) 09/2017   Severe [place] 1, 1, 1 LAD-D2 lesion (complicated by post PTCA dissection/intramural hematoma) -successful extensive PCI of the LAD but unable to maintain patency of the stented D2.   Marland Kitchen STEMI involving  oth coronary artery of inferior wall ([place]) 2014   Secondary to subacute stent thrombosis of RCA stent segment having missed 4 doses of Effient.  . Tobacco abuse 01/11/2016  . Type II diabetes mellitus ([place]) 12/2010   sister and son also diabetic     Past Surgical History:  Procedure Laterality Date  . CHOLECYSTECTOMY  ~ 2000  . CORONARY STENT INTERVENTION N/A 10/10/2017   Procedure: CORONARY STENT INTERVENTION;  Surgeon: Leonie Man, MD;  Location: [place] [facility];  Service: Cardiovascular;  LAD PCI: STENT SYNERGY DES 3.5X32 (p),STENT SYNERGY DES 3.5X 8 (m), STENT SYNERGY DES 3.5X28 (d).  D2 PCI: STENT SYNERGY DES 2.5X24.  (UNABL TO REWIRE & EXPAND - TO OF DIAG AT END OF CASE)  . CORONARY STENT INTERVENTION N/A 07/03/2018   Procedure: CORONARY STENT INTERVENTION;  Surgeon: Leonie Man, MD;  Location: [place] [facility];  Service: Cardiovascular;  Laterality: N/A;  . LEFT HEART CATH AND CORONARY ANGIOGRAPHY N/A 10/10/2017   Procedure: LEFT HEART CATH AND CORONARY ANGIOGRAPHY;  Surgeon: Leonie Man, MD;  Location: [place] [facility];  Service: Cardiovascular;  Laterality: N/A; -patent RCA stents with mild in-stent restenosis. [place] 1,1,1 mLD-D2 41% (complicated by dissection/intramural thrombus)  . LEFT HEART CATH AND CORONARY ANGIOGRAPHY N/A 07/03/2018   Procedure: LEFT HEART CATH AND CORONARY ANGIOGRAPHY;  Surgeon: Leonie Man, MD;  Location: [place] [facility];  Service: Cardiovascular;  Laterality: N/A;  . LEFT HEART CATHETERIZATION WITH CORONARY ANGIOGRAM N/A 02/25/2013   Procedure: LEFT HEART CATHETERIZATION WITH CORONARY ANGIOGRAM;  Surgeon: Laverda Page, MD;  Location: [facility] C[facility] LAB;  Service: Cardiovascular;; CTO m RCA; otherwise normal coronaries  . LEFT HEART CATHETERIZATION WITH CORONARY ANGIOGRAM N/A 04/01/2013   Procedure: LEFT HEART CATHETERIZATION WITH CORONARY ANGIOGRAM;  Surgeon: Laverda Page, MD;  Location: [facility] [place] CATH LAB;  Service: Cardiovascular: 100% occlusion of distal RCA stent (subacute stent thrombosis -  secondary to stopping Effient).  Overlapping DES PCI  . NM MYOVIEW LTD  12/2015    EF 38%.  Hypertensive response to exercise (231/132 mmHg).  INTERMEDIATE RISK due to -diffuse hypokinesis and reduced EF.  No ischemia or infarction.  Marland Kitchen PERCUTANEOUS CORONARY STENT INTERVENTION (PCI-S)  04/01/2013   Procedure: PERCUTANEOUS CORONARY STENT INTERVENTION (PCI-S);  Surgeon: Laverda Page, MD;  Location: [facility] CATH LAB;  Service: Cardiovascular;; PCI of  distal RCA stent subacute thrombosis: Promus Premier DES 3.0 mm x 20 mm.  Marland Kitchen PERCUTANEOUS CORONARY STENT INTERVENTION (PCI-S)  02/25/2013   Procedure: PERCUTANEOUS CORONARY STENT INTERVENTION (PCI-S);  Surgeon: Laverda Page, MD;  Location: [facility] CATH LAB;  RCA CTO PCI with overlapping Promus DES: 3.0 mm x 38 mm, 3.5 mm x 54m  . TRANSTHORACIC ECHOCARDIOGRAM  12/2015   mild LVH, EF 55-60%, no RWMA, Gr 1 DD, mild MR  . TRANSTHORACIC ECHOCARDIOGRAM  09/2017   In setting of anterior STEMI:  EF 35% with mild concentric hypertrophy.  GRII DD.  Anteroseptal, anterior and anterolateral walls hypokinetic.  Moderate MR.  . TUBAL LIGATION  1994    No Known Allergies   Outpatient Medications Prior to Visit  Medication Sig Dispense Refill  . aspirin EC 81 MG tablet Take 1 tablet (81 mg total) by mouth daily. 90 tablet 3  . atorvastatin (LIPITOR) 80 MG tablet Take 1 tablet (80 mg total) by mouth daily. 30 tablet 2  . blood glucose meter kit and supplies Dispense based on patient and insurance preference. Use up to four times  daily as directed. (FOR ICD-10 E10.9, E11.9). 1 each 0  . Blood Glucose Monitoring Suppl (TRUE METRIX METER) DEVI 1 kit by Does not apply route 4 (four) times daily. 1 Device 0  . glucose blood (TRUE METRIX BLOOD GLUCOSE TEST) test strip Use as instructed 100 each 12  . nitroGLYCERIN (NITROSTAT) 0.4 MG SL tablet Place 1 tablet (0.4 mg total) under the tongue every 5 (five) minutes as needed for chest pain. Reported on 11/25/2015 25 tablet 3  . ticagrelor (BRILINTA) 90 MG TABS tablet Take 1 tablet (90 mg total) by mouth 2 (two) times daily. 60 tablet 11  . TRUEPLUS LANCETS 28G MISC 28 g by Does not apply route 4 (four) times daily. 120 each 2  . carvedilol (COREG) 3.125 MG tablet Take 1 tablet (3.125 mg total) by mouth 2 (two) times daily. 60 tablet 2  . glipiZIDE (GLUCOTROL) 10 MG tablet Take 1 tablet (10 mg total) by mouth 2 (two) times daily before a meal. 60 tablet 4  . Insulin  Glargine (LANTUS SOLOSTAR) 100 UNIT/ML Solostar Pen Inject 15 Units into the skin daily. 15 mL 0  . lisinopril (PRINIVIL,ZESTRIL) 10 MG tablet Take 1 tablet (10 mg total) by mouth daily. 30 tablet 2   No facility-administered medications prior to visit.     ROS Review of Systems  Constitutional: Negative for activity change, appetite change and fatigue.  HENT: Negative for congestion, sinus pressure and sore throat.   Eyes: Negative for visual disturbance.  Respiratory: Negative for cough, chest tightness, shortness of breath and wheezing.   Cardiovascular: Negative for chest pain and palpitations.  Gastrointestinal: Negative for abdominal distention, abdominal pain and constipation.  Endocrine: Negative for polydipsia.  Genitourinary: Negative for dysuria and frequency.  Musculoskeletal: Negative for arthralgias and back pain.  Skin: Negative for rash.  Neurological: Negative for tremors, light-headedness and numbness.  Hematological: Does not bruise/bleed easily.  Psychiatric/Behavioral: Negative for agitation and behavioral problems.    Objective:  BP 130/80   Pulse 78   Ht 5' 9"  (1.753 m)   Wt 230 lb 12.8 oz (104.7 kg)   LMP 10/20/2015   SpO2 100%   BMI 34.08 kg/m   BP/Weight 07/09/2018 96/01/8937 1/0/1751  Systolic BP 025 852 778  Diastolic BP 80 74 94  Wt. (Lbs) 230.8 226.9 231.8  BMI 34.08 33.51 34.23      Physical Exam  Constitutional: She is oriented to person, place, and time. She appears well-developed and well-nourished.  Cardiovascular: Normal rate, normal heart sounds and intact distal pulses.  No murmur heard. Pulmonary/Chest: Effort normal and breath sounds normal. She has no wheezes. She has no rales. She exhibits no tenderness.  Abdominal: Soft. Bowel sounds are normal. She exhibits no distension and no mass. There is no tenderness.  Musculoskeletal: Normal range of motion.  Neurological: She is alert and oriented to person, place, and time.  Skin:  Skin is warm and dry.  Psychiatric: She has a normal mood and affect.    CMP Latest Ref Rng & Units 07/05/2018 07/04/2018 07/03/2018  Glucose 70 - 99 mg/dL 217(H) 375(H) 352(H)  BUN 6 - 20 mg/dL 12 8 8   Creatinine 0.44 - 1.00 mg/dL 0.74 0.69 0.40(L)  Sodium 135 - 145 mmol/L 137 134(L) 136  Potassium 3.5 - 5.1 mmol/L 3.5 3.9 3.7  Chloride 98 - 111 mmol/L 104 102 101  CO2 22 - 32 mmol/L 22 22 -  Calcium 8.9 - 10.3 mg/dL 8.7(L) 8.7(L) -  Total Protein 6.5 -  8.1 g/dL - - -  Total Bilirubin 0.3 - 1.2 mg/dL - - -  Alkaline Phos 38 - 126 U/L - - -  AST 15 - 41 U/L - - -  ALT 14 - 54 U/L - - -     Lab Results  Component Value Date   HGBA1C 9.9 (H) 07/04/2018    Assessment & Plan:   1. Uncontrolled type 2 diabetes mellitus with hyperglycemia (HCC) Uncontrolled with A1c of 9.9; elevated CGG of 317 -just drank sweet tea Increase Lantus dose Counseled on Diabetic diet, my plate method, 191 minutes of moderate intensity exercise/week Keep blood sugar logs with fasting goals of 80-120 mg/dl, random of less than 180 and in the event of sugars less than 60 mg/dl or greater than 400 mg/dl please notify the clinic ASAP. It is recommended that you undergo annual eye exams and annual foot exams. Pneumonia vaccine is recommended. - POCT glucose (manual entry) - Insulin Glargine (LANTUS SOLOSTAR) 100 UNIT/ML Solostar Pen; Inject 25 Units into the skin daily.  Dispense: 30 mL; Refill: 3 - glipiZIDE (GLUCOTROL) 10 MG tablet; Take 1 tablet (10 mg total) by mouth 2 (two) times daily before a meal.  Dispense: 60 tablet; Refill: 6  2. Acute ST elevation myocardial infarction (STEMI) involving left anterior descending (LAD) coronary artery (HCC) S/p overlapping DES  Dual antiplatelet therapy with Brilinta and ASA Risk factor modification - commended on smoking cessation Continue Statin Appointment with Cardiology in a few days  3. S/P primary angioplasty with coronary stent See #2 above  4.  Hypertensive heart disease with chronic combined systolic and diastolic congestive heart failure (HCC) EF 35% Euvolemic Continue ACEI, BB - carvedilol (COREG) 3.125 MG tablet; Take 1 tablet (3.125 mg total) by mouth 2 (two) times daily.  Dispense: 60 tablet; Refill: 2 - lisinopril (PRINIVIL,ZESTRIL) 10 MG tablet; Take 1 tablet (10 mg total) by mouth daily.  Dispense: 30 tablet; Refill: 6   Meds ordered this encounter  Medications  . Insulin Glargine (LANTUS SOLOSTAR) 100 UNIT/ML Solostar Pen    Sig: Inject 25 Units into the skin daily.    Dispense:  30 mL    Refill:  3    Discontinue previous dose  . carvedilol (COREG) 3.125 MG tablet    Sig: Take 1 tablet (3.125 mg total) by mouth 2 (two) times daily.    Dispense:  60 tablet    Refill:  2  . lisinopril (PRINIVIL,ZESTRIL) 10 MG tablet    Sig: Take 1 tablet (10 mg total) by mouth daily.    Dispense:  30 tablet    Refill:  6  . glipiZIDE (GLUCOTROL) 10 MG tablet    Sig: Take 1 tablet (10 mg total) by mouth 2 (two) times daily before a meal.    Dispense:  60 tablet    Refill:  6    Follow-up: Return in about 1 month (around 08/09/2018) for complete physical exam.   [name] MD

## 2018-07-09 NOTE — Patient Instructions (Signed)
Diabetes Mellitus and Nutrition When you have diabetes (diabetes mellitus), it is very important to have healthy eating habits because your blood sugar (glucose) levels are greatly affected by what you eat and drink. Eating healthy foods in the appropriate amounts, at about the same times every day, can help you:  Control your blood glucose.  Lower your risk of heart disease.  Improve your blood pressure.  Reach or maintain a healthy weight.  Every person with diabetes is different, and each person has different needs for a meal plan. Your health care provider may recommend that you work with a diet and nutrition specialist (dietitian) to make a meal plan that is best for you. Your meal plan may vary depending on factors such as:  The calories you need.  The medicines you take.  Your weight.  Your blood glucose, blood pressure, and cholesterol levels.  Your activity level.  Other health conditions you have, such as heart or kidney disease.  How do carbohydrates affect me? Carbohydrates affect your blood glucose level more than any other type of food. Eating carbohydrates naturally increases the amount of glucose in your blood. Carbohydrate counting is a method for keeping track of how many carbohydrates you eat. Counting carbohydrates is important to keep your blood glucose at a healthy level, especially if you use insulin or take certain oral diabetes medicines. It is important to know how many carbohydrates you can safely have in each meal. This is different for every person. Your dietitian can help you calculate how many carbohydrates you should have at each meal and for snack. Foods that contain carbohydrates include:  Bread, cereal, rice, pasta, and crackers.  Potatoes and corn.  Peas, beans, and lentils.  Milk and yogurt.  Fruit and juice.  Desserts, such as cakes, cookies, ice cream, and candy.  How does alcohol affect me? Alcohol can cause a sudden decrease in blood  glucose (hypoglycemia), especially if you use insulin or take certain oral diabetes medicines. Hypoglycemia can be a life-threatening condition. Symptoms of hypoglycemia (sleepiness, dizziness, and confusion) are similar to symptoms of having too much alcohol. If your health care provider says that alcohol is safe for you, follow these guidelines:  Limit alcohol intake to no more than 1 drink per day for nonpregnant women and 2 drinks per day for men. One drink equals 12 oz of beer, 5 oz of wine, or 1 oz of hard liquor.  Do not drink on an empty stomach.  Keep yourself hydrated with water, diet soda, or unsweetened iced tea.  Keep in mind that regular soda, juice, and other mixers may contain a lot of sugar and must be counted as carbohydrates.  What are tips for following this plan? Reading food labels  Start by checking the serving size on the label. The amount of calories, carbohydrates, fats, and other nutrients listed on the label are based on one serving of the food. Many foods contain more than one serving per package.  Check the total grams (g) of carbohydrates in one serving. You can calculate the number of servings of carbohydrates in one serving by dividing the total carbohydrates by 15. For example, if a food has 30 g of total carbohydrates, it would be equal to 2 servings of carbohydrates.  Check the number of grams (g) of saturated and trans fats in one serving. Choose foods that have low or no amount of these fats.  Check the number of milligrams (mg) of sodium in one serving. Most people   should limit total sodium intake to less than 2,300 mg per day.  Always check the nutrition information of foods labeled as "low-fat" or "nonfat". These foods may be higher in added sugar or refined carbohydrates and should be avoided.  Talk to your dietitian to identify your daily goals for nutrients listed on the label. Shopping  Avoid buying canned, premade, or processed foods. These  foods tend to be high in fat, sodium, and added sugar.  Shop around the outside edge of the grocery store. This includes fresh fruits and vegetables, bulk grains, fresh meats, and fresh dairy. Cooking  Use low-heat cooking methods, such as baking, instead of high-heat cooking methods like deep frying.  Cook using healthy oils, such as olive, canola, or sunflower oil.  Avoid cooking with butter, cream, or high-fat meats. Meal planning  Eat meals and snacks regularly, preferably at the same times every day. Avoid going long periods of time without eating.  Eat foods high in fiber, such as fresh fruits, vegetables, beans, and whole grains. Talk to your dietitian about how many servings of carbohydrates you can eat at each meal.  Eat 4-6 ounces of lean protein each day, such as lean meat, chicken, fish, eggs, or tofu. 1 ounce is equal to 1 ounce of meat, chicken, or fish, 1 egg, or 1/4 cup of tofu.  Eat some foods each day that contain healthy fats, such as avocado, nuts, seeds, and fish. Lifestyle   Check your blood glucose regularly.  Exercise at least 30 minutes 5 or more days each week, or as told by your health care provider.  Take medicines as told by your health care provider.  Do not use any products that contain nicotine or tobacco, such as cigarettes and e-cigarettes. If you need help quitting, ask your health care provider.  Work with a counselor or diabetes educator to identify strategies to manage stress and any emotional and social challenges. What are some questions to ask my health care provider?  Do I need to meet with a diabetes educator?  Do I need to meet with a dietitian?  What number can I call if I have questions?  When are the best times to check my blood glucose? Where to find more information:  American Diabetes Association: diabetes.org/food-and-fitness/food  Academy of Nutrition and Dietetics:  www.eatright.org/resources/health/diseases-and-conditions/diabetes  National Institute of Diabetes and Digestive and Kidney Diseases (NIH): www.niddk.nih.gov/health-information/diabetes/overview/diet-eating-physical-activity Summary  A healthy meal plan will help you control your blood glucose and maintain a healthy lifestyle.  Working with a diet and nutrition specialist (dietitian) can help you make a meal plan that is best for you.  Keep in mind that carbohydrates and alcohol have immediate effects on your blood glucose levels. It is important to count carbohydrates and to use alcohol carefully. This information is not intended to replace advice given to you by your health care provider. Make sure you discuss any questions you have with your health care provider. Document Released: 04/13/2005 Document Revised: 08/21/2016 Document Reviewed: 08/21/2016 Elsevier Interactive Patient Education  2018 [employer]  

## 2018-07-15 ENCOUNTER — Ambulatory Visit: Payer: Self-pay | Admitting: Physician Assistant

## 2018-07-15 NOTE — Progress Notes (Deleted)
Cardiology Office Note   Date:  07/15/2018   ID:  Amy Chaney, DOB 1970/12/05, MRN [id number]  PCP:  [name], MD Cardiologist:  Glenetta Hew, MD 07/03/2018 in hospital Rosaria Ferries, PA-C   No chief complaint on file.   History of Present Illness Amy Chaney is a 47 y.o. female with a history of stents RCA & LAD, uncontrolled DM, HTN, HLD, obesity  Admitted 12/4-12/01/2018 with STEMI, DES LAD, 100% diagonal treated medically  Amy Chaney presents for ***   Past Medical History:  Diagnosis Date  . Coronary artery disease involving native coronary artery of native heart with angina pectoris ([place]) 12/09/2014   a. s/p PCI of the mid RCA 01/2013 //  b. LHC 9/14: EF 55%, RCA stent 100% occluded, LAD irregularities >>  subacute stent thrombosis, Promus DES PCI distal overlap // c) 09/2017 - Ant STEMI - LAD-D2 PCI - Successful PCI of LAD (3 overlapping DES), unable to restore flow down D2l that was stented as well.  Likely related to downstream dissection, but unable to rewire.  . Daily headache   . Depression   . Dyslipidemia, goal LDL below 70   . History of Doppler ultrasound    carotid bruit >> a. Carotid US 6/17: bilat ICA 1-39%  . Hypertension   . Prolonged Q-T interval on ECG 01/11/2016  . STEMI involving left anterior descending coronary artery ([place]) 09/2017   Severe Medina 1, 1, 1 LAD-D2 lesion (complicated by post PTCA dissection/intramural hematoma) -successful extensive PCI of the LAD but unable to maintain patency of the stented D2.   Marland Kitchen STEMI involving oth coronary artery of inferior wall ([place]) 2014   Secondary to subacute stent thrombosis of RCA stent segment having missed 4 doses of Effient.  . Tobacco abuse 01/11/2016  . Type II diabetes mellitus ([place]) 12/2010   sister and son also diabetic     Past Surgical History:  Procedure Laterality Date  . CHOLECYSTECTOMY  ~ 2000  . CORONARY STENT INTERVENTION N/A 10/10/2017   Procedure: CORONARY STENT  INTERVENTION;  Surgeon: Leonie Man, MD;  Location: [facility];  Service: Cardiovascular; LAD PCI: STENT SYNERGY DES 3.5X32 (p),STENT SYNERGY DES 3.5X 8 (m), STENT SYNERGY DES 3.5X28 (d).  D2 PCI: STENT SYNERGY DES 2.5X24.  (UNABL TO REWIRE & EXPAND - TO OF DIAG AT END OF CASE)  . CORONARY STENT INTERVENTION N/A 07/03/2018   Procedure: CORONARY STENT INTERVENTION;  Surgeon: Leonie Man, MD;  Location: [facility];  Service: Cardiovascular;  Laterality: N/A;  . LEFT HEART CATH AND CORONARY ANGIOGRAPHY N/A 10/10/2017   Procedure: LEFT HEART CATH AND CORONARY ANGIOGRAPHY;  Surgeon: Leonie Man, MD;  Location: [place] [facility];  Service: Cardiovascular;  Laterality: N/A; -patent RCA stents with mild in-stent restenosis. [place] 1,1,1 mLD-D2 15% (complicated by dissection/intramural thrombus)  . LEFT HEART CATH AND CORONARY ANGIOGRAPHY N/A 07/03/2018   Procedure: LEFT HEART CATH AND CORONARY ANGIOGRAPHY;  Surgeon: Leonie Man, MD;  Location: [facility];  Service: Cardiovascular;  Laterality: N/A;  . LEFT HEART CATHETERIZATION WITH CORONARY ANGIOGRAM N/A 02/25/2013   Procedure: LEFT HEART CATHETERIZATION WITH CORONARY ANGIOGRAM;  Surgeon: Laverda Page, MD;  Location: [facility] CATH LAB;  Service: Cardiovascular;; CTO m RCA; otherwise normal coronaries  . LEFT HEART CATHETERIZATION WITH CORONARY ANGIOGRAM N/A 04/01/2013   Procedure: LEFT HEART CATHETERIZATION WITH CORONARY ANGIOGRAM;  Surgeon: Laverda Page, MD;  Location: [facility] CATH LAB;  Service:  Cardiovascular: 100% occlusion of distal RCA stent (subacute stent thrombosis -  secondary to stopping Effient).  Overlapping DES PCI  . NM MYOVIEW LTD  12/2015    EF 38%.  Hypertensive response to exercise (231/132 mmHg).  INTERMEDIATE RISK due to -diffuse hypokinesis and reduced EF.  No ischemia or infarction.  Marland Kitchen PERCUTANEOUS CORONARY STENT INTERVENTION (PCI-S)  04/01/2013   Procedure: PERCUTANEOUS CORONARY STENT INTERVENTION  (PCI-S);  Surgeon: Laverda Page, MD;  Location: [facility] CATH LAB;  Service: Cardiovascular;; PCI of distal RCA stent subacute thrombosis: Promus Premier DES 3.0 mm x 20 mm.  Marland Kitchen PERCUTANEOUS CORONARY STENT INTERVENTION (PCI-S)  02/25/2013   Procedure: PERCUTANEOUS CORONARY STENT INTERVENTION (PCI-S);  Surgeon: Laverda Page, MD;  Location: [facility] CATH LAB;  RCA CTO PCI with overlapping Promus DES: 3.0 mm x 38 mm, 3.5 mm x 5m  . TRANSTHORACIC ECHOCARDIOGRAM  12/2015   mild LVH, EF 55-60%, no RWMA, Gr 1 DD, mild MR  . TRANSTHORACIC ECHOCARDIOGRAM  09/2017   In setting of anterior STEMI:  EF 35% with mild concentric hypertrophy.  GRII DD.  Anteroseptal, anterior and anterolateral walls hypokinetic.  Moderate MR.  . TUBAL LIGATION  1994    Current Outpatient Medications  Medication Sig Dispense Refill  . aspirin EC 81 MG tablet Take 1 tablet (81 mg total) by mouth daily. 90 tablet 3  . atorvastatin (LIPITOR) 80 MG tablet Take 1 tablet (80 mg total) by mouth daily. 30 tablet 2  . blood glucose meter kit and supplies Dispense based on patient and insurance preference. Use up to four times daily as directed. (FOR ICD-10 E10.9, E11.9). 1 each 0  . Blood Glucose Monitoring Suppl (TRUE METRIX METER) DEVI 1 kit by Does not apply route 4 (four) times daily. 1 Device 0  . carvedilol (COREG) 3.125 MG tablet Take 1 tablet (3.125 mg total) by mouth 2 (two) times daily. 60 tablet 2  . glipiZIDE (GLUCOTROL) 10 MG tablet Take 1 tablet (10 mg total) by mouth 2 (two) times daily before a meal. 60 tablet 6  . glucose blood (TRUE METRIX BLOOD GLUCOSE TEST) test strip Use as instructed 100 each 12  . Insulin Glargine (LANTUS SOLOSTAR) 100 UNIT/ML Solostar Pen Inject 25 Units into the skin daily. 30 mL 3  . lisinopril (PRINIVIL,ZESTRIL) 10 MG tablet Take 1 tablet (10 mg total) by mouth daily. 30 tablet 6  . nitroGLYCERIN (NITROSTAT) 0.4 MG SL tablet Place 1 tablet (0.4 mg total) under the tongue every 5 (five) minutes  as needed for chest pain. Reported on 11/25/2015 25 tablet 3  . ticagrelor (BRILINTA) 90 MG TABS tablet Take 1 tablet (90 mg total) by mouth 2 (two) times daily. 60 tablet 11  . TRUEPLUS LANCETS 28G MISC 28 g by Does not apply route 4 (four) times daily. 120 each 2   No current facility-administered medications for this visit.     Allergies:   Patient has no known allergies.    Social History:  The patient  reports that she has been smoking cigarettes. She has a 11.00 pack-year smoking history. She has never used smokeless tobacco. She reports current alcohol use. She reports current drug use. Drug: Marijuana.   Family History:  The patient's family history includes Diabetes in her mother, sister, sister, son, and son.  She indicated that her mother is deceased. She indicated that her father is deceased. She indicated that both of her sisters are alive. She indicated that her brother is alive.  ROS:  Please see the history of present illness. All other systems are reviewed and negative.    PHYSICAL EXAM: VS:  LMP 10/20/2015  , BMI There is no height or weight on file to calculate BMI. GEN: Well nourished, well developed, female in no acute distress HEENT: normal for age  Neck: no JVD, no carotid bruit, no masses Cardiac: RRR; no murmur, no rubs, or gallops Respiratory:  clear to auscultation bilaterally, normal work of breathing GI: soft, nontender, nondistended, + BS MS: no deformity or atrophy; no edema; distal pulses are 2+ in all 4 extremities  Skin: warm and dry, no rash Neuro:  Strength and sensation are intact Psych: euthymic mood, full affect   EKG:  EKG {ACTION; IS/IS PIR:[id number]} ordered today. The ekg ordered today demonstrates ***  Left Cardiac Catheterization 07/03/2018:  Previously placed Prox LAD to Mid LAD drug eluting stents are widely patent up until the distal edge..  Mid LAD lesion is 100% stenosed at the distal edge of the most distal stent.  A  drug-eluting stent was successfully placed using a STENT SYNERGY DES 3X12.  Post intervention, there is a 0% residual stenosis.  Balloon angioplasty was performed in the apical vessel just to help move distal embolization -allowing for flow down to the inferoapex.Colon Flattery 2nd Diag lesion is 100% stenosed. Known occluded stent from previous PCI  Prox RCA to Mid RCA stents are 5% stenosed.  Mid RCA to Dist RCA lesion is 40% stenosed.  Ost 2nd Mrg to 2nd Mrg lesion is 65% stenosed.  There is moderate to severe left ventricular systolic dysfunction.  LV end diastolic pressure is normal.  The left ventricular ejection fraction is 25-35% by visual estimate.  There is trivial (1+) mitral regurgitation.  There is no aortic valve stenosis.  Post intervention, there is a 0% residual stenosis.  Mid LAD lesion is 100% stenosed.  Summary:  Distal stent edge 100% thrombosis (in the mid LAD) as culprit for anterior STEMI: Treated with PTCA and overlapping DES stent (synergy 3.0 mm x 12 mm postdilated to 3.6 mm in the overlap and 3.3 mm distally.  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 (EF roughly 30 to 35%)  Recommendation:  Admit to CVICU for ongoing care. Will run Aggrastat for 3 hours post PCI.  Long-term dual endplate therapy. Currently aspirin and Brilinta, had been on Effient, but did not afford. Would be okay to use Effient, Brilinta or Plavix as long she will be on it. Uninterrupted for 1 year, would be okay to stop Plavix for 3 months if on Effient or Brilinta, at 6 months if on Plavix.  Restart home medications including carvedilol, lisinopril, atorvastatin  Sliding scale insulin for now. Check A1c.  Check 2D echo to better assess EF.  Would not anticipate fast track discharge based on anterior MI with reduced EF.  Case management  or social work consultation to help ensure that she is able to get her medications and not miss doses. Needs lots of education.  Intervention    _____________  Echocardiogram 07/04/2018: Study Conclusions: - Left ventricle: The cavity size was normal. There was mild concentric hypertrophy. Systolic function was moderately reduced. The estimated ejection fraction was in the range of 35% to 40%. Hypokinesis of all of the apical segments. Features are consistent with a pseudonormal left ventricular filling pattern, with concomitant abnormal relaxation and increased filling  pressure (grade 2 diastolic dysfunction). Doppler parameters are consistent with elevated ventricular end-diastolic filling pressure. - Mitral valve: There was mild regurgitation. - Left atrium: The atrium was mildly dilated. - Right ventricle: Systolic function was normal. - Tricuspid valve: There was trivial regurgitation. - Pulmonary arteries: Systolic pressure was within the normal range. - Inferior vena cava: The vessel was normal in size. - Pericardium, extracardiac: There was no pericardial effusion. There was a left pleural effusion.  Impressions: - Since the last study on 10/11/2017 LVEF has improved from 30% to 35-40% with residual hypokinesis in the apical segments. There is no thrombus on the Definity ecchocontrast images.  Recent Labs: 10/10/2017: ALT 15; TSH 0.672 10/12/2017: Magnesium 1.8 07/05/2018: BUN 12; Creatinine, Ser 0.74; Hemoglobin 11.8; Platelets 303; Potassium 3.5; Sodium 137  CBC    Component Value Date/Time   WBC 14.0 (H) 07/05/2018 0330   RBC 4.54 07/05/2018 0330   HGB 11.8 (L) 07/05/2018 0330   HCT 38.0 07/05/2018 0330   PLT 303 07/05/2018 0330   MCV 83.7 07/05/2018 0330   MCH 26.0 07/05/2018 0330   MCHC 31.1 07/05/2018 0330   RDW 14.2 07/05/2018 0330   LYMPHSABS 3.9 10/14/2017 0349   MONOABS 0.6 10/14/2017 0349   EOSABS 0.4 10/14/2017 0349    BASOSABS 0.1 10/14/2017 0349   CMP Latest Ref Rng & Units 07/05/2018 07/04/2018 07/03/2018  Glucose 70 - 99 mg/dL 217(H) 375(H) 352(H)  BUN 6 - 20 mg/dL _0 Creatinine 0.44 - 1.00 mg/dL 0.74 0.69 0.40(L)  Sodium 135 - 145 mmol/L 137 134(L) 136  Potassium 3.5 - 5.1 mmol/L 3.5 3.9 3.7  Chloride 98 - 111 mmol/L 104 102 101  CO2 22 - 32 mmol/L 22 22 -  Calcium 8.9 - 10.3 mg/dL 8.7(L) 8.7(L) -  Total Protein 6.5 - 8.1 g/dL - - -  Total Bilirubin 0.3 - 1.2 mg/dL - - -  Alkaline Phos 38 - 126 U/L - - -  AST 15 - 41 U/L - - -  ALT 14 - 54 U/L - - -     Lipid Panel Lab Results  Component Value Date   CHOL 164 07/05/2018   HDL 39 (L) 07/05/2018   LDLCALC 110 (H) 07/05/2018   TRIG 73 07/05/2018   CHOLHDL 4.2 07/05/2018      Wt Readings from Last 3 Encounters:  07/09/18 230 lb 12.8 oz (104.7 kg)  07/06/18 226 lb 14.4 oz (102.9 kg)  04/02/18 231 lb 12.8 oz (105.1 kg)     Other studies Reviewed: Additional studies/ records that were reviewed today include: ***.  ASSESSMENT AND PLAN:  1.  ***   Current medicines are reviewed at length with the patient today.  The patient {ACTIONS; HAS/DOES NOT HAVE:19233} concerns regarding medicines.  The following changes have been made:  {PLAN; NO CHANGE:13088:s}  Labs/ tests ordered today include: *** No orders of the defined types were placed in this encounter.    Disposition:   FU with Glenetta Hew, MD  Signed, Rosaria Ferries, PA-C  07/15/2018 8:05 AM    [place] Phone: [phone]; Fax: [phone]

## 2018-07-15 NOTE — Telephone Encounter (Signed)
Error

## 2018-07-16 ENCOUNTER — Telehealth (HOSPITAL_COMMUNITY): Payer: Self-pay

## 2018-07-16 NOTE — Telephone Encounter (Signed)
Attempted to contact pt, unable to reach pt or leave a voicemail.  Mailed letter

## 2018-07-25 ENCOUNTER — Inpatient Hospital Stay: Payer: Self-pay | Admitting: Family Medicine

## 2018-08-01 ENCOUNTER — Ambulatory Visit (INDEPENDENT_AMBULATORY_CARE_PROVIDER_SITE_OTHER): Payer: Self-pay | Admitting: Physician Assistant

## 2018-08-01 ENCOUNTER — Encounter: Payer: Self-pay | Admitting: Physician Assistant

## 2018-08-01 VITALS — BP 130/76 | HR 60 | Ht 69.0 in | Wt 234.8 lb

## 2018-08-01 DIAGNOSIS — I5042 Chronic combined systolic (congestive) and diastolic (congestive) heart failure: Secondary | ICD-10-CM

## 2018-08-01 DIAGNOSIS — Z716 Tobacco abuse counseling: Secondary | ICD-10-CM

## 2018-08-01 DIAGNOSIS — I2102 ST elevation (STEMI) myocardial infarction involving left anterior descending coronary artery: Secondary | ICD-10-CM

## 2018-08-01 DIAGNOSIS — E1169 Type 2 diabetes mellitus with other specified complication: Secondary | ICD-10-CM

## 2018-08-01 DIAGNOSIS — E785 Hyperlipidemia, unspecified: Secondary | ICD-10-CM

## 2018-08-01 MED FILL — BRILINTA 90 MG TABLET: 90 | 30 days supply | Qty: 60 | Fill #0

## 2018-08-01 MED FILL — LISINOPRIL 10 MG TABS: 10 | 30 days supply | Qty: 30 | Fill #0

## 2018-08-01 MED FILL — CARVEDILOL 3.125 MG TABLET: 3.125 | 30 days supply | Qty: 60 | Fill #0

## 2018-08-01 MED FILL — ATORVASTATIN 80 MG TABLET: 80 | 30 days supply | Qty: 30 | Fill #0

## 2018-08-01 MED FILL — TRUE METRIX TEST STRIP: 25 days supply | Qty: 100 | Fill #2

## 2018-08-01 MED FILL — glipiZIDE 10 MG TABS: 10 | 30 days supply | Qty: 60 | Fill #0

## 2018-08-01 NOTE — Patient Instructions (Addendum)
Medication Instructions:  The current medical regimen is effective;  continue present plan and medications.  If you need a refill on your cardiac medications before your next appointment, please call your pharmacy.   Follow-Up: At [facility], you and your health needs are our priority.  As part of our continuing mission to provide you with exceptional heart care, we have created designated Provider Care Teams.  These Care Teams include your primary Cardiologist (physician) and Advanced Practice Providers (APPs -  Physician Assistants and Nurse Practitioners) who all work together to provide you with the care you need, when you need it. You will need a follow up appointment in 3 months. You may see Glenetta Hew, MD or one of the following Advanced Practice Providers on your designated Care Team:   Rosaria Ferries, PA-C . Jory Sims, DNP, ANP  Any Other Special Instructions Will Be Listed Below (If Applicable). You will receive a call from our health coach Neil Crouch regarding the help with medications.   You will receive a call with an appointment for Cardiac Rehab.

## 2018-08-01 NOTE — Progress Notes (Signed)
Cardiology Office Note   Date:  08/01/2018   ID:  Amy Chaney, DOB Jul 02, 1971, MRN [id number]  PCP:  Amy Rakes, MD Cardiologist:  Amy Hew, MD 07/03/2018 in hospital Amy Ferries, PA-C   No chief complaint on file.   History of Present Illness: Amy Chaney is a 48 y.o. female with a history of LAD (x 3 in 09/2017), Diag (occluded 09/2017) & RCA (2014) stents, uncontrolled DM, HTN, HLD, morbid obesity, med noncompliance, tob use  Admitted 12/4-12/01/2018 for STEMI involving the LAD, s/p overlapping DES, ICM w/ EF 35-40% echo, A1c 9.9% Canceled initial f/u appt, but saw IM on 12/10  Amy Chaney presents for cardiology follow up.  Not contacted by cardiac rehab yet.   Gets SOB w/ exertion, not at rest.  No LE edema, no orthopnea or PND.   No chest pain.   Has run out of test strips, hopes her daughter will help her get them.   Still smokes 1-2 cigs/day. Is trying to quit completely. She was eating sweets to keep from smoking.   She snores, denies PND, feels rested upon waking.    Past Medical History:  Diagnosis Date  . Coronary artery disease involving native coronary artery of native heart with angina pectoris ([place]) 12/09/2014   a. s/p PCI of the mid RCA 01/2013 //  b. LHC 9/14: EF 55%, RCA stent 100% occluded, LAD irregularities >>  subacute stent thrombosis, Promus DES PCI distal overlap // c) 09/2017 - Ant STEMI - LAD-D2 PCI - Successful PCI of LAD (3 overlapping DES), unable to restore flow down D2l that was stented as well.  Likely related to downstream dissection, but unable to rewire.  . Daily headache   . Depression   . Dyslipidemia, goal LDL below 70   . History of Doppler ultrasound    carotid bruit >> a. Carotid US 6/17: bilat ICA 1-39%  . Hypertension   . Prolonged Q-T interval on ECG 01/11/2016  . STEMI involving left anterior descending coronary artery ([place]) 09/2017   Severe [place] 1, 1, 1 LAD-D2 lesion (complicated by post PTCA  dissection/intramural hematoma) -successful extensive PCI of the LAD but unable to maintain patency of the stented D2.   Marland Kitchen STEMI involving oth coronary artery of inferior wall ([place]) 2014   Secondary to subacute stent thrombosis of RCA stent segment having missed 4 doses of Effient.  . Tobacco abuse 01/11/2016  . Type II diabetes mellitus ([place]) 12/2010   sister and son also diabetic     Past Surgical History:  Procedure Laterality Date  . CHOLECYSTECTOMY  ~ 2000  . CORONARY STENT INTERVENTION N/A 10/10/2017   Procedure: CORONARY STENT INTERVENTION;  Surgeon: Leonie Man, MD;  Location: [place] [facility];  Service: Cardiovascular; LAD PCI: STENT SYNERGY DES 3.5X32 (p),STENT SYNERGY DES 3.5X 8 (m), STENT SYNERGY DES 3.5X28 (d).  D2 PCI: STENT SYNERGY DES 2.5X24.  (UNABL TO REWIRE & EXPAND - TO OF DIAG AT END OF CASE)  . CORONARY STENT INTERVENTION N/A 07/03/2018   Procedure: CORONARY STENT INTERVENTION;  Surgeon: Leonie Man, MD;  Location: [place] [facility];  Service: Cardiovascular;  Laterality: N/A;  . LEFT HEART CATH AND CORONARY ANGIOGRAPHY N/A 10/10/2017   Procedure: LEFT HEART CATH AND CORONARY ANGIOGRAPHY;  Surgeon: Leonie Man, MD;  Location: [place] [facility];  Service: Cardiovascular;  Laterality: N/A; -patent RCA stents with mild in-stent restenosis. [place] 1,1,1 mLD-D2 23% (complicated by dissection/intramural thrombus)  .  LEFT HEART CATH AND CORONARY ANGIOGRAPHY N/A 07/03/2018   Procedure: LEFT HEART CATH AND CORONARY ANGIOGRAPHY;  Surgeon: Leonie Man, MD;  Location: [place] [facility];  Service: Cardiovascular;  Laterality: N/A;  . LEFT HEART CATHETERIZATION WITH CORONARY ANGIOGRAM N/A 02/25/2013   Procedure: LEFT HEART CATHETERIZATION WITH CORONARY ANGIOGRAM;  Surgeon: Laverda Page, MD;  Location: [facility] CATH LAB;  Service: Cardiovascular;; CTO m RCA; otherwise normal coronaries  . LEFT HEART CATHETERIZATION WITH CORONARY ANGIOGRAM N/A 04/01/2013   Procedure: LEFT  HEART CATHETERIZATION WITH CORONARY ANGIOGRAM;  Surgeon: Laverda Page, MD;  Location: [facility] CATH LAB;  Service: Cardiovascular: 100% occlusion of distal RCA stent (subacute stent thrombosis -  secondary to stopping Effient).  Overlapping DES PCI  . NM MYOVIEW LTD  12/2015    EF 38%.  Hypertensive response to exercise (231/132 mmHg).  INTERMEDIATE RISK due to -diffuse hypokinesis and reduced EF.  No ischemia or infarction.  Marland Kitchen PERCUTANEOUS CORONARY STENT INTERVENTION (PCI-S)  04/01/2013   Procedure: PERCUTANEOUS CORONARY STENT INTERVENTION (PCI-S);  Surgeon: Laverda Page, MD;  Location: [facility] CATH LAB;  Service: Cardiovascular;; PCI of distal RCA stent subacute thrombosis: Promus Premier DES 3.0 mm x 20 mm.  Marland Kitchen PERCUTANEOUS CORONARY STENT INTERVENTION (PCI-S)  02/25/2013   Procedure: PERCUTANEOUS CORONARY STENT INTERVENTION (PCI-S);  Surgeon: Laverda Page, MD;  Location: [facility] CATH LAB;  RCA CTO PCI with overlapping Promus DES: 3.0 mm x 38 mm, 3.5 mm x 73m  . TRANSTHORACIC ECHOCARDIOGRAM  12/2015   mild LVH, EF 55-60%, no RWMA, Gr 1 DD, mild MR  . TRANSTHORACIC ECHOCARDIOGRAM  09/2017   In setting of anterior STEMI:  EF 35% with mild concentric hypertrophy.  GRII DD.  Anteroseptal, anterior and anterolateral walls hypokinetic.  Moderate MR.  . TUBAL LIGATION  1994    Current Outpatient Medications  Medication Sig Dispense Refill  . aspirin EC 81 MG tablet Take 1 tablet (81 mg total) by mouth daily. 90 tablet 3  . atorvastatin (LIPITOR) 80 MG tablet Take 1 tablet (80 mg total) by mouth daily. 30 tablet 2  . blood glucose meter kit and supplies Dispense based on patient and insurance preference. Use up to four times daily as directed. (FOR ICD-10 E10.9, E11.9). 1 each 0  . Blood Glucose Monitoring Suppl (TRUE METRIX METER) DEVI 1 kit by Does not apply route 4 (four) times daily. 1 Device 0  . carvedilol (COREG) 3.125 MG tablet Take 1 tablet (3.125 mg total) by mouth 2 (two) times daily. 60 tablet  2  . glipiZIDE (GLUCOTROL) 10 MG tablet Take 1 tablet (10 mg total) by mouth 2 (two) times daily before a meal. 60 tablet 6  . glucose blood (TRUE METRIX BLOOD GLUCOSE TEST) test strip Use as instructed 100 each 12  . Insulin Glargine (LANTUS SOLOSTAR) 100 UNIT/ML Solostar Pen Inject 25 Units into the skin daily. 30 mL 3  . lisinopril (PRINIVIL,ZESTRIL) 10 MG tablet Take 1 tablet (10 mg total) by mouth daily. 30 tablet 6  . nitroGLYCERIN (NITROSTAT) 0.4 MG SL tablet Place 1 tablet (0.4 mg total) under the tongue every 5 (five) minutes as needed for chest pain. Reported on 11/25/2015 25 tablet 3  . ticagrelor (BRILINTA) 90 MG TABS tablet Take 1 tablet (90 mg total) by mouth 2 (two) times daily. 60 tablet 11  . TRUEPLUS LANCETS 28G MISC 28 g by Does not apply route 4 (four) times daily. 120 each 2   No current facility-administered medications for this  visit.     Allergies:   Patient has no known allergies.    Social History:  The patient  reports that she has been smoking cigarettes. She has been smoking about 0.00 packs per day for the past 22.00 years. She has never used smokeless tobacco. She reports current alcohol use. She reports current drug use. Drug: Marijuana.   Family History:  The patient's family history includes Diabetes in her mother, sister, sister, son, and son.  She indicated that her mother is deceased. She indicated that her father is deceased. She indicated that both of her sisters are alive. She indicated that her brother is alive.   ROS:  Please see the history of present illness. All other systems are reviewed and negative.    PHYSICAL EXAM: VS:  BP 130/76   Pulse 60   Ht _0  (1.753 m)   Wt 234 lb 12.8 oz (106.5 kg)   LMP 10/20/2015   BMI 34.67 kg/m  , BMI Body mass index is 34.67 kg/m. GEN: Well nourished, well developed, female in no acute distress HEENT: normal for age  Neck: no JVD, no carotid bruit, no masses Cardiac: RRR; soft murmur, no rubs, or  gallops Respiratory:  clear to auscultation bilaterally, normal work of breathing GI: soft, nontender, nondistended, + BS MS: no deformity or atrophy; no edema; distal pulses are 2+ in all 4 extremities  Skin: warm and dry, no rash Neuro:  Strength and sensation are intact Psych: euthymic mood, full affect   EKG:  EKG is ordered today. The ekg ordered today demonstrates SR, HR 60, deep T wave inversions lateral leads, worse than 12/05  Echocardiogram 07/04/2018: Study Conclusions: - Left ventricle: The cavity size was normal. There was mild concentric hypertrophy. Systolic function was moderately reduced. The estimated ejection fraction was in the range of 35% to 40%. Hypokinesis of all of the apical segments. Features are consistent with a pseudonormal left ventricular filling pattern, with concomitant abnormal relaxation and increased filling pressure (grade 2 diastolic dysfunction). Doppler parameters are consistent with elevated ventricular end-diastolic filling pressure. - Mitral valve: There was mild regurgitation. - Left atrium: The atrium was mildly dilated. - Right ventricle: Systolic function was normal. - Tricuspid valve: There was trivial regurgitation. - Pulmonary arteries: Systolic pressure was within the normal range. - Inferior vena cava: The vessel was normal in size. - Pericardium, extracardiac: There was no pericardial effusion. There was a left pleural effusion.  Impressions: - Since the last study on 10/11/2017 LVEF has improved from 30% to 35-40% with residual hypokinesis in the apical segments. There is no thrombus on the Definity ecchocontrast images.  Left Cardiac Catheterization 07/03/2018:  Previously placed Prox LAD to Mid LAD drug eluting stents are widely patent up until the distal edge..  Mid LAD lesion is 100% stenosed at the distal edge of the most distal stent.  A drug-eluting stent was successfully placed using a  STENT SYNERGY DES 3X12.  Post intervention, there is a 0% residual stenosis.  Balloon angioplasty was performed in the apical vessel just to help move distal embolization -allowing for flow down to the inferoapex.Colon Flattery 2nd Diag lesion is 100% stenosed. Known occluded stent from previous PCI  Prox RCA to Mid RCA stents are 5% stenosed.  Mid RCA to Dist RCA lesion is 40% stenosed.  Ost 2nd Mrg to 2nd Mrg lesion is 65% stenosed.  There is moderate to severe left ventricular systolic dysfunction.  LV end diastolic pressure is normal.  The left ventricular ejection fraction is 25-35% by visual estimate.  There is trivial (1+) mitral regurgitation.  There is no aortic valve stenosis.  Post intervention, there is a 0% residual stenosis.  Mid LAD lesion is 100% stenosed.  Summary:  Distal stent edge 100% thrombosis (in the mid LAD) as culprit for anterior STEMI: Treated with PTCA and overlapping DES stent (synergy 3.0 mm x 12 mm postdilated to 3.6 mm in the overlap and 3.3 mm distally.  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 (EF roughly 30 to 35%)  Recommendation:  Admit to CVICU for ongoing care. Will run Aggrastat for 3 hours post PCI.  Long-term dual endplate therapy. Currently aspirin and Brilinta, had been on Effient, but did not afford. Would be okay to use Effient, Brilinta or Plavix as long she will be on it. Uninterrupted for 1 year, would be okay to stop Plavix for 3 months if on Effient or Brilinta, at 6 months if on Plavix.  Restart home medications including carvedilol, lisinopril, atorvastatin  Sliding scale insulin for now. Check A1c.  Check 2D echo to better assess EF.  Would not anticipate fast track discharge based on anterior MI with reduced EF.  Case management or social work consultation to help ensure that she  is able to get her medications and not miss doses. Needs lots of education. _______________   Recent Labs: 10/10/2017: ALT 15; TSH 0.672 10/12/2017: Magnesium 1.8 07/05/2018: BUN 12; Creatinine, Ser 0.74; Hemoglobin 11.8; Platelets 303; Potassium 3.5; Sodium 137  CBC    Component Value Date/Time   WBC 14.0 (H) 07/05/2018 0330   RBC 4.54 07/05/2018 0330   HGB 11.8 (L) 07/05/2018 0330   HCT 38.0 07/05/2018 0330   PLT 303 07/05/2018 0330   MCV 83.7 07/05/2018 0330   MCH 26.0 07/05/2018 0330   MCHC 31.1 07/05/2018 0330   RDW 14.2 07/05/2018 0330   LYMPHSABS 3.9 10/14/2017 0349   MONOABS 0.6 10/14/2017 0349   EOSABS 0.4 10/14/2017 0349   BASOSABS 0.1 10/14/2017 0349   CMP Latest Ref Rng & Units 07/05/2018 07/04/2018 07/03/2018  Glucose 70 - 99 mg/dL 217(H) 375(H) 352(H)  BUN 6 - 20 mg/dL _0 Creatinine 0.44 - 1.00 mg/dL 0.74 0.69 0.40(L)  Sodium 135 - 145 mmol/L 137 134(L) 136  Potassium 3.5 - 5.1 mmol/L 3.5 3.9 3.7  Chloride 98 - 111 mmol/L 104 102 101  CO2 22 - 32 mmol/L 22 22 -  Calcium 8.9 - 10.3 mg/dL 8.7(L) 8.7(L) -  Total Protein 6.5 - 8.1 g/dL - - -  Total Bilirubin 0.3 - 1.2 mg/dL - - -  Alkaline Phos 38 - 126 U/L - - -  AST 15 - 41 U/L - - -  ALT 14 - 54 U/L - - -     Lipid Panel Lab Results  Component Value Date   CHOL 164 07/05/2018   HDL 39 (L) 07/05/2018   LDLCALC 110 (H) 07/05/2018   TRIG 73 07/05/2018   CHOLHDL 4.2 07/05/2018      Wt Readings from Last 3 Encounters:  08/01/18 234 lb 12.8 oz (106.5 kg)  07/09/18 230 lb 12.8 oz (104.7 kg)  07/06/18 226 lb 14.4 oz (102.9 kg)     Other studies Reviewed: Additional studies/ records that were reviewed today include: office notes, hospital records and testing.  ASSESSMENT AND PLAN:  1.  STEMI  s/p DES LAD:  -Patient is compliant with medications including Brilinta, but does not know how much the Brilinta will cost her - Social work consult to make sure she can afford her medications, she may need  assistance - Continue ASA, high-dose statin, beta-blocker, lisinopril. -Because of dyspnea on exertion and some fatigue, will not try to increase her beta-blocker.  2.  Dyslipidemia: -Continue high-dose statin -Recheck in 3 months or per IM  3.  Tobacco use: - Counseled on total cessation > 5".  She is trying, but tends to eat sweets to keep from smoking -Encouraged her to try to come up with other methods to keep from smoking  4.  Abnormal ECG: - Her ECG has much deeper lateral T wave inversions and were seen on her procedure ECG -Reviewed with Dr. [place], felt 2nd to evolving MI changes, follow  5.  Chronic combined CHF, dyspnea on exertion: A walk test was performed in the office.  She did several 100 feet and her oxygen level never dropped below 96%. - Feel she may be deconditioned, encouraged her to increase her activity and to follow-up with cardiac rehab - Her weight is up 4 pounds on our scales, but she does not have CHF on exam.  Feel that this is actual weight gain versus clothing etc. - She is on a beta-blocker and ACE inhibitor, but not a diuretic.  Currently does not need one.  6.  Social issues: - She is currently not working and is now on [place] although there is an application in.  She does not know the next step in that process.  We will contact social work to help her follow-up on that. - She is also having problems affording her medications, social work may be able to help with that as well.   Current medicines are reviewed at length with the patient today.  The patient has concerns regarding medicines.  Concerns were addressed  The following changes have been made:  no change  Labs/ tests ordered today include:   Orders Placed This Encounter  Procedures  . AMB referral to cardiac rehabilitation  . EKG 12-Lead     Disposition:   FU with Amy Hew, MD  Signed, Amy Ferries, PA-C  08/01/2018 9:34 AM    [place] Phone:  [phone]; Fax: [phone]

## 2018-08-12 ENCOUNTER — Telehealth: Payer: Self-pay

## 2018-08-12 NOTE — Telephone Encounter (Signed)
Followed up with patient on rx assistance referral. Pt currently has no insurance coverage. Has not heard back from medicaid. Is also looking to complete application for blue card

## 2018-08-21 ENCOUNTER — Ambulatory Visit: Payer: Self-pay | Attending: Family Medicine | Admitting: Family Medicine

## 2018-08-21 ENCOUNTER — Encounter: Payer: Self-pay | Admitting: Family Medicine

## 2018-08-21 VITALS — BP 160/82 | HR 58 | Temp 97.9°F | Ht 69.0 in | Wt 234.0 lb

## 2018-08-21 DIAGNOSIS — Z0001 Encounter for general adult medical examination with abnormal findings: Secondary | ICD-10-CM

## 2018-08-21 DIAGNOSIS — Z0000 Encounter for general adult medical examination without abnormal findings: Secondary | ICD-10-CM

## 2018-08-21 DIAGNOSIS — Z1239 Encounter for other screening for malignant neoplasm of breast: Secondary | ICD-10-CM

## 2018-08-21 DIAGNOSIS — Z124 Encounter for screening for malignant neoplasm of cervix: Secondary | ICD-10-CM

## 2018-08-21 DIAGNOSIS — E1165 Type 2 diabetes mellitus with hyperglycemia: Secondary | ICD-10-CM

## 2018-08-21 LAB — GLUCOSE, POCT (MANUAL RESULT ENTRY): POC Glucose: 108 mg/dl — AB (ref 70–99)

## 2018-08-21 NOTE — Patient Instructions (Signed)

## 2018-08-21 NOTE — Progress Notes (Signed)
Subjective:  Patient ID: Amy Chaney, female    DOB: 01-09-1971  Age: 48 y.o. MRN: [id number]  CC: Annual Exam and Gynecologic Exam   HPI Amy Chaney presents for a complete physical exam. Her BP is elevated today but was controlled at her last office visit; she endorses compliance with her medications.  Past Medical History:  Diagnosis Date  . Coronary artery disease involving native coronary artery of native heart with angina pectoris ([place]) 12/09/2014   a. s/p PCI of the mid RCA 01/2013 //  b. LHC 9/14: EF 55%, RCA stent 100% occluded, LAD irregularities >>  subacute stent thrombosis, Promus DES PCI distal overlap // c) 09/2017 - Ant STEMI - LAD-D2 PCI - Successful PCI of LAD (3 overlapping DES), unable to restore flow down D2l that was stented as well.  Likely related to downstream dissection, but unable to rewire.  . Daily headache   . Depression   . Dyslipidemia, goal LDL below 70   . History of Doppler ultrasound    carotid bruit >> a. Carotid US 6/17: bilat ICA 1-39%  . Hypertension   . Prolonged Q-T interval on ECG 01/11/2016  . STEMI involving left anterior descending coronary artery ([place]) 09/2017   Severe Medina 1, 1, 1 LAD-D2 lesion (complicated by post PTCA dissection/intramural hematoma) -successful extensive PCI of the LAD but unable to maintain patency of the stented D2.   Marland Kitchen STEMI involving oth coronary artery of inferior wall ([place]) 2014   Secondary to subacute stent thrombosis of RCA stent segment having missed 4 doses of Effient.  . Tobacco abuse 01/11/2016  . Type II diabetes mellitus ([place]) 12/2010   sister and son also diabetic     Past Surgical History:  Procedure Laterality Date  . CHOLECYSTECTOMY  ~ 2000  . CORONARY STENT INTERVENTION N/A 10/10/2017   Procedure: CORONARY STENT INTERVENTION;  Surgeon: Leonie Man, MD;  Location: [facility];  Service: Cardiovascular; LAD PCI: STENT SYNERGY DES 3.5X32 (p),STENT SYNERGY DES 3.5X 8 (m), STENT  SYNERGY DES 3.5X28 (d).  D2 PCI: STENT SYNERGY DES 2.5X24.  (UNABL TO REWIRE & EXPAND - TO OF DIAG AT END OF CASE)  . CORONARY STENT INTERVENTION N/A 07/03/2018   Procedure: CORONARY STENT INTERVENTION;  Surgeon: Leonie Man, MD;  Location: [facility];  Service: Cardiovascular;  Laterality: N/A;  . LEFT HEART CATH AND CORONARY ANGIOGRAPHY N/A 10/10/2017   Procedure: LEFT HEART CATH AND CORONARY ANGIOGRAPHY;  Surgeon: Leonie Man, MD;  Location: [facility];  Service: Cardiovascular;  Laterality: N/A; -patent RCA stents with mild in-stent restenosis. Medina 1,1,1 mLD-D2 15% (complicated by dissection/intramural thrombus)  . LEFT HEART CATH AND CORONARY ANGIOGRAPHY N/A 07/03/2018   Procedure: LEFT HEART CATH AND CORONARY ANGIOGRAPHY;  Surgeon: Leonie Man, MD;  Location: [facility];  Service: Cardiovascular;  Laterality: N/A;  . LEFT HEART CATHETERIZATION WITH CORONARY ANGIOGRAM N/A 02/25/2013   Procedure: LEFT HEART CATHETERIZATION WITH CORONARY ANGIOGRAM;  Surgeon: Laverda Page, MD;  Location: [facility] CATH LAB;  Service: Cardiovascular;; CTO m RCA; otherwise normal coronaries  . LEFT HEART CATHETERIZATION WITH CORONARY ANGIOGRAM N/A 04/01/2013   Procedure: LEFT HEART CATHETERIZATION WITH CORONARY ANGIOGRAM;  Surgeon: Laverda Page, MD;  Location: [facility] CATH LAB;  Service: Cardiovascular: 100% occlusion of distal RCA stent (subacute stent thrombosis -  secondary to stopping Effient).  Overlapping DES PCI  . NM MYOVIEW LTD  12/2015    EF 38%.  Hypertensive response to exercise (231/132 mmHg).  INTERMEDIATE RISK due to -diffuse hypokinesis and reduced EF.  No ischemia or infarction.  Marland Kitchen PERCUTANEOUS CORONARY STENT INTERVENTION (PCI-S)  04/01/2013   Procedure: PERCUTANEOUS CORONARY STENT INTERVENTION (PCI-S);  Surgeon: Laverda Page, MD;  Location: [facility] CATH LAB;  Service: Cardiovascular;; PCI of distal RCA stent subacute thrombosis: Promus Premier DES 3.0 mm x 20 mm.  Marland Kitchen  PERCUTANEOUS CORONARY STENT INTERVENTION (PCI-S)  02/25/2013   Procedure: PERCUTANEOUS CORONARY STENT INTERVENTION (PCI-S);  Surgeon: Laverda Page, MD;  Location: [facility] CATH LAB;  RCA CTO PCI with overlapping Promus DES: 3.0 mm x 38 mm, 3.5 mm x 3m  . TRANSTHORACIC ECHOCARDIOGRAM  12/2015   mild LVH, EF 55-60%, no RWMA, Gr 1 DD, mild MR  . TRANSTHORACIC ECHOCARDIOGRAM  09/2017   In setting of anterior STEMI:  EF 35% with mild concentric hypertrophy.  GRII DD.  Anteroseptal, anterior and anterolateral walls hypokinetic.  Moderate MR.  . TUBAL LIGATION  1994    No Known Allergies    Outpatient Medications Prior to Visit  Medication Sig Dispense Refill  . aspirin EC 81 MG tablet Take 1 tablet (81 mg total) by mouth daily. 90 tablet 3  . atorvastatin (LIPITOR) 80 MG tablet Take 1 tablet (80 mg total) by mouth daily. 30 tablet 2  . blood glucose meter kit and supplies Dispense based on patient and insurance preference. Use up to four times daily as directed. (FOR ICD-10 E10.9, E11.9). 1 each 0  . Blood Glucose Monitoring Suppl (TRUE METRIX METER) DEVI 1 kit by Does not apply route 4 (four) times daily. 1 Device 0  . carvedilol (COREG) 3.125 MG tablet Take 1 tablet (3.125 mg total) by mouth 2 (two) times daily. 60 tablet 2  . glipiZIDE (GLUCOTROL) 10 MG tablet Take 1 tablet (10 mg total) by mouth 2 (two) times daily before a meal. 60 tablet 6  . glucose blood (TRUE METRIX BLOOD GLUCOSE TEST) test strip Use as instructed 100 each 12  . Insulin Glargine (LANTUS SOLOSTAR) 100 UNIT/ML Solostar Pen Inject 25 Units into the skin daily. 30 mL 3  . lisinopril (PRINIVIL,ZESTRIL) 10 MG tablet Take 1 tablet (10 mg total) by mouth daily. 30 tablet 6  . nitroGLYCERIN (NITROSTAT) 0.4 MG SL tablet Place 1 tablet (0.4 mg total) under the tongue every 5 (five) minutes as needed for chest pain. Reported on 11/25/2015 25 tablet 3  . ticagrelor (BRILINTA) 90 MG TABS tablet Take 1 tablet (90 mg total) by mouth 2  (two) times daily. 60 tablet 11  . TRUEPLUS LANCETS 28G MISC 28 g by Does not apply route 4 (four) times daily. 120 each 2   No facility-administered medications prior to visit.     ROS Review of Systems  Constitutional: Negative for activity change, appetite change and fatigue.  HENT: Negative for congestion, sinus pressure and sore throat.   Eyes: Negative for visual disturbance.  Respiratory: Negative for cough, chest tightness, shortness of breath and wheezing.   Cardiovascular: Negative for chest pain and palpitations.  Gastrointestinal: Negative for abdominal distention, abdominal pain and constipation.  Endocrine: Negative for polydipsia.  Genitourinary: Negative for dysuria and frequency.  Musculoskeletal: Negative for arthralgias and back pain.  Skin: Negative for rash.  Neurological: Negative for tremors, light-headedness and numbness.  Hematological: Does not bruise/bleed easily.  Psychiatric/Behavioral: Negative for agitation and behavioral problems.    Objective:  BP (!) 160/82   Pulse (!) 58   Temp 97.9 F (  36.6 C) (Oral)   Ht 5' 9"  (1.753 m)   Wt 234 lb (106.1 kg)   LMP 10/20/2015   SpO2 100%   BMI 34.56 kg/m   BP/Weight 08/21/2018 08/01/2018 10/09/8116  Systolic BP 867 737 366  Diastolic BP 82 76 80  Wt. (Lbs) 234 234.8 230.8  BMI 34.56 34.67 34.08      Physical Exam Constitutional:      General: She is not in acute distress.    Appearance: She is well-developed. She is not diaphoretic.  HENT:     Head: Normocephalic.     Right Ear: External ear normal.     Left Ear: External ear normal.     Nose: Nose normal.  Eyes:     Conjunctiva/sclera: Conjunctivae normal.     Pupils: Pupils are equal, round, and reactive to light.  Neck:     Musculoskeletal: Normal range of motion.     Vascular: No JVD.  Cardiovascular:     Rate and Rhythm: Normal rate and regular rhythm.     Heart sounds: Normal heart sounds. No murmur. No gallop.   Pulmonary:      Effort: Pulmonary effort is normal. No respiratory distress.     Breath sounds: Normal breath sounds. No wheezing or rales.  Chest:     Chest wall: No tenderness.     Breasts:        Right: Normal. No mass or tenderness.        Left: Normal. No mass or tenderness.  Abdominal:     General: Bowel sounds are normal. There is no distension.     Palpations: Abdomen is soft. There is no mass.     Tenderness: There is no abdominal tenderness.  Musculoskeletal: Normal range of motion.        General: No tenderness.  Skin:    General: Skin is warm and dry.  Neurological:     Mental Status: She is alert and oriented to person, place, and time.     Deep Tendon Reflexes: Reflexes are normal and symmetric.      Assessment & Plan:   1. Uncontrolled type 2 diabetes mellitus with hyperglycemia ([place]) Regimen adjusted at last visit - POCT glucose (manual entry)  2. Annual physical exam Counseled on 150 minutes of exercise per week, healthy eating (including decreased daily intake of saturated fats, cholesterol, added sugars, sodium), STI prevention, routine healthcare maintenance.   3. Screening for breast cancer - MM Digital Screening; Future  4. Screening for cervical cancer - Cytology - PAP([place])   No orders of the defined types were placed in this encounter.   Follow-up: Return in about 3 months (around 11/20/2018) for follow up of chronic medical conditions.   [name] MD

## 2018-08-23 ENCOUNTER — Telehealth (HOSPITAL_COMMUNITY): Payer: Self-pay

## 2018-08-23 ENCOUNTER — Other Ambulatory Visit: Payer: Self-pay

## 2018-08-23 MED ORDER — TETANUS-DIPHTH-ACELL PERTUSSIS 5-2.5-18.5 LF-MCG/0.5 IM SUSP: 0.5000 mL | INTRAMUSCULAR | 0 refills | Status: DC

## 2018-08-23 NOTE — Telephone Encounter (Signed)
Called and spoke with pt in regards to CR, pt stated she has not been approved for insurance at this time and will not be able to participate. I did adv pt of our CR maintenance.  Closed referral

## 2018-08-26 ENCOUNTER — Other Ambulatory Visit: Payer: Self-pay | Admitting: Family Medicine

## 2018-08-26 DIAGNOSIS — R87610 Atypical squamous cells of undetermined significance on cytologic smear of cervix (ASC-US): Secondary | ICD-10-CM

## 2018-08-26 DIAGNOSIS — R87810 Cervical high risk human papillomavirus (HPV) DNA test positive: Principal | ICD-10-CM

## 2018-08-26 LAB — CYTOLOGY - PAP
Diagnosis: UNDETERMINED — AB
HPV 16/18/45 genotyping: POSITIVE — AB
HPV: DETECTED — AB

## 2018-08-26 MED ORDER — METRONIDAZOLE 500 MG PO TABS: 2000.0000 mg | ORAL_TABLET | Freq: Once | ORAL | 0 refills | Status: AC

## 2018-08-27 MED FILL — metroNIDAZOLE 500 MG TABS: 500 | 1 days supply | Qty: 4 | Fill #0

## 2018-10-09 ENCOUNTER — Ambulatory Visit (HOSPITAL_COMMUNITY)
Admission: EM | Admit: 2018-10-09 | Discharge: 2018-10-09 | Disposition: A | Discharge status: Home / Self Care | Payer: Self-pay | Attending: Family Medicine | Admitting: Family Medicine

## 2018-10-09 ENCOUNTER — Other Ambulatory Visit: Payer: Self-pay

## 2018-10-09 ENCOUNTER — Encounter (HOSPITAL_COMMUNITY): Payer: Self-pay | Admitting: Family Medicine

## 2018-10-09 DIAGNOSIS — N3090 Cystitis, unspecified without hematuria: Secondary | ICD-10-CM

## 2018-10-09 LAB — POCT URINALYSIS DIP (DEVICE)
Glucose, UA: 250 mg/dL — AB
Nitrite: POSITIVE — AB
Protein, ur: 30 mg/dL — AB
Specific Gravity, Urine: >=1.03 (ref 1.005–1.030)
Urobilinogen, UA: 2 mg/dL — ABNORMAL HIGH (ref 0.0–1.0)
pH: 5.5 (ref 5.0–8.0)

## 2018-10-09 MED ORDER — CEPHALEXIN 500 MG PO CAPS: 500.0000 mg | ORAL_CAPSULE | Freq: Two times a day (BID) | ORAL | 0 refills | Status: DC

## 2018-10-09 NOTE — ED Triage Notes (Signed)
Patient concerned for uti.  Complains of itching, burning with a bad odor.  Reports painful urination.  Symptoms started one week ago.  Left back pain, intermittently

## 2018-10-10 MED FILL — glipiZIDE 10 MG TABS: 10 | 30 days supply | Qty: 60 | Fill #1

## 2018-10-10 MED FILL — !BRILINTA 90 MG TABLET: 30 days supply | Qty: 60 | Fill #1

## 2018-10-10 MED FILL — LISINOPRIL 10 MG TABS: 10 | 30 days supply | Qty: 30 | Fill #1

## 2018-10-10 MED FILL — CEPHALEXIN 500 MG CAPSULE: 500 | 5 days supply | Qty: 10 | Fill #0

## 2018-10-10 MED FILL — CARVEDILOL 3.125 MG TABLET: 3.125 | 30 days supply | Qty: 60 | Fill #1

## 2018-10-10 MED FILL — TRUE METRIX TEST STRIP: 25 days supply | Qty: 100 | Fill #3

## 2018-10-10 MED FILL — ATORVASTATIN 80 MG TABLET: 80 | 30 days supply | Qty: 30 | Fill #1

## 2018-10-10 MED FILL — !LANTUS SOLOSTAR 100UNITS/M: 100 | 24 days supply | Qty: 6 | Fill #0

## 2018-10-14 NOTE — ED Provider Notes (Signed)
[place]    ASSESSMENT & PLAN:  1. Cystitis    Meds ordered this encounter  Medications  . cephALEXin (KEFLEX) 500 MG capsule    Sig: Take 1 capsule (500 mg total) by mouth 2 (two) times daily.    Dispense:  10 capsule    Refill:  0   Empiric tx. Elects to not send urine culture secondary to cost; self-pay patient. Will follow up with her PCP or here if not showing improvement over the next 48 hours, sooner if needed.  Outlined signs and symptoms indicating need for more acute intervention. Patient verbalized understanding. After Visit Summary given.  SUBJECTIVE:  Amy Chaney is a 48 y.o. female who complains of urinary frequency, urgency and dysuria for the past several days. No flank pain, fever, chills, abnormal vaginal discharge or bleeding. Hematuria: not present. Normal PO intake. No abdominal pain. No self treatment. Ambulatory without difficulty.  LMP: Patient's last menstrual period was 10/20/2015.  ROS: As in HPI.  OBJECTIVE:  Vitals:   10/09/18 1641  BP: (!) 188/91  Pulse: 88  Resp: 18  Temp: (!) 97.1 F (36.2 C)  TempSrc: Temporal  SpO2: 100%   Appears well, in no apparent distress. Abdomen is soft without tenderness, guarding, mass, rebound or organomegaly. No CVA tenderness or inguinal adenopathy noted.  Labs Reviewed  POCT URINALYSIS DIP (DEVICE) - Abnormal; Notable for the following components:      Result Value   Glucose, UA 250 (*)    Bilirubin Urine SMALL (*)    Ketones, ur TRACE (*)    Hgb urine dipstick TRACE (*)    Protein, ur 30 (*)    Urobilinogen, UA 2.0 (*)    Nitrite POSITIVE (*)    Leukocytes,Ua TRACE (*)    All other components within normal limits    No Known Allergies  Past Medical History:  Diagnosis Date  . Coronary artery disease involving native coronary artery of native heart with angina pectoris ([place]) 12/09/2014   a. s/p PCI of the mid RCA 01/2013 //  b. LHC 9/14: EF 55%, RCA stent 100% occluded,  LAD irregularities >>  subacute stent thrombosis, Promus DES PCI distal overlap // c) 09/2017 - Ant STEMI - LAD-D2 PCI - Successful PCI of LAD (3 overlapping DES), unable to restore flow down D2l that was stented as well.  Likely related to downstream dissection, but unable to rewire.  . Daily headache   . Depression   . Dyslipidemia, goal LDL below 70   . History of Doppler ultrasound    carotid bruit >> a. Carotid US 6/17: bilat ICA 1-39%  . Hypertension   . Prolonged Q-T interval on ECG 01/11/2016  . STEMI involving left anterior descending coronary artery ([place]) 09/2017   Severe [place] 1, 1, 1 LAD-D2 lesion (complicated by post PTCA dissection/intramural hematoma) -successful extensive PCI of the LAD but unable to maintain patency of the stented D2.   Marland Kitchen STEMI involving oth coronary artery of inferior wall ([place]) 2014   Secondary to subacute stent thrombosis of RCA stent segment having missed 4 doses of Effient.  . Tobacco abuse 01/11/2016  . Type II diabetes mellitus ([place]) 12/2010   sister and son also diabetic    Social History   Socioeconomic History  . Marital status: Single    Spouse name: Not on file  . Number of children: Not on file  . Years of education: Not on file  . Highest education level: Not on file  Occupational History  . Not on file  Social Needs  . Financial resource strain: Not on file  . Food insecurity:    Worry: Not on file    Inability: Not on file  . Transportation needs:    Medical: Not on file    Non-medical: Not on file  Tobacco Use  . Smoking status: Current Every Day Smoker    Packs/day: 0.00    Years: 22.00    Pack years: 0.00    Types: Cigarettes  . Smokeless tobacco: Never Used  . Tobacco comment: pt states she smokes about 1-2 cigarettes per day--08/01/18  Substance and Sexual Activity  . Alcohol use: Yes    Alcohol/week: 0.0 standard drinks    Comment: 07/04/2018 "only on special occasions; maybe once/yr"  . Drug use: Yes    Types: Marijuana     Comment: 02/25/2013 "quit marijuana ~ 2001"  . Sexual activity: Not Currently    Birth control/protection: Surgical  Lifestyle  . Physical activity:    Days per week: Not on file    Minutes per session: Not on file  . Stress: Not on file  Relationships  . Social connections:    Talks on phone: Not on file    Gets together: Not on file    Attends religious service: Not on file    Active member of club or organization: Not on file    Attends meetings of clubs or organizations: Not on file    Relationship status: Not on file  . Intimate partner violence:    Fear of current or ex partner: Not on file    Emotionally abused: Not on file    Physically abused: Not on file    Forced sexual activity: Not on file  Other Topics Concern  . Not on file  Social History Narrative  . Not on file   Family History  Problem Relation Age of Onset  . Diabetes Mother   . Diabetes Sister   . Diabetes Sister   . Diabetes Son        type 1  . Diabetes Son        type 2       Vanessa Kick, MD 10/16/18 [phone]

## 2018-11-04 ENCOUNTER — Telehealth: Payer: Self-pay

## 2018-11-04 ENCOUNTER — Telehealth: Payer: Self-pay | Admitting: *Deleted

## 2018-11-04 NOTE — Telephone Encounter (Signed)
Virtual Visit Pre-Appointment Phone Call  Steps For Call:  1. Confirm consent - "In the setting of the current Covid19 crisis, you are scheduled for a (phone or video) visit with your provider on (date) at (time).  Just as we do with many in-office visits, in order for you to participate in this visit, we must obtain consent.  If you'd like, I can send this to your mychart (if signed up) or email for you to review.  Otherwise, I can obtain your verbal consent now.  All virtual visits are billed to your insurance company just like a normal visit would be.  By agreeing to a virtual visit, we'd like you to understand that the technology does not allow for your provider to perform an examination, and thus may limit your provider's ability to fully assess your condition.  Finally, though the technology is pretty good, we cannot assure that it will always work on either your or our end, and in the setting of a video visit, we may have to convert it to a phone-only visit.  In either situation, we cannot ensure that we have a secure connection.  Are you willing to proceed?"  2. Give patient instructions for WebEx download to smartphone as below if video visit  3. Advise patient to be prepared with any vital sign or heart rhythm information, their current medicines, and a piece of paper and pen handy for any instructions they may receive the day of their visit  4. Inform patient they will receive a phone call 15 minutes prior to their appointment time (may be from unknown caller ID) so they should be prepared to answer  5. Confirm that appointment type is correct in Epic appointment notes (video vs telephone)    TELEPHONE CALL NOTE  Amy Chaney has been deemed a candidate for a follow-up tele-health visit to limit community exposure during the Covid-19 pandemic. I spoke with the patient via phone to ensure availability of phone/video source, confirm preferred email & phone number, and discuss  instructions and expectations.  I reminded Amy Chaney to be prepared with any vital sign and/or heart rhythm information that could potentially be obtained via home monitoring, at the time of her visit. I reminded Amy Chaney to expect a phone call at the time of her visit if her visit.  Did the patient verbally acknowledge consent to treatment? YES  Amy Chaney, [place] 11/04/2018 11:12 AM   DOWNLOADING THE [place]  - If Apple, go to [employer] and type in WebEx in the search bar. [place] [employer], the blue/green circle. The app is free but as with any other app downloads, their phone may require them to verify saved payment information or Apple password. The patient does NOT have to create an account.  - If Android, ask patient to go to [employer] and type in WebEx in the search bar. [place] [employer], the blue/green circle. The app is free but as with any other app downloads, their phone may require them to verify saved payment information or Android password. The patient does NOT have to create an account.   CONSENT FOR TELE-HEALTH VISIT - PLEASE REVIEW  I hereby voluntarily request, consent and authorize [facility] and its employed or contracted physicians, physician assistants, nurse practitioners or other licensed health care professionals (the Practitioner), to provide me with telemedicine health care services (the "Services") as deemed necessary by the treating Practitioner. I  acknowledge and consent to receive the Services by the Practitioner via telemedicine. I understand that the telemedicine visit will involve communicating with the Practitioner through live audiovisual communication technology and the disclosure of certain medical information by electronic transmission. I acknowledge that I have been given the opportunity to request an in-person assessment or other available alternative prior to the telemedicine visit  and am voluntarily participating in the telemedicine visit.  I understand that I have the right to withhold or withdraw my consent to the use of telemedicine in the course of my care at any time, without affecting my right to future care or treatment, and that the Practitioner or I may terminate the telemedicine visit at any time. I understand that I have the right to inspect all information obtained and/or recorded in the course of the telemedicine visit and may receive copies of available information for a reasonable fee.  I understand that some of the potential risks of receiving the Services via telemedicine include:  Marland Kitchen Delay or interruption in medical evaluation due to technological equipment failure or disruption; . Information transmitted may not be sufficient (e.g. poor resolution of images) to allow for appropriate medical decision making by the Practitioner; and/or  . In rare instances, security protocols could fail, causing a breach of personal health information.  Furthermore, I acknowledge that it is my responsibility to provide information about my medical history, conditions and care that is complete and accurate to the best of my ability. I acknowledge that Practitioner's advice, recommendations, and/or decision may be based on factors not within their control, such as incomplete or inaccurate data provided by me or distortions of diagnostic images or specimens that may result from electronic transmissions. I understand that the practice of medicine is not an exact science and that Practitioner makes no warranties or guarantees regarding treatment outcomes. I acknowledge that I will receive a copy of this consent concurrently upon execution via email to the email address I last provided but may also request a printed copy by calling the office of [place] [place].    I understand that my insurance will be billed for this visit.   I have read or had this consent read to me. . I understand  the contents of this consent, which adequately explains the benefits and risks of the Services being provided via telemedicine.  . I have been provided ample opportunity to ask questions regarding this consent and the Services and have had my questions answered to my satisfaction. . I give my informed consent for the services to be provided through the use of telemedicine in my medical care  By participating in this telemedicine visit I agree to the above.

## 2018-11-04 NOTE — Telephone Encounter (Signed)
Left message to call back - need schedule e visit 4/9 with dr Ellyn Hack.

## 2018-11-06 ENCOUNTER — Other Ambulatory Visit: Payer: Self-pay

## 2018-11-07 ENCOUNTER — Encounter: Payer: Self-pay | Admitting: Cardiology

## 2018-11-07 ENCOUNTER — Telehealth (INDEPENDENT_AMBULATORY_CARE_PROVIDER_SITE_OTHER): Payer: Self-pay | Admitting: Cardiology

## 2018-11-07 DIAGNOSIS — E785 Hyperlipidemia, unspecified: Secondary | ICD-10-CM

## 2018-11-07 DIAGNOSIS — I10 Essential (primary) hypertension: Secondary | ICD-10-CM

## 2018-11-07 DIAGNOSIS — Z720 Tobacco use: Secondary | ICD-10-CM

## 2018-11-07 DIAGNOSIS — Z91199 Patient's noncompliance with other medical treatment and regimen due to unspecified reason: Secondary | ICD-10-CM

## 2018-11-07 DIAGNOSIS — I110 Hypertensive heart disease with heart failure: Secondary | ICD-10-CM

## 2018-11-07 DIAGNOSIS — Z716 Tobacco abuse counseling: Secondary | ICD-10-CM

## 2018-11-07 DIAGNOSIS — E1169 Type 2 diabetes mellitus with other specified complication: Secondary | ICD-10-CM

## 2018-11-07 DIAGNOSIS — I252 Old myocardial infarction: Secondary | ICD-10-CM

## 2018-11-07 DIAGNOSIS — F17200 Nicotine dependence, unspecified, uncomplicated: Secondary | ICD-10-CM

## 2018-11-07 DIAGNOSIS — I25119 Atherosclerotic heart disease of native coronary artery with unspecified angina pectoris: Secondary | ICD-10-CM

## 2018-11-07 DIAGNOSIS — I2102 ST elevation (STEMI) myocardial infarction involving left anterior descending coronary artery: Secondary | ICD-10-CM

## 2018-11-07 DIAGNOSIS — Z9119 Patient's noncompliance with other medical treatment and regimen: Secondary | ICD-10-CM

## 2018-11-07 DIAGNOSIS — R195 Other fecal abnormalities: Secondary | ICD-10-CM

## 2018-11-07 DIAGNOSIS — I5042 Chronic combined systolic (congestive) and diastolic (congestive) heart failure: Secondary | ICD-10-CM

## 2018-11-07 NOTE — Assessment & Plan Note (Signed)
Doing well with weight loss.  Is down to 207 pounds from 234 last visit.  She is very happy with this having adjusted her diet.  She is trying to get more walking in.  I congratulated her on her efforts.

## 2018-11-07 NOTE — Assessment & Plan Note (Signed)
Pretty much ischemic cardiomyopathy with no active heart failure symptoms.  EF somewhat improved after second MI up to 35-40%.  She is on lisinopril (would like to switch to [place], but she would not build afford it). On stable dose carvedilol.  Not requiring any diuretic.  Is euvolemic per her report. Could consider adding spironolactone if there was a sense of any edema.

## 2018-11-07 NOTE — Progress Notes (Signed)
Virtual Visit via Telephone Note   This visit type was conducted due to national recommendations for restrictions regarding the COVID-19 Pandemic (e.g. social distancing) in an effort to limit this patient's exposure and mitigate transmission in our community.  Due to her co-morbid illnesses, this patient is at least at moderate risk for complications without adequate follow up.  This format is felt to be most appropriate for this patient at this time.  The patient did not have access to video technology/had technical difficulties with video requiring transitioning to audio format only (telephone).  All issues noted in this document were discussed and addressed.  No physical exam could be performed with this format.  Please refer to the patient's chart for her  consent to telehealth for [facility].   She apparently does not have been with to be able to receive video calls.  Evaluation Performed:  Follow-up visit  Date:  11/07/2018   ID:  Amy Chaney, DOB Jun 14, 1971, MRN [id number]  Patient Location: Home  Provider Location: Home  PCP:  [name], MD  Cardiologist:  Glenetta Hew, MD  Electrophysiologist:  None   Chief Complaint: 66-monthfollow-up for CAD and PCI  History of Present Illness:    Amy YOSHINOis a 48y.o. female who presents via audio/video conferencing for a telehealth visit today.    She has history of 3 separate ST elevation MIs:   July 2014 -> RCA PCI followed by in-stent thrombosis in September 2014 (overlapping Promus stents)  March 2019 bifurcation LAD-D2 lesion -> complicated DES PCI with ultimate loss of D2 despite attempted stent secondary to dissection and inability to rewire  December 2019 distal stent thrombosis and LAD: DES PCI of the LAD overlapping previous stents distally.  Ischemic cardiomyopathy with EF of 35 to 40% by echo  She also has diabetes with most recent A1c of 9.9.  She was last seen on August 01, 2018 by RRosaria Ferries PA for hospital follow-up --> she noted exertional dyspnea but not at rest.  No chest pain or pressure.  No PND, orthopnea or edema.  Down to 1-2 cigarettes a day.  EKG showed evolving changes MI. 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  Did not increase beta-blocker because of exertional dyspnea and fatigue.  Not placed on diuretic --> Plan recheck labs per IM prior to this visit. --> Smoking cessation counseling  [name] being seen today for follow-up: Still notes DOE & legs hurt with exertion.  Has to stop & catch her breath.  Once she catches her breath - able to go back to walking. Legs bother her more than DOE.  L ankle a little swollen. 1 x per week notes that she wakes up SOB -- sits up -- PND. Sleeps on 1 pillow.  Occasionally notes increased HR when laying down.   NO resting or exertional dyspnea.   Cardiovascular ROS: positive for - dyspnea on exertion, paroxysmal nocturnal dyspnea and rapid heart rate negative for - chest pain, edema, loss of consciousness, orthopnea, palpitations, shortness of breath or no syncope / near syncope; no TIA/Amaurosis [place]; no melena, hematochezia, hematuria, epistaxis.  Did not qualify for Disability. Getting Brilinta through [facility] for free, but may have missed a month (unable to get to the office to get the med - March).  Was taking 1/day. Now back to BID.    Down to 1-2 cigarettes/day - but may miss a day here &  there.  Still has smoker's cough.  The patient does not have symptoms concerning for COVID-19 infection (fever, chills, cough, or new shortness of breath).   ROS:  Please see the history of present illness.    Review of Systems  Constitutional: Positive for weight loss (intentional - see below). Negative for chills, fever and malaise/fatigue (just gets tired doing lots of housework).  HENT: Negative for nosebleeds.   Respiratory: Positive for cough  (chronic smoker's cough - not new). Negative for sputum production (just mucous) and shortness of breath.   Gastrointestinal: Negative for abdominal pain, blood in stool, constipation, diarrhea and melena.  Genitourinary: Negative for hematuria.  Musculoskeletal: Negative for falls, joint pain and myalgias.  Neurological: Negative for dizziness (every now & then - standing up quickly), focal weakness and weakness.  Psychiatric/Behavioral: Negative for depression and memory loss. The patient is not nervous/anxious and does not have insomnia.   All other systems reviewed and are negative.   Past Medical History:  Diagnosis Date  . Coronary artery disease involving native coronary artery of native heart with angina pectoris ([place]) 01/2013   a. s/p PCI of the mid RCA 01/2013 //  b. LHC 9/14: EF 55%, RCA stent 100% occluded, LAD irregularities >>  subacute stent thrombosis, Promus DES PCI distal overlap // c) 09/2017 - Ant STEMI - LAD-D2 PCI - Successful PCI of LAD (3 overlapping DES), unable to restore flow down D2l that was stented as well.  Likely related to downstream dissection, but unable to rewire.  . Daily headache   . Depression   . Dyslipidemia, goal LDL below 70   . History of Doppler ultrasound    carotid bruit >> a. Carotid US 6/17: bilat ICA 1-39%  . Hypertension   . STEMI involving left anterior descending coronary artery ([place]) 09/2017; 06/2018   a) Severe [place] 1, 1, 1 LAD-D2 lesion (complicated by post PTCA dissection/intramural hematoma) -successful extensive PCI of the LAD but unable to maintain patency of the stented D2. b) distal mLAD stent Edge 100% thrombosis --> PTCA & Overlapping DES PCI (Synergy 3 x 12 --> 3.6-3.3 mm). D2 occluded. RCA stents patent, 65 % OM2. EF 30-35%.  Marland Kitchen STEMI involving oth coronary artery of inferior wall ([place]) 03/2013   Secondary to subacute stent thrombosis of RCA stent segment having missed 4 doses of Effient.  . Tobacco abuse 01/11/2016  . Type II  diabetes mellitus ([place]) 12/2010   sister and son also diabetic    Past Surgical History:  Procedure Laterality Date  . CHOLECYSTECTOMY  ~ 2000  . CORONARY STENT INTERVENTION N/A 10/10/2017   Procedure: CORONARY STENT INTERVENTION;  Surgeon: Leonie Man, MD;  Location: [place] [facility];  Service: Cardiovascular; LAD PCI: STENT SYNERGY DES 3.5X32 (p),STENT SYNERGY DES 3.5X 8 (m), STENT SYNERGY DES 3.5X28 (d).  D2 PCI: STENT SYNERGY DES 2.5X24.  (UNABL TO REWIRE & EXPAND - TO OF DIAG AT END OF CASE)  . CORONARY STENT INTERVENTION N/A 07/03/2018   Procedure: CORONARY STENT INTERVENTION;  Surgeon: Leonie Man, MD;  Location: [facility];; PTCA & overlapping DES PCI (overlaps prior stents distally) - Synergy DES 3 x 12 (3.6 mm @ overlap -- 3.3 mm distally)>  . LEFT HEART CATH AND CORONARY ANGIOGRAPHY N/A 10/10/2017   Procedure: LEFT HEART CATH AND CORONARY ANGIOGRAPHY;  Surgeon: Leonie Man, MD;  Location: [place] [facility];  Service: Cardiovascular;  Laterality: N/A; -patent RCA stents with mild in-stent restenosis. [place] Mow 1,1,1  mLD-D2 24% (complicated by dissection/intramural thrombus)  . LEFT HEART CATH AND CORONARY ANGIOGRAPHY N/A 07/03/2018   Procedure: LEFT HEART CATH AND CORONARY ANGIOGRAPHY;  Surgeon: Leonie Man, MD;  Location: [place] [facility];; 100% thrombotic occlusion distal LAD stent -- DES PCI. continued 100% D2 (previously lost). patent RCA stents, 65% OM2.  EF 30-35%.  Marland Kitchen LEFT HEART CATHETERIZATION WITH CORONARY ANGIOGRAM N/A 02/25/2013   Procedure: LEFT HEART CATHETERIZATION WITH CORONARY ANGIOGRAM;  Surgeon: Laverda Page, MD;  Location: [facility] CATH LAB;  Service: Cardiovascular;; CTO m RCA; otherwise normal coronaries  . LEFT HEART CATHETERIZATION WITH CORONARY ANGIOGRAM N/A 04/01/2013   Procedure: LEFT HEART CATHETERIZATION WITH CORONARY ANGIOGRAM;  Surgeon: Laverda Page, MD;  Location: [facility] CATH LAB;  Service: Cardiovascular: 100% occlusion of distal RCA  stent (subacute stent thrombosis -  secondary to stopping Effient).  Overlapping DES PCI  . NM MYOVIEW LTD  12/2015    EF 38%.  Hypertensive response to exercise (231/132 mmHg).  INTERMEDIATE RISK due to -diffuse hypokinesis and reduced EF.  No ischemia or infarction.  Marland Kitchen PERCUTANEOUS CORONARY STENT INTERVENTION (PCI-S)  04/01/2013   Procedure: PERCUTANEOUS CORONARY STENT INTERVENTION (PCI-S);  Surgeon: Laverda Page, MD;  Location: [facility] CATH LAB;  Service: Cardiovascular;; PCI of distal RCA stent subacute thrombosis: Promus Premier DES 3.0 mm x 20 mm.  Marland Kitchen PERCUTANEOUS CORONARY STENT INTERVENTION (PCI-S)  02/25/2013   Procedure: PERCUTANEOUS CORONARY STENT INTERVENTION (PCI-S);  Surgeon: Laverda Page, MD;  Location: [facility] CATH LAB;  RCA CTO PCI with overlapping Promus DES: 3.0 mm x 38 mm, 3.5 mm x 48m  . TRANSTHORACIC ECHOCARDIOGRAM  12/2015   mild LVH, EF 55-60%, no RWMA, Gr 1 DD, mild MR  . TRANSTHORACIC ECHOCARDIOGRAM  09/2017   In setting of anterior STEMI:  EF 35% with mild concentric hypertrophy.  GRII DD.  Anteroseptal, anterior and anterolateral walls hypokinetic.  Moderate MR.  . TRANSTHORACIC ECHOCARDIOGRAM  07/04/2018   recurrent Anterior STEMI: EF 35-40%.  Apical hypokinesis.  GRII DD.  No thrombus noted.  (Improved EF from 30% up to 35-40% from previous echo)  . TUBAL LIGATION  1994     Current Meds  Medication Sig  . aspirin EC 81 MG tablet Take 1 tablet (81 mg total) by mouth daily.  .Marland Kitchenatorvastatin (LIPITOR) 80 MG tablet Take 1 tablet (80 mg total) by mouth daily.  . blood glucose meter kit and supplies Dispense based on patient and insurance preference. Use up to four times daily as directed. (FOR ICD-10 E10.9, E11.9).  .Marland KitchenBlood Glucose Monitoring Suppl (TRUE METRIX METER) DEVI 1 kit by Does not apply route 4 (four) times daily.  . carvedilol (COREG) 3.125 MG tablet Take 1 tablet (3.125 mg total) by mouth 2 (two) times daily.  .Marland KitchenglipiZIDE (GLUCOTROL) 10 MG tablet Take 1 tablet  (10 mg total) by mouth 2 (two) times daily before a meal.  . glucose blood (TRUE METRIX BLOOD GLUCOSE TEST) test strip Use as instructed  . Insulin Glargine (LANTUS SOLOSTAR) 100 UNIT/ML Solostar Pen Inject 25 Units into the skin daily.  .Marland Kitchenlisinopril (PRINIVIL,ZESTRIL) 10 MG tablet Take 1 tablet (10 mg total) by mouth daily.  . nitroGLYCERIN (NITROSTAT) 0.4 MG SL tablet Place 1 tablet (0.4 mg total) under the tongue every 5 (five) minutes as needed for chest pain. Reported on 11/25/2015  . Tdap (BOOSTRIX) 5-2.5-18.5 LF-MCG/0.5 injection Inject 0.5 mLs into the muscle as directed.  . ticagrelor (BRILINTA) 90 MG TABS tablet Take 1  tablet (90 mg total) by mouth 2 (two) times daily.  . TRUEPLUS LANCETS 28G MISC 28 g by Does not apply route 4 (four) times daily.     Allergies:   Patient has no known allergies.   Social History   Tobacco Use  . Smoking status: Current Every Day Smoker    Packs/day: 0.00    Years: 22.00    Pack years: 0.00    Types: Cigarettes  . Smokeless tobacco: Never Used  . Tobacco comment: pt states she smokes about 1-2 cigarettes per day--08/01/18  Substance Use Topics  . Alcohol use: Yes    Alcohol/week: 0.0 standard drinks    Comment: 07/04/2018 "only on special occasions; maybe once/yr"  . Drug use: Yes    Types: Marijuana    Comment: 02/25/2013 "quit marijuana ~ 2001"     Family Hx: The patient's family history includes Diabetes in her mother, sister, sister, son, and son.   Prior CV studies:   The following studies were reviewed today:  Cath-PCI 07/03/2018: Distal stent edge 100% thrombosis (in the mid LAD) as culprit for anterior STEMI: Treated with PTCA and overlapping DES stent (synergy 3.0 mm x 12 mm postdilated to 3.6 mm in the overlap and 3.3 mm distally./ Continued occlusion of the previously jailed 2nd Diag branch./Patent RCA stent (proximal to mid)./Small OM 2 65%..  Moderate to severe ischemic cardiomyopathy with reduced EF in the anterior-apical and  inferoapical hypokinesis.  (EF roughly 30 to 35%) Diagnostic       Intervention      Wall Motion - 2 = hypokinesis     2D Echo July 04, 2018: EF 35-40%.  Apical hypokinesis.  GRII DD.  No thrombus noted.  (Improved EF from 30% up to 35-40% from previous echo)   Labs/Other Testsand Data Reviewed:    EKG:  No ECG reviewed.  Recent Labs: 07/05/2018: BUN 12; Creatinine, Ser 0.74; Hemoglobin 11.8; Platelets 303; Potassium 3.5; Sodium 137   Recent Lipid Panel Lab Results  Component Value Date/Time   CHOL 164 07/05/2018 03:30 AM   TRIG 73 07/05/2018 03:30 AM   HDL 39 (L) 07/05/2018 03:30 AM   CHOLHDL 4.2 07/05/2018 03:30 AM   LDLCALC 110 (H) 07/05/2018 03:30 AM   -- due for labs soon.   Wt Readings from Last 3 Encounters:  11/07/18 207 lb (93.9 kg)  08/21/18 234 lb (106.1 kg)  08/01/18 234 lb 12.8 oz (106.5 kg)   -- trying to loose weight.  Walks when she can.    Objective:    Vital Signs:  Wt 207 lb (93.9 kg)   LMP 10/20/2015   BMI 30.57 kg/m  - BP machine battery is dead. Seems healthy in no acute distress on the telephone.  No increased work of breathing.  Notes mild left ankle swelling but no edema.  ASSESSMENT & PLAN:    Problem List Items Addressed This Visit    Acute ST elevation myocardial infarction (STEMI) involving left anterior descending (LAD) coronary artery (HCC)    Has had 1 inferior and 2 anterior MIs.  Now has reduced EF. As a result of the first anterior MI, dissection into the second diagonal branch led to occlusion of that vessel which was not able to be reopened.  The LAD itself now has 3 overlapping stents.  Needs to stay on Brilinta essentially lifelong.  We can switch to 60 mg twice daily dosing after December 2020.      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  EF somewhat improved after second MI up to 35-40%.   She is on lisinopril (would like to switch to [place], but she would not build afford it). On stable dose carvedilol.  Not requiring any diuretic.  Is euvolemic per her report. Could consider adding spironolactone if there was a sense of any edema.      Coronary artery disease involving native coronary artery of native heart with angina pectoris (HCC) (Chronic)    Now with extensive PCI to the LAD and RCA with 3 separate ST elevation MIs.  Thankfully, no longer having any angina but is still having some exertional dyspnea which is probably related to deconditioning and her cardiomyopathy.  I counseled her on the importance of making sure that she is able to maintain her Brilinta twice daily.  She is getting this free of charge and therefore financially is not an issue is just a matter of getting to the office to pick up the [place].  For 1 month she was taking it once a day as opposed to twice a day.  I counseled her on the very much importance of taking this twice daily. She is on high-dose atorvastatin, low-dose carvedilol and lisinopril -> no change for now since I do not have a blood pressure to evaluate by.  We will need to continue on aspirin plus Brilinta through December.  At which time I would probably switch her to maintenance dose Brilinta (60 mg twice daily) and stop aspirin.  --As of December would be okay to hold Brilinta for procedures.      Relevant Orders   Comprehensive metabolic panel   CBC   Dark stools    Has had a history of dark stools, not currently.  However with her being on aspirin and Brilinta I would like to make sure that her CBC is stable.  Would recommend CBC added to her routine labs.      Relevant Orders   CBC   Dyslipidemia, goal LDL below 70   Relevant Orders   Lipid panel   Comprehensive metabolic panel   Essential hypertension (Chronic)    Her blood pressure has been relatively well controlled during recent visits.  Unfortunately her machine is  not working so we could not get one done today.  She does have a little bit of orthostatic dizziness on occasion, therefore I would not increase any dosing.  No longer on HCTZ or chlorthalidone.  Would preferably use spironolactone if we needed any diuretic.      H/O noncompliance with medical treatment, presenting hazards to health (Chronic)    Counseled on the extreme importance of taking her Brilinta as written.  If she has trouble getting medications, she needs to contact our office so that we can help      Relevant Orders   Lipid panel   Comprehensive metabolic panel   CBC   Hyperlipidemia associated with type 2 diabetes mellitus ([place]) (Chronic)    Was scheduled to have her cholesterol levels checked, but secondary to COVID-19 restrictions this is been delayed.  I want her to try to contact her PCPs office to see if they are having her labs done soon, if not we will order FLP,CMP and CBC along with A1c.      Relevant Orders   Lipid panel   Comprehensive metabolic panel   Morbid obesity ([place]): BMI ~36 with HTN, HLD,  CAD (Chronic)    Doing well with weight loss.  Is down to 207 pounds from 234 last visit.  She is very happy with this having adjusted her diet.  She is trying to get more walking in.  I congratulated her on her efforts.      Smoker (Chronic)    I did spend some time counseling her on the importance of trying to fully quit.  Not yet ready to do so.  Down to 1 or 2 a day and missing some days.      STEMI involving left anterior descending coronary artery (HCC) (Chronic)    Status post sequential anterior ST elevation MI in March then December 2019.  Subsequently has reduced EF with extensive PCI to the LAD and loss of D2.  Thankfully, not having any more angina.  Doing well on carvedilol, lisinopril and statin along with aspirin and Brilinta.      Relevant Orders   Comprehensive metabolic panel   CBC   Tobacco abuse   Tobacco abuse counseling    Smoking  cessation instruction/counseling given:  counseled patient on the dangers of tobacco use, advised patient to stop smoking, and reviewed strategies to maximize success. < 3 min.         COVID-19 Education: The signs and symptoms of COVID-19 were discussed with the patient and how to seek care for testing (follow up with PCP or arrange E-visit).  The importance of social distancing was discussed today.  Time:   Today, I have spent 20-25 minutes with the patient with telehealth technology discussing the above problems along with COVID-19 related social distancing and her increased risk.  Also discussed importance of taking her medications correctly and upcoming labs..     Medication Adjustments/Labs and Tests Ordered: Current medicines are reviewed at length with the patient today.  Concerns regarding medicines are outlined above.  Tests Ordered: Orders Placed This Encounter  Procedures  . Lipid panel  . Comprehensive metabolic panel  . CBC   Medication Changes: No orders of the defined types were placed in this encounter.   Disposition:  Follow up in 3 month(s)  Signed, Glenetta Hew, MD  11/07/2018 6:17 PM    [place]

## 2018-11-07 NOTE — Assessment & Plan Note (Signed)
Has had 1 inferior and 2 anterior MIs.  Now has reduced EF. As a result of the first anterior MI, dissection into the second diagonal branch led to occlusion of that vessel which was not able to be reopened.  The LAD itself now has 3 overlapping stents.  Needs to stay on Brilinta essentially lifelong.  We can switch to 60 mg twice daily dosing after December 2020.

## 2018-11-07 NOTE — Assessment & Plan Note (Signed)
I did spend some time counseling her on the importance of trying to fully quit.  Not yet ready to do so.  Down to 1 or 2 a day and missing some days.

## 2018-11-07 NOTE — Assessment & Plan Note (Signed)
Smoking cessation instruction/counseling given:  counseled patient on the dangers of tobacco use, advised patient to stop smoking, and reviewed strategies to maximize success. < 3 min.

## 2018-11-07 NOTE — Assessment & Plan Note (Signed)
Now with extensive PCI to the LAD and RCA with 3 separate ST elevation MIs.  Thankfully, no longer having any angina but is still having some exertional dyspnea which is probably related to deconditioning and her cardiomyopathy.  I counseled her on the importance of making sure that she is able to maintain her Brilinta twice daily.  She is getting this free of charge and therefore financially is not an issue is just a matter of getting to the office to pick up the [place].  For 1 month she was taking it once a day as opposed to twice a day.  I counseled her on the very much importance of taking this twice daily. She is on high-dose atorvastatin, low-dose carvedilol and lisinopril -> no change for now since I do not have a blood pressure to evaluate by.  We will need to continue on aspirin plus Brilinta through December.  At which time I would probably switch her to maintenance dose Brilinta (60 mg twice daily) and stop aspirin.  --As of December would be okay to hold Brilinta for procedures.

## 2018-11-07 NOTE — Assessment & Plan Note (Signed)
Counseled on the extreme importance of taking her Brilinta as written.  If she has trouble getting medications, she needs to contact our office so that we can help

## 2018-11-07 NOTE — Assessment & Plan Note (Signed)
Has had a history of dark stools, not currently.  However with her being on aspirin and Brilinta I would like to make sure that her CBC is stable.  Would recommend CBC added to her routine labs.

## 2018-11-07 NOTE — Assessment & Plan Note (Signed)
Was scheduled to have her cholesterol levels checked, but secondary to COVID-19 restrictions this is been delayed.  I want her to try to contact her PCPs office to see if they are having her labs done soon, if not we will order FLP,CMP and CBC along with A1c.

## 2018-11-07 NOTE — Patient Instructions (Addendum)
   Medication Instructions:  NO CHANGES IN MEDICATIONS - DO NOT  MISS ANY DOSES OF BRILINTA If you need a refill on your cardiac medications before your next appointment, please call your pharmacy.   Lab work: Lipid , CMP CBC - HERE IS LABSLIP YOU CAN TAKE TO PRIMARY OFFICE OR COME OUR OFFICE TO HAVE LABS DONE Need to ensure that labs are being checked by PCP.  If they are not scheduled to be done this month, please contact our office so that we can arrange for fasting lipid panel, CMP and CBC. --This would be in addition to your PCP checking things like thyroid levels and hemoglobin A1c.  If you have labs (blood work) drawn today and your tests are completely normal, you will receive your results only by: Marland Kitchen MyChart Message (if you have MyChart) OR . A paper copy in the mail If you have any lab test that is abnormal or we need to change your treatment, we will call you to review the results.  Testing/Procedures: NOT NEEDED  Follow-Up: At [facility], you and your health needs are our priority.  As part of our continuing mission to provide you with exceptional heart care, we have created designated Provider Care Teams.  These Care Teams include your primary Cardiologist (physician) and Advanced Practice Providers (APPs -  Physician Assistants and Nurse Practitioners) who all work together to provide you with the care you need, when you need it. You will need a follow up appointment in 3 to 4  months- July or Aug 2020.  Please call our office 2 months in advance to schedule this appointment.  You may see Glenetta Hew, MD or one of the following Advanced Practice Providers on your designated Care Team:   Rosaria Ferries, PA-C . Jory Sims, DNP, ANP  Any Other Special Instructions Will Be Listed Below (If Applicable).

## 2018-11-07 NOTE — Assessment & Plan Note (Signed)
Status post sequential anterior ST elevation MI in March then December 2019.  Subsequently has reduced EF with extensive PCI to the LAD and loss of D2.  Thankfully, not having any more angina.  Doing well on carvedilol, lisinopril and statin along with aspirin and Brilinta.

## 2018-11-07 NOTE — Assessment & Plan Note (Signed)
Her blood pressure has been relatively well controlled during recent visits.  Unfortunately her machine is not working so we could not get one done today.  She does have a little bit of orthostatic dizziness on occasion, therefore I would not increase any dosing.  No longer on HCTZ or chlorthalidone.  Would preferably use spironolactone if we needed any diuretic.

## 2018-11-11 ENCOUNTER — Ambulatory Visit: Payer: Self-pay | Admitting: Family Medicine

## 2018-11-21 ENCOUNTER — Other Ambulatory Visit: Payer: Self-pay

## 2018-11-21 ENCOUNTER — Ambulatory Visit: Payer: Self-pay | Attending: Family Medicine | Admitting: Family Medicine

## 2018-11-21 ENCOUNTER — Encounter: Payer: Self-pay | Admitting: Family Medicine

## 2018-11-21 DIAGNOSIS — R87610 Atypical squamous cells of undetermined significance on cytologic smear of cervix (ASC-US): Secondary | ICD-10-CM | POA: Insufficient documentation

## 2018-11-21 DIAGNOSIS — R87810 Cervical high risk human papillomavirus (HPV) DNA test positive: Secondary | ICD-10-CM

## 2018-11-21 DIAGNOSIS — E1165 Type 2 diabetes mellitus with hyperglycemia: Secondary | ICD-10-CM

## 2018-11-21 DIAGNOSIS — I110 Hypertensive heart disease with heart failure: Secondary | ICD-10-CM

## 2018-11-21 DIAGNOSIS — B373 Candidiasis of vulva and vagina: Secondary | ICD-10-CM

## 2018-11-21 DIAGNOSIS — B3731 Acute candidiasis of vulva and vagina: Secondary | ICD-10-CM

## 2018-11-21 DIAGNOSIS — I5042 Chronic combined systolic (congestive) and diastolic (congestive) heart failure: Secondary | ICD-10-CM

## 2018-11-21 MED ORDER — INSULIN GLARGINE 100 UNIT/ML SOLOSTAR PEN: 30.0000 [IU] | PEN_INJECTOR | Freq: Every day | SUBCUTANEOUS | 3 refills | Status: DC

## 2018-11-21 MED ORDER — LISINOPRIL 10 MG PO TABS: 10.0000 mg | ORAL_TABLET | Freq: Every day | ORAL | 6 refills | Status: DC

## 2018-11-21 MED ORDER — ATORVASTATIN CALCIUM 80 MG PO TABS: 80.0000 mg | ORAL_TABLET | Freq: Every day | ORAL | 1 refills | Status: DC

## 2018-11-21 MED ORDER — FLUCONAZOLE 150 MG PO TABS: 150.0000 mg | ORAL_TABLET | Freq: Once | ORAL | 0 refills | Status: AC

## 2018-11-21 MED ORDER — GLIPIZIDE 10 MG PO TABS: 10.0000 mg | ORAL_TABLET | Freq: Two times a day (BID) | ORAL | 1 refills | Status: DC

## 2018-11-21 MED ORDER — CARVEDILOL 3.125 MG PO TABS: 3.1250 mg | ORAL_TABLET | Freq: Two times a day (BID) | ORAL | 1 refills | Status: DC

## 2018-11-21 MED FILL — LISINOPRIL 10 MG TABS: 10 | 30 days supply | Qty: 30 | Fill #2

## 2018-11-21 MED FILL — glipiZIDE 10 MG TABS: 10 | 30 days supply | Qty: 60 | Fill #2

## 2018-11-21 MED FILL — !LANTUS SOLOSTAR 100UNITS/M: 100 | 30 days supply | Qty: 9 | Fill #0

## 2018-11-21 MED FILL — FLUCONAZOLE 150 MG TABS: 150 | 1 days supply | Qty: 1 | Fill #0

## 2018-11-21 MED FILL — CARVEDILOL 3.125 MG TABLET: 3.125 | 30 days supply | Qty: 60 | Fill #2

## 2018-11-21 MED FILL — ATORVASTATIN 80 MG TABLET: 80 | 30 days supply | Qty: 30 | Fill #0

## 2018-11-21 NOTE — Progress Notes (Signed)
Patient has been called and DOB has been verified. Patient has been screened and transferred to PCP to start phone visit.  C/C: Diabetes  Refills: All medication.

## 2018-11-21 NOTE — Progress Notes (Signed)
Virtual Visit via Telephone Note  I connected with Amy Chaney, on 11/21/2018 at 9:36am by telephone and verified that I am speaking with the correct person using two identifiers.   Consent: I discussed the limitations, risks, security and privacy concerns of performing an evaluation and management service by telephone and the availability of in person appointments. I also discussed with the patient that there may be a patient responsible charge related to this service. The patient expressed understanding and agreed to proceed.   Location of Patient: Home  Location of Provider: Clinic   Persons participating in Telemedicine visit: Amy Chaney Dr. Felecia Shelling     History of Present Illness: Amy Chaney  is a 48 year old female with a history of type 2 diabetes mellitus (A1c 9.9), hypertension, CHF (EF 35% from 09/2017), CAD (status post PCI to LAD with 3 overlapping DES in 09/2017), last STEMI was in 06/2018 She has dyspnea on exertion but denies chest pain or pedal edema.  On 11/07/2018 she had a telemedicine visit with her cardiologist Dr. Ellyn Hack.  Her fasting sugars have been less than 150 she says and at other times she has had them in the 200s.  Parents with a diabetic diet cannot be ascertained. Complains of vaginal itching but no discharge and is wondering if she has a recurrent episode of trichomonas.  She was treated in 07/2018 for trichomoniasis and her last sexual activity was in June of last year.  She did have ASCUS, HPV positive on her last Pap smear from earlier this year and was referred to GYN however she never followed through with making an appointment as she was not certain she would be seen due to the COVID-19 pandemic.  Encouraged to make an appointment with GYN.  Past Medical History:  Diagnosis Date  . Coronary artery disease involving native coronary artery of native heart with angina pectoris ([place]) 01/2013   a. s/p PCI of the  mid RCA 01/2013 //  b. LHC 9/14: EF 55%, RCA stent 100% occluded, LAD irregularities >>  subacute stent thrombosis, Promus DES PCI distal overlap // c) 09/2017 - Ant STEMI - LAD-D2 PCI - Successful PCI of LAD (3 overlapping DES), unable to restore flow down D2l that was stented as well.  Likely related to downstream dissection, but unable to rewire.  . Daily headache   . Depression   . Dyslipidemia, goal LDL below 70   . History of Doppler ultrasound    carotid bruit >> a. Carotid US 6/17: bilat ICA 1-39%  . Hypertension   . STEMI involving left anterior descending coronary artery ([place]) 09/2017; 06/2018   a) Severe [place] 1, 1, 1 LAD-D2 lesion (complicated by post PTCA dissection/intramural hematoma) -successful extensive PCI of the LAD but unable to maintain patency of the stented D2. b) distal mLAD stent Edge 100% thrombosis --> PTCA & Overlapping DES PCI (Synergy 3 x 12 --> 3.6-3.3 mm). D2 occluded. RCA stents patent, 65 % OM2. EF 30-35%.  Marland Kitchen STEMI involving oth coronary artery of inferior wall ([place]) 03/2013   Secondary to subacute stent thrombosis of RCA stent segment having missed 4 doses of Effient.  . Tobacco abuse 01/11/2016  . Type II diabetes mellitus ([place]) 12/2010   sister and son also diabetic    No Known Allergies  Current Outpatient Medications on File Prior to Visit  Medication Sig Dispense Refill  . aspirin EC 81 MG tablet Take 1 tablet (81 mg total) by mouth daily. [place]  tablet 3  . blood glucose meter kit and supplies Dispense based on patient and insurance preference. Use up to four times daily as directed. (FOR ICD-10 E10.9, E11.9). 1 each 0  . Blood Glucose Monitoring Suppl (TRUE METRIX METER) DEVI 1 kit by Does not apply route 4 (four) times daily. 1 Device 0  . glucose blood (TRUE METRIX BLOOD GLUCOSE TEST) test strip Use as instructed 100 each 12  . nitroGLYCERIN (NITROSTAT) 0.4 MG SL tablet Place 1 tablet (0.4 mg total) under the tongue every 5 (five) minutes as needed for  chest pain. Reported on 11/25/2015 25 tablet 3  . ticagrelor (BRILINTA) 90 MG TABS tablet Take 1 tablet (90 mg total) by mouth 2 (two) times daily. 60 tablet 11  . TRUEPLUS LANCETS 28G MISC 28 g by Does not apply route 4 (four) times daily. 120 each 2  . cephALEXin (KEFLEX) 500 MG capsule Take 1 capsule (500 mg total) by mouth 2 (two) times daily. (Patient not taking: Reported on 11/07/2018) 10 capsule 0  . Tdap (BOOSTRIX) 5-2.5-18.5 LF-MCG/0.5 injection Inject 0.5 mLs into the muscle as directed. (Patient not taking: Reported on 11/21/2018) 0.5 mL 0   No current facility-administered medications on file prior to visit.     Observations/Objective: Alert, awake, oriented x3 Not in acute distress   CMP Latest Ref Rng & Units 07/05/2018 07/04/2018 07/03/2018  Glucose 70 - 99 mg/dL 217(H) 375(H) 352(H)  BUN 6 - 20 mg/dL 12 8 8   Creatinine 0.44 - 1.00 mg/dL 0.74 0.69 0.40(L)  Sodium 135 - 145 mmol/L 137 134(L) 136  Potassium 3.5 - 5.1 mmol/L 3.5 3.9 3.7  Chloride 98 - 111 mmol/L 104 102 101  CO2 22 - 32 mmol/L 22 22 -  Calcium 8.9 - 10.3 mg/dL 8.7(L) 8.7(L) -  Total Protein 6.5 - 8.1 g/dL - - -  Total Bilirubin 0.3 - 1.2 mg/dL - - -  Alkaline Phos 38 - 126 U/L - - -  AST 15 - 41 U/L - - -  ALT 14 - 54 U/L - - -    Lipid Panel     Component Value Date/Time   CHOL 164 07/05/2018 0330   TRIG 73 07/05/2018 0330   HDL 39 (L) 07/05/2018 0330   CHOLHDL 4.2 07/05/2018 0330   VLDL 15 07/05/2018 0330   LDLCALC 110 (H) 07/05/2018 0330    Lab Results  Component Value Date   HGBA1C 9.9 (H) 07/04/2018     Assessment and Plan: 1. Uncontrolled type 2 diabetes mellitus with hyperglycemia (HCC) Uncontrolled with A1c of 9.9 Increase Lantus dose Counseled on Diabetic diet, my plate method, 836 minutes of moderate intensity exercise/week Keep blood sugar logs with fasting goals of 80-120 mg/dl, random of less than 180 and in the event of sugars less than 60 mg/dl or greater than 400 mg/dl please  notify the clinic ASAP. It is recommended that you undergo annual eye exams and annual foot exams. Pneumonia vaccine is recommended. - Insulin Glargine (LANTUS SOLOSTAR) 100 UNIT/ML Solostar Pen; Inject 30 Units into the skin daily.  Dispense: 30 mL; Refill: 3 - glipiZIDE (GLUCOTROL) 10 MG tablet; Take 1 tablet (10 mg total) by mouth 2 (two) times daily before a meal.  Dispense: 180 tablet; Refill: 1  2. Hypertensive heart disease with chronic combined systolic and diastolic congestive heart failure (HCC) EF of 35% Euvolemic Continue fluid restriction, daily weights - lisinopril (ZESTRIL) 10 MG tablet; Take 1 tablet (10 mg total) by mouth daily.  Dispense: 30 tablet; Refill: 6 - carvedilol (COREG) 3.125 MG tablet; Take 1 tablet (3.125 mg total) by mouth 2 (two) times daily.  Dispense: 180 tablet; Refill: 1  3. Chronic combined systolic (congestive) and diastolic (congestive) heart failure ([place]) See above  4. ASCUS with positive high risk HPV cervical Encouraged to call the GYN office to schedule an appointment for cystoscopy  5. Vaginal candidiasis Placed on Diflucan   Follow Up Instructions: Return in about 3 months (around 02/20/2019).    I discussed the assessment and treatment plan with the patient. The patient was provided an opportunity to ask questions and all were answered. The patient agreed with the plan and demonstrated an understanding of the instructions.   The patient was advised to call back or seek an in-person evaluation if the symptoms worsen or if the condition fails to improve as anticipated.     I provided 25 minutes total of non-face-to-face time during this encounter including median intraservice time, reviewing previous notes, labs, imaging, medications and explaining diagnosis and management.     [name], MD, FAAFP. [facility] and [place], [place]   11/21/2018, 12:43 PM

## 2018-11-26 MED FILL — BRILINTA 90 MG TABLET: 90 | 30 days supply | Qty: 60 | Fill #2

## 2018-11-27 ENCOUNTER — Telehealth: Payer: Self-pay | Admitting: Cardiology

## 2018-11-27 NOTE — Telephone Encounter (Signed)
Telephone only/verbal consent 4/29/20vitals/recall//

## 2018-11-28 ENCOUNTER — Other Ambulatory Visit: Payer: Self-pay

## 2018-11-28 ENCOUNTER — Telehealth: Payer: Medicaid Other | Admitting: Cardiology

## 2018-11-29 DIAGNOSIS — I5181 Takotsubo syndrome: Secondary | ICD-10-CM

## 2018-11-29 HISTORY — DX: Takotsubo syndrome: I51.81

## 2018-12-10 ENCOUNTER — Inpatient Hospital Stay: Admission: RE | Admit: 2018-12-10 | Payer: Medicaid Other | Source: Ambulatory Visit

## 2018-12-18 ENCOUNTER — Emergency Department (HOSPITAL_COMMUNITY): Payer: Self-pay

## 2018-12-18 ENCOUNTER — Inpatient Hospital Stay (HOSPITAL_COMMUNITY): Payer: Self-pay

## 2018-12-18 ENCOUNTER — Other Ambulatory Visit: Payer: Self-pay

## 2018-12-18 ENCOUNTER — Inpatient Hospital Stay (HOSPITAL_COMMUNITY)
Admission: EM | Admit: 2018-12-18 | Discharge: 2018-12-22 | DRG: 291 | Disposition: A | Discharge status: Home / Self Care | Payer: Self-pay | Attending: Internal Medicine | Admitting: Internal Medicine

## 2018-12-18 ENCOUNTER — Encounter (HOSPITAL_COMMUNITY): Payer: Self-pay | Admitting: Cardiology

## 2018-12-18 DIAGNOSIS — IMO0002 Reserved for concepts with insufficient information to code with codable children: Secondary | ICD-10-CM | POA: Diagnosis present

## 2018-12-18 DIAGNOSIS — J811 Chronic pulmonary edema: Secondary | ICD-10-CM | POA: Diagnosis present

## 2018-12-18 DIAGNOSIS — I5043 Acute on chronic combined systolic (congestive) and diastolic (congestive) heart failure: Secondary | ICD-10-CM

## 2018-12-18 DIAGNOSIS — I371 Nonrheumatic pulmonary valve insufficiency: Secondary | ICD-10-CM

## 2018-12-18 DIAGNOSIS — Z9114 Patient's other noncompliance with medication regimen: Secondary | ICD-10-CM

## 2018-12-18 DIAGNOSIS — R55 Syncope and collapse: Secondary | ICD-10-CM | POA: Diagnosis not present

## 2018-12-18 DIAGNOSIS — T502X5A Adverse effect of carbonic-anhydrase inhibitors, benzothiadiazides and other diuretics, initial encounter: Secondary | ICD-10-CM | POA: Diagnosis not present

## 2018-12-18 DIAGNOSIS — Z794 Long term (current) use of insulin: Secondary | ICD-10-CM

## 2018-12-18 DIAGNOSIS — F17210 Nicotine dependence, cigarettes, uncomplicated: Secondary | ICD-10-CM | POA: Diagnosis present

## 2018-12-18 DIAGNOSIS — E1165 Type 2 diabetes mellitus with hyperglycemia: Secondary | ICD-10-CM | POA: Diagnosis present

## 2018-12-18 DIAGNOSIS — I252 Old myocardial infarction: Secondary | ICD-10-CM

## 2018-12-18 DIAGNOSIS — J9601 Acute respiratory failure with hypoxia: Secondary | ICD-10-CM | POA: Diagnosis present

## 2018-12-18 DIAGNOSIS — I2510 Atherosclerotic heart disease of native coronary artery without angina pectoris: Secondary | ICD-10-CM | POA: Diagnosis present

## 2018-12-18 DIAGNOSIS — J9602 Acute respiratory failure with hypercapnia: Secondary | ICD-10-CM | POA: Diagnosis present

## 2018-12-18 DIAGNOSIS — Z833 Family history of diabetes mellitus: Secondary | ICD-10-CM

## 2018-12-18 DIAGNOSIS — E1122 Type 2 diabetes mellitus with diabetic chronic kidney disease: Secondary | ICD-10-CM | POA: Diagnosis present

## 2018-12-18 DIAGNOSIS — I959 Hypotension, unspecified: Secondary | ICD-10-CM | POA: Diagnosis not present

## 2018-12-18 DIAGNOSIS — Z7982 Long term (current) use of aspirin: Secondary | ICD-10-CM

## 2018-12-18 DIAGNOSIS — I340 Nonrheumatic mitral (valve) insufficiency: Secondary | ICD-10-CM

## 2018-12-18 DIAGNOSIS — Z720 Tobacco use: Secondary | ICD-10-CM

## 2018-12-18 DIAGNOSIS — I110 Hypertensive heart disease with heart failure: Secondary | ICD-10-CM

## 2018-12-18 DIAGNOSIS — I161 Hypertensive emergency: Secondary | ICD-10-CM

## 2018-12-18 DIAGNOSIS — N182 Chronic kidney disease, stage 2 (mild): Secondary | ICD-10-CM | POA: Diagnosis present

## 2018-12-18 DIAGNOSIS — Z9049 Acquired absence of other specified parts of digestive tract: Secondary | ICD-10-CM

## 2018-12-18 DIAGNOSIS — E872 Acidosis: Secondary | ICD-10-CM | POA: Diagnosis present

## 2018-12-18 DIAGNOSIS — I255 Ischemic cardiomyopathy: Secondary | ICD-10-CM | POA: Diagnosis present

## 2018-12-18 DIAGNOSIS — N179 Acute kidney failure, unspecified: Secondary | ICD-10-CM | POA: Diagnosis present

## 2018-12-18 DIAGNOSIS — J810 Acute pulmonary edema: Secondary | ICD-10-CM

## 2018-12-18 DIAGNOSIS — I5042 Chronic combined systolic (congestive) and diastolic (congestive) heart failure: Secondary | ICD-10-CM

## 2018-12-18 DIAGNOSIS — I25119 Atherosclerotic heart disease of native coronary artery with unspecified angina pectoris: Secondary | ICD-10-CM

## 2018-12-18 DIAGNOSIS — E875 Hyperkalemia: Secondary | ICD-10-CM | POA: Diagnosis present

## 2018-12-18 DIAGNOSIS — E876 Hypokalemia: Secondary | ICD-10-CM | POA: Diagnosis not present

## 2018-12-18 DIAGNOSIS — Z955 Presence of coronary angioplasty implant and graft: Secondary | ICD-10-CM

## 2018-12-18 DIAGNOSIS — E785 Hyperlipidemia, unspecified: Secondary | ICD-10-CM | POA: Diagnosis present

## 2018-12-18 DIAGNOSIS — I130 Hypertensive heart and chronic kidney disease with heart failure and stage 1 through stage 4 chronic kidney disease, or unspecified chronic kidney disease: Principal | ICD-10-CM | POA: Diagnosis present

## 2018-12-18 DIAGNOSIS — Z79899 Other long term (current) drug therapy: Secondary | ICD-10-CM

## 2018-12-18 DIAGNOSIS — Z1159 Encounter for screening for other viral diseases: Secondary | ICD-10-CM

## 2018-12-18 HISTORY — PX: TRANSTHORACIC ECHOCARDIOGRAM: SHX275

## 2018-12-18 LAB — COMPREHENSIVE METABOLIC PANEL
ALT: 27 U/L (ref 0–44)
AST: 44 U/L — ABNORMAL HIGH (ref 15–41)
Albumin: 3.4 g/dL — ABNORMAL LOW (ref 3.5–5.0)
Alkaline Phosphatase: 54 U/L (ref 38–126)
Anion gap: 17 — ABNORMAL HIGH (ref 5–15)
BUN: 16 mg/dL (ref 6–20)
CO2: 18 mmol/L — ABNORMAL LOW (ref 22–32)
Calcium: 9.1 mg/dL (ref 8.9–10.3)
Chloride: 103 mmol/L (ref 98–111)
Creatinine, Ser: 1.17 mg/dL — ABNORMAL HIGH (ref 0.44–1.00)
GFR calc Af Amer: >60 mL/min (ref >60)
GFR calc non Af Amer: 55 mL/min — ABNORMAL LOW (ref >60)
Glucose, Bld: 332 mg/dL — ABNORMAL HIGH (ref 70–99)
Potassium: 3.6 mmol/L (ref 3.5–5.1)
Sodium: 138 mmol/L (ref 135–145)
Total Bilirubin: 0.7 mg/dL (ref 0.3–1.2)
Total Protein: 8.2 g/dL — ABNORMAL HIGH (ref 6.5–8.1)

## 2018-12-18 LAB — TROPONIN I
Troponin I: 0.03 ng/mL (ref <0.03)
Troponin I: <0.03 ng/mL (ref <0.03)
Troponin I: 0.03 ng/mL (ref <0.03)

## 2018-12-18 LAB — ETHANOL: Alcohol, Ethyl (B): <10 mg/dL (ref <10)

## 2018-12-18 LAB — ECHOCARDIOGRAM COMPLETE
Height: 66.5 in
Weight: 3312 oz

## 2018-12-18 LAB — CBC WITH DIFFERENTIAL/PLATELET
Abs Immature Granulocytes: 0.21 10*3/uL — ABNORMAL HIGH (ref 0.00–0.07)
Basophils Absolute: 0.2 10*3/uL — ABNORMAL HIGH (ref 0.0–0.1)
Basophils Relative: 1 %
Eosinophils Absolute: 0.5 10*3/uL (ref 0.0–0.5)
Eosinophils Relative: 3 %
HCT: 41 % (ref 36.0–46.0)
Hemoglobin: 12.6 g/dL (ref 12.0–15.0)
Immature Granulocytes: 1 %
Lymphocytes Relative: 40 %
Lymphs Abs: 8 10*3/uL — ABNORMAL HIGH (ref 0.7–4.0)
MCH: 26.8 pg (ref 26.0–34.0)
MCHC: 30.7 g/dL (ref 30.0–36.0)
MCV: 87 fL (ref 80.0–100.0)
Monocytes Absolute: 1 10*3/uL (ref 0.1–1.0)
Monocytes Relative: 5 %
Neutro Abs: 10 10*3/uL — ABNORMAL HIGH (ref 1.7–7.7)
Neutrophils Relative %: 50 %
Platelets: 414 10*3/uL — ABNORMAL HIGH (ref 150–400)
RBC: 4.71 MIL/uL (ref 3.87–5.11)
RDW: 14.8 % (ref 11.5–15.5)
WBC: 19.8 10*3/uL — ABNORMAL HIGH (ref 4.0–10.5)
nRBC: 0 % (ref 0.0–0.2)

## 2018-12-18 LAB — POCT I-STAT 7, (LYTES, BLD GAS, ICA,H+H)
Acid-base deficit: 7 mmol/L — ABNORMAL HIGH (ref 0.0–2.0)
Acid-base deficit: 5 mmol/L — ABNORMAL HIGH (ref 0.0–2.0)
Acid-base deficit: 2 mmol/L (ref 0.0–2.0)
Bicarbonate: 22.5 mmol/L (ref 20.0–28.0)
Bicarbonate: 22.9 mmol/L (ref 20.0–28.0)
Bicarbonate: 23 mmol/L (ref 20.0–28.0)
Calcium, Ion: 1.25 mmol/L (ref 1.15–1.40)
Calcium, Ion: 1.21 mmol/L (ref 1.15–1.40)
Calcium, Ion: 1.17 mmol/L (ref 1.15–1.40)
HCT: 38 % (ref 36.0–46.0)
HCT: 38 % (ref 36.0–46.0)
HCT: 34 % — ABNORMAL LOW (ref 36.0–46.0)
Hemoglobin: 12.9 g/dL (ref 12.0–15.0)
Hemoglobin: 12.9 g/dL (ref 12.0–15.0)
Hemoglobin: 11.6 g/dL — ABNORMAL LOW (ref 12.0–15.0)
O2 Saturation: 77 %
O2 Saturation: 91 %
O2 Saturation: 100 %
Patient temperature: 97.6
Potassium: 3.7 mmol/L (ref 3.5–5.1)
Potassium: 3.9 mmol/L (ref 3.5–5.1)
Potassium: 5.8 mmol/L — ABNORMAL HIGH (ref 3.5–5.1)
Sodium: 139 mmol/L (ref 135–145)
Sodium: 137 mmol/L (ref 135–145)
Sodium: 135 mmol/L (ref 135–145)
TCO2: 24 mmol/L (ref 22–32)
TCO2: 24 mmol/L (ref 22–32)
TCO2: 24 mmol/L (ref 22–32)
pCO2 arterial: 59.4 mmHg — ABNORMAL HIGH (ref 32.0–48.0)
pCO2 arterial: 51 mmHg — ABNORMAL HIGH (ref 32.0–48.0)
pCO2 arterial: 37 mmHg (ref 32.0–48.0)
pH, Arterial: 7.186 — CL (ref 7.350–7.450)
pH, Arterial: 7.261 — ABNORMAL LOW (ref 7.350–7.450)
pH, Arterial: 7.399 (ref 7.350–7.450)
pO2, Arterial: 53 mmHg — ABNORMAL LOW (ref 83.0–108.0)
pO2, Arterial: 70 mmHg — ABNORMAL LOW (ref 83.0–108.0)
pO2, Arterial: 391 mmHg — ABNORMAL HIGH (ref 83.0–108.0)

## 2018-12-18 LAB — MAGNESIUM
Magnesium: 2.4 mg/dL (ref 1.7–2.4)
Magnesium: 2.1 mg/dL (ref 1.7–2.4)

## 2018-12-18 LAB — C-REACTIVE PROTEIN: CRP: 1.1 mg/dL — ABNORMAL HIGH (ref <1.0)

## 2018-12-18 LAB — LACTIC ACID, PLASMA
Lactic Acid, Venous: 5.4 mmol/L (ref 0.5–1.9)
Lactic Acid, Venous: 1.9 mmol/L (ref 0.5–1.9)

## 2018-12-18 LAB — TRIGLYCERIDES: Triglycerides: 85 mg/dL (ref <150)

## 2018-12-18 LAB — LACTATE DEHYDROGENASE: LDH: 193 U/L — ABNORMAL HIGH (ref 98–192)

## 2018-12-18 LAB — D-DIMER, QUANTITATIVE: D-Dimer, Quant: 2.06 ug/mL-FEU — ABNORMAL HIGH (ref 0.00–0.50)

## 2018-12-18 LAB — GLUCOSE, CAPILLARY
Glucose-Capillary: 349 mg/dL — ABNORMAL HIGH (ref 70–99)
Glucose-Capillary: 285 mg/dL — ABNORMAL HIGH (ref 70–99)
Glucose-Capillary: 301 mg/dL — ABNORMAL HIGH (ref 70–99)
Glucose-Capillary: 247 mg/dL — ABNORMAL HIGH (ref 70–99)

## 2018-12-18 LAB — MRSA PCR SCREENING: MRSA by PCR: NEGATIVE

## 2018-12-18 LAB — PHOSPHORUS
Phosphorus: 3.5 mg/dL (ref 2.5–4.6)
Phosphorus: 3.8 mg/dL (ref 2.5–4.6)

## 2018-12-18 LAB — BRAIN NATRIURETIC PEPTIDE: B Natriuretic Peptide: 803.2 pg/mL — ABNORMAL HIGH (ref 0.0–100.0)

## 2018-12-18 LAB — I-STAT BETA HCG BLOOD, ED (MC, WL, AP ONLY): I-stat hCG, quantitative: <5 m[IU]/mL (ref <5)

## 2018-12-18 LAB — FERRITIN: Ferritin: 248 ng/mL (ref 11–307)

## 2018-12-18 LAB — SARS CORONAVIRUS 2 BY RT PCR (HOSPITAL ORDER, PERFORMED IN ~~LOC~~ HOSPITAL LAB): SARS Coronavirus 2: NEGATIVE

## 2018-12-18 LAB — FIBRINOGEN: Fibrinogen: 530 mg/dL — ABNORMAL HIGH (ref 210–475)

## 2018-12-18 LAB — PATHOLOGIST SMEAR REVIEW

## 2018-12-18 LAB — PROCALCITONIN: Procalcitonin: <0.1 ng/mL

## 2018-12-18 MED ORDER — VANCOMYCIN HCL IN DEXTROSE 1-5 GM/200ML-% IV SOLN: 1000.0000 mg | Freq: Once | INTRAVENOUS | Status: DC

## 2018-12-18 MED ORDER — INSULIN DETEMIR 100 UNIT/ML ~~LOC~~ SOLN
10.0000 [IU] | Freq: Every day | SUBCUTANEOUS | Status: DC
  Administered 2018-12-18: 10:00:00 10 [IU] via SUBCUTANEOUS
  Filled 2018-12-18: qty 0.1

## 2018-12-18 MED ORDER — SODIUM CHLORIDE 0.9 % IV SOLN
INTRAVENOUS | Status: DC | PRN
  Administered 2018-12-18: 08:00:00 via INTRAVENOUS
  Administered 2018-12-18: 04:00:00 500 mL via INTRAVENOUS

## 2018-12-18 MED ORDER — METRONIDAZOLE IN NACL 5-0.79 MG/ML-% IV SOLN
500.0000 mg | Freq: Once | INTRAVENOUS | Status: AC
  Administered 2018-12-18: 500 mg via INTRAVENOUS
  Filled 2018-12-18: qty 100

## 2018-12-18 MED ORDER — FUROSEMIDE 10 MG/ML IJ SOLN
INTRAMUSCULAR | Status: AC
  Filled 2018-12-18: qty 2

## 2018-12-18 MED ORDER — INSULIN DETEMIR 100 UNIT/ML ~~LOC~~ SOLN
10.0000 [IU] | Freq: Two times a day (BID) | SUBCUTANEOUS | Status: DC
  Administered 2018-12-18 – 2018-12-21 (×7): 10 [IU] via SUBCUTANEOUS
  Filled 2018-12-18 (×10): qty 0.1

## 2018-12-18 MED ORDER — VITAL HIGH PROTEIN PO LIQD
1000.0000 mL | ORAL | Status: DC
  Administered 2018-12-18: 12:00:00 1000 mL

## 2018-12-18 MED ORDER — ACETAMINOPHEN 325 MG PO TABS
650.0000 mg | ORAL_TABLET | ORAL | Status: DC | PRN
  Administered 2018-12-21: 650 mg via ORAL
  Filled 2018-12-18: qty 2

## 2018-12-18 MED ORDER — POTASSIUM CHLORIDE 20 MEQ/15ML (10%) PO SOLN
40.0000 meq | Freq: Once | ORAL | Status: AC
  Administered 2018-12-18: 08:00:00 40 meq
  Filled 2018-12-18: qty 30

## 2018-12-18 MED ORDER — ORAL CARE MOUTH RINSE
15.0000 mL | OROMUCOSAL | Status: DC
  Administered 2018-12-18 – 2018-12-19 (×13): 15 mL via OROMUCOSAL

## 2018-12-18 MED ORDER — VANCOMYCIN HCL 10 G IV SOLR
2000.0000 mg | Freq: Once | INTRAVENOUS | Status: DC
  Administered 2018-12-18: 10:00:00 2000 mg via INTRAVENOUS
  Filled 2018-12-18: qty 2000

## 2018-12-18 MED ORDER — ONDANSETRON HCL 4 MG/2ML IJ SOLN: 4.0000 mg | Freq: Four times a day (QID) | INTRAMUSCULAR | Status: DC | PRN

## 2018-12-18 MED ORDER — PRO-STAT SUGAR FREE PO LIQD: 30.0000 mL | Freq: Two times a day (BID) | ORAL | Status: DC

## 2018-12-18 MED ORDER — ATORVASTATIN CALCIUM 80 MG PO TABS
80.0000 mg | ORAL_TABLET | Freq: Every day | ORAL | Status: DC
  Administered 2018-12-18 – 2018-12-22 (×5): 80 mg via ORAL
  Filled 2018-12-18 (×5): qty 1

## 2018-12-18 MED ORDER — HYDRALAZINE HCL 20 MG/ML IJ SOLN
10.0000 mg | INTRAMUSCULAR | Status: DC | PRN
  Administered 2018-12-19: 08:00:00 10 mg via INTRAVENOUS
  Filled 2018-12-18: qty 1

## 2018-12-18 MED ORDER — VANCOMYCIN HCL 10 G IV SOLR
2000.0000 mg | Freq: Once | INTRAVENOUS | Status: AC
  Administered 2018-12-18: 10:00:00 2000 mg via INTRAVENOUS
  Filled 2018-12-18: qty 2000

## 2018-12-18 MED ORDER — MAGNESIUM SULFATE 2 GM/50ML IV SOLN
2.0000 g | Freq: Once | INTRAVENOUS | Status: AC
  Administered 2018-12-18: 08:00:00 2 g via INTRAVENOUS
  Filled 2018-12-18: qty 50

## 2018-12-18 MED ORDER — ASPIRIN EC 81 MG PO TBEC: 81.0000 mg | DELAYED_RELEASE_TABLET | Freq: Every day | ORAL | Status: DC

## 2018-12-18 MED ORDER — PROPOFOL 1000 MG/100ML IV EMUL
INTRAVENOUS | Status: AC
  Administered 2018-12-18: 25 ug/kg/min via INTRAVENOUS
  Filled 2018-12-18: qty 100

## 2018-12-18 MED ORDER — FUROSEMIDE 10 MG/ML IJ SOLN
80.0000 mg | Freq: Two times a day (BID) | INTRAMUSCULAR | Status: DC
  Administered 2018-12-18 – 2018-12-20 (×5): 80 mg via INTRAVENOUS
  Filled 2018-12-18 (×5): qty 8

## 2018-12-18 MED ORDER — FUROSEMIDE 10 MG/ML IJ SOLN
INTRAMUSCULAR | Status: AC
  Filled 2018-12-18: qty 4

## 2018-12-18 MED ORDER — PROPOFOL 1000 MG/100ML IV EMUL
5.0000 ug/kg/min | INTRAVENOUS | Status: DC
  Administered 2018-12-18 (×3): 25 ug/kg/min via INTRAVENOUS
  Administered 2018-12-18 – 2018-12-19 (×2): 20 ug/kg/min via INTRAVENOUS
  Administered 2018-12-19: 02:00:00 35 ug/kg/min via INTRAVENOUS
  Filled 2018-12-18 (×6): qty 100

## 2018-12-18 MED ORDER — SODIUM CHLORIDE 0.9 % IV SOLN
2.0000 g | Freq: Once | INTRAVENOUS | Status: AC
  Administered 2018-12-18: 04:00:00 2 g via INTRAVENOUS
  Filled 2018-12-18: qty 2

## 2018-12-18 MED ORDER — CHLORHEXIDINE GLUCONATE 0.12% ORAL RINSE (MEDLINE KIT)
15.0000 mL | Freq: Two times a day (BID) | OROMUCOSAL | Status: DC
  Administered 2018-12-18 – 2018-12-19 (×3): 15 mL via OROMUCOSAL

## 2018-12-18 MED ORDER — VITAL HIGH PROTEIN PO LIQD: 1000.0000 mL | ORAL | Status: DC

## 2018-12-18 MED ORDER — TICAGRELOR 90 MG PO TABS
90.0000 mg | ORAL_TABLET | Freq: Two times a day (BID) | ORAL | Status: DC
  Administered 2018-12-18 – 2018-12-22 (×9): 90 mg via ORAL
  Filled 2018-12-18 (×9): qty 1

## 2018-12-18 MED ORDER — INSULIN ASPART 100 UNIT/ML ~~LOC~~ SOLN
0.0000 [IU] | SUBCUTANEOUS | Status: DC
  Administered 2018-12-18: 12:00:00 8 [IU] via SUBCUTANEOUS
  Administered 2018-12-18 (×2): 11 [IU] via SUBCUTANEOUS

## 2018-12-18 MED ORDER — PRO-STAT SUGAR FREE PO LIQD
60.0000 mL | Freq: Three times a day (TID) | ORAL | Status: DC
  Administered 2018-12-18 – 2018-12-19 (×4): 60 mL
  Filled 2018-12-18 (×4): qty 60

## 2018-12-18 MED ORDER — FUROSEMIDE 10 MG/ML IJ SOLN: 40.0000 mg | Freq: Once | INTRAMUSCULAR | Status: DC

## 2018-12-18 MED ORDER — ROCURONIUM BROMIDE 50 MG/5ML IV SOLN
INTRAVENOUS | Status: AC | PRN
  Administered 2018-12-18: 100 mg via INTRAVENOUS

## 2018-12-18 MED ORDER — ETOMIDATE 2 MG/ML IV SOLN
INTRAVENOUS | Status: AC | PRN
  Administered 2018-12-18: 20 mg via INTRAVENOUS

## 2018-12-18 MED ORDER — NITROGLYCERIN IN D5W 200-5 MCG/ML-% IV SOLN
INTRAVENOUS | Status: AC
  Administered 2018-12-18: 04:00:00 35 ug/min via INTRAVENOUS
  Filled 2018-12-18: qty 250

## 2018-12-18 MED ORDER — ONDANSETRON HCL 4 MG/2ML IJ SOLN
INTRAMUSCULAR | Status: AC
  Filled 2018-12-18: qty 2

## 2018-12-18 MED ORDER — NITROGLYCERIN IN D5W 200-5 MCG/ML-% IV SOLN
0.0000 ug/min | INTRAVENOUS | Status: DC
  Administered 2018-12-18: 04:00:00 35 ug/min via INTRAVENOUS
  Filled 2018-12-18: qty 250

## 2018-12-18 MED ORDER — INSULIN ASPART 100 UNIT/ML ~~LOC~~ SOLN
0.0000 [IU] | SUBCUTANEOUS | Status: DC
  Administered 2018-12-18 – 2018-12-19 (×2): 7 [IU] via SUBCUTANEOUS
  Administered 2018-12-19: 3 [IU] via SUBCUTANEOUS
  Administered 2018-12-19: 05:00:00 4 [IU] via SUBCUTANEOUS
  Administered 2018-12-19: 7 [IU] via SUBCUTANEOUS
  Administered 2018-12-19: 17:00:00 4 [IU] via SUBCUTANEOUS
  Administered 2018-12-20: 11 [IU] via SUBCUTANEOUS
  Administered 2018-12-20: 7 [IU] via SUBCUTANEOUS
  Administered 2018-12-20: 05:00:00 2 [IU] via SUBCUTANEOUS
  Administered 2018-12-20 (×2): 11 [IU] via SUBCUTANEOUS
  Administered 2018-12-20: 7 [IU] via SUBCUTANEOUS
  Administered 2018-12-21: 11 [IU] via SUBCUTANEOUS

## 2018-12-18 MED ORDER — PERFLUTREN LIPID MICROSPHERE
1.0000 mL | INTRAVENOUS | Status: AC | PRN
  Administered 2018-12-18: 2 mL via INTRAVENOUS
  Filled 2018-12-18: qty 10

## 2018-12-18 MED ORDER — ASPIRIN 81 MG PO CHEW
81.0000 mg | CHEWABLE_TABLET | Freq: Every day | ORAL | Status: DC
  Administered 2018-12-18 – 2018-12-22 (×5): 81 mg via ORAL
  Filled 2018-12-18 (×5): qty 1

## 2018-12-18 MED ORDER — PIPERACILLIN-TAZOBACTAM 3.375 G IVPB: 3.3750 g | Freq: Three times a day (TID) | INTRAVENOUS | Status: DC

## 2018-12-18 MED ORDER — CARVEDILOL 12.5 MG PO TABS
12.5000 mg | ORAL_TABLET | Freq: Two times a day (BID) | ORAL | Status: DC
  Administered 2018-12-18 – 2018-12-19 (×2): 12.5 mg via ORAL
  Filled 2018-12-18 (×2): qty 1

## 2018-12-18 MED ORDER — ENOXAPARIN SODIUM 40 MG/0.4ML ~~LOC~~ SOLN
40.0000 mg | SUBCUTANEOUS | Status: DC
  Administered 2018-12-19 – 2018-12-22 (×4): 40 mg via SUBCUTANEOUS
  Filled 2018-12-18 (×5): qty 0.4

## 2018-12-18 MED ORDER — FUROSEMIDE 10 MG/ML IJ SOLN: 60.0000 mg | Freq: Two times a day (BID) | INTRAMUSCULAR | Status: DC

## 2018-12-18 MED ORDER — HYDRALAZINE HCL 25 MG PO TABS
25.0000 mg | ORAL_TABLET | Freq: Three times a day (TID) | ORAL | Status: DC
  Administered 2018-12-18 – 2018-12-20 (×4): 25 mg via ORAL
  Filled 2018-12-18 (×5): qty 1

## 2018-12-18 NOTE — Progress Notes (Signed)
Assisted tele visit to patient with mother.  Vear Clock, RN

## 2018-12-18 NOTE — Progress Notes (Signed)
Inpatient Diabetes Program Recommendations  AACE/ADA: New Consensus Statement on Inpatient Glycemic Control (2015)  Target Ranges:  Prepandial:   less than 140 mg/dL      Peak postprandial:   less than 180 mg/dL (1-2 hours)      Critically ill patients:  140 - 180 mg/dL   Results for Amy Chaney, Amy Chaney (MRN [id number]) as of 12/18/2018 09:13  Ref. Range 12/18/2018 07:44  Glucose-Capillary Latest Ref Range: 70 - 99 mg/dL 349 (H) Novolog 11 units given    Review of Glycemic Control  Diabetes history: DM 2 Outpatient Diabetes medications: Lantus 30 units Daily, Glipizide 10 mg BID Current orders for Inpatient glycemic control: Levemir 10 units Daily, Novolog 0-15 units Q4 hours  Inpatient Diabetes Program Recommendations:    Admitting glucose in 300 range. Noted insulin orders started this am.   Please consider d/cing current order set and place patient on ICU Glycemic Control order set Phase 1 SQ insulin Novolog moderate scale 2-6 units Q4 hours.   Also consider increasing Levemir to 10 units BID.  Thanks,  Tama Headings RN, MSN, BC-ADM Inpatient Diabetes Coordinator Team Pager [phone] (8a-5p)

## 2018-12-18 NOTE — ED Notes (Signed)
ED TO INPATIENT HANDOFF REPORT  ED Nurse Name and Phone #:  Tish Frederickson [phone]  S Name/Age/Gender Amy Chaney 48 y.o. female Room/Bed: 031C/031C  Code Status   Code Status: Full Code  Home/SNF/Other Home Patient oriented to: Is this baseline? No   Triage Complete: Triage complete  Chief Complaint shob  Triage Note Pt arrived via [facility] for sudden onset shortness of breath.   Allergies No Known Allergies  Level of Care/Admitting Diagnosis ED Disposition    ED Disposition Condition [facility] Area: [place] [[id number]]  Level of Care: ICU [6]  Covid Evaluation: Person Under Investigation (PUI)  Isolation Risk Level: Comment  Comment: low risk , seems to be heart failure , covid pending  Diagnosis: Acute respiratory failure with hypoxia and hypercapnia [facility]) [[id number]]  Admitting Physician: Roxanne Mins [[id number]]  Attending Physician: Roxanne Mins [[id number]]  Estimated length of stay: 3 - 4 days  Certification:: I certify this patient will need inpatient services for at least 2 midnights  PT Class (Do Not Modify): Inpatient [101]  PT Acc Code (Do Not Modify): Private [1]       B Medical/Surgery History Past Medical History:  Diagnosis Date  . Coronary artery disease involving native coronary artery of native heart with angina pectoris ([place]) 01/2013   a. s/p PCI of the mid RCA 01/2013 //  b. LHC 9/14: EF 55%, RCA stent 100% occluded, LAD irregularities >>  subacute stent thrombosis, Promus DES PCI distal overlap // c) 09/2017 - Ant STEMI - LAD-D2 PCI - Successful PCI of LAD (3 overlapping DES), unable to restore flow down D2l that was stented as well.  Likely related to downstream dissection, but unable to rewire.  . Daily headache   . Depression   . Dyslipidemia, goal LDL below 70   . History of Doppler ultrasound    carotid bruit >> a. Carotid US 6/17: bilat ICA 1-39%  . Hypertension   . STEMI involving left anterior  descending coronary artery ([place]) 09/2017; 06/2018   a) Severe [place] 1, 1, 1 LAD-D2 lesion (complicated by post PTCA dissection/intramural hematoma) -successful extensive PCI of the LAD but unable to maintain patency of the stented D2. b) distal mLAD stent Edge 100% thrombosis --> PTCA & Overlapping DES PCI (Synergy 3 x 12 --> 3.6-3.3 mm). D2 occluded. RCA stents patent, 65 % OM2. EF 30-35%.  Marland Kitchen STEMI involving oth coronary artery of inferior wall ([place]) 03/2013   Secondary to subacute stent thrombosis of RCA stent segment having missed 4 doses of Effient.  . Tobacco abuse 01/11/2016  . Type II diabetes mellitus ([place]) 12/2010   sister and son also diabetic    Past Surgical History:  Procedure Laterality Date  . CHOLECYSTECTOMY  ~ 2000  . CORONARY STENT INTERVENTION N/A 10/10/2017   Procedure: CORONARY STENT INTERVENTION;  Surgeon: Leonie Man, MD;  Location: [place] CV LAB;  Service: Cardiovascular; LAD PCI: STENT SYNERGY DES 3.5X32 (p),STENT SYNERGY DES 3.5X 8 (m), STENT SYNERGY DES 3.5X28 (d).  D2 PCI: STENT SYNERGY DES 2.5X24.  (UNABL TO REWIRE & EXPAND - TO OF DIAG AT END OF CASE)  . CORONARY STENT INTERVENTION N/A 07/03/2018   Procedure: CORONARY STENT INTERVENTION;  Surgeon: Leonie Man, MD;  Location: [facility] LAB;; PTCA & overlapping DES PCI (overlaps prior stents distally) - Synergy DES 3 x 12 (3.6 mm @ overlap -- 3.3 mm distally)>  . LEFT HEART CATH AND CORONARY  ANGIOGRAPHY N/A 10/10/2017   Procedure: LEFT HEART CATH AND CORONARY ANGIOGRAPHY;  Surgeon: Leonie Man, MD;  Location: [place] CV LAB;  Service: Cardiovascular;  Laterality: N/A; -patent RCA stents with mild in-stent restenosis. [place] 1,1,1 mLD-D2 02% (complicated by dissection/intramural thrombus)  . LEFT HEART CATH AND CORONARY ANGIOGRAPHY N/A 07/03/2018   Procedure: LEFT HEART CATH AND CORONARY ANGIOGRAPHY;  Surgeon: Leonie Man, MD;  Location: [place] CV LAB;; 100% thrombotic occlusion distal  LAD stent -- DES PCI. continued 100% D2 (previously lost). patent RCA stents, 65% OM2.  EF 30-35%.  Marland Kitchen LEFT HEART CATHETERIZATION WITH CORONARY ANGIOGRAM N/A 02/25/2013   Procedure: LEFT HEART CATHETERIZATION WITH CORONARY ANGIOGRAM;  Surgeon: Laverda Page, MD;  Location: [facility] CATH LAB;  Service: Cardiovascular;; CTO m RCA; otherwise normal coronaries  . LEFT HEART CATHETERIZATION WITH CORONARY ANGIOGRAM N/A 04/01/2013   Procedure: LEFT HEART CATHETERIZATION WITH CORONARY ANGIOGRAM;  Surgeon: Laverda Page, MD;  Location: [facility] CATH LAB;  Service: Cardiovascular: 100% occlusion of distal RCA stent (subacute stent thrombosis -  secondary to stopping Effient).  Overlapping DES PCI  . NM MYOVIEW LTD  12/2015    EF 38%.  Hypertensive response to exercise (231/132 mmHg).  INTERMEDIATE RISK due to -diffuse hypokinesis and reduced EF.  No ischemia or infarction.  Marland Kitchen PERCUTANEOUS CORONARY STENT INTERVENTION (PCI-S)  04/01/2013   Procedure: PERCUTANEOUS CORONARY STENT INTERVENTION (PCI-S);  Surgeon: Laverda Page, MD;  Location: [facility] CATH LAB;  Service: Cardiovascular;; PCI of distal RCA stent subacute thrombosis: Promus Premier DES 3.0 mm x 20 mm.  Marland Kitchen PERCUTANEOUS CORONARY STENT INTERVENTION (PCI-S)  02/25/2013   Procedure: PERCUTANEOUS CORONARY STENT INTERVENTION (PCI-S);  Surgeon: Laverda Page, MD;  [facility] CATH LAB;  RCA CTO PCI with overlapping Promus DES: 3.0 mm x 38 mm, 3.5 mm x 79mm  . TRANSTHORACIC ECHOCARDIOGRAM  12/2015   mild LVH, EF 55-60%, no RWMA, Gr 1 DD, mild MR  . TRANSTHORACIC ECHOCARDIOGRAM  09/2017   In setting of anterior STEMI:  EF 35% with mild concentric hypertrophy.  GRII DD.  Anteroseptal, anterior and anterolateral walls hypokinetic.  Moderate MR.  . TRANSTHORACIC ECHOCARDIOGRAM  07/04/2018   recurrent Anterior STEMI: EF 35-40%.  Apical hypokinesis.  GRII DD.  No thrombus noted.  (Improved EF from 30% up to 35-40% from previous echo)  . TUBAL LIGATION  1994     A IV  Location/Drains/Wounds Patient Lines/Drains/Airways Status   Active Line/Drains/Airways    Name:   Placement date:   Placement time:   Site:   Days:   Peripheral IV 12/18/18 Right Hand   12/18/18    0340    Hand   less than 1   Peripheral IV 12/18/18 Right Antecubital   12/18/18    0515    Antecubital   less than 1   Peripheral IV 12/18/18 Left Wrist   12/18/18    0515    Wrist   less than 1   NG/OG Tube Nasogastric 16 Fr. Right mouth   12/18/18    0341    Right mouth   less than 1   External Urinary Catheter   12/18/18    0414    -   less than 1   Airway 7.5 mm   12/18/18    0320     less than 1          Intake/Output Last 24 hours  Intake/Output Summary (Last 24 hours) at 12/18/2018 0602 Last data filed  at 12/18/2018 0427 Gross per 24 hour  Intake 100 ml  Output -  Net 100 ml    Labs/Imaging Results for orders placed or performed during the hospital encounter of 12/18/18 (from the past 48 hour(s))  CBC with Differential     Status: Abnormal   Collection Time: 12/18/18  3:18 AM  Result Value Ref Range   WBC 19.8 (H) 4.0 - 10.5 K/uL   RBC 4.71 3.87 - 5.11 MIL/uL   Hemoglobin 12.6 12.0 - 15.0 g/dL   HCT 41.0 36.0 - 46.0 %   MCV 87.0 80.0 - 100.0 fL   MCH 26.8 26.0 - 34.0 pg   MCHC 30.7 30.0 - 36.0 g/dL   RDW 14.8 11.5 - 15.5 %   Platelets 414 (H) 150 - 400 K/uL   nRBC 0.0 0.0 - 0.2 %   Neutrophils Relative % 50 %   Neutro Abs 10.0 (H) 1.7 - 7.7 K/uL   Lymphocytes Relative 40 %   Lymphs Abs 8.0 (H) 0.7 - 4.0 K/uL   Monocytes Relative 5 %   Monocytes Absolute 1.0 0.1 - 1.0 K/uL   Eosinophils Relative 3 %   Eosinophils Absolute 0.5 0.0 - 0.5 K/uL   Basophils Relative 1 %   Basophils Absolute 0.2 (H) 0.0 - 0.1 K/uL   WBC Morphology ABSOLUTE LYMPHOCYTOSIS     Comment: ATYPICAL MONONUCLEAR CELLS   Immature Granulocytes 1 %   Abs Immature Granulocytes 0.21 (H) 0.00 - 0.07 K/uL    Comment: Performed at [facility], [address], [place], [place] [address]  Brain  natriuretic peptide     Status: Abnormal   Collection Time: 12/18/18  3:18 AM  Result Value Ref Range   B Natriuretic Peptide 803.2 (H) 0.0 - 100.0 pg/mL    Comment: Performed at [facility] [address] [place], [place] [address]  Troponin I - ONCE - STAT     Status: Abnormal   Collection Time: 12/18/18  3:18 AM  Result Value Ref Range   Troponin I 0.03 (HH) <0.03 ng/mL    Comment: CRITICAL RESULT CALLED TO, READ BACK BY AND VERIFIED WITH: Jaslene Marsteller,M RN 12/18/2018 0429 JORDANS Performed at [facility], [place] [address] [place], [place] [address]   Comprehensive metabolic panel     Status: Abnormal   Collection Time: 12/18/18  3:18 AM  Result Value Ref Range   Sodium 138 135 - 145 mmol/L   Potassium 3.6 3.5 - 5.1 mmol/L   Chloride 103 98 - 111 mmol/L   CO2 18 (L) 22 - 32 mmol/L   Glucose, Bld 332 (H) 70 - 99 mg/dL   BUN 16 6 - 20 mg/dL   Creatinine, Ser 1.17 (H) 0.44 - 1.00 mg/dL   Calcium 9.1 8.9 - 10.3 mg/dL   Total Protein 8.2 (H) 6.5 - 8.1 g/dL   Albumin 3.4 (L) 3.5 - 5.0 g/dL   AST 44 (H) 15 - 41 U/L   ALT 27 0 - 44 U/L   Alkaline Phosphatase 54 38 - 126 U/L   Total Bilirubin 0.7 0.3 - 1.2 mg/dL   GFR calc non Af Amer 55 (L) >60 mL/min   GFR calc Af Amer >60 >60 mL/min   Anion gap 17 (H) 5 - 15    Comment: Performed at [facility], [place] [address] [place],  [address]  D-dimer, quantitative     Status: Abnormal   Collection Time: 12/18/18  3:18 AM  Result Value Ref Range   D-Dimer, Quant 2.06 (H) 0.00 - 0.50 ug/mL-FEU    Comment: (NOTE) At the manufacturer cut-off of 0.50 ug/mL FEU, this assay has been documented to exclude PE with a sensitivity and negative predictive value of 97 to 99%.  At this time, this assay has not been approved by the FDA to exclude DVT/VTE. Results should be correlated with clinical presentation. Performed at [facility], [place] [address] [place], [place] [address]   Procalcitonin     Status: None    Collection Time: 12/18/18  3:18 AM  Result Value Ref Range   Procalcitonin <0.10 ng/mL    Comment:        Interpretation: PCT (Procalcitonin) <= 0.5 ng/mL: Systemic infection (sepsis) is not likely. Local bacterial infection is possible. (NOTE)       Sepsis PCT Algorithm           Lower Respiratory Tract                                      Infection PCT Algorithm    ----------------------------     ----------------------------         PCT < 0.25 ng/mL                PCT < 0.10 ng/mL         Strongly encourage             Strongly discourage   discontinuation of antibiotics    initiation of antibiotics    ----------------------------     -----------------------------       PCT 0.25 - 0.50 ng/mL            PCT 0.10 - 0.25 ng/mL               OR       >80% decrease in PCT            Discourage initiation of                                            antibiotics      Encourage discontinuation           of antibiotics    ----------------------------     -----------------------------         PCT >= 0.50 ng/mL              PCT 0.26 - 0.50 ng/mL               AND        <80% decrease in PCT             Encourage initiation of                                             antibiotics       Encourage continuation           of antibiotics    ----------------------------     -----------------------------        PCT >= 0.50 ng/mL                  PCT > 0.50 ng/mL  AND         increase in PCT                  Strongly encourage                                      initiation of antibiotics    Strongly encourage escalation           of antibiotics                                     -----------------------------                                           PCT <= 0.25 ng/mL                                                 OR                                        > 80% decrease in PCT                                     Discontinue / Do not initiate                                              antibiotics Performed at [facility], [address], [place], [place] [address]   Lactate dehydrogenase     Status: Abnormal   Collection Time: 12/18/18  3:18 AM  Result Value Ref Range   LDH 193 (H) 98 - 192 U/L    Comment: Performed at [facility], [place] [address], [place], [place] [address]  Ferritin     Status: None   Collection Time: 12/18/18  3:18 AM  Result Value Ref Range   Ferritin 248 11 - 307 ng/mL    Comment: Performed at [facility], [place] [address] [place], [place] [address]  Triglycerides     Status: None   Collection Time: 12/18/18  3:18 AM  Result Value Ref Range   Triglycerides 85 <150 mg/dL    Comment: Performed at [facility] [address] [place], [place] [address]  Fibrinogen     Status: Abnormal   Collection Time: 12/18/18  3:18 AM  Result Value Ref Range   Fibrinogen 530 (H) 210 - 475 mg/dL    Comment: Performed at [facility] [address] [place], [place] [address]  C-reactive protein     Status: Abnormal   Collection Time: 12/18/18  3:18 AM  Result Value Ref Range   CRP 1.1 (H) <1.0 mg/dL    Comment: Performed at [facility] [address] [place], [place] [address]  I-Stat beta  hCG blood, ED     Status: None   Collection Time: 12/18/18  3:19 AM  Result Value Ref Range   I-stat hCG, quantitative <5.0 <5 mIU/mL   Comment 3            Comment:   GEST. AGE      CONC.  (mIU/mL)   <=1 WEEK        5 - 50     2 WEEKS       50 - 500     3 WEEKS       100 - 10,000     4 WEEKS     1,000 - 30,000        FEMALE AND NON-PREGNANT FEMALE:     LESS THAN 5 mIU/mL   Lactic acid, plasma     Status: Abnormal   Collection Time: 12/18/18  3:28 AM  Result Value Ref Range   Lactic Acid, Venous 5.4 (HH) 0.5 - 1.9 mmol/L    Comment: CRITICAL RESULT CALLED TO, READ BACK BY AND VERIFIED WITH: Adley Mazurowski,M RN 12/18/2018 0409 JORDANS Performed at [facility], [place] [address] [place], [place] [address]   SARS  Coronavirus 2 [facility] order, Performed in [facility] hospital lab)     Status: None   Collection Time: 12/18/18  3:40 AM  Result Value Ref Range   SARS Coronavirus 2 NEGATIVE NEGATIVE    Comment: (NOTE) If result is NEGATIVE SARS-CoV-2 target nucleic acids are NOT DETECTED. The SARS-CoV-2 RNA is generally detectable in upper and lower  respiratory specimens during the acute phase of infection. The lowest  concentration of SARS-CoV-2 viral copies this assay can detect is 250  copies / mL. A negative result does not preclude SARS-CoV-2 infection  and should not be used as the sole basis for treatment or other  patient management decisions.  A negative result may occur with  improper specimen collection / handling, submission of specimen other  than nasopharyngeal swab, presence of viral mutation(s) within the  areas targeted by this assay, and inadequate number of viral copies  (<250 copies / mL). A negative result must be combined with clinical  observations, patient history, and epidemiological information. If result is POSITIVE SARS-CoV-2 target nucleic acids are DETECTED. The SARS-CoV-2 RNA is generally detectable in upper and lower  respiratory specimens dur ing the acute phase of infection.  Positive  results are indicative of active infection with SARS-CoV-2.  Clinical  correlation with patient history and other diagnostic information is  necessary to determine patient infection status.  Positive results do  not rule out bacterial infection or co-infection with other viruses. If result is PRESUMPTIVE POSTIVE SARS-CoV-2 nucleic acids MAY BE PRESENT.   A presumptive positive result was obtained on the submitted specimen  and confirmed on repeat testing.  While 2019 novel coronavirus  (SARS-CoV-2) nucleic acids may be present in the submitted sample  additional confirmatory testing may be necessary for epidemiological  and / or clinical management purposes  to differentiate  between  SARS-CoV-2 and other Sarbecovirus currently known to infect humans.  If clinically indicated additional testing with an alternate test  methodology [phone]) is advised. The SARS-CoV-2 RNA is generally  detectable in upper and lower respiratory sp ecimens during the acute  phase of infection. The expected result is Negative. Fact Sheet for Patients:  StrictlyIdeas.no Fact Sheet for Healthcare Providers: BankingDealers.co.za This test is not yet approved or cleared by the [place] FDA and has been authorized  for detection and/or diagnosis of SARS-CoV-2 by FDA under an Emergency Use Authorization (EUA).  This EUA will remain in effect (meaning this test can be used) for the duration of the COVID-19 declaration under Section 564(b)(1) of the Act, 21 U.S.C. section 360bbb-3(b)(1), unless the authorization is terminated or revoked sooner. Performed at [facility], [place] [address], [place], [place] [address]   I-STAT 7, (LYTES, BLD GAS, ICA, H+H)     Status: Abnormal   Collection Time: 12/18/18  3:44 AM  Result Value Ref Range   pH, Arterial 7.186 (LL) 7.350 - 7.450   pCO2 arterial 59.4 (H) 32.0 - 48.0 mmHg   pO2, Arterial 53.0 (L) 83.0 - 108.0 mmHg   Bicarbonate 22.5 20.0 - 28.0 mmol/L   TCO2 24 22 - 32 mmol/L   O2 Saturation 77.0 %   Acid-base deficit 7.0 (H) 0.0 - 2.0 mmol/L   Sodium 139 135 - 145 mmol/L   Potassium 3.7 3.5 - 5.1 mmol/L   Calcium, Ion 1.25 1.15 - 1.40 mmol/L   HCT 38.0 36.0 - 46.0 %   Hemoglobin 12.9 12.0 - 15.0 g/dL   Patient temperature HIDE    Sample type ARTERIAL    Comment NOTIFIED PHYSICIAN   I-STAT 7, (LYTES, BLD GAS, ICA, H+H)     Status: Abnormal   Collection Time: 12/18/18  4:47 AM  Result Value Ref Range   pH, Arterial 7.261 (L) 7.350 - 7.450   pCO2 arterial 51.0 (H) 32.0 - 48.0 mmHg   pO2, Arterial 70.0 (L) 83.0 - 108.0 mmHg   Bicarbonate 22.9 20.0 - 28.0 mmol/L   TCO2 24 22 - 32  mmol/L   O2 Saturation 91.0 %   Acid-base deficit 5.0 (H) 0.0 - 2.0 mmol/L   Sodium 137 135 - 145 mmol/L   Potassium 3.9 3.5 - 5.1 mmol/L   Calcium, Ion 1.21 1.15 - 1.40 mmol/L   HCT 38.0 36.0 - 46.0 %   Hemoglobin 12.9 12.0 - 15.0 g/dL   Patient temperature HIDE    Sample type ARTERIAL   Ethanol     Status: None   Collection Time: 12/18/18  5:04 AM  Result Value Ref Range   Alcohol, Ethyl (B) <10 <10 mg/dL    Comment: (NOTE) Lowest detectable limit for serum alcohol is 10 mg/dL. For medical purposes only. Performed at [facility], [place] [address] [place], [place] [address]    Dg Chest Portable 1 View  Result Date: 12/18/2018 CLINICAL DATA:  48 y/o F; intubation and OG tube placement for respiratory failure. EXAM: PORTABLE CHEST 1 VIEW COMPARISON:  07/03/2018 chest radiograph. FINDINGS: Cardiomegaly. Diffuse reticular and patchy opacities of the lungs. No pleural effusion or pneumothorax. Endotracheal tube tip projects 2.1 cm above the carina. Enteric tube tip extends below the field of view into the abdomen. Bones are unremarkable. IMPRESSION: 1. Endotracheal tube tip projects 2.1 cm above the carina. Enteric tube tip extends below the field of view into the abdomen. 2. Diffuse reticular and patchy opacities of the lungs, likely interstitial and alveolar pulmonary edema. Electronically Signed   By: Kristine Garbe M.D.   On: 12/18/2018 03:52    Pending Labs Unresulted Labs (From admission, onward)    Start     Ordered   12/19/18 3419  Basic metabolic panel  Daily,   R     12/18/18 0527   12/19/18 0500  CBC  Tomorrow morning,   R     12/18/18 3790  12/19/18 0500  Magnesium  Tomorrow morning,   R     12/18/18 0527   12/19/18 0500  Phosphorus  Tomorrow morning,   R     12/18/18 0527   12/18/18 0800  Troponin I - Once  Once,   R     12/18/18 0521   12/18/18 0522  HIV antibody (Routine Testing)  Tomorrow morning,   R     12/18/18 0521   12/18/18 0522  Urine  culture  Once,   R     12/18/18 0521   12/18/18 0522  Culture, respiratory (tracheal aspirate)  Once,   R     12/18/18 0521   12/18/18 0501  Urine rapid drug screen (hosp performed)  ONCE - STAT,   R     12/18/18 0500   12/18/18 0447  Blood gas, arterial  ONCE - STAT,   R     12/18/18 0447   12/18/18 0324  Lactic acid, plasma  Now then every 2 hours,   STAT     12/18/18 0325   12/18/18 0324  Urinalysis, Routine w reflex microscopic  ONCE - STAT,   STAT     12/18/18 0325   12/18/18 0318  Pathologist smear review  Once,   STAT     12/18/18 0318   12/18/18 0305  Blood culture (routine x 2)  BLOOD CULTURE X 2,   STAT     12/18/18 0304          Vitals/Pain Today's Vitals   12/18/18 0500 12/18/18 0505 12/18/18 0510 12/18/18 0515  BP: (!) 129/98 114/80 96/61 93/69   Pulse:      Resp: (!) 26 (!) 26 (!) 26 (!) 26  Temp:      TempSrc:      SpO2:      Weight:      Height:      PainSc:        Isolation Precautions No active isolations  Medications Medications  ondansetron (ZOFRAN) 4 MG/2ML injection (has no administration in time range)  nitroGLYCERIN 50 mg in dextrose 5 % 250 mL (0.2 mg/mL) infusion (0 mcg/min Intravenous Rate/Dose Change 12/18/18 0513)  propofol (DIPRIVAN) 1000 MG/100ML infusion (15 mcg/kg/min  93.9 kg Intravenous Rate/Dose Change 12/18/18 0513)  metroNIDAZOLE (FLAGYL) IVPB 500 mg (500 mg Intravenous New Bag/Given 12/18/18 0521)  vancomycin (VANCOCIN) 2,000 mg in sodium chloride 0.9 % 500 mL IVPB (has no administration in time range)  0.9 %  sodium chloride infusion (500 mLs Intravenous New Bag/Given 12/18/18 0356)  aspirin EC tablet 81 mg (has no administration in time range)  atorvastatin (LIPITOR) tablet 80 mg (has no administration in time range)  ticagrelor (BRILINTA) tablet 90 mg (has no administration in time range)  enoxaparin (LOVENOX) injection 40 mg (has no administration in time range)  acetaminophen (TYLENOL) tablet 650 mg (has no administration in  time range)  ondansetron (ZOFRAN) injection 4 mg (has no administration in time range)  insulin detemir (LEVEMIR) injection 10 Units (has no administration in time range)  insulin aspart (novoLOG) injection 0-15 Units (has no administration in time range)  furosemide (LASIX) injection 60 mg (has no administration in time range)  furosemide (LASIX) 10 MG/ML injection (has no administration in time range)  etomidate (AMIDATE) injection (20 mg Intravenous Given 12/18/18 0319)  rocuronium (ZEMURON) injection (100 mg Intravenous Given 12/18/18 0319)  ceFEPIme (MAXIPIME) 2 g in sodium chloride 0.9 % 100 mL IVPB (0 g Intravenous Stopped 12/18/18 0427)    Mobility  non-ambulatory     Focused Assessments Cardiac Assessment Handoff:  Cardiac Rhythm: Sinus tachycardia Lab Results  Component Value Date   TROPONINI 0.03 (HH) 12/18/2018   Lab Results  Component Value Date   DDIMER 2.06 (H) 12/18/2018   Does the Patient currently have chest pain? No     R Recommendations: See Admitting Provider Note  Report given to:   Additional Notes:

## 2018-12-18 NOTE — ED Notes (Signed)
Pt was placed on Bipap but then started vomiting frothy sputum and required suction

## 2018-12-18 NOTE — H&P (Addendum)
Name: Amy Chaney MRN: [id number] DOB: 03/18/1971 Cc: sob Date of service : 12/18/18   LOS: 0  [facility] / Critical Care Note   History of Present Illness:  48 yo female with hx of CAD, isquemic CMP , chronic systolic CHF EF 32%, STEMI in 12/19, HTN and hx of noncompliance with medications presenting with one day history of worsening shortness of breath. Arrived in the ED with sats in the 70's , SBP ~240, unable to communicate , placed briefly on BIPAP and started having frothy sputum and vomiting then she was emergently intubated.  The history is gathered my review of medical chart and information obtained by the ED staff.  She is currently intubated on FIO2 of 100% peep of 10 , sedated and on nitro drip.  12 point ROS done and negative , pertinent positives are mentioned in the HPI. This is limited due to lack of history. But per the ED staff her sister was updated and said that the patients son died last week.    Lines / Drains: 3 piv   Cultures:pending   Antibiotics: cefepime and vanco x 1 in the ED    Tests / Events: COVID-19 - negative    Past Medical History:  Diagnosis Date  . Coronary artery disease involving native coronary artery of native heart with angina pectoris ([place]) 01/2013   a. s/p PCI of the mid RCA 01/2013 //  b. LHC 9/14: EF 55%, RCA stent 100% occluded, LAD irregularities >>  subacute stent thrombosis, Promus DES PCI distal overlap // c) 09/2017 - Ant STEMI - LAD-D2 PCI - Successful PCI of LAD (3 overlapping DES), unable to restore flow down D2l that was stented as well.  Likely related to downstream dissection, but unable to rewire.  . Daily headache   . Depression   . Dyslipidemia, goal LDL below 70   . History of Doppler ultrasound    carotid bruit >> a. Carotid US 6/17: bilat ICA 1-39%  . Hypertension   . STEMI involving left anterior descending coronary artery ([place]) 09/2017; 06/2018   a) Severe [place] 1, 1, 1 LAD-D2 lesion (complicated by  post PTCA dissection/intramural hematoma) -successful extensive PCI of the LAD but unable to maintain patency of the stented D2. b) distal mLAD stent Edge 100% thrombosis --> PTCA & Overlapping DES PCI (Synergy 3 x 12 --> 3.6-3.3 mm). D2 occluded. RCA stents patent, 65 % OM2. EF 30-35%.  Marland Kitchen STEMI involving oth coronary artery of inferior wall ([place]) 03/2013   Secondary to subacute stent thrombosis of RCA stent segment having missed 4 doses of Effient.  . Tobacco abuse 01/11/2016  . Type II diabetes mellitus ([place]) 12/2010   sister and son also diabetic     Past Surgical History:  Procedure Laterality Date  . CHOLECYSTECTOMY  ~ 2000  . CORONARY STENT INTERVENTION N/A 10/10/2017   Procedure: CORONARY STENT INTERVENTION;  Surgeon: Leonie Man, MD;  Location: [facility];  Service: Cardiovascular; LAD PCI: STENT SYNERGY DES 3.5X32 (p),STENT SYNERGY DES 3.5X 8 (m), STENT SYNERGY DES 3.5X28 (d).  D2 PCI: STENT SYNERGY DES 2.5X24.  (UNABL TO REWIRE & EXPAND - TO OF DIAG AT END OF CASE)  . CORONARY STENT INTERVENTION N/A 07/03/2018   Procedure: CORONARY STENT INTERVENTION;  Surgeon: Leonie Man, MD;  Location: [facility];; PTCA & overlapping DES PCI (overlaps prior stents distally) - Synergy DES 3 x 12 (3.6 mm @ overlap -- 3.3 mm distally)>  .  LEFT HEART CATH AND CORONARY ANGIOGRAPHY N/A 10/10/2017   Procedure: LEFT HEART CATH AND CORONARY ANGIOGRAPHY;  Surgeon: Leonie Man, MD;  Location: [facility];  Service: Cardiovascular;  Laterality: N/A; -patent RCA stents with mild in-stent restenosis. [place] 1,1,1 mLD-D2 72% (complicated by dissection/intramural thrombus)  . LEFT HEART CATH AND CORONARY ANGIOGRAPHY N/A 07/03/2018   Procedure: LEFT HEART CATH AND CORONARY ANGIOGRAPHY;  Surgeon: Leonie Man, MD;  Location: [facility];; 100% thrombotic occlusion distal LAD stent -- DES PCI. continued 100% D2 (previously lost). patent RCA stents, 65% OM2.  EF 30-35%.  Marland Kitchen LEFT  HEART CATHETERIZATION WITH CORONARY ANGIOGRAM N/A 02/25/2013   Procedure: LEFT HEART CATHETERIZATION WITH CORONARY ANGIOGRAM;  Surgeon: Laverda Page, MD;  Location: [facility] CATH LAB;  Service: Cardiovascular;; CTO m RCA; otherwise normal coronaries  . LEFT HEART CATHETERIZATION WITH CORONARY ANGIOGRAM N/A 04/01/2013   Procedure: LEFT HEART CATHETERIZATION WITH CORONARY ANGIOGRAM;  Surgeon: Laverda Page, MD;  Location: [facility] CATH LAB;  Service: Cardiovascular: 100% occlusion of distal RCA stent (subacute stent thrombosis -  secondary to stopping Effient).  Overlapping DES PCI  . NM MYOVIEW LTD  12/2015    EF 38%.  Hypertensive response to exercise (231/132 mmHg).  INTERMEDIATE RISK due to -diffuse hypokinesis and reduced EF.  No ischemia or infarction.  Marland Kitchen PERCUTANEOUS CORONARY STENT INTERVENTION (PCI-S)  04/01/2013   Procedure: PERCUTANEOUS CORONARY STENT INTERVENTION (PCI-S);  Surgeon: Laverda Page, MD;  Location: [facility] CATH LAB;  Service: Cardiovascular;; PCI of distal RCA stent subacute thrombosis: Promus Premier DES 3.0 mm x 20 mm.  Marland Kitchen PERCUTANEOUS CORONARY STENT INTERVENTION (PCI-S)  02/25/2013   Procedure: PERCUTANEOUS CORONARY STENT INTERVENTION (PCI-S);  Surgeon: Laverda Page, MD;  Location: [facility] CATH LAB;  RCA CTO PCI with overlapping Promus DES: 3.0 mm x 38 mm, 3.5 mm x 59m  . TRANSTHORACIC ECHOCARDIOGRAM  12/2015   mild LVH, EF 55-60%, no RWMA, Gr 1 DD, mild MR  . TRANSTHORACIC ECHOCARDIOGRAM  09/2017   In setting of anterior STEMI:  EF 35% with mild concentric hypertrophy.  GRII DD.  Anteroseptal, anterior and anterolateral walls hypokinetic.  Moderate MR.  . TRANSTHORACIC ECHOCARDIOGRAM  07/04/2018   recurrent Anterior STEMI: EF 35-40%.  Apical hypokinesis.  GRII DD.  No thrombus noted.  (Improved EF from 30% up to 35-40% from previous echo)  . TUBAL LIGATION  1994    Prior to Admission medications   Medication Sig Start Date End Date Taking? Authorizing Provider  aspirin EC 81 MG  tablet Take 1 tablet (81 mg total) by mouth daily. 07/06/18   Bhagat, BCrista Luria PA  atorvastatin (LIPITOR) 80 MG tablet Take 1 tablet (80 mg total) by mouth daily. 11/21/18   NCharlott Rakes MD  blood glucose meter kit and supplies Dispense based on patient and insurance preference. Use up to four times daily as directed. (FOR ICD-10 E10.9, E11.9). 07/06/18   Bhagat, BCrista Luria PA  Blood Glucose Monitoring Suppl (TRUE METRIX METER) DEVI 1 kit by Does not apply route 4 (four) times daily. 10/24/17   NBrayton Caves PA-C  carvedilol (COREG) 3.125 MG tablet Take 1 tablet (3.125 mg total) by mouth 2 (two) times daily. 11/21/18   NCharlott Rakes MD  cephALEXin (KEFLEX) 500 MG capsule Take 1 capsule (500 mg total) by mouth 2 (two) times daily. Patient not taking: Reported on 11/07/2018 10/09/18   HVanessa Kick MD  glipiZIDE (GLUCOTROL) 10 MG tablet Take 1 tablet (10 mg total) by mouth  2 (two) times daily before a meal. 11/21/18   [name], MD  glucose blood (TRUE METRIX BLOOD GLUCOSE TEST) test strip Use as instructed 10/24/17   Brayton Caves, PA-C  Insulin Glargine (LANTUS SOLOSTAR) 100 UNIT/ML Solostar Pen Inject 30 Units into the skin daily. 11/21/18   [name], MD  lisinopril (ZESTRIL) 10 MG tablet Take 1 tablet (10 mg total) by mouth daily. 11/21/18   [name], MD  nitroGLYCERIN (NITROSTAT) 0.4 MG SL tablet Place 1 tablet (0.4 mg total) under the tongue every 5 (five) minutes as needed for chest pain. Reported on 11/25/2015 07/06/18   Leanor Kail, PA  Tdap (BOOSTRIX) 5-2.5-18.5 LF-MCG/0.5 injection Inject 0.5 mLs into the muscle as directed. Patient not taking: Reported on 11/21/2018 08/23/18   [name], MD  ticagrelor (BRILINTA) 90 MG TABS tablet Take 1 tablet (90 mg total) by mouth 2 (two) times daily. 07/05/18   Sande Rives E, PA-C  TRUEPLUS LANCETS 28G MISC 28 g by Does not apply route 4 (four) times daily. 07/06/18   Rai, Vernelle Emerald, MD    Allergies No Known  Allergies  Family History Family History  Problem Relation Age of Onset  . Diabetes Mother   . Diabetes Sister   . Diabetes Sister   . Diabetes Son        type 1  . Diabetes Son        type 2    Social History  reports that she has been smoking cigarettes. She has been smoking about 0.00 packs per day for the past 22.00 years. She has never used smokeless tobacco. She reports current alcohol use. She reports current drug use. Drug: Marijuana.  Review Of Systems:   Vital Signs: Temp:  [97.8 F (36.6 C)] 97.8 F (36.6 C) (05/20 0416) Pulse Rate:  [116] 116 (05/20 0303) Resp:  [20-40] 26 (05/20 0420) BP: (182-246)/(112-154) 182/112 (05/20 0420) SpO2:  [84 %-95 %] 92 % (05/20 0356) FiO2 (%):  [100 %] 100 % (05/20 0356) Weight:  [93.9 kg] 93.9 kg (05/20 0304) No intake/output data recorded.  Physical Examination: General: sedated on the ventilator , moves all extremities , does not follow commands Neuro:moves all ext , PERRLA , EOMI , neck is supple CV: tachycardic , no  M/rg , S3+ PULM: crackles bilaterally , no wheezing or ronchi GI: obese , soft , BS+ Extremities: trace pedal edema in the LOWER ext ,  No cyanosis or clubbing   Ventilator settings: Vent Mode: PRVC FiO2 (%):  [100 %] 100 % Set Rate:  [26 bmp] 26 bmp Vt Set:  [480 mL] 480 mL PEEP:  [10 cmH20] 10 cmH20 Plateau Pressure:  [27 cmH20] 27 cmH20  Labs    CBC Recent Labs  Lab 12/18/18 0318 12/18/18 0344 12/18/18 0447  HGB 12.6 12.9 12.9  HCT 41.0 38.0 38.0  WBC 19.8*  --   --   PLT 414*  --   --      BMET Recent Labs  Lab 12/18/18 0318 12/18/18 0344 12/18/18 0447  NA 138 139 137  K 3.6 3.7 3.9  CL 103  --   --   CO2 18*  --   --   GLUCOSE 332*  --   --   BUN 16  --   --   CREATININE 1.17*  --   --   CALCIUM 9.1  --   --     No results for input(s): INR in the last 168 hours.  Recent Labs  Lab 12/18/18 0344 12/18/18 0447  PHART 7.186* 7.261*  PCO2ART 59.4* 51.0*  PO2ART 53.0*  70.0*  HCO3 22.5 22.9  TCO2 24 24  O2SAT 77.0 91.0     Radiology: CXR - pulmonary edema diffusely    Assessment and Plan: Principal Problem:   Acute respiratory failure with hypoxia and hypercapnia (HCC) Active Problems:   Diabetes type 2, uncontrolled (HCC)   Coronary artery disease involving native coronary artery of native heart with angina pectoris (HCC)   Tobacco abuse   Acute on chronic combined systolic and diastolic CHF, NYHA class 3 (HCC)   Pulmonary edema  Neuro - sedated on the vent , propofol for RAAS -2  Check utox and etoh level  CVS - hypertensive urgency , acute on respiratory failure with hypoxemia and hypercarbia secondary to acute on chronic systolic heart failure. Etiology unclear but she has hx of non compliance with medications and recent stressor (son died 5 days ago) Nitro drip , aim SBP < 170 ASPIRIN/STATIN Hx of CAD , trop negative , repeat in 6 hrs  Lungs - acute resp failure with hypoxemia and hypercarbia secondary to pulmonary edema Full vent support Vent adjusted per ABG , now on 480/26/100/12 Lasix 60 mg iv stat CXR appears wet, will discontinue antibiotics , she got one dose in the ED Suspect she has OSA , she is also a smoker   Nephro - place foley and diurese , replace K and Magnesium Lactic acidosis , repeat , do not suspect sepsis related  Hem - H/H stable  ID - no signs of infection , suspect the cause being pulmonary edema Afebrile , COVID-19 negativef Check urinalysis , bcx , pct level   Endo - sliding scale q 4 hrs and low dose levemir while npo   Best practices / Disposition: -->Code Status: FULL -->DVT MA:[id number] -->GI FS:FSELTR -->Diet: Nutrition consult  Admit to ICU  Critical care time : 45 minutes  12/18/2018, 5:02 AM

## 2018-12-18 NOTE — ED Notes (Signed)
Amy Chaney pts daughter wants to know the status of her Mom @ [phone]

## 2018-12-18 NOTE — ED Provider Notes (Addendum)
[facility] EMERGENCY DEPARTMENT Provider Note   CSN: [id number] Arrival date & time: 12/18/18  0300    History   Chief Complaint Chief Complaint  Patient presents with  . Shortness of Breath    HPI Amy Chaney is a 48 y.o. female.     HPI  This is a 48 year old female with a history of coronary artery disease, systolic CHF with ischemic cardiomyopathy who presents with shortness of breath.  Per EMS, they were called out for acute onset of shortness of breath.  She was noted to be satting in the mid 70s on room air.  She was placed on CPAP and best O2 sat was 84%.  Patient denies any chest pain at this time.  She provides limited history because of her respiratory distress.  She reports shortness of breath.  Denies any recent fevers or cough.  Confirms acute onset of shortness of breath.  Level 5 caveat for acuity of condition  I reviewed the patient's chart.  She had a STEMI in December 2019.  She had resultant ischemic cardiomyopathy with an EF of 35%.  Past Medical History:  Diagnosis Date  . Coronary artery disease involving native coronary artery of native heart with angina pectoris ([place]) 01/2013   a. s/p PCI of the mid RCA 01/2013 //  b. LHC 9/14: EF 55%, RCA stent 100% occluded, LAD irregularities >>  subacute stent thrombosis, Promus DES PCI distal overlap // c) 09/2017 - Ant STEMI - LAD-D2 PCI - Successful PCI of LAD (3 overlapping DES), unable to restore flow down D2l that was stented as well.  Likely related to downstream dissection, but unable to rewire.  . Daily headache   . Depression   . Dyslipidemia, goal LDL below 70   . History of Doppler ultrasound    carotid bruit >> a. Carotid US 6/17: bilat ICA 1-39%  . Hypertension   . STEMI involving left anterior descending coronary artery ([place]) 09/2017; 06/2018   a) Severe Medina 1, 1, 1 LAD-D2 lesion (complicated by post PTCA dissection/intramural hematoma) -successful extensive PCI of the LAD but  unable to maintain patency of the stented D2. b) distal mLAD stent Edge 100% thrombosis --> PTCA & Overlapping DES PCI (Synergy 3 x 12 --> 3.6-3.3 mm). D2 occluded. RCA stents patent, 65 % OM2. EF 30-35%.  Marland Kitchen STEMI involving oth coronary artery of inferior wall ([place]) 03/2013   Secondary to subacute stent thrombosis of RCA stent segment having missed 4 doses of Effient.  . Tobacco abuse 01/11/2016  . Type II diabetes mellitus ([place]) 12/2010   sister and son also diabetic     Patient Active Problem List   Diagnosis Date Noted  . ASCUS with positive high risk HPV cervical 11/21/2018  . Acute on chronic combined systolic and diastolic CHF, NYHA class 3 ([place]) 07/05/2018  . Dyslipidemia, goal LDL below 70   . Acute ST elevation myocardial infarction (STEMI) involving left anterior descending (LAD) coronary artery ([place]) 07/03/2018  . Dark stools 04/03/2018  . Hypertension, accelerated, with diastolic congestive heart failure, NYHA class 1 ([place]) 12/30/2017  . STEMI involving left anterior descending coronary artery ([place]) 10/10/2017  . MI, acute, non ST segment elevation ([place])   . Chest pain 01/11/2016  . Carotid artery disease ([place]) 01/11/2016  . Prolonged Q-T interval on ECG 01/11/2016  . Tobacco abuse counseling 01/11/2016  . Heart palpitations 01/11/2016  . Tobacco abuse 01/11/2016  . Morbid obesity ([place]): BMI ~36 with HTN, HLD, CAD  11/25/2015  . Smoker 03/03/2015  . Coronary artery disease involving native coronary artery of native heart with angina pectoris ([place]) 12/09/2014  . Chronic combined systolic (congestive) and diastolic (congestive) heart failure ([place]) 12/09/2014  . H/O noncompliance with medical treatment, presenting hazards to health 12/09/2014  . S/P primary angioplasty with coronary stent 11/18/2014  . Diabetes type 2, uncontrolled ([place]) 04/01/2013  . Hyperlipidemia associated with type 2 diabetes mellitus ([place]) 04/01/2013  . Depression 04/01/2013  . Stress at home 04/01/2013   . Essential hypertension 09/05/2011  . Type II diabetes mellitus ([place]) 12/30/2010    Past Surgical History:  Procedure Laterality Date  . CHOLECYSTECTOMY  ~ 2000  . CORONARY STENT INTERVENTION N/A 10/10/2017   Procedure: CORONARY STENT INTERVENTION;  Surgeon: Leonie Man, MD;  Location: [facility];  Service: Cardiovascular; LAD PCI: STENT SYNERGY DES 3.5X32 (p),STENT SYNERGY DES 3.5X 8 (m), STENT SYNERGY DES 3.5X28 (d).  D2 PCI: STENT SYNERGY DES 2.5X24.  (UNABL TO REWIRE & EXPAND - TO OF DIAG AT END OF CASE)  . CORONARY STENT INTERVENTION N/A 07/03/2018   Procedure: CORONARY STENT INTERVENTION;  Surgeon: Leonie Man, MD;  Location: [facility];; PTCA & overlapping DES PCI (overlaps prior stents distally) - Synergy DES 3 x 12 (3.6 mm @ overlap -- 3.3 mm distally)>  . LEFT HEART CATH AND CORONARY ANGIOGRAPHY N/A 10/10/2017   Procedure: LEFT HEART CATH AND CORONARY ANGIOGRAPHY;  Surgeon: Leonie Man, MD;  Location: [facility];  Service: Cardiovascular;  Laterality: N/A; -patent RCA stents with mild in-stent restenosis. Medina 1,1,1 mLD-D2 21% (complicated by dissection/intramural thrombus)  . LEFT HEART CATH AND CORONARY ANGIOGRAPHY N/A 07/03/2018   Procedure: LEFT HEART CATH AND CORONARY ANGIOGRAPHY;  Surgeon: Leonie Man, MD;  Location: [facility];; 100% thrombotic occlusion distal LAD stent -- DES PCI. continued 100% D2 (previously lost). patent RCA stents, 65% OM2.  EF 30-35%.  Marland Kitchen LEFT HEART CATHETERIZATION WITH CORONARY ANGIOGRAM N/A 02/25/2013   Procedure: LEFT HEART CATHETERIZATION WITH CORONARY ANGIOGRAM;  Surgeon: Laverda Page, MD;  Location: [facility] CATH LAB;  Service: Cardiovascular;; CTO m RCA; otherwise normal coronaries  . LEFT HEART CATHETERIZATION WITH CORONARY ANGIOGRAM N/A 04/01/2013   Procedure: LEFT HEART CATHETERIZATION WITH CORONARY ANGIOGRAM;  Surgeon: Laverda Page, MD;  Location: [facility] CATH LAB;  Service: Cardiovascular: 100%  occlusion of distal RCA stent (subacute stent thrombosis -  secondary to stopping Effient).  Overlapping DES PCI  . NM MYOVIEW LTD  12/2015    EF 38%.  Hypertensive response to exercise (231/132 mmHg).  INTERMEDIATE RISK due to -diffuse hypokinesis and reduced EF.  No ischemia or infarction.  Marland Kitchen PERCUTANEOUS CORONARY STENT INTERVENTION (PCI-S)  04/01/2013   Procedure: PERCUTANEOUS CORONARY STENT INTERVENTION (PCI-S);  Surgeon: Laverda Page, MD;  Location: [facility] CATH LAB;  Service: Cardiovascular;; PCI of distal RCA stent subacute thrombosis: Promus Premier DES 3.0 mm x 20 mm.  Marland Kitchen PERCUTANEOUS CORONARY STENT INTERVENTION (PCI-S)  02/25/2013   Procedure: PERCUTANEOUS CORONARY STENT INTERVENTION (PCI-S);  Surgeon: Laverda Page, MD;  Location: [facility] CATH LAB;  RCA CTO PCI with overlapping Promus DES: 3.0 mm x 38 mm, 3.5 mm x 76m  . TRANSTHORACIC ECHOCARDIOGRAM  12/2015   mild LVH, EF 55-60%, no RWMA, Gr 1 DD, mild MR  . TRANSTHORACIC ECHOCARDIOGRAM  09/2017   In setting of anterior STEMI:  EF 35% with mild concentric hypertrophy.  GRII DD.  Anteroseptal, anterior and anterolateral walls hypokinetic.  Moderate MR.  . TRANSTHORACIC ECHOCARDIOGRAM  07/04/2018   recurrent Anterior STEMI: EF 35-40%.  Apical hypokinesis.  GRII DD.  No thrombus noted.  (Improved EF from 30% up to 35-40% from previous echo)  . TUBAL LIGATION  1994     OB History    Gravida  3   Para  3   Term  3   Preterm  0   AB  0   Living  3     SAB  0   TAB  0   Ectopic  0   Multiple  0   Live Births               Home Medications    Prior to Admission medications   Medication Sig Start Date End Date Taking? Authorizing Provider  aspirin EC 81 MG tablet Take 1 tablet (81 mg total) by mouth daily. 07/06/18   Bhagat, Crista Luria, PA  atorvastatin (LIPITOR) 80 MG tablet Take 1 tablet (80 mg total) by mouth daily. 11/21/18   [name], MD  blood glucose meter kit and supplies Dispense based on patient and  insurance preference. Use up to four times daily as directed. (FOR ICD-10 E10.9, E11.9). 07/06/18   Bhagat, Crista Luria, PA  Blood Glucose Monitoring Suppl (TRUE METRIX METER) DEVI 1 kit by Does not apply route 4 (four) times daily. 10/24/17   Brayton Caves, PA-C  carvedilol (COREG) 3.125 MG tablet Take 1 tablet (3.125 mg total) by mouth 2 (two) times daily. 11/21/18   [name], MD  cephALEXin (KEFLEX) 500 MG capsule Take 1 capsule (500 mg total) by mouth 2 (two) times daily. Patient not taking: Reported on 11/07/2018 10/09/18   Vanessa Kick, MD  glipiZIDE (GLUCOTROL) 10 MG tablet Take 1 tablet (10 mg total) by mouth 2 (two) times daily before a meal. 11/21/18   [name], MD  glucose blood (TRUE METRIX BLOOD GLUCOSE TEST) test strip Use as instructed 10/24/17   Brayton Caves, PA-C  Insulin Glargine (LANTUS SOLOSTAR) 100 UNIT/ML Solostar Pen Inject 30 Units into the skin daily. 11/21/18   [name], MD  lisinopril (ZESTRIL) 10 MG tablet Take 1 tablet (10 mg total) by mouth daily. 11/21/18   [name], MD  nitroGLYCERIN (NITROSTAT) 0.4 MG SL tablet Place 1 tablet (0.4 mg total) under the tongue every 5 (five) minutes as needed for chest pain. Reported on 11/25/2015 07/06/18   Leanor Kail, PA  Tdap (BOOSTRIX) 5-2.5-18.5 LF-MCG/0.5 injection Inject 0.5 mLs into the muscle as directed. Patient not taking: Reported on 11/21/2018 08/23/18   [name], MD  ticagrelor (BRILINTA) 90 MG TABS tablet Take 1 tablet (90 mg total) by mouth 2 (two) times daily. 07/05/18   Sande Rives E, PA-C  TRUEPLUS LANCETS 28G MISC 28 g by Does not apply route 4 (four) times daily. 07/06/18   Mendel Corning, MD    Family History Family History  Problem Relation Age of Onset  . Diabetes Mother   . Diabetes Sister   . Diabetes Sister   . Diabetes Son        type 1  . Diabetes Son        type 2    Social History Social History   Tobacco Use  . Smoking status: Current Every Day Smoker     Packs/day: 0.00    Years: 22.00    Pack years: 0.00    Types: Cigarettes  . Smokeless tobacco: Never Used  . Tobacco comment: pt  states she smokes about 1-2 cigarettes per day--08/01/18  Substance Use Topics  . Alcohol use: Yes    Alcohol/week: 0.0 standard drinks    Comment: 07/04/2018 "only on special occasions; maybe once/yr"  . Drug use: Yes    Types: Marijuana    Comment: 02/25/2013 "quit marijuana ~ 2001"     Allergies   Patient has no known allergies.   Review of Systems Review of Systems  Unable to perform ROS: Acuity of condition     Physical Exam Updated Vital Signs BP (!) 216/120   Pulse (!) 116   Resp (!) 40   Ht 1.689 m (5' 6.5")   Wt 93.9 kg   LMP 10/20/2015   SpO2 92%   BMI 32.91 kg/m   Physical Exam Vitals signs and nursing note reviewed.  Constitutional:      Appearance: She is well-developed.     Comments: Morbidly obese, tachypnea noted, acute respiratory distress  HENT:     Head: Normocephalic and atraumatic.  Eyes:     Pupils: Pupils are equal, round, and reactive to light.  Neck:     Musculoskeletal: Neck supple.     Vascular: JVD present.  Cardiovascular:     Rate and Rhythm: Regular rhythm.     Heart sounds: Normal heart sounds.     Comments: Tachycardia Pulmonary:     Effort: Tachypnea and accessory muscle usage present. No respiratory distress.     Breath sounds: Rales present. No wheezing.     Comments: Speaking in very short sentences, Rales and crackles in all lung fields Abdominal:     General: Bowel sounds are normal.     Palpations: Abdomen is soft.  Musculoskeletal:     Right lower leg: Edema present.     Left lower leg: Edema present.     Comments: Trace to 1+ bilateral lower extremity edema  Skin:    General: Skin is warm and dry.  Neurological:     Mental Status: She is alert.     Comments: Waxing and waning mental status  Psychiatric:        Mood and Affect: Mood is anxious.      ED Treatments / Results   Labs (all labs ordered are listed, but only abnormal results are displayed) Labs Reviewed  CBC WITH DIFFERENTIAL/PLATELET - Abnormal; Notable for the following components:      Result Value   WBC 19.8 (*)    Platelets 414 (*)    All other components within normal limits  POCT I-STAT 7, (LYTES, BLD GAS, ICA,H+H) - Abnormal; Notable for the following components:   pH, Arterial 7.186 (*)    pCO2 arterial 59.4 (*)    pO2, Arterial 53.0 (*)    Acid-base deficit 7.0 (*)    All other components within normal limits  CULTURE, BLOOD (ROUTINE X 2)  CULTURE, BLOOD (ROUTINE X 2)  SARS CORONAVIRUS 2 (HOSPITAL ORDER, [facility])  BRAIN NATRIURETIC PEPTIDE  TROPONIN I  LACTIC ACID, PLASMA  LACTIC ACID, PLASMA  LACTIC ACID, PLASMA  COMPREHENSIVE METABOLIC PANEL  URINALYSIS, ROUTINE W REFLEX MICROSCOPIC  D-DIMER, QUANTITATIVE (NOT AT [facility])  PROCALCITONIN  LACTATE DEHYDROGENASE  FERRITIN  TRIGLYCERIDES  FIBRINOGEN  C-REACTIVE PROTEIN  I-STAT BETA HCG BLOOD, ED ([facility], [facility], [facility] ONLY)  I-STAT ARTERIAL BLOOD GAS, ED    EKG EKG Interpretation  Date/Time:  Wednesday Dec 18 2018 03:03:30 EDT Ventricular Rate:  114 PR Interval:    QRS Duration: 99 QT Interval:  360 QTC Calculation: 496 R Axis:   55 Text Interpretation:  Sinus tachycardia Probable left ventricular hypertrophy Borderline T abnormalities, lateral leads Borderline prolonged QT interval Confirmed by Thayer Jew [phone]) on 12/18/2018 3:50:22 AM   Radiology Dg Chest Portable 1 View  Result Date: 12/18/2018 CLINICAL DATA:  48 y/o F; intubation and OG tube placement for respiratory failure. EXAM: PORTABLE CHEST 1 VIEW COMPARISON:  07/03/2018 chest radiograph. FINDINGS: Cardiomegaly. Diffuse reticular and patchy opacities of the lungs. No pleural effusion or pneumothorax. Endotracheal tube tip projects 2.1 cm above the carina. Enteric tube tip extends below the field of view into the abdomen. Bones are  unremarkable. IMPRESSION: 1. Endotracheal tube tip projects 2.1 cm above the carina. Enteric tube tip extends below the field of view into the abdomen. 2. Diffuse reticular and patchy opacities of the lungs, likely interstitial and alveolar pulmonary edema. Electronically Signed   By: Kristine Garbe M.D.   On: 12/18/2018 03:52    Procedures Procedure Name: Intubation Date/Time: 12/18/2018 4:01 AM Performed by: Merryl Hacker, MD Pre-anesthesia Checklist: Patient identified, Patient being monitored, Emergency Drugs available, Timeout performed and Suction available Oxygen Delivery Method: Nasal cannula Preoxygenation: Pre-oxygenation with 100% oxygen Induction Type: Rapid sequence Ventilation: Mask ventilation without difficulty Laryngoscope Size: Glidescope and 4 Tube size: 7.5 mm Number of attempts: 1 Placement Confirmation: ETT inserted through vocal cords under direct vision,  CO2 detector and Breath sounds checked- equal and bilateral Secured at: 23 cm Tube secured with: ETT holder Dental Injury: Teeth and Oropharynx as per pre-operative assessment       (including critical care time)  CRITICAL CARE Performed by: Merryl Hacker   Total critical care time: 61 minutes  Critical care time was exclusive of separately billable procedures and treating other patients.  Critical care was necessary to treat or prevent imminent or life-threatening deterioration.  Critical care was time spent personally by me on the following activities: development of treatment plan with patient and/or surrogate as well as nursing, discussions with consultants, evaluation of patient's response to treatment, examination of patient, obtaining history from patient or surrogate, ordering and performing treatments and interventions, ordering and review of laboratory studies, ordering and review of radiographic studies, pulse oximetry and re-evaluation of patient's condition.   Medications  Ordered in ED Medications  ondansetron (ZOFRAN) 4 MG/2ML injection (has no administration in time range)  nitroGLYCERIN 50 mg in dextrose 5 % 250 mL (0.2 mg/mL) infusion (35 mcg/min Intravenous New Bag/Given 12/18/18 0353)  propofol (DIPRIVAN) 1000 MG/100ML infusion (25 mcg/kg/min  93.9 kg Intravenous New Bag/Given 12/18/18 0354)  ceFEPIme (MAXIPIME) 2 g in sodium chloride 0.9 % 100 mL IVPB (2 g Intravenous New Bag/Given 12/18/18 0357)  metroNIDAZOLE (FLAGYL) IVPB 500 mg (has no administration in time range)  vancomycin (VANCOCIN) 2,000 mg in sodium chloride 0.9 % 500 mL IVPB (has no administration in time range)  0.9 %  sodium chloride infusion (500 mLs Intravenous New Bag/Given 12/18/18 0356)  etomidate (AMIDATE) injection (20 mg Intravenous Given 12/18/18 0319)  rocuronium (ZEMURON) injection (100 mg Intravenous Given 12/18/18 0319)     Initial Impression / Assessment and Plan / ED Course  I have reviewed the triage vital signs and the nursing notes.  Pertinent labs & imaging results that were available during my care of the patient were reviewed by me and considered in my medical decision making (see chart for details).        Patient presents in acute respiratory distress.  Clinical picture is  most consistent with pulmonary edema.  However, pneumonia, sepsis, COVID are all considerations.  Unfortunately, patient began to vomit after initially being put on BiPAP.  She was placed on a nonrebreather and continued to vomit.  Decision was made to intubate for airway control.  She is extremely hypertensive.  Nitroglycerin IV was ordered.  Chest x-ray reviewed and shows patchy infiltrates bilaterally with edema.  She appears to be in hypoxic respiratory failure based on ABG.  Other lab work-up is pending.  Patient was discussed with critical care who will come and evaluate the patient.  Patient's lab work reviewed.  Persistently hypoxic.  Patient was given 40 mg of IV Lasix.  She appears to be  Lasix nave based on chart review.  Amy Chaney was evaluated in Emergency Department on 12/18/2018 for the symptoms described in the history of present illness. She was evaluated in the context of the global COVID-19 pandemic, which necessitated consideration that the patient might be at risk for infection with the SARS-CoV-2 virus that causes COVID-19. Institutional protocols and algorithms that pertain to the evaluation of patients at risk for COVID-19 are in a state of rapid change based on information released by regulatory bodies including the CDC and federal and state organizations. These policies and algorithms were followed during the patient's care in the ED.   Final Clinical Impressions(s) / ED Diagnoses   Final diagnoses:  Flash pulmonary edema ([place])  Acute respiratory failure with hypoxia [facility])    ED Discharge Orders    None       Danyle Boening, Barbette Hair, MD 12/18/18 0405    Merryl Hacker, MD 12/18/18 0271    Merryl Hacker, MD 12/18/18 [phone]

## 2018-12-18 NOTE — ED Notes (Signed)
Date and time results received: 12/18/18 ..4:15 AM (use smartphrase ".now" to insert current time)  Test: lactic acid Critical Value: 5.4  Name of Provider Notified: Thayer Jew, MD  Orders Received? Or Actions Taken?:

## 2018-12-18 NOTE — ED Triage Notes (Signed)
Pt arrived via [facility] for sudden onset shortness of breath.

## 2018-12-18 NOTE — Progress Notes (Signed)
PCCM:  Patient seen by PCCM colleague overnight. Also, well known to cardiology services here. I will consult cardiology and let them know she has been admitted in the ICU. We appreciate there input.  CCM will be available throughout day  BP much better.  Start tube feeds  Please see full consult note/H&P by Dr. Claudie Leach.  Garner Nash, DO  Pulmonary Critical Care 12/18/2018 10:59 AM  Personal pager: [phone] If unanswered, please page CCM On-call: [phone]

## 2018-12-18 NOTE — Plan of Care (Signed)
  Problem: Clinical Measurements: Goal: Cardiovascular complication will be avoided Outcome: Progressing   Problem: Nutrition: Goal: Adequate nutrition will be maintained Outcome: Progressing   Problem: Elimination: Goal: Will not experience complications related to urinary retention Outcome: Progressing   Problem: Safety: Goal: Ability to remain free from injury will improve Outcome: Progressing   Problem: Cardiac: Goal: Ability to achieve and maintain adequate cardiopulmonary perfusion will improve Outcome: Progressing

## 2018-12-18 NOTE — Progress Notes (Signed)
Pharmacy Antibiotic Note  Amy Chaney is a 48 y.o. female admitted on 12/18/2018 with pneumonia.  Pharmacy has been consulted for zosyn dosing. SCr 1.17 (BL <1) CrCl ~68 ml/min  Plan: Zosyn 3.375g IV every 8 hours Monitor renal function, clinical progression and LOT  Height: 5' 6.5" (168.9 cm) Weight: 207 lb (93.9 kg) IBW/kg (Calculated) : 60.45  Temp (24hrs), Avg:97.8 F (36.6 C), Min:97.8 F (36.6 C), Max:97.8 F (36.6 C)  Recent Labs  Lab 12/18/18 0318 12/18/18 0328  WBC 19.8*  --   CREATININE 1.17*  --   LATICACIDVEN  --  5.4*    Estimated Creatinine Clearance: 67.9 mL/min (A) (by C-G formula based on SCr of 1.17 mg/dL (H)).    No Known Allergies  Antimicrobials this admission: Zosyn 5/20>> Cefepime x1 vanc x1 Flagyl x1  Dose adjustments this admission: n/a  Microbiology results: 5/20 BCx: sent 5/20 COVID: neg  Bertis Ruddy, PharmD Clinical Pharmacist Please check AMION for all [place] numbers 12/18/2018 4:55 AM

## 2018-12-18 NOTE — Progress Notes (Signed)
  Echocardiogram 2D Echocardiogram has been performed.  Amy Chaney 12/18/2018, 11:41 AM

## 2018-12-18 NOTE — Progress Notes (Signed)
Patient transferred to 3J09 w/o complications. Uneventful trip. report given to unit RRT.

## 2018-12-18 NOTE — ED Notes (Signed)
Attempted to call report

## 2018-12-18 NOTE — Progress Notes (Addendum)
Initial Nutrition Assessment  RD working remotely.  DOCUMENTATION CODES:   Obesity unspecified  INTERVENTION:   Tube feeding: - Vital High Protein @ 20 ml/hr (480 ml/day) - Pro-stat 60 ml TID  Tube feeding regimen provides 1080 kcal, 132 grams of protein, and 401 ml of H2O.   Tube feeding regimen and current propofol provides 1452 total kcal (110% of needs).  NUTRITION DIAGNOSIS:   Inadequate oral intake related to inability to eat as evidenced by NPO status.  GOAL:   Provide needs based on ASPEN/SCCM guidelines  MONITOR:   Vent status, TF tolerance, Labs, Weight trends  REASON FOR ASSESSMENT:   Ventilator, Consult Assessment of nutrition requirement/status, Enteral/tube feeding initiation and management  ASSESSMENT:   48 year old female who presented to the ED on 5/20 with sudden onset SOB. PMH of CAD, CHF, HTN, T2DM, STEMI in 06/2018. Pt was intubated in the ED for airway control. Pt admitted with acute respiratory failure secondary to acute on chronic systolic heart failure.  OGT in place per x-ray.  Reviewed weight history in chart. Pt with a 12.2 kg weight loss since 08/21/18. This is an 11.5% weight loss which is significant for timeframe. If accurate, pt is at risk for malnutrition given significant weight loss. Will complete NFPE at follow-up to further evaluate.  RD received consult for initiation and management of TF. Adult ICU TF Protocol ordered by MD. Will adjust TF regimen to better meet pt's needs.  Discussed pt with RN.  Patient is currently intubated on ventilator support MV: 11.8 L/min Temp (24hrs), Avg:97.7 F (36.5 C), Min:97.6 F (36.4 C), Max:97.8 F (36.6 C)  Propofol: 14.1 ml/hr (provides 372 kcal daily from lipid) NS: 10 ml/hr  Medications reviewed and include: SSI q 4 hours, Lasix 60 mg q 12 hours, Levemir 10 units daily, IV abx  Labs reviewed: CO2 18 (L), creatinine 1.17 (H) CBG: 349  NUTRITION - FOCUSED PHYSICAL EXAM:  Unable  to complete at this time. RD working remotely.  Diet Order:   Diet Order            Diet NPO time specified Except for: Sips with Meds  Diet effective now              EDUCATION NEEDS:   No education needs have been identified at this time  Skin:  Skin Assessment: Reviewed RN Assessment (no documentation)  Last BM:  no documented BM  Height:   Ht Readings from Last 1 Encounters:  12/18/18 5' 6.5" (1.689 m)    Weight:   Wt Readings from Last 1 Encounters:  12/18/18 93.9 kg    Ideal Body Weight:  60.2 kg  BMI:  Body mass index is 32.91 kg/m.  Estimated Nutritional Needs:   Kcal:  9758-8325  Protein:  120-135 grams  Fluid:  per MD    Gaynell Face, MS, RD, LDN Inpatient Clinical Dietitian Pager: [phone] Weekend/After Hours: [phone]

## 2018-12-18 NOTE — Consult Note (Addendum)
Cardiology Consultation:    Patient ID: Amy Chaney MRN: [id number]; DOB: 09-07-1970  Admit date: 12/18/2018 Date of Consult: 12/18/2018  Primary Care Provider: [name], MD Primary Cardiologist: Glenetta Hew, MD  Primary Electrophysiologist:  None    Patient Profile:   Amy Chaney is a 48 y.o. female with a hx of CAD with prior stents to LAD X3, Diag and RCA, ischemic CM EF 35-40% and G2DD, HTN, DM, HLD, tobacco use, morbid obesity and noncompliance who is being seen today for the evaluation of CHF at the request of Dr. Valeta Harms.  History of Present Illness:   [name] has a hx of CAD with prior stents to LAD, Diag and RCA, ICM with EF 35-40% and G2DD 06/2018.  Her last cath 06/2018 for STEMI Ant wall, with culprit lesion mLAD with distal stent edge 100% thrombosis and treated with PCI including overlapping DES stent., also with chronic occlusion of previously jailed 2nd diag branch.    Other LAD stents were patent, non obstructed RCA disease and 2nd marg.   She was doing fairly well as outpt on ASA, lipitor 80, coreg 3.125, lisinopril 10, Brilinta and insulin  By report, the patient 's son died last week and she has been non-compliant with her medications. Has continued to smoke.   At 0300 this morning presented to ER by ambulance with acute SOB. She denied chest pain in ER.  She denied any fever or colds or cough.  Immediatly placed on BiPAP  But then vomiting frothy sputum.  Noted to have lower ext edema.  For airway control she was intubated.  She was  Hypoxic and IV lasix given.    BP on arrival 220/151 with drop over treatment and hours to 78 systolic   EKG:  The EKG was personally reviewed and demonstrates:  ST at 114 with mild lateral T wave depression, LVH but much improved from EKG 07/2018.   Telemetry:  Telemetry was personally reviewed and demonstrates:  NSR 70-80  PCXR ET tube projects 2.1 cm above the carina. Diffuse reticular and patchy opacities of the  lungs, likely interstitial and alveolar pulmonary edema.  Troponin 0.03; <0.03 BNP 803 CRP 1.1  Lact acid 5.4; 1.9 Na 137, k+ 3.9 ionized Ca+ 1.21 Hgb 12.9 WBC 19.8 Plts414 DDimer 2.06 Fibrinogen 530 SARS COVID 2 negative. Blood cultures Pending  Now in 2h ICU on vent 60% 02 and 10 PEEP and NTG drip. BP 107/87 and resp 33.  HR in 60s. On IV propofol.  rec'd cefepime, flagyl, vanc and 2 gm IV mag.  NTG now off wit BP drop earlier.  Last ABG at 0400 7.261/51/70/22.9   Past Medical History:  Diagnosis Date  . Coronary artery disease involving native coronary artery of native heart with angina pectoris ([place]) 01/2013   a. s/p PCI of the mid RCA 01/2013 //  b. LHC 9/14: EF 55%, RCA stent 100% occluded, LAD irregularities >>  subacute stent thrombosis, Promus DES PCI distal overlap // c) 09/2017 - Ant STEMI - LAD-D2 PCI - Successful PCI of LAD (3 overlapping DES), unable to restore flow down D2l that was stented as well.  Likely related to downstream dissection, but unable to rewire.  . Daily headache   . Depression   . Dyslipidemia, goal LDL below 70   . History of Doppler ultrasound    carotid bruit >> a. Carotid US 6/17: bilat ICA 1-39%  . Hypertension   . STEMI involving left anterior descending coronary artery ([place])  09/2017; 06/2018   a) Severe [place] 1, 1, 1 LAD-D2 lesion (complicated by post PTCA dissection/intramural hematoma) -successful extensive PCI of the LAD but unable to maintain patency of the stented D2. b) distal mLAD stent Edge 100% thrombosis --> PTCA & Overlapping DES PCI (Synergy 3 x 12 --> 3.6-3.3 mm). D2 occluded. RCA stents patent, 65 % OM2. EF 30-35%.  Marland Kitchen STEMI involving oth coronary artery of inferior wall ([place]) 03/2013   Secondary to subacute stent thrombosis of RCA stent segment having missed 4 doses of Effient.  . Tobacco abuse 01/11/2016  . Type II diabetes mellitus ([place]) 12/2010   sister and son also diabetic     Past Surgical History:  Procedure Laterality  Date  . CHOLECYSTECTOMY  ~ 2000  . CORONARY STENT INTERVENTION N/A 10/10/2017   Procedure: CORONARY STENT INTERVENTION;  Surgeon: Leonie Man, MD;  Location: [facility];  Service: Cardiovascular; LAD PCI: STENT SYNERGY DES 3.5X32 (p),STENT SYNERGY DES 3.5X 8 (m), STENT SYNERGY DES 3.5X28 (d).  D2 PCI: STENT SYNERGY DES 2.5X24.  (UNABL TO REWIRE & EXPAND - TO OF DIAG AT END OF CASE)  . CORONARY STENT INTERVENTION N/A 07/03/2018   Procedure: CORONARY STENT INTERVENTION;  Surgeon: Leonie Man, MD;  Location: [facility];; PTCA & overlapping DES PCI (overlaps prior stents distally) - Synergy DES 3 x 12 (3.6 mm @ overlap -- 3.3 mm distally)>  . LEFT HEART CATH AND CORONARY ANGIOGRAPHY N/A 10/10/2017   Procedure: LEFT HEART CATH AND CORONARY ANGIOGRAPHY;  Surgeon: Leonie Man, MD;  Location: [facility];  Service: Cardiovascular;  Laterality: N/A; -patent RCA stents with mild in-stent restenosis. Med[place] 1,1,1 mLD-D2 81% (complicated by dissection/intramural thrombus)  . LEFT HEART CATH AND CORONARY ANGIOGRAPHY N/A 07/03/2018   Procedure: LEFT HEART CATH AND CORONARY ANGIOGRAPHY;  Surgeon: Leonie Man, MD;  Location: [facility];; 100% thrombotic occlusion distal LAD stent -- DES PCI. continued 100% D2 (previously lost). patent RCA stents, 65% OM2.  EF 30-35%.  Marland Kitchen LEFT HEART CATHETERIZATION WITH CORONARY ANGIOGRAM N/A 02/25/2013   Procedure: LEFT HEART CATHETERIZATION WITH CORONARY ANGIOGRAM;  Surgeon: Laverda Page, MD;  Location: [facility] CATH LAB;  Service: Cardiovascular;; CTO m RCA; otherwise normal coronaries  . LEFT HEART CATHETERIZATION WITH CORONARY ANGIOGRAM N/A 04/01/2013   Procedure: LEFT HEART CATHETERIZATION WITH CORONARY ANGIOGRAM;  Surgeon: Laverda Page, MD;  Location: [facility] CATH LAB;  Service: Cardiovascular: 100% occlusion of distal RCA stent (subacute stent thrombosis -  secondary to stopping Effient).  Overlapping DES PCI  . NM MYOVIEW LTD  12/2015     EF 38%.  Hypertensive response to exercise (231/132 mmHg).  INTERMEDIATE RISK due to -diffuse hypokinesis and reduced EF.  No ischemia or infarction.  Marland Kitchen PERCUTANEOUS CORONARY STENT INTERVENTION (PCI-S)  04/01/2013   Procedure: PERCUTANEOUS CORONARY STENT INTERVENTION (PCI-S);  Surgeon: Laverda Page, MD;  Location: [facility] CATH LAB;  Service: Cardiovascular;; PCI of distal RCA stent subacute thrombosis: Promus Premier DES 3.0 mm x 20 mm.  Marland Kitchen PERCUTANEOUS CORONARY STENT INTERVENTION (PCI-S)  02/25/2013   Procedure: PERCUTANEOUS CORONARY STENT INTERVENTION (PCI-S);  Surgeon: Laverda Page, MD;  Location: [facility] CATH LAB;  RCA CTO PCI with overlapping Promus DES: 3.0 mm x 38 mm, 3.5 mm x 13m  . TRANSTHORACIC ECHOCARDIOGRAM  12/2015   mild LVH, EF 55-60%, no RWMA, Gr 1 DD, mild MR  . TRANSTHORACIC ECHOCARDIOGRAM  09/2017   In setting of anterior STEMI:  EF  35% with mild concentric hypertrophy.  GRII DD.  Anteroseptal, anterior and anterolateral walls hypokinetic.  Moderate MR.  . TRANSTHORACIC ECHOCARDIOGRAM  07/04/2018   recurrent Anterior STEMI: EF 35-40%.  Apical hypokinesis.  GRII DD.  No thrombus noted.  (Improved EF from 30% up to 35-40% from previous echo)  . TUBAL LIGATION  1994     Home Medications:  Prior to Admission medications   Medication Sig Start Date End Date Taking? Authorizing Provider  aspirin EC 81 MG tablet Take 1 tablet (81 mg total) by mouth daily. 07/06/18  Yes Bhagat, Bhavinkumar, PA  atorvastatin (LIPITOR) 80 MG tablet Take 1 tablet (80 mg total) by mouth daily. 11/21/18  Yes Amy Rakes, MD  blood glucose meter kit and supplies Dispense based on patient and insurance preference. Use up to four times daily as directed. (FOR ICD-10 E10.9, E11.9). 07/06/18  Yes Bhagat, Bhavinkumar, PA  Blood Glucose Monitoring Suppl (TRUE METRIX METER) DEVI 1 kit by Does not apply route 4 (four) times daily. 10/24/17  Yes Ena Dawley, Tiffany S, PA-C  carvedilol (COREG) 3.125 MG tablet Take 1  tablet (3.125 mg total) by mouth 2 (two) times daily. 11/21/18  Yes Amy Rakes, MD  glipiZIDE (GLUCOTROL) 10 MG tablet Take 1 tablet (10 mg total) by mouth 2 (two) times daily before a meal. 11/21/18  Yes Newlin, Enobong, MD  glucose blood (TRUE METRIX BLOOD GLUCOSE TEST) test strip Use as instructed 10/24/17  Yes Ena Dawley, Tiffany S, PA-C  Insulin Glargine (LANTUS SOLOSTAR) 100 UNIT/ML Solostar Pen Inject 30 Units into the skin daily. 11/21/18  Yes Amy Rakes, MD  lisinopril (ZESTRIL) 10 MG tablet Take 1 tablet (10 mg total) by mouth daily. 11/21/18  Yes Amy Rakes, MD  nitroGLYCERIN (NITROSTAT) 0.4 MG SL tablet Place 1 tablet (0.4 mg total) under the tongue every 5 (five) minutes as needed for chest pain. Reported on 11/25/2015 07/06/18  Yes Bhagat, Bhavinkumar, PA  ticagrelor (BRILINTA) 90 MG TABS tablet Take 1 tablet (90 mg total) by mouth 2 (two) times daily. 07/05/18  Yes Goodrich, Callie E, PA-C  TRUEPLUS LANCETS 28G MISC 28 g by Does not apply route 4 (four) times daily. 07/06/18  Yes Rai, Ripudeep Raliegh Ip, MD    Inpatient Medications: Scheduled Meds: . aspirin  81 mg Oral Daily  . atorvastatin  80 mg Oral Daily  . chlorhexidine gluconate (MEDLINE KIT)  15 mL Mouth Rinse BID  . enoxaparin (LOVENOX) injection  40 mg Subcutaneous Q24H  . feeding supplement (PRO-STAT SUGAR FREE 64)  60 mL Per Tube TID  . feeding supplement (VITAL HIGH PROTEIN)  1,000 mL Per Tube Q24H  . furosemide      . furosemide  80 mg Intravenous BID  . insulin aspart  0-15 Units Subcutaneous Q4H  . insulin detemir  10 Units Subcutaneous Daily  . mouth rinse  15 mL Mouth Rinse 10 times per day  . ondansetron      . ticagrelor  90 mg Oral BID   Continuous Infusions: . sodium chloride Stopped (12/18/18 0951)  . nitroGLYCERIN 0 mcg/min (12/18/18 0513)  . propofol (DIPRIVAN) infusion 25 mcg/kg/min (12/18/18 1019)  . vancomycin 2,000 mg (12/18/18 1000)   PRN Meds: sodium chloride, acetaminophen, ondansetron (ZOFRAN)  IV  Allergies:   No Known Allergies  Social History:   Social History   Socioeconomic History  . Marital status: Single    Spouse name: Not on file  . Number of children: Not on file  . Years of education: Not  on file  . Highest education level: Not on file  Occupational History  . Not on file  Social Needs  . Financial resource strain: Not on file  . Food insecurity:    Worry: Not on file    Inability: Not on file  . Transportation needs:    Medical: Not on file    Non-medical: Not on file  Tobacco Use  . Smoking status: Current Every Day Smoker    Packs/day: 0.00    Years: 22.00    Pack years: 0.00    Types: Cigarettes  . Smokeless tobacco: Never Used  . Tobacco comment: pt states she smokes about 1-2 cigarettes per day--08/01/18  Substance and Sexual Activity  . Alcohol use: Yes    Alcohol/week: 0.0 standard drinks    Comment: 07/04/2018 "only on special occasions; maybe once/yr"  . Drug use: Yes    Types: Marijuana    Comment: 02/25/2013 "quit marijuana ~ 2001"  . Sexual activity: Not Currently    Birth control/protection: Surgical  Lifestyle  . Physical activity:    Days per week: Not on file    Minutes per session: Not on file  . Stress: Not on file  Relationships  . Social connections:    Talks on phone: Not on file    Gets together: Not on file    Attends religious service: Not on file    Active member of club or organization: Not on file    Attends meetings of clubs or organizations: Not on file    Relationship status: Not on file  . Intimate partner violence:    Fear of current or ex partner: Not on file    Emotionally abused: Not on file    Physically abused: Not on file    Forced sexual activity: Not on file  Other Topics Concern  . Not on file  Social History Narrative  . Not on file    Family History:    Family History  Problem Relation Age of Onset  . Diabetes Mother   . Diabetes Sister   . Diabetes Sister   . Diabetes Son        type  1  . Diabetes Son        type 2     ROS: pt sedated on vent.  Unable to obtain,    All other ROS reviewed and negative.     Physical Exam/Data:   Vitals:   12/18/18 0700 12/18/18 0725 12/18/18 0742 12/18/18 0800  BP: 133/84     Pulse: 76   74  Resp: (!) 26   (!) 26  Temp:   97.6 F (36.4 C)   TempSrc:   Oral   SpO2: 100% 100%  99%  Weight:      Height:        Intake/Output Summary (Last 24 hours) at 12/18/2018 1111 Last data filed at 12/18/2018 1000 Gross per 24 hour  Intake 637.96 ml  Output -  Net 637.96 ml   Last 3 Weights 12/18/2018 11/07/2018 08/21/2018  Weight (lbs) 207 lb 207 lb 234 lb  Weight (kg) 93.895 kg 93.895 kg 106.142 kg     Body mass index is 32.91 kg/m.      Relevant CV Studies:  ECHO today pending.  Echo 07/04/18  Study Conclusions  - Left ventricle: The cavity size was normal. There was mild   concentric hypertrophy. Systolic function was moderately reduced.   The estimated ejection fraction was in the range of 35%  to 40%.   Hypokinesis of all of the apical segments. Features are   consistent with a pseudonormal left ventricular filling pattern,   with concomitant abnormal relaxation and increased filling   pressure (grade 2 diastolic dysfunction). Doppler parameters are   consistent with elevated ventricular end-diastolic filling   pressure. - Mitral valve: There was mild regurgitation. - Left atrium: The atrium was mildly dilated. - Right ventricle: Systolic function was normal. - Tricuspid valve: There was trivial regurgitation. - Pulmonary arteries: Systolic pressure was within the normal   range. - Inferior vena cava: The vessel was normal in size. - Pericardium, extracardiac: There was no pericardial effusion.   There was a left pleural effusion.  Impressions:  - Since the last study on 10/11/2017 LVEF has improved from 30% to   35-40% with residual hypokinesis in the apical segments. There is   no thrombus on the Definity  ecchocontrast images.  Cardiac cath and PCI 07/03/18  Previously placed Prox LAD to Mid LAD drug eluting stents are widely patent up until the distal edge..  Mid LAD lesion is 100% stenosed at the distal edge of the most distal stent.  A drug-eluting stent was successfully placed using a STENT SYNERGY DES 3X12.  Post intervention, there is a 0% residual stenosis.  Balloon angioplasty was performed in the apical vessel just to help move distal embolization -allowing for flow down to the inferoapex.Colon Flattery 2nd Diag lesion is 100% stenosed. Known occluded stent from previous PCI  Prox RCA to Mid RCA stents are 5% stenosed.  Mid RCA to Dist RCA lesion is 40% stenosed.  Ost 2nd Mrg to 2nd Mrg lesion is 65% stenosed.  There is moderate to severe left ventricular systolic dysfunction.  LV end diastolic pressure is normal.  The left ventricular ejection fraction is 25-35% by visual estimate.  There is trivial (1+) mitral regurgitation.  There is no aortic valve stenosis.  Post intervention, there is a 0% residual stenosis.  Mid LAD lesion is 100% stenosed.   SUMMARY:  Distal stent edge 100% thrombosis (in the mid LAD) as culprit for anterior STEMI: Treated with PTCA and overlapping DES stent (synergy 3.0 mm x 12 mm postdilated to 3.6 mm in the overlap and 3.3 mm distally.  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  (EF roughly 30 to 35%)  RECOMMENDATION:  Admit to CVICU for ongoing care.  Will run Aggrastat for 3 hours post PCI.  Long-term dual endplate therapy.  Currently aspirin and Brilinta, had been on Effient, but did not afford.  Would be okay to use Effient, Brilinta or Plavix as long she will be on it.  Uninterrupted for 1 year, would be okay to stop Plavix for 3 months if on Effient or Brilinta, at 6 months if on Plavix.  Restart home  medications including carvedilol, lisinopril, atorvastatin  Sliding scale insulin for now.  Check A1c.  Check 2D echo to better assess EF.  Would not anticipate fast track discharge based on anterior MI with reduced EF.  Case management or social work consultation to help ensure that she is able to get her medications and not miss doses.  Needs lots of education. Diagnostic  Dominance: Right    Intervention      Laboratory Data:  Chemistry Recent Labs  Lab 12/18/18 0318 12/18/18 0344 12/18/18 0447  NA 138 139 137  K 3.6  3.7 3.9  CL 103  --   --   CO2 18*  --   --   GLUCOSE 332*  --   --   BUN 16  --   --   CREATININE 1.17*  --   --   CALCIUM 9.1  --   --   GFRNONAA 55*  --   --   GFRAA >60  --   --   ANIONGAP 17*  --   --     Recent Labs  Lab 12/18/18 0318  PROT 8.2*  ALBUMIN 3.4*  AST 44*  ALT 27  ALKPHOS 54  BILITOT 0.7   Hematology Recent Labs  Lab 12/18/18 0318 12/18/18 0344 12/18/18 0447  WBC 19.8*  --   --   RBC 4.71  --   --   HGB 12.6 12.9 12.9  HCT 41.0 38.0 38.0  MCV 87.0  --   --   MCH 26.8  --   --   MCHC 30.7  --   --   RDW 14.8  --   --   PLT 414*  --   --    Cardiac Enzymes Recent Labs  Lab 12/18/18 0318 12/18/18 0741  TROPONINI 0.03* <0.03   No results for input(s): TROPIPOC in the last 168 hours.  BNP Recent Labs  Lab 12/18/18 0318  BNP 803.2*    DDimer  Recent Labs  Lab 12/18/18 0318  DDIMER 2.06*    Radiology/Studies:  Ct Head Wo Contrast  Result Date: 12/18/2018 CLINICAL DATA:  Encephalopathy EXAM: CT HEAD WITHOUT CONTRAST TECHNIQUE: Contiguous axial images were obtained from the base of the skull through the vertex without intravenous contrast. COMPARISON:  09/10/2006 FINDINGS: Brain: No evidence of acute infarction, hemorrhage, hydrocephalus, extra-axial collection or mass lesion/mass effect. The cerebellar tonsils project 1 cm below the foramen magnum and are somewhat pointed on sagittal reformats. Question  mild patchy low-density in the cerebral white matter, which would likely be early microvascular ischemia in this patient with multiple vascular risk factors. Vascular: No hyperdense vessel or unexpected calcification. Skull: Negative Sinuses/Orbits: Negative IMPRESSION: 1. No emergent finding. 2. Chiari 1 malformation. Electronically Signed   By: Monte Fantasia M.D.   On: 12/18/2018 06:55   Dg Chest Portable 1 View  Result Date: 12/18/2018 CLINICAL DATA:  48 y/o F; intubation and OG tube placement for respiratory failure. EXAM: PORTABLE CHEST 1 VIEW COMPARISON:  07/03/2018 chest radiograph. FINDINGS: Cardiomegaly. Diffuse reticular and patchy opacities of the lungs. No pleural effusion or pneumothorax. Endotracheal tube tip projects 2.1 cm above the carina. Enteric tube tip extends below the field of view into the abdomen. Bones are unremarkable. IMPRESSION: 1. Endotracheal tube tip projects 2.1 cm above the carina. Enteric tube tip extends below the field of view into the abdomen. 2. Diffuse reticular and patchy opacities of the lungs, likely interstitial and alveolar pulmonary edema. Electronically Signed   By: Kristine Garbe M.D.   On: 12/18/2018 03:52     Assessment and Plan:   1. Acute respiratory failure, acute SOB at home.   - Due to acute pulmonary edema in setting of hypertensive crisis. - Now intubated on vent per CCM.   - will need aggressive diuresis and BP control   2. Acute on chronic systolic HF - last EF 16-10% due to iCM - will repeat echo - diurese with echo 80 IV bid - restart home meds.  3. Hypertensive crisis - due to noncompliance - restart home meds. ACE on  hold due to hyperkalemia. When K improved. Will switch lisinopril to losartan for eventual switch to Entresto  - hydralazine scheduled and PRN  4. CAD s/p anterior STEMI in 06/2018   - She is on ASA and Brilinta and giving per feeding tube.  Troponin neg but will check serial troponin  Doubt ACS -  Continue DAPT and statin  5. DM2 - per primary team  6. Tobacco use - will need cessation counseling  CRITICAL CARE Performed by: Glori Bickers  Total critical care time: 45 minutes  Critical care time was exclusive of separately billable procedures and treating other patients.  Critical care was necessary to treat or prevent imminent or life-threatening deterioration.  Critical care was time spent personally by me (independent of midlevel providers or residents) on the following activities: development of treatment plan with patient and/or surrogate as well as nursing, discussions with consultants, evaluation of patient's response to treatment, examination of patient, obtaining history from patient or surrogate, ordering and performing treatments and interventions, ordering and review of laboratory studies, ordering and review of radiographic studies, pulse oximetry and re-evaluation of patient's condition.       For questions or updates, please contact [place] Please consult [email or web address] for contact info under     Signed, Cecilie Kicks, NP  12/18/2018 11:11 AM

## 2018-12-19 ENCOUNTER — Inpatient Hospital Stay (HOSPITAL_COMMUNITY): Payer: Self-pay

## 2018-12-19 DIAGNOSIS — I10 Essential (primary) hypertension: Secondary | ICD-10-CM

## 2018-12-19 DIAGNOSIS — Z9911 Dependence on respirator [ventilator] status: Secondary | ICD-10-CM

## 2018-12-19 DIAGNOSIS — I25119 Atherosclerotic heart disease of native coronary artery with unspecified angina pectoris: Secondary | ICD-10-CM

## 2018-12-19 LAB — CBC
HCT: 36.1 % (ref 36.0–46.0)
Hemoglobin: 11.7 g/dL — ABNORMAL LOW (ref 12.0–15.0)
MCH: 26.4 pg (ref 26.0–34.0)
MCHC: 32.4 g/dL (ref 30.0–36.0)
MCV: 81.5 fL (ref 80.0–100.0)
Platelets: 362 10*3/uL (ref 150–400)
RBC: 4.43 MIL/uL (ref 3.87–5.11)
RDW: 14.7 % (ref 11.5–15.5)
WBC: 17.1 10*3/uL — ABNORMAL HIGH (ref 4.0–10.5)
nRBC: 0 % (ref 0.0–0.2)

## 2018-12-19 LAB — GLUCOSE, CAPILLARY
Glucose-Capillary: 106 mg/dL — ABNORMAL HIGH (ref 70–99)
Glucose-Capillary: 128 mg/dL — ABNORMAL HIGH (ref 70–99)
Glucose-Capillary: 158 mg/dL — ABNORMAL HIGH (ref 70–99)
Glucose-Capillary: 184 mg/dL — ABNORMAL HIGH (ref 70–99)
Glucose-Capillary: 227 mg/dL — ABNORMAL HIGH (ref 70–99)
Glucose-Capillary: 242 mg/dL — ABNORMAL HIGH (ref 70–99)

## 2018-12-19 LAB — URINE CULTURE: Culture: NO GROWTH

## 2018-12-19 LAB — BASIC METABOLIC PANEL
Anion gap: 13 (ref 5–15)
BUN: 42 mg/dL — ABNORMAL HIGH (ref 6–20)
CO2: 23 mmol/L (ref 22–32)
Calcium: 9.4 mg/dL (ref 8.9–10.3)
Chloride: 101 mmol/L (ref 98–111)
Creatinine, Ser: 1.23 mg/dL — ABNORMAL HIGH (ref 0.44–1.00)
GFR calc Af Amer: 60 mL/min — ABNORMAL LOW (ref >60)
GFR calc non Af Amer: 51 mL/min — ABNORMAL LOW (ref >60)
Glucose, Bld: 192 mg/dL — ABNORMAL HIGH (ref 70–99)
Potassium: 3.3 mmol/L — ABNORMAL LOW (ref 3.5–5.1)
Sodium: 137 mmol/L (ref 135–145)

## 2018-12-19 LAB — PHOSPHORUS
Phosphorus: 4.5 mg/dL (ref 2.5–4.6)
Phosphorus: 4.3 mg/dL (ref 2.5–4.6)

## 2018-12-19 LAB — TROPONIN I
Troponin I: <0.03 ng/mL (ref <0.03)
Troponin I: 0.03 ng/mL (ref <0.03)

## 2018-12-19 LAB — HIV ANTIBODY (ROUTINE TESTING W REFLEX): HIV Screen 4th Generation wRfx: NONREACTIVE

## 2018-12-19 LAB — MAGNESIUM
Magnesium: 2.1 mg/dL (ref 1.7–2.4)
Magnesium: 2.4 mg/dL (ref 1.7–2.4)

## 2018-12-19 MED ORDER — PHENOL 1.4 % MT LIQD: 1.0000 | OROMUCOSAL | Status: DC | PRN

## 2018-12-19 MED ORDER — POTASSIUM CHLORIDE 20 MEQ/15ML (10%) PO SOLN
20.0000 meq | ORAL | Status: DC
  Administered 2018-12-19: 08:00:00 20 meq
  Filled 2018-12-19: qty 15

## 2018-12-19 MED ORDER — CARVEDILOL 3.125 MG PO TABS
3.1250 mg | ORAL_TABLET | Freq: Two times a day (BID) | ORAL | Status: DC
  Administered 2018-12-19 – 2018-12-22 (×5): 3.125 mg via ORAL
  Filled 2018-12-19 (×6): qty 1

## 2018-12-19 MED ORDER — SACUBITRIL-VALSARTAN 24-26 MG PO TABS
1.0000 | ORAL_TABLET | Freq: Two times a day (BID) | ORAL | Status: DC
  Administered 2018-12-19 – 2018-12-22 (×7): 1 via ORAL
  Filled 2018-12-19 (×8): qty 1

## 2018-12-19 MED ORDER — POTASSIUM CHLORIDE 20 MEQ/15ML (10%) PO SOLN
40.0000 meq | ORAL | Status: AC
  Administered 2018-12-19: 11:00:00 40 meq
  Filled 2018-12-19: qty 30

## 2018-12-19 MED ORDER — METOLAZONE 2.5 MG PO TABS
2.5000 mg | ORAL_TABLET | Freq: Once | ORAL | Status: AC
  Administered 2018-12-19: 09:00:00 2.5 mg via ORAL
  Filled 2018-12-19: qty 1

## 2018-12-19 MED ORDER — FAMOTIDINE 20 MG PO TABS
20.0000 mg | ORAL_TABLET | Freq: Every day | ORAL | Status: DC
  Administered 2018-12-19 – 2018-12-21 (×3): 20 mg via ORAL
  Filled 2018-12-19 (×3): qty 1

## 2018-12-19 NOTE — Progress Notes (Signed)
NAME:  Amy Chaney, MRN:  [id number], DOB:  28-Apr-1971, LOS: 1 ADMISSION DATE:  12/18/2018, CONSULTATION DATE:  5/20 REFERRING MD:  ED, CHIEF COMPLAINT:  SOB    Brief History   48 yo FM, h/o CAD, ICM, Chronic systolic heart failure EF 35%. STEMI 06/2018, HTN and noncompliance with home meds. Admitted for decompensated failure, htn crisis, SBP 240s. Patient was intubated for pulmonary and AHRF.   History of present illness   48 yo female with hx of CAD, isquemic CMP , chronic systolic CHF EF 64%, STEMI in 12/19, HTN and hx of noncompliance with medications presenting with one day history of worsening shortness of breath. Arrived in the ED with sats in the 70's , SBP ~240, unable to communicate , placed briefly on BIPAP and started having frothy sputum and vomiting then she was emergently intubated.  The history is gathered my review of medical chart and information obtained by the ED staff.  She is currently intubated on FIO2 of 100% peep of 10 , sedated and on nitro drip.  12 point ROS done and negative , pertinent positives are mentioned in the HPI. This is limited due to lack of history. But per the ED staff her sister was updated and said that the patients son died last week.   Past Medical History   Past Medical History:  Diagnosis Date  . Coronary artery disease involving native coronary artery of native heart with angina pectoris ([place]) 01/2013   a. s/p PCI of the mid RCA 01/2013 //  b. LHC 9/14: EF 55%, RCA stent 100% occluded, LAD irregularities >>  subacute stent thrombosis, Promus DES PCI distal overlap // c) 09/2017 - Ant STEMI - LAD-D2 PCI - Successful PCI of LAD (3 overlapping DES), unable to restore flow down D2l that was stented as well.  Likely related to downstream dissection, but unable to rewire.  . Daily headache   . Depression   . Dyslipidemia, goal LDL below 70   . History of Doppler ultrasound    carotid bruit >> a. Carotid US 6/17: bilat ICA 1-39%  . Hypertension    . STEMI involving left anterior descending coronary artery ([place]) 09/2017; 06/2018   a) Severe Medina 1, 1, 1 LAD-D2 lesion (complicated by post PTCA dissection/intramural hematoma) -successful extensive PCI of the LAD but unable to maintain patency of the stented D2. b) distal mLAD stent Edge 100% thrombosis --> PTCA & Overlapping DES PCI (Synergy 3 x 12 --> 3.6-3.3 mm). D2 occluded. RCA stents patent, 65 % OM2. EF 30-35%.  Marland Kitchen STEMI involving oth coronary artery of inferior wall ([place]) 03/2013   Secondary to subacute stent thrombosis of RCA stent segment having missed 4 doses of Effient.  . Tobacco abuse 01/11/2016  . Type II diabetes mellitus ([place]) 12/2010   sister and son also diabetic      [facility] Events   5/20 - intubated  Consults:  Heart Failure Service   Procedures:  5/20 ETT  Significant Diagnostic Tests:    Micro Data:  5/20: COVID Negative  Antimicrobials:  None    Interim history/subjective:  Failed SBT this AM. Frothy sputum production and more tachyapenia. CXR improved with less pulmonary edema. The patient's images have been independently reviewed by me.    Objective   Blood pressure 125/82, pulse 92, temperature 98.9 F (37.2 C), temperature source Axillary, resp. rate 19, height 5' 6.5" (1.689 m), weight 103.2 kg, last menstrual period 10/20/2015, SpO2 100 %.  Vent Mode: PSV;CPAP FiO2 (%):  [40 %-60 %] 40 % Set Rate:  [33 bmp] 33 bmp Vt Set:  [360 mL-390 mL] 390 mL PEEP:  [5 cmH20-10 cmH20] 8 cmH20 Plateau Pressure:  [18 cmH20-19 cmH20] 18 cmH20   Intake/Output Summary (Last 24 hours) at 12/19/2018 1013 Last data filed at 12/19/2018 0900 Gross per 24 hour  Intake 1499.97 ml  Output 5175 ml  Net -3675.03 ml   Filed Weights   12/18/18 0304 12/19/18 0439  Weight: 93.9 kg 103.2 kg    Examination: General: middle aged fm, intubated sedated on vent, appears comfortable on life support  HENT: NCAT, sclera clear, tracking  Lungs: Bilateral  vented breaths Cardiovascular: RRR, s1 s2, faint systolic murmur  Abdomen: obese, soft, nt nd  Extremities: trace BL LE edema  Neuro: alert, following commands, no deficit  GU: foley   Resolved Hospital Problem list     Assessment & Plan:   Acute hypoxemic respiratory failure requiring intubation and mechanical ventilation  - full vent support  - failed SBT this AM - can retry weaning this afternoon - given additional lasix  - would prefer optimized cardiac function and volume status before removing vent and loosing the after load reduction  - may need to consider BIPAP post extubation  - sedation with propofol and fent  - abx held  - reassess in the afternoon and consider liberation from vent   Acute on Chronic Systolic heart failure with pulmonary edema  H/o CAD and h/o STEMI  - continue lasix increased today  - additional dose of metolazone  - coreg continued  - off nitro ggt  - continue ticagrelor   Hypertensive Emergency, resolved  - see HF and BP regimen above   Lactic Acidosis, secondary to above, resolved  - continue to observe   DMII, hyperglycemia - CBGs with SSI  - levemir 10U   Nutrition  - TF  - appreciate dietary recs    Best practice:  Diet: TF Pain/Anxiety/Delirium protocol (if indicated): yes VAP protocol (if indicated): yes DVT prophylaxis: lovenox GI prophylaxis: famotidine  Glucose control: SSI  Mobility: BR Code Status: FULL Family Communication:  Disposition: ICU level care   Labs   CBC: Recent Labs  Lab 12/18/18 0318 12/18/18 0344 12/18/18 0447 12/18/18 0927 12/19/18 0525  WBC 19.8*  --   --   --  17.1*  NEUTROABS 10.0*  --   --   --   --   HGB 12.6 12.9 12.9 11.6* 11.7*  HCT 41.0 38.0 38.0 34.0* 36.1  MCV 87.0  --   --   --  81.5  PLT 414*  --   --   --  353    Basic Metabolic Panel: Recent Labs  Lab 12/18/18 0318 12/18/18 0344 12/18/18 0447 12/18/18 0927 12/18/18 1241 12/18/18 1710 12/19/18 0525  NA 138 139  137 135  --   --  137  K 3.6 3.7 3.9 5.8*  --   --  3.3*  CL 103  --   --   --   --   --  101  CO2 18*  --   --   --   --   --  23  GLUCOSE 332*  --   --   --   --   --  192*  BUN 16  --   --   --   --   --  42*  CREATININE 1.17*  --   --   --   --   --  1.23*  CALCIUM 9.1  --   --   --   --   --  9.4  MG  --   --   --   --  2.4 2.1 2.1  PHOS  --   --   --   --  3.5 3.8 4.5   GFR: Estimated Creatinine Clearance: 67.8 mL/min (A) (by C-G formula based on SCr of 1.23 mg/dL (H)). Recent Labs  Lab 12/18/18 0318 12/18/18 0328 12/18/18 0736 12/19/18 0525  PROCALCITON <0.10  --   --   --   WBC 19.8*  --   --  17.1*  LATICACIDVEN  --  5.4* 1.9  --     Liver Function Tests: Recent Labs  Lab 12/18/18 0318  AST 44*  ALT 27  ALKPHOS 54  BILITOT 0.7  PROT 8.2*  ALBUMIN 3.4*   No results for input(s): LIPASE, AMYLASE in the last 168 hours. No results for input(s): AMMONIA in the last 168 hours.  ABG    Component Value Date/Time   PHART 7.399 12/18/2018 0927   PCO2ART 37.0 12/18/2018 0927   PO2ART 391.0 (H) 12/18/2018 0927   HCO3 23.0 12/18/2018 0927   TCO2 24 12/18/2018 0927   ACIDBASEDEF 2.0 12/18/2018 0927   O2SAT 100.0 12/18/2018 0927     Coagulation Profile: No results for input(s): INR, PROTIME in the last 168 hours.  Cardiac Enzymes: Recent Labs  Lab 12/18/18 0318 12/18/18 0741 12/18/18 1710 12/19/18 0002 12/19/18 0525  TROPONINI 0.03* <0.03 0.03* <0.03 0.03*    HbA1C: Hgb A1c MFr Bld  Date/Time Value Ref Range Status  07/04/2018 03:12 AM 9.9 (H) 4.8 - 5.6 % Final    Comment:    (NOTE) Pre diabetes:          5.7%-6.4% Diabetes:              >6.4% Glycemic control for   <7.0% adults with diabetes   10/10/2017 04:16 AM 8.6 (H) 4.8 - 5.6 % Final    Comment:    (NOTE) Pre diabetes:          5.7%-6.4% Diabetes:              >6.4% Glycemic control for   <7.0% adults with diabetes     CBG: Recent Labs  Lab 12/18/18 1510 12/18/18 1946 12/19/18  0000 12/19/18 0427 12/19/18 0743  GLUCAP 301* 247* 242* 184* 128*     Past Medical History  She,  has a past medical history of Coronary artery disease involving native coronary artery of native heart with angina pectoris ([place]) (01/2013), Daily headache, Depression, Dyslipidemia, goal LDL below 70, History of Doppler ultrasound, Hypertension, STEMI involving left anterior descending coronary artery ([place]) (09/2017; 06/2018), STEMI involving oth coronary artery of inferior wall ([place]) (03/2013), Tobacco abuse (01/11/2016), and Type II diabetes mellitus ([place]) (12/2010).   Surgical History    Past Surgical History:  Procedure Laterality Date  . CHOLECYSTECTOMY  ~ 2000  . CORONARY STENT INTERVENTION N/A 10/10/2017   Procedure: CORONARY STENT INTERVENTION;  Surgeon: Leonie Man, MD;  Location: [facility];  Service: Cardiovascular; LAD PCI: STENT SYNERGY DES 3.5X32 (p),STENT SYNERGY DES 3.5X 8 (m), STENT SYNERGY DES 3.5X28 (d).  D2 PCI: STENT SYNERGY DES 2.5X24.  (UNABL TO REWIRE & EXPAND - TO OF DIAG AT END OF CASE)  . CORONARY STENT INTERVENTION N/A 07/03/2018   Procedure: CORONARY STENT INTERVENTION;  Surgeon: Leonie Man, MD;  Location: [facility] INVASIVE CV LAB;; PTCA &  overlapping DES PCI (overlaps prior stents distally) - Synergy DES 3 x 12 (3.6 mm @ overlap -- 3.3 mm distally)>  . LEFT HEART CATH AND CORONARY ANGIOGRAPHY N/A 10/10/2017   Procedure: LEFT HEART CATH AND CORONARY ANGIOGRAPHY;  Surgeon: Leonie Man, MD;  Location: [facility];  Service: Cardiovascular;  Laterality: N/A; -patent RCA stents with mild in-stent restenosis. [place] 1,1,1 mLD-D2 54% (complicated by dissection/intramural thrombus)  . LEFT HEART CATH AND CORONARY ANGIOGRAPHY N/A 07/03/2018   Procedure: LEFT HEART CATH AND CORONARY ANGIOGRAPHY;  Surgeon: Leonie Man, MD;  Location: [facility] LAB;; 100% thrombotic occlusion distal LAD stent -- DES PCI. continued 100% D2 (previously lost). patent  RCA stents, 65% OM2.  EF 30-35%.  Marland Kitchen LEFT HEART CATHETERIZATION WITH CORONARY ANGIOGRAM N/A 02/25/2013   Procedure: LEFT HEART CATHETERIZATION WITH CORONARY ANGIOGRAM;  Surgeon: Laverda Page, MD;  Location: [facility] CATH LAB;  Service: Cardiovascular;; CTO m RCA; otherwise normal coronaries  . LEFT HEART CATHETERIZATION WITH CORONARY ANGIOGRAM N/A 04/01/2013   Procedure: LEFT HEART CATHETERIZATION WITH CORONARY ANGIOGRAM;  Surgeon: Laverda Page, MD;  Location: [facility] CATH LAB;  Service: Cardiovascular: 100% occlusion of distal RCA stent (subacute stent thrombosis -  secondary to stopping Effient).  Overlapping DES PCI  . NM MYOVIEW LTD  12/2015    EF 38%.  Hypertensive response to exercise (231/132 mmHg).  INTERMEDIATE RISK due to -diffuse hypokinesis and reduced EF.  No ischemia or infarction.  Marland Kitchen PERCUTANEOUS CORONARY STENT INTERVENTION (PCI-S)  04/01/2013   Procedure: PERCUTANEOUS CORONARY STENT INTERVENTION (PCI-S);  Surgeon: Laverda Page, MD;  Location: [facility] CATH LAB;  Service: Cardiovascular;; PCI of distal RCA stent subacute thrombosis: Promus Premier DES 3.0 mm x 20 mm.  Marland Kitchen PERCUTANEOUS CORONARY STENT INTERVENTION (PCI-S)  02/25/2013   Procedure: PERCUTANEOUS CORONARY STENT INTERVENTION (PCI-S);  Surgeon: Laverda Page, MD;  Location: [facility] CATH LAB;  RCA CTO PCI with overlapping Promus DES: 3.0 mm x 38 mm, 3.5 mm x 78m  . TRANSTHORACIC ECHOCARDIOGRAM  12/2015   mild LVH, EF 55-60%, no RWMA, Gr 1 DD, mild MR  . TRANSTHORACIC ECHOCARDIOGRAM  09/2017   In setting of anterior STEMI:  EF 35% with mild concentric hypertrophy.  GRII DD.  Anteroseptal, anterior and anterolateral walls hypokinetic.  Moderate MR.  . TRANSTHORACIC ECHOCARDIOGRAM  07/04/2018   recurrent Anterior STEMI: EF 35-40%.  Apical hypokinesis.  GRII DD.  No thrombus noted.  (Improved EF from 30% up to 35-40% from previous echo)  . TUBAL LIGATION  1994     Social History   reports that she has been smoking cigarettes. She has  been smoking about 0.00 packs per day for the past 22.00 years. She has never used smokeless tobacco. She reports current alcohol use. She reports current drug use. Drug: Marijuana.   Family History   Her family history includes Diabetes in her mother, sister, sister, son, and son.   Allergies No Known Allergies   Home Medications  Prior to Admission medications   Medication Sig Start Date End Date Taking? Authorizing Provider  aspirin EC 81 MG tablet Take 1 tablet (81 mg total) by mouth daily. 07/06/18  Yes Bhagat, Bhavinkumar, PA  atorvastatin (LIPITOR) 80 MG tablet Take 1 tablet (80 mg total) by mouth daily. 11/21/18  Yes NCharlott Rakes MD  blood glucose meter kit and supplies Dispense based on patient and insurance preference. Use up to four times daily as directed. (FOR ICD-10 E10.9, E11.9). 07/06/18  Yes Bhagat,  Bhavinkumar, PA  Blood Glucose Monitoring Suppl (TRUE METRIX METER) DEVI 1 kit by Does not apply route 4 (four) times daily. 10/24/17  Yes Ena Dawley, Tiffany S, PA-C  carvedilol (COREG) 3.125 MG tablet Take 1 tablet (3.125 mg total) by mouth 2 (two) times daily. 11/21/18  Yes [name], MD  glipiZIDE (GLUCOTROL) 10 MG tablet Take 1 tablet (10 mg total) by mouth 2 (two) times daily before a meal. 11/21/18  Yes Newlin, Enobong, MD  glucose blood (TRUE METRIX BLOOD GLUCOSE TEST) test strip Use as instructed 10/24/17  Yes Ena Dawley, Tiffany S, PA-C  Insulin Glargine (LANTUS SOLOSTAR) 100 UNIT/ML Solostar Pen Inject 30 Units into the skin daily. 11/21/18  Yes [name], MD  lisinopril (ZESTRIL) 10 MG tablet Take 1 tablet (10 mg total) by mouth daily. 11/21/18  Yes [name], MD  nitroGLYCERIN (NITROSTAT) 0.4 MG SL tablet Place 1 tablet (0.4 mg total) under the tongue every 5 (five) minutes as needed for chest pain. Reported on 11/25/2015 07/06/18  Yes Bhagat, Bhavinkumar, PA  ticagrelor (BRILINTA) 90 MG TABS tablet Take 1 tablet (90 mg total) by mouth 2 (two) times daily. 07/05/18  Yes  Goodrich, Callie E, PA-C  TRUEPLUS LANCETS 28G MISC 28 g by Does not apply route 4 (four) times daily. 07/06/18  Yes Rai, Ripudeep Raliegh Ip, MD     This patient is critically ill with multiple organ system failure; which, requires frequent high complexity decision making, assessment, support, evaluation, and titration of therapies. This was completed through the application of advanced monitoring technologies and extensive interpretation of multiple databases. During this encounter critical care time was devoted to patient care services described in this note for 32 minutes.   Garner Nash, DO [facility] 12/19/2018 10:13 AM  Personal pager: [phone] If unanswered, please page CCM On-call: [phone]

## 2018-12-19 NOTE — Progress Notes (Signed)
RT note- weaned PEEP down to 5, increased secretions, frothy, MD notified, continue to wean at this time with PEEP at 8.

## 2018-12-19 NOTE — Progress Notes (Addendum)
Advanced Heart Failure Rounding Note   Subjective:    Admitted yesterday with diffuse pulmonary edema in setting of med non-compliance and HTN crisis.   Diuresed about 4L overnight. BP much improved. CXR cleared.   Remains on vent. Awake. Following commands.  PEEP weaned 8 -> 5 and developed recurrent edema.    Objective:   Weight Range:  Vital Signs:   Temp:  [97.6 F (36.4 C)-98.9 F (37.2 C)] 98.9 F (37.2 C) (05/21 0739) Pulse Rate:  [60-92] 92 (05/21 0900) Resp:  [19-33] 19 (05/21 0900) BP: (75-189)/(54-105) 125/82 (05/21 0900) SpO2:  [98 %-100 %] 100 % (05/21 0900) FiO2 (%):  [40 %-60 %] 40 % (05/21 0814) Weight:  [103.2 kg] 103.2 kg (05/21 0439) Last BM Date: (UTA- pt intubated and sedated)  Weight change: Filed Weights   12/18/18 0304 12/19/18 0439  Weight: 93.9 kg 103.2 kg    Intake/Output:   Intake/Output Summary (Last 24 hours) at 12/19/2018 1014 Last data filed at 12/19/2018 0900 Gross per 24 hour  Intake 1499.97 ml  Output 5175 ml  Net -3675.03 ml     Physical Exam: General:  Awake on vent. Following commands HEENT: normal Neck: supple. JVP 8-9 . Carotids 2+ bilat; no bruits. No lymphadenopathy or thryomegaly appreciated. Cor: PMI nondisplaced. Regular rate & rhythm. No rubs, gallops or murmurs. Lungs: clear Abdomen: obese soft, nontender, nondistended. No hepatosplenomegaly. No bruits or masses. Good bowel sounds. Extremities: no cyanosis, clubbing, rash, trace edema Neuro: Awake on vent. Following commands  Telemetry: NSR 90s Personally reviewed   Labs: Basic Metabolic Panel: Recent Labs  Lab 12/18/18 0318 12/18/18 0344 12/18/18 0447 12/18/18 0927 12/18/18 1241 12/18/18 1710 12/19/18 0525  NA 138 139 137 135  --   --  137  K 3.6 3.7 3.9 5.8*  --   --  3.3*  CL 103  --   --   --   --   --  101  CO2 18*  --   --   --   --   --  23  GLUCOSE 332*  --   --   --   --   --  192*  BUN 16  --   --   --   --   --  42*  CREATININE  1.17*  --   --   --   --   --  1.23*  CALCIUM 9.1  --   --   --   --   --  9.4  MG  --   --   --   --  2.4 2.1 2.1  PHOS  --   --   --   --  3.5 3.8 4.5    Liver Function Tests: Recent Labs  Lab 12/18/18 0318  AST 44*  ALT 27  ALKPHOS 54  BILITOT 0.7  PROT 8.2*  ALBUMIN 3.4*   No results for input(s): LIPASE, AMYLASE in the last 168 hours. No results for input(s): AMMONIA in the last 168 hours.  CBC: Recent Labs  Lab 12/18/18 0318 12/18/18 0344 12/18/18 0447 12/18/18 0927 12/19/18 0525  WBC 19.8*  --   --   --  17.1*  NEUTROABS 10.0*  --   --   --   --   HGB 12.6 12.9 12.9 11.6* 11.7*  HCT 41.0 38.0 38.0 34.0* 36.1  MCV 87.0  --   --   --  81.5  PLT 414*  --   --   --  362    Cardiac Enzymes: Recent Labs  Lab 12/18/18 0318 12/18/18 0741 12/18/18 1710 12/19/18 0002 12/19/18 0525  TROPONINI 0.03* <0.03 0.03* <0.03 0.03*    BNP: BNP (last 3 results) Recent Labs    12/18/18 0318  BNP 803.2*    ProBNP (last 3 results) No results for input(s): PROBNP in the last 8760 hours.    Other results:  Imaging: Ct Head Wo Contrast  Result Date: 12/18/2018 CLINICAL DATA:  Encephalopathy EXAM: CT HEAD WITHOUT CONTRAST TECHNIQUE: Contiguous axial images were obtained from the base of the skull through the vertex without intravenous contrast. COMPARISON:  09/10/2006 FINDINGS: Brain: No evidence of acute infarction, hemorrhage, hydrocephalus, extra-axial collection or mass lesion/mass effect. The cerebellar tonsils project 1 cm below the foramen magnum and are somewhat pointed on sagittal reformats. Question mild patchy low-density in the cerebral white matter, which would likely be early microvascular ischemia in this patient with multiple vascular risk factors. Vascular: No hyperdense vessel or unexpected calcification. Skull: Negative Sinuses/Orbits: Negative IMPRESSION: 1. No emergent finding. 2. Chiari 1 malformation. Electronically Signed   By: Monte Fantasia M.D.    On: 12/18/2018 06:55   Dg Chest Port 1 View  Result Date: 12/19/2018 CLINICAL DATA:  Pulmonary edema EXAM: PORTABLE CHEST 1 VIEW COMPARISON:  Yesterday FINDINGS: Endotracheal tube tip just below the clavicular heads. The orogastric tube reaches the stomach. Rapid normalization of lung opacity. Normal heart size. Coronary stents are present. No pneumothorax. IMPRESSION: 1. Resolved pulmonary edema. 2. Normal hardware positioning. Electronically Signed   By: Monte Fantasia M.D.   On: 12/19/2018 07:17   Dg Chest Portable 1 View  Result Date: 12/18/2018 CLINICAL DATA:  48 y/o F; intubation and OG tube placement for respiratory failure. EXAM: PORTABLE CHEST 1 VIEW COMPARISON:  07/03/2018 chest radiograph. FINDINGS: Cardiomegaly. Diffuse reticular and patchy opacities of the lungs. No pleural effusion or pneumothorax. Endotracheal tube tip projects 2.1 cm above the carina. Enteric tube tip extends below the field of view into the abdomen. Bones are unremarkable. IMPRESSION: 1. Endotracheal tube tip projects 2.1 cm above the carina. Enteric tube tip extends below the field of view into the abdomen. 2. Diffuse reticular and patchy opacities of the lungs, likely interstitial and alveolar pulmonary edema. Electronically Signed   By: Kristine Garbe M.D.   On: 12/18/2018 03:52      Medications:     Scheduled Medications:  aspirin  81 mg Oral Daily   atorvastatin  80 mg Oral Daily   carvedilol  12.5 mg Oral BID WC   chlorhexidine gluconate (MEDLINE KIT)  15 mL Mouth Rinse BID   enoxaparin (LOVENOX) injection  40 mg Subcutaneous Q24H   feeding supplement (PRO-STAT SUGAR FREE 64)  60 mL Per Tube TID   feeding supplement (VITAL HIGH PROTEIN)  1,000 mL Per Tube Q24H   furosemide  80 mg Intravenous BID   hydrALAZINE  25 mg Oral Q8H   insulin aspart  0-20 Units Subcutaneous Q4H   insulin detemir  10 Units Subcutaneous BID   mouth rinse  15 mL Mouth Rinse 10 times per day   potassium  chloride  40 mEq Per Tube Q4H   ticagrelor  90 mg Oral BID     Infusions:  sodium chloride Stopped (12/18/18 1211)   nitroGLYCERIN 20 mcg/min (12/18/18 1952)   propofol (DIPRIVAN) infusion Stopped (12/19/18 0818)     PRN Medications:  sodium chloride, acetaminophen, hydrALAZINE, ondansetron (ZOFRAN) IV   Assessment/Plan:   1. Acute hypoxic respiratory  failure due to acute pulmonary edema in setting of hypertensive crisis - Due to acute pulmonary edema in setting of hypertensive crisis. - Now intubated on vent per CCM.   - Diuresing very well. CXR cleared (personally viewed). However failed wean of PEEP this am. Will need more diuresis this am and hopefully can extubate later today.  2. Acute on chronic systolic HF - last EF 11-17% due to iCM - echo 12/18/18 EF 25% with LVH. RV ok  - continue to diurese as above.  - was on carvedilol 3.125 at home now on 12.5 bid. Will decrease back to 3.125 bid.  - continue hydralazine, consider switch to Bidil prior to d/c - Hyperkalemia resolved. Start Entresto 24/26  - Add spiro if BP tolerates  3. Hypertensive crisis - due to noncompliance - meds as above - hydralazine IV PRN  4. CAD s/p anterior STEMI in 06/2018   - She is on ASA and Brilinta and giving per feeding tube.  Troponin neg but will check serial troponin  Doubt ACS - Continue DAPT and statin  5. DM2 - per primary team  6. Tobacco use - will need cessation counseling  7. Hypokalemia - will supp. Discussed dosing with PharmD personally.  Length of Stay: 1   CRITICAL CARE Performed by: Glori Bickers  Total critical care time: 35 minutes  Critical care time was exclusive of separately billable procedures and treating other patients.  Critical care was necessary to treat or prevent imminent or life-threatening deterioration.  Critical care was time spent personally by me (independent of midlevel providers or residents) on the following activities:  development of treatment plan with patient and/or surrogate as well as nursing, discussions with consultants, evaluation of patient's response to treatment, examination of patient, obtaining history from patient or surrogate, ordering and performing treatments and interventions, ordering and review of laboratory studies, ordering and review of radiographic studies, pulse oximetry and re-evaluation of patient's condition.     Glori Bickers MD 12/19/2018, 10:14 AM  Advanced Heart Failure Team Pager [phone] (M-F; [place])  Please contact [facility] for night-coverage after hours (4p -7a ) and weekends on [email or web address]

## 2018-12-19 NOTE — Plan of Care (Signed)
  Problem: Clinical Measurements: Goal: Ability to maintain clinical measurements within normal limits will improve Outcome: Progressing Goal: Respiratory complications will improve Outcome: Progressing Goal: Cardiovascular complication will be avoided Outcome: Progressing   Problem: Activity: Goal: Risk for activity intolerance will decrease Outcome: Progressing   Problem: Education: Goal: Ability to verbalize understanding of medication therapies will improve Outcome: Progressing   Problem: Cardiac: Goal: Ability to achieve and maintain adequate cardiopulmonary perfusion will improve Outcome: Progressing   Problem: Activity: Goal: Ability to tolerate increased activity will improve Outcome: Completed/Met   Problem: Respiratory: Goal: Ability to maintain a clear airway and adequate ventilation will improve Outcome: Completed/Met   Problem: Role Relationship: Goal: Method of communication will improve Outcome: Completed/Met

## 2018-12-19 NOTE — Progress Notes (Addendum)
Inpatient Diabetes Program Recommendations  AACE/ADA: New Consensus Statement on Inpatient Glycemic Control (2015)  Target Ranges:  Prepandial:   less than 140 mg/dL      Peak postprandial:   less than 180 mg/dL (1-2 hours)      Critically ill patients:  140 - 180 mg/dL   Results for Amy Chaney, Amy Chaney (MRN [id number]) as of 12/18/2018 09:13  Ref. Range 12/18/2018 07:44  Glucose-Capillary Latest Ref Range: 70 - 99 mg/dL 349 (H) Novolog 11 units given    Review of Glycemic Control  Diabetes history: DM 2 Outpatient Diabetes medications: Lantus 30 units Daily, Glipizide 10 mg BID Current orders for Inpatient glycemic control: Levemir 10 units Daily, Novolog 0-15 units Q4 hours  Inpatient Diabetes Program Recommendations:    Glucose trends increasing as Tube Feed Volume is increased. Please consider    -  d/cing current order set    -  place patient on ICU Glycemic Control order set Phase 1 SQ insulin Novolog Moderate scale 2-6 units Q4 hours.    -  Also consider Adding Novolog 4 units Q4 hours Tube Feed Coverage.  Thanks,  Tama Headings RN, MSN, BC-ADM Inpatient Diabetes Coordinator Team Pager [phone] (8a-5p)

## 2018-12-20 ENCOUNTER — Telehealth (HOSPITAL_COMMUNITY): Payer: Self-pay | Admitting: Licensed Clinical Social Worker

## 2018-12-20 LAB — GLUCOSE, CAPILLARY
Glucose-Capillary: 120 mg/dL — ABNORMAL HIGH (ref 70–99)
Glucose-Capillary: 127 mg/dL — ABNORMAL HIGH (ref 70–99)
Glucose-Capillary: 213 mg/dL — ABNORMAL HIGH (ref 70–99)
Glucose-Capillary: 249 mg/dL — ABNORMAL HIGH (ref 70–99)
Glucose-Capillary: 254 mg/dL — ABNORMAL HIGH (ref 70–99)
Glucose-Capillary: 267 mg/dL — ABNORMAL HIGH (ref 70–99)
Glucose-Capillary: 271 mg/dL — ABNORMAL HIGH (ref 70–99)

## 2018-12-20 LAB — BASIC METABOLIC PANEL
Anion gap: 15 (ref 5–15)
BUN: 48 mg/dL — ABNORMAL HIGH (ref 6–20)
CO2: 26 mmol/L (ref 22–32)
Calcium: 10.4 mg/dL — ABNORMAL HIGH (ref 8.9–10.3)
Chloride: 98 mmol/L (ref 98–111)
Creatinine, Ser: 1.64 mg/dL — ABNORMAL HIGH (ref 0.44–1.00)
GFR calc Af Amer: 42 mL/min — ABNORMAL LOW (ref >60)
GFR calc non Af Amer: 36 mL/min — ABNORMAL LOW (ref >60)
Glucose, Bld: 88 mg/dL (ref 70–99)
Potassium: 3.6 mmol/L (ref 3.5–5.1)
Sodium: 139 mmol/L (ref 135–145)

## 2018-12-20 MED ORDER — GLUCERNA SHAKE PO LIQD
237.0000 mL | Freq: Three times a day (TID) | ORAL | Status: DC
  Administered 2018-12-20 (×2): 237 mL via ORAL

## 2018-12-20 MED ORDER — FUROSEMIDE 40 MG PO TABS
40.0000 mg | ORAL_TABLET | Freq: Every day | ORAL | Status: DC
  Administered 2018-12-21 – 2018-12-22 (×2): 40 mg via ORAL
  Filled 2018-12-20 (×2): qty 1

## 2018-12-20 MED ORDER — SACUBITRIL-VALSARTAN 24-26 MG PO TABS: 1.0000 | ORAL_TABLET | Freq: Two times a day (BID) | ORAL | 11 refills | Status: DC

## 2018-12-20 MED ORDER — FUROSEMIDE 40 MG PO TABS: 40.0000 mg | ORAL_TABLET | Freq: Every day | ORAL | 11 refills | Status: DC

## 2018-12-20 MED ORDER — SPIRONOLACTONE 25 MG PO TABS: 12.5000 mg | ORAL_TABLET | Freq: Every day | ORAL | 11 refills | Status: DC

## 2018-12-20 MED ORDER — SPIRONOLACTONE 12.5 MG HALF TABLET
12.5000 mg | ORAL_TABLET | Freq: Every day | ORAL | Status: DC
  Administered 2018-12-20 – 2018-12-22 (×3): 12.5 mg via ORAL
  Filled 2018-12-20 (×3): qty 1

## 2018-12-20 MED ORDER — CARVEDILOL 3.125 MG PO TABS: 3.1250 mg | ORAL_TABLET | Freq: Two times a day (BID) | ORAL | 11 refills | Status: DC

## 2018-12-20 MED ORDER — ADULT MULTIVITAMIN W/MINERALS CH
1.0000 | ORAL_TABLET | Freq: Every day | ORAL | Status: DC
  Administered 2018-12-20 – 2018-12-22 (×3): 1 via ORAL
  Filled 2018-12-20 (×3): qty 1

## 2018-12-20 MED FILL — FUROSEMIDE 40 MG TABLET: 40 | 30 days supply | Qty: 30 | Fill #0

## 2018-12-20 MED FILL — CARVEDILOL 3.125 MG TABLET: 3.125 | 30 days supply | Qty: 60 | Fill #0

## 2018-12-20 MED FILL — ENTRESTO 24 MG-26 MG TABLET: 24-26 | 30 days supply | Qty: 60 | Fill #0

## 2018-12-20 MED FILL — SPIRONOLACTONE 25 MG TABLET: 25 | 30 days supply | Qty: 30 | Fill #0

## 2018-12-20 NOTE — Telephone Encounter (Signed)
CSW consulted to assist with obtaining pt medications in particular assisting with patient assistance for pt new medication, Entresto.    Pt being provided with 30 day free card for first month but does not have insurance coverage at this time- CSW had application for [employer] Patient Assistance brought to patient room by inpatient social work- CSW called patient and helped her complete application over the phone.  Application picked up by pharmacist and brought down to the clinic for completion and submission.  CSW will continue to follow and assist as needed  Jorge Ny, [facility] Desk#: [phone] Cell#: [phone]

## 2018-12-20 NOTE — Progress Notes (Signed)
Nutrition Follow-up  RD working remotely.  DOCUMENTATION CODES:   Obesity unspecified  INTERVENTION:   - Glucerna Shake po TID, each supplement provides 220 kcal and 10 grams of protein  - MVI with minerals daily  NUTRITION DIAGNOSIS:   Inadequate oral intake related to inability to eat as evidenced by NPO status.  Progressing, pt now on Heart Healthy diet  GOAL:   Patient will meet greater than or equal to 90% of their needs  Progressing  MONITOR:   PO intake, Supplement acceptance, Weight trends, I & O's, Labs  REASON FOR ASSESSMENT:   Ventilator, Consult Assessment of nutrition requirement/status, Enteral/tube feeding initiation and management  ASSESSMENT:   48 year old female who presented to the ED on 5/20 with sudden onset SOB. PMH of CAD, CHF, HTN, T2DM, STEMI in 06/2018. Pt was intubated in the ED for airway control. Pt admitted with acute respiratory failure secondary to acute on chronic systolic heart failure.  5/21 - extubated  Diet advanced to Heart Healthy this morning. Weight down 6 lbs overnight. Pt continues to diurese. Reviewed RN edema assessment. Pt with on-pitting generalized edema.  Spoke with pt via phone call to room. Pt lethargic during phone call.  Pt reports that the only thing she has had by mouth so far today is water. When asked about her appetite, pt states "I could eat a little something." Pt reports that she had a good appetite PTA but "I wasn't following a diet."  Pt shares that she had been trying to lose weight PTA.  RD to order oral nutrition supplement to aid pt in meeting kcal and protein needs. Pt amenable to this plan. Will also order daily MVI.  Medications reviewed and include: Pepcid, Lasix, SSI, Levemir 10 units BID, Aldactone  Labs reviewed: BUN 48 (H), creatinine 1.64 (H) CBG's: 249, 127, 271, 106, 158, 227 x 24 hours  UOP: 3375 ml x 24 hours I/O's: -5.3 L since admit  Diet Order:   Diet Order            Diet  Heart Room service appropriate? Yes; Fluid consistency: Thin  Diet effective now              EDUCATION NEEDS:   No education needs have been identified at this time  Skin:  Skin Assessment: Reviewed RN Assessment  Last BM:  12/18/18  Height:   Ht Readings from Last 1 Encounters:  12/18/18 5' 6.5" (1.689 m)    Weight:   Wt Readings from Last 1 Encounters:  12/20/18 100.5 kg    Ideal Body Weight:  60.2 kg  BMI:  Body mass index is 35.23 kg/m.  Estimated Nutritional Needs:   Kcal:  1700-1900  Protein:  85-100 grams  Fluid:  >/= 1.7 L    Gaynell Face, MS, RD, LDN Inpatient Clinical Dietitian Pager: [phone] Weekend/After Hours: [phone]

## 2018-12-20 NOTE — Progress Notes (Signed)
NAME:  Amy Chaney, MRN:  [id number], DOB:  11-09-1970, LOS: 2 ADMISSION DATE:  12/18/2018, CONSULTATION DATE:  5/20 REFERRING MD:  ED, CHIEF COMPLAINT:  SOB    Brief History   48 yo FM, h/o CAD, ICM, Chronic systolic heart failure EF 35%. STEMI 06/2018, HTN and noncompliance with home meds. Admitted for decompensated failure, htn crisis, SBP 240s. Patient was intubated for pulmonary and AHRF.   Past Medical History   Past Medical History:  Diagnosis Date  . Coronary artery disease involving native coronary artery of native heart with angina pectoris ([place]) 01/2013   a. s/p PCI of the mid RCA 01/2013 //  b. LHC 9/14: EF 55%, RCA stent 100% occluded, LAD irregularities >>  subacute stent thrombosis, Promus DES PCI distal overlap // c) 09/2017 - Ant STEMI - LAD-D2 PCI - Successful PCI of LAD (3 overlapping DES), unable to restore flow down D2l that was stented as well.  Likely related to downstream dissection, but unable to rewire.  . Daily headache   . Depression   . Dyslipidemia, goal LDL below 70   . History of Doppler ultrasound    carotid bruit >> a. Carotid US 6/17: bilat ICA 1-39%  . Hypertension   . STEMI involving left anterior descending coronary artery ([place]) 09/2017; 06/2018   a) Severe [place] 1, 1, 1 LAD-D2 lesion (complicated by post PTCA dissection/intramural hematoma) -successful extensive PCI of the LAD but unable to maintain patency of the stented D2. b) distal mLAD stent Edge 100% thrombosis --> PTCA & Overlapping DES PCI (Synergy 3 x 12 --> 3.6-3.3 mm). D2 occluded. RCA stents patent, 65 % OM2. EF 30-35%.  Marland Kitchen STEMI involving oth coronary artery of inferior wall ([place]) 03/2013   Secondary to subacute stent thrombosis of RCA stent segment having missed 4 doses of Effient.  . Tobacco abuse 01/11/2016  . Type II diabetes mellitus ([place]) 12/2010   sister and son also diabetic      [facility] Events   5/20 - intubated  Consults:  Heart Failure Service   Procedures:   5/20 >> 5/21 ETT  Significant Diagnostic Tests:    Micro Data:  5/20: COVID Negative  Antimicrobials:  None    Interim history/subjective:  Extubated yesterday.  Stable overnight No complaints.  Denies any dyspnea, chest pain  Objective   Blood pressure 100/60, pulse 87, temperature 98.5 F (36.9 C), temperature source Oral, resp. rate 16, height 5' 6.5" (1.689 m), weight 100.5 kg, last menstrual period 10/20/2015, SpO2 98 %.    Vent Mode: PSV;CPAP FiO2 (%):  [40 %] 40 % PEEP:  [8 cmH20] 8 cmH20 Pressure Support:  [8 cmH20] 8 cmH20   Intake/Output Summary (Last 24 hours) at 12/20/2018 0903 Last data filed at 12/20/2018 0800 Gross per 24 hour  Intake 740 ml  Output 2400 ml  Net -1660 ml   Filed Weights   12/18/18 0304 12/19/18 0439 12/20/18 0600  Weight: 93.9 kg 103.2 kg 100.5 kg    Examination: Gen:      No acute distress HEENT:  EOMI, sclera anicteric Neck:     No masses; no thyromegaly Lungs:    Clear to auscultation bilaterally; normal respiratory effort CV:         Regular rate and rhythm; no murmurs Abd:      + bowel sounds; soft, non-tender; no palpable masses, no distension Ext:    No edema; adequate peripheral perfusion Skin:      Warm and dry; no  rash Neuro: alert and oriented x 3 Psych: normal mood and affect  Resolved Hospital Problem list   Hypertensive emergency, lactic acidosis  Assessment & Plan:   Acute hypoxemic respiratory failure requiring intubation and mechanical ventilation  Stable post extubation.  On room air  Acute on Chronic Systolic heart failure with pulmonary edema  H/o CAD and h/o STEMI  Continue Lipitor, Coreg Aldactone and Entresto Diuresis with Lasix  DMII, hyperglycemia CBGs with SSI  Levemir 10U   Stable for transfer out of unit and to [facility] service  Best practice:  Diet: PO diet Pain/Anxiety/Delirium protocol (if indicated): yes VAP protocol (if indicated): NA DVT prophylaxis: lovenox GI prophylaxis: famotidine   Glucose control: SSI  Mobility: BR Code Status: FULL Family Communication:  Disposition: Transfer to med surg  Labs   CBC: Recent Labs  Lab 12/18/18 0318 12/18/18 0344 12/18/18 0447 12/18/18 0927 12/19/18 0525  WBC 19.8*  --   --   --  17.1*  NEUTROABS 10.0*  --   --   --   --   HGB 12.6 12.9 12.9 11.6* 11.7*  HCT 41.0 38.0 38.0 34.0* 36.1  MCV 87.0  --   --   --  81.5  PLT 414*  --   --   --  740    Basic Metabolic Panel: Recent Labs  Lab 12/18/18 0318 12/18/18 0344 12/18/18 0447 12/18/18 0927 12/18/18 1241 12/18/18 1710 12/19/18 0525 12/19/18 1655 12/20/18 0317  NA 138 139 137 135  --   --  137  --  139  K 3.6 3.7 3.9 5.8*  --   --  3.3*  --  3.6  CL 103  --   --   --   --   --  101  --  98  CO2 18*  --   --   --   --   --  23  --  26  GLUCOSE 332*  --   --   --   --   --  192*  --  88  BUN 16  --   --   --   --   --  42*  --  48*  CREATININE 1.17*  --   --   --   --   --  1.23*  --  1.64*  CALCIUM 9.1  --   --   --   --   --  9.4  --  10.4*  MG  --   --   --   --  2.4 2.1 2.1 2.4  --   PHOS  --   --   --   --  3.5 3.8 4.5 4.3  --    GFR: Estimated Creatinine Clearance: 50.1 mL/min (A) (by C-G formula based on SCr of 1.64 mg/dL (H)). Recent Labs  Lab 12/18/18 0318 12/18/18 0328 12/18/18 0736 12/19/18 0525  PROCALCITON <0.10  --   --   --   WBC 19.8*  --   --  17.1*  LATICACIDVEN  --  5.4* 1.9  --     Liver Function Tests: Recent Labs  Lab 12/18/18 0318  AST 44*  ALT 27  ALKPHOS 54  BILITOT 0.7  PROT 8.2*  ALBUMIN 3.4*   No results for input(s): LIPASE, AMYLASE in the last 168 hours. No results for input(s): AMMONIA in the last 168 hours.  ABG    Component Value Date/Time   PHART 7.399 12/18/2018 0927   PCO2ART 37.0 12/18/2018 0927  PO2ART 391.0 (H) 12/18/2018 0927   HCO3 23.0 12/18/2018 0927   TCO2 24 12/18/2018 0927   ACIDBASEDEF 2.0 12/18/2018 0927   O2SAT 100.0 12/18/2018 0927     Coagulation Profile: No results for  input(s): INR, PROTIME in the last 168 hours.  Cardiac Enzymes: Recent Labs  Lab 12/18/18 0318 12/18/18 0741 12/18/18 1710 12/19/18 0002 12/19/18 0525  TROPONINI 0.03* <0.03 0.03* <0.03 0.03*    HbA1C: Hgb A1c MFr Bld  Date/Time Value Ref Range Status  07/04/2018 03:12 AM 9.9 (H) 4.8 - 5.6 % Final    Comment:    (NOTE) Pre diabetes:          5.7%-6.4% Diabetes:              >6.4% Glycemic control for   <7.0% adults with diabetes   10/10/2017 04:16 AM 8.6 (H) 4.8 - 5.6 % Final    Comment:    (NOTE) Pre diabetes:          5.7%-6.4% Diabetes:              >6.4% Glycemic control for   <7.0% adults with diabetes     CBG: Recent Labs  Lab 12/19/18 1553 12/19/18 2000 12/20/18 0009 12/20/18 0424 12/20/18 0741  GLUCAP 158* 106* 271* 127* 249*   Idella Lamontagne MD [facility] 12/20/2018, 9:04 AM

## 2018-12-20 NOTE — Care Management (Signed)
CM acknowledges consult written for discharge med assistance.  Pt is active with [facility] and per notes - pt has been requested to provide necessary documentation to receive medication assistance.  HF fund will provide discharge medications via [facility].

## 2018-12-21 ENCOUNTER — Other Ambulatory Visit: Payer: Self-pay

## 2018-12-21 DIAGNOSIS — J9601 Acute respiratory failure with hypoxia: Secondary | ICD-10-CM

## 2018-12-21 DIAGNOSIS — J810 Acute pulmonary edema: Secondary | ICD-10-CM

## 2018-12-21 LAB — BASIC METABOLIC PANEL
Anion gap: 16 — ABNORMAL HIGH (ref 5–15)
BUN: 60 mg/dL — ABNORMAL HIGH (ref 6–20)
CO2: 25 mmol/L (ref 22–32)
Calcium: 10.4 mg/dL — ABNORMAL HIGH (ref 8.9–10.3)
Chloride: 94 mmol/L — ABNORMAL LOW (ref 98–111)
Creatinine, Ser: 2.1 mg/dL — ABNORMAL HIGH (ref 0.44–1.00)
GFR calc Af Amer: 31 mL/min — ABNORMAL LOW (ref >60)
GFR calc non Af Amer: 27 mL/min — ABNORMAL LOW (ref >60)
Glucose, Bld: 184 mg/dL — ABNORMAL HIGH (ref 70–99)
Potassium: 3.2 mmol/L — ABNORMAL LOW (ref 3.5–5.1)
Sodium: 135 mmol/L (ref 135–145)

## 2018-12-21 LAB — BLOOD GAS, ARTERIAL
Acid-Base Excess: 3.5 mmol/L — ABNORMAL HIGH (ref 0.0–2.0)
Bicarbonate: 26.8 mmol/L (ref 20.0–28.0)
Drawn by: 55062
O2 Content: 2 L/min
O2 Saturation: 98.5 %
Patient temperature: 97.7
pCO2 arterial: 35.4 mmHg (ref 32.0–48.0)
pH, Arterial: 7.489 — ABNORMAL HIGH (ref 7.350–7.450)
pO2, Arterial: 120 mmHg — ABNORMAL HIGH (ref 83.0–108.0)

## 2018-12-21 LAB — TROPONIN I: Troponin I: 0.03 ng/mL (ref <0.03)

## 2018-12-21 LAB — CBC
HCT: 39.9 % (ref 36.0–46.0)
Hemoglobin: 13 g/dL (ref 12.0–15.0)
MCH: 26.2 pg (ref 26.0–34.0)
MCHC: 32.6 g/dL (ref 30.0–36.0)
MCV: 80.3 fL (ref 80.0–100.0)
Platelets: 418 10*3/uL — ABNORMAL HIGH (ref 150–400)
RBC: 4.97 MIL/uL (ref 3.87–5.11)
RDW: 14.7 % (ref 11.5–15.5)
WBC: 13.7 10*3/uL — ABNORMAL HIGH (ref 4.0–10.5)
nRBC: 0 % (ref 0.0–0.2)

## 2018-12-21 LAB — GLUCOSE, CAPILLARY
Glucose-Capillary: 114 mg/dL — ABNORMAL HIGH (ref 70–99)
Glucose-Capillary: 224 mg/dL — ABNORMAL HIGH (ref 70–99)
Glucose-Capillary: 231 mg/dL — ABNORMAL HIGH (ref 70–99)
Glucose-Capillary: 240 mg/dL — ABNORMAL HIGH (ref 70–99)
Glucose-Capillary: 286 mg/dL — ABNORMAL HIGH (ref 70–99)
Glucose-Capillary: 344 mg/dL — ABNORMAL HIGH (ref 70–99)

## 2018-12-21 LAB — PHOSPHORUS: Phosphorus: 5.2 mg/dL — ABNORMAL HIGH (ref 2.5–4.6)

## 2018-12-21 LAB — HEMOGLOBIN A1C
Hgb A1c MFr Bld: 8.2 % — ABNORMAL HIGH (ref 4.8–5.6)
Mean Plasma Glucose: 188.64 mg/dL

## 2018-12-21 LAB — MAGNESIUM: Magnesium: 2 mg/dL (ref 1.7–2.4)

## 2018-12-21 MED ORDER — POTASSIUM CHLORIDE CRYS ER 20 MEQ PO TBCR
40.0000 meq | EXTENDED_RELEASE_TABLET | Freq: Once | ORAL | Status: AC
  Administered 2018-12-21: 40 meq via ORAL
  Filled 2018-12-21: qty 2

## 2018-12-21 MED ORDER — INSULIN ASPART 100 UNIT/ML ~~LOC~~ SOLN
0.0000 [IU] | Freq: Three times a day (TID) | SUBCUTANEOUS | Status: DC
  Administered 2018-12-21: 16:00:00 7 [IU] via SUBCUTANEOUS
  Administered 2018-12-22: 5 [IU] via SUBCUTANEOUS

## 2018-12-21 MED ORDER — SODIUM CHLORIDE 0.9 % IV BOLUS
250.0000 mL | Freq: Once | INTRAVENOUS | Status: AC
  Administered 2018-12-21: 250 mL via INTRAVENOUS

## 2018-12-21 MED ORDER — INSULIN ASPART 100 UNIT/ML ~~LOC~~ SOLN
0.0000 [IU] | Freq: Every day | SUBCUTANEOUS | Status: DC
  Administered 2018-12-21: 2 [IU] via SUBCUTANEOUS

## 2018-12-21 MED ORDER — SODIUM CHLORIDE 0.9 % IV BOLUS: 250.0000 mL | Freq: Once | INTRAVENOUS | Status: DC

## 2018-12-21 MED ORDER — SODIUM CHLORIDE 0.9 % IV BOLUS
250.0000 mL | Freq: Once | INTRAVENOUS | Status: AC | PRN
  Administered 2018-12-21: 01:00:00 250 mL via INTRAVENOUS

## 2018-12-21 NOTE — Progress Notes (Signed)
Pt was sitting on the toilet trying to have a bowel movement when she became unresponsive. Pt still had a pulse and was still breathing but not able to respond to staff. Staff transferred pt from bed side commode to the bed where pt became responsive again. Pt was able to state her name and where she was. Vitals were stable, blood sugar was 224. Rapid paged. MD paged by rapid.   BP started to drop to 81/70 at 0026 to 70s/50s at 0035. 211ml Bolus given. BP only came up to 81/70. Another 253mL was given. BP came up to 101/71. Will continue to monitor pt.

## 2018-12-21 NOTE — Progress Notes (Addendum)
Called to bedside to assess patient regarding hypotension and AMS.  RN reports pt was up having a BM when she became altered and check of vitals showed hypotension and weak pulse.  Pt recovered her mental status slowly. Was put back in bed by staff.   On arrival to room, patient lying in bed in NAD.  Awake, alert / oriented.  Speaking clearly / full sentences.  Skin warm/dry. No edema on exam. 5/21 CXR images reviewed with no evidence of volume overload / edema.  Suspect patient had vasovagal episode while on bedside commode.  500 ml bolus given per ELink MD.  BP improved to 121 systolic.      Plan: RN may repeat 250 ml NS bolus x1 if BP < 90 systolic but asked she call PCCM Continue to monitor BP closely No indication for ICU transfer at this time May need BP regimen / diuretics re-evaluated in am pending BP review Add troponin to prior labs Assess EKG   Amy Gens, NP-C [facility] Pgr: [phone] or if no answer [phone] 12/21/2018, 2:21 AM

## 2018-12-21 NOTE — Progress Notes (Signed)
[facility]                                                                              Patient Demographics  Amy Chaney, is a 48 y.o. female, DOB - [birth year], WRU:[id number]  Admit date - 12/18/2018   Admitting Physician Roxanne Mins, MD  Outpatient Primary MD for the patient is [name], MD  Outpatient specialists:   LOS - 3  days   Medical records reviewed and are as summarized below:    Chief Complaint  Patient presents with  . Shortness of Breath       Brief summary   48 yo FM, h/o CAD, ICM, Chronic systolic heart failure EF 35%. STEMI 06/2018, HTN and noncompliance with home meds. Admitted for decompensated failure, htn crisis, SBP 240s. Patient was intubated for pulmonary edema and AHRF.  Patient was extubated on 5/22, transferred to the floor.  TR[facility] assumed service on 5/23   Assessment & Plan    Principal Problem:   Acute respiratory failure with hypoxia and hypercapnia (HCC) -Likely precipitated due to acute pulmonary edema, acute on chronic systolic CHF and hypertensive crisis -Patient was intubated for respiratory support.  She was extubated on 5/22. -Currently stable, O2 sats 99% on 2 L O2, wean off as tolerated  Active Problems:   Acute on chronic combined systolic and diastolic CHF, NYHA class 3 ([place]) with acute pulmonary edema -Patient was placed on IV Lasix for diuresis, was intubated at the time of admission -Cardiology following, strict I's and O's and daily weights, negative balance of 5.9 L. -Overnight was hypotensive with rapid response with systolic BP in 91Y, received 500 cc bolus - creatinine trending up, check orthostatic vitals -Lasix transitioned to oral 40 mg daily -CHF team following. -2D echo showed EF of 20 to 25%, diffuse hypokinesis, normal right ventricular systolic function (EF 35 to 40% in 06/2018)  Hypokalemia -Replaced  Syncope overnight -Hypotensive with SBP in 80s, had a rapid response, patient  was given 500 cc bolus IV fluid. - will check orthostatic vitals, Lasix transition to 40 mg oral daily, has received dose this morning. -PT OT evaluation    Diabetes type 2, uncontrolled ([place]) with complications, CAD -With hyperglycemia, CBGs in 200s yesterday, patient is no longer on tube feeds -Continue Levemir 10 units twice daily, discontinued every 4 hours sliding scale -Placed on sensitive sliding scale insulin, will reassess CBGs, may need NovoLog meal coverage  History of coronary artery disease status post STEMI in 06/2018  -Cardiology following, continue aspirin, Brilinta, statin    Tobacco abuse -Counseled on tobacco cessation   Code Status: *Full CODE STATUS DVT Prophylaxis:  Lovenox  Family Communication: Discussed in detail with the patient, all imaging results, lab results explained to the patient    Disposition Plan: When cleared by cardiology and creatinine improving, likely in next 24 hours  Time Spent in minutes 35 minutes  Procedures:  2D echo  Consultants:   PCCM Cardiology  Antimicrobials:   Anti-infectives (From admission, onward)   Start     Dose/Rate Route Frequency Ordered Stop   12/18/18 1200  piperacillin-tazobactam (ZOSYN) IVPB 3.375 g  Status:  Discontinued     3.375 g 12.5 mL/hr over 240 Minutes Intravenous Every 8 hours 12/18/18 0456 12/18/18 0521   12/18/18 0830  vancomycin (VANCOCIN) 2,000 mg in sodium chloride 0.9 % 500 mL IVPB     2,000 mg 250 mL/hr over 120 Minutes Intravenous  Once 12/18/18 0820 12/18/18 1200   12/18/18 0345  vancomycin (VANCOCIN) 2,000 mg in sodium chloride 0.9 % 500 mL IVPB  Status:  Discontinued     2,000 mg 250 mL/hr over 120 Minutes Intravenous  Once 12/18/18 0330 12/18/18 1152   12/18/18 0330  ceFEPIme (MAXIPIME) 2 g in sodium chloride 0.9 % 100 mL IVPB     2 g 200 mL/hr over 30 Minutes Intravenous  Once 12/18/18 0325 12/18/18 0427   12/18/18 0330  metroNIDAZOLE (FLAGYL) IVPB 500 mg     500 mg 100 mL/hr  over 60 Minutes Intravenous  Once 12/18/18 0325 12/18/18 0620   12/18/18 0330  vancomycin (VANCOCIN) IVPB 1000 mg/200 mL premix  Status:  Discontinued     1,000 mg 200 mL/hr over 60 Minutes Intravenous  Once 12/18/18 0325 12/18/18 0329          Medications  Scheduled Meds: . aspirin  81 mg Oral Daily  . atorvastatin  80 mg Oral Daily  . carvedilol  3.125 mg Oral BID WC  . enoxaparin (LOVENOX) injection  40 mg Subcutaneous Q24H  . famotidine  20 mg Oral QHS  . feeding supplement (GLUCERNA SHAKE)  237 mL Oral TID BM  . furosemide  40 mg Oral Daily  . insulin aspart  0-20 Units Subcutaneous Q4H  . insulin detemir  10 Units Subcutaneous BID  . multivitamin with minerals  1 tablet Oral Daily  . sacubitril-valsartan  1 tablet Oral BID  . spironolactone  12.5 mg Oral Daily  . ticagrelor  90 mg Oral BID   Continuous Infusions: . sodium chloride Stopped (12/18/18 1211)  . nitroGLYCERIN 20 mcg/min (12/18/18 1952)  . sodium chloride     PRN Meds:.sodium chloride, acetaminophen, hydrALAZINE, ondansetron (ZOFRAN) IV, phenol      Subjective:   Amy Chaney was seen and examined today.  Overnight events noted with vasovagal syncope, hypotensive.  This morning feeling better, BP more stable.  Denies any dizziness or lightheadedness. Patient denies chest pain, shortness of breath, abdominal pain, N/V/D/C, new weakness, numbess, tingling. No fevers.  Objective:   Vitals:   12/21/18 0316 12/21/18 0432 12/21/18 0500 12/21/18 0602  BP: 115/76 119/72  137/69  Pulse: 80   82  Resp: 16   16  Temp: 98.1 F (36.7 C)   98.3 F (36.8 C)  TempSrc: Oral   Oral  SpO2: 100%   99%  Weight:   102.3 kg   Height:        Intake/Output Summary (Last 24 hours) at 12/21/2018 1055 Last data filed at 12/21/2018 0442 Gross per 24 hour  Intake 480 ml  Output 1050 ml  Net -570 ml     Wt Readings from Last 3 Encounters:  12/21/18 102.3 kg  11/07/18 93.9 kg  08/21/18 106.1 kg     Exam   General: Sleepy but easily arousable and oriented, NAD, following commands  Eyes:   HEENT:  Atraumatic, normocephalic, normal oropharynx  Cardiovascular: S1 S2 auscultated, Regular rate and rhythm.  Respiratory: Clear to auscultation bilaterally, no wheezing, rales or rhonchi  Gastrointestinal: Soft, nontender, nondistended, + bowel sounds  Ext: Trace pedal edema bilaterally  Neuro: No new deficits  Musculoskeletal: No digital cyanosis, clubbing  Skin: No rashes  Psych: Sleepy but easily arousable and oriented   Data Reviewed:  I have personally reviewed following labs and imaging studies  Micro Results Recent Results (from the past 240 hour(s))  Blood culture (routine x 2)     Status: None (Preliminary result)   Collection Time: 12/18/18  3:18 AM  Result Value Ref Range Status   Specimen Description BLOOD LEFT HAND  Final   Special Requests   Final    BOTTLES DRAWN AEROBIC AND ANAEROBIC Blood Culture adequate volume   Culture   Final    NO GROWTH 2 DAYS Performed at [facility], [place] [address] [place], [place] [address]    Report Status PENDING  Incomplete  Blood culture (routine x 2)     Status: None (Preliminary result)   Collection Time: 12/18/18  3:34 AM  Result Value Ref Range Status   Specimen Description BLOOD RIGHT HAND  Final   Special Requests   Final    BOTTLES DRAWN AEROBIC ONLY Blood Culture adequate volume   Culture   Final    NO GROWTH 2 DAYS Performed at [facility], [place] [address] [place], [place] [address]    Report Status PENDING  Incomplete  SARS Coronavirus 2 [facility] order, Performed in [place] hospital lab)     Status: None   Collection Time: 12/18/18  3:40 AM  Result Value Ref Range Status   SARS Coronavirus 2 NEGATIVE NEGATIVE Final    Comment: (NOTE) If result is NEGATIVE SARS-CoV-2 target nucleic acids are NOT DETECTED. The SARS-CoV-2 RNA is generally detectable in upper and lower  respiratory specimens during  the acute phase of infection. The lowest  concentration of SARS-CoV-2 viral copies this assay can detect is 250  copies / mL. A negative result does not preclude SARS-CoV-2 infection  and should not be used as the sole basis for treatment or other  patient management decisions.  A negative result may occur with  improper specimen collection / handling, submission of specimen other  than nasopharyngeal swab, presence of viral mutation(s) within the  areas targeted by this assay, and inadequate number of viral copies  (<250 copies / mL). A negative result must be combined with clinical  observations, patient history, and epidemiological information. If result is POSITIVE SARS-CoV-2 target nucleic acids are DETECTED. The SARS-CoV-2 RNA is generally detectable in upper and lower  respiratory specimens dur ing the acute phase of infection.  Positive  results are indicative of active infection with SARS-CoV-2.  Clinical  correlation with patient history and other diagnostic information is  necessary to determine patient infection status.  Positive results do  not rule out bacterial infection or co-infection with other viruses. If result is PRESUMPTIVE POSTIVE SARS-CoV-2 nucleic acids MAY BE PRESENT.   A presumptive positive result was obtained on the submitted specimen  and confirmed on repeat testing.  While 2019 novel coronavirus  (SARS-CoV-2) nucleic acids may be present in the submitted sample  additional confirmatory testing may be necessary for epidemiological  and / or clinical management purposes  to differentiate between  SARS-CoV-2 and other Sarbecovirus currently known to infect humans.  If clinically indicated additional testing with an alternate test  methodology [phone]) is advised. The SARS-CoV-2 RNA is generally  detectable in upper and lower respiratory sp ecimens during the acute  phase of infection. The expected result is Negative. Fact Sheet for Patients:   StrictlyIdeas.no Fact Sheet for  Healthcare Providers: BankingDealers.co.za This test is not yet approved or cleared by the [place] and has been authorized for detection and/or diagnosis of SARS-CoV-2 by FDA under an Emergency Use Authorization (EUA).  This EUA will remain in effect (meaning this test can be used) for the duration of the COVID-19 declaration under Section 564(b)(1) of the Act, 21 U.S.C. section 360bbb-3(b)(1), unless the authorization is terminated or revoked sooner. Performed at [facility], [place] [address], [place], [place] [address]   MRSA PCR Screening     Status: None   Collection Time: 12/18/18  7:04 AM  Result Value Ref Range Status   MRSA by PCR NEGATIVE NEGATIVE Final    Comment:        The GeneXpert MRSA Assay (FDA approved for NASAL specimens only), is one component of a comprehensive MRSA colonization surveillance program. It is not intended to diagnose MRSA infection nor to guide or monitor treatment for MRSA infections. Performed at [facility], [place] [address], [place], [place] [address]   Urine culture     Status: None   Collection Time: 12/18/18  8:22 AM  Result Value Ref Range Status   Specimen Description URINE, RANDOM  Final   Special Requests NONE  Final   Culture   Final    NO GROWTH Performed at [facility], [place] [address], [place], [place] [address]    Report Status 12/19/2018 FINAL  Final    Radiology Reports Ct Head Wo Contrast  Result Date: 12/18/2018 CLINICAL DATA:  Encephalopathy EXAM: CT HEAD WITHOUT CONTRAST TECHNIQUE: Contiguous axial images were obtained from the base of the skull through the vertex without intravenous contrast. COMPARISON:  09/10/2006 FINDINGS: Brain: No evidence of acute infarction, hemorrhage, hydrocephalus, extra-axial collection or mass lesion/mass effect. The cerebellar tonsils project 1 cm below the foramen magnum and are  somewhat pointed on sagittal reformats. Question mild patchy low-density in the cerebral white matter, which would likely be early microvascular ischemia in this patient with multiple vascular risk factors. Vascular: No hyperdense vessel or unexpected calcification. Skull: Negative Sinuses/Orbits: Negative IMPRESSION: 1. No emergent finding. 2. Chiari 1 malformation. Electronically Signed   By: Monte Fantasia M.D.   On: 12/18/2018 06:55   Dg Chest Port 1 View  Result Date: 12/19/2018 CLINICAL DATA:  Pulmonary edema EXAM: PORTABLE CHEST 1 VIEW COMPARISON:  Yesterday FINDINGS: Endotracheal tube tip just below the clavicular heads. The orogastric tube reaches the stomach. Rapid normalization of lung opacity. Normal heart size. Coronary stents are present. No pneumothorax. IMPRESSION: 1. Resolved pulmonary edema. 2. Normal hardware positioning. Electronically Signed   By: Monte Fantasia M.D.   On: 12/19/2018 07:17   Dg Chest Portable 1 View  Result Date: 12/18/2018 CLINICAL DATA:  48 y/o F; intubation and OG tube placement for respiratory failure. EXAM: PORTABLE CHEST 1 VIEW COMPARISON:  07/03/2018 chest radiograph. FINDINGS: Cardiomegaly. Diffuse reticular and patchy opacities of the lungs. No pleural effusion or pneumothorax. Endotracheal tube tip projects 2.1 cm above the carina. Enteric tube tip extends below the field of view into the abdomen. Bones are unremarkable. IMPRESSION: 1. Endotracheal tube tip projects 2.1 cm above the carina. Enteric tube tip extends below the field of view into the abdomen. 2. Diffuse reticular and patchy opacities of the lungs, likely interstitial and alveolar pulmonary edema. Electronically Signed   By: Kristine Garbe M.D.   On: 12/18/2018 03:52    Lab Data:  CBC: Recent Labs  Lab 12/18/18 [id number]  12/18/18 0344 12/18/18 0447 12/18/18 0927 12/19/18 0525 12/21/18 0041  WBC 19.8*  --   --   --  17.1* 13.7*  NEUTROABS 10.0*  --   --   --   --   --   HGB  12.6 12.9 12.9 11.6* 11.7* 13.0  HCT 41.0 38.0 38.0 34.0* 36.1 39.9  MCV 87.0  --   --   --  81.5 80.3  PLT 414*  --   --   --  362 387*   Basic Metabolic Panel: Recent Labs  Lab 12/18/18 0318  12/18/18 0447 12/18/18 0927 12/18/18 1241 12/18/18 1710 12/19/18 0525 12/19/18 1655 12/20/18 0317 12/21/18 0041  NA 138   < > 137 135  --   --  137  --  139 135  K 3.6   < > 3.9 5.8*  --   --  3.3*  --  3.6 3.2*  CL 103  --   --   --   --   --  101  --  98 94*  CO2 18*  --   --   --   --   --  23  --  26 25  GLUCOSE 332*  --   --   --   --   --  192*  --  88 184*  BUN 16  --   --   --   --   --  42*  --  48* 60*  CREATININE 1.17*  --   --   --   --   --  1.23*  --  1.64* 2.10*  CALCIUM 9.1  --   --   --   --   --  9.4  --  10.4* 10.4*  MG  --   --   --   --  2.4 2.1 2.1 2.4  --  2.0  PHOS  --   --   --   --  3.5 3.8 4.5 4.3  --  5.2*   < > = values in this interval not displayed.   GFR: Estimated Creatinine Clearance: 41.2 mL/min (A) (by C-G formula based on SCr of 2.1 mg/dL (H)). Liver Function Tests: Recent Labs  Lab 12/18/18 0318  AST 44*  ALT 27  ALKPHOS 54  BILITOT 0.7  PROT 8.2*  ALBUMIN 3.4*   No results for input(s): LIPASE, AMYLASE in the last 168 hours. No results for input(s): AMMONIA in the last 168 hours. Coagulation Profile: No results for input(s): INR, PROTIME in the last 168 hours. Cardiac Enzymes: Recent Labs  Lab 12/18/18 0741 12/18/18 1710 12/19/18 0002 12/19/18 0525 12/21/18 0041  TROPONINI <0.03 0.03* <0.03 0.03* 0.03*   BNP (last 3 results) No results for input(s): PROBNP in the last 8760 hours. HbA1C: No results for input(s): HGBA1C in the last 72 hours. CBG: Recent Labs  Lab 12/20/18 1637 12/20/18 2106 12/20/18 2307 12/21/18 0010 12/21/18 0347  GLUCAP 120* 267* 213* 224* 114*   Lipid Profile: No results for input(s): CHOL, HDL, LDLCALC, TRIG, CHOLHDL, LDLDIRECT in the last 72 hours. Thyroid Function Tests: No results for  input(s): TSH, T4TOTAL, FREET4, T3FREE, THYROIDAB in the last 72 hours. Anemia Panel: No results for input(s): VITAMINB12, FOLATE, FERRITIN, TIBC, IRON, RETICCTPCT in the last 72 hours. Urine analysis:    Component Value Date/Time   LABSPEC >=1.030 10/09/2018 1659   PHURINE 5.5 10/09/2018 1659   GLUCOSEU 250 (A) 10/09/2018 1659   HGBUR TRACE (A) 10/09/2018 1659  BILIRUBINUR SMALL (A) 10/09/2018 1659   BILIRUBINUR neg 03/03/2015 1040   KETONESUR TRACE (A) 10/09/2018 1659   PROTEINUR 30 (A) 10/09/2018 1659   UROBILINOGEN 2.0 (H) 10/09/2018 1659   NITRITE POSITIVE (A) 10/09/2018 1659   LEUKOCYTESUR TRACE (A) 10/09/2018 1659     Afshin Chrystal M.D. [facility] 12/21/2018, 10:55 AM  Pager: [phone] Between 7am to 7pm - call Pager - [phone]  After 7pm go to [email or web address] - password [email or web address]  Call night coverage person covering after 7pm

## 2018-12-21 NOTE — Progress Notes (Signed)
Progress Note  Patient Name: Amy Chaney Date of Encounter: 12/21/2018  Primary Cardiologist: Glenetta Hew, MD   Subjective   Feels a little weak, but denies dyspnea. Has not walked. Reports her usual weight at home is 220-221. Hospitalization was preceded by a trip to [facility] to bury her 49 yo son. She became dyspneic during the services. She states she did not stop taking her meds - she only missed her atorvastatin. She ate family cooked meals in [place].  Inpatient Medications    Scheduled Meds: . aspirin  81 mg Oral Daily  . atorvastatin  80 mg Oral Daily  . carvedilol  3.125 mg Oral BID WC  . enoxaparin (LOVENOX) injection  40 mg Subcutaneous Q24H  . famotidine  20 mg Oral QHS  . feeding supplement (GLUCERNA SHAKE)  237 mL Oral TID BM  . furosemide  40 mg Oral Daily  . insulin aspart  0-5 Units Subcutaneous QHS  . insulin aspart  0-9 Units Subcutaneous TID WC  . insulin detemir  10 Units Subcutaneous BID  . multivitamin with minerals  1 tablet Oral Daily  . sacubitril-valsartan  1 tablet Oral BID  . spironolactone  12.5 mg Oral Daily  . ticagrelor  90 mg Oral BID   Continuous Infusions: . sodium chloride Stopped (12/18/18 1211)  . sodium chloride     PRN Meds: sodium chloride, acetaminophen, hydrALAZINE, ondansetron (ZOFRAN) IV, phenol   Vital Signs    Vitals:   12/21/18 0432 12/21/18 0500 12/21/18 0602 12/21/18 1156  BP: 119/72  137/69 100/65  Pulse:   82 76  Resp:   16 18  Temp:   98.3 F (36.8 C) 98.5 F (36.9 C)  TempSrc:   Oral Oral  SpO2:   99% 99%  Weight:  102.3 kg    Height:        Intake/Output Summary (Last 24 hours) at 12/21/2018 1320 Last data filed at 12/21/2018 0442 Gross per 24 hour  Intake 480 ml  Output 1050 ml  Net -570 ml   Last 3 Weights 12/21/2018 12/20/2018 12/20/2018  Weight (lbs) 225 lb 8.5 oz 222 lb 1.6 oz 221 lb 9 oz  Weight (kg) 102.3 kg 100.744 kg 100.5 kg      Telemetry    NSR/STachy, PVCs - Personally Reviewed  ECG    NSR, LVH, nonspecific T wave changes- Personally Reviewed  Physical Exam  Obese GEN: No acute distress.   Neck: No JVD Cardiac: RRR, no murmurs, rubs, or gallops.  Respiratory: Clear to auscultation bilaterally. GI: Soft, nontender, non-distended  MS: No edema; No deformity. Neuro:  Nonfocal  Psych: Normal affect   Labs    Chemistry Recent Labs  Lab 12/18/18 0318  12/19/18 0525 12/20/18 0317 12/21/18 0041  NA 138   < > 137 139 135  K 3.6   < > 3.3* 3.6 3.2*  CL 103  --  101 98 94*  CO2 18*  --  23 26 25   GLUCOSE 332*  --  192* 88 184*  BUN 16  --  42* 48* 60*  CREATININE 1.17*  --  1.23* 1.64* 2.10*  CALCIUM 9.1  --  9.4 10.4* 10.4*  PROT 8.2*  --   --   --   --   ALBUMIN 3.4*  --   --   --   --   AST 44*  --   --   --   --   ALT 27  --   --   --   --  ALKPHOS 54  --   --   --   --   BILITOT 0.7  --   --   --   --   GFRNONAA 55*  --  51* 36* 27*  GFRAA >60  --  60* 42* 31*  ANIONGAP 17*  --  13 15 16*   < > = values in this interval not displayed.     Hematology Recent Labs  Lab 12/18/18 0318  12/18/18 0927 12/19/18 0525 12/21/18 0041  WBC 19.8*  --   --  17.1* 13.7*  RBC 4.71  --   --  4.43 4.97  HGB 12.6   < > 11.6* 11.7* 13.0  HCT 41.0   < > 34.0* 36.1 39.9  MCV 87.0  --   --  81.5 80.3  MCH 26.8  --   --  26.4 26.2  MCHC 30.7  --   --  32.4 32.6  RDW 14.8  --   --  14.7 14.7  PLT 414*  --   --  362 418*   < > = values in this interval not displayed.    Cardiac Enzymes Recent Labs  Lab 12/18/18 1710 12/19/18 0002 12/19/18 0525 12/21/18 0041  TROPONINI 0.03* <0.03 0.03* 0.03*   No results for input(s): TROPIPOC in the last 168 hours.   BNP Recent Labs  Lab 12/18/18 0318  BNP 803.2*     DDimer  Recent Labs  Lab 12/18/18 0318  DDIMER 2.06*     Radiology    No results found.  Cardiac Studies  12/18/2018 ECHO   1. The left ventricle has severely reduced systolic function, with an ejection fraction of 20-25%. The cavity size  was normal. There is moderately increased left ventricular wall thickness. Left ventricular diastolic Doppler parameters are consistent  with pseudonormalization. Elevated mean left atrial pressure Left ventricular diffuse hypokinesis.  2. The right ventricle has normal systolic function. The cavity was normal. There is no increase in right ventricular wall thickness.  3. Left atrial size was severely dilated.  4. No evidence of mitral valve stenosis.  5. The aortic valve is tricuspid. Aortic valve regurgitation was not assessed by color flow Doppler. No stenosis of the aortic valve.  6. The inferior vena cava was dilated in size with <50% respiratory variability.  Patient Profile     48 y.o. female with CAD s/p anterior STEMI (Dec 2019), ischemic CMP, longstanding HF, admitted with acute respiratory failure due to pulmonary edema and HTNive crisis  Assessment & Plan    1. CHF, acute systolic and diastolic:  She denies med noncompliance to me. EF has dropped further (but checked during increased afterload conditions). Consider superimposed takotsubo sd. Reevaluate EF in 2-3 weeks. If still this low, she meets indication for AICD. She is just slightly above her self-reported usual weight. Switched to PO diuretics. May be ready for DC in AM> 2. CAD: no angina, ECG changes or troponin elevation to suggest acute coronary event. 3. AKI: rapid diuresis. Switched to po diuretics.     For questions or updates, please contact [place] Please consult [email or web address] for contact info under        Signed, Sanda Klein, MD  12/21/2018, 1:20 PM

## 2018-12-21 NOTE — Significant Event (Addendum)
Rapid Response Event Note  Overview:Called d/t pt syncopy on [facility].  Time Called: 0009 Arrival Time: 0010 Event Type: Neurologic  Initial Focused Assessment: Pt laying in bed with eyes opened, will say name, follow commands, and move all extremities with increased stimulation.  Over the next few minutes, pt progressively got more alert,  and is now alert and oriented x 4, however lethargic and weak. Pt also c/o being hot.  Pupils 3s and sluggish, skin warm and dry, lungs clear and diminished. T-98.1, BP-130/91, RR-16, SpO2-100% on 2L [place]. BP dropped to 81/70 after a few minutes.   Interventions: CBG-224 ABG-7.48/35/120/26 EKG-NSR CBC BMET troponin 250cc NS bolus given x 1-BP increased to 80/56 Additional 250cc NS bolus given-BP increased to 101/71 Brandi, NP to bedside, may give additional 250cc NS bolus if BP drops again. Plan of Care (if not transferred): Check BP q1h, if BP drops again, may given another 250cc NS bolus per Velna Hatchet, NP. Call RRT if further assistance needed. Event Summary: Name of Physician Notified: [place] [facility] [place]    at          [place], Carren Rang

## 2018-12-22 ENCOUNTER — Other Ambulatory Visit: Payer: Self-pay | Admitting: Nurse Practitioner

## 2018-12-22 DIAGNOSIS — I5022 Chronic systolic (congestive) heart failure: Secondary | ICD-10-CM

## 2018-12-22 LAB — BASIC METABOLIC PANEL
Anion gap: 16 — ABNORMAL HIGH (ref 5–15)
BUN: 67 mg/dL — ABNORMAL HIGH (ref 6–20)
CO2: 23 mmol/L (ref 22–32)
Calcium: 10.1 mg/dL (ref 8.9–10.3)
Chloride: 95 mmol/L — ABNORMAL LOW (ref 98–111)
Creatinine, Ser: 1.8 mg/dL — ABNORMAL HIGH (ref 0.44–1.00)
GFR calc Af Amer: 38 mL/min — ABNORMAL LOW (ref >60)
GFR calc non Af Amer: 32 mL/min — ABNORMAL LOW (ref >60)
Glucose, Bld: 256 mg/dL — ABNORMAL HIGH (ref 70–99)
Potassium: 4 mmol/L (ref 3.5–5.1)
Sodium: 134 mmol/L — ABNORMAL LOW (ref 135–145)

## 2018-12-22 LAB — GLUCOSE, CAPILLARY: Glucose-Capillary: 292 mg/dL — ABNORMAL HIGH (ref 70–99)

## 2018-12-22 MED ORDER — INSULIN DETEMIR 100 UNIT/ML ~~LOC~~ SOLN
20.0000 [IU] | Freq: Every day | SUBCUTANEOUS | Status: DC
  Administered 2018-12-22: 20 [IU] via SUBCUTANEOUS
  Filled 2018-12-22: qty 0.2

## 2018-12-22 NOTE — Progress Notes (Signed)
Progress Note  Patient Name: Amy Chaney Date of Encounter: 12/22/2018  Primary Cardiologist: Glenetta Hew, MD   Subjective   Feels well, no dizziness or dyspnea I/O balanced, net 6L diuresis since admission. Weight down 2.6 kg since admission. Pretty much at her reported dry weight of 220 lb. BP in desirable range. Creat improved from yesterday, still moderately elevated.  Inpatient Medications    Scheduled Meds:  aspirin  81 mg Oral Daily   atorvastatin  80 mg Oral Daily   carvedilol  3.125 mg Oral BID WC   enoxaparin (LOVENOX) injection  40 mg Subcutaneous Q24H   famotidine  20 mg Oral QHS   feeding supplement (GLUCERNA SHAKE)  237 mL Oral TID BM   furosemide  40 mg Oral Daily   insulin aspart  0-5 Units Subcutaneous QHS   insulin aspart  0-9 Units Subcutaneous TID WC   insulin detemir  20 Units Subcutaneous Daily   multivitamin with minerals  1 tablet Oral Daily   sacubitril-valsartan  1 tablet Oral BID   spironolactone  12.5 mg Oral Daily   ticagrelor  90 mg Oral BID   Continuous Infusions:  sodium chloride Stopped (12/18/18 1211)   sodium chloride     PRN Meds: sodium chloride, acetaminophen, hydrALAZINE, ondansetron (ZOFRAN) IV, phenol   Vital Signs    Vitals:   12/21/18 2045 12/22/18 0527 12/22/18 0527 12/22/18 0846  BP: 125/71  105/71 118/83  Pulse: 95  90 96  Resp: 18  20   Temp: 98.5 F (36.9 C)  97.9 F (36.6 C)   TempSrc: Oral  Oral   SpO2: 99%  100%   Weight:  100.6 kg    Height:        Intake/Output Summary (Last 24 hours) at 12/22/2018 1128 Last data filed at 12/22/2018 9476 Gross per 24 hour  Intake 360 ml  Output 400 ml  Net -40 ml   Last 3 Weights 12/22/2018 12/21/2018 12/20/2018  Weight (lbs) 221 lb 11.2 oz 225 lb 8.5 oz 222 lb 1.6 oz  Weight (kg) 100.562 kg 102.3 kg 100.744 kg      Telemetry    Has been disconnected - Personally Reviewed  ECG    No new tracing - Personally Reviewed  Physical Exam    Obese GEN: No acute distress.   Neck: No JVD Cardiac: RRR, no murmurs, rubs, or gallops.  Respiratory: Clear to auscultation bilaterally. GI: Soft, nontender, non-distended  MS: No edema; No deformity. Neuro:  Nonfocal  Psych: Normal affect   Labs    Chemistry Recent Labs  Lab 12/18/18 0318  12/20/18 0317 12/21/18 0041 12/22/18 0359  NA 138   < > 139 135 134*  K 3.6   < > 3.6 3.2* 4.0  CL 103   < > 98 94* 95*  CO2 18*   < > 26 25 23   GLUCOSE 332*   < > 88 184* 256*  BUN 16   < > 48* 60* 67*  CREATININE 1.17*   < > 1.64* 2.10* 1.80*  CALCIUM 9.1   < > 10.4* 10.4* 10.1  PROT 8.2*  --   --   --   --   ALBUMIN 3.4*  --   --   --   --   AST 44*  --   --   --   --   ALT 27  --   --   --   --   ALKPHOS 54  --   --   --   --  BILITOT 0.7  --   --   --   --   GFRNONAA 55*   < > 36* 27* 32*  GFRAA >60   < > 42* 31* 38*  ANIONGAP 17*   < > 15 16* 16*   < > = values in this interval not displayed.     Hematology Recent Labs  Lab 12/18/18 0318  12/18/18 0927 12/19/18 0525 12/21/18 0041  WBC 19.8*  --   --  17.1* 13.7*  RBC 4.71  --   --  4.43 4.97  HGB 12.6   < > 11.6* 11.7* 13.0  HCT 41.0   < > 34.0* 36.1 39.9  MCV 87.0  --   --  81.5 80.3  MCH 26.8  --   --  26.4 26.2  MCHC 30.7  --   --  32.4 32.6  RDW 14.8  --   --  14.7 14.7  PLT 414*  --   --  362 418*   < > = values in this interval not displayed.    Cardiac Enzymes Recent Labs  Lab 12/18/18 1710 12/19/18 0002 12/19/18 0525 12/21/18 0041  TROPONINI 0.03* <0.03 0.03* 0.03*   No results for input(s): TROPIPOC in the last 168 hours.   BNP Recent Labs  Lab 12/18/18 0318  BNP 803.2*     DDimer  Recent Labs  Lab 12/18/18 0318  DDIMER 2.06*     Radiology    No results found.  Cardiac Studies   12/18/2018 ECHO  1. The left ventricle has severely reduced systolic function, with an ejection fraction of 20-25%. The cavity size was normal. There is moderately increased left ventricular wall  thickness. Left ventricular diastolic Doppler parameters are consistent  with pseudonormalization. Elevated mean left atrial pressure Left ventricular diffuse hypokinesis. 2. The right ventricle has normal systolic function. The cavity was normal. There is no increase in right ventricular wall thickness. 3. Left atrial size was severely dilated. 4. No evidence of mitral valve stenosis. 5. The aortic valve is tricuspid. Aortic valve regurgitation was not assessed by color flow Doppler. No stenosis of the aortic valve. 6. The inferior vena cava was dilated in size with <50% respiratory variability.   Patient Profile     48 y.o. female with CAD s/p anterior STEMI (Dec 2019), ischemic CMP, longstanding HF, admitted with acute respiratory failure due to pulmonary edema and HTNive crisis   Assessment & Plan    1. CHF, acute systolic and diastolic: Clinically euvolemic, at her estimated dry weight. She denies med noncompliance to me. EF has dropped further (but checked during increased afterload conditions). Consider superimposed takotsubo sd. Reevaluate EF in 2-3 weeks. If still this low, she meets indication for AICD. She is just slightly above her self-reported usual weight. Switched to PO diuretics. May be ready for DC in AM. 2. CAD: no angina, ECG changes or troponin elevation to suggest acute coronary event. 3. AKI: rapid diuresis. Switched to po diuretics. [facility] will sign off.   Medication Recommendations:  Current meds Other recommendations (labs, testing, etc):  Echo in 1 month, BMET in 1 month Follow up as an outpatient:  Will arrange TOC visit in 2 weeks.  For questions or updates, please contact [place] Please consult [email or web address] for contact info under        Signed, Sanda Klein, MD  12/22/2018, 11:28 AM

## 2018-12-22 NOTE — Evaluation (Signed)
Physical Therapy Evaluation Patient Details Name: Amy Chaney MRN: [id number] DOB: April 03, 1971 Today's Date: 12/22/2018   History of Present Illness  48 y.o. female with CAD s/p anterior STEMI (Dec 2019), ischemic CMP, longstanding HF, admitted with acute respiratory failure due to pulmonary edema and HTN crisis  Clinical Impression  Pt pleasant with flat affect able to mobilize in room and hall without assist with tendency to reach for environmental support with gait. Pt would benefit from cane to maximize balance and stability with gait and pt agreeable but somewhat reluctant. Pt with decreased strength, activity tolerance and function with HEP provided for lower body strengthening as well as education and handout for walking program. Pt reports decreased motivation to exercise but now having to take care of 2yo grandson has helped. Pt encouraged to perform education and HEP provided. Will follow acutely to maximize balance, use of DME and independence.     Access Code: [email or web address]  URL: [email or web address]  Date: 12/22/2018  Prepared by: Marquis Buggy   Exercises Supine Hip Abduction - 20 reps - 1 sets - 1x daily - 7x weekly Seated Toe Raise - 20 reps - 1 sets - 3x daily - 7x weekly Seated Heel Raise - 20 reps - 1 sets - 3x daily - 7x weekly Seated Long Arc Quad - 20 reps - 1 sets - 1x daily - 7x weekly Seated March - 20 reps - 1 sets - 1x daily - 7x weekly Supine Straight Leg Raises - 10 reps - 3 sets - 3x daily - 7x weekly                   Follow Up Recommendations No PT follow up    Equipment Recommendations  Cane    Recommendations for Other Services       Precautions / Restrictions Precautions Precautions: Fall Restrictions Weight Bearing Restrictions: No      Mobility  Bed Mobility Overal bed mobility: Independent                Transfers Overall transfer level: Modified independent                  Ambulation/Gait Ambulation/Gait  assistance: Supervision Gait Distance (Feet): 400 Feet Assistive device: None Gait Pattern/deviations: Step-through pattern;Decreased stride length   Gait velocity interpretation: >2.62 ft/sec, indicative of community ambulatory General Gait Details: pt at times reaching out for rail pt reports out of habit vs LOB. Pt states she does not use AD at home but open to the idea of cane. Pt with slow gait and unable to significantly change speed  Stairs Stairs: Yes Stairs assistance: Modified independent (Device/Increase time) Stair Management: Alternating pattern;Forwards;One rail Right Number of Stairs: 5 General stair comments: pt steady with use of rail   Wheelchair Mobility    Modified Rankin (Stroke Patients Only)       Balance Overall balance assessment: Needs assistance;Mild deficits observed, not formally tested   Sitting balance-Leahy Scale: Good       Standing balance-Leahy Scale: Good                               Pertinent Vitals/Pain Pain Assessment: No/denies pain    Home Living Family/patient expects to be discharged to:: Private residence Living Arrangements: Other relatives Available Help at Discharge: Family;Available 24 hours/day Type of Home: Apartment Home Access: Stairs to enter Entrance Stairs-Rails: Right Entrance Stairs-Number of Steps: 4 Home Layout: One  level Home Equipment: Shower seat;Hand held shower head;Grab bars - tub/shower Additional Comments: equipment present for sister and son (with MS) who live with pt    Prior Function Level of Independence: Independent               Hand Dominance        Extremity/Trunk Assessment   Upper Extremity Assessment Upper Extremity Assessment: Generalized weakness    Lower Extremity Assessment Lower Extremity Assessment: Generalized weakness    Cervical / Trunk Assessment Cervical / Trunk Assessment: Normal  Communication   Communication: No difficulties  Cognition  Arousal/Alertness: Awake/alert Behavior During Therapy: Flat affect Overall Cognitive Status: Within Functional Limits for tasks assessed                                        General Comments      Exercises     Assessment/Plan    PT Assessment Patient needs continued PT services  PT Problem List Decreased strength;Decreased balance;Decreased activity tolerance;Decreased mobility;Decreased knowledge of use of DME       PT Treatment Interventions Gait training;Therapeutic activities;Therapeutic exercise;DME instruction;Functional mobility training;Balance training;Patient/family education    PT Goals (Current goals can be found in the Care Plan section)  Acute Rehab PT Goals Patient Stated Goal: return home, read romance novels PT Goal Formulation: With patient Time For Goal Achievement: 12/29/18 Potential to Achieve Goals: Good    Frequency Min 3X/week   Barriers to discharge Decreased caregiver support pt is caregiver for sister and son    Co-evaluation               AM-PAC PT "6 Clicks" Mobility  Outcome Measure Help needed turning from your back to your side while in a flat bed without using bedrails?: None Help needed moving from lying on your back to sitting on the side of a flat bed without using bedrails?: None Help needed moving to and from a bed to a chair (including a wheelchair)?: None Help needed standing up from a chair using your arms (e.g., wheelchair or bedside chair)?: None Help needed to walk in hospital room?: A Little Help needed climbing 3-5 steps with a railing? : A Little 6 Click Score: 22    End of Session Equipment Utilized During Treatment: Gait belt Activity Tolerance: Patient tolerated treatment well Patient left: in chair;with call bell/phone within reach;with chair alarm set Nurse Communication: Mobility status PT Visit Diagnosis: Other abnormalities of gait and mobility (R26.89);Muscle weakness (generalized)  (M62.81)    Time: 6387-5643 PT Time Calculation (min) (ACUTE ONLY): 17 min   Charges:   PT Evaluation $PT Eval Moderate Complexity: 1 Mod          [place], PT Acute Rehabilitation Services Pager: [phone] Office: [phone]   Brailyn Delman B Mesha Schamberger 12/22/2018, 10:45 AM

## 2018-12-22 NOTE — Discharge Summary (Signed)
Physician Discharge Summary   Patient ID: Amy Chaney MRN: [id number] DOB/AGE: 49-19-1972 49 y.o.  Admit date: 12/18/2018 Discharge date: 12/22/2018  Primary Care Physician:  [name], MD   Recommendations for Outpatient Follow-up:  1. Follow up with PCP in 1-2 weeks 2. Cardiology recommended 2D echo in 1 month, bmet in 1 month will arrange appointment  Home Health: None  Equipment/Devices:   Discharge Condition: stable CODE STATUS: FULL  Diet recommendation: Carb modified diet   Discharge Diagnoses:    . Acute respiratory failure with hypoxia and hypercapnia (HCC) . Pulmonary edema likely due to acute systolic and diastolic CHF with possible component of takotsubo cardiomyopathy . Diabetes type 2, uncontrolled ([place]) . Coronary artery disease involving native coronary artery of native heart with angina pectoris ([place]) . Tobacco abuse . 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   atorvastatin 80 MG tablet Commonly known as:  LIPITOR Take 1 tablet (80 mg total) by mouth daily.   blood glucose meter kit and supplies Dispense based on patient and insurance preference. Use up to four times daily as directed. (FOR ICD-10 E10.9, E11.9).   carvedilol 3.125 MG tablet Commonly known as:  COREG Take 1 tablet (3.125 mg total) by mouth 2 (two) times daily.   furosemide 40 MG tablet Commonly known as:  LASIX Take 1 tablet (40 mg total) by mouth daily.   glipiZIDE 10 MG tablet Commonly known as:  GLUCOTROL Take 1 tablet (10 mg total) by  mouth 2 (two) times daily before a meal.   glucose blood test strip Commonly known as:  True Metrix Blood Glucose Test Use as instructed   Insulin Glargine 100 UNIT/ML Solostar Pen Commonly known as:  Lantus SoloStar Inject 30 Units into the skin daily.   nitroGLYCERIN 0.4 MG SL tablet Commonly known as:  NITROSTAT Place 1 tablet (0.4 mg total) under the tongue every 5 (five) minutes as needed for chest pain. Reported on 11/25/2015   sacubitril-valsartan 24-26 MG Commonly known as:  ENTRESTO Take 1 tablet by mouth 2 (two) times daily.   spironolactone 25 MG tablet Commonly known as:  ALDACTONE Take 0.5 tablets (12.5 mg total) by mouth daily.   ticagrelor 90 MG Tabs tablet Commonly known as:  BRILINTA Take 1 tablet (90 mg total) by mouth 2 (two) times daily.   True Metrix Meter Devi 1 kit by Does not apply route 4 (four) times daily.   TRUEplus Lancets 28G Misc 28 g by Does not apply route 4 (four) times daily.        Brief H and P: For complete details please refer to admission H and P, but in brief 48 yo FM, h/o CAD, ICM, Chronic systolic heart failure EF 35%. STEMI 06/2018, HTN and noncompliance with home meds. Admitted for decompensated failure, htn crisis, SBP 240s. Patient was intubated for pulmonary edema and AHRF.  Patient was extubated on 5/22, transferred to the floor.  TRH assumed service on 5/23  Hospital Course:  Acute respiratory failure with hypoxia and hypercapnia (HCC) -Likely precipitated due to acute pulmonary edema, acute on chronic systolic CHF and hypertensive crisis -Patient was intubated for respiratory support.  She was extubated on 5/22, sats 100% on room air.     Acute on chronic combined systolic and diastolic CHF, NYHA class 3 (HCC) with acute flash  pulmonary edema -Likely component of superimposed Takotsubo's cardiomyopathy due to her son's death - Patient was placed on IV Lasix for diuresis, was intubated at the time of  admission -Cardiology was consulted. - on 5/23, overnight was hypotensive with rapid response with systolic BP in 27C, received 500 cc bolus.  IV Lasix was placed on hold and patient received IV fluid bolus. -Lasix has been now transition to oral daily, 40 mg.  Negative balance of 5.9 L at the time of discharge. - Lasix transitioned to oral 40 mg daily -2D echo showed EF of 20 to 25%, diffuse hypokinesis, normal right ventricular systolic function (EF 35 to 40% in 06/2018).  Lisinopril was discontinued.  Patient was placed on Coreg, Entresto, spironolactone, Lasix.  Needs out patient follow-up and repeat labs.  Hypokalemia -Replaced  Syncope on 5/23 -Likely orthostatic due to overdiuresis.  Feels well now -PT OT evaluation done, at baseline does not need any PT follow-up    Diabetes type 2, uncontrolled ([place]) with complications, CAD -Continue Lantus, glipizide, outpatient follow-up with PCP  History of coronary artery disease status post STEMI in 06/2018  -Cardiology following, continue aspirin, Brilinta, statin    Tobacco abuse -Counseled on tobacco cessation  Acute kidney injury on CKD stage II Baseline creatinine 1.1-1.2 Creatinine trended up to 2.1 due to diuresis, Lasix has been changed to oral, creatinine trending down, improved to 1.8 today.   Day of Discharge S: No complaints, ambulating in the hallway with PT without any difficulty.  BP 118/83 (BP Location: Left Arm)   Pulse 96   Temp 97.9 F (36.6 C) (Oral)   Resp 20   Ht 5' 9"  (1.753 m)   Wt 100.6 kg   LMP 10/20/2015   SpO2 100%   BMI 32.74 kg/m   Physical Exam: General: Alert and awake oriented x3 not in any acute distress. HEENT: anicteric sclera, pupils reactive to light and accommodation CVS: S1-S2 clear no murmur rubs or gallops Chest: clear to auscultation bilaterally, no wheezing rales or rhonchi Abdomen: soft nontender, nondistended, normal bowel sounds Extremities: no cyanosis, clubbing or  edema noted bilaterally Neuro: Cranial nerves II-XII intact, no focal neurological deficits   The results of significant diagnostics from this hospitalization (including imaging, microbiology, ancillary and laboratory) are listed below for reference.      Procedures/Studies:  Ct Head Wo Contrast  Result Date: 12/18/2018 CLINICAL DATA:  Encephalopathy EXAM: CT HEAD WITHOUT CONTRAST TECHNIQUE: Contiguous axial images were obtained from the base of the skull through the vertex without intravenous contrast. COMPARISON:  09/10/2006 FINDINGS: Brain: No evidence of acute infarction, hemorrhage, hydrocephalus, extra-axial collection or mass lesion/mass effect. The cerebellar tonsils project 1 cm below the foramen magnum and are somewhat pointed on sagittal reformats. Question mild patchy low-density in the cerebral white matter, which would likely be early microvascular ischemia in this patient with multiple vascular risk factors. Vascular: No hyperdense vessel or unexpected calcification. Skull: Negative Sinuses/Orbits: Negative IMPRESSION: 1. No emergent finding. 2. Chiari 1 malformation. Electronically Signed   By: Monte Fantasia M.D.   On: 12/18/2018 06:55   Dg Chest Port 1 View  Result Date: 12/19/2018 CLINICAL  DATA:  Pulmonary edema EXAM: PORTABLE CHEST 1 VIEW COMPARISON:  Yesterday FINDINGS: Endotracheal tube tip just below the clavicular heads. The orogastric tube reaches the stomach. Rapid normalization of lung opacity. Normal heart size. Coronary stents are present. No pneumothorax. IMPRESSION: 1. Resolved pulmonary edema. 2. Normal hardware positioning. Electronically Signed   By: Monte Fantasia M.D.   On: 12/19/2018 07:17   Dg Chest Portable 1 View  Result Date: 12/18/2018 CLINICAL DATA:  48 y/o F; intubation and OG tube placement for respiratory failure. EXAM: PORTABLE CHEST 1 VIEW COMPARISON:  07/03/2018 chest radiograph. FINDINGS: Cardiomegaly. Diffuse reticular and patchy opacities of  the lungs. No pleural effusion or pneumothorax. Endotracheal tube tip projects 2.1 cm above the carina. Enteric tube tip extends below the field of view into the abdomen. Bones are unremarkable. IMPRESSION: 1. Endotracheal tube tip projects 2.1 cm above the carina. Enteric tube tip extends below the field of view into the abdomen. 2. Diffuse reticular and patchy opacities of the lungs, likely interstitial and alveolar pulmonary edema. Electronically Signed   By: Kristine Garbe M.D.   On: 12/18/2018 03:52       LAB RESULTS: Basic Metabolic Panel: Recent Labs  Lab 12/21/18 0041 12/22/18 0359  NA 135 134*  K 3.2* 4.0  CL 94* 95*  CO2 25 23  GLUCOSE 184* 256*  BUN 60* 67*  CREATININE 2.10* 1.80*  CALCIUM 10.4* 10.1  MG 2.0  --   PHOS 5.2*  --    Liver Function Tests: Recent Labs  Lab 12/18/18 0318  AST 44*  ALT 27  ALKPHOS 54  BILITOT 0.7  PROT 8.2*  ALBUMIN 3.4*   No results for input(s): LIPASE, AMYLASE in the last 168 hours. No results for input(s): AMMONIA in the last 168 hours. CBC: Recent Labs  Lab 12/18/18 0318  12/19/18 0525 12/21/18 0041  WBC 19.8*  --  17.1* 13.7*  NEUTROABS 10.0*  --   --   --   HGB 12.6   < > 11.7* 13.0  HCT 41.0   < > 36.1 39.9  MCV 87.0  --  81.5 80.3  PLT 414*  --  362 418*   < > = values in this interval not displayed.   Cardiac Enzymes: Recent Labs  Lab 12/19/18 0525 12/21/18 0041  TROPONINI 0.03* 0.03*   BNP: Invalid input(s): POCBNP CBG: Recent Labs  Lab 12/21/18 2113 12/22/18 0605  GLUCAP 231* 292*      Disposition and Follow-up: Discharge Instructions    (HEART FAILURE PATIENTS) Call MD:  Anytime you have any of the following symptoms: 1) 3 pound weight gain in 24 hours or 5 pounds in 1 week 2) shortness of breath, with or without a dry hacking cough 3) swelling in the hands, feet or stomach 4) if you have to sleep on extra pillows at night in order to breathe.   Complete by:  As directed    Diet -  low sodium heart healthy   Complete by:  As directed    Diet Carb Modified   Complete by:  As directed    Increase activity slowly   Complete by:  As directed        DISPOSITION: [place], Enobong, MD. Schedule an appointment as soon as possible for a visit in 2 week(s).   Specialty:  Family Medicine Contact information: [address] [place] [address] [phone]  Leonie Man, MD Follow up in 2 week(s).   Specialty:  Cardiology Why:  We will arrange for follow-up and contact you.  We will also arrange for a follow-up echocardiogram in approximately 1 month. Contact information: [address] [place] [address] [phone]            Time coordinating discharge:  35-minutes  Signed:   Estill Cotta M.D. [facility] 12/22/2018, 12:47 PM

## 2018-12-23 LAB — CULTURE, BLOOD (ROUTINE X 2)
Culture: NO GROWTH
Culture: NO GROWTH
Special Requests: ADEQUATE
Special Requests: ADEQUATE

## 2018-12-24 ENCOUNTER — Telehealth: Payer: Self-pay

## 2018-12-24 NOTE — Telephone Encounter (Signed)
Transition Care Management Follow-up Telephone Call Date of discharge and from where: 12/22/2018, [facility]   Call placed to patient. Her son, Felix Ahmadi, answered and stated that she wasn't there. Message left with him to have patient return the call to this CM.

## 2018-12-25 ENCOUNTER — Telehealth: Payer: Self-pay

## 2018-12-25 ENCOUNTER — Telehealth (HOSPITAL_COMMUNITY): Payer: Self-pay | Admitting: *Deleted

## 2018-12-25 NOTE — Telephone Encounter (Signed)
Attempted to contact patient [phone]   to inform her of Dr Smitty Pluck recommendation for constipation. Message left requesting a call back to this CM # [phone]

## 2018-12-25 NOTE — Telephone Encounter (Signed)
From discharge call - patient has appointment scheduled for 12/30/2018.     She has the medication list and has all medications and has no questions.  The list was reviewed in detail.   only concern was that she has not moved her bowels since she has been home.   Has a scale  - weight this morning 225 lbs, yesterday morning she reports 221 lbs. She is keeping a log of he weights. Reviewed when to call the MD about weight gain and instructed her to call cardiology to report the weight gain and elevated BP . She said she is aware of the 4 lb weight in 1 day but she thinks it is due to not having a bowel movement.    Has a glucometer but did not check blood sugar yet today.  Instructed her to keep log of the blood sugar results.   Has wrist BP monitor. This morning BP was 153/101, yesterday ( 12/24/2018) BP was 140/90.  Before the end of the conversation, she said that the hospital was calling her. Her son had been in the hospital, so she needed to take the call.  Instructed her to call this CM back if she has any further questions.

## 2018-12-25 NOTE — Telephone Encounter (Signed)
She can use OTC MiraLAX for the constipation and if that is not effective I can prescribe something else.

## 2018-12-25 NOTE — Telephone Encounter (Signed)
[employer] pt assistance form completed and signed by Dr Haroldine Laws and faxed into [employer].

## 2018-12-25 NOTE — Telephone Encounter (Signed)
Transition Care Management Follow-up Telephone Call  Date of discharge and from where: 12/22/2018  [facility]  How have you been since you were released from the hospital? She said that she is " doing okay, trying to take it easy."   Any questions or concerns? Her concern was that she has not moved her bowels since she has been home.   Items Reviewed:  Did the pt receive and understand the discharge instructions provided? Yes, she has the instructions and did not have any questions.   Medications obtained and verified? She has the medication list and has all medications and has no questions.  The list was reviewed in detail.   Any new allergies since your discharge? None reported   Do you have support at home? yes  Other (ie: DME, Home Health, etc) no home health ordered.   Has a scale  - weight this morning 225 lbs, yesterday morning she reports 221lbs. She is keeping a log of he weights. Reviewed when to call the MD about weight gain and instructed her to call cardiology to report the weight gain and elevated BP . She said she is aware of the 4 lb weight in 1 day but she thinks it is due to not having a bowel movement.   Has a glucometer but did not check blood sugar yet today.  Instructed her to keep log of the blood sugar results.  Has wrist BP monitor. This morning BP was 153/101, yesterday ( 12/24/2018) BP was 140/90.  Before the end of the conversation, she said that the hospital was calling her. Her son had been in the hospital, so she needed to take the call.  Instructed her to call this CM back if she has any further questions.    Functional Questionnaire: (I = Independent and D = Dependent) ADL's: independent   Follow up appointments reviewed:    PCP Hospital f/u appt confirmed? Appointment scheduled for 12/30/2018 @ 1050 with Dr Margarita Rana. Informed her that she would be contacted the day prior to the visit to confirm televisit or in-person visit.   [facility] f/u appt confirmed? She still needs to schedule her appointment with cardiology  Are transportation arrangements needed? No   If their condition worsens, is the pt aware to call  their PCP or go to the ED? yes  Was the patient provided with contact information for the PCP's office or ED? She has the phone number  Was the pt encouraged to call back with questions or concerns? yes

## 2018-12-26 ENCOUNTER — Telehealth: Payer: Self-pay

## 2018-12-26 ENCOUNTER — Other Ambulatory Visit (HOSPITAL_COMMUNITY): Payer: Self-pay | Admitting: *Deleted

## 2018-12-26 DIAGNOSIS — Z1231 Encounter for screening mammogram for malignant neoplasm of breast: Secondary | ICD-10-CM

## 2018-12-26 NOTE — Telephone Encounter (Signed)
Attempted to contact patient [phone]   to inform her of Dr Smitty Pluck recommendation for constipation. Message left with son, Felix Ahmadi requesting a call back to this CM.

## 2018-12-30 ENCOUNTER — Ambulatory Visit: Payer: Self-pay | Attending: Family Medicine | Admitting: Family Medicine

## 2018-12-30 ENCOUNTER — Other Ambulatory Visit: Payer: Self-pay

## 2018-12-30 ENCOUNTER — Encounter: Payer: Self-pay | Admitting: Family Medicine

## 2018-12-30 ENCOUNTER — Telehealth (HOSPITAL_COMMUNITY): Payer: Self-pay | Admitting: *Deleted

## 2018-12-30 VITALS — BP 133/80 | HR 59 | Temp 97.7°F | Ht 69.0 in | Wt 232.6 lb

## 2018-12-30 DIAGNOSIS — I5181 Takotsubo syndrome: Secondary | ICD-10-CM

## 2018-12-30 DIAGNOSIS — I10 Essential (primary) hypertension: Secondary | ICD-10-CM

## 2018-12-30 DIAGNOSIS — E11641 Type 2 diabetes mellitus with hypoglycemia with coma: Secondary | ICD-10-CM

## 2018-12-30 DIAGNOSIS — I5042 Chronic combined systolic (congestive) and diastolic (congestive) heart failure: Secondary | ICD-10-CM

## 2018-12-30 DIAGNOSIS — Z634 Disappearance and death of family member: Secondary | ICD-10-CM

## 2018-12-30 LAB — GLUCOSE, POCT (MANUAL RESULT ENTRY): POC Glucose: 255 mg/dl — AB (ref 70–99)

## 2018-12-30 MED ORDER — HYDROXYZINE HCL 25 MG PO TABS: 25.0000 mg | ORAL_TABLET | Freq: Three times a day (TID) | ORAL | 1 refills | Status: DC | PRN

## 2018-12-30 MED FILL — hydrOXYzine HCL 25 MG TABS: 25 | 20 days supply | Qty: 60 | Fill #0

## 2018-12-30 NOTE — Patient Instructions (Signed)

## 2018-12-30 NOTE — Progress Notes (Signed)
Subjective:  Patient ID: Amy Chaney, female    DOB: 04-05-1971  Age: 48 y.o. MRN: [id number]  CC: Diabetes   HPI Amy Chaney  is a 48 year old female with a history of type 2 diabetes mellitus (A1c 9.9), hypertension, CHF (EF 20 to 25% in 11/2018 decreased from 35% in3/2019), CAD (status post PCI to LAD with 3 overlapping DES in 09/2017), last STEMI was in 06/2018, recent hospitalization for flash pulmonary edema and Takotsubo cardiomyopathy from 12/18/2018 through 12/22/2018 at [facility].  She had presented with worsening shortness of breath and found to be in acute respiratory failure with hypoxia and hypercapnia and hypertensive urgency with systolic blood pressures in the 240.  Troponins were 0.033 she was intubated for respiratory support.  Her son had recently passed on Mother's Day. 2D echo revealed: IMPRESSIONS    1. The left ventricle has severely reduced systolic function, with an ejection fraction of 20-25%. The cavity size was normal. There is moderately increased left ventricular wall thickness. Left ventricular diastolic Doppler parameters are consistent  with pseudonormalization. Elevated mean left atrial pressure Left ventricular diffuse hypokinesis.  2. The right ventricle has normal systolic function. The cavity was normal. There is no increase in right ventricular wall thickness.  3. Left atrial size was severely dilated.  4. No evidence of mitral valve stenosis.  5. The aortic valve is tricuspid. Aortic valve regurgitation was not assessed by color flow Doppler. No stenosis of the aortic valve.  6. The inferior vena cava was dilated in size with <50% respiratory variability.  She was treated with diuretics which were later held due to hypotension and she was subsequently treated with IV fluids.  Her creatinine also trended up to 2.1 from baseline of 1.1 and lisinopril was held. Condition improved and she was subsequently discharged.    Today she reports doing well and has been compliant with her medications, denies dyspnea, pedal edema but she does have episodes of insomnia and she finds herself crying a lot.  Denies suicidal ideation ideations or intent.  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 Her appointment with cardiology comes up at the end of the month. She complains of bruising at the sites of needle sticks during hospitalization.  Past Medical History:  Diagnosis Date  . Coronary artery disease involving native coronary artery of native heart with angina pectoris ([place]) 01/2013   a. s/p PCI of the mid RCA 01/2013 //  b. LHC 9/14: EF 55%, RCA stent 100% occluded, LAD irregularities >>  subacute stent thrombosis, Promus DES PCI distal overlap // c) 09/2017 - Ant STEMI - LAD-D2 PCI - Successful PCI of LAD (3 overlapping DES), unable to restore flow down D2l that was stented as well.  Likely related to downstream dissection, but unable to rewire.  . Daily headache   . Depression   . Dyslipidemia, goal LDL below 70   . History of Doppler ultrasound    carotid bruit >> a. Carotid US 6/17: bilat ICA 1-39%  . Hypertension   . STEMI involving left anterior descending coronary artery ([place]) 09/2017; 06/2018   a) Severe [place] 1, 1, 1 LAD-D2 lesion (complicated by post PTCA dissection/intramural hematoma) -successful extensive PCI of the LAD but unable to maintain patency of the stented D2. b) distal mLAD stent Edge 100% thrombosis --> PTCA & Overlapping DES PCI (Synergy 3 x 12 --> 3.6-3.3 mm). D2 occluded. RCA stents patent, 65 %  OM2. EF 30-35%.  Marland Kitchen STEMI involving oth coronary artery of inferior wall ([place]) 03/2013   Secondary to subacute stent thrombosis of RCA stent segment having missed 4 doses of Effient.  . Tobacco abuse 01/11/2016  . Type II diabetes mellitus ([place]) 12/2010   sister and son also diabetic     Past Surgical History:  Procedure Laterality Date  . CHOLECYSTECTOMY  ~  2000  . CORONARY STENT INTERVENTION N/A 10/10/2017   Procedure: CORONARY STENT INTERVENTION;  Surgeon: Leonie Man, MD;  Location: [place] [facility];  Service: Cardiovascular; LAD PCI: STENT SYNERGY DES 3.5X32 (p),STENT SYNERGY DES 3.5X 8 (m), STENT SYNERGY DES 3.5X28 (d).  D2 PCI: STENT SYNERGY DES 2.5X24.  (UNABL TO REWIRE & EXPAND - TO OF DIAG AT END OF CASE)  . CORONARY STENT INTERVENTION N/A 07/03/2018   Procedure: CORONARY STENT INTERVENTION;  Surgeon: Leonie Man, MD;  Location: [facility];; PTCA & overlapping DES PCI (overlaps prior stents distally) - Synergy DES 3 x 12 (3.6 mm @ overlap -- 3.3 mm distally)>  . LEFT HEART CATH AND CORONARY ANGIOGRAPHY N/A 10/10/2017   Procedure: LEFT HEART CATH AND CORONARY ANGIOGRAPHY;  Surgeon: Leonie Man, MD;  Location: [place] [facility];  Service: Cardiovascular;  Laterality: N/A; -patent RCA stents with mild in-stent restenosis. [place] 1,1,1 mLD-D2 54% (complicated by dissection/intramural thrombus)  . LEFT HEART CATH AND CORONARY ANGIOGRAPHY N/A 07/03/2018   Procedure: LEFT HEART CATH AND CORONARY ANGIOGRAPHY;  Surgeon: Leonie Man, MD;  Location: [place] [facility];; 100% thrombotic occlusion distal LAD stent -- DES PCI. continued 100% D2 (previously lost). patent RCA stents, 65% OM2.  EF 30-35%.  Marland Kitchen LEFT HEART CATHETERIZATION WITH CORONARY ANGIOGRAM N/A 02/25/2013   Procedure: LEFT HEART CATHETERIZATION WITH CORONARY ANGIOGRAM;  Surgeon: Laverda Page, MD;  Location[facility] CATH LAB;  Service: Cardiovascular;; CTO m RCA; otherwise normal coronaries  . LEFT HEART CATHETERIZATION WITH CORONARY ANGIOGRAM N/A 04/01/2013   Procedure: LEFT HEART CATHETERIZATION WITH CORONARY ANGIOGRAM;  Surgeon: Laverda Page, MD;  Location: [facility] CATH LAB;  Service: Cardiovascular: 100% occlusion of distal RCA stent (subacute stent thrombosis -  secondary to stopping Effient).  Overlapping DES PCI  . NM MYOVIEW LTD  12/2015    EF 38%.  Hypertensive  response to exercise (231/132 mmHg).  INTERMEDIATE RISK due to -diffuse hypokinesis and reduced EF.  No ischemia or infarction.  Marland Kitchen PERCUTANEOUS CORONARY STENT INTERVENTION (PCI-S)  04/01/2013   Procedure: PERCUTANEOUS CORONARY STENT INTERVENTION (PCI-S);  Surgeon: Laverda Page, MD;  Location: [facility] CATH LAB;  Service: Cardiovascular;; PCI of distal RCA stent subacute thrombosis: Promus Premier DES 3.0 mm x 20 mm.  Marland Kitchen PERCUTANEOUS CORONARY STENT INTERVENTION (PCI-S)  02/25/2013   Procedure: PERCUTANEOUS CORONARY STENT INTERVENTION (PCI-S);  Surgeon: Laverda Page, MD;  Location: [facility] CATH LAB;  RCA CTO PCI with overlapping Promus DES: 3.0 mm x 38 mm, 3.5 mm x 27m  . TRANSTHORACIC ECHOCARDIOGRAM  12/2015   mild LVH, EF 55-60%, no RWMA, Gr 1 DD, mild MR  . TRANSTHORACIC ECHOCARDIOGRAM  09/2017   In setting of anterior STEMI:  EF 35% with mild concentric hypertrophy.  GRII DD.  Anteroseptal, anterior and anterolateral walls hypokinetic.  Moderate MR.  . TRANSTHORACIC ECHOCARDIOGRAM  07/04/2018   recurrent Anterior STEMI: EF 35-40%.  Apical hypokinesis.  GRII DD.  No thrombus noted.  (Improved EF from 30% up to 35-40% from previous echo)  . [place]  Family History  Problem Relation Age of Onset  . Diabetes Mother   . Diabetes Sister   . Diabetes Sister   . Diabetes Son        type 1  . Diabetes Son        type 2    No Known Allergies  Outpatient Medications Prior to Visit  Medication Sig Dispense Refill  . aspirin EC 81 MG tablet Take 1 tablet (81 mg total) by mouth daily. 90 tablet 3  . atorvastatin (LIPITOR) 80 MG tablet Take 1 tablet (80 mg total) by mouth daily. 90 tablet 1  . blood glucose meter kit and supplies Dispense based on patient and insurance preference. Use up to four times daily as directed. (FOR ICD-10 E10.9, E11.9). 1 each 0  . Blood Glucose Monitoring Suppl (TRUE METRIX METER) DEVI 1 kit by Does not apply route 4 (four) times daily. 1 Device 0  .  carvedilol (COREG) 3.125 MG tablet Take 1 tablet (3.125 mg total) by mouth 2 (two) times daily. 180 tablet 1  . furosemide (LASIX) 40 MG tablet Take 1 tablet (40 mg total) by mouth daily. 30 tablet 11  . glipiZIDE (GLUCOTROL) 10 MG tablet Take 1 tablet (10 mg total) by mouth 2 (two) times daily before a meal. 180 tablet 1  . glucose blood (TRUE METRIX BLOOD GLUCOSE TEST) test strip Use as instructed 100 each 12  . Insulin Glargine (LANTUS SOLOSTAR) 100 UNIT/ML Solostar Pen Inject 30 Units into the skin daily. 30 mL 3  . nitroGLYCERIN (NITROSTAT) 0.4 MG SL tablet Place 1 tablet (0.4 mg total) under the tongue every 5 (five) minutes as needed for chest pain. Reported on 11/25/2015 25 tablet 3  . sacubitril-valsartan (ENTRESTO) 24-26 MG Take 1 tablet by mouth 2 (two) times daily. 60 tablet 11  . spironolactone (ALDACTONE) 25 MG tablet Take 0.5 tablets (12.5 mg total) by mouth daily. 30 tablet 11  . ticagrelor (BRILINTA) 90 MG TABS tablet Take 1 tablet (90 mg total) by mouth 2 (two) times daily. 60 tablet 11  . TRUEPLUS LANCETS 28G MISC 28 g by Does not apply route 4 (four) times daily. 120 each 2   No facility-administered medications prior to visit.      ROS Review of Systems  Constitutional: Negative for activity change, appetite change and fatigue.  HENT: Negative for congestion, sinus pressure and sore throat.   Eyes: Negative for visual disturbance.  Respiratory: Negative for cough, chest tightness, shortness of breath and wheezing.   Cardiovascular: Negative for chest pain and palpitations.  Gastrointestinal: Negative for abdominal distention, abdominal pain and constipation.  Endocrine: Negative for polydipsia.  Genitourinary: Negative for dysuria and frequency.  Musculoskeletal: Negative for arthralgias and back pain.  Skin: Negative for rash.  Neurological: Negative for tremors, light-headedness and numbness.  Hematological: Does not bruise/bleed easily.  Psychiatric/Behavioral:  Positive for dysphoric mood. Negative for agitation and behavioral problems.    Objective:  BP 133/80   Pulse (!) 59   Temp 97.7 F (36.5 C) (Oral)   Ht _0  (1.753 m)   Wt 232 lb 9.6 oz (105.5 kg)   LMP 10/20/2015   SpO2 100%   BMI 34.35 kg/m   BP/Weight 12/30/2018 6/94/8546 09/06/348  Systolic BP 093 818 -  Diastolic BP 80 83 -  Wt. (Lbs) 232.6 221.7 207  BMI 34.35 32.74 30.57      Physical Exam Constitutional:      Appearance: She is well-developed.  Neck:  Comments: No JVD Cardiovascular:     Rate and Rhythm: Normal rate.     Heart sounds: Normal heart sounds. No murmur.  Pulmonary:     Effort: Pulmonary effort is normal.     Breath sounds: Normal breath sounds. No wheezing or rales.  Chest:     Chest wall: No tenderness.  Abdominal:     General: Bowel sounds are normal. There is no distension.     Palpations: Abdomen is soft. There is no mass.     Tenderness: There is no abdominal tenderness.  Musculoskeletal: Normal range of motion.  Skin:    Comments: Bruising in left abdominal wall at site of Lovenox injection  Neurological:     Mental Status: She is alert and oriented to person, place, and time.  Psychiatric:     Comments: Dysphoric mood     CMP Latest Ref Rng & Units 12/22/2018 12/21/2018 12/20/2018  Glucose 70 - 99 mg/dL 256(H) 184(H) 88  BUN 6 - 20 mg/dL 67(H) 60(H) 48(H)  Creatinine 0.44 - 1.00 mg/dL 1.80(H) 2.10(H) 1.64(H)  Sodium 135 - 145 mmol/L 134(L) 135 139  Potassium 3.5 - 5.1 mmol/L 4.0 3.2(L) 3.6  Chloride 98 - 111 mmol/L 95(L) 94(L) 98  CO2 22 - 32 mmol/L _0 Calcium 8.9 - 10.3 mg/dL 10.1 10.4(H) 10.4(H)  Total Protein 6.5 - 8.1 g/dL - - -  Total Bilirubin 0.3 - 1.2 mg/dL - - -  Alkaline Phos 38 - 126 U/L - - -  AST 15 - 41 U/L - - -  ALT 0 - 44 U/L - - -    Lipid Panel     Component Value Date/Time   CHOL 164 07/05/2018 0330   TRIG 85 12/18/2018 0318   HDL 39 (L) 07/05/2018 0330   CHOLHDL 4.2 07/05/2018 0330   VLDL  15 07/05/2018 0330   LDLCALC 110 (H) 07/05/2018 0330    CBC    Component Value Date/Time   WBC 13.7 (H) 12/21/2018 0041   RBC 4.97 12/21/2018 0041   HGB 13.0 12/21/2018 0041   HCT 39.9 12/21/2018 0041   PLT 418 (H) 12/21/2018 0041   MCV 80.3 12/21/2018 0041   MCH 26.2 12/21/2018 0041   MCHC 32.6 12/21/2018 0041   RDW 14.7 12/21/2018 0041   LYMPHSABS 8.0 (H) 12/18/2018 0318   MONOABS 1.0 12/18/2018 0318   EOSABS 0.5 12/18/2018 0318   BASOSABS 0.2 (H) 12/18/2018 0318    Lab Results  Component Value Date   HGBA1C 8.2 (H) 12/21/2018    Assessment & Plan:   1. Uncontrolled type 2 diabetes mellitus with hypoglycemia and coma (HCC) A1c of 8.2 which has improved compared to 9.9 previously No regimen change today Counseled on Diabetic diet, my plate method, 389 minutes of moderate intensity exercise/week Keep blood sugar logs with fasting goals of 80-120 mg/dl, random of less than 180 and in the event of sugars less than 60 mg/dl or greater than 400 mg/dl please notify the clinic ASAP. It is recommended that you undergo annual eye exams and annual foot exams. Pneumonia vaccine is recommended. - POCT glucose (manual entry) - CMP14+EGFR - Microalbumin/Creatinine Ratio, Urine  2. Bereavement Recent loss of her son We have exploited counseling options including grief counseling which she is not interested at this time Commence hydroxyzine to help with anxiety and sleep - hydrOXYzine (ATARAX/VISTARIL) 25 MG tablet; Take 1 tablet (25 mg total) by mouth 3 (three) times daily as needed.  Dispense: 60 tablet;  Refill: 1  3. Chronic combined systolic (congestive) and diastolic (congestive) heart failure (HCC) EF 20 to 25% from echo 11/2018 Euvolemic Continue Lasix, Entresto, spironolactone Keep appointment with cardiology  4. Takotsubo cardiomyopathy/CAD Secondary to recent bereavement Status post stent Currently on Brilinta  5. Essential hypertension Controlled Counseled on  blood pressure goal of less than 130/80, low-sodium, DASH diet, medication compliance, 150 minutes of moderate intensity exercise per week. Discussed medication compliance, adverse effects.   Advised to apply ice packs at site of bruises; reassurance provided.  Meds ordered this encounter  Medications  . hydrOXYzine (ATARAX/VISTARIL) 25 MG tablet    Sig: Take 1 tablet (25 mg total) by mouth 3 (three) times daily as needed.    Dispense:  60 tablet    Refill:  1    Follow-up: Return in about 3 months (around 04/01/2019) for medical conditions.       [name], MD, FAAFP. [facility] [place], [place]   12/30/2018, 4:49 PM

## 2018-12-30 NOTE — Telephone Encounter (Signed)
Received fax from [employer] pt assistance, pt has been approved to receive Entresto for free through 12/27/2019

## 2018-12-31 ENCOUNTER — Other Ambulatory Visit: Payer: Self-pay | Admitting: Physician Assistant

## 2018-12-31 ENCOUNTER — Telehealth (HOSPITAL_COMMUNITY): Payer: Self-pay | Admitting: Licensed Clinical Social Worker

## 2018-12-31 ENCOUNTER — Telehealth: Payer: Self-pay | Admitting: Cardiology

## 2018-12-31 LAB — CMP14+EGFR
ALT: 12 IU/L (ref 0–32)
AST: 9 IU/L (ref 0–40)
Albumin/Globulin Ratio: 1.1 — ABNORMAL LOW (ref 1.2–2.2)
Albumin: 3.8 g/dL (ref 3.8–4.8)
Alkaline Phosphatase: 50 IU/L (ref 39–117)
BUN/Creatinine Ratio: 14 (ref 9–23)
BUN: 12 mg/dL (ref 6–24)
Bilirubin Total: 0.3 mg/dL (ref 0.0–1.2)
CO2: 22 mmol/L (ref 20–29)
Calcium: 9.2 mg/dL (ref 8.7–10.2)
Chloride: 104 mmol/L (ref 96–106)
Creatinine, Ser: 0.84 mg/dL (ref 0.57–1.00)
GFR calc Af Amer: 94 mL/min/{1.73_m2} (ref >59)
GFR calc non Af Amer: 82 mL/min/{1.73_m2} (ref >59)
Globulin, Total: 3.4 g/dL (ref 1.5–4.5)
Glucose: 227 mg/dL — ABNORMAL HIGH (ref 65–99)
Potassium: 4.5 mmol/L (ref 3.5–5.2)
Sodium: 139 mmol/L (ref 134–144)
Total Protein: 7.2 g/dL (ref 6.0–8.5)

## 2018-12-31 MED FILL — TRUE METRIX TEST STRIP: 25 days supply | Qty: 100 | Fill #0

## 2018-12-31 NOTE — Telephone Encounter (Signed)
lmtcb - see staff message patient needs virtual TOC visit.  Should be within 7 days of 12/22/18 and an echo in 1 month per Ignacia Bayley. Virtual visit ok. Thanks.

## 2018-12-31 NOTE — Telephone Encounter (Signed)
Clinic received notification that pt has been approved to receive Entresto through the [employer] until 12/27/19.  CSW called pt to inform of approval- pt reports she had not heard from [employer] yet so CSW provided pt with their number and instructed her to call and set up initial shipment of medication- pt expressed understanding.  CSW will continue to follow and assist as needed.  Jorge Ny, LCSW Clinical Social Worker [facility] Desk#: [phone] Cell#: [phone]

## 2019-01-16 ENCOUNTER — Ambulatory Visit (HOSPITAL_COMMUNITY)
Admission: RE | Admit: 2019-01-16 | Discharge: 2019-01-16 | Disposition: A | Discharge status: Home / Self Care | Payer: Medicaid Other | Source: Ambulatory Visit | Attending: Obstetrics and Gynecology | Admitting: Obstetrics and Gynecology

## 2019-01-16 ENCOUNTER — Encounter (HOSPITAL_COMMUNITY): Payer: Self-pay

## 2019-01-16 ENCOUNTER — Other Ambulatory Visit: Payer: Self-pay

## 2019-01-16 DIAGNOSIS — Z1239 Encounter for other screening for malignant neoplasm of breast: Secondary | ICD-10-CM

## 2019-01-16 NOTE — Progress Notes (Signed)
Patient referred to [facility] by the [facility] due to having an abnormal Pap smear 08/21/2018 that a colposcopy is recommended for follow-up.  Pap Smear: Pap smear not completed today. Last Pap smear was 08/21/2018 at [facility] and [facility] with positive HPV. Referred patient to the [facility] for a colposcopy to follow-up for her abnormal Pap smear. Appointment scheduled for Thursday, January 23, 2019 at 1355. Per patient has no history of an abnormal Pap smear prior to her most recent Pap smear. Last two Pap smear results are in Epic.  Physical exam: Breasts Breasts symmetrical. No skin abnormalities bilateral breasts. No nipple retraction bilateral breasts. No nipple discharge bilateral breasts. No lymphadenopathy. No lumps palpated bilateral breasts. No complaints of pain or tenderness on exam. Referred patient to the [facility] for a screening mammogram. Appointment scheduled for Tuesday, January 21, 2019 at 1310.        Pelvic/Bimanual No Pap smear completed today since last Pap smear and HPV typing was 08/21/2018. Pap smear not indicated per BCCCP guidelines.   Smoking History: Patient is a current smoker. Discussed smoking cessation with patient. Referred to the [facility] Quitline and gave resources to the free smoking cessation classes at [facility].  Patient Navigation: Patient education provided. Access to services provided for patient through BCCCP program.   Breast and Cervical Cancer Risk Assessment: Patient has no family history of breast cancer, known genetic mutations, or radiation treatment to the chest before age 37. Patient has no history of cervical dysplasia, immunocompromised, or DES exposure in-utero.  Risk Assessment    Risk Scores      01/16/2019   Last edited by: Armond Hang, LPN   5-year risk: 1.1 %   Lifetime risk: 8.8 %

## 2019-01-16 NOTE — Patient Instructions (Signed)
Explained breast self awareness with [name]. Patient did not need a Pap smear today due to last Pap smear was 08/21/2018. Explained the colposcopy the recommended follow-up for her abnormal Pap smear. Referred patient to the [facility] for a colposcopy to follow-up for her abnormal Pap smear. Appointment scheduled for Thursday, January 23, 2019 at 1355. Referred patient to the [place] for a screening mammogram. Appointment scheduled for Tuesday, January 21, 2019 at 1310. Patient aware of appointments and will be there. Let patient know the [facility] will follow up with her within the next couple weeks with results of mammogram by letter or phone. Discussed smoking cessation with patient. Referred to the [facility] Quitline and gave resources to the free smoking cessation classes at [facility]. [name] verbalized understanding.  Rylynn Schoneman, [place], RN 3:45 PM

## 2019-01-17 ENCOUNTER — Telehealth: Payer: Self-pay | Admitting: Adult Health

## 2019-01-17 NOTE — Telephone Encounter (Signed)
smartphone/ consent/ my chart via emailed/ pre reg completed  °

## 2019-01-21 ENCOUNTER — Other Ambulatory Visit: Payer: Self-pay

## 2019-01-21 ENCOUNTER — Ambulatory Visit
Admission: RE | Admit: 2019-01-21 | Discharge: 2019-01-21 | Disposition: A | Discharge status: Home / Self Care | Payer: No Typology Code available for payment source | Source: Ambulatory Visit | Attending: Family Medicine | Admitting: Family Medicine

## 2019-01-21 ENCOUNTER — Encounter: Payer: Self-pay | Admitting: *Deleted

## 2019-01-21 DIAGNOSIS — Z1239 Encounter for other screening for malignant neoplasm of breast: Secondary | ICD-10-CM

## 2019-01-22 ENCOUNTER — Telehealth: Payer: Self-pay | Admitting: Obstetrics & Gynecology

## 2019-01-22 MED FILL — glipiZIDE 10 MG TABS: 10 | 30 days supply | Qty: 60 | Fill #3

## 2019-01-22 MED FILL — FUROSEMIDE 40 MG TAB: 40 | 30 days supply | Qty: 30 | Fill #0

## 2019-01-22 MED FILL — BRILINTA 90 MG TABLET: 90 | 30 days supply | Qty: 60 | Fill #3

## 2019-01-22 MED FILL — SPIRONOLACTONE 25 MG TABLET: 25 | 30 days supply | Qty: 15 | Fill #0

## 2019-01-22 MED FILL — CARVEDILOL 3.125 MG TABLET: 3.125 | 30 days supply | Qty: 60 | Fill #0

## 2019-01-22 NOTE — Progress Notes (Signed)
Virtual Visit via Telephone Note   This visit type was conducted due to national recommendations for restrictions regarding the COVID-19 Pandemic (e.g. social distancing) in an effort to limit this patient's exposure and mitigate transmission in our community.  Due to her co-morbid illnesses, this patient is at least at moderate risk for complications without adequate follow up.  This format is felt to be most appropriate for this patient at this time.  The patient did not have access to video technology/had technical difficulties with video requiring transitioning to audio format only (telephone).  All issues noted in this document were discussed and addressed.  No physical exam could be performed with this format.  Please refer to the patient's chart for her  consent to telehealth for [facility].   Date:  01/22/2019   ID:  Amy Chaney, DOB 01-23-1971, MRN [id number]  Patient Location: Home Provider Location: Office  PCP:  [name], MD  Cardiologist:  Glenetta Hew, MD  Electrophysiologist:  None   Evaluation Performed:  Follow-Up Visit  Chief Complaint: Posthospitalization follow-up  History of Present Illness:    Amy Chaney is a 48 y.o. female with we are seeing posthospitalization follow-up after admission for acute respiratory failure in the setting of pulmonary edema and hypertensive crisis.  The patient was admitted on 12/18/2018, blood pressure at the time revealed systolic levels of 440, due to respiratory failure and pulmonary edema she was intubated for respiratory support.  She was treated with IV Lasix for diuresis.    She became very hypotensive with blood pressure in the 34V systolic and diuresis was held.  Lasix was eventually transitioned to oral dose of 40 mg daily.  She did diurese 5.9 L at the time of discharge on 12/22/2018.  She has a history of chronic systolic CHF, hypertension, medical noncompliance, ischemic cardiomyopathy, CAD with prior stents  to the LAD x3, diagonal, and RCA, Diabetes, morbid obesity, ongoing tobacco abuse, and hyperlipidemia..  Echocardiogram was completed during hospitalization with an EF of 20% to 25% with diffuse hypokinesis, normal right ventricular systolic function.  EF was decreased from 35 to 40% per echo in 06/2018.  The patient was placed on Entresto, Coreg, spironolactone, and Lasix.   Today she states she is "okay" and is currently on her way to the doctor's office for her sons physician visit.  The patient is medically compliant but is not weighing herself daily as directed.  She does not have a way of taking her blood pressure consistently.  She is also having trouble affording her medications but is now on a program for the [facility].  She has not taken Brilinta for 2 days and is asking family for help to get this medication refilled.  She is asking for samples.  She is doing her best to avoid salt but was unaware of how weak her heart function is.  I have explained this in detail to her and also her reduction of life expectancy should discontinue to diminish and her blood pressure not be controlled.  The patient does not  have symptoms concerning for COVID-19 infection (fever, chills, cough, or new shortness of breath).    Past Medical History:  Diagnosis Date  . Coronary artery disease involving native coronary artery of native heart with angina pectoris ([place]) 01/2013   a. s/p PCI of the mid RCA 01/2013 //  b. LHC 9/14: EF 55%, RCA stent 100% occluded, LAD irregularities >>  subacute stent thrombosis, Promus DES PCI distal overlap //  c) 09/2017 - Ant STEMI - LAD-D2 PCI - Successful PCI of LAD (3 overlapping DES), unable to restore flow down D2l that was stented as well.  Likely related to downstream dissection, but unable to rewire.  . Daily headache   . Depression   . Dyslipidemia, goal LDL below 70   . History of Doppler ultrasound    carotid bruit >> a. Carotid US 6/17: bilat ICA 1-39%  .  Hypertension   . STEMI involving left anterior descending coronary artery ([place]) 09/2017; 06/2018   a) Severe [place] 1, 1, 1 LAD-D2 lesion (complicated by post PTCA dissection/intramural hematoma) -successful extensive PCI of the LAD but unable to maintain patency of the stented D2. b) distal mLAD stent Edge 100% thrombosis --> PTCA & Overlapping DES PCI (Synergy 3 x 12 --> 3.6-3.3 mm). D2 occluded. RCA stents patent, 65 % OM2. EF 30-35%.  Marland Kitchen STEMI involving oth coronary artery of inferior wall ([place]) 03/2013   Secondary to subacute stent thrombosis of RCA stent segment having missed 4 doses of Effient.  . Tobacco abuse 01/11/2016  . Type II diabetes mellitus ([place]) 12/2010   sister and son also diabetic    Past Surgical History:  Procedure Laterality Date  . CHOLECYSTECTOMY  ~ 2000  . CORONARY STENT INTERVENTION N/A 10/10/2017   Procedure: CORONARY STENT INTERVENTION;  Surgeon: Leonie Man, MD;  Location: [place] [facility];  Service: Cardiovascular; LAD PCI: STENT SYNERGY DES 3.5X32 (p),STENT SYNERGY DES 3.5X 8 (m), STENT SYNERGY DES 3.5X28 (d).  D2 PCI: STENT SYNERGY DES 2.5X24.  (UNABL TO REWIRE & EXPAND - TO OF DIAG AT END OF CASE)  . CORONARY STENT INTERVENTION N/A 07/03/2018   Procedure: CORONARY STENT INTERVENTION;  Surgeon: Leonie Man, MD;  Location: [facility];; PTCA & overlapping DES PCI (overlaps prior stents distally) - Synergy DES 3 x 12 (3.6 mm @ overlap -- 3.3 mm distally)>  . LEFT HEART CATH AND CORONARY ANGIOGRAPHY N/A 10/10/2017   Procedure: LEFT HEART CATH AND CORONARY ANGIOGRAPHY;  Surgeon: Leonie Man, MD;  Location: [place] [facility];  Service: Cardiovascular;  Laterality: N/A; -patent RCA stents with mild in-stent restenosis. [place] 1,1,1 mLD-D2 24% (complicated by dissection/intramural thrombus)  . LEFT HEART CATH AND CORONARY ANGIOGRAPHY N/A 07/03/2018   Procedure: LEFT HEART CATH AND CORONARY ANGIOGRAPHY;  Surgeon: Leonie Man, MD;  Location: [place] [facility];; 100% thrombotic occlusion distal LAD stent -- DES PCI. continued 100% D2 (previously lost). patent RCA stents, 65% OM2.  EF 30-35%.  Marland Kitchen LEFT HEART CATHETERIZATION WITH CORONARY ANGIOGRAM N/A 02/25/2013   Procedure: LEFT HEART CATHETERIZATION WITH CORONARY ANGIOGRAM;  Surgeon: Laverda Page, MD;  Location: [facility] CATH LAB;  Service: Cardiovascular;; CTO m RCA; otherwise normal coronaries  . LEFT HEART CATHETERIZATION WITH CORONARY ANGIOGRAM N/A 04/01/2013   Procedure: LEFT HEART CATHETERIZATION WITH CORONARY ANGIOGRAM;  Surgeon: Laverda Page, MD;  Location: [facility] CATH LAB;  Service: Cardiovascular: 100% occlusion of distal RCA stent (subacute stent thrombosis -  secondary to stopping Effient).  Overlapping DES PCI  . NM MYOVIEW LTD  12/2015    EF 38%.  Hypertensive response to exercise (231/132 mmHg).  INTERMEDIATE RISK due to -diffuse hypokinesis and reduced EF.  No ischemia or infarction.  Marland Kitchen PERCUTANEOUS CORONARY STENT INTERVENTION (PCI-S)  04/01/2013   Procedure: PERCUTANEOUS CORONARY STENT INTERVENTION (PCI-S);  Surgeon: Laverda Page, MD;  Location: [facility] CATH LAB;  Service: Cardiovascular;; PCI of distal RCA stent subacute thrombosis: Promus  Premier DES 3.0 mm x 20 mm.  Marland Kitchen PERCUTANEOUS CORONARY STENT INTERVENTION (PCI-S)  02/25/2013   Procedure: PERCUTANEOUS CORONARY STENT INTERVENTION (PCI-S);  Surgeon: Laverda Page, MD;  Location: [facility] CATH LAB;  RCA CTO PCI with overlapping Promus DES: 3.0 mm x 38 mm, 3.5 mm x 30mm  . TRANSTHORACIC ECHOCARDIOGRAM  12/2015   mild LVH, EF 55-60%, no RWMA, Gr 1 DD, mild MR  . TRANSTHORACIC ECHOCARDIOGRAM  09/2017   In setting of anterior STEMI:  EF 35% with mild concentric hypertrophy.  GRII DD.  Anteroseptal, anterior and anterolateral walls hypokinetic.  Moderate MR.  . TRANSTHORACIC ECHOCARDIOGRAM  07/04/2018   recurrent Anterior STEMI: EF 35-40%.  Apical hypokinesis.  GRII DD.  No thrombus noted.  (Improved EF from 30% up to 35-40% from  previous echo)  . TUBAL LIGATION  1994     No outpatient medications have been marked as taking for the 01/23/19 encounter (Appointment) with Lendon Colonel, NP.     Allergies:   Patient has no known allergies.   Social History   Tobacco Use  . Smoking status: Current Every Day Smoker    Packs/day: 0.00    Years: 22.00    Pack years: 0.00    Types: Cigarettes  . Smokeless tobacco: Never Used  . Tobacco comment: pt states she smokes about 1-2 cigarettes per day--08/01/18  Substance Use Topics  . Alcohol use: Yes    Alcohol/week: 0.0 standard drinks    Comment: 07/04/2018 "only on special occasions; maybe once/yr"  . Drug use: Yes    Types: Marijuana    Comment: 02/25/2013 "quit marijuana ~ 2001"     Family Hx: The patient's family history includes Diabetes in her mother, sister, sister, son, and son.  ROS:   Please see the history of present illness.    All other systems reviewed and are negative.   Prior CV studies:   The following studies were reviewed today: 12/18/2018 ECHO  1. The left ventricle has severely reduced systolic function, with an ejection fraction of 20-25%. The cavity size was normal. There is moderately increased left ventricular wall thickness. Left ventricular diastolic Doppler parameters are consistent  with pseudonormalization. Elevated mean left atrial pressure Left ventricular diffuse hypokinesis. 2. The right ventricle has normal systolic function. The cavity was normal. There is no increase in right ventricular wall thickness. 3. Left atrial size was severely dilated. 4. No evidence of mitral valve stenosis. 5. The aortic valve is tricuspid. Aortic valve regurgitation was not assessed by color flow Doppler. No stenosis of the aortic valve. 6. The inferior vena cava was dilated in size with <50% respiratory variability.  Cardiac Catheterization 07/03/2018  Previously placed Prox LAD to Mid LAD drug eluting stents are widely patent up  until the distal edge..  Mid LAD lesion is 100% stenosed at the distal edge of the most distal stent.  A drug-eluting stent was successfully placed using a STENT SYNERGY DES 3X12.  Post intervention, there is a 0% residual stenosis.  Balloon angioplasty was performed in the apical vessel just to help move distal embolization -allowing for flow down to the inferoapex.Colon Flattery 2nd Diag lesion is 100% stenosed. Known occluded stent from previous PCI  Prox RCA to Mid RCA stents are 5% stenosed.  Mid RCA to Dist RCA lesion is 40% stenosed.  Ost 2nd Mrg to 2nd Mrg lesion is 65% stenosed.  There is moderate to severe left ventricular systolic dysfunction.  LV end diastolic pressure is  normal.  The left ventricular ejection fraction is 25-35% by visual estimate.  There is trivial (1+) mitral regurgitation.  There is no aortic valve stenosis.  Post intervention, there is a 0% residual stenosis.  Mid LAD lesion is 100% stenosed.   SUMMARY:  Distal stent edge 100% thrombosis (in the mid LAD) as culprit for anterior STEMI: Treated with PTCA and overlapping DES stent (synergy 3.0 mm x 12 mm postdilated to 3.6 mm in the overlap and 3.3 mm distally.  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  (EF roughly 30 to 35%)   Labs/Other Tests and Data Reviewed:    EKG:  No ECG reviewed.  Recent Labs: 12/18/2018: B Natriuretic Peptide 803.2 12/21/2018: Hemoglobin 13.0; Magnesium 2.0; Platelets 418 12/30/2018: ALT 12; BUN 12; Creatinine, Ser 0.84; Potassium 4.5; Sodium 139   Recent Lipid Panel Lab Results  Component Value Date/Time   CHOL 164 07/05/2018 03:30 AM   TRIG 85 12/18/2018 03:18 AM   HDL 39 (L) 07/05/2018 03:30 AM   CHOLHDL 4.2 07/05/2018 03:30 AM   LDLCALC 110 (H) 07/05/2018 03:30 AM    Wt Readings from Last 3 Encounters:  01/16/19 231 lb (104.8  kg)  12/30/18 232 lb 9.6 oz (105.5 kg)  12/22/18 221 lb 11.2 oz (100.6 kg)     Objective:    Vital Signs:  LMP 10/20/2015    VITAL SIGNS:  reviewed GEN:  no acute distress NEURO:  alert and oriented x 3, no obvious focal deficit PSYCH:  normal affect  ASSESSMENT & PLAN:    1. Hypertensive cardiomyopathy: Echocardiogram reveals EF of 20 to 25% on recent hospitalization.  She is currently on Entresto 2426 mg twice daily, Coreg 3.125 mg twice daily, spironolactone 25 mg daily, and Lasix 40 mg daily.  She is not weighing herself daily and I have explained to her the necessity of doing so and the need to take extra doses of diuretic should her weight go up to avoid rehospitalization.  I have also explained in detail that her life expectancy is very short if she were to continue to be fluid overload and hypertensive, less than 5 years.  She verbalizes understanding and stated that no one told her this before.  I have given her refills on her medications, she is asking family members for help to afford the others.  The patient has agreed to weigh herself daily and take her blood pressure daily as directed and reduce salt.  I am going to see her on close follow-up in 1 month to ascertain her status and to draw labs.  2.  Coronary artery disease: Prior stents to the LAD x3 diagonal and RCA.  She continues on aspirin and Brilinta.  She denies rapid heart rhythm or chest discomfort.  3.  Hypertension: Blood pressure is not well controlled based upon her blood pressure reading.  She states she took her blood pressure before taking her medication.  I have asked her to take her blood pressure daily after 1 hour from time she took her medication.  I have explained to her that we would like her blood pressure to be 110/50 or lower in the setting of severely reduced ejection fraction.  She verbalizes understanding.  She is to call us if her blood pressure is over that at which time we will need to increase  Entresto and possibly carvedilol.  I am going to  see her on close follow-up to reassess her status.  May consider sending her to the advanced heart failure clinic for ongoing management.  She understands the necessity of taking care of her self and monitoring weight and blood pressure especially because she has a very young son and she would like to be there to watch him grow up.  4.  Diabetes: We will need to follow-up with primary care for ongoing management.   COVID-19 Education: The signs and symptoms of COVID-19 were discussed with the patient and how to seek care for testing (follow up with PCP or arrange E-visit).  The importance of social distancing was discussed today.  Time:   Today, I have spent 20 minutes with the patient with telehealth technology discussing the above problems.     Medication Adjustments/Labs and Tests Ordered: Current medicines are reviewed at length with the patient today.  Concerns regarding medicines are outlined above.   Tests Ordered: No orders of the defined types were placed in this encounter.   Medication Changes: No orders of the defined types were placed in this encounter.   Disposition:  Follow up 1 month with BMET  Signed, Phill Myron. West Pugh, ANP, AACC  01/22/2019 7:13 PM    [facility]

## 2019-01-22 NOTE — Telephone Encounter (Signed)
I called  pt to confirm her appointment with Kathryn Lawrence on 01-23-19. °

## 2019-01-22 NOTE — Telephone Encounter (Signed)
°  Patient was called and instructed about her visit for 01/23/2019.

## 2019-01-23 ENCOUNTER — Other Ambulatory Visit (HOSPITAL_COMMUNITY)
Admission: RE | Admit: 2019-01-23 | Discharge: 2019-01-23 | Disposition: A | Discharge status: Home / Self Care | Payer: Medicaid Other | Source: Ambulatory Visit | Attending: Obstetrics & Gynecology | Admitting: Obstetrics & Gynecology

## 2019-01-23 ENCOUNTER — Ambulatory Visit (INDEPENDENT_AMBULATORY_CARE_PROVIDER_SITE_OTHER): Payer: Self-pay | Admitting: Obstetrics & Gynecology

## 2019-01-23 ENCOUNTER — Encounter: Payer: Self-pay | Admitting: Obstetrics & Gynecology

## 2019-01-23 ENCOUNTER — Encounter: Payer: Self-pay | Admitting: Adult Health

## 2019-01-23 ENCOUNTER — Telehealth (INDEPENDENT_AMBULATORY_CARE_PROVIDER_SITE_OTHER): Payer: Self-pay | Admitting: Adult Health

## 2019-01-23 ENCOUNTER — Other Ambulatory Visit: Payer: Self-pay

## 2019-01-23 VITALS — BP 127/80 | HR 78 | Temp 98.1°F | Wt 229.3 lb

## 2019-01-23 VITALS — BP 153/101 | HR 64 | Ht 69.0 in | Wt 229.0 lb

## 2019-01-23 DIAGNOSIS — R87610 Atypical squamous cells of undetermined significance on cytologic smear of cervix (ASC-US): Secondary | ICD-10-CM | POA: Diagnosis present

## 2019-01-23 DIAGNOSIS — Z9119 Patient's noncompliance with other medical treatment and regimen: Secondary | ICD-10-CM

## 2019-01-23 DIAGNOSIS — R87810 Cervical high risk human papillomavirus (HPV) DNA test positive: Secondary | ICD-10-CM | POA: Insufficient documentation

## 2019-01-23 DIAGNOSIS — I5022 Chronic systolic (congestive) heart failure: Secondary | ICD-10-CM

## 2019-01-23 DIAGNOSIS — N870 Mild cervical dysplasia: Secondary | ICD-10-CM

## 2019-01-23 DIAGNOSIS — Z8619 Personal history of other infectious and parasitic diseases: Secondary | ICD-10-CM | POA: Diagnosis present

## 2019-01-23 DIAGNOSIS — I25119 Atherosclerotic heart disease of native coronary artery with unspecified angina pectoris: Secondary | ICD-10-CM

## 2019-01-23 DIAGNOSIS — I10 Essential (primary) hypertension: Secondary | ICD-10-CM

## 2019-01-23 DIAGNOSIS — Z91199 Patient's noncompliance with other medical treatment and regimen due to unspecified reason: Secondary | ICD-10-CM

## 2019-01-23 DIAGNOSIS — A5901 Trichomonal vulvovaginitis: Secondary | ICD-10-CM

## 2019-01-23 LAB — POCT PREGNANCY, URINE: Preg Test, Ur: NEGATIVE

## 2019-01-23 NOTE — Patient Instructions (Addendum)
Medication Instructions:  The current medical regimen is effective;  continue present plan and medications as directed. Please refer to the Current Medication list given to you today. If you need a refill on your cardiac medications before your next appointment, please call your pharmacy.  Special Instructions: PLEASE WEIGH DAILY AND LOG YOUR WEIGHT DAILY.  PLEASE FOLLOW LOW SODIUM DIET (ATTACHED)  Follow-Up: You will need a follow up appointment in 1 months. You may see Glenetta Hew, MD or one of the following Advanced Practice Providers on your designated Care Team:  Rosaria Ferries, PA-C  Jory Sims, DNP, ANP    At [facility], you and your health needs are our priority.  As part of our continuing mission to provide you with exceptional heart care, we have created designated Provider Care Teams.  These Care Teams include your primary Cardiologist (physician) and Advanced Practice Providers (APPs -  Physician Assistants and Nurse Practitioners) who all work together to provide you with the care you need, when you need it.  Thank you for choosing [facility] at [facility]!!      Low-Sodium Eating Plan Sodium, which is an element that makes up salt, helps you maintain a healthy balance of fluids in your body. Too much sodium can increase your blood pressure and cause fluid and waste to be held in your body. Your health care provider or dietitian may recommend following this plan if you have high blood pressure (hypertension), kidney disease, liver disease, or heart failure. Eating less sodium can help lower your blood pressure, reduce swelling, and protect your heart, liver, and kidneys. What are tips for following this plan? General guidelines  Most people on this plan should limit their sodium intake to 1,500-2,000 mg (milligrams) of sodium each day. Reading food labels   The Nutrition Facts label lists the amount of sodium in one serving of the food. If you eat more than one  serving, you must multiply the listed amount of sodium by the number of servings.  Choose foods with less than 140 mg of sodium per serving.  Avoid foods with 300 mg of sodium or more per serving. Shopping  Look for lower-sodium products, often labeled as "low-sodium" or "no salt added."  Always check the sodium content even if foods are labeled as "unsalted" or "no salt added".  Buy fresh foods. ? Avoid canned foods and premade or frozen meals. ? Avoid canned, cured, or processed meats  Buy breads that have less than 80 mg of sodium per slice. Cooking  Eat more home-cooked food and less restaurant, buffet, and fast food.  Avoid adding salt when cooking. Use salt-free seasonings or herbs instead of table salt or sea salt. Check with your health care provider or pharmacist before using salt substitutes.  Cook with plant-based oils, such as canola, sunflower, or olive oil. Meal planning  When eating at a restaurant, ask that your food be prepared with less salt or no salt, if possible.  Avoid foods that contain MSG (monosodium glutamate). MSG is sometimes added to [place] food, bouillon, and some canned foods. What foods are recommended? The items listed may not be a complete list. Talk with your dietitian about what dietary choices are best for you. Grains Low-sodium cereals, including oats, puffed wheat and rice, and shredded wheat. Low-sodium crackers. Unsalted rice. Unsalted pasta. Low-sodium bread. Whole-grain breads and whole-grain pasta. Vegetables Fresh or frozen vegetables. "No salt added" canned vegetables. "No salt added" tomato sauce and paste. Low-sodium or reduced-sodium tomato and  vegetable juice. Fruits Fresh, frozen, or canned fruit. Fruit juice. Meats and other protein foods Fresh or frozen (no salt added) meat, poultry, seafood, and fish. Low-sodium canned tuna and salmon. Unsalted nuts. Dried peas, beans, and lentils without added salt. Unsalted canned beans.  Eggs. Unsalted nut butters. Dairy Milk. Soy milk. Cheese that is naturally low in sodium, such as ricotta cheese, fresh mozzarella, or Swiss cheese Low-sodium or reduced-sodium cheese. Cream cheese. Yogurt. Fats and oils Unsalted butter. Unsalted margarine with no trans fat. Vegetable oils such as canola or olive oils. Seasonings and other foods Fresh and dried herbs and spices. Salt-free seasonings. Low-sodium mustard and ketchup. Sodium-free salad dressing. Sodium-free light mayonnaise. Fresh or refrigerated horseradish. Lemon juice. Vinegar. Homemade, reduced-sodium, or low-sodium soups. Unsalted popcorn and pretzels. Low-salt or salt-free chips. What foods are not recommended? The items listed may not be a complete list. Talk with your dietitian about what dietary choices are best for you. Grains Instant hot cereals. Bread stuffing, pancake, and biscuit mixes. Croutons. Seasoned rice or pasta mixes. Noodle soup cups. Boxed or frozen macaroni and cheese. Regular salted crackers. Self-rising flour. Vegetables Sauerkraut, pickled vegetables, and relishes. Olives. [place] fries. Onion rings. Regular canned vegetables (not low-sodium or reduced-sodium). Regular canned tomato sauce and paste (not low-sodium or reduced-sodium). Regular tomato and vegetable juice (not low-sodium or reduced-sodium). Frozen vegetables in sauces. Meats and other protein foods Meat or fish that is salted, canned, smoked, spiced, or pickled. Bacon, ham, sausage, hotdogs, corned beef, chipped beef, packaged lunch meats, salt pork, jerky, pickled herring, anchovies, regular canned tuna, sardines, salted nuts. Dairy Processed cheese and cheese spreads. Cheese curds. Blue cheese. Feta cheese. String cheese. Regular cottage cheese. Buttermilk. Canned milk. Fats and oils Salted butter. Regular margarine. Ghee. Bacon fat. Seasonings and other foods Onion salt, garlic salt, seasoned salt, table salt, and sea salt. Canned and  packaged gravies. Worcestershire sauce. Tartar sauce. Barbecue sauce. Teriyaki sauce. Soy sauce, including reduced-sodium. Steak sauce. Fish sauce. Oyster sauce. Cocktail sauce. Horseradish that you find on the shelf. Regular ketchup and mustard. Meat flavorings and tenderizers. Bouillon cubes. Hot sauce and Tabasco sauce. Premade or packaged marinades. Premade or packaged taco seasonings. Relishes. Regular salad dressings. Salsa. Potato and tortilla chips. Corn chips and puffs. Salted popcorn and pretzels. Canned or dried soups. Pizza. Frozen entrees and pot pies. Summary  Eating less sodium can help lower your blood pressure, reduce swelling, and protect your heart, liver, and kidneys.  Most people on this plan should limit their sodium intake to 1,500-2,000 mg (milligrams) of sodium each day.  Canned, boxed, and frozen foods are high in sodium. Restaurant foods, fast foods, and pizza are also very high in sodium. You also get sodium by adding salt to food.  Try to cook at home, eat more fresh fruits and vegetables, and eat less fast food, canned, processed, or prepared foods. This information is not intended to replace advice given to you by your health care provider. Make sure you discuss any questions you have with your health care provider. Document Released: 01/06/2002 Document Revised: 07/10/2016 Document Reviewed: 07/10/2016 Elsevier Interactive Patient Education  2019 [employer] Heart-healthy meal planning includes:  Eating less unhealthy fats.  Eating more healthy fats.  Making other changes in your diet. Talk with your doctor or a diet specialist (dietitian) to create an eating plan that is right for you.  What are tips for following this plan? Cooking Avoid frying your food. Try to bake,  boil, grill, or broil it instead. You can also reduce fat by:  Removing the skin from poultry.  Removing all visible fats from meats.  Steaming vegetables  in water or broth. Meal planning   At meals, divide your plate into four equal parts: ? Fill one-half of your plate with vegetables and green salads. ? Fill one-fourth of your plate with whole grains. ? Fill one-fourth of your plate with lean protein foods.  Eat 4-5 servings of vegetables per day. A serving of vegetables is: ? 1 cup of raw or cooked vegetables. ? 2 cups of raw leafy greens.  Eat 4-5 servings of fruit per day. A serving of fruit is: ? 1 medium whole fruit. ?  cup of dried fruit. ?  cup of fresh, frozen, or canned fruit. ?  cup of 100% fruit juice.  Eat more foods that have soluble fiber. These are apples, broccoli, carrots, beans, peas, and barley. Try to get 20-30 g of fiber per day.  Eat 4-5 servings of nuts, legumes, and seeds per week: ? 1 serving of dried beans or legumes equals  cup after being cooked. ? 1 serving of nuts is  cup. ? 1 serving of seeds equals 1 tablespoon. General information  Eat more home-cooked food. Eat less restaurant, buffet, and fast food.  Limit or avoid alcohol.  Limit foods that are high in starch and sugar.  Avoid fried foods.  Lose weight if you are overweight.  Keep track of how much salt (sodium) you eat. This is important if you have high blood pressure. Ask your doctor to tell you more about this.  Try to add vegetarian meals each week. Fats  Choose healthy fats. These include olive oil and canola oil, flaxseeds, walnuts, almonds, and seeds.  Eat more omega-3 fats. These include salmon, mackerel, sardines, tuna, flaxseed oil, and ground flaxseeds. Try to eat fish at least 2 times each week.  Check food labels. Avoid foods with trans fats or high amounts of saturated fat.  Limit saturated fats. ? These are often found in animal products, such as meats, butter, and cream. ? These are also found in plant foods, such as palm oil, palm kernel oil, and coconut oil.  Avoid foods with partially hydrogenated oils in  them. These have trans fats. Examples are stick margarine, some tub margarines, cookies, crackers, and other baked goods. What foods can I eat? Fruits All fresh, canned (in natural juice), or frozen fruits. Vegetables Fresh or frozen vegetables (raw, steamed, roasted, or grilled). Green salads. Grains Most grains. Choose whole wheat and whole grains most of the time. Rice and pasta, including brown rice and pastas made with whole wheat. Meats and other proteins Lean, well-trimmed beef, veal, pork, and lamb. Chicken and [place] without skin. All fish and shellfish. Wild duck, rabbit, pheasant, and venison. Egg whites or low-cholesterol egg substitutes. Dried beans, peas, lentils, and tofu. Seeds and most nuts. Dairy Low-fat or nonfat cheeses, including ricotta and mozzarella. Skim or 1% milk that is liquid, powdered, or evaporated. Buttermilk that is made with low-fat milk. Nonfat or low-fat yogurt. Fats and oils Non-hydrogenated (trans-free) margarines. Vegetable oils, including soybean, sesame, sunflower, olive, peanut, safflower, corn, canola, and cottonseed. Salad dressings or mayonnaise made with a vegetable oil. Beverages Mineral water. Coffee and tea. Diet carbonated beverages. Sweets and desserts Sherbet, gelatin, and fruit ice. Small amounts of dark chocolate. Limit all sweets and desserts. Seasonings and condiments All seasonings and condiments. The items listed above may not  be a complete list of foods and drinks you can eat. Contact a dietitian for more options. What foods should I avoid? Fruits Canned fruit in heavy syrup. Fruit in cream or butter sauce. Fried fruit. Limit coconut. Vegetables Vegetables cooked in cheese, cream, or butter sauce. Fried vegetables. Grains Breads that are made with saturated or trans fats, oils, or whole milk. Croissants. Sweet rolls. Donuts. High-fat crackers, such as cheese crackers. Meats and other proteins Fatty meats, such as hot dogs, ribs,  sausage, bacon, rib-eye roast or steak. High-fat deli meats, such as salami and bologna. Caviar. Domestic duck and goose. Organ meats, such as liver. Dairy Cream, sour cream, cream cheese, and creamed cottage cheese. Whole-milk cheeses. Whole or 2% milk that is liquid, evaporated, or condensed. Whole buttermilk. Cream sauce or high-fat cheese sauce. Yogurt that is made from whole milk. Fats and oils Meat fat, or shortening. Cocoa butter, hydrogenated oils, palm oil, coconut oil, palm kernel oil. Solid fats and shortenings, including bacon fat, salt pork, lard, and butter. Nondairy cream substitutes. Salad dressings with cheese or sour cream. Beverages Regular sodas and juice drinks with added sugar. Sweets and desserts Frosting. Pudding. Cookies. Cakes. Pies. Milk chocolate or white chocolate. Buttered syrups. Full-fat ice cream or ice cream drinks. The items listed above may not be a complete list of foods and drinks to avoid. Contact a dietitian for more information. Summary  Heart-healthy meal planning includes eating less unhealthy fats, eating more healthy fats, and making other changes in your diet.  Eat a balanced diet. This includes fruits and vegetables, low-fat or nonfat dairy, lean protein, nuts and legumes, whole grains, and heart-healthy oils and fats. This information is not intended to replace advice given to you by your health care provider. Make sure you discuss any questions you have with your health care provider. Document Released: 01/16/2012 Document Revised: 08/24/2017 Document Reviewed: 08/24/2017 Elsevier Interactive Patient Education  2019 [employer].

## 2019-01-23 NOTE — Progress Notes (Signed)
    GYNECOLOGY CLINIC COLPOSCOPY PROCEDURE NOTE  48 y.o. [id number] here for colposcopy for ASCUS with POSITIVE high risk HPV pap smear on 08/21/2018. Discussed role for HPV in cervical dysplasia, need for surveillance. Also had positive trichomonas on the pap, was treated, has not had test of cure.  Patient given informed consent, signed copy in the chart, time out was performed.  Placed in lithotomy position. Cervix viewed with speculum and colposcope after application of acetic acid.   Colposcopy adequate? Yes Possible acetowhite lesion(s) noted at 11 o'clock; corresponding biopsy obtained.  ECC specimen obtained. All specimens were labeled and sent to pathology. Cervicovaginal ancillary testing done on vaginal discharge; did not appear concerning on visual evaluation.  Patient was given post procedure instructions.  Will follow up pathology and manage accordingly; patient will be contacted with results and recommendations. Comprehensive STI screen also done today given history, will follow up results and manage accordingly.  Routine preventative health maintenance measures emphasized.    Verita Schneiders, MD, [place] for [employer], [place]

## 2019-01-23 NOTE — Addendum Note (Signed)
Addended by: Verita Schneiders A on: 01/23/2019 02:37 PM   Modules accepted: Level of Service

## 2019-01-23 NOTE — Patient Instructions (Signed)
Return to clinic for any scheduled appointments or for any gynecologic concerns as needed.   COLPOSCOPY POST-PROCEDURE INSTRUCTIONS  1. You may take Ibuprofen, Aleve or Tylenol for cramping if needed.  2. If Monsel's solution was used, you will have a black discharge.  3. Light bleeding is normal.  If bleeding is heavier than your period, please call.  4. Put nothing in your vagina until the bleeding or discharge stops (usually 2 or 3 days).  5. We will call you within one week with biopsy results or discuss the results at your follow-up appointment if needed.

## 2019-01-24 ENCOUNTER — Telehealth: Payer: Self-pay

## 2019-01-24 LAB — HEPATITIS C ANTIBODY: Hep C Virus Ab: <0.1 s/co ratio (ref 0.0–0.9)

## 2019-01-24 LAB — RPR: RPR Ser Ql: NONREACTIVE

## 2019-01-24 LAB — CERVICOVAGINAL ANCILLARY ONLY
Chlamydia: NEGATIVE
Neisseria Gonorrhea: NEGATIVE
Trichomonas: POSITIVE — AB

## 2019-01-24 LAB — HEPATITIS B SURFACE ANTIGEN: Hepatitis B Surface Ag: NEGATIVE

## 2019-01-24 LAB — HIV ANTIBODY (ROUTINE TESTING W REFLEX): HIV Screen 4th Generation wRfx: NONREACTIVE

## 2019-01-24 NOTE — Telephone Encounter (Signed)
Patient name and DOB has been verified Patient was informed of lab results. Patient had no questions.  

## 2019-01-24 NOTE — Telephone Encounter (Signed)
-----   Message from [name], MD sent at 01/21/2019  6:04 PM EDT ----- Mammogram is negative for malignancy

## 2019-01-27 MED ORDER — METRONIDAZOLE 500 MG PO TABS: 500.0000 mg | ORAL_TABLET | Freq: Two times a day (BID) | ORAL | 0 refills | Status: DC

## 2019-01-27 MED FILL — metroNIDAZOLE 500 MG TABS: 500 | 7 days supply | Qty: 14 | Fill #0

## 2019-01-27 NOTE — Addendum Note (Signed)
Addended by: Verita Schneiders A on: 01/27/2019 10:14 AM   Modules accepted: Orders

## 2019-01-27 NOTE — Progress Notes (Signed)
Lab Result Addendum   Results for orders placed or performed in visit on 01/23/19 (from the past 168 hour(s))  Cervicovaginal ancillary only   Collection Time: 01/23/19 12:00 AM  Result Value Ref Range   Chlamydia Negative    Neisseria gonorrhea Negative    Trichomonas **POSITIVE** (A)   Pregnancy, urine POC   Collection Time: 01/23/19  1:44 PM  Result Value Ref Range   Preg Test, Ur NEGATIVE NEGATIVE  Hepatitis B surface antigen   Collection Time: 01/23/19  2:47 PM  Result Value Ref Range   Hepatitis B Surface Ag Negative Negative  Hepatitis C antibody   Collection Time: 01/23/19  2:47 PM  Result Value Ref Range   Hep C Virus Ab <0.1 0.0 - 0.9 s/co ratio  HIV Antibody (routine testing w rflx)   Collection Time: 01/23/19  2:47 PM  Result Value Ref Range   HIV Screen 4th Generation wRfx Non Reactive Non Reactive  RPR   Collection Time: 01/23/19  2:47 PM  Result Value Ref Range   RPR Ser Ql Non Reactive Non Reactive    Patient has Trichomonas again.  Other STI testing was negative.  She needs to let partner(s) know so the partner(s) can get testing and treatment. Patient and sex partner(s) should abstain from unprotected sexual activity for seven days after everyone receives appropriate treatment.  Metronidazole (seven day course) was prescribed for patient.  Patient will need to return in about 4 weeks after treatment for repeat test of cure. Patient will be called and informed of results and recommendations, and advised to pick up prescription and take as directed.   Verita Schneiders, MD

## 2019-01-28 ENCOUNTER — Telehealth: Payer: Self-pay

## 2019-01-28 NOTE — Telephone Encounter (Addendum)
-----   Message from Osborne Oman, MD sent at 01/27/2019 10:14 AM EDT ----- Patient has Trichomonas again.  Other STI testing was negative.  She needs to let partner(s) know so the partner(s) can get testing and treatment. Patient and sex partner(s) should abstain from unprotected sexual activity for seven days after everyone receives appropriate treatment.  Metronidazole (seven day course) was prescribed for patient.  Patient will need to return in about 4 weeks after treatment for repeat test of cure.  Please call to inform patient of results and recommendations, and advise to pick up prescription and take as directed.  Notified pt provider's recommendation and need for tx.  I explained to the pt that she will take Flagyl 500 mg po bid for 7 days.  I also informed pt that someone from the front office will contact her with a nurse visit to make sure that it is gone in 4 weeks.  Pt verbalized understanding.

## 2019-02-04 ENCOUNTER — Encounter: Payer: Self-pay | Admitting: Obstetrics & Gynecology

## 2019-02-04 ENCOUNTER — Telehealth: Payer: Self-pay | Admitting: *Deleted

## 2019-02-04 ENCOUNTER — Encounter: Payer: Self-pay | Admitting: *Deleted

## 2019-02-04 DIAGNOSIS — N870 Mild cervical dysplasia: Secondary | ICD-10-CM

## 2019-02-04 DIAGNOSIS — R87810 Cervical high risk human papillomavirus (HPV) DNA test positive: Secondary | ICD-10-CM

## 2019-02-04 DIAGNOSIS — R87610 Atypical squamous cells of undetermined significance on cytologic smear of cervix (ASC-US): Secondary | ICD-10-CM

## 2019-02-04 HISTORY — DX: Mild cervical dysplasia: N87.0

## 2019-02-04 NOTE — Telephone Encounter (Signed)
Called pt to inform her of her colposcopy results and recommendations per Dr. Harolyn Rutherford.  Pt verbalized understanding.

## 2019-02-04 NOTE — Telephone Encounter (Signed)
-----   Message from Osborne Oman, MD sent at 02/04/2019  1:15 PM EDT ----- Diagnosis 1. Cervix, biopsy, 11 o'clock - LOW GRADE SQUAMOUS INTRAEPITHELIAL LESION, CIN-I (MILD DYSPLASIA). 2. Endocervix, curettage - FRAGMENTS OF BENIGN TRANSITIONAL ZONE MUCOSA. - THERE IS NO EVIDENCE OF MALIGNANCY. Mild dysplasia seen.  Need to repeat pap and HPV tests in 12 and 24 months. Please call to inform patient of results and recommendations.

## 2019-02-14 ENCOUNTER — Other Ambulatory Visit: Payer: Self-pay

## 2019-02-14 ENCOUNTER — Ambulatory Visit (HOSPITAL_COMMUNITY): Payer: Self-pay | Attending: Nurse Practitioner

## 2019-02-14 DIAGNOSIS — I5022 Chronic systolic (congestive) heart failure: Secondary | ICD-10-CM | POA: Insufficient documentation

## 2019-02-14 HISTORY — PX: TRANSTHORACIC ECHOCARDIOGRAM: SHX275

## 2019-02-14 MED ORDER — PERFLUTREN LIPID MICROSPHERE
1.0000 mL | INTRAVENOUS | Status: AC | PRN
  Administered 2019-02-14: 3 mL via INTRAVENOUS

## 2019-02-21 ENCOUNTER — Telehealth: Payer: Self-pay | Admitting: Adult Health

## 2019-02-21 MED FILL — ?BASAGLAR 100 UNITS/ML KWPE: 100 | 30 days supply | Qty: 9 | Fill #1

## 2019-02-21 MED FILL — ?FUROSEMIDE 80MG TABLET: 80 | 30 days supply | Qty: 15 | Fill #0

## 2019-02-21 MED FILL — ?ATORVASTATIN 40MG TABLET: 40 | 30 days supply | Qty: 60 | Fill #0

## 2019-02-21 MED FILL — ?GLIPIZIDE 10 MG TABLET: 10 | 30 days supply | Qty: 60 | Fill #4

## 2019-02-21 MED FILL — !BRILINTA 90 MG TABLET: 30 days supply | Qty: 60 | Fill #4

## 2019-02-21 MED FILL — TRUE METRIX TEST STRIP: 25 days supply | Qty: 100 | Fill #1

## 2019-02-21 NOTE — Telephone Encounter (Signed)

## 2019-02-23 NOTE — Progress Notes (Signed)
Cardiology Office Note   Date:  02/24/2019   ID:  ORISSA ARREAGA, DOB 29-Jun-1971, MRN [id number]  PCP:  [name], MD  Cardiologist: Dr. Ellyn Hack No chief complaint on file.    History of Present Illness: LACARA DUNSWORTH is a 48 y.o. female who presents for ongoing assessment and management of difficult to control hypertension, chronic systolic CHF, history of medical noncompliance, ischemic cardiomyopathy, CAD with prior stents to the LAD x3, diagonal, and RCA. Other history includes diabetes, morbid obesity, ongoing tobacco abuse, and hyperlipidemia.  The patient on last office visit on 01/23/2019 was medically compliant but was not weighing herself daily as directed.  She was having trouble affording Brilinta, and requested samples and also had family members help her to buy some.  It was noted that her most recent echocardiogram revealed an EF of 20% to 25%, in May 2020.  She was currently on Entresto 2426 mg daily and carvedilol 3.125 twice daily.  She continued on spironolactone 25 mg daily Lasix 40 mg daily.  She was advised on a low-sodium diet, was to be very strict strict concerning taking her medications every day.  She was to obtain a scale in order to weigh herself daily to avoid fluid overload and rehospitalization.  She was to take her blood pressure daily with a goal of her blood pressure to be 110/50 or less in the setting of reduced EF.  She is here on close follow-up in person for evaluation of her weight blood pressure and symptoms.  Mrs. Labree comes today feeling well.  She has been recording her weights, her blood glucose, and her blood pressures which she brings with her.  She has been medically compliant.  Her weight has been stable.  Blood pressure has still been elevated in the 017B to 939Q systolic.  She denies any excessive fatigue or fluid retention.  She denies any dyspnea on exertion or cramping in her legs.  Past Medical History:  Diagnosis Date  . Coronary  artery disease involving native coronary artery of native heart with angina pectoris ([place]) 01/2013   a. s/p PCI of the mid RCA 01/2013 //  b. LHC 9/14: EF 55%, RCA stent 100% occluded, LAD irregularities >>  subacute stent thrombosis, Promus DES PCI distal overlap // c) 09/2017 - Ant STEMI - LAD-D2 PCI - Successful PCI of LAD (3 overlapping DES), unable to restore flow down D2l that was stented as well.  Likely related to downstream dissection, but unable to rewire.  . Daily headache   . Depression   . Dyslipidemia, goal LDL below 70   . History of Doppler ultrasound    carotid bruit >> a. Carotid US 6/17: bilat ICA 1-39%  . Hypertension   . Mild dysplasia of cervix ([facility]) 02/04/2019   Seen after colposcopy on 01/23/19 done for ASCU +HPV pap  > repeat pap and HPV test in 12 and 24 months  . STEMI involving left anterior descending coronary artery ([place]) 09/2017; 06/2018   a) Severe [place] 1, 1, 1 LAD-D2 lesion (complicated by post PTCA dissection/intramural hematoma) -successful extensive PCI of the LAD but unable to maintain patency of the stented D2. b) distal mLAD stent Edge 100% thrombosis --> PTCA & Overlapping DES PCI (Synergy 3 x 12 --> 3.6-3.3 mm). D2 occluded. RCA stents patent, 65 % OM2. EF 30-35%.  Marland Kitchen STEMI involving oth coronary artery of inferior wall ([place]) 03/2013   Secondary to subacute stent thrombosis of RCA stent segment having missed  4 doses of Effient.  . Tobacco abuse 01/11/2016  . Type II diabetes mellitus ([place]) 12/2010   sister and son also diabetic     Past Surgical History:  Procedure Laterality Date  . CHOLECYSTECTOMY  ~ 2000  . CORONARY STENT INTERVENTION N/A 10/10/2017   Procedure: CORONARY STENT INTERVENTION;  Surgeon: Leonie Man, MD;  Location: [facility];  Service: Cardiovascular; LAD PCI: STENT SYNERGY DES 3.5X32 (p),STENT SYNERGY DES 3.5X 8 (m), STENT SYNERGY DES 3.5X28 (d).  D2 PCI: STENT SYNERGY DES 2.5X24.  (UNABL TO REWIRE & EXPAND - TO OF DIAG AT END  OF CASE)  . CORONARY STENT INTERVENTION N/A 07/03/2018   Procedure: CORONARY STENT INTERVENTION;  Surgeon: Leonie Man, MD;  Location: [facility];; PTCA & overlapping DES PCI (overlaps prior stents distally) - Synergy DES 3 x 12 (3.6 mm @ overlap -- 3.3 mm distally)>  . LEFT HEART CATH AND CORONARY ANGIOGRAPHY N/A 10/10/2017   Procedure: LEFT HEART CATH AND CORONARY ANGIOGRAPHY;  Surgeon: Leonie Man, MD;  Location: [facility];  Service: Cardiovascular;  Laterality: N/A; -patent RCA stents with mild in-stent restenosis. [place] 1,1,1 mLD-D2 87% (complicated by dissection/intramural thrombus)  . LEFT HEART CATH AND CORONARY ANGIOGRAPHY N/A 07/03/2018   Procedure: LEFT HEART CATH AND CORONARY ANGIOGRAPHY;  Surgeon: Leonie Man, MD;  Location: [facility];; 100% thrombotic occlusion distal LAD stent -- DES PCI. continued 100% D2 (previously lost). patent RCA stents, 65% OM2.  EF 30-35%.  Marland Kitchen LEFT HEART CATHETERIZATION WITH CORONARY ANGIOGRAM N/A 02/25/2013   Procedure: LEFT HEART CATHETERIZATION WITH CORONARY ANGIOGRAM;  Surgeon: Laverda Page, MD;  Location: [facility];  Service: Cardiovascular;; CTO m RCA; otherwise normal coronaries  . LEFT HEART CATHETERIZATION WITH CORONARY ANGIOGRAM N/A 04/01/2013   Procedure: LEFT HEART CATHETERIZATION WITH CORONARY ANGIOGRAM;  Surgeon: Laverda Page, MD;  Location: [facility] CATH LAB;  Service: Cardiovascular: 100% occlusion of distal RCA stent (subacute stent thrombosis -  secondary to stopping Effient).  Overlapping DES PCI  . NM MYOVIEW LTD  12/2015    EF 38%.  Hypertensive response to exercise (231/132 mmHg).  INTERMEDIATE RISK due to -diffuse hypokinesis and reduced EF.  No ischemia or infarction.  Marland Kitchen PERCUTANEOUS CORONARY STENT INTERVENTION (PCI-S)  04/01/2013   Procedure: PERCUTANEOUS CORONARY STENT INTERVENTION (PCI-S);  Surgeon: Laverda Page, MD;  Location: [facility] CATH LAB;  Service: Cardiovascular;; PCI of distal RCA stent  subacute thrombosis: Promus Premier DES 3.0 mm x 20 mm.  Marland Kitchen PERCUTANEOUS CORONARY STENT INTERVENTION (PCI-S)  02/25/2013   Procedure: PERCUTANEOUS CORONARY STENT INTERVENTION (PCI-S);  Surgeon: Laverda Page, MD;  [facility];  RCA CTO PCI with overlapping Promus DES: 3.0 mm x 38 mm, 3.5 mm x 90m  . TRANSTHORACIC ECHOCARDIOGRAM  12/2015   mild LVH, EF 55-60%, no RWMA, Gr 1 DD, mild MR  . TRANSTHORACIC ECHOCARDIOGRAM  09/2017   In setting of anterior STEMI:  EF 35% with mild concentric hypertrophy.  GRII DD.  Anteroseptal, anterior and anterolateral walls hypokinetic.  Moderate MR.  . TRANSTHORACIC ECHOCARDIOGRAM  07/04/2018   recurrent Anterior STEMI: EF 35-40%.  Apical hypokinesis.  GRII DD.  No thrombus noted.  (Improved EF from 30% up to 35-40% from previous echo)  . TUBAL LIGATION  1994     Current Outpatient Medications  Medication Sig Dispense Refill  . aspirin EC 81 MG tablet Take 1 tablet (81 mg total) by mouth daily. 90 tablet  3  . atorvastatin (LIPITOR) 80 MG tablet Take 1 tablet (80 mg total) by mouth daily. 30 tablet 11  . blood glucose meter kit and supplies Dispense based on patient and insurance preference. Use up to four times daily as directed. (FOR ICD-10 E10.9, E11.9). 1 each 0  . Blood Glucose Monitoring Suppl (TRUE METRIX METER) DEVI 1 kit by Does not apply route 4 (four) times daily. 1 Device 0  . carvedilol (COREG) 3.125 MG tablet Take 1 tablet (3.125 mg total) by mouth 2 (two) times daily. 60 tablet 11  . furosemide (LASIX) 40 MG tablet Take 1 tablet (40 mg total) by mouth daily. 30 tablet 11  . glipiZIDE (GLUCOTROL) 10 MG tablet Take 1 tablet (10 mg total) by mouth 2 (two) times daily before a meal. 180 tablet 1  . glucose blood test strip Use as instructed 100 each 12  . Insulin Glargine (LANTUS SOLOSTAR) 100 UNIT/ML Solostar Pen Inject 30 Units into the skin daily. 30 mL 3  . nitroGLYCERIN (NITROSTAT) 0.4 MG SL tablet Place 1 tablet (0.4 mg total)  under the tongue every 5 (five) minutes as needed for chest pain. Reported on 11/25/2015 25 tablet 3  . sacubitril-valsartan (ENTRESTO) 49-51 MG Take 1 tablet by mouth 2 (two) times daily. 60 tablet 11  . spironolactone (ALDACTONE) 25 MG tablet Take 0.5 tablets (12.5 mg total) by mouth daily. 30 tablet 11  . ticagrelor (BRILINTA) 90 MG TABS tablet Take 1 tablet (90 mg total) by mouth 2 (two) times daily. 60 tablet 11  . TRUEPLUS LANCETS 28G MISC 28 g by Does not apply route 4 (four) times daily. 120 each 2   No current facility-administered medications for this visit.     Allergies:   Patient has no known allergies.    Social History:  The patient  reports that she has been smoking cigarettes. She has been smoking about 0.00 packs per day for the past 22.00 years. She has never used smokeless tobacco. She reports current alcohol use. She reports current drug use. Drug: Marijuana.   Family History:  The patient's family history includes Diabetes in her mother, sister, sister, son, and son.    ROS: All other systems are reviewed and negative. Unless otherwise mentioned in H&P    PHYSICAL EXAM: VS:  BP (!) 155/91   Pulse 77   Ht 5' 9"  (1.753 m)   Wt 229 lb 9.6 oz (104.1 kg)   LMP 10/20/2015   SpO2 100%   BMI 33.91 kg/m  , BMI Body mass index is 33.91 kg/m. GEN: Well nourished, well developed, in no acute distress HEENT: normal Neck: no JVD, carotid bruits, or masses Cardiac:RRR; no murmurs, rubs, or gallops,no edema  Respiratory:  Clear to auscultation bilaterally, normal work of breathing GI: soft, nontender, nondistended, + BS MS: no deformity or atrophy Skin: warm and dry, no rash Neuro:  Strength and sensation are intact Psych: euthymic mood, full affect   EKG: Not completed this office visit.  Recent Labs: 12/18/2018: B Natriuretic Peptide 803.2 12/21/2018: Hemoglobin 13.0; Magnesium 2.0; Platelets 418 12/30/2018: ALT 12; BUN 12; Creatinine, Ser 0.84; Potassium 4.5; Sodium  139    Lipid Panel    Component Value Date/Time   CHOL 164 07/05/2018 0330   TRIG 85 12/18/2018 0318   HDL 39 (L) 07/05/2018 0330   CHOLHDL 4.2 07/05/2018 0330   VLDL 15 07/05/2018 0330   LDLCALC 110 (H) 07/05/2018 0330      Wt Readings from Last  3 Encounters:  02/24/19 229 lb 9.6 oz (104.1 kg)  01/23/19 229 lb 4.8 oz (104 kg)  01/23/19 229 lb (103.9 kg)      Other studies Reviewed: Echocardiogram 47/17/2020   1. The left ventricle has mildly reduced systolic function, with an ejection fraction of 45-50%. The cavity size was normal. There is moderately increased left ventricular wall thickness. Left ventricular diastolic Doppler parameters are consistent with  impaired relaxation. Indeterminate filling pressures The E/e' is 8-15.  2. Severe akinesis of the left ventricular, entire apical segment.  3. The mitral valve is abnormal. Mild thickening of the mitral valve leaflet.  4. The aortic valve is tricuspid. No stenosis of the aortic valve.  5. Left atrial size was moderately dilated.  6. The right ventricle has normal systolic function. The cavity was normal. There is no increase in right ventricular wall thickness.  7. The ascending aorta and aortic root are normal in size and structure.  8. The tricuspid valve is grossly normal.  9. When compared to the prior study: 12/18/2018: LVEF 20-25%.   Cardiac Catheterization 07/03/2018  Previously placed Prox LAD to Mid LAD drug eluting stents are widely patent up until the distal edge..  Mid LAD lesion is 100% stenosed at the distal edge of the most distal stent.  A drug-eluting stent was successfully placed using a STENT SYNERGY DES 3X12.  Post intervention, there is a 0% residual stenosis.  Balloon angioplasty was performed in the apical vessel just to help move distal embolization -allowing for flow down to the inferoapex.Colon Flattery 2nd Diag lesion is 100% stenosed. Known occluded stent from previous PCI  Prox RCA to Mid  RCA stents are 5% stenosed.  Mid RCA to Dist RCA lesion is 40% stenosed.  Ost 2nd Mrg to 2nd Mrg lesion is 65% stenosed.  There is moderate to severe left ventricular systolic dysfunction.  LV end diastolic pressure is normal.  The left ventricular ejection fraction is 25-35% by visual estimate.  There is trivial (1+) mitral regurgitation.  There is no aortic valve stenosis.  Post intervention, there is a 0% residual stenosis.  Mid LAD lesion is 100% stenosed.  SUMMARY:  Distal stent edge 100% thrombosis (in the mid LAD) as culprit for anterior STEMI: Treated with PTCA and overlapping DES stent (synergy 3.0 mm x 12 mm postdilated to 3.6 mm in the overlap and 3.3 mm distally.  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 (EF roughly 30 to 35%)   ASSESSMENT AND PLAN:  1.  Chronic systolic dysfunction with ischemic cardiomyopathy: The patient has had a repeat echocardiogram repeated on 02/14/2019 with improvement in LV systolic function from 93% to 30% now increased to 45 to 50%.  I will increase her Entresto to 4951 mg and give her samples in order to improve blood pressure control and continue to improve LV systolic function.  I am encouraged by the improvement in her systolic function under current medication regimen.  She does not have any evidence of fluid overload.  She will continue on Lasix 40 mg daily.  Recent labs in June 2020 were normal.  Creatinine 0.84.  Potassium 4.5.  2.  Hypertension: Blood pressure is not well controlled today.  I am going to increase her Entresto as discussed above.  In a patient with CAD and diabetes would prefer blood pressure to be in the 716R over 60 systolic.  I have given her samples of increased dose of Entresto.  We will see her again in 3 months with follow-up BMET.  3.  Insulin-dependent diabetes: Blood  sugars have not been completely controlled with ranges per her own recordings in the 150s to 180s.  She will follow-up with PCP for ongoing assessment.  Will get hemoglobin A1c on follow-up labs.  4.  Hypercholesterolemia: She remains on atorvastatin 80 mg daily.  Will check fasting lipids and LFTs with a goal of LDL less than 70.  Most recent LDL reviewed on 07/05/2018 was 110.  We will follow-up with labs as discussed.   Current medicines are reviewed at length with the patient today.    Labs/ tests ordered today include: BMET, fasting lipids and LFTs, hemoglobin A1c (3 months)  Phill Myron. West Pugh, ANP, AACC   02/24/2019 9:53 AM    [facility] [address] Office [phone] Fax [phone]

## 2019-02-24 ENCOUNTER — Other Ambulatory Visit: Payer: Self-pay

## 2019-02-24 ENCOUNTER — Encounter: Payer: Self-pay | Admitting: Adult Health

## 2019-02-24 ENCOUNTER — Ambulatory Visit (INDEPENDENT_AMBULATORY_CARE_PROVIDER_SITE_OTHER): Payer: Self-pay | Admitting: Adult Health

## 2019-02-24 VITALS — BP 155/91 | HR 77 | Ht 69.0 in | Wt 229.6 lb

## 2019-02-24 DIAGNOSIS — E785 Hyperlipidemia, unspecified: Secondary | ICD-10-CM

## 2019-02-24 DIAGNOSIS — I2510 Atherosclerotic heart disease of native coronary artery without angina pectoris: Secondary | ICD-10-CM

## 2019-02-24 DIAGNOSIS — I5042 Chronic combined systolic (congestive) and diastolic (congestive) heart failure: Secondary | ICD-10-CM

## 2019-02-24 DIAGNOSIS — E11649 Type 2 diabetes mellitus with hypoglycemia without coma: Secondary | ICD-10-CM

## 2019-02-24 DIAGNOSIS — E1169 Type 2 diabetes mellitus with other specified complication: Secondary | ICD-10-CM

## 2019-02-24 DIAGNOSIS — I110 Hypertensive heart disease with heart failure: Secondary | ICD-10-CM

## 2019-02-24 DIAGNOSIS — Z79899 Other long term (current) drug therapy: Secondary | ICD-10-CM

## 2019-02-24 MED ORDER — ENTRESTO 49-51 MG PO TABS: 1.0000 | ORAL_TABLET | Freq: Two times a day (BID) | ORAL | 11 refills | Status: DC

## 2019-02-24 MED ORDER — ATORVASTATIN CALCIUM 80 MG PO TABS: 80.0000 mg | ORAL_TABLET | Freq: Every day | ORAL | 11 refills | Status: DC

## 2019-02-24 MED ORDER — TICAGRELOR 90 MG PO TABS: 90.0000 mg | ORAL_TABLET | Freq: Two times a day (BID) | ORAL | 11 refills | Status: DC

## 2019-02-24 MED ORDER — CARVEDILOL 3.125 MG PO TABS: 3.1250 mg | ORAL_TABLET | Freq: Two times a day (BID) | ORAL | 11 refills | Status: DC

## 2019-02-24 MED ORDER — FUROSEMIDE 40 MG PO TABS: 40.0000 mg | ORAL_TABLET | Freq: Every day | ORAL | 11 refills | Status: DC

## 2019-02-24 MED FILL — ENTRESTO 49 MG-51 MG TABLET: 49-51 | 30 days supply | Qty: 60 | Fill #0

## 2019-02-24 MED FILL — ?CARVEDILOL 3.125 MG TABLET: 3.125 | 30 days supply | Qty: 60 | Fill #0

## 2019-02-24 NOTE — Patient Instructions (Signed)
Medication Instructions:  INCREASE- Entresto 49/51 mg twice a day  If you need a refill on your cardiac medications before your next appointment, please call your pharmacy.  Labwork: Fasting Lipid Liver BMP and HgB A1c HERE IN OUR OFFICE AT [facility]  You will need to fast. DO NOT EAT OR DRINK PAST MIDNIGHT.     You will NOT need to fast   Take the provided lab slips with you to the lab for your blood draw.   When you have your labs (blood work) drawn today and your tests are completely normal, you will receive your results only by MyChart Message (if you have MyChart) -OR-  A paper copy in the mail.  If you have any lab test that is abnormal or we need to change your treatment, we will call you to review these results.  Testing/Procedures: None Ordered  Follow-Up: You will need a follow up appointment in 3 months.  Please call our office 2 months in advance to schedule this appointment.  You may see Glenetta Hew, MD or one of the following Advanced Practice Providers on your designated Care Team:   Rosaria Ferries, PA-C . Jory Sims, DNP, ANP     At [facility], you and your health needs are our priority.  As part of our continuing mission to provide you with exceptional heart care, we have created designated Provider Care Teams.  These Care Teams include your primary Cardiologist (physician) and Advanced Practice Providers (APPs -  Physician Assistants and Nurse Practitioners) who all work together to provide you with the care you need, when you need it.  Thank you for choosing [facility] at [facility]!!

## 2019-02-25 ENCOUNTER — Ambulatory Visit (INDEPENDENT_AMBULATORY_CARE_PROVIDER_SITE_OTHER): Payer: Self-pay | Admitting: General Practice

## 2019-02-25 DIAGNOSIS — Z712 Person consulting for explanation of examination or test findings: Secondary | ICD-10-CM

## 2019-02-25 DIAGNOSIS — A599 Trichomoniasis, unspecified: Secondary | ICD-10-CM

## 2019-02-25 MED ORDER — TINIDAZOLE 500 MG PO TABS: 2.0000 g | ORAL_TABLET | Freq: Every day | ORAL | 0 refills | Status: AC

## 2019-02-25 MED FILL — TINIDAZOLE 500 MG TABS: 500 | 1 days supply | Qty: 4 | Fill #0

## 2019-02-25 NOTE — Progress Notes (Signed)
Patient presents to office today for test of cure following + trichomonas 6/25. Patient states she thinks she still has the infection because she threw up the pills. Patient reports taking Rx as prescribed twice a day for a week but she got sick every time, even when taken with food. Tindamax prescribed per protocol as alternative. Told patient if she gets sick again to call and let us know. Patient will return to office in 4 weeks for test of cure.  Koren Bound RN BSN 02/25/19

## 2019-02-26 ENCOUNTER — Encounter: Payer: Self-pay | Admitting: Family Medicine

## 2019-02-26 ENCOUNTER — Ambulatory Visit: Payer: Self-pay | Attending: Family Medicine | Admitting: Family Medicine

## 2019-02-26 ENCOUNTER — Telehealth: Payer: Medicaid Other | Admitting: Cardiology

## 2019-02-26 ENCOUNTER — Other Ambulatory Visit: Payer: Self-pay

## 2019-02-26 VITALS — BP 110/70 | HR 77 | Temp 98.0°F | Ht 69.0 in | Wt 231.0 lb

## 2019-02-26 DIAGNOSIS — E1165 Type 2 diabetes mellitus with hyperglycemia: Secondary | ICD-10-CM

## 2019-02-26 DIAGNOSIS — Z634 Disappearance and death of family member: Secondary | ICD-10-CM

## 2019-02-26 DIAGNOSIS — I10 Essential (primary) hypertension: Secondary | ICD-10-CM

## 2019-02-26 DIAGNOSIS — E1149 Type 2 diabetes mellitus with other diabetic neurological complication: Secondary | ICD-10-CM

## 2019-02-26 LAB — GLUCOSE, POCT (MANUAL RESULT ENTRY)
POC Glucose: 402 mg/dl — AB (ref 70–99)
POC Glucose: 454 mg/dL — AB (ref 70–99)

## 2019-02-26 MED ORDER — GABAPENTIN 300 MG PO CAPS: 300.0000 mg | ORAL_CAPSULE | Freq: Every day | ORAL | 3 refills | Status: DC

## 2019-02-26 MED ORDER — INSULIN ASPART 100 UNIT/ML ~~LOC~~ SOLN
10.0000 [IU] | Freq: Once | SUBCUTANEOUS | Status: AC
  Administered 2019-02-26: 12:00:00 10 [IU] via SUBCUTANEOUS

## 2019-02-26 MED ORDER — LANTUS SOLOSTAR 100 UNIT/ML ~~LOC~~ SOPN: 30.0000 [IU] | PEN_INJECTOR | Freq: Every day | SUBCUTANEOUS | 3 refills | Status: DC

## 2019-02-26 MED ORDER — INSULIN ASPART 100 UNIT/ML ~~LOC~~ SOLN
10.0000 [IU] | Freq: Once | SUBCUTANEOUS | Status: AC
  Administered 2019-02-26: 10 [IU] via SUBCUTANEOUS

## 2019-02-26 NOTE — Progress Notes (Signed)
Subjective:  Patient ID: Amy Chaney, female    DOB: 1971/03/28  Age: 48 y.o. MRN: [id number]  CC: Diabetes   HPI HUDSON LEHMKUHL is a 48 year old female with a history of type 2 diabetes mellitus (A1c 8.2), hypertension, CHF (EF 45 to 50% which is improved from 20 to 25% in 11/2018 ), CAD (status post PCI to LAD with 3 overlapping DES in 09/2017), last STEMI was in 06/2018,  hospitalization for flash pulmonary edema and Takotsubo cardiomyopathy from 12/18/2018 through 12/22/2018 at [facility] after the loss of her son.  She had an office visit last month with me for follow-up of her chronic medical conditions.  She has not been compliant with Lantus as she is the caregiver of her 50-year-old grandson and is sometimes exhausted and forgets to take her Lantus.  She forgot to take Lantus last night and her blood sugar in the clinic is 402.  NovoLog 20 units administered.  She also endorses sharp pains which are intermittent in toes of both feet.  She had a cardiology visit 2 days ago where her blood pressure was elevated and Entresto dose was increased. Echo from 01/2019 revealed: IMPRESSIONS    1. The left ventricle has mildly reduced systolic function, with an ejection fraction of 45-50%. The cavity size was normal. There is moderately increased left ventricular wall thickness. Left ventricular diastolic Doppler parameters are consistent with  impaired relaxation. Indeterminate filling pressures The E/e' is 8-15.  2. Severe akinesis of the left ventricular, entire apical segment.  3. The mitral valve is abnormal. Mild thickening of the mitral valve leaflet.  4. The aortic valve is tricuspid. No stenosis of the aortic valve.  5. Left atrial size was moderately dilated.  6. The right ventricle has normal systolic function. The cavity was normal. There is no increase in right ventricular wall thickness.  7. The ascending aorta and aortic root are normal in size and structure.  8. The  tricuspid valve is grossly normal.  9. When compared to the prior study: 12/18/2018: LVEF 20-25%.  She is doing better with regards to her bereavement and is taking it one day at a time. Past Medical History:  Diagnosis Date  . Coronary artery disease involving native coronary artery of native heart with angina pectoris ([place]) 01/2013   a. s/p PCI of the mid RCA 01/2013 //  b. LHC 9/14: EF 55%, RCA stent 100% occluded, LAD irregularities >>  subacute stent thrombosis, Promus DES PCI distal overlap // c) 09/2017 - Ant STEMI - LAD-D2 PCI - Successful PCI of LAD (3 overlapping DES), unable to restore flow down D2l that was stented as well.  Likely related to downstream dissection, but unable to rewire.  . Daily headache   . Depression   . Dyslipidemia, goal LDL below 70   . History of Doppler ultrasound    carotid bruit >> a. Carotid US 6/17: bilat ICA 1-39%  . Hypertension   . Mild dysplasia of cervix ([place]) 02/04/2019   Seen after colposcopy on 01/23/19 done for ASCU +HPV pap  > repeat pap and HPV test in 12 and 24 months  . STEMI involving left anterior descending coronary artery ([place]) 09/2017; 06/2018   a) Severe [place] 1, 1, 1 LAD-D2 lesion (complicated by post PTCA dissection/intramural hematoma) -successful extensive PCI of the LAD but unable to maintain patency of the stented D2. b) distal mLAD stent Edge 100% thrombosis --> PTCA & Overlapping DES PCI (Synergy 3 x  12 --> 3.6-3.3 mm). D2 occluded. RCA stents patent, 65 % OM2. EF 30-35%.  Marland Kitchen STEMI involving oth coronary artery of inferior wall ([place]) 03/2013   Secondary to subacute stent thrombosis of RCA stent segment having missed 4 doses of Effient.  . Tobacco abuse 01/11/2016  . Type II diabetes mellitus ([place]) 12/2010   sister and son also diabetic     Past Surgical History:  Procedure Laterality Date  . CHOLECYSTECTOMY  ~ 2000  . CORONARY STENT INTERVENTION N/A 10/10/2017   Procedure: CORONARY STENT INTERVENTION;  Surgeon: Leonie Man, MD;  Location: [place] [facility];  Service: Cardiovascular; LAD PCI: STENT SYNERGY DES 3.5X32 (p),STENT SYNERGY DES 3.5X 8 (m), STENT SYNERGY DES 3.5X28 (d).  D2 PCI: STENT SYNERGY DES 2.5X24.  (UNABL TO REWIRE & EXPAND - TO OF DIAG AT END OF CASE)  . CORONARY STENT INTERVENTION N/A 07/03/2018   Procedure: CORONARY STENT INTERVENTION;  Surgeon: Leonie Man, MD;  Location: [facility];; PTCA & overlapping DES PCI (overlaps prior stents distally) - Synergy DES 3 x 12 (3.6 mm @ overlap -- 3.3 mm distally)>  . LEFT HEART CATH AND CORONARY ANGIOGRAPHY N/A 10/10/2017   Procedure: LEFT HEART CATH AND CORONARY ANGIOGRAPHY;  Surgeon: Leonie Man, MD;  Location: [facility];  Service: Cardiovascular;  Laterality: N/A; -patent RCA stents with mild in-stent restenosis. [place] 1,1,1 mLD-D2 40% (complicated by dissection/intramural thrombus)  . LEFT HEART CATH AND CORONARY ANGIOGRAPHY N/A 07/03/2018   Procedure: LEFT HEART CATH AND CORONARY ANGIOGRAPHY;  Surgeon: Leonie Man, MD;  Location: [facility];; 100% thrombotic occlusion distal LAD stent -- DES PCI. continued 100% D2 (previously lost). patent RCA stents, 65% OM2.  EF 30-35%.  Marland Kitchen LEFT HEART CATHETERIZATION WITH CORONARY ANGIOGRAM N/A 02/25/2013   Procedure: LEFT HEART CATHETERIZATION WITH CORONARY ANGIOGRAM;  Surgeon: Laverda Page, MD;  Location: [facility] CATH LAB;  Service: Cardiovascular;; CTO m RCA; otherwise normal coronaries  . LEFT HEART CATHETERIZATION WITH CORONARY ANGIOGRAM N/A 04/01/2013   Procedure: LEFT HEART CATHETERIZATION WITH CORONARY ANGIOGRAM;  Surgeon: Laverda Page, MD;  Location: [facility] CATH LAB;  Service: Cardiovascular: 100% occlusion of distal RCA stent (subacute stent thrombosis -  secondary to stopping Effient).  Overlapping DES PCI  . NM MYOVIEW LTD  12/2015    EF 38%.  Hypertensive response to exercise (231/132 mmHg).  INTERMEDIATE RISK due to -diffuse hypokinesis and reduced EF.  No ischemia or  infarction.  Marland Kitchen PERCUTANEOUS CORONARY STENT INTERVENTION (PCI-S)  04/01/2013   Procedure: PERCUTANEOUS CORONARY STENT INTERVENTION (PCI-S);  Surgeon: Laverda Page, MD;  Location: [facility] CATH LAB;  Service: Cardiovascular;; PCI of distal RCA stent subacute thrombosis: Promus Premier DES 3.0 mm x 20 mm.  Marland Kitchen PERCUTANEOUS CORONARY STENT INTERVENTION (PCI-S)  02/25/2013   Procedure: PERCUTANEOUS CORONARY STENT INTERVENTION (PCI-S);  Surgeon: Laverda Page, MD;  Location: [facility] CATH LAB;  RCA CTO PCI with overlapping Promus DES: 3.0 mm x 38 mm, 3.5 mm x 32m  . TRANSTHORACIC ECHOCARDIOGRAM  12/2015   mild LVH, EF 55-60%, no RWMA, Gr 1 DD, mild MR  . TRANSTHORACIC ECHOCARDIOGRAM  09/2017   In setting of anterior STEMI:  EF 35% with mild concentric hypertrophy.  GRII DD.  Anteroseptal, anterior and anterolateral walls hypokinetic.  Moderate MR.  . TRANSTHORACIC ECHOCARDIOGRAM  07/04/2018   recurrent Anterior STEMI: EF 35-40%.  Apical hypokinesis.  GRII DD.  No thrombus noted.  (Improved EF from 30% up to 35-40%  from previous echo)  . TUBAL LIGATION  1994    Family History  Problem Relation Age of Onset  . Diabetes Mother   . Diabetes Sister   . Diabetes Sister   . Diabetes Son        type 1  . Diabetes Son        type 2    No Known Allergies  Outpatient Medications Prior to Visit  Medication Sig Dispense Refill  . aspirin EC 81 MG tablet Take 1 tablet (81 mg total) by mouth daily. 90 tablet 3  . atorvastatin (LIPITOR) 80 MG tablet Take 1 tablet (80 mg total) by mouth daily. 30 tablet 11  . blood glucose meter kit and supplies Dispense based on patient and insurance preference. Use up to four times daily as directed. (FOR ICD-10 E10.9, E11.9). 1 each 0  . Blood Glucose Monitoring Suppl (TRUE METRIX METER) DEVI 1 kit by Does not apply route 4 (four) times daily. 1 Device 0  . carvedilol (COREG) 3.125 MG tablet Take 1 tablet (3.125 mg total) by mouth 2 (two) times daily. 60 tablet 11  .  furosemide (LASIX) 40 MG tablet Take 1 tablet (40 mg total) by mouth daily. 30 tablet 11  . glipiZIDE (GLUCOTROL) 10 MG tablet Take 1 tablet (10 mg total) by mouth 2 (two) times daily before a meal. 180 tablet 1  . glucose blood test strip Use as instructed 100 each 12  . nitroGLYCERIN (NITROSTAT) 0.4 MG SL tablet Place 1 tablet (0.4 mg total) under the tongue every 5 (five) minutes as needed for chest pain. Reported on 11/25/2015 25 tablet 3  . sacubitril-valsartan (ENTRESTO) 49-51 MG Take 1 tablet by mouth 2 (two) times daily. 60 tablet 11  . spironolactone (ALDACTONE) 25 MG tablet Take 0.5 tablets (12.5 mg total) by mouth daily. 30 tablet 11  . ticagrelor (BRILINTA) 90 MG TABS tablet Take 1 tablet (90 mg total) by mouth 2 (two) times daily. 60 tablet 11  . tinidazole (TINDAMAX) 500 MG tablet Take 4 tablets (2,000 mg total) by mouth daily with breakfast for 1 dose. 4 tablet 0  . TRUEPLUS LANCETS 28G MISC 28 g by Does not apply route 4 (four) times daily. 120 each 2  . Insulin Glargine (LANTUS SOLOSTAR) 100 UNIT/ML Solostar Pen Inject 30 Units into the skin daily. 30 mL 3   No facility-administered medications prior to visit.      ROS Review of Systems  Constitutional: Negative for activity change, appetite change and fatigue.  HENT: Negative for congestion, sinus pressure and sore throat.   Eyes: Negative for visual disturbance.  Respiratory: Negative for cough, chest tightness, shortness of breath and wheezing.   Cardiovascular: Negative for chest pain and palpitations.  Gastrointestinal: Negative for abdominal distention, abdominal pain and constipation.  Endocrine: Negative for polydipsia.  Genitourinary: Negative for dysuria and frequency.  Musculoskeletal: Negative for arthralgias and back pain.  Skin: Negative for rash.  Neurological: Positive for numbness. Negative for tremors and light-headedness.  Hematological: Does not bruise/bleed easily.  Psychiatric/Behavioral: Negative  for agitation and behavioral problems.    Objective:  BP 110/70   Pulse 77   Temp 98 F (36.7 C) (Oral)   Ht 5' 9"  (1.753 m)   Wt 231 lb (104.8 kg)   LMP 10/20/2015   SpO2 100%   BMI 34.11 kg/m   BP/Weight 02/26/2019 02/24/2019 8/67/6720  Systolic BP 947 096 283  Diastolic BP 70 91 662  Wt. (Lbs) 231 229.6 229  BMI 34.11 33.91 33.82      Physical Exam Constitutional:      Appearance: She is well-developed.  Cardiovascular:     Rate and Rhythm: Normal rate.     Heart sounds: Normal heart sounds. No murmur.  Pulmonary:     Effort: Pulmonary effort is normal.     Breath sounds: Normal breath sounds. No wheezing or rales.  Chest:     Chest wall: No tenderness.  Abdominal:     General: Bowel sounds are normal. There is no distension.     Palpations: Abdomen is soft. There is no mass.     Tenderness: There is no abdominal tenderness.  Musculoskeletal: Normal range of motion.  Neurological:     Mental Status: She is alert and oriented to person, place, and time.   Sensory exam of the foot is normal, tested with the monofilament. Good pulses, no lesions or ulcers, good peripheral pulses.   CMP Latest Ref Rng & Units 12/30/2018 12/22/2018 12/21/2018  Glucose 65 - 99 mg/dL 227(H) 256(H) 184(H)  BUN 6 - 24 mg/dL 12 67(H) 60(H)  Creatinine 0.57 - 1.00 mg/dL 0.84 1.80(H) 2.10(H)  Sodium 134 - 144 mmol/L 139 134(L) 135  Potassium 3.5 - 5.2 mmol/L 4.5 4.0 3.2(L)  Chloride 96 - 106 mmol/L 104 95(L) 94(L)  CO2 20 - 29 mmol/L 22 23 25   Calcium 8.7 - 10.2 mg/dL 9.2 10.1 10.4(H)  Total Protein 6.0 - 8.5 g/dL 7.2 - -  Total Bilirubin 0.0 - 1.2 mg/dL 0.3 - -  Alkaline Phos 39 - 117 IU/L 50 - -  AST 0 - 40 IU/L 9 - -  ALT 0 - 32 IU/L 12 - -    Lipid Panel     Component Value Date/Time   CHOL 164 07/05/2018 0330   TRIG 85 12/18/2018 0318   HDL 39 (L) 07/05/2018 0330   CHOLHDL 4.2 07/05/2018 0330   VLDL 15 07/05/2018 0330   LDLCALC 110 (H) 07/05/2018 0330    CBC     Component Value Date/Time   WBC 13.7 (H) 12/21/2018 0041   RBC 4.97 12/21/2018 0041   HGB 13.0 12/21/2018 0041   HCT 39.9 12/21/2018 0041   PLT 418 (H) 12/21/2018 0041   MCV 80.3 12/21/2018 0041   MCH 26.2 12/21/2018 0041   MCHC 32.6 12/21/2018 0041   RDW 14.7 12/21/2018 0041   LYMPHSABS 8.0 (H) 12/18/2018 0318   MONOABS 1.0 12/18/2018 0318   EOSABS 0.5 12/18/2018 0318   BASOSABS 0.2 (H) 12/18/2018 0318    Lab Results  Component Value Date   HGBA1C 8.2 (H) 12/21/2018    Assessment & Plan:   1. Uncontrolled type 2 diabetes mellitus with hyperglycemia (HCC) Uncontrolled with A1c of 8.2 NovoLog 20 units administered due to hyperglycemia Poor control due to noncompliance We have discussed mechanisms to aid with compliance including administering Lantus right after dinner; no regimen change today Counseled on Diabetic diet, my plate method, 697 minutes of moderate intensity exercise/week Keep blood sugar logs with fasting goals of 80-120 mg/dl, random of less than 180 and in the event of sugars less than 60 mg/dl or greater than 400 mg/dl please notify the clinic ASAP. It is recommended that you undergo annual eye exams and annual foot exams. Pneumonia vaccine is recommended. - Insulin Glargine (LANTUS SOLOSTAR) 100 UNIT/ML Solostar Pen; Inject 30 Units into the skin daily.  Dispense: 30 mL; Refill: 3  2. Other diabetic neurological complication associated with type 2 diabetes mellitus (  [place]) Placed on gabapentin - POCT glucose (manual entry)  3. Bereavement Declined grief counseling  4. Essential hypertension Controlled   Health Care Maintenance: Due for eye exam but lacks medical coverage.  We have discussed available community resources for her eye exam. Meds ordered this encounter  Medications  . gabapentin (NEURONTIN) 300 MG capsule    Sig: Take 1 capsule (300 mg total) by mouth at bedtime.    Dispense:  30 capsule    Refill:  3  . Insulin Glargine (LANTUS  SOLOSTAR) 100 UNIT/ML Solostar Pen    Sig: Inject 30 Units into the skin daily.    Dispense:  30 mL    Refill:  3    Follow-up: Return in about 3 months (around 05/29/2019) for medical conditions.       [name], MD, FAAFP. [facility] and [place], [place]   02/26/2019, 10:36 AM

## 2019-02-27 ENCOUNTER — Ambulatory Visit: Payer: Medicaid Other | Admitting: Family Medicine

## 2019-03-10 ENCOUNTER — Telehealth: Payer: Self-pay | Admitting: Adult Health

## 2019-03-10 NOTE — Telephone Encounter (Signed)
°*  STAT* If patient is at the pharmacy, call can be transferred to refill team.   1. Which medications need to be refilled? (please list name of each medication and dose if known) sacubitril-valsartan (ENTRESTO) 49-51 MG  2. Which pharmacy/location (including street and city if local pharmacy) is medication to be sent to? It goes [facility] ID # [id number]  3. Do they need a 30 day or 90 day supply? 30 or 90 day whichever they can give her.

## 2019-03-10 NOTE — Telephone Encounter (Signed)
Should this go via fax with patient assistance paperwork?  Did not want to just send it without-if needed. Thanks!

## 2019-03-14 MED FILL — ENTRESTO 49 MG-51 MG TABLET: 49-51 | 30 days supply | Qty: 60 | Fill #0

## 2019-03-21 NOTE — Telephone Encounter (Signed)
SPOKE  TO PATIENT - SHE STATES SHE  SPOKE [facility]- SOME ONE SENT  INFORMATION TO [facility]  DUE TO MEDICATION  WAS INCREASED 49/51 MG

## 2019-03-24 ENCOUNTER — Telehealth: Payer: Self-pay | Admitting: Family Medicine

## 2019-03-24 NOTE — Telephone Encounter (Signed)
Spoke to patient about her appointment on 8/25 @ 10:00. Patient instructed to wear a face mask for the entire appointment and no visitors are allowed with her during the visit. Patient screened for covid symptoms and denied having any

## 2019-03-25 ENCOUNTER — Other Ambulatory Visit: Payer: Self-pay

## 2019-03-25 ENCOUNTER — Ambulatory Visit (INDEPENDENT_AMBULATORY_CARE_PROVIDER_SITE_OTHER): Payer: Self-pay

## 2019-03-25 ENCOUNTER — Other Ambulatory Visit (HOSPITAL_COMMUNITY)
Admission: RE | Admit: 2019-03-25 | Discharge: 2019-03-25 | Disposition: A | Discharge status: Home / Self Care | Payer: Medicaid Other | Source: Ambulatory Visit | Attending: Student | Admitting: Student

## 2019-03-25 DIAGNOSIS — Z202 Contact with and (suspected) exposure to infections with a predominantly sexual mode of transmission: Secondary | ICD-10-CM | POA: Insufficient documentation

## 2019-03-25 NOTE — Progress Notes (Signed)
Pt here today for TOC s/p testing positive for Trich.  Pt explained on how to obtain self swag and that we will call with abnormal results.    Mel Almond, RN 03/25/19

## 2019-03-27 ENCOUNTER — Telehealth: Payer: Self-pay | Admitting: Cardiology

## 2019-03-27 LAB — CERVICOVAGINAL ANCILLARY ONLY
Candida vaginitis: NEGATIVE
Chlamydia: NEGATIVE
Neisseria Gonorrhea: NEGATIVE
Trichomonas: POSITIVE — AB

## 2019-03-27 MED FILL — ?FUROSEMIDE 80MG TABLET: 80 | 30 days supply | Qty: 15 | Fill #1

## 2019-03-27 MED FILL — SPIRONOLACTONE 25 MG TABLET: 25 | 30 days supply | Qty: 15 | Fill #1

## 2019-03-27 MED FILL — $BRILINTA 90 MG TABLET: 90 | 90 days supply | Qty: 180 | Fill #5

## 2019-03-27 NOTE — Telephone Encounter (Signed)
  Patient calling the office for samples of medication:   1.  What medication and dosage are you requesting samples for? ticagrelor (BRILINTA) 90 MG TABS tablet  2.  Are you currently out of this medication? almost

## 2019-03-27 NOTE — Telephone Encounter (Signed)
Informed pt office is currently out of samples. Pt voiced understanding.

## 2019-03-29 ENCOUNTER — Other Ambulatory Visit: Payer: Self-pay | Admitting: Student

## 2019-03-29 MED ORDER — METRONIDAZOLE 500 MG PO TABS: 2000.0000 mg | ORAL_TABLET | Freq: Once | ORAL | 0 refills | Status: AC

## 2019-03-30 ENCOUNTER — Other Ambulatory Visit: Payer: Self-pay | Admitting: Student

## 2019-03-30 DIAGNOSIS — A599 Trichomoniasis, unspecified: Secondary | ICD-10-CM | POA: Insufficient documentation

## 2019-03-30 MED ORDER — METRONIDAZOLE 500 MG PO TABS: 500.0000 mg | ORAL_TABLET | Freq: Two times a day (BID) | ORAL | 0 refills | Status: DC

## 2019-03-30 NOTE — Progress Notes (Signed)
I reviewed the note and agree with the nursing assessment and plan.   Haley Fuerstenberg, CNM 10/26/2017 10:32 AM   

## 2019-03-31 ENCOUNTER — Telehealth: Payer: Self-pay | Admitting: *Deleted

## 2019-03-31 DIAGNOSIS — N760 Acute vaginitis: Secondary | ICD-10-CM

## 2019-03-31 DIAGNOSIS — A599 Trichomoniasis, unspecified: Secondary | ICD-10-CM

## 2019-03-31 DIAGNOSIS — B9689 Other specified bacterial agents as the cause of diseases classified elsewhere: Secondary | ICD-10-CM

## 2019-03-31 MED FILL — ?METRONIDAZOLE 500 MG TABS: 500 | 7 days supply | Qty: 14 | Fill #0

## 2019-03-31 NOTE — Telephone Encounter (Signed)
I called Amy Chaney and informed her that her wet prep did show BV and trich. I explained flagyl will treat both and has been sent to her pharmacy. I explained trich is STD and her partner should also be treated by their doctor or go to the health department and they should abstain until 1- 2 weeks after both treated. She states she has not had sex since last June a year ago.she states she was treated for trich before.  I explained if she was treated before for trich perhaps it was not effective. I also explained Curt Bears wants her to have a nurse visit for a self swab 4 weeks after she takes her medication to check and be sure it is resolved. She states she cannot take flagyl- she throws it up; even if she takes it with food. I explained I will check with Curt Bears to see what other med we can try and we will get back to her.  Amour Cutrone,RN

## 2019-03-31 NOTE — Telephone Encounter (Signed)
-----   Message from [place], [place] sent at 03/30/2019  2:52 AM EDT ----- Patient has Trich and BV. I gave her a 7 day course of flagyl, which will treat both Trich and BV. Her partner needs to get treated again; he can go to the health department. She needs to abstain form sex for 7 days after her partner is treated.  She should come back in 4 weeks after finishing the Flagyl for self swab to see if it is gone. If she continues to have trich, even after she is treated, we should bring her back in to talk about a different medicine to try.Thank you! Maye Hides

## 2019-04-01 MED FILL — ?CARVEDILOL 3.125 MG TABLET: 3.125 | 30 days supply | Qty: 60 | Fill #0

## 2019-04-02 MED ORDER — TINIDAZOLE 500 MG PO TABS: 2.0000 g | ORAL_TABLET | Freq: Every day | ORAL | 0 refills | Status: AC

## 2019-04-02 MED FILL — TINIDAZOLE 500 MG TABS: 500 | 2 days supply | Qty: 8 | Fill #0

## 2019-04-02 NOTE — Telephone Encounter (Signed)
I called Amy Chaney and informed her we are sending in RX for Tindamax 2 gm po x 2 days. Advised to take with food. Offered to have registrars call her with 4 weeks nurse visit for self swab for TOC; she declined- states she will call for appointment.  Linda,RN

## 2019-04-02 NOTE — Telephone Encounter (Signed)
Per Maye Hides, CNM may have Tindamax 2 gm po daily x 2 days to treat BV and trich.

## 2019-04-03 ENCOUNTER — Telehealth: Payer: Self-pay | Admitting: Cardiology

## 2019-04-03 NOTE — Telephone Encounter (Signed)
New Message    Pt c/o medication issue:  1. Name of Medication: Entresto  2. How are you currently taking this medication (dosage and times per day)?  49-51mg  1 tablet my mouth 2 times a day  3. Are you having a reaction (difficulty breathing--STAT)? No  4. What is your medication issue? Patient want's to speak to a nurse about getting the medication for free.  Please call back to discuss.

## 2019-04-03 NOTE — Telephone Encounter (Signed)
Patient calling for information on patient assistance. Thank you!

## 2019-04-10 ENCOUNTER — Other Ambulatory Visit: Payer: Self-pay | Admitting: Cardiology

## 2019-04-10 MED ORDER — ENTRESTO 49-51 MG PO TABS: 1.0000 | ORAL_TABLET | Freq: Two times a day (BID) | ORAL | 11 refills | Status: DC

## 2019-04-10 MED FILL — **ENTRESTO 49-51 MG TABLET: 49-51 | 14 days supply | Qty: 28 | Fill #1

## 2019-04-10 MED FILL — ?GLIPIZIDE 10 MG TABLET: 10 | 30 days supply | Qty: 60 | Fill #5

## 2019-04-10 NOTE — Telephone Encounter (Signed)
New message:    Patient calling concering a refill. Not sure if she need a refill or did they changed the dosage. Please call patient back.

## 2019-04-10 NOTE — Telephone Encounter (Signed)
Contacted pt who requests that her Entresto refill be sent to a different pharmacy: [facility]. Informed pt that refill Rx has been sent to the pharmacy. Pt verbalized understanding

## 2019-04-11 NOTE — Telephone Encounter (Signed)
RECEIVED  INFORMATION TO SUBMIT  PA FOR  ENTRESTO 49/51 MG  60 TABLETS  #30 DAYS- WITH  NCTRACK   [address]  WILL RECEIVE  INFORMATION @ 48 HOURS  P.A# [phone]

## 2019-04-25 ENCOUNTER — Other Ambulatory Visit: Payer: Self-pay | Admitting: Cardiology

## 2019-04-25 NOTE — Telephone Encounter (Signed)
New message   *STAT* If patient is at the pharmacy, call can be transferred to refill team.   1. Which medications need to be refilled? (please list name of each medication and dose if known) sacubitril-valsartan (ENTRESTO) 49-51 MG  2. Which pharmacy/location (including street and city if local pharmacy) is medication to be sent to?[place]  3. Do they need a 30 day or 90 day supply?[place]

## 2019-04-29 MED ORDER — ENTRESTO 49-51 MG PO TABS: 1.0000 | ORAL_TABLET | Freq: Two times a day (BID) | ORAL | 3 refills | Status: DC

## 2019-04-29 NOTE — Telephone Encounter (Signed)
Patient notified of rx sent to correct pharmacy and patient requested samples to last her until the rx comes in the mail. Samples placed up front. Patient voiced understanding and thanked me for calling. [place] and requested they discard the Entresto rx that was sent over for patient. Pharmacy tech voiced understanding.

## 2019-05-06 MED FILL — ?FUROSEMIDE 80MG TABS: 80 | 30 days supply | Qty: 15 | Fill #2

## 2019-05-07 MED FILL — ?CARVEDILOL 3.125 MG TABLET: 3.125 | 30 days supply | Qty: 60 | Fill #1

## 2019-05-07 MED FILL — ?GLIPIZIDE 10 MG TABLET: 10 | 30 days supply | Qty: 60 | Fill #6

## 2019-05-07 MED FILL — ?ATORVASTATIN 40MG TABLET: 40 | 30 days supply | Qty: 60 | Fill #1

## 2019-05-07 MED FILL — ?SPIRONOLACTONE 25 MG TABLE: 25 | 30 days supply | Qty: 15 | Fill #2

## 2019-05-14 ENCOUNTER — Telehealth: Payer: Self-pay | Admitting: Cardiology

## 2019-05-14 NOTE — Telephone Encounter (Signed)
Received forms from Disability Determination Services °Placed in interoffice mail and sent to [employer]  °

## 2019-05-16 ENCOUNTER — Telehealth: Payer: Self-pay | Admitting: *Deleted

## 2019-05-16 NOTE — Telephone Encounter (Signed)
SPOKE WITH PATIENT - IN REGARDS TO [place] VISIT 10/19 .  PATIENT PREFER IN OFFICE VISIT  - APPT CHANGED 10 /16 /20 AT 8:40  MAILED CALENDAR

## 2019-05-19 ENCOUNTER — Ambulatory Visit: Payer: Self-pay | Admitting: Cardiology

## 2019-06-16 ENCOUNTER — Ambulatory Visit: Payer: Self-pay | Admitting: Cardiology

## 2019-06-16 MED FILL — $BRILINTA 90 MG TABLET: 90 | 90 days supply | Qty: 180 | Fill #6

## 2019-06-16 MED FILL — ?FUROSEMIDE 80MG TABS: 80 | 30 days supply | Qty: 15 | Fill #3

## 2019-06-16 MED FILL — ?CARVEDILOL 3.125 MG TABLET: 3.125 | 30 days supply | Qty: 60 | Fill #2

## 2019-06-16 MED FILL — ?BASAGLAR 100 UNITS/ML KWPE: 100 | 30 days supply | Qty: 9 | Fill #0

## 2019-06-16 MED FILL — TRUE METRIX TEST STRIP: 25 days supply | Qty: 100 | Fill #2

## 2019-06-19 ENCOUNTER — Other Ambulatory Visit: Payer: Self-pay

## 2019-06-19 ENCOUNTER — Ambulatory Visit (INDEPENDENT_AMBULATORY_CARE_PROVIDER_SITE_OTHER): Payer: Self-pay | Admitting: Cardiology

## 2019-06-19 ENCOUNTER — Encounter: Payer: Self-pay | Admitting: Cardiology

## 2019-06-19 VITALS — BP 140/88 | HR 85 | Temp 95.9°F | Ht 69.0 in | Wt 244.0 lb

## 2019-06-19 DIAGNOSIS — Z794 Long term (current) use of insulin: Secondary | ICD-10-CM

## 2019-06-19 DIAGNOSIS — N181 Chronic kidney disease, stage 1: Secondary | ICD-10-CM

## 2019-06-19 DIAGNOSIS — F17200 Nicotine dependence, unspecified, uncomplicated: Secondary | ICD-10-CM

## 2019-06-19 DIAGNOSIS — E1169 Type 2 diabetes mellitus with other specified complication: Secondary | ICD-10-CM

## 2019-06-19 DIAGNOSIS — I110 Hypertensive heart disease with heart failure: Secondary | ICD-10-CM

## 2019-06-19 DIAGNOSIS — I5042 Chronic combined systolic (congestive) and diastolic (congestive) heart failure: Secondary | ICD-10-CM

## 2019-06-19 DIAGNOSIS — I5030 Unspecified diastolic (congestive) heart failure: Secondary | ICD-10-CM

## 2019-06-19 DIAGNOSIS — Z955 Presence of coronary angioplasty implant and graft: Secondary | ICD-10-CM

## 2019-06-19 DIAGNOSIS — E1122 Type 2 diabetes mellitus with diabetic chronic kidney disease: Secondary | ICD-10-CM

## 2019-06-19 DIAGNOSIS — R071 Chest pain on breathing: Secondary | ICD-10-CM

## 2019-06-19 DIAGNOSIS — I25119 Atherosclerotic heart disease of native coronary artery with unspecified angina pectoris: Secondary | ICD-10-CM

## 2019-06-19 DIAGNOSIS — E785 Hyperlipidemia, unspecified: Secondary | ICD-10-CM

## 2019-06-19 MED ORDER — CARVEDILOL 6.25 MG PO TABS: 6.2500 mg | ORAL_TABLET | Freq: Two times a day (BID) | ORAL | 3 refills | Status: DC

## 2019-06-19 MED ORDER — TICAGRELOR 60 MG PO TABS: 60.0000 mg | ORAL_TABLET | Freq: Two times a day (BID) | ORAL | 11 refills | Status: DC

## 2019-06-19 NOTE — Patient Instructions (Addendum)
Medication Instructions:   stop taking aspirin on dec 1 ,2020   increase carvedilol 6,25 mg twice a day   After finishing Brilinta 90 mg bottle stop  And start  Brilinta 60 mg twice a day    *If you need a refill on your cardiac medications before your next appointment, please call your pharmacy*  Lab Work: In the next week   Lipid  cmp  fasting If you have labs (blood work) drawn today and your tests are completely normal, you will receive your results only by: Marland Kitchen MyChart Message (if you have MyChart) OR . A paper copy in the mail If you have any lab test that is abnormal or we need to change your treatment, we will call you to review the results.  Testing/Procedures: Not needed  Follow-Up: At [facility], you and your health needs are our priority.  As part of our continuing mission to provide you with exceptional heart care, we have created designated Provider Care Teams.  These Care Teams include your primary Cardiologist (physician) and Advanced Practice Providers (APPs -  Physician Assistants and Nurse Practitioners) who all work together to provide you with the care you need, when you need it.  Your next appointment:   4 month(s)  March 2021  The format for your next appointment:   Virtual Visit   Provider:   Jory Sims, DNP, ANP  Other Instructions  8 month follow up visit with Dr Ellyn Hack

## 2019-06-19 NOTE — Progress Notes (Signed)
Primary Care Provider: [name], MD Cardiologist: Glenetta Hew, MD Electrophysiologist:   Clinic Note: Chief Complaint  Patient presents with   Follow-up    56-month  Edema    Pretty well controlled   Chest Pain    Sharp left-sided   Shortness of Breath    With exertion    HPI:    Amy Chaney a 48y.o. female with a PMH below who presents today for 663-monthollow-up for CAD/cardiomyopathy..  She has a very complicated PMH notable for difficult to control hypertension, chronic systolic CHF/ ischemic cardiomyopathy, CAD (inferior STEMI x1, anterior STEMI x2:: with prior stents to the LAD x3, diagonal, and RCA).  Management has been made difficult by history of medical noncompliance   July 2014 -> RCA PCI followed by in-stent thrombosis in September 2014 (overlapping Promus stents)  March 2019 bifurcation LAD-D2 lesion -> complicated DES PCI with ultimate loss of D2 despite attempted stent secondary to dissection and inability to rewire  December 2019 distal stent thrombosis and LAD: DES PCI of the LAD overlapping previous stents distally.  Ischemic cardiomyopathy with EF of 35 to 40% by echo   Other history includes diabetes, morbid obesity, ongoing tobacco abuse, and hyperlipidemia.  Amy Chaney last seen on:  November 07, 2018-telemedicine visit with me: Still having some exertional dyspnea, leg pain with exertion.  Legs seem to bother her more than shortness of breath.  Mild swelling.  Down to 1-2 cigarettes a day.  Had intentional weight loss.  Plan for lifelong Thienopyridine antiplatelet agent.  Okay to hold for procedures as of December 2020  Reinstituted plan to start EnDelene Lollprior authorization approved)  Recent Hospitalizations:   12/18/18 - CHF (~ Takotsubo): Admitted with flash pulmonary edema -> acute decompensated combined systolic diastolic heart failure with hypertensive crisis.  Initial blood pressures in the 240 mmHg range.   Was initially intubated, extubated after 2 days of diuresis.  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  After initial diuresis and extubation, the next day she became hypotensive requiring IV fluid boluses.  Lasix converted to oral with a negative balance of roughly 6 L.  Lisinopril was discontinued and she was started on Entresto (patient assistance arranged while in the hospital) along with carvedilol, spironolactone and oral Lasix.  She was seen twice for hospital follow-up by KaJory SimsDNP  01/23/19 -Telemedicine  hospital follow-up - 12/18/18 - CHF (~ Takotsubo, related to unexpected death of her son from MI, EF reduced to 20-25%)   Noted financial issues with Brilinta  Coaching on dietary modification to avoid salt and to discuss cardiomyopathy.  No medication changes.  Discussed daily weights.  7/27 (in person)  -> noted stable weights, but higher blood pressures than expected in the 150-160 mmHg range.  Entresto increased - 49/51   Reviewed  CV studies:    The following studies were reviewed today: (if available, images/films reviewed: From Epic Chart or Care Everywhere)  Echo 12/18/18: EF 20-25%-diffuse HK..  Moderately increased LV thickness.  GRII DD with elevated LVEDP.  Severe LA dilation.  Suspected Takotsubo's cardiomyopathy.  Echo 02/14/19: Moderate LVH.  EF improved up to 45-50%.  GR 1 DD.  Severe apical akinesis.  Normal valves.  Interval History:   Amy Chaney here today still having intermittent exertional dyspnea as well as some sharp stabbing type chest discomfort.  Did chest discomfort is actually worse with  lying down more on the left sternal border.  Not noted with activity, more with deep inspiration. She says that her exertional dyspnea has improved and she is not having any of the exertional chest tightness or pressure.  She still feels somewhat tired and fatigued, but is trying to  maintain level of activity.  She does have mild swelling, but is well controlled with her furosemide.  No real PND orthopnea.  CV Review of Symptoms (Summary) positive for - Left lateral sharp chest pain, improved exertional dyspnea but still present. negative for - edema, irregular heartbeat, loss of consciousness, palpitations, paroxysmal nocturnal dyspnea, rapid heart rate, shortness of breath or Syncope/near syncope, TIA/amaurosis fugax.  Claudication.  The patient does not have symptoms concerning for COVID-19 infection (fever, chills, cough, or new shortness of breath).  The patient is practicing social distancing. ++ Masking-but it makes her short of breath.  Has to go out every now and then for groceries/shopping., but is scared to do so.    REVIEWED OF SYSTEMS   A comprehensive ROS was performed. Review of Systems  Constitutional: Positive for malaise/fatigue (Still try to get her energy level back.  Feels better than she did the summer.). Negative for weight loss (Actually gaining weight).  HENT: Positive for congestion. Negative for nosebleeds.   Respiratory: Positive for shortness of breath (With modest exertion) and wheezing. Negative for cough.   Musculoskeletal: Positive for back pain and joint pain. Negative for myalgias.  Neurological: Negative for dizziness and focal weakness.  Psychiatric/Behavioral: Negative for depression (Perhaps a little dysthymia) and memory loss. The patient is not nervous/anxious and does not have insomnia.   All other systems reviewed and are negative.  I have reviewed and (if needed) personally updated the patient's problem list, medications, allergies, past medical and surgical history, social and family history.   PAST MEDICAL HISTORY   Past Medical History:  Diagnosis Date   Coronary artery disease involving native coronary artery of native heart with angina pectoris ([place]) 01/2013   a. s/p PCI of the mid RCA 01/2013 //  b. LHC 9/14: EF  55%, RCA stent 100% occluded, LAD irregularities >>  subacute stent thrombosis, Promus DES PCI distal overlap // c) 09/2017 - Ant STEMI - LAD-D2 PCI - Successful PCI of LAD (3 overlapping DES), unable to restore flow down D2l that was stented as well.  Likely related to downstream dissection, but unable to rewire.   Daily headache    Depression    Dyslipidemia, goal LDL below 70    History of Doppler ultrasound    carotid bruit >> a. 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  EF down to 20-25% global hypokinesis.  Was in the setting of grieving over the loss of her son from MI.  ->  EF improved to 45-50% on follow-up echocardiogram.   Hypertension    Ischemic cardiomyopathy 06/2018   s/p inferior STEMI followed by 2 anterior STEMIs: Following recovery from Takotsubo Cardiomyopathy- Echo 02/14/19: Moderate LVH.  EF improved up to 45-50%.  GR 1 DD.  Severe apical akinesis.  Normal valves.   Mild dysplasia of cervix ([facility] [facility]) 02/04/2019   Seen after colposcopy on 01/23/19 done for ASCU +HPV pap  > repeat pap and HPV test in 12 and 24 months   STEMI involving left anterior descending coronary artery ([place]) 09/2017; 06/2018   a) Severe [place] 1,  1, 1 LAD-D2 lesion (complicated by post PTCA dissection/intramural hematoma) -successful extensive PCI of the LAD but unable to maintain patency of the stented D2. b) distal mLAD stent Edge 100% thrombosis --> PTCA & Overlapping DES PCI (Synergy 3 x 12 --> 3.6-3.3 mm). D2 occluded. RCA stents patent, 65 % OM2. EF 30-35%.   STEMI involving oth coronary artery of inferior wall ([place]) 03/2013   Secondary to subacute stent thrombosis of RCA stent segment having missed 4 doses of Effient.   Tobacco abuse 01/11/2016   Type II diabetes mellitus ([place]) 12/2010   sister and son also diabetic     Cath - PCI 07/03/2018: Distal stent edge 100% thrombosis  (in the mid LAD) as culprit for anterior STEMI: Treated with PTCA and overlapping DES stent (synergy 3.0 mm x 12 mm postdilated to 3.6 mm in the overlap and 3.3 mm distally.  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 (EF roughly 30 to 35%) Diagnostic                                                       Intervention                   Wall Motion - 2 = hypokinesis     PAST SURGICAL HISTORY   Past Surgical History:  Procedure Laterality Date   CHOLECYSTECTOMY  ~ 2000   CORONARY STENT INTERVENTION N/A 10/10/2017   Procedure: CORONARY STENT INTERVENTION;  Surgeon: Leonie Man, MD;  Location: [place] [facility];  Service: Cardiovascular; LAD PCI: STENT SYNERGY DES 3.5X32 (p),STENT SYNERGY DES 3.5X 8 (m), STENT SYNERGY DES 3.5X28 (d).  D2 PCI: STENT SYNERGY DES 2.5X24.  (UNABL TO REWIRE & EXPAND - TO OF DIAG AT END OF CASE)   CORONARY STENT INTERVENTION N/A 07/03/2018   Procedure: CORONARY STENT INTERVENTION;  Surgeon: Leonie Man, MD;  Location: [facility];; PTCA & overlapping DES PCI (overlaps prior stents distally) - Synergy DES 3 x 12 (3.6 mm @ overlap -- 3.3 mm distally)>   LEFT HEART CATH AND CORONARY ANGIOGRAPHY N/A 10/10/2017   Procedure: LEFT HEART CATH AND CORONARY ANGIOGRAPHY;  Surgeon: Leonie Man, MD;  Location: [place] [facility];  Service: Cardiovascular;  Laterality: N/A; -patent RCA stents with mild in-stent restenosis. [place] 1,1,1 mLD-D2 73% (complicated by dissection/intramural thrombus)   LEFT HEART CATH AND CORONARY ANGIOGRAPHY N/A 07/03/2018   Procedure: LEFT HEART CATH AND CORONARY ANGIOGRAPHY;  Surgeon: Leonie Man, MD;  Location: [place] [facility];; 100% thrombotic occlusion distal LAD stent -- DES PCI. continued 100% D2 (previously lost). patent RCA stents, 65% OM2.  EF 30-35%.   LEFT HEART CATHETERIZATION WITH  CORONARY ANGIOGRAM N/A 02/25/2013   Procedure: LEFT HEART CATHETERIZATION WITH CORONARY ANGIOGRAM;  Surgeon: Laverda Page, MD;  Location: [facility] CATH LAB;  Service: Cardiovascular;; CTO m RCA; otherwise normal coronaries   LEFT HEART CATHETERIZATION WITH CORONARY ANGIOGRAM N/A 04/01/2013   Procedure: LEFT HEART CATHETERIZATION WITH CORONARY ANGIOGRAM;  Surgeon: Laverda Page, MD;  [facility];  Service: Cardiovascular: 100% occlusion of distal RCA stent (subacute stent thrombosis -  secondary to stopping Effient).  Overlapping DES PCI   NM MYOVIEW LTD  12/2015    EF 38%.  Hypertensive response to exercise (231/132 mmHg).  INTERMEDIATE RISK due to -diffuse hypokinesis and reduced EF.  No ischemia or infarction.   PERCUTANEOUS CORONARY STENT INTERVENTION (PCI-S)  04/01/2013   Procedure: PERCUTANEOUS CORONARY STENT INTERVENTION (PCI-S);  Surgeon: Laverda Page, MD;  Location: [facility] CATH LAB;  Service: Cardiovascular;; PCI of distal RCA stent subacute thrombosis: Promus Premier DES 3.0 mm x 20 mm.   PERCUTANEOUS CORONARY STENT INTERVENTION (PCI-S)  02/25/2013   Procedure: PERCUTANEOUS CORONARY STENT INTERVENTION (PCI-S);  Surgeon: Laverda Page, MD;  Location: [facility] C[facility] LAB;  RCA CTO PCI with overlapping Promus DES: 3.0 mm x 38 mm, 3.5 mm x 55m   TRANSTHORACIC ECHOCARDIOGRAM  07/04/2018   recurrent Anterior STEMI: EF 35-40%.  Apical hypokinesis.  GRII DD.  No thrombus noted.  (Improved EF from 30% up to 35-40% from previous echo)   TRANSTHORACIC ECHOCARDIOGRAM  02/14/2019   Echo 02/14/19: Moderate LVH.  EF improved up to 45-50%.  GR 1 DD.  Severe apical akinesis.  Normal valves.   TRANSTHORACIC ECHOCARDIOGRAM  12/18/2018   TAKOTUSBO CM: EF 20-25%-diffuse HK..  Moderately increased LV thickness.  GRII DD with elevated LVEDP.  Severe LA dilation.  Suspected Takotsubo's cardiomyopathy.   TUBAL LIGATION  1994     MEDICATIONS/ALLERGIES   Current Meds  Medication Sig    atorvastatin (LIPITOR) 80 MG tablet Take 1 tablet (80 mg total) by mouth daily.   blood glucose meter kit and supplies Dispense based on patient and insurance preference. Use up to four times daily as directed. (FOR ICD-10 E10.9, E11.9).   Blood Glucose Monitoring Suppl (TRUE METRIX METER) DEVI 1 kit by Does not apply route 4 (four) times daily.   furosemide (LASIX) 40 MG tablet Take 1 tablet (40 mg total) by mouth daily.   gabapentin (NEURONTIN) 300 MG capsule Take 1 capsule (300 mg total) by mouth at bedtime.   glipiZIDE (GLUCOTROL) 10 MG tablet Take 1 tablet (10 mg total) by mouth 2 (two) times daily before a meal.   glucose blood test strip Use as instructed   Insulin Glargine (LANTUS SOLOSTAR) 100 UNIT/ML Solostar Pen Inject 30 Units into the skin daily.   metroNIDAZOLE (FLAGYL) 500 MG tablet Take 1 tablet (500 mg total) by mouth 2 (two) times daily.   nitroGLYCERIN (NITROSTAT) 0.4 MG SL tablet Place 1 tablet (0.4 mg total) under the tongue every 5 (five) minutes as needed for chest pain. Reported on 11/25/2015   sacubitril-valsartan (ENTRESTO) 49-51 MG Take 1 tablet by mouth 2 (two) times daily.   spironolactone (ALDACTONE) 25 MG tablet Take 0.5 tablets (12.5 mg total) by mouth daily.   TRUEPLUS LANCETS 28G MISC 28 g by Does not apply route 4 (four) times daily.   [DISCONTINUED] aspirin EC 81 MG tablet Take 1 tablet (81 mg total) by mouth daily.   [DISCONTINUED] carvedilol (COREG) 3.125 MG tablet Take 1 tablet (3.125 mg total) by mouth 2 (two) times daily.   [DISCONTINUED] ticagrelor (BRILINTA) 90 MG TABS tablet Take 1 tablet (90 mg total) by mouth 2 (two) times daily.    No Known Allergies   SOCIAL HISTORY/FAMILY HISTORY   Social History   Tobacco Use   Smoking status: Current Every Day Smoker    Packs/day: 0.00    Years: 22.00    Pack years: 0.00    Types: Cigarettes   Smokeless tobacco: Never Used   Tobacco comment: pt states she smokes about 1-2 cigarettes  per day--08/01/18  Substance Use Topics   Alcohol use: Yes    Alcohol/week: 0.0 standard drinks    Comment: 07/04/2018 "only on special occasions; maybe once/yr"   Drug use: Yes    Types: Marijuana    Comment: 02/25/2013 "quit marijuana ~ 2001"   Social History   Social History Narrative   Not on file    Family History family history includes Diabetes in her mother, sister, sister, son, and son.   OBJCTIVE -PE, EKG, labs   Wt Readings from Last 3 Encounters:  06/19/19 244 lb (110.7 kg)  02/26/19 231 lb (104.8 kg)  02/24/19 229 lb 9.6 oz (104.1 kg)    Physical Exam: BP 140/88 (BP Location: Left Arm, Patient Position: Sitting, Cuff Size: Large)    Pulse 85    Temp (!) 95.9 F (35.5 C)    Ht 5' 9"  (1.753 m)    Wt 244 lb (110.7 kg)    LMP 10/20/2015    BMI 36.03 kg/m  Physical Exam  Constitutional: She is oriented to person, place, and time. She appears well-developed and well-nourished. No distress.  Well-groomed.  Healthy-appearing.  HENT:  Head: Normocephalic and atraumatic.  Eyes: Pupils are equal, round, and reactive to light. EOM are normal.  Neck: Normal range of motion. Neck supple. Hepatojugular reflux (Mild) present. No JVD present. Carotid bruit is not present.  Cardiovascular: Normal rate, regular rhythm, S1 normal, S2 normal and intact distal pulses.  No extrasystoles are present. PMI is not displaced (Difficult to palpate due to body habitus). Exam reveals gallop, S4 and distant heart sounds. Exam reveals no friction rub.  No murmur heard. Pulmonary/Chest: Effort normal. No respiratory distress. She has wheezes. She exhibits tenderness (Reproducible chest pain along the left sternal border second through fourth ribs.).  Abdominal: Soft. Bowel sounds are normal. She exhibits no distension. There is no abdominal tenderness. There is no rebound.  No obvious HSM  Musculoskeletal: Normal range of motion.        General: Edema (Trace to 1+ bilateral LE) present.    Neurological: She is alert and oriented to person, place, and time.  Skin: Skin is warm and dry.  Psychiatric: She has a normal mood and affect. Her behavior is normal. Judgment and thought content normal.  Cc a little bit more coherent and understanding today's visit compared to previous visits.    Adult ECG Report  Rate: 85 ;  Rhythm: normal sinus rhythm and Left atrial abnormality, LVH;   Narrative Interpretation: Relatively stable EKG  Recent Labs:   Lab Results  Component Value Date   HGBA1C 8.2 (H) 12/21/2018    Lab Results  Component Value Date   CHOL 164 07/05/2018   HDL 39 (L) 07/05/2018   LDLCALC 110 (H) 07/05/2018   TRIG 85 12/18/2018   CHOLHDL 4.2 07/05/2018    Lab Results  Component Value Date   CREATININE 0.84 12/30/2018   BUN 12 12/30/2018   NA 139 12/30/2018   K 4.5 12/30/2018   CL 104 12/30/2018   CO2 22 12/30/2018    ASSESSMENT/PLAN    Problem List Items Addressed This Visit    Coronary artery disease involving native coronary artery of native heart with angina pectoris ([place]) - Primary (Chronic)    She now has extensive PCI to both the LAD and the RCA from her multiple MIs.  She is on high-dose high intensity atorvastatin along with aspirin plus Brilinta/ticagrelor. Is on beta-blocker and Entresto along with spironolactone as noted elsewhere.  Continue to  further titrate medications.  Plan   reduce Brilinta/ticagrelor to maintenance dose of 60 mg twice daily as of December (she just got her prescription refilled so the next one will be for the 60 mg twice daily.   Okay to stop aspirin as of December and continue monotherapy with maintenance dose Brilinta.  As of December 2020, would be okay to hold Brilinta temporarily for procedures but will prefer to continue either Brilinta or Plavix lifelong.      Relevant Medications   carvedilol (COREG) 6.25 MG tablet   Other Relevant Orders   EKG 12-Lead   Lipid panel   Comprehensive metabolic  panel   Hypertension, accelerated, with diastolic congestive heart failure, NYHA class 1 (HCC) (Chronic)    She clearly has history of hypertensive urgency related heart failure with worsening EF.  He has now on moderate dose Entresto, low-dose carvedilol plus spironolactone.  With her heart rate of 85 bpm, plan for now will be to increase carvedilol to 6.25 mg twice daily.  Based on blood pressure and heart rate next visit, would probably titrate Entresto to max dose and then consider further titration of carvedilol and/or spironolactone.      Relevant Medications   carvedilol (COREG) 6.25 MG tablet   Hyperlipidemia associated with type 2 diabetes mellitus (HCC) (Chronic)   Relevant Orders   EKG 12-Lead   Lipid panel   Comprehensive metabolic panel   S/P primary angioplasty with coronary stent (Chronic)    Extensive DES PCI to RCA and LAD. December 2020 will be 1 year out from most recent PCI.  Plan:  Uninterrupted Brilinta through this month, starting next prescription, will reduce to 60 mg twice daily maintenance dosing.  Okay to stop aspirin as of December 2020.  Plan to continue maintenance Brilinta (or other Thienopyridine) lifelong, but as of December 2020 okay to interrupt for procedures--hold 5-7 days preop)      Chronic combined systolic (congestive) and diastolic (congestive) heart failure (HCC) (Chronic)    Thankfully, her EF actually improved following recovery from her Takotsubo cardiomyopathy.  Current EF actually is better than it was back in December.  This may be partly related to the fact that she is now on (and taking) appropriate medications.  Plan:   Increase carvedilol to 625 mg twice daily  Continue current dose of Entresto, ending consider increasing on next visit.  For now continue current dose spironolactone, low threshold to titrate further to 25 mg.  Is on standing dose of Lasix, we discussed sliding scale for change in weight and symptoms.  She  does have diabetes, consider addition or conversion to SGLT2 inhibitor (empagliflozin, dapagliflozin)       Relevant Medications   carvedilol (COREG) 6.25 MG tablet   Other Relevant Orders   EKG 12-Lead   Lipid panel   Comprehensive metabolic panel   Smoker (Chronic)    Discussed try to make the final step to quitting.  I think with the stress of her son's death, she is probably not ready psychologically to do this.      Type II diabetes mellitus ([place]) (Chronic)    She is now on glipizide plus insulin.  Consider converting from glipizide to SGLT2 inhibitor (or potentially GLP 1 agonist).  Defer to PCP.      Dyslipidemia, goal LDL below 70 (Chronic)    Due for lipid check-ordered for next week.. Currently on high-dose atorvastatin.  Low threshold to add Zetia       Relevant Medications  carvedilol (COREG) 6.25 MG tablet   Chest pain    Chest pain she described to me today as musculoskeletal in nature.  Recommend Tylenol, and avoid sleeping on 1 side          COVID-19 Education: The signs and symptoms of COVID-19 were discussed with the patient and how to seek care for testing (follow up with PCP or arrange E-visit).   The importance of social distancing was discussed today.  I spent a total of 26 minutes with the patient and chart review. >  50% of the time was spent in direct patient consultation.  Additional time spent with chart review (studies, outside notes, etc): 16 Total Time: 42 min -=-> Extensive chart review with hospitalization, clinic follow-up reports and multiple studies.  Extensive/complex medical decision making.  Current medicines are reviewed at length with the patient today.  (+/- concerns) n/a   Patient Instructions / Medication Changes & Studies & Tests Ordered   Patient Instructions  Medication Instructions:   stop taking aspirin on dec 1 ,2020   increase carvedilol 6,25 mg twice a day   After finishing Brilinta 90 mg bottle stop  And start   Brilinta 60 mg twice a day    *If you need a refill on your cardiac medications before your next appointment, please call your pharmacy*  Lab Work: In the next week   Lipid  cmp  fasting If you have labs (blood work) drawn today and your tests are completely normal, you will receive your results only by:  [place] (if you have MyChart) OR  A paper copy in the mail If you have any lab test that is abnormal or we need to change your treatment, we will call you to review the results.  Testing/Procedures: Not needed  Follow-Up: At [facility], you and your health needs are our priority.  As part of our continuing mission to provide you with exceptional heart care, we have created designated Provider Care Teams.  These Care Teams include your primary Cardiologist (physician) and Advanced Practice Providers (APPs -  Physician Assistants and Nurse Practitioners) who all work together to provide you with the care you need, when you need it.  Your next appointment:   4 month(s)  March 2021  The format for your next appointment:   Virtual Visit   Provider:   Jory Sims, DNP, ANP  Other Instructions  8 month follow up visit with Dr Ellyn Hack   Studies Ordered:   Orders Placed This Encounter  Procedures   Lipid panel   Comprehensive metabolic panel   EKG 24-SLPN     Glenetta Hew, M.D., M.S. Interventional Cardiologist   Pager # [phone] Phone # [phone] [address] [place], [place] [address]   Thank you for choosing Heartcare at [facility]!!

## 2019-06-20 MED FILL — BRILINTA 60 MG TABLET: 60 | 30 days supply | Qty: 60 | Fill #0

## 2019-06-22 ENCOUNTER — Encounter: Payer: Self-pay | Admitting: Cardiology

## 2019-06-22 NOTE — Assessment & Plan Note (Signed)
Extensive DES PCI to RCA and LAD. December 2020 will be 1 year out from most recent PCI.  Plan:  Uninterrupted Brilinta through this month, starting next prescription, will reduce to 60 mg twice daily maintenance dosing.  Okay to stop aspirin as of December 2020.  Plan to continue maintenance Brilinta (or other Thienopyridine) lifelong, but as of December 2020 okay to interrupt for procedures--hold 5-7 days preop)

## 2019-06-22 NOTE — Assessment & Plan Note (Signed)
Discussed try to make the final step to quitting.  I think with the stress of her son's death, she is probably not ready psychologically to do this.

## 2019-06-22 NOTE — Assessment & Plan Note (Signed)
She is now on glipizide plus insulin.  Consider converting from glipizide to SGLT2 inhibitor (or potentially GLP 1 agonist).  Defer to PCP.

## 2019-06-22 NOTE — Assessment & Plan Note (Signed)
Due for lipid check-ordered for next week.. Currently on high-dose atorvastatin.  Low threshold to add Zetia

## 2019-06-22 NOTE — Assessment & Plan Note (Signed)
She now has extensive PCI to both the LAD and the RCA from her multiple MIs.  She is on high-dose high intensity atorvastatin along with aspirin plus Brilinta/ticagrelor. Is on beta-blocker and Entresto along with spironolactone as noted elsewhere.  Continue to further titrate medications.  Plan   reduce Brilinta/ticagrelor to maintenance dose of 60 mg twice daily as of December (she just got her prescription refilled so the next one will be for the 60 mg twice daily.   Okay to stop aspirin as of December and continue monotherapy with maintenance dose Brilinta.  As of December 2020, would be okay to hold Brilinta temporarily for procedures but will prefer to continue either Brilinta or Plavix lifelong.

## 2019-06-22 NOTE — Assessment & Plan Note (Signed)
Thankfully, her EF actually improved following recovery from her Takotsubo cardiomyopathy.  Current EF actually is better than it was back in December.  This may be partly related to the fact that she is now on (and taking) appropriate medications.  Plan:   Increase carvedilol to 625 mg twice daily  Continue current dose of Entresto, ending consider increasing on next visit.  For now continue current dose spironolactone, low threshold to titrate further to 25 mg.  Is on standing dose of Lasix, we discussed sliding scale for change in weight and symptoms.  She does have diabetes, consider addition or conversion to SGLT2 inhibitor (empagliflozin, dapagliflozin)

## 2019-06-22 NOTE — Assessment & Plan Note (Addendum)
Chest pain she described to me today as musculoskeletal in nature.  Recommend Tylenol, and avoid sleeping on 1 side

## 2019-06-22 NOTE — Assessment & Plan Note (Signed)
She clearly has history of hypertensive urgency related heart failure with worsening EF.  He has now on moderate dose Entresto, low-dose carvedilol plus spironolactone.  With her heart rate of 85 bpm, plan for now will be to increase carvedilol to 6.25 mg twice daily.  Based on blood pressure and heart rate next visit, would probably titrate Entresto to max dose and then consider further titration of carvedilol and/or spironolactone.

## 2019-06-25 MED FILL — BRILINTA 60 MG TABLET: 60 | 30 days supply | Qty: 60 | Fill #0

## 2019-07-11 MED FILL — ?GLIPIZIDE 10 MG TABLET: 10 | 30 days supply | Qty: 60 | Fill #0

## 2019-07-11 MED FILL — ?CARVEDILOL 3.125 MG TABLET: 3.125 | 30 days supply | Qty: 60 | Fill #3

## 2019-07-31 MED FILL — ?SPIRONOLACTONE 25 MG TABLE: 25 | 30 days supply | Qty: 15 | Fill #4

## 2019-08-04 MED FILL — BRILINTA 60 MG TABLET: 60 | 30 days supply | Qty: 60 | Fill #1

## 2019-08-21 MED FILL — ?GLIPIZIDE 10 MG TABLET: 10 | 30 days supply | Qty: 60 | Fill #1

## 2019-08-21 MED FILL — CARVEDILOL 6.25 MG TABLET: 6.25 | 30 days supply | Qty: 60 | Fill #0

## 2019-08-22 ENCOUNTER — Other Ambulatory Visit: Payer: Self-pay | Admitting: Cardiology

## 2019-08-22 NOTE — Telephone Encounter (Signed)
New Message  Patient calling the office for samples of medication:   1.  What medication and dosage are you requesting samples for? sacubitril-valsartan (ENTRESTO) 49-51 MG  2.  Are you currently out of this medication?  Yes   Patient states that she is out of the medication and her ankles are now swollen. Please assist.

## 2019-08-22 NOTE — Telephone Encounter (Signed)
No samples Left message to call back

## 2019-09-12 MED FILL — BRILINTA 60 MG TABLET: 60 | 30 days supply | Qty: 60 | Fill #2

## 2019-09-12 MED FILL — ?SPIRONOLACTONE 25 MG TABLE: 25 | 30 days supply | Qty: 15 | Fill #5

## 2019-09-29 MED FILL — CARVEDILOL 6.25 MG TABLET: 6.25 | 30 days supply | Qty: 60 | Fill #1

## 2019-10-19 NOTE — Progress Notes (Signed)
Error, no show

## 2019-10-20 ENCOUNTER — Encounter: Payer: Self-pay | Admitting: Adult Health

## 2019-10-20 DIAGNOSIS — I10 Essential (primary) hypertension: Secondary | ICD-10-CM

## 2019-10-20 DIAGNOSIS — I2510 Atherosclerotic heart disease of native coronary artery without angina pectoris: Secondary | ICD-10-CM

## 2019-10-24 MED FILL — BRILINTA 60 MG TABLET: 60 | 30 days supply | Qty: 60 | Fill #3

## 2019-10-27 MED FILL — ?SPIRONOLACTONE 25 MG TABLE: 25 | 30 days supply | Qty: 15 | Fill #6

## 2019-10-28 ENCOUNTER — Emergency Department (HOSPITAL_COMMUNITY)
Admission: EM | Admit: 2019-10-28 | Discharge: 2019-10-28 | Disposition: A | Discharge status: Home / Self Care | Payer: Medicaid Other | Attending: Emergency Medicine | Admitting: Emergency Medicine

## 2019-10-28 ENCOUNTER — Emergency Department (HOSPITAL_COMMUNITY): Payer: Medicaid Other

## 2019-10-28 ENCOUNTER — Other Ambulatory Visit: Payer: Self-pay

## 2019-10-28 DIAGNOSIS — Z5321 Procedure and treatment not carried out due to patient leaving prior to being seen by health care provider: Secondary | ICD-10-CM | POA: Insufficient documentation

## 2019-10-28 DIAGNOSIS — R079 Chest pain, unspecified: Secondary | ICD-10-CM | POA: Insufficient documentation

## 2019-10-28 LAB — BASIC METABOLIC PANEL
Anion gap: 11 (ref 5–15)
BUN: 10 mg/dL (ref 6–20)
CO2: 23 mmol/L (ref 22–32)
Calcium: 9 mg/dL (ref 8.9–10.3)
Chloride: 104 mmol/L (ref 98–111)
Creatinine, Ser: 0.81 mg/dL (ref 0.44–1.00)
GFR calc Af Amer: >60 mL/min (ref >60)
GFR calc non Af Amer: >60 mL/min (ref >60)
Glucose, Bld: 346 mg/dL — ABNORMAL HIGH (ref 70–99)
Potassium: 3.9 mmol/L (ref 3.5–5.1)
Sodium: 138 mmol/L (ref 135–145)

## 2019-10-28 LAB — CBC
HCT: 36.8 % (ref 36.0–46.0)
Hemoglobin: 11.5 g/dL — ABNORMAL LOW (ref 12.0–15.0)
MCH: 26.4 pg (ref 26.0–34.0)
MCHC: 31.3 g/dL (ref 30.0–36.0)
MCV: 84.4 fL (ref 80.0–100.0)
Platelets: 352 10*3/uL (ref 150–400)
RBC: 4.36 MIL/uL (ref 3.87–5.11)
RDW: 14.3 % (ref 11.5–15.5)
WBC: 11.6 10*3/uL — ABNORMAL HIGH (ref 4.0–10.5)
nRBC: 0 % (ref 0.0–0.2)

## 2019-10-28 LAB — TROPONIN I (HIGH SENSITIVITY): Troponin I (High Sensitivity): 17 ng/L (ref <18)

## 2019-10-28 MED ORDER — SODIUM CHLORIDE 0.9% FLUSH: 3.0000 mL | Freq: Once | INTRAVENOUS | Status: DC

## 2019-10-28 NOTE — ED Triage Notes (Signed)
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 Endorses associated shortness of breath, pain and shob worse with exertion and movement of L arm.

## 2019-10-31 ENCOUNTER — Inpatient Hospital Stay (HOSPITAL_COMMUNITY): Payer: Self-pay

## 2019-10-31 ENCOUNTER — Other Ambulatory Visit: Payer: Self-pay

## 2019-10-31 ENCOUNTER — Emergency Department (HOSPITAL_COMMUNITY): Payer: Self-pay

## 2019-10-31 ENCOUNTER — Inpatient Hospital Stay (HOSPITAL_COMMUNITY)
Admission: EM | Admit: 2019-10-31 | Discharge: 2019-11-04 | DRG: 286 | Disposition: A | Discharge status: Home / Self Care | Payer: Self-pay | Attending: Pulmonary Disease | Admitting: Pulmonary Disease

## 2019-10-31 DIAGNOSIS — I2510 Atherosclerotic heart disease of native coronary artery without angina pectoris: Secondary | ICD-10-CM | POA: Diagnosis present

## 2019-10-31 DIAGNOSIS — T502X5A Adverse effect of carbonic-anhydrase inhibitors, benzothiadiazides and other diuretics, initial encounter: Secondary | ICD-10-CM | POA: Diagnosis present

## 2019-10-31 DIAGNOSIS — R69 Illness, unspecified: Secondary | ICD-10-CM

## 2019-10-31 DIAGNOSIS — Z9049 Acquired absence of other specified parts of digestive tract: Secondary | ICD-10-CM

## 2019-10-31 DIAGNOSIS — I110 Hypertensive heart disease with heart failure: Secondary | ICD-10-CM | POA: Diagnosis present

## 2019-10-31 DIAGNOSIS — Z7902 Long term (current) use of antithrombotics/antiplatelets: Secondary | ICD-10-CM

## 2019-10-31 DIAGNOSIS — Z79899 Other long term (current) drug therapy: Secondary | ICD-10-CM

## 2019-10-31 DIAGNOSIS — J9602 Acute respiratory failure with hypercapnia: Secondary | ICD-10-CM | POA: Diagnosis present

## 2019-10-31 DIAGNOSIS — Z955 Presence of coronary angioplasty implant and graft: Secondary | ICD-10-CM

## 2019-10-31 DIAGNOSIS — Z9114 Patient's other noncompliance with medication regimen: Secondary | ICD-10-CM

## 2019-10-31 DIAGNOSIS — E1165 Type 2 diabetes mellitus with hyperglycemia: Secondary | ICD-10-CM | POA: Diagnosis present

## 2019-10-31 DIAGNOSIS — G92 Toxic encephalopathy: Secondary | ICD-10-CM | POA: Diagnosis present

## 2019-10-31 DIAGNOSIS — I340 Nonrheumatic mitral (valve) insufficiency: Secondary | ICD-10-CM

## 2019-10-31 DIAGNOSIS — I161 Hypertensive emergency: Principal | ICD-10-CM | POA: Diagnosis present

## 2019-10-31 DIAGNOSIS — J810 Acute pulmonary edema: Secondary | ICD-10-CM

## 2019-10-31 DIAGNOSIS — J811 Chronic pulmonary edema: Secondary | ICD-10-CM | POA: Diagnosis present

## 2019-10-31 DIAGNOSIS — J189 Pneumonia, unspecified organism: Secondary | ICD-10-CM | POA: Diagnosis present

## 2019-10-31 DIAGNOSIS — R197 Diarrhea, unspecified: Secondary | ICD-10-CM | POA: Diagnosis present

## 2019-10-31 DIAGNOSIS — R092 Respiratory arrest: Secondary | ICD-10-CM

## 2019-10-31 DIAGNOSIS — E874 Mixed disorder of acid-base balance: Secondary | ICD-10-CM | POA: Diagnosis present

## 2019-10-31 DIAGNOSIS — Z20822 Contact with and (suspected) exposure to covid-19: Secondary | ICD-10-CM | POA: Diagnosis present

## 2019-10-31 DIAGNOSIS — Z87410 Personal history of cervical dysplasia: Secondary | ICD-10-CM

## 2019-10-31 DIAGNOSIS — I5043 Acute on chronic combined systolic (congestive) and diastolic (congestive) heart failure: Secondary | ICD-10-CM | POA: Diagnosis present

## 2019-10-31 DIAGNOSIS — Z978 Presence of other specified devices: Secondary | ICD-10-CM

## 2019-10-31 DIAGNOSIS — Z833 Family history of diabetes mellitus: Secondary | ICD-10-CM

## 2019-10-31 DIAGNOSIS — I25119 Atherosclerotic heart disease of native coronary artery with unspecified angina pectoris: Secondary | ICD-10-CM

## 2019-10-31 DIAGNOSIS — I361 Nonrheumatic tricuspid (valve) insufficiency: Secondary | ICD-10-CM

## 2019-10-31 DIAGNOSIS — K5900 Constipation, unspecified: Secondary | ICD-10-CM | POA: Diagnosis not present

## 2019-10-31 DIAGNOSIS — F17210 Nicotine dependence, cigarettes, uncomplicated: Secondary | ICD-10-CM | POA: Diagnosis present

## 2019-10-31 DIAGNOSIS — E785 Hyperlipidemia, unspecified: Secondary | ICD-10-CM | POA: Diagnosis present

## 2019-10-31 DIAGNOSIS — I255 Ischemic cardiomyopathy: Secondary | ICD-10-CM | POA: Diagnosis present

## 2019-10-31 DIAGNOSIS — J9601 Acute respiratory failure with hypoxia: Secondary | ICD-10-CM | POA: Diagnosis present

## 2019-10-31 DIAGNOSIS — Z794 Long term (current) use of insulin: Secondary | ICD-10-CM

## 2019-10-31 DIAGNOSIS — D649 Anemia, unspecified: Secondary | ICD-10-CM | POA: Diagnosis present

## 2019-10-31 DIAGNOSIS — I252 Old myocardial infarction: Secondary | ICD-10-CM

## 2019-10-31 HISTORY — PX: TRANSTHORACIC ECHOCARDIOGRAM: SHX275

## 2019-10-31 LAB — POCT I-STAT 7, (LYTES, BLD GAS, ICA,H+H)
Acid-Base Excess: 1 mmol/L (ref 0.0–2.0)
Acid-base deficit: 3 mmol/L — ABNORMAL HIGH (ref 0.0–2.0)
Bicarbonate: 26.3 mmol/L (ref 20.0–28.0)
Bicarbonate: 26.2 mmol/L (ref 20.0–28.0)
Calcium, Ion: 1.19 mmol/L (ref 1.15–1.40)
Calcium, Ion: 1.16 mmol/L (ref 1.15–1.40)
HCT: 42 % (ref 36.0–46.0)
HCT: 39 % (ref 36.0–46.0)
Hemoglobin: 14.3 g/dL (ref 12.0–15.0)
Hemoglobin: 13.3 g/dL (ref 12.0–15.0)
O2 Saturation: 94 %
O2 Saturation: 100 %
Potassium: 4.2 mmol/L (ref 3.5–5.1)
Potassium: 3.3 mmol/L — ABNORMAL LOW (ref 3.5–5.1)
Sodium: 137 mmol/L (ref 135–145)
Sodium: 139 mmol/L (ref 135–145)
TCO2: 28 mmol/L (ref 22–32)
TCO2: 27 mmol/L (ref 22–32)
pCO2 arterial: 67.8 mmHg (ref 32.0–48.0)
pCO2 arterial: 42.6 mmHg (ref 32.0–48.0)
pH, Arterial: 7.196 — CL (ref 7.350–7.450)
pH, Arterial: 7.397 (ref 7.350–7.450)
pO2, Arterial: 87 mmHg (ref 83.0–108.0)
pO2, Arterial: 266 mmHg — ABNORMAL HIGH (ref 83.0–108.0)

## 2019-10-31 LAB — CBC WITH DIFFERENTIAL/PLATELET
Abs Immature Granulocytes: 0.13 10*3/uL — ABNORMAL HIGH (ref 0.00–0.07)
Basophils Absolute: 0.1 10*3/uL (ref 0.0–0.1)
Basophils Relative: 1 %
Eosinophils Absolute: 0.4 10*3/uL (ref 0.0–0.5)
Eosinophils Relative: 2 %
HCT: 42.1 % (ref 36.0–46.0)
Hemoglobin: 12.3 g/dL (ref 12.0–15.0)
Immature Granulocytes: 1 %
Lymphocytes Relative: 39 %
Lymphs Abs: 6.2 10*3/uL — ABNORMAL HIGH (ref 0.7–4.0)
MCH: 26.1 pg (ref 26.0–34.0)
MCHC: 29.2 g/dL — ABNORMAL LOW (ref 30.0–36.0)
MCV: 89.4 fL (ref 80.0–100.0)
Monocytes Absolute: 0.8 10*3/uL (ref 0.1–1.0)
Monocytes Relative: 5 %
Neutro Abs: 8.4 10*3/uL — ABNORMAL HIGH (ref 1.7–7.7)
Neutrophils Relative %: 52 %
Platelets: 428 10*3/uL — ABNORMAL HIGH (ref 150–400)
RBC: 4.71 MIL/uL (ref 3.87–5.11)
RDW: 14.4 % (ref 11.5–15.5)
WBC: 16 10*3/uL — ABNORMAL HIGH (ref 4.0–10.5)
nRBC: 0 % (ref 0.0–0.2)

## 2019-10-31 LAB — URINALYSIS, ROUTINE W REFLEX MICROSCOPIC
Bilirubin Urine: NEGATIVE
Glucose, UA: >=500 mg/dL — AB
Hgb urine dipstick: NEGATIVE
Ketones, ur: NEGATIVE mg/dL
Leukocytes,Ua: NEGATIVE
Nitrite: NEGATIVE
Protein, ur: 30 mg/dL — AB
Specific Gravity, Urine: 1.005 (ref 1.005–1.030)
pH: 6 (ref 5.0–8.0)

## 2019-10-31 LAB — COMPREHENSIVE METABOLIC PANEL
ALT: 17 U/L (ref 0–44)
ALT: 19 U/L (ref 0–44)
AST: 32 U/L (ref 15–41)
AST: 23 U/L (ref 15–41)
Albumin: 3.2 g/dL — ABNORMAL LOW (ref 3.5–5.0)
Albumin: 3 g/dL — ABNORMAL LOW (ref 3.5–5.0)
Alkaline Phosphatase: 45 U/L (ref 38–126)
Alkaline Phosphatase: 43 U/L (ref 38–126)
Anion gap: 15 (ref 5–15)
Anion gap: 13 (ref 5–15)
BUN: 12 mg/dL (ref 6–20)
BUN: 13 mg/dL (ref 6–20)
CO2: 18 mmol/L — ABNORMAL LOW (ref 22–32)
CO2: 23 mmol/L (ref 22–32)
Calcium: 8.4 mg/dL — ABNORMAL LOW (ref 8.9–10.3)
Calcium: 8.3 mg/dL — ABNORMAL LOW (ref 8.9–10.3)
Chloride: 103 mmol/L (ref 98–111)
Chloride: 105 mmol/L (ref 98–111)
Creatinine, Ser: 1.04 mg/dL — ABNORMAL HIGH (ref 0.44–1.00)
Creatinine, Ser: 0.89 mg/dL (ref 0.44–1.00)
GFR calc Af Amer: >60 mL/min (ref >60)
GFR calc Af Amer: >60 mL/min (ref >60)
GFR calc non Af Amer: >60 mL/min (ref >60)
GFR calc non Af Amer: >60 mL/min (ref >60)
Glucose, Bld: 504 mg/dL (ref 70–99)
Glucose, Bld: 182 mg/dL — ABNORMAL HIGH (ref 70–99)
Potassium: 3.7 mmol/L (ref 3.5–5.1)
Potassium: 3.5 mmol/L (ref 3.5–5.1)
Sodium: 136 mmol/L (ref 135–145)
Sodium: 141 mmol/L (ref 135–145)
Total Bilirubin: 1 mg/dL (ref 0.3–1.2)
Total Bilirubin: 0.5 mg/dL (ref 0.3–1.2)
Total Protein: 7.6 g/dL (ref 6.5–8.1)
Total Protein: 7.1 g/dL (ref 6.5–8.1)

## 2019-10-31 LAB — CBC
HCT: 40.7 % (ref 36.0–46.0)
HCT: 37.5 % (ref 36.0–46.0)
Hemoglobin: 12.5 g/dL (ref 12.0–15.0)
Hemoglobin: 11.6 g/dL — ABNORMAL LOW (ref 12.0–15.0)
MCH: 26.5 pg (ref 26.0–34.0)
MCH: 26.6 pg (ref 26.0–34.0)
MCHC: 30.7 g/dL (ref 30.0–36.0)
MCHC: 30.9 g/dL (ref 30.0–36.0)
MCV: 86.4 fL (ref 80.0–100.0)
MCV: 86 fL (ref 80.0–100.0)
Platelets: 403 10*3/uL — ABNORMAL HIGH (ref 150–400)
Platelets: 381 10*3/uL (ref 150–400)
RBC: 4.71 MIL/uL (ref 3.87–5.11)
RBC: 4.36 MIL/uL (ref 3.87–5.11)
RDW: 14.3 % (ref 11.5–15.5)
RDW: 14.4 % (ref 11.5–15.5)
WBC: 18 10*3/uL — ABNORMAL HIGH (ref 4.0–10.5)
WBC: 19.2 10*3/uL — ABNORMAL HIGH (ref 4.0–10.5)
nRBC: 0 % (ref 0.0–0.2)
nRBC: 0 % (ref 0.0–0.2)

## 2019-10-31 LAB — CREATININE, SERUM
Creatinine, Ser: 1.03 mg/dL — ABNORMAL HIGH (ref 0.44–1.00)
GFR calc Af Amer: >60 mL/min (ref >60)
GFR calc non Af Amer: >60 mL/min (ref >60)

## 2019-10-31 LAB — I-STAT CHEM 8, ED
BUN: 15 mg/dL (ref 6–20)
Calcium, Ion: 1.14 mmol/L — ABNORMAL LOW (ref 1.15–1.40)
Chloride: 104 mmol/L (ref 98–111)
Creatinine, Ser: 0.8 mg/dL (ref 0.44–1.00)
Glucose, Bld: 517 mg/dL (ref 70–99)
HCT: 39 % (ref 36.0–46.0)
Hemoglobin: 13.3 g/dL (ref 12.0–15.0)
Potassium: 4.1 mmol/L (ref 3.5–5.1)
Sodium: 139 mmol/L (ref 135–145)
TCO2: 24 mmol/L (ref 22–32)

## 2019-10-31 LAB — CBG MONITORING, ED
Glucose-Capillary: 419 mg/dL — ABNORMAL HIGH (ref 70–99)
Glucose-Capillary: 457 mg/dL — ABNORMAL HIGH (ref 70–99)
Glucose-Capillary: 375 mg/dL — ABNORMAL HIGH (ref 70–99)
Glucose-Capillary: 287 mg/dL — ABNORMAL HIGH (ref 70–99)
Glucose-Capillary: 221 mg/dL — ABNORMAL HIGH (ref 70–99)
Glucose-Capillary: 178 mg/dL — ABNORMAL HIGH (ref 70–99)

## 2019-10-31 LAB — GLUCOSE, CAPILLARY
Glucose-Capillary: 181 mg/dL — ABNORMAL HIGH (ref 70–99)
Glucose-Capillary: 176 mg/dL — ABNORMAL HIGH (ref 70–99)
Glucose-Capillary: 199 mg/dL — ABNORMAL HIGH (ref 70–99)
Glucose-Capillary: 196 mg/dL — ABNORMAL HIGH (ref 70–99)
Glucose-Capillary: 202 mg/dL — ABNORMAL HIGH (ref 70–99)
Glucose-Capillary: 205 mg/dL — ABNORMAL HIGH (ref 70–99)

## 2019-10-31 LAB — RESPIRATORY PANEL BY RT PCR (FLU A&B, COVID)
Influenza A by PCR: NEGATIVE
Influenza B by PCR: NEGATIVE
SARS Coronavirus 2 by RT PCR: NEGATIVE

## 2019-10-31 LAB — RAPID URINE DRUG SCREEN, HOSP PERFORMED
Amphetamines: NOT DETECTED
Barbiturates: NOT DETECTED
Benzodiazepines: NOT DETECTED
Cocaine: NOT DETECTED
Opiates: NOT DETECTED
Tetrahydrocannabinol: NOT DETECTED

## 2019-10-31 LAB — TROPONIN I (HIGH SENSITIVITY)
Troponin I (High Sensitivity): 8 ng/L (ref <18)
Troponin I (High Sensitivity): 18 ng/L — ABNORMAL HIGH (ref <18)

## 2019-10-31 LAB — PHOSPHORUS
Phosphorus: 6 mg/dL — ABNORMAL HIGH (ref 2.5–4.6)
Phosphorus: 4.7 mg/dL — ABNORMAL HIGH (ref 2.5–4.6)
Phosphorus: 2.7 mg/dL (ref 2.5–4.6)

## 2019-10-31 LAB — MAGNESIUM
Magnesium: 1.6 mg/dL — ABNORMAL LOW (ref 1.7–2.4)
Magnesium: 1.4 mg/dL — ABNORMAL LOW (ref 1.7–2.4)
Magnesium: 2.2 mg/dL (ref 1.7–2.4)

## 2019-10-31 LAB — MRSA PCR SCREENING: MRSA by PCR: NEGATIVE

## 2019-10-31 LAB — LACTIC ACID, PLASMA
Lactic Acid, Venous: 4.1 mmol/L (ref 0.5–1.9)
Lactic Acid, Venous: 1.2 mmol/L (ref 0.5–1.9)

## 2019-10-31 LAB — BRAIN NATRIURETIC PEPTIDE: B Natriuretic Peptide: 1339.2 pg/mL — ABNORMAL HIGH (ref 0.0–100.0)

## 2019-10-31 LAB — HCG, QUANTITATIVE, PREGNANCY: hCG, Beta Chain, Quant, S: 2 m[IU]/mL (ref <5)

## 2019-10-31 LAB — TSH: TSH: 1.904 u[IU]/mL (ref 0.350–4.500)

## 2019-10-31 MED ORDER — VITAL HIGH PROTEIN PO LIQD
1000.0000 mL | ORAL | Status: DC
  Administered 2019-10-31 – 2019-11-02 (×2): 1000 mL
  Filled 2019-10-31: qty 1000

## 2019-10-31 MED ORDER — CHLORHEXIDINE GLUCONATE CLOTH 2 % EX PADS
6.0000 | MEDICATED_PAD | Freq: Every day | CUTANEOUS | Status: DC
  Administered 2019-11-01 – 2019-11-04 (×4): 6 via TOPICAL

## 2019-10-31 MED ORDER — POTASSIUM CHLORIDE 10 MEQ/50ML IV SOLN
10.0000 meq | INTRAVENOUS | Status: AC
  Administered 2019-10-31 (×4): 10 meq via INTRAVENOUS
  Filled 2019-10-31 (×6): qty 50

## 2019-10-31 MED ORDER — SODIUM CHLORIDE 0.9 % IV SOLN
500.0000 mg | INTRAVENOUS | Status: DC
  Administered 2019-10-31 – 2019-11-01 (×2): 500 mg via INTRAVENOUS
  Filled 2019-10-31 (×3): qty 500

## 2019-10-31 MED ORDER — FENTANYL CITRATE (PF) 100 MCG/2ML IJ SOLN: 100.0000 ug | INTRAMUSCULAR | Status: DC | PRN

## 2019-10-31 MED ORDER — DEXTROSE-NACL 5-0.45 % IV SOLN: INTRAVENOUS | Status: DC

## 2019-10-31 MED ORDER — MIDAZOLAM HCL 2 MG/2ML IJ SOLN
1.0000 mg | INTRAMUSCULAR | Status: DC | PRN
  Administered 2019-10-31 – 2019-11-01 (×6): 2 mg via INTRAVENOUS
  Filled 2019-10-31 (×7): qty 2

## 2019-10-31 MED ORDER — NITROGLYCERIN IN D5W 200-5 MCG/ML-% IV SOLN
0.0000 ug/min | INTRAVENOUS | Status: DC
  Administered 2019-10-31: 10 ug/min via INTRAVENOUS
  Administered 2019-11-01 (×2): 20 ug/min via INTRAVENOUS
  Filled 2019-10-31 (×3): qty 250

## 2019-10-31 MED ORDER — FENTANYL CITRATE (PF) 100 MCG/2ML IJ SOLN
100.0000 ug | INTRAMUSCULAR | Status: AC | PRN
  Administered 2019-10-31 (×3): 100 ug via INTRAVENOUS
  Filled 2019-10-31 (×3): qty 2

## 2019-10-31 MED ORDER — ENOXAPARIN SODIUM 60 MG/0.6ML ~~LOC~~ SOLN
0.5000 mg/kg | SUBCUTANEOUS | Status: DC
  Administered 2019-10-31 – 2019-11-03 (×4): 55 mg via SUBCUTANEOUS
  Filled 2019-10-31 (×4): qty 0.55

## 2019-10-31 MED ORDER — FUROSEMIDE 10 MG/ML IJ SOLN
60.0000 mg | Freq: Once | INTRAMUSCULAR | Status: AC
  Administered 2019-10-31: 60 mg via INTRAVENOUS
  Filled 2019-10-31: qty 6

## 2019-10-31 MED ORDER — PRO-STAT SUGAR FREE PO LIQD
30.0000 mL | Freq: Two times a day (BID) | ORAL | Status: DC
  Administered 2019-10-31 – 2019-11-01 (×2): 30 mL
  Filled 2019-10-31 (×2): qty 30

## 2019-10-31 MED ORDER — PROPOFOL 1000 MG/100ML IV EMUL
INTRAVENOUS | Status: AC
  Filled 2019-10-31: qty 100

## 2019-10-31 MED ORDER — PROPOFOL 1000 MG/100ML IV EMUL
5.0000 ug/kg/min | INTRAVENOUS | Status: DC
  Administered 2019-10-31 – 2019-11-01 (×2): 30 ug/kg/min via INTRAVENOUS
  Administered 2019-11-01: 35 ug/kg/min via INTRAVENOUS
  Administered 2019-11-01 – 2019-11-02 (×3): 30 ug/kg/min via INTRAVENOUS
  Filled 2019-10-31 (×8): qty 100

## 2019-10-31 MED ORDER — SODIUM CHLORIDE 0.9 % IV SOLN
1.0000 g | INTRAVENOUS | Status: DC
  Administered 2019-10-31 – 2019-11-01 (×2): 1 g via INTRAVENOUS
  Filled 2019-10-31 (×3): qty 10

## 2019-10-31 MED ORDER — PROPOFOL 1000 MG/100ML IV EMUL
5.0000 ug/kg/min | INTRAVENOUS | Status: DC
  Administered 2019-10-31: 10 ug/kg/min via INTRAVENOUS
  Filled 2019-10-31: qty 100

## 2019-10-31 MED ORDER — FUROSEMIDE 10 MG/ML IJ SOLN
60.0000 mg | Freq: Two times a day (BID) | INTRAMUSCULAR | Status: DC
  Administered 2019-10-31 – 2019-11-02 (×5): 60 mg via INTRAVENOUS
  Filled 2019-10-31 (×5): qty 6

## 2019-10-31 MED ORDER — MAGNESIUM SULFATE 4 GM/100ML IV SOLN
4.0000 g | Freq: Once | INTRAVENOUS | Status: AC
  Administered 2019-10-31: 4 g via INTRAVENOUS
  Filled 2019-10-31: qty 100

## 2019-10-31 MED ORDER — INSULIN REGULAR(HUMAN) IN NACL 100-0.9 UT/100ML-% IV SOLN
INTRAVENOUS | Status: DC
  Administered 2019-10-31: 6.5 [IU]/h via INTRAVENOUS
  Administered 2019-11-01: 2.6 [IU]/h via INTRAVENOUS
  Filled 2019-10-31 (×2): qty 100

## 2019-10-31 MED ORDER — FENTANYL CITRATE (PF) 100 MCG/2ML IJ SOLN
INTRAMUSCULAR | Status: AC
  Filled 2019-10-31: qty 2

## 2019-10-31 MED ORDER — ROCURONIUM BROMIDE 50 MG/5ML IV SOLN
INTRAVENOUS | Status: AC | PRN
  Administered 2019-10-31: 100 mg via INTRAVENOUS

## 2019-10-31 MED ORDER — ETOMIDATE 2 MG/ML IV SOLN
INTRAVENOUS | Status: AC | PRN
  Administered 2019-10-31: 20 mg via INTRAVENOUS

## 2019-10-31 NOTE — Progress Notes (Signed)
Patient arrived to unit from ED. She is currently on Insulin drip at 2units. She has propofol at 5mcg. She intubated with OG tube.

## 2019-10-31 NOTE — Code Documentation (Signed)
EJ came out during intubation, EDP prepping for central line placement

## 2019-10-31 NOTE — ED Notes (Signed)
Family at bedside. Amy Chaney-- Nephew-- car keys and cell phone given to him, phone # [phone]- it is ok to call him with updates.

## 2019-10-31 NOTE — Progress Notes (Signed)
Echocardiogram 2D Echocardiogram has been performed.  Oneal Deputy Betina Puckett 10/31/2019, 9:46 AM   Dr. Stanford Breed notified of stat at 9:45

## 2019-10-31 NOTE — ED Provider Notes (Signed)
[facility] EMERGENCY DEPARTMENT Provider Note   CSN: [id number] Arrival date & time: 10/31/19  3086     History Chief Complaint  Patient presents with  . Respiratory Distress    Amy Chaney is a 49 y.o. female.  HPI Patient works as a Programmer, applications.  She was at a client's home when she started coughing and became extremely short of breath.  EMS was called.  EMS reports on arrival patient was alert and oriented but diaphoretic and very short of breath.  They got room air oxygen saturation of 90% and placed patient on CPAP.  Oxygen saturation went up to 96%.  Just upon arrival to the emergency department the patient went unresponsive and they reported oxygen saturation of 70%.  Patient arrived with a nonrebreather mask and extremely lethargic with spontaneous respirations.  No history can be obtained from the patient at this time.    Past Medical History:  Diagnosis Date  . Coronary artery disease involving native coronary artery of native heart with angina pectoris ([place]) 01/2013   a. s/p PCI of the mid RCA 01/2013 //  b. LHC 9/14: EF 55%, RCA stent 100% occluded, LAD irregularities >>  subacute stent thrombosis, Promus DES PCI distal overlap // c) 09/2017 - Ant STEMI - LAD-D2 PCI - Successful PCI of LAD (3 overlapping DES), unable to restore flow down D2l that was stented as well.  Likely related to downstream dissection, but unable to rewire.  . Daily headache   . Depression   . Dyslipidemia, goal LDL below 70   . History of Doppler ultrasound    carotid bruit >> a. Carotid US 6/17: bilat ICA 1-39%  . History of Takotsubo cardiomyopathy 11/2018   Admitted for acute on chronic combined systolic and diastolic heart failure.  EF down to 20-25% global hypokinesis.  Was in the setting of grieving over the loss of her son from MI.  ->  EF improved to 45-50% on follow-up echocardiogram.  . Hypertension   . Ischemic cardiomyopathy 06/2018   s/p inferior STEMI followed  by 2 anterior STEMIs: Following recovery from Takotsubo Cardiomyopathy- Echo 02/14/19: Moderate LVH.  EF improved up to 45-50%.  GR 1 DD.  Severe apical akinesis.  Normal valves.  . Mild dysplasia of cervix ([place]) 02/04/2019   Seen after colposcopy on 01/23/19 done for ASCU +HPV pap  > repeat pap and HPV test in 12 and 24 months  . STEMI involving left anterior descending coronary artery ([place]) 09/2017; 06/2018   a) Severe [place]ina 1, 1, 1 LAD-D2 lesion (complicated by post PTCA dissection/intramural hematoma) -successful extensive PCI of the LAD but unable to maintain patency of the stented D2. b) distal mLAD stent Edge 100% thrombosis --> PTCA & Overlapping DES PCI (Synergy 3 x 12 --> 3.6-3.3 mm). D2 occluded. RCA stents patent, 65 % OM2. EF 30-35%.  Marland Kitchen STEMI involving oth coronary artery of inferior wall ([place]) 03/2013   Secondary to subacute stent thrombosis of RCA stent segment having missed 4 doses of Effient.  . Tobacco abuse 01/11/2016  . Type II diabetes mellitus ([place]) 12/2010   sister and son also diabetic     Patient Active Problem List   Diagnosis Date Noted  . Trichomoniasis 03/30/2019  . Mild dysplasia of cervix ([place]) 02/04/2019  . Screening breast examination 01/16/2019  . Flash pulmonary edema ([place])   . Pulmonary edema 12/18/2018  . Atypical squamous cell changes of undetermined significance (ASCUS) on cervical cytology  with positive high risk human papilloma virus (HPV) 11/21/2018  . Acute on chronic combined systolic and diastolic CHF, NYHA class 3 ([place]) 07/05/2018  . Dyslipidemia, goal LDL below 70   . Acute ST elevation myocardial infarction (STEMI) involving left anterior descending (LAD) coronary artery ([place]) 07/03/2018  . Dark stools 04/03/2018  . Hypertension, accelerated, with diastolic congestive heart failure, NYHA class 1 ([place]) 12/30/2017  . STEMI involving left anterior descending coronary artery ([place]) 10/10/2017  . MI, acute, non ST segment elevation ([place])   . Chest  pain 01/11/2016  . Carotid artery disease ([place]) 01/11/2016  . Prolonged Q-T interval on ECG 01/11/2016  . Tobacco abuse counseling 01/11/2016  . Heart palpitations 01/11/2016  . Tobacco abuse 01/11/2016  . Morbid obesity ([place]): BMI ~36 with HTN, HLD, CAD 11/25/2015  . Smoker 03/03/2015  . Coronary artery disease involving native coronary artery of native heart with angina pectoris ([place]) 12/09/2014  . Chronic combined systolic (congestive) and diastolic (congestive) heart failure ([place]) 12/09/2014  . H/O noncompliance with medical treatment, presenting hazards to health 12/09/2014  . S/P primary angioplasty with coronary stent 11/18/2014  . Diabetes type 2, uncontrolled ([place]) 04/01/2013  . Hyperlipidemia associated with type 2 diabetes mellitus ([place]) 04/01/2013  . Depression 04/01/2013  . Stress at home 04/01/2013  . Essential hypertension 09/05/2011  . Type II diabetes mellitus ([place]) 12/30/2010    Past Surgical History:  Procedure Laterality Date  . CHOLECYSTECTOMY  ~ 2000  . CORONARY STENT INTERVENTION N/A 10/10/2017   Procedure: CORONARY STENT INTERVENTION;  Surgeon: Leonie Man, MD;  Location: [facility];  Service: Cardiovascular; LAD PCI: STENT SYNERGY DES 3.5X32 (p),STENT SYNERGY DES 3.5X 8 (m), STENT SYNERGY DES 3.5X28 (d).  D2 PCI: STENT SYNERGY DES 2.5X24.  (UNABL TO REWIRE & EXPAND - TO OF DIAG AT END OF CASE)  . CORONARY STENT INTERVENTION N/A 07/03/2018   Procedure: CORONARY STENT INTERVENTION;  Surgeon: Leonie Man, MD;  Location: [facility];; PTCA & overlapping DES PCI (overlaps prior stents distally) - Synergy DES 3 x 12 (3.6 mm @ overlap -- 3.3 mm distally)>  . LEFT HEART CATH AND CORONARY ANGIOGRAPHY N/A 10/10/2017   Procedure: LEFT HEART CATH AND CORONARY ANGIOGRAPHY;  Surgeon: Leonie Man, MD;  Location: [facility];  Service: Cardiovascular;  Laterality: N/A; -patent RCA stents with mild in-stent restenosis. Medina 1,1,1 mLD-D2 67%  (complicated by dissection/intramural thrombus)  . LEFT HEART CATH AND CORONARY ANGIOGRAPHY N/A 07/03/2018   Procedure: LEFT HEART CATH AND CORONARY ANGIOGRAPHY;  Surgeon: Leonie Man, MD;  Location: [facility];; 100% thrombotic occlusion distal LAD stent -- DES PCI. continued 100% D2 (previously lost). patent RCA stents, 65% OM2.  EF 30-35%.  Marland Kitchen LEFT HEART CATHETERIZATION WITH CORONARY ANGIOGRAM N/A 02/25/2013   Procedure: LEFT HEART CATHETERIZATION WITH CORONARY ANGIOGRAM;  Surgeon: Laverda Page, MD;  [facility];  Service: Cardiovascular;; CTO m RCA; otherwise normal coronaries  . LEFT HEART CATHETERIZATION WITH CORONARY ANGIOGRAM N/A 04/01/2013   Procedure: LEFT HEART CATHETERIZATION WITH CORONARY ANGIOGRAM;  Surgeon: Laverda Page, MD;  Location: [facility] CATH LAB;  Service: Cardiovascular: 100% occlusion of distal RCA stent (subacute stent thrombosis -  secondary to stopping Effient).  Overlapping DES PCI  . NM MYOVIEW LTD  12/2015    EF 38%.  Hypertensive response to exercise (231/132 mmHg).  INTERMEDIATE RISK due to -diffuse hypokinesis and reduced EF.  No ischemia or infarction.  Marland Kitchen PERCUTANEOUS CORONARY  STENT INTERVENTION (PCI-S)  04/01/2013   Procedure: PERCUTANEOUS CORONARY STENT INTERVENTION (PCI-S);  Surgeon: Laverda Page, MD;  Location[facility] CATH LAB;  Service: Cardiovascular;; PCI of distal RCA stent subacute thrombosis: Promus Premier DES 3.0 mm x 20 mm.  Marland Kitchen PERCUTANEOUS CORONARY STENT INTERVENTION (PCI-S)  02/25/2013   Procedure: PERCUTANEOUS CORONARY STENT INTERVENTION (PCI-S);  Surgeon: Laverda Page, MD;  Location: [facility] CATH LAB;  RCA CTO PCI with overlapping Promus DES: 3.0 mm x 38 mm, 3.5 mm x 43m  . TRANSTHORACIC ECHOCARDIOGRAM  07/04/2018   recurrent Anterior STEMI: EF 35-40%.  Apical hypokinesis.  GRII DD.  No thrombus noted.  (Improved EF from 30% up to 35-40% from previous echo)  . TRANSTHORACIC ECHOCARDIOGRAM  02/14/2019   Echo 02/14/19: Moderate  LVH.  EF improved up to 45-50%.  GR 1 DD.  Severe apical akinesis.  Normal valves.  . TRANSTHORACIC ECHOCARDIOGRAM  12/18/2018   TAKOTUSBO CM: EF 20-25%-diffuse HK..  Moderately increased LV thickness.  GRII DD with elevated LVEDP.  Severe LA dilation.  Suspected Takotsubo's cardiomyopathy.  . TUBAL LIGATION  1994     OB History    Gravida  3   Para  3   Term  3   Preterm  0   AB  0   Living  3     SAB  0   TAB  0   Ectopic  0   Multiple  0   Live Births              Family History  Problem Relation Age of Onset  . Diabetes Mother   . Diabetes Sister   . Diabetes Sister   . Diabetes Son        type 1  . Diabetes Son        type 2    Social History   Tobacco Use  . Smoking status: Current Every Day Smoker    Packs/day: 0.00    Years: 22.00    Pack years: 0.00    Types: Cigarettes  . Smokeless tobacco: Never Used  . Tobacco comment: pt states she smokes about 1-2 cigarettes per day--08/01/18  Substance Use Topics  . Alcohol use: Yes    Alcohol/week: 0.0 standard drinks    Comment: 07/04/2018 "only on special occasions; maybe once/yr"  . Drug use: Yes    Types: Marijuana    Comment: 02/25/2013 "quit marijuana ~ 2001"    Home Medications Prior to Admission medications   Medication Sig Start Date End Date Taking? Authorizing Provider  atorvastatin (LIPITOR) 80 MG tablet Take 1 tablet (80 mg total) by mouth daily. 02/24/19   LLendon Colonel NP  blood glucose meter kit and supplies Dispense based on patient and insurance preference. Use up to four times daily as directed. (FOR ICD-10 E10.9, E11.9). 07/06/18   Bhagat, BCrista Luria PA  Blood Glucose Monitoring Suppl (TRUE METRIX METER) DEVI 1 kit by Does not apply route 4 (four) times daily. 10/24/17   NBrayton Caves PA-C  carvedilol (COREG) 6.25 MG tablet Take 1 tablet (6.25 mg total) by mouth 2 (two) times daily. 06/19/19   HLeonie Man MD  furosemide (LASIX) 40 MG tablet Take 1 tablet (40 mg  total) by mouth daily. 02/24/19   LLendon Colonel NP  gabapentin (NEURONTIN) 300 MG capsule Take 1 capsule (300 mg total) by mouth at bedtime. 02/26/19   NCharlott Rakes MD  glipiZIDE (GLUCOTROL) 10 MG tablet Take 1 tablet (10 mg total)  by mouth 2 (two) times daily before a meal. 11/21/18   [name], MD  glucose blood test strip Use as instructed 12/31/18   [name], MD  Insulin Glargine (LANTUS SOLOSTAR) 100 UNIT/ML Solostar Pen Inject 30 Units into the skin daily. 02/26/19   [name], MD  metroNIDAZOLE (FLAGYL) 500 MG tablet Take 1 tablet (500 mg total) by mouth 2 (two) times daily. 03/30/19   Starr Lake, CNM  nitroGLYCERIN (NITROSTAT) 0.4 MG SL tablet Place 1 tablet (0.4 mg total) under the tongue every 5 (five) minutes as needed for chest pain. Reported on 11/25/2015 07/06/18   Leanor Kail, PA  sacubitril-valsartan (ENTRESTO) 49-51 MG Take 1 tablet by mouth 2 (two) times daily. 04/29/19   Leonie Man, MD  spironolactone (ALDACTONE) 25 MG tablet Take 0.5 tablets (12.5 mg total) by mouth daily. 12/20/18   Bensimhon, Shaune Pascal, MD  ticagrelor (BRILINTA) 60 MG TABS tablet Take 1 tablet (60 mg total) by mouth 2 (two) times daily. 06/19/19   Leonie Man, MD  TRUEPLUS LANCETS 28G MISC 28 g by Does not apply route 4 (four) times daily. 07/06/18   Mendel Corning, MD    Allergies    Patient has no known allergies.  Review of Systems   Review of Systems Level 5 caveat cannot obtain review of systems.  Patient extremitie Physical Exam Updated Vital Signs BP (!) 189/155   Pulse (!) 139   Resp (!) 21   Ht 5' 5"  (1.651 m)   Wt 110.7 kg   LMP 10/20/2015   SpO2 93%   BMI 40.61 kg/m   Physical Exam Constitutional:      Comments: Patient is on responsive and flaccid.  Listing in the stretcher with a nonrebreather mask on.  Slow, deep spontaneous respirations.  Pale and slightly diaphoretic.  HENT:     Head: Normocephalic and atraumatic.      Mouth/Throat:     Comments: Edentulous on the top.  Some teeth on the bottom incisors.  White frothy phlegm in the mouth. Eyes:     Comments: Pupils are midrange and sluggish.  Cardiovascular:     Comments: Distant heart sounds.  Tachycardic.  Distal pulses are strong pedal as well as radial. Pulmonary:     Comments: Coarse crackles bilateral lung fields. Abdominal:     Comments: Abdomen is obese.  Soft possibly slightly distended.  No rigidity.  Musculoskeletal:     Comments: No significant peripheral edema.  No no edema at the ankles or feet.  Dorsalis pedis pulses are 2+ and brisk.  Feet are warm to the touch.  Skin:    Comments: Skin is warm pale slightly diaphoretic  Neurological:     Comments: Patient is unresponsive.  No pain response.  Spontaneous respirations, slow and deep.     ED Results / Procedures / Treatments   Labs (all labs ordered are listed, but only abnormal results are displayed) Labs Reviewed  CBG MONITORING, ED - Abnormal; Notable for the following components:      Result Value   Glucose-Capillary 419 (*)    All other components within normal limits  CULTURE, BLOOD (ROUTINE X 2)  CULTURE, BLOOD (ROUTINE X 2)  RESPIRATORY PANEL BY RT PCR (FLU A&B, COVID)  COMPREHENSIVE METABOLIC PANEL  CBC WITH DIFFERENTIAL/PLATELET  BRAIN NATRIURETIC PEPTIDE  MAGNESIUM  PHOSPHORUS  HCG, QUANTITATIVE, PREGNANCY  TSH  LACTIC ACID, PLASMA  LACTIC ACID, PLASMA  RAPID URINE DRUG SCREEN, HOSP PERFORMED  URINALYSIS, ROUTINE  W REFLEX MICROSCOPIC  I-STAT ARTERIAL BLOOD GAS, ED  TROPONIN I (HIGH SENSITIVITY)    EKG EKG Interpretation  Date/Time:  Friday October 31 2019 08:50:26 EDT Ventricular Rate:  127 PR Interval:    QRS Duration: 99 QT Interval:  310 QTC Calculation: 451 R Axis:   86 Text Interpretation: Sinus tachycardia Anterior infarct, old Baseline wander in lead(s) V1 inferior and lateral ST depression compared to old Confirmed by Charlesetta Shanks [phone]) on  10/31/2019 9:03:26 AM   Radiology DG Chest Port 1 View  Result Date: 10/31/2019 CLINICAL DATA:  Hypoxia EXAM: PORTABLE CHEST 1 VIEW COMPARISON:  October 28, 2019 FINDINGS: Endotracheal tube tip is 2.9 cm above the carina. Nasogastric tube tip and side port are below the diaphragm. No pneumothorax. There is widespread airspace opacity throughout the lungs bilaterally, primarily in a perihilar type distribution. Heart is mildly enlarged with pulmonary vascularity normal. No adenopathy. No bone lesions. IMPRESSION: Tube positions as described without pneumothorax. Cardiomegaly. Perihilar airspace opacity likely represents alveolar edema bilaterally. A degree of superimposed pneumonia cannot be excluded. Etiology for the edema not certain, although a degree of congestive heart failure may well be present. Electronically Signed   By: Lowella Grip III M.D.   On: 10/31/2019 09:19   DG Abd Portable 1 View  Result Date: 10/31/2019 CLINICAL DATA:  Orogastric tube placed EXAM: PORTABLE ABDOMEN - 1 VIEW COMPARISON:  None. FINDINGS: Orogastric tube tip and side port are in the stomach. There is no bowel dilatation or air-fluid level to suggest bowel obstruction. No free air is evident on this supine examination. There are surgical clips in the right upper quadrant. IMPRESSION: Orogastric tube tip and side port in stomach. No overt bowel obstruction or free air evident. Electronically Signed   By: Lowella Grip III M.D.   On: 10/31/2019 09:20    Procedures Procedure Name: Intubation Date/Time: 10/31/2019 9:59 AM Performed by: Charlesetta Shanks, MD Pre-anesthesia Checklist: Patient identified, Patient being monitored, Emergency Drugs available and Suction available Oxygen Delivery Method: Ambu bag Preoxygenation: Pre-oxygenation with 100% oxygen Induction Type: Rapid sequence Ventilation: Mask ventilation without difficulty Laryngoscope Size: Glidescope and 3 Tube size: 7.5 mm Number of attempts:  1 Placement Confirmation: ETT inserted through vocal cords under direct vision,  CO2 detector and Breath sounds checked- equal and bilateral Tube secured with: ETT holder Dental Injury: Teeth and Oropharynx as per pre-operative assessment  Comments: Patient intubated without difficulty under direct visualization.  Frothy white secretions in the airway.      (including critical care time) CRITICAL CARE Performed by: Charlesetta Shanks   Total critical care time: 90 minutes  Critical care time was exclusive of separately billable procedures and treating other patients.  Critical care was necessary to treat or prevent imminent or life-threatening deterioration.  Critical care was time spent personally by me on the following activities: development of treatment plan with patient and/or surrogate as well as nursing, discussions with consultants, evaluation of patient's response to treatment, examination of patient, obtaining history from patient or surrogate, ordering and performing treatments and interventions, ordering and review of laboratory studies, ordering and review of radiographic studies, pulse oximetry and re-evaluation of patient's condition.  Angiocath insertion Performed by: Charlesetta Shanks  Consent: Verbal consent obtained. Risks and benefits: risks, benefits and alternatives were discussed Time out: Immediately prior to procedure a "time out" was called to verify the correct patient, procedure, equipment, support staff and site/side marked as required.  Preparation: Patient was prepped and draped in the usual  sterile fashion.  Vein Location: Left external jugular  No ultrasound Guided  Gauge: 20 long  Normal blood return and flush without difficulty Patient tolerance: Patient tolerated the procedure well with no immediate complications.  This IV had good blood flow and return.  Accidentally pulled out after intubation.  Second IV placed by myself ultrasound-guided right  basilic.   Angiocath insertion Performed by: Charlesetta Shanks  Consent: Verbal consent obtained. Risks and benefits: risks, benefits and alternatives were discussed Time out: Immediately prior to procedure a "time out" was called to verify the correct patient, procedure, equipment, support staff and site/side marked as required.  Preparation: Patient was prepped and draped in the usual sterile fashion.  Vein Location: Right basilic  Yes ultrasound Guided  Gauge: 20 long  Normal blood return and flush without difficulty Patient tolerance: Patient tolerated the procedure well with no immediate complications.    Medications Ordered in ED Medications  etomidate (AMIDATE) injection (20 mg Intravenous Given 10/31/19 0843)  rocuronium (ZEMURON) injection (100 mg Intravenous Given 10/31/19 0843)  propofol (DIPRIVAN) 1000 MG/100ML infusion (has no administration in time range)    ED Course  I have reviewed the triage vital signs and the nursing notes.  Pertinent labs & imaging results that were available during my care of the patient were reviewed by me and considered in my medical decision making (see chart for details).  Clinical Course as of Oct 30 940  Fri Oct 31, 2019  [id number] Consult: Critical care E link has returned call and will have team evaluate the patient.   [MP]    Clinical Course User Index [MP] Charlesetta Shanks, MD   MDM Rules/Calculators/A&P                     Patient presents outlined above.  EMR indicates severe cardiac disease with coronary artery disease and cardiomyopathy.  History and physical consistent with flash pulmonary edema.  Patient's EKG shows ST depressions inferior and lateral.  Troponin is still pending but MI is within differential.  Notably patient presented to the emergency department 2 days ago and there is a triage note but patient was not seen and evaluated by MD.  Chest x-ray was clear at that time.  Today's chest x-ray shows extensive vascular  congestion.  Ostensibly symptoms were very quick in onset.  Patient has been started on a nitroglycerin drip.  Blood pressures are trending down.  Lasix IV has been administered.  Consultation placed to intensivist team.  Cardiology consult is pending.  Bedside echo has been completed.  Intensivist team here at this time initiating care. Final Clinical Impression(s) / ED Diagnoses Final diagnoses:  Acute respiratory failure with hypoxia ([place])  Hypertensive emergency  Severe comorbid illness    Rx / DC Orders ED Discharge Orders    None       Charlesetta Shanks, MD 10/31/19 1011

## 2019-10-31 NOTE — ED Notes (Signed)
CRITICAL CARE AT THE BEDSIDE

## 2019-10-31 NOTE — Consult Note (Addendum)
Cardiology Consultation:   Patient ID: Amy Chaney; [id number]; 1970-12-20   Admit date: 10/31/2019 Date of Consult: 10/31/2019  Primary Care Provider: [name], MD Primary Cardiologist: Glenetta Hew, MD Primary Electrophysiologist:  None   Patient Profile:   Amy Chaney is a 49 y.o. female with a PMH of CAD s/p PCI to LAD x3/ diagonal in 2019 and RCA x2 in 2014, chronic combined CHF/ischemic cardiomyopathy, takotsubo cardiomyopathy, HTN, HLD,  DM type 2, obesity, and tobacco abuse, who is being seen today for the evaluation of respiratory arrest at the request of Dr. Lamonte Sakai.  History of Present Illness:   Ms. Kangas is currently intubated and sedated, therefore majority of information is obtained from chart review. She was reported to have been feeling poorly for several days leading up to this admission with complaints of fatigue, SOB, and left-sided chest pain radiating to her left arm. It appears she presented to the ED 10/29/19 for these complaints, however she left before being evaluated due to the long wait. She works as a Occupational psychologist and was at a patient's house when her symptoms progressively worsened until 10/31/19 when she began experiencing sudden onset SOB. EMS was activated and she was found to have O2 sats in the 80s, improved with CPAP.   On arrival to the ED she developed worsening hypoxia and became unresponsive. Blood gas showed respiratory acidosis. She was intubated in the ED. Stat echo showed EF 20-25%, global hypokinesis, G3DD, mild LVH, moderately elevated PA pressures, mild LAE mild MR, and mildly elevated RA pressures. EKG with sinus tachycardia, rate 127, chronic inferolateral STD, old anterior infarct, no STE. Utox negative. Trop 8>18. BG 517 on arrival, started on an insulin gtt. Found to have a leukocytosis with WBC 16 with ?PNA on CXR, started on empiric IV antibiotics. She was given IV lasix 71m for flash pulmonary edema with UOP -1.3L. She was initially  hypertensive to 189/155, started on a nitro gtt, now with soft BP's. Admitted to PCCM. Cardiology asked to evaluate.   She had a similar presentation to her current admission 11/2018, when she presented with flash pulmonary edema in the setting of hypertensive crisis requiring intubation. EF was found to be 25% at that time, down from 35-40% in 2019. It was felt that her presentation may have been related to Takotsubo cardiomyopathy given recent stress to her 267yo son passing away from an MI. Her trops were negative and she did not have an ischemic evaluation at that time.   She has a complicated cardiac history. She had a STEMI in 2014 with PCI to RCA 01/2013, followed by in-stent thrombosis 03/2013 managed with overlapping stents. 2nd STEMI occurred 09/2017 with bifurcation LAD-D2 lesion with complicated PCI/DES to LAD with ultimate loss of D2, followed by distal in-stent thrombosis 06/2018 managed with overlapping PCI/DES to LAD.  She had an ischemic cardiomyopathy - EF 45-50% in 2016, improved to 55-60% in 2017, back down to 35% in 2019, then 20-25% 11/2018, up to 45-50% 01/2019, and now back down to 20-25%.   At her last visit with Dr. HEllyn Hack11/2020, she was transitioned to maintenance dosing of brilinta, 657mBID to start 07/2019 at which point she could stop aspirin. Her carvedilol was increased to 6.2590mID for improve blood pressure control, with plans to consider uptitration of entresto to max dose if BP still poorly controlled. It appears she may have had a telemedicine visit with KatJory Sims22/21, though the note is incomplete.   Past  Medical History:  Diagnosis Date  . Coronary artery disease involving native coronary artery of native heart with angina pectoris ([place]) 01/2013   a. s/p PCI of the mid RCA 01/2013 //  b. LHC 9/14: EF 55%, RCA stent 100% occluded, LAD irregularities >>  subacute stent thrombosis, Promus DES PCI distal overlap // c) 09/2017 - Ant STEMI - LAD-D2 PCI - Successful  PCI of LAD (3 overlapping DES), unable to restore flow down D2l that was stented as well.  Likely related to downstream dissection, but unable to rewire.  . Daily headache   . Depression   . Dyslipidemia, goal LDL below 70   . History of Doppler ultrasound    carotid bruit >> a. Carotid US 6/17: bilat ICA 1-39%  . History of Takotsubo cardiomyopathy 11/2018   Admitted for acute on chronic combined systolic and diastolic heart failure.  EF down to 20-25% global hypokinesis.  Was in the setting of grieving over the loss of her son from MI.  ->  EF improved to 45-50% on follow-up echocardiogram.  . Hypertension   . Ischemic cardiomyopathy 06/2018   s/p inferior STEMI followed by 2 anterior STEMIs: Following recovery from Takotsubo Cardiomyopathy- Echo 02/14/19: Moderate LVH.  EF improved up to 45-50%.  GR 1 DD.  Severe apical akinesis.  Normal valves.  . Mild dysplasia of cervix (CIN I) 02/04/2019   Seen after colposcopy on 01/23/19 done for ASCU +HPV pap  > repeat pap and HPV test in 12 and 24 months  . STEMI involving left anterior descending coronary artery ([place]) 09/2017; 06/2018   a) Severe Medina 1, 1, 1 LAD-D2 lesion (complicated by post PTCA dissection/intramural hematoma) -successful extensive PCI of the LAD but unable to maintain patency of the stented D2. b) distal mLAD stent Edge 100% thrombosis --> PTCA & Overlapping DES PCI (Synergy 3 x 12 --> 3.6-3.3 mm). D2 occluded. RCA stents patent, 65 % OM2. EF 30-35%.  Marland Kitchen STEMI involving oth coronary artery of inferior wall ([place]) 03/2013   Secondary to subacute stent thrombosis of RCA stent segment having missed 4 doses of Effient.  . Tobacco abuse 01/11/2016  . Type II diabetes mellitus ([place]) 12/2010   sister and son also diabetic     Past Surgical History:  Procedure Laterality Date  . CHOLECYSTECTOMY  ~ 2000  . CORONARY STENT INTERVENTION N/A 10/10/2017   Procedure: CORONARY STENT INTERVENTION;  Surgeon: Leonie Man, MD;  Location: [facility];  Service: Cardiovascular; LAD PCI: STENT SYNERGY DES 3.5X32 (p),STENT SYNERGY DES 3.5X 8 (m), STENT SYNERGY DES 3.5X28 (d).  D2 PCI: STENT SYNERGY DES 2.5X24.  (UNABL TO REWIRE & EXPAND - TO OF DIAG AT END OF CASE)  . CORONARY STENT INTERVENTION N/A 07/03/2018   Procedure: CORONARY STENT INTERVENTION;  Surgeon: Leonie Man, MD;  Location: [facility]ASIVE [facility] LAB;; PTCA & overlapping DES PCI (overlaps prior stents distally) - Synergy DES 3 x 12 (3.6 mm @ overlap -- 3.3 mm distally)>  . LEFT HEART CATH AND CORONARY ANGIOGRAPHY N/A 10/10/2017   Procedure: LEFT HEART CATH AND CORONARY ANGIOGRAPHY;  Surgeon: Leonie Man, MD;  Location: [facility];  Service: Cardiovascular;  Laterality: N/A; -patent RCA stents with mild in-stent restenosis. Medina 1,1,1 mLD-D2 30% (complicated by dissection/intramural thrombus)  . LEFT HEART CATH AND CORONARY ANGIOGRAPHY N/A 07/03/2018   Procedure: LEFT HEART CATH AND CORONARY ANGIOGRAPHY;  Surgeon: Leonie Man, MD;  Location: [facility] INV[facility] LAB;; 100% thrombotic occlusion  distal LAD stent -- DES PCI. continued 100% D2 (previously lost). patent RCA stents, 65% OM2.  EF 30-35%.  Marland Kitchen LEFT HEART CATHETERIZATION WITH CORONARY ANGIOGRAM N/A 02/25/2013   Procedure: LEFT HEART CATHETERIZATION WITH CORONARY ANGIOGRAM;  Surgeon: Laverda Page, MD;  Location: [facility] CATH LAB;  Service: Cardiovascular;; CTO m RCA; otherwise normal coronaries  . LEFT HEART CATHETERIZATION WITH CORONARY ANGIOGRAM N/A 04/01/2013   Procedure: LEFT HEART CATHETERIZATION WITH CORONARY ANGIOGRAM;  Surgeon: Laverda Page, MD;  Location: [facility] CATH LAB;  Service: Cardiovascular: 100% occlusion of distal RCA stent (subacute stent thrombosis -  secondary to stopping Effient).  Overlapping DES PCI  . NM MYOVIEW LTD  12/2015    EF 38%.  Hypertensive response to exercise (231/132 mmHg).  INTERMEDIATE RISK due to -diffuse hypokinesis and reduced EF.  No ischemia or infarction.  Marland Kitchen  PERCUTANEOUS CORONARY STENT INTERVENTION (PCI-S)  04/01/2013   Procedure: PERCUTANEOUS CORONARY STENT INTERVENTION (PCI-S);  Surgeon: Laverda Page, MD;  Location: [facility] CATH LAB;  Service: Cardiovascular;; PCI of distal RCA stent subacute thrombosis: Promus Premier DES 3.0 mm x 20 mm.  Marland Kitchen PERCUTANEOUS CORONARY STENT INTERVENTION (PCI-S)  02/25/2013   Procedure: PERCUTANEOUS CORONARY STENT INTERVENTION (PCI-S);  Surgeon: Laverda Page, MD;  Location: [facility] CATH LAB;  RCA CTO PCI with overlapping Promus DES: 3.0 mm x 38 mm, 3.5 mm x 14m  . TRANSTHORACIC ECHOCARDIOGRAM  07/04/2018   recurrent Anterior STEMI: EF 35-40%.  Apical hypokinesis.  GRII DD.  No thrombus noted.  (Improved EF from 30% up to 35-40% from previous echo)  . TRANSTHORACIC ECHOCARDIOGRAM  02/14/2019   Echo 02/14/19: Moderate LVH.  EF improved up to 45-50%.  GR 1 DD.  Severe apical akinesis.  Normal valves.  . TRANSTHORACIC ECHOCARDIOGRAM  12/18/2018   TAKOTUSBO CM: EF 20-25%-diffuse HK..  Moderately increased LV thickness.  GRII DD with elevated LVEDP.  Severe LA dilation.  Suspected Takotsubo's cardiomyopathy.  . TUBAL LIGATION  1994     Home Medications:  Prior to Admission medications   Medication Sig Start Date End Date Taking? Authorizing Provider  atorvastatin (LIPITOR) 40 MG tablet Take 40 mg by mouth 2 (two) times daily.    Yes [provider]  carvedilol (COREG) 6.25 MG tablet Take 1 tablet (6.25 mg total) by mouth 2 (two) times daily. 06/19/19  Yes HLeonie Man MD  furosemide (LASIX) 40 MG tablet Take 1 tablet (40 mg total) by mouth daily. Patient taking differently: Take 20 mg by mouth daily.  02/24/19  Yes LLendon Colonel NP  glipiZIDE (GLUCOTROL) 10 MG tablet Take 1 tablet (10 mg total) by mouth 2 (two) times daily before a meal. 11/21/18  Yes Newlin, Enobong, MD  Insulin Glargine (LANTUS SOLOSTAR) 100 UNIT/ML Solostar Pen Inject 30 Units into the skin daily. 02/26/19  Yes Newlin, ECharlane Ferretti MD    sacubitril-valsartan (ENTRESTO) 24-26 MG Take 1 tablet by mouth 2 (two) times daily.   Yes [provider]  spironolactone (ALDACTONE) 25 MG tablet Take 0.5 tablets (12.5 mg total) by mouth daily. 12/20/18  Yes Bensimhon, DShaune Pascal MD  ticagrelor (BRILINTA) 60 MG TABS tablet Take 1 tablet (60 mg total) by mouth 2 (two) times daily. 06/19/19  Yes HLeonie Man MD  atorvastatin (LIPITOR) 80 MG tablet Take 1 tablet (80 mg total) by mouth daily. Patient not taking: Reported on 10/31/2019 02/24/19   LLendon Colonel NP  blood glucose meter kit and supplies Dispense based on patient and insurance preference. Use  up to four times daily as directed. (FOR ICD-10 E10.9, E11.9). 07/06/18   Bhagat, Crista Luria, PA  Blood Glucose Monitoring Suppl (TRUE METRIX METER) DEVI 1 kit by Does not apply route 4 (four) times daily. 10/24/17   Brayton Caves, PA-C  gabapentin (NEURONTIN) 300 MG capsule Take 1 capsule (300 mg total) by mouth at bedtime. Patient not taking: Reported on 10/31/2019 02/26/19   [name], MD  glucose blood test strip Use as instructed 12/31/18   [name], MD  nitroGLYCERIN (NITROSTAT) 0.4 MG SL tablet Place 1 tablet (0.4 mg total) under the tongue every 5 (five) minutes as needed for chest pain. Reported on 11/25/2015 07/06/18   Leanor Kail, PA  sacubitril-valsartan (ENTRESTO) 49-51 MG Take 1 tablet by mouth 2 (two) times daily. Patient not taking: Reported on 10/31/2019 04/29/19   Leonie Man, MD  TRUEPLUS LANCETS 28G MISC 28 g by Does not apply route 4 (four) times daily. 07/06/18   Rai, Vernelle Emerald, MD    Inpatient Medications: Scheduled Meds: . enoxaparin (LOVENOX) injection  40 mg Subcutaneous Q24H  . feeding supplement (PRO-STAT SUGAR FREE 64)  30 mL Per Tube BID  . feeding supplement (VITAL HIGH PROTEIN)  1,000 mL Per Tube Q24H  . fentaNYL       Continuous Infusions: . azithromycin    . cefTRIAXone (ROCEPHIN)  IV    . insulin 6.5 Units/hr (10/31/19  1159)  . nitroGLYCERIN 20 mcg/min (10/31/19 1054)   PRN Meds: fentaNYL (SUBLIMAZE) injection, fentaNYL (SUBLIMAZE) injection, midazolam  Allergies:   No Known Allergies  Social History:   Social History   Socioeconomic History  . Marital status: Single    Spouse name: Not on file  . Number of children: 3  . Years of education: Not on file  . Highest education level: 11th grade  Occupational History    Employer: [employer]  Tobacco Use  . Smoking status: Current Every Day Smoker    Packs/day: 0.00    Years: 22.00    Pack years: 0.00    Types: Cigarettes  . Smokeless tobacco: Never Used  . Tobacco comment: pt states she smokes about 1-2 cigarettes per day--08/01/18  Substance and Sexual Activity  . Alcohol use: Yes    Alcohol/week: 0.0 standard drinks    Comment: 07/04/2018 "only on special occasions; maybe once/yr"  . Drug use: Yes    Types: Marijuana    Comment: 02/25/2013 "quit marijuana ~ 2001"  . Sexual activity: Not Currently    Birth control/protection: Surgical  Other Topics Concern  . Not on file  Social History Narrative  . Not on file   Social Determinants of Health   Financial Resource Strain:   . Difficulty of Paying Living Expenses:   Food Insecurity:   . Worried About Charity fundraiser in the Last Year:   . Arboriculturist in the Last Year:   Transportation Needs: No Transportation Needs  . Lack of Transportation (Medical): No  . Lack of Transportation (Non-Medical): No  Physical Activity:   . Days of Exercise per Week:   . Minutes of Exercise per Session:   Stress:   . Feeling of Stress :   Social Connections:   . Frequency of Communication with Friends and Family:   . Frequency of Social Gatherings with Friends and Family:   . Attends Religious Services:   . Active Member of Clubs or Organizations:   . Attends Archivist Meetings:   Marland Kitchen Marital  Status:   Intimate Partner Violence:   . Fear of Current or Ex-Partner:   .  Emotionally Abused:   Marland Kitchen Physically Abused:   . Sexually Abused:     Family History:    Family History  Problem Relation Age of Onset  . Diabetes Mother   . Diabetes Sister   . Diabetes Sister   . Diabetes Son        type 1  . Diabetes Son        type 2     ROS:  Please see the history of present illness.  ROS  All other ROS reviewed and negative.     Physical Exam/Data:   Vitals:   10/31/19 1236 10/31/19 1245 10/31/19 1255 10/31/19 1300  BP: (!) 99/40 (!) 88/47 99/71   Pulse: 82     Resp: (!) 24 (!) 24 (!) 24 (!) 24  Temp:  (!) 96.8 F (36 C) (!) 96.9 F (36.1 C) (!) 96.9 F (36.1 C)  SpO2: 100%     Weight:      Height:        Intake/Output Summary (Last 24 hours) at 10/31/2019 1303 Last data filed at 10/31/2019 1046 Gross per 24 hour  Intake --  Output 1300 ml  Net -1300 ml   Filed Weights   10/31/19 0858  Weight: 110.7 kg   Body mass index is 40.61 kg/m.  General:  Critically ill obese female, intubated and sedated HEENT: sclera anicteric  Neck: right IJ in place  Vascular: No carotid bruits; distal pulses 2+ bilaterally Cardiac:  normal S1, S2; RRR; no murmurs, rubs, or gallops appreciated Lungs:  Mechanical ventilation, coarse breath sounds  Abd: NABS, soft, nontender, no hepatomegaly Ext: trace-1+ LE edema Musculoskeletal:  No deformities, BUE and BLE strength normal and equal Skin: warm and dry  Neuro:  Unresponsive; occasional movement of bilateral arms Psych:  Normal affect   EKG:  The EKG was personally reviewed and demonstrates:  sinus tachycardia, rate 127, chronic inferolateral STD, old anterior infarct, no STE.  Relevant CV Studies:  Echocardiogram 10/31/19: 1. Severe global reduction in LV systolic function; mild LVH; mild LVE;  restrictive filling; mild MR and TR; mild LAE; moderate pulmonary  hypertension.  2. Left ventricular ejection fraction, by estimation, is 20 to 25%. The  left ventricle has severely decreased function. The  left ventricle  demonstrates global hypokinesis. The left ventricular internal cavity size  was mildly dilated. There is mild left  ventricular hypertrophy. Left ventricular diastolic parameters are  consistent with Grade III diastolic dysfunction (restrictive).  3. Right ventricular systolic function is normal. The right ventricular  size is normal. There is moderately elevated pulmonary artery systolic  pressure.  4. Left atrial size was mildly dilated.  5. The mitral valve is normal in structure. Mild mitral valve  regurgitation. No evidence of mitral stenosis.  6. The aortic valve is tricuspid. Aortic valve regurgitation is not  visualized. No aortic stenosis is present.  7. The inferior vena cava is normal in size with <50% respiratory  variability, suggesting right atrial pressure of 8 mmHg.   Echocardiogram 01/2019: 1. The left ventricle has mildly reduced systolic function, with an  ejection fraction of 45-50%. The cavity size was normal. There is  moderately increased left ventricular wall thickness. Left ventricular  diastolic Doppler parameters are consistent with  impaired relaxation. Indeterminate filling pressures The E/e' is 8-15.  2. Severe akinesis of the left ventricular, entire apical segment.  3. The  mitral valve is abnormal. Mild thickening of the mitral valve  leaflet.  4. The aortic valve is tricuspid. No stenosis of the aortic valve.  5. Left atrial size was moderately dilated.  6. The right ventricle has normal systolic function. The cavity was  normal. There is no increase in right ventricular wall thickness.  7. The ascending aorta and aortic root are normal in size and structure.  8. The tricuspid valve is grossly normal.  9. When compared to the prior study: 12/18/2018: LVEF 20-25%.   SUMMARY    LVEF 45-50%, apical akinesis, moderate LVH, grade 1 DD,  indeterminate LV filling pressure, moderaet LAE, trivial TR, normal  RVSP, normal IVC    Left heart catheterization 06/2018:  Previously placed Prox LAD to Mid LAD drug eluting stents are widely patent up until the distal edge..  Mid LAD lesion is 100% stenosed at the distal edge of the most distal stent.  A drug-eluting stent was successfully placed using a STENT SYNERGY DES 3X12.  Post intervention, there is a 0% residual stenosis.  Balloon angioplasty was performed in the apical vessel just to help move distal embolization -allowing for flow down to the inferoapex.Colon Flattery 2nd Diag lesion is 100% stenosed. Known occluded stent from previous PCI  Prox RCA to Mid RCA stents are 5% stenosed.  Mid RCA to Dist RCA lesion is 40% stenosed.  Ost 2nd Mrg to 2nd Mrg lesion is 65% stenosed.  There is moderate to severe left ventricular systolic dysfunction.  LV end diastolic pressure is normal.  The left ventricular ejection fraction is 25-35% by visual estimate.  There is trivial (1+) mitral regurgitation.  There is no aortic valve stenosis.  Post intervention, there is a 0% residual stenosis.  Mid LAD lesion is 100% stenosed.   SUMMARY:  Distal stent edge 100% thrombosis (in the mid LAD) as culprit for anterior STEMI: Treated with PTCA and overlapping DES stent (synergy 3.0 mm x 12 mm postdilated to 3.6 mm in the overlap and 3.3 mm distally.  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  (EF roughly 30 to 35%)  RECOMMENDATION:  Admit to CVICU for ongoing care.  Will run Aggrastat for 3 hours post PCI.  Long-term dual endplate therapy.  Currently aspirin and Brilinta, had been on Effient, but did not afford.  Would be okay to use Effient, Brilinta or Plavix as long she will be on it.  Uninterrupted for 1 year, would be okay to stop Plavix for 3 months if on Effient or Brilinta, at 6 months if on Plavix.  Restart home medications  including carvedilol, lisinopril, atorvastatin  Sliding scale insulin for now.  Check A1c.  Check 2D echo to better assess EF.  Would not anticipate fast track discharge based on anterior MI with reduced EF.  Case management or social work consultation to help ensure that she is able to get her medications and not miss doses.  Needs lots of education.    Laboratory Data:  Chemistry Recent Labs  Lab 10/28/19 1540 10/28/19 1540 10/31/19 0858 10/31/19 0923 10/31/19 1000 10/31/19 1114  NA 138   < > 136 139 137  --   K 3.9   < > 3.7 4.1 4.2  --   CL 104  --  103 104  --   --   CO2 23  --  18*  --   --   --  GLUCOSE 346*  --  504* 517*  --   --   BUN 10  --  12 15  --   --   CREATININE 0.81   < > 1.04* 0.80  --  1.03*  CALCIUM 9.0  --  8.4*  --   --   --   GFRNONAA >60  --  >60  --   --  >60  GFRAA >60  --  >60  --   --  >60  ANIONGAP 11  --  15  --   --   --    < > = values in this interval not displayed.    Recent Labs  Lab 10/31/19 0858  PROT 7.6  ALBUMIN 3.2*  AST 32  ALT 17  ALKPHOS 45  BILITOT 1.0   Hematology Recent Labs  Lab 10/28/19 1540 10/28/19 1540 10/31/19 0858 10/31/19 0858 10/31/19 0923 10/31/19 1000 10/31/19 1114  WBC 11.6*  --  16.0*  --   --   --  18.0*  RBC 4.36  --  4.71  --   --   --  4.71  HGB 11.5*   < > 12.3   < > 13.3 14.3 12.5  HCT 36.8   < > 42.1   < > 39.0 42.0 40.7  MCV 84.4  --  89.4  --   --   --  86.4  MCH 26.4  --  26.1  --   --   --  26.5  MCHC 31.3  --  29.2*  --   --   --  30.7  RDW 14.3  --  14.4  --   --   --  14.3  PLT 352  --  428*  --   --   --  403*   < > = values in this interval not displayed.   Cardiac EnzymesNo results for input(s): TROPONINI in the last 168 hours. No results for input(s): TROPIPOC in the last 168 hours.  BNP Recent Labs  Lab 10/31/19 0858  BNP 1,339.2*    DDimer No results for input(s): DDIMER in the last 168 hours.  Radiology/Studies:  DG Chest 2 View  Result Date:  10/28/2019 CLINICAL DATA:  Chest pain for 1 day, ischemic cardiomyopathy, tobacco abuse EXAM: CHEST - 2 VIEW COMPARISON:  12/19/2018 FINDINGS: Frontal and lateral views of the chest demonstrate a stable cardiac silhouette. Diffuse interstitial prominence likely related to chronic changes related to tobacco abuse. No airspace disease, effusion, or pneumothorax. No acute bony abnormalities. IMPRESSION: 1. Chronic interstitial lung disease.  No acute process. Electronically Signed   By: Randa Ngo M.D.   On: 10/28/2019 18:52   DG Chest Portable 1 View  Result Date: 10/31/2019 CLINICAL DATA:  Central line placement EXAM: PORTABLE CHEST 1 VIEW COMPARISON:  10/31/2019 at 0903 hours FINDINGS: Interval placement of a right IJ approach central venous catheter with distal tip terminating at the level of the distal SVC. Endotracheal tube terminates 4.5 cm superior to the carina. Enteric tube courses below the diaphragm with distal tip within the expected location of the gastric body. Stable mild cardiomegaly. Extensive bilateral airspace opacities, similar to prior. No pneumothorax. IMPRESSION: 1. Interval placement of a right IJ approach central venous catheter with distal tip terminating at the level of the distal SVC. No pneumothorax. 2. Otherwise stable exam. Electronically Signed   By: Davina Poke D.O.   On: 10/31/2019 10:35   DG Chest Port 1 View  Result Date: 10/31/2019  CLINICAL DATA:  Hypoxia EXAM: PORTABLE CHEST 1 VIEW COMPARISON:  October 28, 2019 FINDINGS: Endotracheal tube tip is 2.9 cm above the carina. Nasogastric tube tip and side port are below the diaphragm. No pneumothorax. There is widespread airspace opacity throughout the lungs bilaterally, primarily in a perihilar type distribution. Heart is mildly enlarged with pulmonary vascularity normal. No adenopathy. No bone lesions. IMPRESSION: Tube positions as described without pneumothorax. Cardiomegaly. Perihilar airspace opacity likely represents  alveolar edema bilaterally. A degree of superimposed pneumonia cannot be excluded. Etiology for the edema not certain, although a degree of congestive heart failure may well be present. Electronically Signed   By: Lowella Grip III M.D.   On: 10/31/2019 09:19   DG Abd Portable 1 View  Result Date: 10/31/2019 CLINICAL DATA:  Orogastric tube placed EXAM: PORTABLE ABDOMEN - 1 VIEW COMPARISON:  None. FINDINGS: Orogastric tube tip and side port are in the stomach. There is no bowel dilatation or air-fluid level to suggest bowel obstruction. No free air is evident on this supine examination. There are surgical clips in the right upper quadrant. IMPRESSION: Orogastric tube tip and side port in stomach. No overt bowel obstruction or free air evident. Electronically Signed   By: Lowella Grip III M.D.   On: 10/31/2019 09:20   ECHOCARDIOGRAM COMPLETE  Result Date: 10/31/2019    ECHOCARDIOGRAM REPORT   Patient Name:   MAHATHI POKORNEY Date of Exam: 10/31/2019 Medical Rec #:  [id number]        Height:       65.0 in Accession #:    [id number]       Weight:       244.0 lb Date of Birth:  1971/01/03         BSA:          2.153 m Patient Age:    28 years         BP:           142/75 mmHg Patient Gender: F                HR:           119 bpm. Exam Location:  Inpatient Procedure: 2D Echo, Color Doppler and Cardiac Doppler STAT ECHO Indications:    Respiratory Arrest  History:        Patient has prior history of Echocardiogram examinations, most                 recent 02/14/2019. CHF, CAD; Risk Factors:Hypertension, Diabetes                 and Dyslipidemia.  Sonographer:    [name] Senior RDCS Referring Phys: [id number] [place]  1. Severe global reduction in LV systolic function; mild LVH; mild LVE; restrictive filling; mild MR and TR; mild LAE; moderate pulmonary hypertension.  2. Left ventricular ejection fraction, by estimation, is 20 to 25%. The left ventricle has severely decreased function. The left  ventricle demonstrates global hypokinesis. The left ventricular internal cavity size was mildly dilated. There is mild left ventricular hypertrophy. Left ventricular diastolic parameters are consistent with Grade III diastolic dysfunction (restrictive).  3. Right ventricular systolic function is normal. The right ventricular size is normal. There is moderately elevated pulmonary artery systolic pressure.  4. Left atrial size was mildly dilated.  5. The mitral valve is normal in structure. Mild mitral valve regurgitation. No evidence of mitral stenosis.  6. The aortic valve is tricuspid. Aortic valve regurgitation is  not visualized. No aortic stenosis is present.  7. The inferior vena cava is normal in size with <50% respiratory variability, suggesting right atrial pressure of 8 mmHg. FINDINGS  Left Ventricle: Left ventricular ejection fraction, by estimation, is 20 to 25%. The left ventricle has severely decreased function. The left ventricle demonstrates global hypokinesis. The left ventricular internal cavity size was mildly dilated. There is mild left ventricular hypertrophy. Left ventricular diastolic parameters are consistent with Grade III diastolic dysfunction (restrictive). Right Ventricle: The right ventricular size is normal.Right ventricular systolic function is normal. There is moderately elevated pulmonary artery systolic pressure. The tricuspid regurgitant velocity is 3.20 m/s, and with an assumed right atrial pressure of 8 mmHg, the estimated right ventricular systolic pressure is 31.5 mmHg. Left Atrium: Left atrial size was mildly dilated. Right Atrium: Right atrial size was normal in size. Pericardium: There is no evidence of pericardial effusion. Mitral Valve: The mitral valve is normal in structure. Normal mobility of the mitral valve leaflets. Mild mitral valve regurgitation. No evidence of mitral valve stenosis. Tricuspid Valve: The tricuspid valve is normal in structure. Tricuspid valve  regurgitation is mild . No evidence of tricuspid stenosis. Aortic Valve: The aortic valve is tricuspid. Aortic valve regurgitation is not visualized. No aortic stenosis is present. Pulmonic Valve: The pulmonic valve was not well visualized. Pulmonic valve regurgitation is trivial. No evidence of pulmonic stenosis. Aorta: The aortic root is normal in size and structure. Venous: The inferior vena cava is normal in size with less than 50% respiratory variability, suggesting right atrial pressure of 8 mmHg. IAS/Shunts: No atrial level shunt detected by color flow Doppler. Additional Comments: Severe global reduction in LV systolic function; mild LVH; mild LVE; restrictive filling; mild MR and TR; mild LAE; moderate pulmonary hypertension.  LEFT VENTRICLE PLAX 2D LVIDd:         5.46 cm LVIDs:         4.68 cm LV PW:         1.35 cm LV IVS:        1.23 cm LVOT diam:     1.90 cm LV SV:         38 LV SV Index:   18 LVOT Area:     2.84 cm  LV Volumes (MOD) LV vol d, MOD A2C: 132.0 ml LV vol d, MOD A4C: 144.0 ml LV vol s, MOD A2C: 106.0 ml LV vol s, MOD A4C: 101.0 ml LV SV MOD A2C:     26.0 ml LV SV MOD A4C:     144.0 ml LV SV MOD BP:      35.3 ml RIGHT VENTRICLE RV S prime:     9.36 cm/s TAPSE (M-mode): 2.2 cm LEFT ATRIUM             Index       RIGHT ATRIUM           Index LA diam:        3.60 cm 1.67 cm/m  RA Area:     17.50 cm LA Vol (A2C):   74.9 ml 34.79 ml/m RA Volume:   49.50 ml  22.99 ml/m LA Vol (A4C):   83.0 ml 38.56 ml/m LA Biplane Vol: 83.8 ml 38.93 ml/m  AORTIC VALVE LVOT Vmax:   89.10 cm/s LVOT Vmean:  64.400 cm/s LVOT VTI:    0.133 m  AORTA Ao Root diam: 2.50 cm Ao Asc diam:  2.60 cm MR Peak grad:    123.2 mmHg  TRICUSPID VALVE  MR Mean grad:    78.0 mmHg   TR Peak grad:   41.0 mmHg MR Vmax:         555.00 cm/s TR Vmax:        320.00 cm/s MR Vmean:        412.0 cm/s MR PISA:         1.57 cm    SHUNTS MR PISA Eff ROA: 11 mm      Systemic VTI:  0.13 m MR PISA Radius:  0.50 cm     Systemic Diam: 1.90 cm  Kirk Ruths MD Electronically signed by Kirk Ruths MD Signature Date/Time: 10/31/2019/10:14:50 AM    Final     Assessment and Plan:   1. Acute respiratory failure: patient presented with flash pulmonary edema. CXR with bilateral edema with possible superimposed PNA. BNP elevated to 1300s. WBC 16. Patient became unresponsive and hypoxic to 70s requiring intubation. She was given IV lasix 49m with UOP -1.3L so far. Started on IV antibiotics for possible PNA. Trops 8>18, relatively flat trend not c/w ACS. Echo showed EF 20-25% with G3DD, previously 45-50% 01/2019. BP was significantly elevated on admission which may have contributed.  - Favor continued diuresis with IV lasix 622mBID - Continue to monitor strict I&Os and daily weights - Continue mechanical ventilation per PCCM - Continue antibiotics per primary team - Could consider an ischemic evaluation given drop in EF and recent chest pain once respiratory status improves - Resume home carvedilol, entresto, and spironolactone when tolerating po  2. CAD s/p multiple interventions to RCA and LAD: reported to have had recent chest pain. EKG appears unchanged from previous with chronic STD in inferolateral leads. Trops with low flat trend not c/w ACS - Resume maintenance brilinta when tolerating po - Will defer to MD re: heparin - Continue statin and carvedilol  3. HTN: patient presented with hypertensive emergency. Subsequently became unresponsive with respiratory arrest requiring intubation. BP improved with brief nitro gtt, now on the soft side - Will check CT Head to r/o bleed in the setting of severe HTN - Resume home medications when tolerating po  4. DM type 2: historically poorly controlled. BG in the 500s on admission. Gap 15. Started on an insulin gtt.  - Continue management per primary team - Consider jardiance in the future     For questions or updates, please contact [facility] consult [email or web address] for contact  info under Cardiology/STEMI.   Signed, KrAbigail ButtsPA-C  10/31/2019 1:03 PM [phone] Patient seen and examined. Agree with assessment and plan.  Ms. 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  Upon arrival there was no complaints of antecedent chest pain.  On presentation she was significantly hypertensive.  Laboratory was notable for leukocytosis with a white blood count of 16,000, glucose 517, anion gap 15.  Her initial ECG showed sinus rhythm at 84 with LVH by voltage, inferolateral ST changes, and QTc interval increased at 482 ms.  He has known CAD and is status post prior intervention to her LAD and RCA with restenosis requiring repeat intervention.  She at one point was felt to have Takotsubo cardiomyopathy.  She is now felt to have an ischemic cardiomyopathy with EF's ranging from 45 to 50% in 2016 down to 35% in 2019, 20 to  25% in May 2020 and further increasing to 45 to 50% in July 2020.  A stat echo in the ER now shows an EF again markedly reduced at 20 to 25% with grade 3 diagnostic dysfunction consistent with restrictive physiology.  On exam she is intubated and sedated.  Her blood pressure has improved and was now 145/82.  Rhythm was regular in the 80s.  She had decreased breath sounds at the bases without wheezing.  Rhythm was regular with 1/6 systolic murmur.  There was no rub.  Abdomen was nontender.  There was trace lower extremity edema.  Chest x-ray revealed cardiomegaly with perihilar airspace opacity most likely representing alveolar edema bilaterally.  Superimposed pneumonia could not be completely excluded.  The patient sudden onset suggest the possibility of acute pulmonary edema contributing to her respiratory arrest.  BNP is elevated at 1339.  At  present we will continue with IV diuresis with Lasix 60 mg twice a day.  She is being started on empiric antibiotics.  She is on reduced dose of Brilinta at 60 mg daily with her multiple stents.  She is now on an insulin drip with her significant glucose elevation.  She had been on carvedilol, Entresto and spironolactone with her LV dysfunction.  Following stabilization, repeat right and left heart catheterization may be necessary.  She may be a candidate for prophylactic ICD implantation if LV function remains low.  Will follow.  Troy Sine, MD, [facility] 10/31/2019 4:16 PM

## 2019-10-31 NOTE — ED Triage Notes (Signed)
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 When EMS arrived pt was alert and oriented but diaphoretic, 90% on room air, pt placed on CPAP  With oxygen saturation of 96% but discontinued prior to entering ED. Upon arrival to treatment room in ED pt went unresponsive and agonal. Pt prepared for intubation upon arrival to ED.

## 2019-10-31 NOTE — ED Notes (Signed)
Orvis Brill - sister -- [phone]

## 2019-10-31 NOTE — H&P (Signed)
PULMONARY / CRITICAL CARE MEDICINE   NAME:  CIEL Chaney, MRN:  [id number], DOB:  09-26-1970, LOS: 0 ADMISSION DATE:  10/31/2019, CONSULTATION DATE: 10/31/2019 REFERRING MD:  Charlesetta Shanks, CHIEF COMPLAINT: Hypoxia  BRIEF HISTORY:    Amy Chaney is a 49 y.o. female with a pertinent past medical HTN, type 2 diabetes, hyperlipidemia, HFpEF, STEMI, NSTEMI PCD, tobacco use disorder who presented to [facility] in acute hypoxic respiratory failure and signs and symptoms of hypervolemia including pulmonary edema  HISTORY OF PRESENT ILLNESS   Amy Chaney is a 49 y.o. female with a pertinent past medical HTN, type 2 diabetes, hyperlipidemia, HFpEF, STEMI, NSTEMI PCD, tobacco use disorder who presented to [facility] unresponsive with hypoxia.  I spoke to the patient's sister, [name] who said that the patient has been feeling poorly for the last couple days.  She states that she has been short of breath, fatigued, with left-sided chest pain radiating to left arm.  Patient went to the ED on Tuesday for the symptoms but ended up going home due to long wait.  Her symptoms continued to progress until today when she called an ambulance to take her to the hospital.  In the ambulance the EMTs noted her to be alert with significant dyspnea and hypoxia with SPO2 is in the high 80s to low 90s.  On presentation at the hospital the patient was unresponsive with with normal pulses and SPO2 in the 90% and hypertensive at 190/83. CXR was significant for bilateral PE. Blood gas showed significant hypercarbic respiratory acidosis and the patient was quickly intubated.  Previous admission similar to this on 12/18/2018 with flash pulmonary edema and hypertensive emergency.  SIGNIFICANT PAST MEDICAL HISTORY   Essential hypertension Type 2 diabetes mellitus Hyperlipidemia HFpEF (01/2019 - EF of 45-50%) STEMI - LAD/diagonal (09/2017), RCA (03/2013) NSTEMI CAD s/p stents in LAD x3 (09/2017 & 06/2018), diagonal (09/2017), and  RCA (01/2013) Tobacco use disorder (22+ pack year history)  SIGNIFICANT EVENTS:  04/02 RSI and CVC placed >    STUDIES:   Chest XR: Tube positions as described without pneumothorax. Cardiomegaly. Perihilar airspace opacity likely represents alveolar edema bilaterally. A degree of superimposed pneumonia cannot be excluded. Etiology for the edema not certain, although a degree of congestive heart failure may well be present.  EKG: Sinus tachycardia  Echocardiogram: Global reduction in LV systolic function with an EF of 20-25% LV mildly dilated Grade 3 diastolic dysfunction Moderately elevated pulmonary arterial pressures  Left atrium mildly enlarged IVC normal in size with less than 50% respiratory variation  CULTURES:  04/02 blood cultures> pending  ANTIBIOTICS:  04/02 ceftriaxone and azithromycin>  LINES/TUBES:  04/02 CVC>  04/02 ET tube>  CONSULTANTS:  4/02 cardiology consulted in the ED   SUBJECTIVE:  Patient was evaluated today.  She was not alert or oriented to noxious stimuli.  ET tube in place  CONSTITUTIONAL: BP (!) 188/85   Pulse (!) 132   Temp (!) 96.9 F (36.1 C)   Resp 18   Ht 5' 5"  (1.651 m)   Wt 110.7 kg   LMP 10/20/2015   SpO2 96%   BMI 40.61 kg/m   No intake/output data recorded.     Vent Mode: PRVC FiO2 (%):  [100 %] 100 % Set Rate:  [18 bmp] 18 bmp Vt Set:  [440 mL] 440 mL PEEP:  [5 cmH20] 5 cmH20 Plateau Pressure:  [28 cmH20] 28 cmH20  PHYSICAL EXAM: General: Patient was unresponsive to noxious stimuli.  ET tube in  place Neuro: Pupils are sluggish HEENT: Normocephalic and atraumatic Cardiovascular: Heart sounds were difficult to appreciate secondary to mechanical ventilation Lungs: Lung sounds were unable to be appreciated due to mechanical ventilation Abdomen: Soft, minimally distended without rigidity Musculoskeletal: Minimal peripheral edema with 2+ pulses bilaterally Skin: Capillary refill within normal limits  RESOLVED  PROBLEM LIST   ASSESSMENT AND PLAN   Hypertensive Emergency  Patient showed signs and symptoms concerning for hypertensive emergency including a presenting blood pressure of 190s/150s with bilateral pulmonary edema acute hypoxic respiratory failure.  She also had a BMP that was significantly elevated at 1339 and an echo showing a new severely reduced ejection fraction concerning for acute on chronic CHF exacerbation. First troponin is negative. Patient was started on a nitro drip, intubated, and given 60 mg IV Lasix.  Patient's clinical picture similar to previous admission for hypertensive emergency.  Currently the patient has a history of noncompliance with her cardiac medications that could have precipitated this episode. Cardiology was consulted in the emergency department I appreciate their recommendations. -Continue mechanical ventilation -Second troponin pending -Blood pressure control with nitro gtt. -Patient was given a single dose of 60 mg IV Lasix  Acute Hypoxic Respiratory Failure: Presented unresponsive and hypoxic with a blood gas showing a respiratory acidosis with mild underlying metabolic acidosis likely secondary to elevated lactic acid. -Continue mechanical ventilation and wean when possible.  Hyperglycemia, Type 2 diabetes mellitus Patient presented with a significantly elevated glucose of 504. -Patient was started on regular insulin at 100 units/100 mL via endotool .  Leukocytosis: Difficult to determine if patient had any constitutional signs and symptoms concerning for infection.  He did present with leukocytosis of 16.0.  Her checks x-ray did show bilateral opacities concerning for pulmonary edema versus infiltrates secondary to pneumonia.  Considering the patient's overall clinical picture, her abnormal findings on CXR are likely secondary to cardiogenic pulmonary edema.  I will repeat the CBC and empirically cover her with antibiotics while waiting for blood cultures to  result. - Empirically start IV ceftriaxone plus azithromycin pending blood cultures.  SUMMARY OF TODAY'S PLAN:  We will continue to try to decrease the patient's blood pressure with nitro GGT and slowly restart her medications for heart failure and titrate this as needed.  We will continue antibiotic therapy with ceftriaxone and azithromycin pending blood culture results.  We will continue mechanical ventilation until patient is able to be weaned off the ventilator.  Continue regular insulin drip via endotool for hyperglycemia.  Best Practice / Goals of Care / Disposition.   DVT PROPHYLAXIS: Enoxaparin 40 mg NUTRITION: N.p.o. MOBILITY: Better with GOALS OF CARE: Full code FAMILY DISCUSSIONS: Spoke to patient's Sister Lelan Pons about patient's progress DISPOSITION pending further evaluation and management  LABS  Glucose Recent Labs  Lab 10/31/19 0847  GLUCAP 419*    BMET Recent Labs  Lab 10/28/19 1540 10/28/19 1540 10/31/19 0858 10/31/19 0923 10/31/19 1000  NA 138   < > 136 139 137  K 3.9   < > 3.7 4.1 4.2  CL 104  --  103 104  --   CO2 23  --  18*  --   --   BUN 10  --  12 15  --   CREATININE 0.81  --  1.04* 0.80  --   GLUCOSE 346*  --  504* 517*  --    < > = values in this interval not displayed.    Liver Enzymes Recent Labs  Lab 10/31/19 [id number]  AST 32  ALT 17  ALKPHOS 45  BILITOT 1.0  ALBUMIN 3.2*    Electrolytes Recent Labs  Lab 10/28/19 1540 10/31/19 0858  CALCIUM 9.0 8.4*  MG  --  1.6*  PHOS  --  6.0*    CBC Recent Labs  Lab 10/28/19 1540 10/28/19 1540 10/31/19 0858 10/31/19 0923 10/31/19 1000  WBC 11.6*  --  16.0*  --   --   HGB 11.5*   < > 12.3 13.3 14.3  HCT 36.8   < > 42.1 39.0 42.0  PLT 352  --  428*  --   --    < > = values in this interval not displayed.    ABG Recent Labs  Lab 10/31/19 1000  PHART 7.196*  PCO2ART 67.8*  PO2ART 87.0    Coag's No results for input(s): APTT, INR in the last 168 hours.  Sepsis Markers Recent  Labs  Lab 10/31/19 0859  LATICACIDVEN 4.1*    Cardiac Enzymes No results for input(s): TROPONINI, PROBNP in the last 168 hours.  PAST MEDICAL HISTORY :   She  has a past medical history of Coronary artery disease involving native coronary artery of native heart with angina pectoris ([place]) (01/2013), Daily headache, Depression, Dyslipidemia, goal LDL below 70, History of Doppler ultrasound, History of Takotsubo cardiomyopathy (11/2018), Hypertension, Ischemic cardiomyopathy (06/2018), Mild dysplasia of cervix ([place]) (02/04/2019), STEMI involving left anterior descending coronary artery ([place]) (09/2017; 06/2018), STEMI involving oth coronary artery of inferior wall ([place]) (03/2013), Tobacco abuse (01/11/2016), and Type II diabetes mellitus ([place]) (12/2010).  PAST SURGICAL HISTORY:  She  has a past surgical history that includes Cholecystectomy (~ 2000); Tubal ligation (1994); left heart catheterization with coronary angiogram (N/A, 02/25/2013); left heart catheterization with coronary angiogram (N/A, 04/01/2013); percutaneous coronary stent intervention (pci-s) (04/01/2013); LEFT HEART CATH AND CORONARY ANGIOGRAPHY (N/A, 10/10/2017); CORONARY STENT INTERVENTION (N/A, 10/10/2017); NM MYOVIEW LTD (12/2015); Percutaneous coronary stent intervention (pci-s) (02/25/2013); LEFT HEART CATH AND CORONARY ANGIOGRAPHY (N/A, 07/03/2018); CORONARY STENT INTERVENTION (N/A, 07/03/2018); transthoracic echocardiogram (07/04/2018); transthoracic echocardiogram (02/14/2019); and transthoracic echocardiogram (12/18/2018).  No Known Allergies  No current facility-administered medications on file prior to encounter.   Current Outpatient Medications on File Prior to Encounter  Medication Sig  . atorvastatin (LIPITOR) 80 MG tablet Take 1 tablet (80 mg total) by mouth daily.  . blood glucose meter kit and supplies Dispense based on patient and insurance preference. Use up to four times daily as directed. (FOR ICD-10 E10.9, E11.9).  Marland Kitchen  Blood Glucose Monitoring Suppl (TRUE METRIX METER) DEVI 1 kit by Does not apply route 4 (four) times daily.  . carvedilol (COREG) 6.25 MG tablet Take 1 tablet (6.25 mg total) by mouth 2 (two) times daily.  . furosemide (LASIX) 40 MG tablet Take 1 tablet (40 mg total) by mouth daily.  Marland Kitchen gabapentin (NEURONTIN) 300 MG capsule Take 1 capsule (300 mg total) by mouth at bedtime.  Marland Kitchen glipiZIDE (GLUCOTROL) 10 MG tablet Take 1 tablet (10 mg total) by mouth 2 (two) times daily before a meal.  . glucose blood test strip Use as instructed  . Insulin Glargine (LANTUS SOLOSTAR) 100 UNIT/ML Solostar Pen Inject 30 Units into the skin daily.  . metroNIDAZOLE (FLAGYL) 500 MG tablet Take 1 tablet (500 mg total) by mouth 2 (two) times daily.  . nitroGLYCERIN (NITROSTAT) 0.4 MG SL tablet Place 1 tablet (0.4 mg total) under the tongue every 5 (five) minutes as needed for chest pain. Reported on 11/25/2015  . sacubitril-valsartan (ENTRESTO)  49-51 MG Take 1 tablet by mouth 2 (two) times daily.  Marland Kitchen spironolactone (ALDACTONE) 25 MG tablet Take 0.5 tablets (12.5 mg total) by mouth daily.  . ticagrelor (BRILINTA) 60 MG TABS tablet Take 1 tablet (60 mg total) by mouth 2 (two) times daily.  . TRUEPLUS LANCETS 28G MISC 28 g by Does not apply route 4 (four) times daily.    FAMILY HISTORY:   Her family history includes Diabetes in her mother, sister, sister, son, and son.  SOCIAL HISTORY:  She  reports that she has been smoking cigarettes. She has been smoking about 0.00 packs per day for the past 22.00 years. She has never used smokeless tobacco. She reports current alcohol use. She reports current drug use. Drug: Marijuana.  REVIEW OF SYSTEMS:    Unable to perform due to patient unconscious.  Marianna Payment, D.O. Date 10/31/2019 Time 12:07 PM [facility] Internal Medicine, PGY-1 Pager: [phone]

## 2019-10-31 NOTE — Progress Notes (Signed)
Patient transported on vent to CT and then [id number] without complications.

## 2019-10-31 NOTE — Procedures (Signed)
Central Venous Catheter Insertion Procedure Note GRIZELDA AMPARANO PT:[id number] 11/13/1970  Procedure: Insertion of Central Venous Catheter Indications: Assessment of intravascular volume and Drug and/or fluid administration  Procedure Details Consent: Risks of procedure as well as the alternatives and risks of each were explained to the (patient/caregiver).  Consent for procedure obtained. Time Out: Verified patient identification, verified procedure, site/side was marked, verified correct patient position, special equipment/implants available, medications/allergies/relevent history reviewed, required imaging and test results available.  Performed  Maximum sterile technique was used including antiseptics, cap, gloves, gown, hand hygiene, mask and sheet. Skin prep: Chlorhexidine; local anesthetic administered A antimicrobial bonded/coated triple lumen catheter was placed in the right internal jugular vein using the Seldinger technique.  Evaluation Blood flow good Complications: No apparent complications Patient did tolerate procedure well. Chest X-ray ordered to verify placement.  CXR: normal.  Alaster Asfaw A Jahdai Padovano 10/31/2019, 12:20 PM

## 2019-11-01 DIAGNOSIS — J9601 Acute respiratory failure with hypoxia: Secondary | ICD-10-CM

## 2019-11-01 LAB — GLUCOSE, CAPILLARY
Glucose-Capillary: 133 mg/dL — ABNORMAL HIGH (ref 70–99)
Glucose-Capillary: 137 mg/dL — ABNORMAL HIGH (ref 70–99)
Glucose-Capillary: 143 mg/dL — ABNORMAL HIGH (ref 70–99)
Glucose-Capillary: 159 mg/dL — ABNORMAL HIGH (ref 70–99)
Glucose-Capillary: 172 mg/dL — ABNORMAL HIGH (ref 70–99)
Glucose-Capillary: 182 mg/dL — ABNORMAL HIGH (ref 70–99)
Glucose-Capillary: 183 mg/dL — ABNORMAL HIGH (ref 70–99)
Glucose-Capillary: 209 mg/dL — ABNORMAL HIGH (ref 70–99)
Glucose-Capillary: 215 mg/dL — ABNORMAL HIGH (ref 70–99)
Glucose-Capillary: 218 mg/dL — ABNORMAL HIGH (ref 70–99)
Glucose-Capillary: 221 mg/dL — ABNORMAL HIGH (ref 70–99)
Glucose-Capillary: 256 mg/dL — ABNORMAL HIGH (ref 70–99)
Glucose-Capillary: 283 mg/dL — ABNORMAL HIGH (ref 70–99)
Glucose-Capillary: 291 mg/dL — ABNORMAL HIGH (ref 70–99)
Glucose-Capillary: 356 mg/dL — ABNORMAL HIGH (ref 70–99)

## 2019-11-01 LAB — POCT I-STAT 7, (LYTES, BLD GAS, ICA,H+H)
Acid-Base Excess: 2 mmol/L (ref 0.0–2.0)
Bicarbonate: 26.2 mmol/L (ref 20.0–28.0)
Calcium, Ion: 1.18 mmol/L (ref 1.15–1.40)
HCT: 35 % — ABNORMAL LOW (ref 36.0–46.0)
Hemoglobin: 11.9 g/dL — ABNORMAL LOW (ref 12.0–15.0)
O2 Saturation: 98 %
Patient temperature: 98.6
Potassium: 3.1 mmol/L — ABNORMAL LOW (ref 3.5–5.1)
Sodium: 141 mmol/L (ref 135–145)
TCO2: 27 mmol/L (ref 22–32)
pCO2 arterial: 38.8 mmHg (ref 32.0–48.0)
pH, Arterial: 7.437 (ref 7.350–7.450)
pO2, Arterial: 106 mmHg (ref 83.0–108.0)

## 2019-11-01 LAB — BASIC METABOLIC PANEL
Anion gap: 11 (ref 5–15)
BUN: 15 mg/dL (ref 6–20)
CO2: 25 mmol/L (ref 22–32)
Calcium: 8.4 mg/dL — ABNORMAL LOW (ref 8.9–10.3)
Chloride: 102 mmol/L (ref 98–111)
Creatinine, Ser: 0.83 mg/dL (ref 0.44–1.00)
GFR calc Af Amer: >60 mL/min (ref >60)
GFR calc non Af Amer: >60 mL/min (ref >60)
Glucose, Bld: 232 mg/dL — ABNORMAL HIGH (ref 70–99)
Potassium: 3.3 mmol/L — ABNORMAL LOW (ref 3.5–5.1)
Sodium: 138 mmol/L (ref 135–145)

## 2019-11-01 LAB — CBC
HCT: 35.4 % — ABNORMAL LOW (ref 36.0–46.0)
Hemoglobin: 11.2 g/dL — ABNORMAL LOW (ref 12.0–15.0)
MCH: 26.5 pg (ref 26.0–34.0)
MCHC: 31.6 g/dL (ref 30.0–36.0)
MCV: 83.7 fL (ref 80.0–100.0)
Platelets: 360 10*3/uL (ref 150–400)
RBC: 4.23 MIL/uL (ref 3.87–5.11)
RDW: 14.3 % (ref 11.5–15.5)
WBC: 15.9 10*3/uL — ABNORMAL HIGH (ref 4.0–10.5)
nRBC: 0 % (ref 0.0–0.2)

## 2019-11-01 LAB — MAGNESIUM
Magnesium: 2.4 mg/dL (ref 1.7–2.4)
Magnesium: 1.9 mg/dL (ref 1.7–2.4)

## 2019-11-01 LAB — PHOSPHORUS
Phosphorus: 2.7 mg/dL (ref 2.5–4.6)
Phosphorus: 3.3 mg/dL (ref 2.5–4.6)

## 2019-11-01 MED ORDER — SACUBITRIL-VALSARTAN 24-26 MG PO TABS
1.0000 | ORAL_TABLET | Freq: Two times a day (BID) | ORAL | Status: DC
  Administered 2019-11-01 – 2019-11-04 (×7): 1 via ORAL
  Filled 2019-11-01 (×8): qty 1

## 2019-11-01 MED ORDER — CHLORHEXIDINE GLUCONATE 0.12 % MT SOLN
OROMUCOSAL | Status: AC
  Filled 2019-11-01: qty 15

## 2019-11-01 MED ORDER — CHLORHEXIDINE GLUCONATE 0.12% ORAL RINSE (MEDLINE KIT)
15.0000 mL | Freq: Two times a day (BID) | OROMUCOSAL | Status: DC
  Administered 2019-11-01 – 2019-11-02 (×3): 15 mL via OROMUCOSAL

## 2019-11-01 MED ORDER — ADULT MULTIVITAMIN LIQUID CH
15.0000 mL | Freq: Every day | ORAL | Status: DC
  Administered 2019-11-02: 15 mL
  Filled 2019-11-01: qty 15

## 2019-11-01 MED ORDER — CARVEDILOL 12.5 MG PO TABS
6.2500 mg | ORAL_TABLET | Freq: Two times a day (BID) | ORAL | Status: DC
  Administered 2019-11-01 – 2019-11-02 (×4): 6.25 mg
  Filled 2019-11-01 (×4): qty 1

## 2019-11-01 MED ORDER — INSULIN ASPART 100 UNIT/ML ~~LOC~~ SOLN
3.0000 [IU] | SUBCUTANEOUS | Status: DC
  Administered 2019-11-01 (×2): 9 [IU] via SUBCUTANEOUS
  Administered 2019-11-01: 3 [IU] via SUBCUTANEOUS

## 2019-11-01 MED ORDER — SPIRONOLACTONE 12.5 MG HALF TABLET
12.5000 mg | ORAL_TABLET | Freq: Every day | ORAL | Status: DC
  Administered 2019-11-01 – 2019-11-02 (×2): 12.5 mg
  Filled 2019-11-01 (×3): qty 1

## 2019-11-01 MED ORDER — PRO-STAT SUGAR FREE PO LIQD
30.0000 mL | Freq: Three times a day (TID) | ORAL | Status: DC
  Administered 2019-11-01 – 2019-11-02 (×3): 30 mL
  Filled 2019-11-01 (×3): qty 30

## 2019-11-01 MED ORDER — TICAGRELOR 90 MG PO TABS
90.0000 mg | ORAL_TABLET | Freq: Two times a day (BID) | ORAL | Status: DC
  Administered 2019-11-01 – 2019-11-02 (×3): 90 mg
  Filled 2019-11-01 (×5): qty 1

## 2019-11-01 MED ORDER — INSULIN ASPART 100 UNIT/ML ~~LOC~~ SOLN
4.0000 [IU] | Freq: Three times a day (TID) | SUBCUTANEOUS | Status: DC
  Administered 2019-11-01 (×2): 4 [IU] via SUBCUTANEOUS

## 2019-11-01 MED ORDER — ORAL CARE MOUTH RINSE
15.0000 mL | OROMUCOSAL | Status: DC
  Administered 2019-11-01 – 2019-11-02 (×8): 15 mL via OROMUCOSAL

## 2019-11-01 MED ORDER — SODIUM CHLORIDE 0.9% FLUSH
10.0000 mL | INTRAVENOUS | Status: DC | PRN
  Administered 2019-11-01: 20 mL

## 2019-11-01 MED ORDER — POTASSIUM CHLORIDE 20 MEQ/15ML (10%) PO SOLN
20.0000 meq | ORAL | Status: AC
  Administered 2019-11-01 (×2): 20 meq
  Filled 2019-11-01 (×2): qty 15

## 2019-11-01 MED ORDER — INSULIN GLARGINE 100 UNIT/ML ~~LOC~~ SOLN
15.0000 [IU] | Freq: Every day | SUBCUTANEOUS | Status: DC
  Administered 2019-11-01: 15 [IU] via SUBCUTANEOUS
  Filled 2019-11-01 (×2): qty 0.15

## 2019-11-01 MED ORDER — INSULIN GLARGINE 100 UNIT/ML ~~LOC~~ SOLN
15.0000 [IU] | Freq: Every day | SUBCUTANEOUS | Status: DC
  Filled 2019-11-01: qty 0.15

## 2019-11-01 NOTE — Progress Notes (Signed)
Progress Note  Patient Name: MCKYLA DECKMAN Date of Encounter: 11/01/2019  Primary Cardiologist: Glenetta Hew, MD   Subjective   Intubated; shakes head no to CP or dyspnea  Inpatient Medications    Scheduled Meds: . chlorhexidine gluconate (MEDLINE KIT)  15 mL Mouth Rinse BID  . Chlorhexidine Gluconate Cloth  6 each Topical Daily  . enoxaparin (LOVENOX) injection  0.5 mg/kg Subcutaneous Q24H  . feeding supplement (PRO-STAT SUGAR FREE 64)  30 mL Per Tube TID  . feeding supplement (VITAL HIGH PROTEIN)  1,000 mL Per Tube Q24H  . furosemide  60 mg Intravenous BID  . insulin aspart  3-9 Units Subcutaneous Q4H  . mouth rinse  15 mL Mouth Rinse 10 times per day  . [START ON 11/02/2019] multivitamin  15 mL Per Tube Daily   Continuous Infusions: . azithromycin Stopped (10/31/19 1657)  . cefTRIAXone (ROCEPHIN)  IV Stopped (10/31/19 1612)  . nitroGLYCERIN 70 mcg/min (11/01/19 1100)  . propofol (DIPRIVAN) infusion 30 mcg/kg/min (11/01/19 1100)   PRN Meds: fentaNYL (SUBLIMAZE) injection, midazolam   Vital Signs    Vitals:   11/01/19 1000 11/01/19 1015 11/01/19 1030 11/01/19 1045  BP: (!) 150/85 (!) 166/93 (!) 178/91 (!) 170/88  Pulse: 87 88 87 93  Resp: 19 20 20 18   Temp: 98.4 F (36.9 C) 98.4 F (36.9 C) 98.4 F (36.9 C) 98.6 F (37 C)  TempSrc:      SpO2: 99% 98% 98% 99%  Weight:      Height:        Intake/Output Summary (Last 24 hours) at 11/01/2019 1107 Last data filed at 11/01/2019 1100 Gross per 24 hour  Intake 2618.43 ml  Output 3200 ml  Net -581.57 ml   Last 3 Weights 10/31/2019 06/19/2019 02/26/2019  Weight (lbs) 244 lb 0.8 oz 244 lb 231 lb  Weight (kg) 110.7 kg 110.678 kg 104.781 kg      Telemetry    Sinus - Personally Reviewed  Physical Exam   GEN: Intubated; alert; obese Neck: No JVD Cardiac: RRR, no murmurs, rubs, or gallops.  Respiratory: Clear to auscultation bilaterally. GI: Soft, nontender, non-distended  MS: No edema; No deformity. Neuro:   Nonfocal  Psych: Normal affect   Labs    High Sensitivity Troponin:   Recent Labs  Lab 10/28/19 1540 10/31/19 0858 10/31/19 1157  TROPONINIHS 17 8 18*      Chemistry Recent Labs  Lab 10/31/19 0858 10/31/19 0858 10/31/19 0923 10/31/19 1000 10/31/19 1114 10/31/19 1409 10/31/19 1630 11/01/19 0301 11/01/19 0337  NA 136   < > 139   < >  --    < > 141 138 141  K 3.7   < > 4.1   < >  --    < > 3.5 3.3* 3.1*  CL 103   < > 104  --   --   --  105 102  --   CO2 18*  --   --   --   --   --  23 25  --   GLUCOSE 504*   < > 517*  --   --   --  182* 232*  --   BUN 12   < > 15  --   --   --  13 15  --   CREATININE 1.04*   < > 0.80   < > 1.03*  --  0.89 0.83  --   CALCIUM 8.4*  --   --   --   --   --  8.3* 8.4*  --   PROT 7.6  --   --   --   --   --  7.1  --   --   ALBUMIN 3.2*  --   --   --   --   --  3.0*  --   --   AST 32  --   --   --   --   --  23  --   --   ALT 17  --   --   --   --   --  19  --   --   ALKPHOS 45  --   --   --   --   --  43  --   --   BILITOT 1.0  --   --   --   --   --  0.5  --   --   GFRNONAA >60  --   --    < > >60  --  >60 >60  --   GFRAA >60  --   --    < > >60  --  >60 >60  --   ANIONGAP 15  --   --   --   --   --  13 11  --    < > = values in this interval not displayed.     Hematology Recent Labs  Lab 10/31/19 1114 10/31/19 1409 10/31/19 1630 11/01/19 0301 11/01/19 0337  WBC 18.0*  --  19.2* 15.9*  --   RBC 4.71  --  4.36 4.23  --   HGB 12.5   < > 11.6* 11.2* 11.9*  HCT 40.7   < > 37.5 35.4* 35.0*  MCV 86.4  --  86.0 83.7  --   MCH 26.5  --  26.6 26.5  --   MCHC 30.7  --  30.9 31.6  --   RDW 14.3  --  14.4 14.3  --   PLT 403*  --  381 360  --    < > = values in this interval not displayed.    BNP Recent Labs  Lab 10/31/19 0858  BNP 1,339.2*     Radiology    CT HEAD WO CONTRAST  Result Date: 10/31/2019 CLINICAL DATA:  Mental status change. Hypertensive emergency. Respiratory distress. EXAM: CT HEAD WITHOUT CONTRAST TECHNIQUE:  Contiguous axial images were obtained from the base of the skull through the vertex without intravenous contrast. COMPARISON:  Head CT 12/18/2018 FINDINGS: Brain: No intracranial hemorrhage, mass effect, or midline shift. No hydrocephalus. Stable ventricular size from prior. Cerebellar tonsils extending beyond the foramen magnum unchanged configuration from prior exam consistent with Chiari 1 malformation. No evidence of territorial infarct or acute ischemia. No extra-axial or intracranial fluid collection. Vascular: No hyperdense vessel. Skull: No fracture or focal lesion. Sinuses/Orbits: Mucous retention cyst right maxillary sinus. No acute findings. Other: None. IMPRESSION: 1. No acute intracranial abnormality. 2. 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 Electronically Signed   By: Keith Rake M.D.   On: 10/31/2019 17:30   DG Chest Portable 1 View  Result Date: 10/31/2019 CLINICAL DATA:  Central line placement EXAM: PORTABLE CHEST 1 VIEW COMPARISON:  10/31/2019 at 0903 hours FINDINGS: Interval placement of a right IJ approach central venous catheter with distal tip terminating at the level of the distal SVC. Endotracheal tube terminates 4.5 cm superior to the carina. Enteric tube courses below the diaphragm with distal tip within the expected location of the  gastric body. Stable mild cardiomegaly. Extensive bilateral airspace opacities, similar to prior. No pneumothorax. IMPRESSION: 1. Interval placement of a right IJ approach central venous catheter with distal tip terminating at the level of the distal SVC. No pneumothorax. 2. Otherwise stable exam. Electronically Signed   By: Davina Poke D.O.   On: 10/31/2019 10:35   DG Chest Port 1 View  Result Date: 10/31/2019 CLINICAL DATA:  Hypoxia EXAM: PORTABLE CHEST 1 VIEW COMPARISON:  October 28, 2019 FINDINGS: Endotracheal tube tip is 2.9 cm above the carina. Nasogastric tube tip and side port are below the diaphragm. No pneumothorax. There is  widespread airspace opacity throughout the lungs bilaterally, primarily in a perihilar type distribution. Heart is mildly enlarged with pulmonary vascularity normal. No adenopathy. No bone lesions. IMPRESSION: Tube positions as described without pneumothorax. Cardiomegaly. Perihilar airspace opacity likely represents alveolar edema bilaterally. A degree of superimposed pneumonia cannot be excluded. Etiology for the edema not certain, although a degree of congestive heart failure may well be present. Electronically Signed   By: Lowella Grip III M.D.   On: 10/31/2019 09:19   DG Abd Portable 1 View  Result Date: 10/31/2019 CLINICAL DATA:  Orogastric tube placed EXAM: PORTABLE ABDOMEN - 1 VIEW COMPARISON:  None. FINDINGS: Orogastric tube tip and side port are in the stomach. There is no bowel dilatation or air-fluid level to suggest bowel obstruction. No free air is evident on this supine examination. There are surgical clips in the right upper quadrant. IMPRESSION: Orogastric tube tip and side port in stomach. No overt bowel obstruction or free air evident. Electronically Signed   By: Lowella Grip III M.D.   On: 10/31/2019 09:20   ECHOCARDIOGRAM COMPLETE  Result Date: 10/31/2019    ECHOCARDIOGRAM REPORT   Patient Name:   MAISON AGRUSA Date of Exam: 10/31/2019 Medical Rec #:  [id number]        Height:       65.0 in Accession #:    [id number]       Weight:       244.0 lb Date of Birth:  [birth year]         BSA:          2.153 m Patient Age:    49 years         BP:           142/75 mmHg Patient Gender: F                HR:           119 bpm. Exam Location:  Inpatient Procedure: 2D Echo, Color Doppler and Cardiac Doppler STAT ECHO Indications:    Respiratory Arrest  History:        Patient has prior history of Echocardiogram examinations, most                 recent 02/14/2019. CHF, CAD; Risk Factors:Hypertension, Diabetes                 and Dyslipidemia.  Sonographer:    [name] Senior RDCS Referring Phys:  [id number] [place]  1. Severe global reduction in LV systolic function; mild LVH; mild LVE; restrictive filling; mild MR and TR; mild LAE; moderate pulmonary hypertension.  2. Left ventricular ejection fraction, by estimation, is 20 to 25%. The left ventricle has severely decreased function. The left ventricle demonstrates global hypokinesis. The left ventricular internal cavity size was mildly dilated. There is mild left ventricular hypertrophy. Left ventricular diastolic  parameters are consistent with Grade III diastolic dysfunction (restrictive).  3. Right ventricular systolic function is normal. The right ventricular size is normal. There is moderately elevated pulmonary artery systolic pressure.  4. Left atrial size was mildly dilated.  5. The mitral valve is normal in structure. Mild mitral valve regurgitation. No evidence of mitral stenosis.  6. The aortic valve is tricuspid. Aortic valve regurgitation is not visualized. No aortic stenosis is present.  7. The inferior vena cava is normal in size with <50% respiratory variability, suggesting right atrial pressure of 8 mmHg. FINDINGS  Left Ventricle: Left ventricular ejection fraction, by estimation, is 20 to 25%. The left ventricle has severely decreased function. The left ventricle demonstrates global hypokinesis. The left ventricular internal cavity size was mildly dilated. There is mild left ventricular hypertrophy. Left ventricular diastolic parameters are consistent with Grade III diastolic dysfunction (restrictive). Right Ventricle: The right ventricular size is normal.Right ventricular systolic function is normal. There is moderately elevated pulmonary artery systolic pressure. The tricuspid regurgitant velocity is 3.20 m/s, and with an assumed right atrial pressure of 8 mmHg, the estimated right ventricular systolic pressure is 76.8 mmHg. Left Atrium: Left atrial size was mildly dilated. Right Atrium: Right atrial size was normal in  size. Pericardium: There is no evidence of pericardial effusion. Mitral Valve: The mitral valve is normal in structure. Normal mobility of the mitral valve leaflets. Mild mitral valve regurgitation. No evidence of mitral valve stenosis. Tricuspid Valve: The tricuspid valve is normal in structure. Tricuspid valve regurgitation is mild . No evidence of tricuspid stenosis. Aortic Valve: The aortic valve is tricuspid. Aortic valve regurgitation is not visualized. No aortic stenosis is present. Pulmonic Valve: The pulmonic valve was not well visualized. Pulmonic valve regurgitation is trivial. No evidence of pulmonic stenosis. Aorta: The aortic root is normal in size and structure. Venous: The inferior vena cava is normal in size with less than 50% respiratory variability, suggesting right atrial pressure of 8 mmHg. IAS/Shunts: No atrial level shunt detected by color flow Doppler. Additional Comments: Severe global reduction in LV systolic function; mild LVH; mild LVE; restrictive filling; mild MR and TR; mild LAE; moderate pulmonary hypertension.  LEFT VENTRICLE PLAX 2D LVIDd:         5.46 cm LVIDs:         4.68 cm LV PW:         1.35 cm LV IVS:        1.23 cm LVOT diam:     1.90 cm LV SV:         38 LV SV Index:   18 LVOT Area:     2.84 cm  LV Volumes (MOD) LV vol d, MOD A2C: 132.0 ml LV vol d, MOD A4C: 144.0 ml LV vol s, MOD A2C: 106.0 ml LV vol s, MOD A4C: 101.0 ml LV SV MOD A2C:     26.0 ml LV SV MOD A4C:     144.0 ml LV SV MOD BP:      35.3 ml RIGHT VENTRICLE RV S prime:     9.36 cm/s TAPSE (M-mode): 2.2 cm LEFT ATRIUM             Index       RIGHT ATRIUM           Index LA diam:        3.60 cm 1.67 cm/m  RA Area:     17.50 cm LA Vol (A2C):   74.9 ml 34.79 ml/m RA Volume:  49.50 ml  22.99 ml/m LA Vol (A4C):   83.0 ml 38.56 ml/m LA Biplane Vol: 83.8 ml 38.93 ml/m  AORTIC VALVE LVOT Vmax:   89.10 cm/s LVOT Vmean:  64.400 cm/s LVOT VTI:    0.133 m  AORTA Ao Root diam: 2.50 cm Ao Asc diam:  2.60 cm MR Peak  grad:    123.2 mmHg  TRICUSPID VALVE MR Mean grad:    78.0 mmHg   TR Peak grad:   41.0 mmHg MR Vmax:         555.00 cm/s TR Vmax:        320.00 cm/s MR Vmean:        412.0 cm/s MR PISA:         1.57 cm    SHUNTS MR PISA Eff ROA: 11 mm      Systemic VTI:  0.13 m MR PISA Radius:  0.50 cm     Systemic Diam: 1.90 cm Kirk Ruths MD Electronically signed by Kirk Ruths MD Signature Date/Time: 10/31/2019/10:14:50 AM    Final     Patient Profile     49 y.o. female with past medical history of coronary artery disease status post PCI of LAD and diagonal in 2019 and RCA in 2014, chronic combined systolic/diastolic congestive heart failure, ischemic cardiomyopathy, hypertension, hyperlipidemia, diabetes mellitus, tobacco abuse admitted with hypertensive urgency and respiratory failure.  Echocardiogram shows ejection fraction 20 to 25%, restrictive filling, mild left atrial enlargement, mild mitral regurgitation.  Initial blood pressure severely elevated.  Assessment & Plan    1 acute on chronic systolic congestive heart failure-ejection fraction severely reduced.  Patient in pulmonary edema at time of admission possibly secondary to severe hypertension.  Continue IV diuresis and follow renal function.  2 cardiomyopathy-likely combination of ischemic and hypertensive.  Continue carvedilol and Entresto.  Continue spironolactone.  Titrate medications as tolerated.  Will ultimately likely need right and left cardiac catheterization once extubated and more stable.  3 VDRF-ventilator management per critical care.  4 coronary artery disease-continue Brilinta.  Resume Lipitor when extubated.  5 hypertension-we will continue present medications.  Advance as needed.  For questions or updates, please contact [place] Please consult [email or web address] for contact info under        Signed, Kirk Ruths, MD  11/01/2019, 11:07 AM

## 2019-11-01 NOTE — Progress Notes (Signed)
Called sister Myna Hidalgo and daughter to provide update on patient's status. No answer.

## 2019-11-01 NOTE — Progress Notes (Signed)
eLink Physician-Brief Progress Note Patient Name: ALEJANDRO MCCALLUM DOB: [birth year] MRN: [id number]   Date of Service  11/01/2019  HPI/Events of Note  Asked if IV fluids needed since tube feeds are already at goal, low EF in 20s  eICU Interventions  Will discontinue iv fluids      Intervention Category Major Interventions: Other:  Margaretmary Lombard 11/01/2019, 6:06 AM

## 2019-11-01 NOTE — Progress Notes (Signed)
PULMONARY / CRITICAL CARE MEDICINE   NAME:  Amy Chaney, MRN:  [id number], DOB:  12/17/1970, LOS: 1 ADMISSION DATE:  10/31/2019, CONSULTATION DATE: 10/31/2019 REFERRING MD:  Charlesetta Shanks, CHIEF COMPLAINT: Hypoxia  BRIEF HISTORY:    Amy Chaney is a 49 y.o. female with a pertinent past medical HTN, type 2 diabetes, hyperlipidemia, HFpEF, STEMI, NSTEMI PCD, tobacco use disorder who presented to [facility] in acute hypoxic respiratory failure and signs and symptoms of hypervolemia including pulmonary edema  HISTORY OF PRESENT ILLNESS   Amy Chaney is a 49 y.o. female with a pertinent past medical HTN, T2DM,  HLD, HFrEF, STEMI s/p multiple stents, NSTEMI PCD, tobacco use disorder who presented to [facility] unresponsive with hypoxia.  I spoke to the patient's sister, [name], who said that patient has been feeling poorly for the last couple days.  She has been short of breath, fatigued, with left-sided chest pain radiating to left arm.  Patient went to the ED on Tuesday for the symptoms but ended up going home due to long wait.  Her symptoms continued to progress until today when she called an ambulance to take her to the hospital.  In the ambulance the EMTs noted her to be alert with significant dyspnea and hypoxia with SPO2 is in the high 80s to low 90s.  On presentation at the hospital the patient was unresponsive with with normal pulses and SPO2 in the 90% and hypertensive at 190/83. CXR was significant for bilateral pulmonary edema. Blood gas showed significant hypercarbic respiratory acidosis and the patient was quickly intubated.  Previous admission similar to this on 12/18/2018 with flash pulmonary edema and hypertensive emergency.  SIGNIFICANT PAST MEDICAL HISTORY   Essential hypertension Type 2 diabetes mellitus Hyperlipidemia HFrEF (01/2019 - EF of 45-50%) STEMI - LAD/diagonal (09/2017), RCA (03/2013) NSTEMI CAD s/p stents in LAD x3 (09/2017 & 06/2018), diagonal (09/2017), and RCA  (01/2013) Tobacco use disorder (22+ pack year history)  SIGNIFICANT EVENTS:  04/02 RSI and CVC placed >    STUDIES:   Chest XR: Tube positions as described without pneumothorax. Cardiomegaly. Perihilar airspace opacity likely represents alveolar edema bilaterally. A degree of superimposed pneumonia cannot be excluded.Etiology for the edema not certain, although a degree of congestive heart failure may well be present.  EKG: Sinus tachycardia  Echocardiogram: Global reduction in LV systolic function with an EF of 20-25% LV mildly dilated Grade 3 diastolic dysfunction Moderately elevated pulmonary arterial pressures  Left atrium mildly enlarged IVC normal in size with less than 50% respiratory variation  CULTURES:  04/02 blood cultures> pending  ANTIBIOTICS:  04/02 ceftriaxone and azithromycin>  LINES/TUBES:  04/02 CVC> 04/02 ET tube>  CONSULTANTS:  4/02 cardiology consulted in the ED   SUBJECTIVE:  Patient is awake this AM. Able to nod yes or no to questions. Follows commands. No complaints.   CONSTITUTIONAL: BP (!) 196/92   Pulse 85   Temp 98.2 F (36.8 C)   Resp (!) 24   Ht 5' 5"  (1.651 m)   Wt 110.7 kg   LMP 10/20/2015   SpO2 99%   BMI 40.61 kg/m   I/O last 3 completed shifts: In: 2239.4 [I.V.:1239.7; NG/GT:366; IV Piggyback:633.7] Out: 4193 [Urine:3575]     Vent Mode: PRVC FiO2 (%):  [40 %-100 %] 40 % Set Rate:  [24 bmp] 24 bmp Vt Set:  [440 mL] 440 mL PEEP:  [5 cmH20] 5 cmH20 Plateau Pressure:  [15 cmH20-19 cmH20] 15 cmH20  PHYSICAL EXAM: General: opens eyes to  verbal stimulus, follows simple commands, nods yes/no to questions  Neuro: follows simple commands, PERRL, able to move all 4 extremities  Cardiovascular: RRR, no mrg, extremities are warm and well perfused  Lungs: difficult to auscultate due to body habitus, mild bilateral ronchi anteriorly  Abdomen: abdomen is obese, but soft and nontender, normoactive bowel sounds  Ext: mild pitting  edema bilaterally    RESOLVED PROBLEM LIST   ASSESSMENT AND PLAN    # Hypertensive Emergency in the setting of medication non-adherence  Blood pressure remains elevated with sBP in the 180s-210s while on the nitroglycerin gtt. Denying chest pain and HA.  - Titrating ntiro gtt, currently at 70 mcg  - Will resume home BP meds: Coreg 6.25 mg BID, Entresto 24-26 mg BID, and spironolactone 12.5 mg QD  - Diuresing with IV Lasix 60 mg BID per cardiology recs  - Goal to reduce MAP by 25% in 24 hours   # Acute Hypoxic Respiratory Failure This is thought to be secondary to flash pulmonary edema 2/2 HTN emergency vs CAP given leukocytosis and bilateral opacities. Currently on full vent support. ABG 7.43/38/106.  - Continue ventilator support, wean per protocol  - VAP prevention protocol  - On fentanyl and propofol 20 mcg  - WBC trending down, continue CAP coverage with Rocephin and azithromycin. Plan to repeat CXr tomorrow to assess for improvent in opacities which would favor edema vs infectious process   # HFrEF with newly reduced EF Patient was found to have newly reduced EF of 20% from 45% in 01/2019. Could be secondary to long-term uncontrolled HTN vs new ischemic event given her cardiac history and ST changes on EKG though troponin only 18.  - Cardiology following, appreciate recommendations  - Diuresing with IV Lasix 60 mg BID, net output -1.3L  - Yesterday's wt 244 lbs which is about the same as during her outpatient cardiology visit in 06/2019 - Possible R/LHC during admission to evaluate new reduction in EF   #$ Hyperglycemia, Type 2 diabetes mellitus Patient presented with a significantly elevated glucose of 504 and AG 15. No acidosis or ketones in urine. BG now in the 150s. Will transition from endotool to SQ insulin.  - Start Lantus 15 units QHS, will monitor BG as she is NPO   - Resistant- SSI w/ CBG checks q4h    SUMMARY OF TODAY'S PLAN:  We will continue to try to decrease the  patient's blood pressure with nitro GGT and slowly restart her medications for heart failure and titrate this as needed.  We will continue antibiotic therapy with ceftriaxone and azithromycin pending blood culture results.  We will continue mechanical ventilation until patient is able to be weaned off the ventilator.  Transitioned to SQ insulin.  Best Practice / Goals of Care / Disposition.   DVT PROPHYLAXIS: Enoxaparin 40 mg NUTRITION: N.p.o. MOBILITY: PT GOALS OF CARE: Full code FAMILY DISCUSSIONS: will call sister Lelan Pons with updates  DISPOSITION pending further evaluation and management  LABS  Glucose Recent Labs  Lab 11/01/19 0310 11/01/19 0418 11/01/19 0522 11/01/19 0616 11/01/19 0730 11/01/19 0831  GLUCAP 218* 183* 143* 133* 159* 172*    BMET Recent Labs  Lab 10/31/19 0858 10/31/19 0858 10/31/19 0923 10/31/19 1000 10/31/19 1114 10/31/19 1409 10/31/19 1630 11/01/19 0301 11/01/19 0337  NA 136   < > 139   < >  --    < > 141 138 141  K 3.7   < > 4.1   < >  --    < >  3.5 3.3* 3.1*  CL 103   < > 104  --   --   --  105 102  --   CO2 18*  --   --   --   --   --  23 25  --   BUN 12   < > 15  --   --   --  13 15  --   CREATININE 1.04*   < > 0.80   < > 1.03*  --  0.89 0.83  --   GLUCOSE 504*   < > 517*  --   --   --  182* 232*  --    < > = values in this interval not displayed.    Liver Enzymes Recent Labs  Lab 10/31/19 0858 10/31/19 1630  AST 32 23  ALT 17 19  ALKPHOS 45 43  BILITOT 1.0 0.5  ALBUMIN 3.2* 3.0*    Electrolytes Recent Labs  Lab 10/31/19 0858 10/31/19 0858 10/31/19 1114 10/31/19 1630 11/01/19 0301  CALCIUM 8.4*  --   --  8.3* 8.4*  MG 1.6*   < > 1.4* 2.2 2.4  PHOS 6.0*   < > 4.7* 2.7 2.7   < > = values in this interval not displayed.    CBC Recent Labs  Lab 10/31/19 1114 10/31/19 1409 10/31/19 1630 11/01/19 0301 11/01/19 0337  WBC 18.0*  --  19.2* 15.9*  --   HGB 12.5   < > 11.6* 11.2* 11.9*  HCT 40.7   < > 37.5 35.4* 35.0*   PLT 403*  --  381 360  --    < > = values in this interval not displayed.    ABG Recent Labs  Lab 10/31/19 1000 10/31/19 1409 11/01/19 0337  PHART 7.196* 7.397 7.437  PCO2ART 67.8* 42.6 38.8  PO2ART 87.0 266.0* 106.0    Coag's No results for input(s): APTT, INR in the last 168 hours.  Sepsis Markers Recent Labs  Lab 10/31/19 0859 10/31/19 1157  LATICACIDVEN 4.1* 1.2    Cardiac Enzymes No results for input(s): TROPONINI, PROBNP in the last 168 hours.  PAST MEDICAL HISTORY :   She  has a past medical history of Coronary artery disease involving native coronary artery of native heart with angina pectoris ([place]) (01/2013), Daily headache, Depression, Dyslipidemia, goal LDL below 70, History of Doppler ultrasound, History of Takotsubo cardiomyopathy (11/2018), Hypertension, Ischemic cardiomyopathy (06/2018), Mild dysplasia of cervix ([place]) (02/04/2019), STEMI involving left anterior descending coronary artery ([place]) (09/2017; 06/2018), STEMI involving oth coronary artery of inferior wall ([place]) (03/2013), Tobacco abuse (01/11/2016), and Type II diabetes mellitus ([place]) (12/2010).  PAST SURGICAL HISTORY:  She  has a past surgical history that includes Cholecystectomy (~ 2000); Tubal ligation (1994); left heart catheterization with coronary angiogram (N/A, 02/25/2013); left heart catheterization with coronary angiogram (N/A, 04/01/2013); percutaneous coronary stent intervention (pci-s) (04/01/2013); LEFT HEART CATH AND CORONARY ANGIOGRAPHY (N/A, 10/10/2017); CORONARY STENT INTERVENTION (N/A, 10/10/2017); NM MYOVIEW LTD (12/2015); Percutaneous coronary stent intervention (pci-s) (02/25/2013); LEFT HEART CATH AND CORONARY ANGIOGRAPHY (N/A, 07/03/2018); CORONARY STENT INTERVENTION (N/A, 07/03/2018); transthoracic echocardiogram (07/04/2018); transthoracic echocardiogram (02/14/2019); and transthoracic echocardiogram (12/18/2018).  No Known Allergies  No current facility-administered medications on file  prior to encounter.   Current Outpatient Medications on File Prior to Encounter  Medication Sig  . atorvastatin (LIPITOR) 40 MG tablet Take 40 mg by mouth 2 (two) times daily.   . carvedilol (COREG) 6.25 MG tablet Take 1 tablet (6.25 mg total) by mouth  2 (two) times daily.  . furosemide (LASIX) 40 MG tablet Take 1 tablet (40 mg total) by mouth daily. (Patient taking differently: Take 20 mg by mouth daily. )  . glipiZIDE (GLUCOTROL) 10 MG tablet Take 1 tablet (10 mg total) by mouth 2 (two) times daily before a meal.  . Insulin Glargine (LANTUS SOLOSTAR) 100 UNIT/ML Solostar Pen Inject 30 Units into the skin daily.  . sacubitril-valsartan (ENTRESTO) 24-26 MG Take 1 tablet by mouth 2 (two) times daily.  Marland Kitchen spironolactone (ALDACTONE) 25 MG tablet Take 0.5 tablets (12.5 mg total) by mouth daily.  . ticagrelor (BRILINTA) 60 MG TABS tablet Take 1 tablet (60 mg total) by mouth 2 (two) times daily.  Marland Kitchen atorvastatin (LIPITOR) 80 MG tablet Take 1 tablet (80 mg total) by mouth daily. (Patient not taking: Reported on 10/31/2019)  . blood glucose meter kit and supplies Dispense based on patient and insurance preference. Use up to four times daily as directed. (FOR ICD-10 E10.9, E11.9).  Marland Kitchen Blood Glucose Monitoring Suppl (TRUE METRIX METER) DEVI 1 kit by Does not apply route 4 (four) times daily.  Marland Kitchen gabapentin (NEURONTIN) 300 MG capsule Take 1 capsule (300 mg total) by mouth at bedtime. (Patient not taking: Reported on 10/31/2019)  . glucose blood test strip Use as instructed  . nitroGLYCERIN (NITROSTAT) 0.4 MG SL tablet Place 1 tablet (0.4 mg total) under the tongue every 5 (five) minutes as needed for chest pain. Reported on 11/25/2015  . sacubitril-valsartan (ENTRESTO) 49-51 MG Take 1 tablet by mouth 2 (two) times daily. (Patient not taking: Reported on 10/31/2019)  . TRUEPLUS LANCETS 28G MISC 28 g by Does not apply route 4 (four) times daily.    FAMILY HISTORY:   Her family history includes Diabetes in her mother,  sister, sister, son, and son.  SOCIAL HISTORY:  She  reports that she has been smoking cigarettes. She has been smoking about 0.00 packs per day for the past 22.00 years. She has never used smokeless tobacco. She reports current alcohol use. She reports current drug use. Drug: Marijuana.  REVIEW OF SYSTEMS:    Unable to perform due to patient intubated.Welford Roche, MD  Internal Medicine PGY-3

## 2019-11-01 NOTE — Progress Notes (Signed)
Initial Nutrition Assessment  DOCUMENTATION CODES:   Obesity unspecified  INTERVENTION:   Change to Vital HP @40ml /hr + Prostat 75ml TID via tube   Free water flushes 50ml q4 hours to maintain tube patency   Propofol: 13.28 ml/hr- provides 350kcal/day  Regimen provides 1610kcal/day, 129g/day protein, 924ml/day free water   Liquid MVI daily via tube   NUTRITION DIAGNOSIS:   Inadequate oral intake related to inability to eat(pt sedated and ventilated) as evidenced by NPO status.  GOAL:   Provide needs based on ASPEN/SCCM guidelines  MONITOR:   Vent status, Labs, Weight trends, Skin, I & O's  REASON FOR ASSESSMENT:   Consult Enteral/tube feeding initiation and management  ASSESSMENT:   49 y.o. female with a pertinent past medical HTN, type 2 diabetes, hyperlipidemia, HFpEF, STEMI, NSTEMI PCD, tobacco use disorder who presented to [facility] in acute hypoxic respiratory failure and signs and symptoms of hypervolemia including pulmonary edema   RD working remotely.  Pt sedated and ventilated. OGT in place. Tube feed protocol initiated, pt tolerating well. Per chart, pt appears fairly weight stable at baseline.   Medications reviewed and include: lovenox, lasix, insulin, azithromycin, ceftriaxone, propofol  Labs reviewed: K 3.1(L), P 2.7 wnl, Mg 2.4 wnl Wbc- 15.9(H) cbgs- 143, 133, 159, 172, 137 x 24 hrs AIC 8.2(H)- 11/2018  Patient is currently intubated on ventilator support MV: 10.4 L/min Temp (24hrs), Avg:97.8 F (36.6 C), Min:96.4 F (35.8 C), Max:98.4 F (36.9 C)  Propofol: 13.28 ml/hr- provides 350kcal/day   MAP- >5mmHg  UOP- 3579ml output  NUTRITION - FOCUSED PHYSICAL EXAM: Unable to complete at this time   Diet Order:   Diet Order    None     EDUCATION NEEDS:   No education needs have been identified at this time  Skin:  Skin Assessment: Reviewed RN Assessment  Last BM:  pta  Height:   Ht Readings from Last 1 Encounters:  10/31/19  5\' 5"  (1.651 m)    Weight:   Wt Readings from Last 1 Encounters:  10/31/19 110.7 kg    Ideal Body Weight:  56.8 kg  BMI:  Body mass index is 40.61 kg/m.  Estimated Nutritional Needs:   Kcal:  1218-1550kcal/day  Protein:  >115g/day  Fluid:  >1.7L/day  Koleen Distance MS, RD, LDN Please refer to [facility] for RD and/or RD on-call/weekend/after hours pager

## 2019-11-01 NOTE — Progress Notes (Signed)
.  [facility] ADULT ICU REPLACEMENT PROTOCOL FOR AM LAB REPLACEMENT ONLY  The patient does apply for the [facility] Adult ICU Electrolyte Replacment Protocol based on the criteria listed below:   1. Is GFR >/= 40 ml/min? Yes.    Patient's GFR today is >60 2. Is urine output >/= 0.5 ml/kg/hr for the last 6 hours? Yes.   Patient's UOP is .6 ml/kg/hr 3. Is BUN < 60 mg/dL? Yes.    Patient's BUN today is 15 4. Abnormal electrolyte(s): K-3.3 5. Ordered repletion with: per protocol 6. If a panic level lab has been reported, has the CCM MD in charge been notified? Yes.  .   Physician:  Dr Lonia Blood, Philis Nettle 11/01/2019 5:49 AM

## 2019-11-02 ENCOUNTER — Inpatient Hospital Stay (HOSPITAL_COMMUNITY): Payer: Self-pay

## 2019-11-02 DIAGNOSIS — I5021 Acute systolic (congestive) heart failure: Secondary | ICD-10-CM

## 2019-11-02 LAB — GLUCOSE, CAPILLARY
Glucose-Capillary: 120 mg/dL — ABNORMAL HIGH (ref 70–99)
Glucose-Capillary: 125 mg/dL — ABNORMAL HIGH (ref 70–99)
Glucose-Capillary: 131 mg/dL — ABNORMAL HIGH (ref 70–99)
Glucose-Capillary: 144 mg/dL — ABNORMAL HIGH (ref 70–99)
Glucose-Capillary: 152 mg/dL — ABNORMAL HIGH (ref 70–99)
Glucose-Capillary: 157 mg/dL — ABNORMAL HIGH (ref 70–99)
Glucose-Capillary: 167 mg/dL — ABNORMAL HIGH (ref 70–99)
Glucose-Capillary: 175 mg/dL — ABNORMAL HIGH (ref 70–99)
Glucose-Capillary: 190 mg/dL — ABNORMAL HIGH (ref 70–99)
Glucose-Capillary: 193 mg/dL — ABNORMAL HIGH (ref 70–99)
Glucose-Capillary: 199 mg/dL — ABNORMAL HIGH (ref 70–99)
Glucose-Capillary: 206 mg/dL — ABNORMAL HIGH (ref 70–99)
Glucose-Capillary: 249 mg/dL — ABNORMAL HIGH (ref 70–99)
Glucose-Capillary: 260 mg/dL — ABNORMAL HIGH (ref 70–99)
Glucose-Capillary: 281 mg/dL — ABNORMAL HIGH (ref 70–99)

## 2019-11-02 LAB — BASIC METABOLIC PANEL
Anion gap: 11 (ref 5–15)
BUN: 27 mg/dL — ABNORMAL HIGH (ref 6–20)
CO2: 27 mmol/L (ref 22–32)
Calcium: 9.1 mg/dL (ref 8.9–10.3)
Chloride: 103 mmol/L (ref 98–111)
Creatinine, Ser: 0.91 mg/dL (ref 0.44–1.00)
GFR calc Af Amer: >60 mL/min (ref >60)
GFR calc non Af Amer: >60 mL/min (ref >60)
Glucose, Bld: 289 mg/dL — ABNORMAL HIGH (ref 70–99)
Potassium: 3.4 mmol/L — ABNORMAL LOW (ref 3.5–5.1)
Sodium: 141 mmol/L (ref 135–145)

## 2019-11-02 LAB — CBC
HCT: 34.8 % — ABNORMAL LOW (ref 36.0–46.0)
Hemoglobin: 10.9 g/dL — ABNORMAL LOW (ref 12.0–15.0)
MCH: 26.7 pg (ref 26.0–34.0)
MCHC: 31.3 g/dL (ref 30.0–36.0)
MCV: 85.1 fL (ref 80.0–100.0)
Platelets: 353 10*3/uL (ref 150–400)
RBC: 4.09 MIL/uL (ref 3.87–5.11)
RDW: 14.6 % (ref 11.5–15.5)
WBC: 12.4 10*3/uL — ABNORMAL HIGH (ref 4.0–10.5)
nRBC: 0 % (ref 0.0–0.2)

## 2019-11-02 MED ORDER — INSULIN GLARGINE 100 UNIT/ML ~~LOC~~ SOLN
30.0000 [IU] | Freq: Every day | SUBCUTANEOUS | Status: DC
  Administered 2019-11-02 – 2019-11-04 (×3): 30 [IU] via SUBCUTANEOUS
  Filled 2019-11-02 (×3): qty 0.3

## 2019-11-02 MED ORDER — DEXTROSE-NACL 5-0.45 % IV SOLN: INTRAVENOUS | Status: DC

## 2019-11-02 MED ORDER — PANTOPRAZOLE SODIUM 40 MG IV SOLR
40.0000 mg | Freq: Two times a day (BID) | INTRAVENOUS | Status: DC
  Administered 2019-11-02 (×2): 40 mg via INTRAVENOUS
  Filled 2019-11-02 (×2): qty 40

## 2019-11-02 MED ORDER — POTASSIUM CHLORIDE 20 MEQ/15ML (10%) PO SOLN
20.0000 meq | Freq: Three times a day (TID) | ORAL | Status: AC
  Administered 2019-11-02 (×3): 20 meq
  Filled 2019-11-02 (×3): qty 15

## 2019-11-02 MED ORDER — INSULIN ASPART 100 UNIT/ML ~~LOC~~ SOLN: 4.0000 [IU] | Freq: Four times a day (QID) | SUBCUTANEOUS | Status: DC

## 2019-11-02 MED ORDER — INSULIN ASPART 100 UNIT/ML ~~LOC~~ SOLN: 4.0000 [IU] | Freq: Four times a day (QID) | SUBCUTANEOUS | Status: DC | PRN

## 2019-11-02 MED ORDER — ORAL CARE MOUTH RINSE
15.0000 mL | Freq: Two times a day (BID) | OROMUCOSAL | Status: DC
  Administered 2019-11-02: 15 mL via OROMUCOSAL

## 2019-11-02 MED ORDER — MAGNESIUM SULFATE 2 GM/50ML IV SOLN
2.0000 g | Freq: Once | INTRAVENOUS | Status: AC
  Administered 2019-11-02: 2 g via INTRAVENOUS
  Filled 2019-11-02: qty 50

## 2019-11-02 MED ORDER — INSULIN REGULAR(HUMAN) IN NACL 100-0.9 UT/100ML-% IV SOLN
INTRAVENOUS | Status: DC
  Administered 2019-11-02: 7.5 [IU]/h via INTRAVENOUS
  Filled 2019-11-02: qty 100

## 2019-11-02 MED ORDER — DEXTROSE 50 % IV SOLN: 0.0000 mL | INTRAVENOUS | Status: DC | PRN

## 2019-11-02 MED ORDER — POTASSIUM CHLORIDE 20 MEQ/15ML (10%) PO SOLN
20.0000 meq | ORAL | Status: DC
  Filled 2019-11-02: qty 15

## 2019-11-02 MED ORDER — SODIUM CHLORIDE 0.9 % IV SOLN: INTRAVENOUS | Status: DC

## 2019-11-02 MED ORDER — INSULIN ASPART 100 UNIT/ML ~~LOC~~ SOLN
3.0000 [IU] | SUBCUTANEOUS | Status: DC
  Administered 2019-11-02: 3 [IU] via SUBCUTANEOUS
  Administered 2019-11-02: 6 [IU] via SUBCUTANEOUS
  Administered 2019-11-03: 9 [IU] via SUBCUTANEOUS
  Administered 2019-11-03: 3 [IU] via SUBCUTANEOUS
  Administered 2019-11-03: 6 [IU] via SUBCUTANEOUS
  Administered 2019-11-03: 3 [IU] via SUBCUTANEOUS
  Administered 2019-11-03 (×2): 9 [IU] via SUBCUTANEOUS
  Administered 2019-11-04: 6 [IU] via SUBCUTANEOUS

## 2019-11-02 MED ORDER — INSULIN GLARGINE 100 UNIT/ML ~~LOC~~ SOLN
5.0000 [IU] | Freq: Once | SUBCUTANEOUS | Status: AC
  Administered 2019-11-02: 5 [IU] via SUBCUTANEOUS
  Filled 2019-11-02: qty 0.05

## 2019-11-02 NOTE — Progress Notes (Signed)
PULMONARY / CRITICAL CARE MEDICINE   NAME:  Amy Chaney, MRN:  [id number], DOB:  01-Apr-1971, LOS: 2 ADMISSION DATE:  10/31/2019, CONSULTATION DATE: 10/31/2019 REFERRING MD:  Charlesetta Shanks, CHIEF COMPLAINT: Hypoxia  BRIEF HISTORY:    Amy Chaney is a 49 y.o. female with a pertinent past medical HTN, type 2 diabetes, hyperlipidemia, HFpEF, STEMI, NSTEMI PCD, tobacco use disorder who presented to [facility] in acute hypoxic respiratory failure and signs and symptoms of hypervolemia including pulmonary edema  HISTORY OF PRESENT ILLNESS   Amy Chaney is a 49 y.o. female with a pertinent past medical HTN, T2DM,  HLD, HFrEF, STEMI s/p multiple stents, NSTEMI PCD, tobacco use disorder who presented to [facility] unresponsive with hypoxia.  I spoke to the patient's sister, Amy Chaney, who said that patient has been feeling poorly for the last couple days.  She has been short of breath, fatigued, with left-sided chest pain radiating to left arm.  Patient went to the ED on Tuesday for the symptoms but ended up going home due to long wait.  Her symptoms continued to progress until today when she called an ambulance to take her to the hospital.  In the ambulance the EMTs noted her to be alert with significant dyspnea and hypoxia with SPO2 is in the high 80s to low 90s.  On presentation at the hospital the patient was unresponsive with with normal pulses and SPO2 in the 90% and hypertensive at 190/83. CXR was significant for bilateral pulmonary edema. Blood gas showed significant hypercarbic respiratory acidosis and the patient was quickly intubated.  Previous admission similar to this on 12/18/2018 with flash pulmonary edema and hypertensive emergency.  SIGNIFICANT PAST MEDICAL HISTORY   Essential hypertension Type 2 diabetes mellitus Hyperlipidemia HFrEF (01/2019 - EF of 45-50%) STEMI - LAD/diagonal (09/2017), RCA (03/2013) NSTEMI CAD s/p stents in LAD x3 (09/2017 & 06/2018), diagonal (09/2017), and RCA  (01/2013) Tobacco use disorder (22+ pack year history)  SIGNIFICANT EVENTS:  04/02 RSI and CVC placed >    STUDIES:   Chest XR: Tube positions as described without pneumothorax. Cardiomegaly. Perihilar airspace opacity likely represents alveolar edema bilaterally. A degree of superimposed pneumonia cannot be excluded.Etiology for the edema not certain, although a degree of congestive heart failure may well be present.  EKG: Sinus tachycardia  Echocardiogram: Global reduction in LV systolic function with an EF of 20-25% LV mildly dilated Grade 3 diastolic dysfunction Moderately elevated pulmonary arterial pressures  Left atrium mildly enlarged IVC normal in size with less than 50% respiratory variation  CULTURES:  4/2 blood cultures> no growth to date   ANTIBIOTICS:  4/2-4/4 ceftriaxone and azithromycin   LINES/TUBES:  4/2 CVC> 4/2 ET tube> 4/4  CONSULTANTS:  4/2 cardiology consulted in the ED  SUBJECTIVE:  Patient awake this AM and able to follow commands. No complaints. Passed SBT.   CONSTITUTIONAL: BP 130/72   Pulse 69   Temp 98.4 F (36.9 C)   Resp (!) 24   Ht 5' 5"  (1.651 m)   Wt 110.7 kg   LMP 10/20/2015   SpO2 100%   BMI 40.61 kg/m   I/O last 3 completed shifts: In: 3532.3 [I.V.:1702.5; NG/GT:846; IV Piggyback:983.8] Out: 5000 [Urine:5000]     Vent Mode: PRVC FiO2 (%):  [40 %] 40 % Set Rate:  [24 bmp] 24 bmp Vt Set:  [440 mL] 440 mL PEEP:  [5 cmH20] 5 cmH20 Pressure Support:  [5 cmH20] 5 cmH20 Plateau Pressure:  [15 cmH20] 15 cmH20  PHYSICAL  EXAM: General: Intubated but alert and awake this morning, able to follow commands Neuro: Answers questions appropriately, moving all 4 extremities, following commands Cardiovascular: RRR, normal S1/S2, no MRG, no JVD  Lungs: Rhonchorous breath sounds bilaterally improved from yesterday Abdomen: abdomen is obese, but soft and nontender, normoactive bowel sounds  Ext: 1+pitting edema bilaterally     RESOLVED PROBLEM LIST   ASSESSMENT AND PLAN    # Hypertensive Emergency in the setting of medication non-adherence  - BP improved since yesterday after resumption of home Coreg, Entresto, and spironolactone; will titrate up as needed  - Weaned off of nitroglycerin gtt - Diuresing with IV Lasix 60 mg BID per cardiology recs   # Acute Hypoxic Respiratory Failure - Thought to be secondary to flash pulmonary edema  - Repeat CXR today with significant improvement of opacities suggestive of edema vs infection  - Stop Rocephin and azithromycin  - Passed SBT, extubated this AM and doing well on 4L [place]   # HFrEF with newly reduced EF # CAD s/p PCI  - Found to have newly reduced EF of 20% from 45% in 01/2019. Could be secondary to long-term uncontrolled HTN vs new ischemic event given her cardiac history and ST changes on EKG though troponin only 18.  - Cardiology following, appreciate recommendations  - Diuresing with IV Lasix 60 mg BID. Unfortunately net output since yesterday was -80 cc because she received IVF when switched to endotool. However, she is net -3L since admission   - Stop IVF and continue diuresis  - Plan for [facility] during admission to evaluate new reduction in EF  - Resume home atorvastatin now that she is extubated per cards recs  - Holding Brillinta due to drop in Hgb   # Normocytic anemia:  - Hgb has been trending down since admission, baseline is 13 and currently at 10.9  - there does not appear to be any active bleeding at this time: UA without RBC, getting FOBT - BUN slightly elevated, upper GIB>> -> start IV PPI   - Ferritin and iron studies ordered for tomorrow  - CBC in AM   # Hyperglycemia, Type 2 diabetes mellitus - Switched to endotool overnight due to hyperglycemia, will switch back to SQ insulin and advised against endotool in the future unless patient is on DKA  - Start Lantus 30 units QHS  - Resistant- SSI w/ CBG checks q4h  - Can add meal coverage if  needed once she is tolerating PO intake   # Prolonged QT:  - QTC 558 ms - EKG in AM  - Avoid QT prolonging meds - Repleting K and Mag PRN  - Would expect it to improve now that she is off antibiotics    Best Practice / Goals of Care / Disposition.   DVT PROPHYLAXIS: Enoxaparin 40 mg NUTRITION: N.p.o. MOBILITY: PT GOALS OF CARE: Full code FAMILY DISCUSSIONS: patient updated  DISPOSITION Telemetry tomorrow   LABS  Glucose Recent Labs  Lab 11/01/19 0943 11/01/19 1239 11/01/19 1546 11/01/19 1926 11/01/19 2346 11/02/19 0341  GLUCAP 137* 291* 356* 256* 283* 260*    BMET Recent Labs  Lab 10/31/19 0858 10/31/19 0858 10/31/19 0923 10/31/19 1000 10/31/19 1114 10/31/19 1409 10/31/19 1630 11/01/19 0301 11/01/19 0337  NA 136   < > 139   < >  --    < > 141 138 141  K 3.7   < > 4.1   < >  --    < > 3.5 3.3* 3.1*  CL 103   < > 104  --   --   --  105 102  --   CO2 18*  --   --   --   --   --  23 25  --   BUN 12   < > 15  --   --   --  13 15  --   CREATININE 1.04*   < > 0.80   < > 1.03*  --  0.89 0.83  --   GLUCOSE 504*   < > 517*  --   --   --  182* 232*  --    < > = values in this interval not displayed.    Liver Enzymes Recent Labs  Lab 10/31/19 0858 10/31/19 1630  AST 32 23  ALT 17 19  ALKPHOS 45 43  BILITOT 1.0 0.5  ALBUMIN 3.2* 3.0*    Electrolytes Recent Labs  Lab 10/31/19 0858 10/31/19 1114 10/31/19 1630 11/01/19 0301 11/01/19 2114  CALCIUM 8.4*  --  8.3* 8.4*  --   MG 1.6*   < > 2.2 2.4 1.9  PHOS 6.0*   < > 2.7 2.7 3.3   < > = values in this interval not displayed.    CBC Recent Labs  Lab 10/31/19 1630 10/31/19 1630 11/01/19 0301 11/01/19 0337 11/02/19 0459  WBC 19.2*  --  15.9*  --  12.4*  HGB 11.6*   < > 11.2* 11.9* 10.9*  HCT 37.5   < > 35.4* 35.0* 34.8*  PLT 381  --  360  --  353   < > = values in this interval not displayed.    ABG Recent Labs  Lab 10/31/19 1000 10/31/19 1409 11/01/19 0337  PHART 7.196* 7.397 7.437   PCO2ART 67.8* 42.6 38.8  PO2ART 87.0 266.0* 106.0    Coag's No results for input(s): APTT, INR in the last 168 hours.  Sepsis Markers Recent Labs  Lab 10/31/19 0859 10/31/19 1157  LATICACIDVEN 4.1* 1.2    Cardiac Enzymes No results for input(s): TROPONINI, PROBNP in the last 168 hours.  PAST MEDICAL HISTORY :   She  has a past medical history of Coronary artery disease involving native coronary artery of native heart with angina pectoris ([place]) (01/2013), Daily headache, Depression, Dyslipidemia, goal LDL below 70, History of Doppler ultrasound, History of Takotsubo cardiomyopathy (11/2018), Hypertension, Ischemic cardiomyopathy (06/2018), Mild dysplasia of cervix ([place]) (02/04/2019), STEMI involving left anterior descending coronary artery ([place]) (09/2017; 06/2018), STEMI involving oth coronary artery of inferior wall ([place]) (03/2013), Tobacco abuse (01/11/2016), and Type II diabetes mellitus ([place]) (12/2010).  PAST SURGICAL HISTORY:  She  has a past surgical history that includes Cholecystectomy (~ 2000); Tubal ligation (1994); left heart catheterization with coronary angiogram (N/A, 02/25/2013); left heart catheterization with coronary angiogram (N/A, 04/01/2013); percutaneous coronary stent intervention (pci-s) (04/01/2013); LEFT HEART CATH AND CORONARY ANGIOGRAPHY (N/A, 10/10/2017); CORONARY STENT INTERVENTION (N/A, 10/10/2017); NM MYOVIEW LTD (12/2015); Percutaneous coronary stent intervention (pci-s) (02/25/2013); LEFT HEART CATH AND CORONARY ANGIOGRAPHY (N/A, 07/03/2018); CORONARY STENT INTERVENTION (N/A, 07/03/2018); transthoracic echocardiogram (07/04/2018); transthoracic echocardiogram (02/14/2019); and transthoracic echocardiogram (12/18/2018).  No Known Allergies  No current facility-administered medications on file prior to encounter.   Current Outpatient Medications on File Prior to Encounter  Medication Sig  . atorvastatin (LIPITOR) 40 MG tablet Take 40 mg by mouth 2 (two) times  daily.   . carvedilol (COREG) 6.25 MG tablet Take 1 tablet (6.25 mg total) by mouth 2 (two) times daily.  Marland Kitchen  furosemide (LASIX) 40 MG tablet Take 1 tablet (40 mg total) by mouth daily. (Patient taking differently: Take 20 mg by mouth daily. )  . glipiZIDE (GLUCOTROL) 10 MG tablet Take 1 tablet (10 mg total) by mouth 2 (two) times daily before a meal.  . Insulin Glargine (LANTUS SOLOSTAR) 100 UNIT/ML Solostar Pen Inject 30 Units into the skin daily.  . sacubitril-valsartan (ENTRESTO) 24-26 MG Take 1 tablet by mouth 2 (two) times daily.  Marland Kitchen spironolactone (ALDACTONE) 25 MG tablet Take 0.5 tablets (12.5 mg total) by mouth daily.  . ticagrelor (BRILINTA) 60 MG TABS tablet Take 1 tablet (60 mg total) by mouth 2 (two) times daily.  Marland Kitchen atorvastatin (LIPITOR) 80 MG tablet Take 1 tablet (80 mg total) by mouth daily. (Patient not taking: Reported on 10/31/2019)  . blood glucose meter kit and supplies Dispense based on patient and insurance preference. Use up to four times daily as directed. (FOR ICD-10 E10.9, E11.9).  Marland Kitchen Blood Glucose Monitoring Suppl (TRUE METRIX METER) DEVI 1 kit by Does not apply route 4 (four) times daily.  Marland Kitchen gabapentin (NEURONTIN) 300 MG capsule Take 1 capsule (300 mg total) by mouth at bedtime. (Patient not taking: Reported on 10/31/2019)  . glucose blood test strip Use as instructed  . nitroGLYCERIN (NITROSTAT) 0.4 MG SL tablet Place 1 tablet (0.4 mg total) under the tongue every 5 (five) minutes as needed for chest pain. Reported on 11/25/2015  . sacubitril-valsartan (ENTRESTO) 49-51 MG Take 1 tablet by mouth 2 (two) times daily. (Patient not taking: Reported on 10/31/2019)  . TRUEPLUS LANCETS 28G MISC 28 g by Does not apply route 4 (four) times daily.    FAMILY HISTORY:   Her family history includes Diabetes in her mother, sister, sister, son, and son.  SOCIAL HISTORY:  She  reports that she has been smoking cigarettes. She has been smoking about 0.00 packs per day for the past 22.00  years. She has never used smokeless tobacco. She reports current alcohol use. She reports current drug use. Drug: Marijuana.  REVIEW OF SYSTEMS:    Unable to perform due to patient intubated.Welford Roche, MD  Internal Medicine PGY-3

## 2019-11-02 NOTE — Progress Notes (Signed)
[facility] ADULT ICU REPLACEMENT PROTOCOL FOR AM LAB REPLACEMENT ONLY  The patient does apply for the [facility] Adult ICU Electrolyte Replacment Protocol based on the criteria listed below:   1. Is GFR >/= 40 ml/min? Yes.    Patient's GFR today is >60 2. Is urine output >/= 0.5 ml/kg/hr for the last 6 hours? Yes.   Patient's UOP is .5 ml/kg/hr 3. Is BUN < 60 mg/dL? Yes.    Patient's BUN today is 27 4. Abnormal electrolyte(s): K-3.4 5. Ordered repletion with: per protocol 6. If a panic level lab has been reported, has the CCM MD in charge been notified? Yes.  .   Physician:  Dr. Wilson Singer, Philis Nettle 11/02/2019 6:11 AM

## 2019-11-02 NOTE — Progress Notes (Signed)
eLink Physician-Brief Progress Note Patient Name: Amy Chaney DOB: 03-Jun-1971 MRN: [id number]   Date of Service  11/02/2019  HPI/Events of Note  Request to change to sensitive scale insulin. Glucose 283 on resistant dose SSI and additional coverage with meals. Lantus 15 started this afternoon.  eICU Interventions  Explained that patient already on the high dose SSI. Ordered for additional Lantus 5 units. Changed meal time coverage to q 6 since patient is on continuous feeding and maintain SSI on resistant scale     Intervention Category Major Interventions: Hyperglycemia - active titration of insulin therapy  Shona Needles Chaunice Obie 11/02/2019, 1:40 AM

## 2019-11-02 NOTE — Progress Notes (Signed)
Progress Note  Patient Name: Amy Chaney Date of Encounter: 11/02/2019  Primary Cardiologist: Glenetta Hew, MD   Subjective   Intubated  Inpatient Medications    Scheduled Meds: . carvedilol  6.25 mg Per Tube BID WC  . chlorhexidine gluconate (MEDLINE KIT)  15 mL Mouth Rinse BID  . Chlorhexidine Gluconate Cloth  6 each Topical Daily  . enoxaparin (LOVENOX) injection  0.5 mg/kg Subcutaneous Q24H  . feeding supplement (PRO-STAT SUGAR FREE 64)  30 mL Per Tube TID  . feeding supplement (VITAL HIGH PROTEIN)  1,000 mL Per Tube Q24H  . furosemide  60 mg Intravenous BID  . insulin aspart  3-9 Units Subcutaneous Q4H  . insulin aspart  4 Units Subcutaneous Q6H  . insulin glargine  15 Units Subcutaneous QHS  . mouth rinse  15 mL Mouth Rinse 10 times per day  . multivitamin  15 mL Per Tube Daily  . pantoprazole (PROTONIX) IV  40 mg Intravenous Q12H  . potassium chloride  20 mEq Per Tube TID  . sacubitril-valsartan  1 tablet Oral BID  . spironolactone  12.5 mg Per Tube Daily  . ticagrelor  90 mg Per Tube BID   Continuous Infusions: . sodium chloride Stopped (11/02/19 0836)  . dextrose 5 % and 0.45% NaCl    . insulin 2.2 mL/hr at 11/02/19 1000  . nitroGLYCERIN Stopped (11/02/19 0256)  . propofol (DIPRIVAN) infusion 15 mcg/kg/min (11/02/19 1000)   PRN Meds: dextrose, fentaNYL (SUBLIMAZE) injection, midazolam, sodium chloride flush   Vital Signs    Vitals:   11/02/19 0834 11/02/19 0900 11/02/19 0902 11/02/19 1000  BP:  (!) 205/112  (!) 188/99  Pulse:  84  79  Resp:  17  18  Temp:  97.9 F (36.6 C)  98.4 F (36.9 C)  TempSrc:      SpO2: 100% 100% 100% 100%  Weight:      Height:        Intake/Output Summary (Last 24 hours) at 11/02/2019 1024 Last data filed at 11/02/2019 1000 Gross per 24 hour  Intake 2224.67 ml  Output 3255 ml  Net -1030.33 ml   Last 3 Weights 10/31/2019 06/19/2019 02/26/2019  Weight (lbs) 244 lb 0.8 oz 244 lb 231 lb  Weight (kg) 110.7 kg 110.678 kg  104.781 kg      Telemetry    Sinus with rare PVC- Personally Reviewed  Physical Exam   GEN: Intubated Neck: supple Cardiac: RRR Respiratory: CTA anteriorly GI: Soft, non-distended  MS: No edema Neuro:  intubated and sedated   Labs    High Sensitivity Troponin:   Recent Labs  Lab 10/28/19 1540 10/31/19 0858 10/31/19 1157  TROPONINIHS 17 8 18*      Chemistry Recent Labs  Lab 10/31/19 0858 10/31/19 0923 10/31/19 1630 10/31/19 1630 11/01/19 0301 11/01/19 0337 11/02/19 0459  NA 136   < > 141   < > 138 141 141  K 3.7   < > 3.5   < > 3.3* 3.1* 3.4*  CL 103   < > 105  --  102  --  103  CO2 18*   < > 23  --  25  --  27  GLUCOSE 504*   < > 182*  --  232*  --  289*  BUN 12   < > 13  --  15  --  27*  CREATININE 1.04*   < > 0.89  --  0.83  --  0.91  CALCIUM 8.4*   < >  8.3*  --  8.4*  --  9.1  PROT 7.6  --  7.1  --   --   --   --   ALBUMIN 3.2*  --  3.0*  --   --   --   --   AST 32  --  23  --   --   --   --   ALT 17  --  19  --   --   --   --   ALKPHOS 45  --  43  --   --   --   --   BILITOT 1.0  --  0.5  --   --   --   --   GFRNONAA >60   < > >60  --  >60  --  >60  GFRAA >60   < > >60  --  >60  --  >60  ANIONGAP 15   < > 13  --  11  --  11   < > = values in this interval not displayed.     Hematology Recent Labs  Lab 10/31/19 1630 10/31/19 1630 11/01/19 0301 11/01/19 0337 11/02/19 0459  WBC 19.2*  --  15.9*  --  12.4*  RBC 4.36  --  4.23  --  4.09  HGB 11.6*   < > 11.2* 11.9* 10.9*  HCT 37.5   < > 35.4* 35.0* 34.8*  MCV 86.0  --  83.7  --  85.1  MCH 26.6  --  26.5  --  26.7  MCHC 30.9  --  31.6  --  31.3  RDW 14.4  --  14.3  --  14.6  PLT 381  --  360  --  353   < > = values in this interval not displayed.    BNP Recent Labs  Lab 10/31/19 0858  BNP 1,339.2*     Radiology    CT HEAD WO CONTRAST  Result Date: 10/31/2019 CLINICAL DATA:  Mental status change. Hypertensive emergency. Respiratory distress. EXAM: CT HEAD WITHOUT CONTRAST  TECHNIQUE: Contiguous axial images were obtained from the base of the skull through the vertex without intravenous contrast. COMPARISON:  Head CT 12/18/2018 FINDINGS: Brain: No intracranial hemorrhage, mass effect, or midline shift. No hydrocephalus. Stable ventricular size from prior. Cerebellar tonsils extending beyond the foramen magnum unchanged configuration from prior exam consistent with Chiari 1 malformation. No evidence of territorial infarct or acute ischemia. No extra-axial or intracranial fluid collection. Vascular: No hyperdense vessel. Skull: No fracture or focal lesion. Sinuses/Orbits: Mucous retention cyst right maxillary sinus. No acute findings. Other: None. IMPRESSION: 1. No acute intracranial abnormality. 2. 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 Electronically Signed   By: Keith Rake M.D.   On: 10/31/2019 17:30   DG CHEST PORT 1 VIEW  Result Date: 11/02/2019 CLINICAL DATA:  Follow-up respiratory distress.  Intubated patient. EXAM: PORTABLE CHEST 1 VIEW COMPARISON:  10/31/2019 FINDINGS: There has been significant interval improvement. Airspace lung opacities have resolved. There is mild persistent vascular congestion/interstitial prominence, mostly at the bases. No convincing pleural effusion.  No pneumothorax. Endotracheal tube, nasal/orogastric tube and right internal jugular central venous line are stable and well positioned. IMPRESSION: 1. Significant interval improvement with resolution of the airspace lung opacities. Given the rapid improvement, this is most consistent with improved/resolved pulmonary edema. 2. Stable well-positioned support apparatus. Electronically Signed   By: Lajean Manes M.D.   On: 11/02/2019 05:30   DG Chest Portable 1 View  Result Date: 10/31/2019 CLINICAL DATA:  Central line placement EXAM: PORTABLE CHEST 1 VIEW COMPARISON:  10/31/2019 at 0903 hours FINDINGS: Interval placement of a right IJ approach central venous catheter with distal tip  terminating at the level of the distal SVC. Endotracheal tube terminates 4.5 cm superior to the carina. Enteric tube courses below the diaphragm with distal tip within the expected location of the gastric body. Stable mild cardiomegaly. Extensive bilateral airspace opacities, similar to prior. No pneumothorax. IMPRESSION: 1. Interval placement of a right IJ approach central venous catheter with distal tip terminating at the level of the distal SVC. No pneumothorax. 2. Otherwise stable exam. Electronically Signed   By: Davina Poke D.O.   On: 10/31/2019 10:35    Patient Profile     49 y.o. female with past medical history of coronary artery disease status post PCI of LAD and diagonal in 2019 and RCA in 2014, chronic combined systolic/diastolic congestive heart failure, ischemic cardiomyopathy, hypertension, hyperlipidemia, diabetes mellitus, tobacco abuse admitted with hypertensive urgency and respiratory failure.  Echocardiogram shows ejection fraction 20 to 25%, restrictive filling, mild left atrial enlargement, mild mitral regurgitation.  Initial blood pressure severely elevated.  Assessment & Plan    1 acute on chronic systolic congestive heart failure-ejection fraction severely reduced.  Continue IV diuresis and follow renal function.  Chest x-ray improving today.  2 cardiomyopathy-likely combination of ischemic and hypertensive.  Continue carvedilol and Entresto.  Continue spironolactone.  Titrate medications as tolerated.  Plan right and left cardiac catheterization when she improves and is extubated.  3 VDRF-ventilator management per critical care.  4 coronary artery disease-continue Brilinta.  Resume Lipitor when extubated.  5 hypertension-we will continue present medications.  Advance as needed.  For questions or updates, please contact [place] Please consult [email or web address] for contact info under        Signed, Kirk Ruths, MD  11/02/2019, 10:24 AM

## 2019-11-02 NOTE — Progress Notes (Signed)
  Patient alert, following commands, and has been successfully weaning all morning.  Extubated to 4 LPM [place] patient is coughing good and strong, knows where she is and full name.  IS instruction given able to do 500-750cc.  Will continue to monitor.

## 2019-11-03 ENCOUNTER — Encounter (HOSPITAL_COMMUNITY): Payer: Self-pay | Admitting: Emergency Medicine

## 2019-11-03 ENCOUNTER — Inpatient Hospital Stay (HOSPITAL_COMMUNITY): Payer: Self-pay

## 2019-11-03 DIAGNOSIS — N179 Acute kidney failure, unspecified: Secondary | ICD-10-CM

## 2019-11-03 DIAGNOSIS — R69 Illness, unspecified: Secondary | ICD-10-CM

## 2019-11-03 LAB — PHOSPHORUS: Phosphorus: 4 mg/dL (ref 2.5–4.6)

## 2019-11-03 LAB — CBC
HCT: 36.6 % (ref 36.0–46.0)
Hemoglobin: 11.1 g/dL — ABNORMAL LOW (ref 12.0–15.0)
MCH: 26.1 pg (ref 26.0–34.0)
MCHC: 30.3 g/dL (ref 30.0–36.0)
MCV: 85.9 fL (ref 80.0–100.0)
Platelets: 360 10*3/uL (ref 150–400)
RBC: 4.26 MIL/uL (ref 3.87–5.11)
RDW: 14.9 % (ref 11.5–15.5)
WBC: 13.9 10*3/uL — ABNORMAL HIGH (ref 4.0–10.5)
nRBC: 0 % (ref 0.0–0.2)

## 2019-11-03 LAB — TROPONIN I (HIGH SENSITIVITY): Troponin I (High Sensitivity): 6 ng/L (ref <18)

## 2019-11-03 LAB — COMPREHENSIVE METABOLIC PANEL
ALT: 12 U/L (ref 0–44)
AST: 12 U/L — ABNORMAL LOW (ref 15–41)
Albumin: 2.8 g/dL — ABNORMAL LOW (ref 3.5–5.0)
Alkaline Phosphatase: 33 U/L — ABNORMAL LOW (ref 38–126)
Anion gap: 11 (ref 5–15)
BUN: 27 mg/dL — ABNORMAL HIGH (ref 6–20)
CO2: 26 mmol/L (ref 22–32)
Calcium: 8.4 mg/dL — ABNORMAL LOW (ref 8.9–10.3)
Chloride: 103 mmol/L (ref 98–111)
Creatinine, Ser: 1.24 mg/dL — ABNORMAL HIGH (ref 0.44–1.00)
GFR calc Af Amer: 59 mL/min — ABNORMAL LOW (ref >60)
GFR calc non Af Amer: 51 mL/min — ABNORMAL LOW (ref >60)
Glucose, Bld: 119 mg/dL — ABNORMAL HIGH (ref 70–99)
Potassium: 3.5 mmol/L (ref 3.5–5.1)
Sodium: 140 mmol/L (ref 135–145)
Total Bilirubin: 1.6 mg/dL — ABNORMAL HIGH (ref 0.3–1.2)
Total Protein: 7 g/dL (ref 6.5–8.1)

## 2019-11-03 LAB — BASIC METABOLIC PANEL
Anion gap: 12 (ref 5–15)
BUN: 30 mg/dL — ABNORMAL HIGH (ref 6–20)
CO2: 27 mmol/L (ref 22–32)
Calcium: 8.8 mg/dL — ABNORMAL LOW (ref 8.9–10.3)
Chloride: 102 mmol/L (ref 98–111)
Creatinine, Ser: 0.95 mg/dL (ref 0.44–1.00)
GFR calc Af Amer: >60 mL/min (ref >60)
GFR calc non Af Amer: >60 mL/min (ref >60)
Glucose, Bld: 121 mg/dL — ABNORMAL HIGH (ref 70–99)
Potassium: 3.9 mmol/L (ref 3.5–5.1)
Sodium: 141 mmol/L (ref 135–145)

## 2019-11-03 LAB — IRON AND TIBC
Iron: 88 ug/dL (ref 28–170)
Saturation Ratios: 31 % (ref 10.4–31.8)
TIBC: 284 ug/dL (ref 250–450)
UIBC: 196 ug/dL

## 2019-11-03 LAB — GLUCOSE, CAPILLARY
Glucose-Capillary: 126 mg/dL — ABNORMAL HIGH (ref 70–99)
Glucose-Capillary: 118 mg/dL — ABNORMAL HIGH (ref 70–99)
Glucose-Capillary: 249 mg/dL — ABNORMAL HIGH (ref 70–99)
Glucose-Capillary: 170 mg/dL — ABNORMAL HIGH (ref 70–99)
Glucose-Capillary: 229 mg/dL — ABNORMAL HIGH (ref 70–99)
Glucose-Capillary: 227 mg/dL — ABNORMAL HIGH (ref 70–99)

## 2019-11-03 LAB — PATHOLOGIST SMEAR REVIEW: Path Review: REACTIVE

## 2019-11-03 LAB — FERRITIN: Ferritin: 275 ng/mL (ref 11–307)

## 2019-11-03 LAB — MAGNESIUM: Magnesium: 2 mg/dL (ref 1.7–2.4)

## 2019-11-03 LAB — VITAMIN B12: Vitamin B-12: 338 pg/mL (ref 180–914)

## 2019-11-03 MED ORDER — ADULT MULTIVITAMIN W/MINERALS CH
1.0000 | ORAL_TABLET | Freq: Every day | ORAL | Status: DC
  Administered 2019-11-03 – 2019-11-04 (×2): 1 via ORAL
  Filled 2019-11-03 (×2): qty 1

## 2019-11-03 MED ORDER — CARVEDILOL 12.5 MG PO TABS: 6.2500 mg | ORAL_TABLET | Freq: Two times a day (BID) | ORAL | Status: DC

## 2019-11-03 MED ORDER — SPIRONOLACTONE 12.5 MG HALF TABLET
12.5000 mg | ORAL_TABLET | Freq: Every day | ORAL | Status: DC
  Administered 2019-11-04: 12.5 mg via ORAL
  Filled 2019-11-03 (×2): qty 1

## 2019-11-03 MED ORDER — PANTOPRAZOLE SODIUM 40 MG PO TBEC
40.0000 mg | DELAYED_RELEASE_TABLET | Freq: Every day | ORAL | Status: DC
  Administered 2019-11-03 – 2019-11-04 (×2): 40 mg via ORAL
  Filled 2019-11-03 (×2): qty 1

## 2019-11-03 MED ORDER — LACTATED RINGERS IV BOLUS
500.0000 mL | Freq: Once | INTRAVENOUS | Status: AC
  Administered 2019-11-03: 500 mL via INTRAVENOUS

## 2019-11-03 MED ORDER — TICAGRELOR 90 MG PO TABS
90.0000 mg | ORAL_TABLET | Freq: Two times a day (BID) | ORAL | Status: DC
  Administered 2019-11-03 – 2019-11-04 (×3): 90 mg via ORAL
  Filled 2019-11-03 (×3): qty 1

## 2019-11-03 MED ORDER — SODIUM CHLORIDE 0.9% FLUSH
3.0000 mL | Freq: Two times a day (BID) | INTRAVENOUS | Status: DC
  Administered 2019-11-03: 10 mL via INTRAVENOUS
  Administered 2019-11-03 – 2019-11-04 (×2): 3 mL via INTRAVENOUS

## 2019-11-03 MED ORDER — SENNOSIDES-DOCUSATE SODIUM 8.6-50 MG PO TABS
2.0000 | ORAL_TABLET | Freq: Two times a day (BID) | ORAL | Status: DC
  Administered 2019-11-03: 2 via ORAL
  Filled 2019-11-03: qty 2

## 2019-11-03 MED ORDER — POTASSIUM CHLORIDE CRYS ER 20 MEQ PO TBCR
20.0000 meq | EXTENDED_RELEASE_TABLET | ORAL | Status: AC
  Administered 2019-11-03: 20 meq via ORAL
  Filled 2019-11-03 (×2): qty 1

## 2019-11-03 MED ORDER — PNEUMOCOCCAL VAC POLYVALENT 25 MCG/0.5ML IJ INJ: 0.5000 mL | INJECTION | INTRAMUSCULAR | Status: DC

## 2019-11-03 MED ORDER — LACTATED RINGERS IV BOLUS
250.0000 mL | Freq: Once | INTRAVENOUS | Status: AC
  Administered 2019-11-03: 1000 mL via INTRAVENOUS

## 2019-11-03 MED ORDER — POLYETHYLENE GLYCOL 3350 17 G PO PACK: 17.0000 g | PACK | Freq: Two times a day (BID) | ORAL | Status: DC

## 2019-11-03 NOTE — Progress Notes (Signed)
[facility] ADULT ICU REPLACEMENT PROTOCOL FOR AM LAB REPLACEMENT ONLY  The patient does apply for the [facility] Adult ICU Electrolyte Replacment Protocol based on the criteria listed below:   1. Is GFR >/= 40 ml/min? Yes.    Patient's GFR today is 59 2. Is urine output >/= 0.5 ml/kg/hr for the last 6 hours? Yes.   Patient's UOP is 2 ml/kg/hr 3. Is BUN < 60 mg/dL? Yes.    Patient's BUN today is 27 4. Abnormal electrolyte(s): k 3.5 5. Ordered repletion with: protocol by mouth 6. If a panic level lab has been reported, has the CCM MD in charge been notified? No..   Physician:    Ronda Fairly A 11/03/2019 3:55 AM

## 2019-11-03 NOTE — Progress Notes (Signed)
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 Pt requires min A with mobility and LB ADL. Ambulated around unit on RA with SpO2 above 90 without DOE. Will follow acutely to facilitate safe DC home.     11/03/19 1400  OT Visit Information  Last OT Received On 11/03/19  Assistance Needed +1  PT/OT/SLP Co-Evaluation/Treatment Yes  Reason for Co-Treatment For patient/therapist safety;To address functional/ADL transfers  OT goals addressed during session ADL's and self-care  History of Present Illness Pt adm hypertensive emergency with acute systolic and diastolic CHF and hypoxemic respiratory failure. Pt intubated 4/2-4/4. PMH - CAD, heart failure, HTN, STEMI, DM  Precautions  Precautions Fall  Home Living  Family/patient expects to be discharged to: Private residence  Living Arrangements Other relatives (sister)  Available Help at Discharge Family;Available 24 hours/day  Type of Home Apartment  Home Access Stairs to enter  Entrance Stairs-Number of Steps 4  Entrance Stairs-Rails Right  Home Layout One level  Bathroom Biomedical scientist Yes  How Accessible Accessible via walker  Newell held shower head;Grab bars - tub/shower;Grab bars - toilet  Prior Function  Level of Independence Independent  Comments works  Engineer, petroleum No difficulties  Pain Assessment  Pain Assessment No/denies pain  Cognition  Arousal/Alertness Awake/alert  Behavior During Therapy WFL for tasks assessed/performed  Overall Cognitive Status Within Functional Limits for tasks assessed  Upper Extremity Assessment  Upper Extremity Assessment Generalized weakness  Lower Extremity Assessment  Lower Extremity Assessment Defer to PT evaluation  Cervical / Trunk Assessment  Cervical / Trunk Assessment Normal  ADL  Overall  ADL's  Needs assistance/impaired  Grooming Set up;Sitting  Upper Body Bathing Set up;Sitting  Lower Body Bathing Minimal assistance;Sit to/from stand  Upper Body Dressing  Set up;Sitting  Lower Body Dressing Minimal assistance;Sit to/from Retail buyer Minimal assistance;BSC  Toileting- Water quality scientist and Hygiene Supervision/safety;Sit to/from stand  Functional mobility during ADLs Minimal assistance (rollator)  General ADL Comments Reports increased shortness of breath when bending over. May benefit form reacher  Bed Mobility  Overal bed mobility Needs Assistance  Bed Mobility Sidelying to Sit  Sidelying to sit Min assist  General bed mobility comments Assist to elevate trunk into sitting  Transfers  Overall transfer level Needs assistance  Equipment used 4-wheeled walker  Transfers Sit to/from Stand  Sit to Stand Min assist;+2 safety/equipment  General transfer comment Assist for stability and to bring hips up  Balance  Overall balance assessment Mild deficits observed, not formally tested  OT - End of Session  Equipment Utilized During Treatment Gait belt (rollaotr)  Activity Tolerance Patient tolerated treatment well  Patient left in chair;with call bell/phone within reach  Nurse Communication Mobility status  OT Assessment  OT Recommendation/Assessment Patient needs continued OT Services  OT Visit Diagnosis Unsteadiness on feet (R26.81);Muscle weakness (generalized) (M62.81)  OT Problem List Decreased strength;Decreased activity tolerance;Decreased knowledge of use of DME or AE;Cardiopulmonary status limiting activity  OT Plan  OT Frequency (ACUTE ONLY) Min 3X/week  OT Treatment/Interventions (ACUTE ONLY) Self-care/ADL training;Therapeutic exercise;Neuromuscular education;Energy conservation;DME and/or AE instruction;Therapeutic activities;Patient/family education  AM-PAC OT "6 Clicks" Daily Activity Outcome Measure (Version 2)  Help from another person  eating meals? 4  Help from another person taking care of personal grooming? 3  Help from another person toileting, which includes using toliet, bedpan, or urinal?  3  Help from another person bathing (including washing, rinsing, drying)? 3  Help from another person to put on and taking off regular upper body clothing? 3  Help from another person to put on and taking off regular lower body clothing? 3  6 Click Score 19  OT Recommendation  Follow Up Recommendations No OT follow up;Supervision - Intermittent  OT Equipment 3 in 1 bedside commode  Individuals Consulted  Consulted and Agree with Results and Recommendations Patient  Acute Rehab OT Goals  Patient Stated Goal return home  OT Goal Formulation With patient  Time For Goal Achievement 11/17/19  Potential to Achieve Goals Good  OT Time Calculation  OT Start Time (ACUTE ONLY) 1123  OT Stop Time (ACUTE ONLY) 1156  OT Time Calculation (min) 33 min  OT General Charges  $OT Visit 1 Visit  OT Evaluation  $OT Eval Moderate Complexity 1 Mod  Written Expression  Dominant Hand Right  Maurie Boettcher, OT/L   Acute OT Clinical Specialist Acute Rehabilitation Services Pager [phone] Office [phone]

## 2019-11-03 NOTE — Progress Notes (Addendum)
PULMONARY / CRITICAL CARE MEDICINE   NAME:  Amy Chaney, MRN:  [id number], DOB:  10/08/1970, LOS: 3 ADMISSION DATE:  10/31/2019, CONSULTATION DATE: 10/31/2019 REFERRING MD:  Charlesetta Shanks, CHIEF COMPLAINT: Hypoxia  BRIEF HISTORY:    Amy Chaney is a 49 y.o. female with a pertinent past medical HTN, type 2 diabetes, hyperlipidemia, HFpEF, STEMI, NSTEMI PCD, tobacco use disorder who presented to [facility] in acute hypoxic respiratory failure and signs and symptoms of hypervolemia including pulmonary edema  HISTORY OF PRESENT ILLNESS   Amy Chaney is a 49 y.o. female with a pertinent past medical HTN, T2DM,  HLD, HFrEF, STEMI s/p multiple stents, NSTEMI PCD, tobacco use disorder who presented to [facility] unresponsive with hypoxia.  I spoke to the patient's sister, [name], who said that patient has been feeling poorly for the last couple days.  She has been short of breath, fatigued, with left-sided chest pain radiating to left arm.  Patient went to the ED on Tuesday for the symptoms but ended up going home due to long wait.  Her symptoms continued to progress until today when she called an ambulance to take her to the hospital.  In the ambulance the EMTs noted her to be alert with significant dyspnea and hypoxia with SPO2 is in the high 80s to low 90s.  On presentation at the hospital the patient was unresponsive with with normal pulses and SPO2 in the 90% and hypertensive at 190/83. CXR was significant for bilateral pulmonary edema. Blood gas showed significant hypercarbic respiratory acidosis and the patient was quickly intubated.  Previous admission similar to this on 12/18/2018 with flash pulmonary edema and hypertensive emergency.  SIGNIFICANT PAST MEDICAL HISTORY   Essential hypertension Type 2 diabetes mellitus Hyperlipidemia HFrEF (01/2019 - EF of 45-50%) STEMI - LAD/diagonal (09/2017), RCA (03/2013) NSTEMI CAD s/p stents in LAD x3 (09/2017 & 06/2018), diagonal (09/2017), and RCA  (01/2013) Tobacco use disorder (22+ pack year history)  SIGNIFICANT EVENTS:  04/02 RSI and CVC placed >    STUDIES:   Chest XR: Tube positions as described without pneumothorax. Cardiomegaly. Perihilar airspace opacity likely represents alveolar edema bilaterally. A degree of superimposed pneumonia cannot be excluded.Etiology for the edema not certain, although a degree of congestive heart failure may well be present.  EKG: Sinus tachycardia  Echocardiogram: Global reduction in LV systolic function with an EF of 20-25% LV mildly dilated Grade 3 diastolic dysfunction Moderately elevated pulmonary arterial pressures  Left atrium mildly enlarged IVC normal in size with less than 50% respiratory variation  CULTURES:  4/2 blood cultures> no growth to date   ANTIBIOTICS:  4/2-4/4 ceftriaxone and azithromycin   LINES/TUBES:  4/2 CVC> 4/2 ET tube> 4/4  CONSULTANTS:  4/2 cardiology consulted in the ED  SUBJECTIVE:  Patient awake this AM and able to follow commands. Complaining of non-radiating substernal chest pain and dizziness. Ate apple sauce yesterday and tolerated fine.   CONSTITUTIONAL: BP 126/80   Pulse 81   Temp 97.9 F (36.6 C) (Oral)   Resp 18   Ht 5' 5"  (1.651 m)   Wt 110.7 kg   LMP 10/20/2015   SpO2 100%   BMI 40.61 kg/m   I/O last 3 completed shifts: In: 5597 [P.O.:120; I.V.:465; Other:40; NG/GT:780; IV Piggyback:60.1] Out: 4163 [Urine:3695]  CVP:  [2 mmHg] 2 mmHg    PHYSICAL EXAM: General: sleeping comfortably in bed in NAD  Neuro: A&O x3, answering questions appropriately, no motor or sensory deficits Cardiovascular: RRR, normal A4/T3, II/VI systolic  murmur  Lungs: CTAB, no wheezes or crackles  Abdomen: abdomen is obese, but soft and nontender, normoactive bowel sounds  Ext: no lower extremity pitting edema    RESOLVED PROBLEM LIST   ASSESSMENT AND PLAN    # Hypertensive Emergency in the setting of medication non-adherence  - Resolved, now  hypotensive due to overdiuresis  - LR 250cc bolus given overnight, ordered another 500 cc bolus  - Will hold off BP meds this AM (coreg, entresto, and spiro), resume in PM If appropriate  - Hold diuresis today    # Acute Hypoxic Respiratory Failure - Thought to be secondary to flash pulmonary edema  - Repeat CXR yesterday with significant improvement of opacities suggestive of edema vs infection  - Extubated yesterday, doing well on room air   # HFrEF with newly reduced EF # CAD s/p PCI  # Chest pain  - Cardiology following, appreciate recommendations  - Found to have newly reduced EF of 20% from 45% in 01/2019. Could be secondary to long-term uncontrolled HTN vs new ischemic event given her cardiac history and ST changes on EKG though troponin only 18 on admission  - Complaining of chest pain today with new TWI in V3 and more pronounced on V2, troponin is 6, plan for [facility] tomorrow  - Holding diuresis today in the setting of hypotension and new AKI  - She is net - 2.2L  - Plan for [facility] tomorrow  - Continue atorvastatin and Brillinta  # Normocytic anemia:  - Hgb has been trending down since admission, baseline is 13 and currently at 10.9  - Ferritin, iron panel, B12, and folate within nl range  -  Hgb stable this AM, 11.1 - There does not appear to be any active bleeding at this time. Suspect this is secondary to blood draws  # Hyperglycemia, Type 2 diabetes mellitus - CBG improved with increase in Lantus  - Continue Lantus 30 units QHS and resistant- SSI w/ CBG checks q4h  - Can add meal coverage if needed   # Prolonged QT:  - QTC 558 ms --> 520 ms today  - Avoid QT prolonging meds - Repleting K and Mag as needed  - Would expect it to continue to improve now that she is off antibiotics   # Constipation  - No BM x 2 days  - Start Miralax BID and Senokot 2 tabs BID    Best Practice / Goals of Care / Disposition.   DVT PROPHYLAXIS: Enoxaparin 40 mg NUTRITION: CM diet   MOBILITY: PT GOALS OF CARE: Full code FAMILY DISCUSSIONS: patient updated  DISPOSITION Telemetry tomorrow   LABS  Glucose Recent Labs  Lab 11/02/19 1840 11/02/19 2051 11/02/19 2350 11/03/19 0342 11/03/19 0815 11/03/19 1156  GLUCAP 175* 193* 144* 126* 118* 249*    BMET Recent Labs  Lab 11/02/19 0459 11/03/19 0120 11/03/19 0720  NA 141 140 141  K 3.4* 3.5 3.9  CL 103 103 102  CO2 27 26 27   BUN 27* 27* 30*  CREATININE 0.91 1.24* 0.95  GLUCOSE 289* 119* 121*    Liver Enzymes Recent Labs  Lab 10/31/19 0858 10/31/19 1630 11/03/19 0120  AST 32 23 12*  ALT 17 19 12   ALKPHOS 45 43 33*  BILITOT 1.0 0.5 1.6*  ALBUMIN 3.2* 3.0* 2.8*    Electrolytes Recent Labs  Lab 11/01/19 0301 11/01/19 0301 11/01/19 2114 11/02/19 0459 11/03/19 0120 11/03/19 0720  CALCIUM 8.4*   < >  --  9.1 8.4* 8.8*  MG 2.4  --  1.9  --  2.0  --   PHOS 2.7  --  3.3  --  4.0  --    < > = values in this interval not displayed.    CBC Recent Labs  Lab 11/01/19 0301 11/01/19 0301 11/01/19 0337 11/02/19 0459 11/03/19 0720  WBC 15.9*  --   --  12.4* 13.9*  HGB 11.2*   < > 11.9* 10.9* 11.1*  HCT 35.4*   < > 35.0* 34.8* 36.6  PLT 360  --   --  353 360   < > = values in this interval not displayed.    ABG Recent Labs  Lab 10/31/19 1000 10/31/19 1409 11/01/19 0337  PHART 7.196* 7.397 7.437  PCO2ART 67.8* 42.6 38.8  PO2ART 87.0 266.0* 106.0    Coag's No results for input(s): APTT, INR in the last 168 hours.  Sepsis Markers Recent Labs  Lab 10/31/19 0859 10/31/19 1157  LATICACIDVEN 4.1* 1.2    Cardiac Enzymes No results for input(s): TROPONINI, PROBNP in the last 168 hours.  PAST MEDICAL HISTORY :   She  has a past medical history of Coronary artery disease involving native coronary artery of native heart with angina pectoris ([place]) (01/2013), Daily headache, Depression, Dyslipidemia, goal LDL below 70, History of Doppler ultrasound, History of Takotsubo cardiomyopathy  (11/2018), Hypertension, Ischemic cardiomyopathy (06/2018), Mild dysplasia of cervix ([place]) (02/04/2019), STEMI involving left anterior descending coronary artery ([place]) (09/2017; 06/2018), STEMI involving oth coronary artery of inferior wall ([place]) (03/2013), Tobacco abuse (01/11/2016), and Type II diabetes mellitus ([place]) (12/2010).  PAST SURGICAL HISTORY:  She  has a past surgical history that includes Cholecystectomy (~ 2000); Tubal ligation (1994); left heart catheterization with coronary angiogram (N/A, 02/25/2013); left heart catheterization with coronary angiogram (N/A, 04/01/2013); percutaneous coronary stent intervention (pci-s) (04/01/2013); LEFT HEART CATH AND CORONARY ANGIOGRAPHY (N/A, 10/10/2017); CORONARY STENT INTERVENTION (N/A, 10/10/2017); NM MYOVIEW LTD (12/2015); Percutaneous coronary stent intervention (pci-s) (02/25/2013); LEFT HEART CATH AND CORONARY ANGIOGRAPHY (N/A, 07/03/2018); CORONARY STENT INTERVENTION (N/A, 07/03/2018); transthoracic echocardiogram (07/04/2018); transthoracic echocardiogram (02/14/2019); and transthoracic echocardiogram (12/18/2018).  No Known Allergies  No current facility-administered medications on file prior to encounter.   Current Outpatient Medications on File Prior to Encounter  Medication Sig  . atorvastatin (LIPITOR) 40 MG tablet Take 40 mg by mouth 2 (two) times daily.   . carvedilol (COREG) 6.25 MG tablet Take 1 tablet (6.25 mg total) by mouth 2 (two) times daily.  . furosemide (LASIX) 40 MG tablet Take 1 tablet (40 mg total) by mouth daily. (Patient taking differently: Take 20 mg by mouth daily. )  . glipiZIDE (GLUCOTROL) 10 MG tablet Take 1 tablet (10 mg total) by mouth 2 (two) times daily before a meal.  . Insulin Glargine (LANTUS SOLOSTAR) 100 UNIT/ML Solostar Pen Inject 30 Units into the skin daily.  . sacubitril-valsartan (ENTRESTO) 24-26 MG Take 1 tablet by mouth 2 (two) times daily.  Marland Kitchen spironolactone (ALDACTONE) 25 MG tablet Take 0.5 tablets (12.5  mg total) by mouth daily.  . ticagrelor (BRILINTA) 60 MG TABS tablet Take 1 tablet (60 mg total) by mouth 2 (two) times daily.  Marland Kitchen atorvastatin (LIPITOR) 80 MG tablet Take 1 tablet (80 mg total) by mouth daily. (Patient not taking: Reported on 10/31/2019)  . blood glucose meter kit and supplies Dispense based on patient and insurance preference. Use up to four times daily as directed. (FOR ICD-10 E10.9, E11.9).  Marland Kitchen Blood Glucose Monitoring Suppl (TRUE METRIX METER) DEVI  1 kit by Does not apply route 4 (four) times daily.  Marland Kitchen gabapentin (NEURONTIN) 300 MG capsule Take 1 capsule (300 mg total) by mouth at bedtime. (Patient not taking: Reported on 10/31/2019)  . glucose blood test strip Use as instructed  . nitroGLYCERIN (NITROSTAT) 0.4 MG SL tablet Place 1 tablet (0.4 mg total) under the tongue every 5 (five) minutes as needed for chest pain. Reported on 11/25/2015  . sacubitril-valsartan (ENTRESTO) 49-51 MG Take 1 tablet by mouth 2 (two) times daily. (Patient not taking: Reported on 10/31/2019)  . TRUEPLUS LANCETS 28G MISC 28 g by Does not apply route 4 (four) times daily.    FAMILY HISTORY:   Her family history includes Diabetes in her mother, sister, sister, son, and son.  SOCIAL HISTORY:  She  reports that she has been smoking cigarettes. She has been smoking about 0.00 packs per day for the past 22.00 years. She has never used smokeless tobacco. She reports current alcohol use. She reports current drug use. Drug: Marijuana.  REVIEW OF SYSTEMS:    Chest pain, dizziness, constipation   Welford Roche, MD  Internal Medicine PGY-3    Pulmonary critical care attending:  This is a 49 year old female with a significant past medical history of hypertension, type 2 diabetes, hyperlipidemia, HFpEF, history of STEMI as well as NSTEMI prior PCI with stent placement.  Patient admitted to the hospital for hypoxemia.  Ultimately found to be in a hypertensive emergency and acute pulmonary edema patient  was intubated on mechanical life support.  She was extubated on 11/02/2019.  Patient remains intensive care unit this morning.  Blood pressures also remain soft with ongoing diuresis.  She was giving some of her volume back last night with a 250 cc bolus and received another 500 cc bolus this morning.  BP 120/80   Pulse 76   Temp 97.9 F (36.6 C) (Oral)   Resp (!) 21   Ht 5' 5"  (1.651 m)   Wt 110.7 kg   LMP 10/20/2015   SpO2 100%   BMI 40.61 kg/m   General: Obese female comfortable in bed on nasal cannula HEENT: Tracking appropriately Heart: Regular rhythm S1-S2 Lungs: Bilateral breath sounds, no crackles no wheeze  Labs: Reviewed Slight rise in serum creatinine.  Chest x-ray: Reviewed from this morning no significant infiltrate no pulmonary edema.  Assessment: Hypertensive emergency Acute bilateral pulmonary edema, resolved Acute hypoxemic respiratory failure secondary to above was requiring mechanical ventilation now extubated doing well. New acute systolic heart failure with reduced ejection fraction CAD with a history of PCI Prolonged QT Hyperglycemia with type 2 diabetes  Plan: We appreciate cardiology input. Plans for left and right heart catheterization tomorrow. Continue on atorvastatin plus Brilinta EKG from this morning reviewed with progressive T wave inversion. Plans for ischemic evaluation. Continue Entresto, spironolactone and carvedilol Hold diuretics. Patient remains in the intensive care unit for close hemodynamic respiratory support.  We will institute vasopressors if needed to maintain mean arterial pressure.  It does appear that she has responded to repeat fluid challenge.  Suspect hypotension was related to overdiuresis.  This patient is critically ill with multiple organ system failure; which, requires frequent high complexity decision making, assessment, support, evaluation, and titration of therapies. This was completed through the application of  advanced monitoring technologies and extensive interpretation of multiple databases. During this encounter critical care time was devoted to patient care services described in this note for 32 minutes.  Garner Nash, DO [facility]  11/03/2019 3:51 PM

## 2019-11-03 NOTE — Progress Notes (Addendum)
CSW spoke with Jenny Reichmann in financial counseling to request this patient be screened for benefits, the patient was assigned to a counselor and they will be in contact with the patient.  Madilyn Fireman, MSW, LCSW-A Transitions of Care  Clinical Social Worker  [facility] Emergency Departments  Medical ICU [phone]

## 2019-11-03 NOTE — Progress Notes (Addendum)
This rn arrived to room for cardiac monitor alarming bradycardia 49, pt with c/o chest discomfort, feeling dizzy and lightheaded, sbp noted in 60s. Lowered pt hob, camera visual done by elink nurse Gretchen. Cvp reading 2, ns iv infusion started at 200 ml/hr and ekg done. Elzie Rings will notify md of event.

## 2019-11-03 NOTE — Progress Notes (Signed)
Nutrition Follow-up  DOCUMENTATION CODES:   Obesity unspecified  INTERVENTION:   DM, Heart healthy diet education provided.  No PO supplements indicated currently. Will continue to monitor for adequacy of intake and add supplements as medically appropriate.  NUTRITION DIAGNOSIS:   Inadequate oral intake related to inability to eat(pt sedated and ventilated) as evidenced by NPO status.  Resolving  GOAL:   Patient will meet greater than or equal to 90% of their needs  Progressing  MONITOR:   Vent status, Labs, Weight trends, Skin, I & O's  REASON FOR ASSESSMENT:   Consult Enteral/tube feeding initiation and management  ASSESSMENT:   49 y.o. female with a pertinent past medical HTN, type 2 diabetes, hyperlipidemia, HFpEF, STEMI, NSTEMI PCD, tobacco use disorder who presented to [facility] in acute hypoxic respiratory failure and signs and symptoms of hypervolemia including pulmonary edema  Patient was extubated 4/4. Diet has been advanced to heart healthy CHO modified. Patient reports good intake, eating almost all of her meals.   Labs and medications reviewed.  Received consult for diabetes/cardiac diet education. RD provided "Heart Healthy Consistent Carbohydrate" handout from the Academy of Nutrition and Dietetics. Discussed different food groups and their effects on blood sugar, emphasizing carbohydrate-containing foods. Provided list of carbohydrates and recommended serving sizes of common foods.  Discussed importance of controlled and consistent carbohydrate intake throughout the day. Provided examples of ways to balance meals/snacks and encouraged intake of high-fiber, whole grain complex carbohydrates.  Provided examples on ways to decrease sodium and fat intake in diet. Discouraged intake of processed foods and use of salt shaker. Encouraged fresh fruits and vegetables as well as whole grain sources of carbohydrates to maximize fiber intake.   Teach back method  used. Expect fair to good compliance.   NUTRITION - FOCUSED PHYSICAL EXAM:    Most Recent Value  Orbital Region  No depletion  Upper Arm Region  No depletion  Thoracic and Lumbar Region  No depletion  Buccal Region  No depletion  Temple Region  No depletion  Clavicle Bone Region  No depletion  Clavicle and Acromion Bone Region  No depletion  Scapular Bone Region  No depletion  Dorsal Hand  No depletion  Patellar Region  No depletion  Anterior Thigh Region  No depletion  Posterior Calf Region  No depletion  Edema (RD Assessment)  None  Hair  Reviewed  Eyes  Reviewed  Mouth  Reviewed  Skin  Reviewed  Nails  Reviewed       Diet Order:   Diet Order            Diet heart healthy/carb modified Room service appropriate? Yes with Assist; Fluid consistency: Thin  Diet effective now              EDUCATION NEEDS:   No education needs have been identified at this time  Skin:  Skin Assessment: Reviewed RN Assessment  Last BM:  4/5 type 6  Height:   Ht Readings from Last 1 Encounters:  10/31/19 5\' 5"  (1.651 m)    Weight:   Wt Readings from Last 1 Encounters:  10/31/19 110.7 kg    Ideal Body Weight:  56.8 kg  BMI:  Body mass index is 40.61 kg/m.  Estimated Nutritional Needs:   Kcal:  1750-2000  Protein:  90-110 gm  Fluid:  >1.7L/day    Molli Barrows, RD, LDN, CNSC Please refer to Amion for contact information.

## 2019-11-03 NOTE — Progress Notes (Signed)
Progress Note  Patient Name: Amy Chaney Date of Encounter: 11/03/2019  Primary Cardiologist: Glenetta Hew, MD   Subjective   Transient, sharp substernal chest pain this morning.  Breathing is markedly improved.  Inpatient Medications    Scheduled Meds: . carvedilol  6.25 mg Oral BID WC  . chlorhexidine gluconate (MEDLINE KIT)  15 mL Mouth Rinse BID  . Chlorhexidine Gluconate Cloth  6 each Topical Daily  . enoxaparin (LOVENOX) injection  0.5 mg/kg Subcutaneous Q24H  . insulin aspart  3-9 Units Subcutaneous Q4H  . insulin glargine  30 Units Subcutaneous Daily  . mouth rinse  15 mL Mouth Rinse q12n4p  . multivitamin with minerals  1 tablet Oral Daily  . pantoprazole  40 mg Oral Daily  . potassium chloride  20 mEq Oral Q4H  . sacubitril-valsartan  1 tablet Oral BID  . senna-docusate  2 tablet Oral BID  . spironolactone  12.5 mg Oral Daily  . ticagrelor  90 mg Oral BID   Continuous Infusions: . nitroGLYCERIN Stopped (11/02/19 0256)  . propofol (DIPRIVAN) infusion Stopped (11/02/19 1124)   PRN Meds: fentaNYL (SUBLIMAZE) injection, midazolam, sodium chloride flush   Vital Signs    Vitals:   11/03/19 0500 11/03/19 0700 11/03/19 0715 11/03/19 0800  BP: 118/72 (!) 86/47 (!) 90/46 101/60  Pulse: 77 (!) 58 79 (!) 57  Resp: _0 Temp:      TempSrc:      SpO2: 100% 100% 100% 100%  Weight:      Height:        Intake/Output Summary (Last 24 hours) at 11/03/2019 1015 Last data filed at 11/03/2019 0900 Gross per 24 hour  Intake 589.21 ml  Output 1565 ml  Net -975.79 ml   Last 3 Weights 10/31/2019 06/19/2019 02/26/2019  Weight (lbs) 244 lb 0.8 oz 244 lb 231 lb  Weight (kg) 110.7 kg 110.678 kg 104.781 kg      Telemetry    Sinus rhythm - Personally Reviewed  ECG    Sinus rhythm, ST/T wave abnormality consider anterior ischemia - Personally Reviewed  Physical Exam  Alert, oriented woman in no distress GEN: No acute distress.   Neck: No JVD Cardiac: RRR,  2/6 systolic murmur at the right upper sternal border Respiratory: Clear to auscultation bilaterally. GI: Soft, nontender, non-distended  MS: No edema; No deformity.  Right radial pulse 2+ Neuro:  Nonfocal  Psych: Normal affect   Labs    High Sensitivity Troponin:   Recent Labs  Lab 10/28/19 1540 10/31/19 0858 10/31/19 1157 11/03/19 0720  TROPONINIHS 17 8 18* 6      Chemistry Recent Labs  Lab 10/31/19 0858 10/31/19 0923 10/31/19 1630 11/01/19 0301 11/02/19 0459 11/03/19 0120 11/03/19 0720  NA 136   < > 141   < > 141 140 141  K 3.7   < > 3.5   < > 3.4* 3.5 3.9  CL 103   < > 105   < > 103 103 102  CO2 18*   < > 23   < > _1 GLUCOSE 504*   < > 182*   < > 289* 119* 121*  BUN 12   < > 13   < > 27* 27* 30*  CREATININE 1.04*   < > 0.89   < > 0.91 1.24* 0.95  CALCIUM 8.4*   < > 8.3*   < > 9.1 8.4* 8.8*  PROT 7.6  --  7.1  --   --  7.0  --   ALBUMIN 3.2*  --  3.0*  --   --  2.8*  --   AST 32  --  23  --   --  12*  --   ALT 17  --  19  --   --  12  --   ALKPHOS 45  --  43  --   --  33*  --   BILITOT 1.0  --  0.5  --   --  1.6*  --   GFRNONAA >60   < > >60   < > >60 51* >60  GFRAA >60   < > >60   < > >60 59* >60  ANIONGAP 15   < > 13   < > _0 < > = values in this interval not displayed.     Hematology Recent Labs  Lab 11/01/19 0301 11/01/19 0301 11/01/19 0337 11/02/19 0459 11/03/19 0720  WBC 15.9*  --   --  12.4* 13.9*  RBC 4.23  --   --  4.09 4.26  HGB 11.2*   < > 11.9* 10.9* 11.1*  HCT 35.4*   < > 35.0* 34.8* 36.6  MCV 83.7  --   --  85.1 85.9  MCH 26.5  --   --  26.7 26.1  MCHC 31.6  --   --  31.3 30.3  RDW 14.3  --   --  14.6 14.9  PLT 360  --   --  353 360   < > = values in this interval not displayed.    BNP Recent Labs  Lab 10/31/19 0858  BNP 1,339.2*     DDimer No results for input(s): DDIMER in the last 168 hours.   Radiology    DG CHEST PORT 1 VIEW  Result Date: 11/03/2019 CLINICAL DATA:  Chest pain EXAM: PORTABLE CHEST 1  VIEW COMPARISON:  November 02, 2019 FINDINGS: Central catheter tip is in the superior vena cava. Endotracheal tube and nasogastric tube have been removed. No pneumothorax. Lungs are clear. Heart is mildly enlarged with pulmonary vascularity normal. No adenopathy. No bone lesions. IMPRESSION: Central catheter tip in superior vena cava. No pneumothorax. Lungs clear. Stable cardiac prominence. Electronically Signed   By: Lowella Grip III M.D.   On: 11/03/2019 08:33   DG CHEST PORT 1 VIEW  Result Date: 11/02/2019 CLINICAL DATA:  Follow-up respiratory distress.  Intubated patient. EXAM: PORTABLE CHEST 1 VIEW COMPARISON:  10/31/2019 FINDINGS: There has been significant interval improvement. Airspace lung opacities have resolved. There is mild persistent vascular congestion/interstitial prominence, mostly at the bases. No convincing pleural effusion.  No pneumothorax. Endotracheal tube, nasal/orogastric tube and right internal jugular central venous line are stable and well positioned. IMPRESSION: 1. Significant interval improvement with resolution of the airspace lung opacities. Given the rapid improvement, this is most consistent with improved/resolved pulmonary edema. 2. Stable well-positioned support apparatus. Electronically Signed   By: Lajean Manes M.D.   On: 11/02/2019 05:30    Cardiac Studies   Cardiac Cath 07/03/2018: Conclusion    Previously placed Prox LAD to Mid LAD drug eluting stents are widely patent up until the distal edge..  Mid LAD lesion is 100% stenosed at the distal edge of the most distal stent.  A drug-eluting stent was successfully placed using a STENT SYNERGY DES 3X12.  Post intervention, there is a 0% residual stenosis.  Balloon angioplasty was performed in the apical vessel just to help move distal embolization -allowing  for flow down to the inferoapex.Colon Flattery 2nd Diag lesion is 100% stenosed. Known occluded stent from previous PCI  Prox RCA to Mid RCA stents are 5%  stenosed.  Mid RCA to Dist RCA lesion is 40% stenosed.  Ost 2nd Mrg to 2nd Mrg lesion is 65% stenosed.  There is moderate to severe left ventricular systolic dysfunction.  LV end diastolic pressure is normal.  The left ventricular ejection fraction is 25-35% by visual estimate.  There is trivial (1+) mitral regurgitation.  There is no aortic valve stenosis.  Post intervention, there is a 0% residual stenosis.  Mid LAD lesion is 100% stenosed.   SUMMARY:  Distal stent edge 100% thrombosis (in the mid LAD) as culprit for anterior STEMI: Treated with PTCA and overlapping DES stent (synergy 3.0 mm x 12 mm postdilated to 3.6 mm in the overlap and 3.3 mm distally.  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  (EF roughly 30 to 35%)  RECOMMENDATION:  Admit to CVICU for ongoing care.  Will run Aggrastat for 3 hours post PCI.  Long-term dual endplate therapy.  Currently aspirin and Brilinta, had been on Effient, but did not afford.  Would be okay to use Effient, Brilinta or Plavix as long she will be on it.  Uninterrupted for 1 year, would be okay to stop Plavix for 3 months if on Effient or Brilinta, at 6 months if on Plavix.  Restart home medications including carvedilol, lisinopril, atorvastatin  Sliding scale insulin for now.  Check A1c.  Check 2D echo to better assess EF.  Would not anticipate fast track discharge based on anterior MI with reduced EF.  Case management or social work consultation to help ensure that she is able to get her medications and not miss doses.  Needs lots of education.     Echo 10/31/2019: FINDINGS  Left Ventricle: Left ventricular ejection fraction, by estimation, is 20  to 25%. The left ventricle has severely decreased function. The left  ventricle demonstrates global hypokinesis. The left ventricular internal    cavity size was mildly dilated. There  is mild left ventricular hypertrophy. Left ventricular diastolic  parameters are consistent with Grade III diastolic dysfunction  (restrictive).   Right Ventricle: The right ventricular size is normal.Right ventricular  systolic function is normal. There is moderately elevated pulmonary artery  systolic pressure. The tricuspid regurgitant velocity is 3.20 m/s, and  with an assumed right atrial  pressure of 8 mmHg, the estimated right ventricular systolic pressure is  81.2 mmHg.   Left Atrium: Left atrial size was mildly dilated.   Right Atrium: Right atrial size was normal in size.   Pericardium: There is no evidence of pericardial effusion.   Mitral Valve: The mitral valve is normal in structure. Normal mobility of  the mitral valve leaflets. Mild mitral valve regurgitation. No evidence of  mitral valve stenosis.   Tricuspid Valve: The tricuspid valve is normal in structure. Tricuspid  valve regurgitation is mild . No evidence of tricuspid stenosis.   Aortic Valve: The aortic valve is tricuspid. Aortic valve regurgitation is  not visualized. No aortic stenosis is present.   Pulmonic Valve: The pulmonic valve was not well visualized. Pulmonic valve  regurgitation is trivial. No evidence of pulmonic stenosis.   Aorta: The aortic root is normal in size and structure.   Venous: The inferior vena cava is normal in size  with less than 50%  respiratory variability, suggesting right atrial pressure of 8 mmHg.   IAS/Shunts: No atrial level shunt detected by color flow Doppler.   Additional Comments: Severe global reduction in LV systolic function; mild  LVH; mild LVE; restrictive filling; mild MR and TR; mild LAE; moderate  pulmonary hypertension.   Patient Profile     49 y.o. female with known CAD, history of PCI in the LAD, diagonal, and RCA, history of anterior MI in 2019, presenting with acute on chronic combined systolic and diastolic  heart failure with respiratory failure requiring intubation  Assessment & Plan    1.  Acute on chronic combined systolic and diastolic heart failure: Restrictive filling pattern noted along with severe LV systolic dysfunction LVEF less than 30%.  Patient's blood pressure is now low and she is breathing very comfortably.  I think it is best to hold her diuretics today.  Otherwise continue current therapy which includes Entresto, spironolactone, and carvedilol. 2.  Coronary artery disease: High-sensitivity troponin is essentially unremarkable this admission.  However, with severe LV dysfunction and known multivessel CAD, I have recommended proceeding with right and left heart catheterization for further evaluation of worsening CAD as a cause of her heart failure.  This will also help assess her hemodynamics.  She is clinically improved today and I think she will be stable for cardiac catheterization tomorrow. I have reviewed the risks, indications, and alternatives to cardiac catheterization, possible angioplasty, and stenting with the patient. Risks include but are not limited to bleeding, infection, vascular injury, stroke, myocardial infection, arrhythmia, kidney injury, radiation-related injury in the case of prolonged fluoroscopy use, emergency cardiac surgery, and death. The patient understands the risks of serious complication is 1-2 in 7616 with diagnostic cardiac cath and 1-2% or less with angioplasty/stenting.   3. VDRF: clinically improved with diuresis. Appreciate CCM care. 4. CAD, multivessel: continue ticagrelor, cath tomorrow. 5. Type II DM: insulin requiring. Per primary team.  For questions or updates, please contact [facility] Please consult [email or web address] for contact info under     Signed, Sherren Mocha, MD  11/03/2019, 10:15 AM

## 2019-11-03 NOTE — H&P (View-Only) (Signed)
Progress Note  Patient Name: Amy Chaney Date of Encounter: 11/03/2019  Primary Cardiologist: Glenetta Hew, MD   Subjective   Transient, sharp substernal chest pain this morning.  Breathing is markedly improved.  Inpatient Medications    Scheduled Meds: . carvedilol  6.25 mg Oral BID WC  . chlorhexidine gluconate (MEDLINE KIT)  15 mL Mouth Rinse BID  . Chlorhexidine Gluconate Cloth  6 each Topical Daily  . enoxaparin (LOVENOX) injection  0.5 mg/kg Subcutaneous Q24H  . insulin aspart  3-9 Units Subcutaneous Q4H  . insulin glargine  30 Units Subcutaneous Daily  . mouth rinse  15 mL Mouth Rinse q12n4p  . multivitamin with minerals  1 tablet Oral Daily  . pantoprazole  40 mg Oral Daily  . potassium chloride  20 mEq Oral Q4H  . sacubitril-valsartan  1 tablet Oral BID  . senna-docusate  2 tablet Oral BID  . spironolactone  12.5 mg Oral Daily  . ticagrelor  90 mg Oral BID   Continuous Infusions: . nitroGLYCERIN Stopped (11/02/19 0256)  . propofol (DIPRIVAN) infusion Stopped (11/02/19 1124)   PRN Meds: fentaNYL (SUBLIMAZE) injection, midazolam, sodium chloride flush   Vital Signs    Vitals:   11/03/19 0500 11/03/19 0700 11/03/19 0715 11/03/19 0800  BP: 118/72 (!) 86/47 (!) 90/46 101/60  Pulse: 77 (!) 58 79 (!) 57  Resp: _0 Temp:      TempSrc:      SpO2: 100% 100% 100% 100%  Weight:      Height:        Intake/Output Summary (Last 24 hours) at 11/03/2019 1015 Last data filed at 11/03/2019 0900 Gross per 24 hour  Intake 589.21 ml  Output 1565 ml  Net -975.79 ml   Last 3 Weights 10/31/2019 06/19/2019 02/26/2019  Weight (lbs) 244 lb 0.8 oz 244 lb 231 lb  Weight (kg) 110.7 kg 110.678 kg 104.781 kg      Telemetry    Sinus rhythm - Personally Reviewed  ECG    Sinus rhythm, ST/T wave abnormality consider anterior ischemia - Personally Reviewed  Physical Exam  Alert, oriented woman in no distress GEN: No acute distress.   Neck: No JVD Cardiac: RRR,  2/6 systolic murmur at the right upper sternal border Respiratory: Clear to auscultation bilaterally. GI: Soft, nontender, non-distended  MS: No edema; No deformity.  Right radial pulse 2+ Neuro:  Nonfocal  Psych: Normal affect   Labs    High Sensitivity Troponin:   Recent Labs  Lab 10/28/19 1540 10/31/19 0858 10/31/19 1157 11/03/19 0720  TROPONINIHS 17 8 18* 6      Chemistry Recent Labs  Lab 10/31/19 0858 10/31/19 0923 10/31/19 1630 11/01/19 0301 11/02/19 0459 11/03/19 0120 11/03/19 0720  NA 136   < > 141   < > 141 140 141  K 3.7   < > 3.5   < > 3.4* 3.5 3.9  CL 103   < > 105   < > 103 103 102  CO2 18*   < > 23   < > _1 GLUCOSE 504*   < > 182*   < > 289* 119* 121*  BUN 12   < > 13   < > 27* 27* 30*  CREATININE 1.04*   < > 0.89   < > 0.91 1.24* 0.95  CALCIUM 8.4*   < > 8.3*   < > 9.1 8.4* 8.8*  PROT 7.6  --  7.1  --   --  7.0  --   ALBUMIN 3.2*  --  3.0*  --   --  2.8*  --   AST 32  --  23  --   --  12*  --   ALT 17  --  19  --   --  12  --   ALKPHOS 45  --  43  --   --  33*  --   BILITOT 1.0  --  0.5  --   --  1.6*  --   GFRNONAA >60   < > >60   < > >60 51* >60  GFRAA >60   < > >60   < > >60 59* >60  ANIONGAP 15   < > 13   < > _0 < > = values in this interval not displayed.     Hematology Recent Labs  Lab 11/01/19 0301 11/01/19 0301 11/01/19 0337 11/02/19 0459 11/03/19 0720  WBC 15.9*  --   --  12.4* 13.9*  RBC 4.23  --   --  4.09 4.26  HGB 11.2*   < > 11.9* 10.9* 11.1*  HCT 35.4*   < > 35.0* 34.8* 36.6  MCV 83.7  --   --  85.1 85.9  MCH 26.5  --   --  26.7 26.1  MCHC 31.6  --   --  31.3 30.3  RDW 14.3  --   --  14.6 14.9  PLT 360  --   --  353 360   < > = values in this interval not displayed.    BNP Recent Labs  Lab 10/31/19 0858  BNP 1,339.2*     DDimer No results for input(s): DDIMER in the last 168 hours.   Radiology    DG CHEST PORT 1 VIEW  Result Date: 11/03/2019 CLINICAL DATA:  Chest pain EXAM: PORTABLE CHEST 1  VIEW COMPARISON:  November 02, 2019 FINDINGS: Central catheter tip is in the superior vena cava. Endotracheal tube and nasogastric tube have been removed. No pneumothorax. Lungs are clear. Heart is mildly enlarged with pulmonary vascularity normal. No adenopathy. No bone lesions. IMPRESSION: Central catheter tip in superior vena cava. No pneumothorax. Lungs clear. Stable cardiac prominence. Electronically Signed   By: Lowella Grip III M.D.   On: 11/03/2019 08:33   DG CHEST PORT 1 VIEW  Result Date: 11/02/2019 CLINICAL DATA:  Follow-up respiratory distress.  Intubated patient. EXAM: PORTABLE CHEST 1 VIEW COMPARISON:  10/31/2019 FINDINGS: There has been significant interval improvement. Airspace lung opacities have resolved. There is mild persistent vascular congestion/interstitial prominence, mostly at the bases. No convincing pleural effusion.  No pneumothorax. Endotracheal tube, nasal/orogastric tube and right internal jugular central venous line are stable and well positioned. IMPRESSION: 1. Significant interval improvement with resolution of the airspace lung opacities. Given the rapid improvement, this is most consistent with improved/resolved pulmonary edema. 2. Stable well-positioned support apparatus. Electronically Signed   By: Lajean Manes M.D.   On: 11/02/2019 05:30    Cardiac Studies   Cardiac Cath 07/03/2018: Conclusion    Previously placed Prox LAD to Mid LAD drug eluting stents are widely patent up until the distal edge..  Mid LAD lesion is 100% stenosed at the distal edge of the most distal stent.  A drug-eluting stent was successfully placed using a STENT SYNERGY DES 3X12.  Post intervention, there is a 0% residual stenosis.  Balloon angioplasty was performed in the apical vessel just to help move distal embolization -allowing  for flow down to the inferoapex.Colon Flattery 2nd Diag lesion is 100% stenosed. Known occluded stent from previous PCI  Prox RCA to Mid RCA stents are 5%  stenosed.  Mid RCA to Dist RCA lesion is 40% stenosed.  Ost 2nd Mrg to 2nd Mrg lesion is 65% stenosed.  There is moderate to severe left ventricular systolic dysfunction.  LV end diastolic pressure is normal.  The left ventricular ejection fraction is 25-35% by visual estimate.  There is trivial (1+) mitral regurgitation.  There is no aortic valve stenosis.  Post intervention, there is a 0% residual stenosis.  Mid LAD lesion is 100% stenosed.   SUMMARY:  Distal stent edge 100% thrombosis (in the mid LAD) as culprit for anterior STEMI: Treated with PTCA and overlapping DES stent (synergy 3.0 mm x 12 mm postdilated to 3.6 mm in the overlap and 3.3 mm distally.  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  (EF roughly 30 to 35%)  RECOMMENDATION:  Admit to CVICU for ongoing care.  Will run Aggrastat for 3 hours post PCI.  Long-term dual endplate therapy.  Currently aspirin and Brilinta, had been on Effient, but did not afford.  Would be okay to use Effient, Brilinta or Plavix as long she will be on it.  Uninterrupted for 1 year, would be okay to stop Plavix for 3 months if on Effient or Brilinta, at 6 months if on Plavix.  Restart home medications including carvedilol, lisinopril, atorvastatin  Sliding scale insulin for now.  Check A1c.  Check 2D echo to better assess EF.  Would not anticipate fast track discharge based on anterior MI with reduced EF.  Case management or social work consultation to help ensure that she is able to get her medications and not miss doses.  Needs lots of education.     Echo 10/31/2019: FINDINGS  Left Ventricle: Left ventricular ejection fraction, by estimation, is 20  to 25%. The left ventricle has severely decreased function. The left  ventricle demonstrates global hypokinesis. The left ventricular internal    cavity size was mildly dilated. There  is mild left ventricular hypertrophy. Left ventricular diastolic  parameters are consistent with Grade III diastolic dysfunction  (restrictive).   Right Ventricle: The right ventricular size is normal.Right ventricular  systolic function is normal. There is moderately elevated pulmonary artery  systolic pressure. The tricuspid regurgitant velocity is 3.20 m/s, and  with an assumed right atrial  pressure of 8 mmHg, the estimated right ventricular systolic pressure is  81.2 mmHg.   Left Atrium: Left atrial size was mildly dilated.   Right Atrium: Right atrial size was normal in size.   Pericardium: There is no evidence of pericardial effusion.   Mitral Valve: The mitral valve is normal in structure. Normal mobility of  the mitral valve leaflets. Mild mitral valve regurgitation. No evidence of  mitral valve stenosis.   Tricuspid Valve: The tricuspid valve is normal in structure. Tricuspid  valve regurgitation is mild . No evidence of tricuspid stenosis.   Aortic Valve: The aortic valve is tricuspid. Aortic valve regurgitation is  not visualized. No aortic stenosis is present.   Pulmonic Valve: The pulmonic valve was not well visualized. Pulmonic valve  regurgitation is trivial. No evidence of pulmonic stenosis.   Aorta: The aortic root is normal in size and structure.   Venous: The inferior vena cava is normal in size  with less than 50%  respiratory variability, suggesting right atrial pressure of 8 mmHg.   IAS/Shunts: No atrial level shunt detected by color flow Doppler.   Additional Comments: Severe global reduction in LV systolic function; mild  LVH; mild LVE; restrictive filling; mild MR and TR; mild LAE; moderate  pulmonary hypertension.   Patient Profile     49 y.o. female with known CAD, history of PCI in the LAD, diagonal, and RCA, history of anterior MI in 2019, presenting with acute on chronic combined systolic and diastolic  heart failure with respiratory failure requiring intubation  Assessment & Plan    1.  Acute on chronic combined systolic and diastolic heart failure: Restrictive filling pattern noted along with severe LV systolic dysfunction LVEF less than 30%.  Patient's blood pressure is now low and she is breathing very comfortably.  I think it is best to hold her diuretics today.  Otherwise continue current therapy which includes Entresto, spironolactone, and carvedilol. 2.  Coronary artery disease: High-sensitivity troponin is essentially unremarkable this admission.  However, with severe LV dysfunction and known multivessel CAD, I have recommended proceeding with right and left heart catheterization for further evaluation of worsening CAD as a cause of her heart failure.  This will also help assess her hemodynamics.  She is clinically improved today and I think she will be stable for cardiac catheterization tomorrow. I have reviewed the risks, indications, and alternatives to cardiac catheterization, possible angioplasty, and stenting with the patient. Risks include but are not limited to bleeding, infection, vascular injury, stroke, myocardial infection, arrhythmia, kidney injury, radiation-related injury in the case of prolonged fluoroscopy use, emergency cardiac surgery, and death. The patient understands the risks of serious complication is 1-2 in 7616 with diagnostic cardiac cath and 1-2% or less with angioplasty/stenting.   3. VDRF: clinically improved with diuresis. Appreciate CCM care. 4. CAD, multivessel: continue ticagrelor, cath tomorrow. 5. Type II DM: insulin requiring. Per primary team.  For questions or updates, please contact [facility] Please consult [email or web address] for contact info under     Signed, Sherren Mocha, MD  11/03/2019, 10:15 AM

## 2019-11-03 NOTE — Evaluation (Signed)
Clinical/Bedside Swallow Evaluation Patient Details  Name: BRITTAINY MERLE MRN: [id number] Date of Birth: [birth year]  Today's Date: 11/03/2019 Time: SLP Start Time (ACUTE ONLY): 1154 SLP Stop Time (ACUTE ONLY): 1203 SLP Time Calculation (min) (ACUTE ONLY): 9 min  Past Medical History:  Past Medical History:  Diagnosis Date  . Coronary artery disease involving native coronary artery of native heart with angina pectoris ([place]) 01/2013   a. s/p PCI of the mid RCA 01/2013 //  b. LHC 9/14: EF 55%, RCA stent 100% occluded, LAD irregularities >>  subacute stent thrombosis, Promus DES PCI distal overlap // c) 09/2017 - Ant STEMI - LAD-D2 PCI - Successful PCI of LAD (3 overlapping DES), unable to restore flow down D2l that was stented as well.  Likely related to downstream dissection, but unable to rewire.  . Daily headache   . Depression   . Dyslipidemia, goal LDL below 70   . History of Doppler ultrasound    carotid bruit >> a. Carotid US 6/17: bilat ICA 1-39%  . History of Takotsubo cardiomyopathy 11/2018   Admitted for acute on chronic combined systolic and diastolic heart failure.  EF down to 20-25% global hypokinesis.  Was in the setting of grieving over the loss of her son from MI.  ->  EF improved to 45-50% on follow-up echocardiogram.  . Hypertension   . Ischemic cardiomyopathy 06/2018   s/p inferior STEMI followed by 2 anterior STEMIs: Following recovery from Takotsubo Cardiomyopathy- Echo 02/14/19: Moderate LVH.  EF improved up to 45-50%.  GR 1 DD.  Severe apical akinesis.  Normal valves.  . Mild dysplasia of cervix (CIN I) 02/04/2019   Seen after colposcopy on 01/23/19 done for ASCU +HPV pap  > repeat pap and HPV test in 12 and 24 months  . STEMI involving left anterior descending coronary artery ([place]) 09/2017; 06/2018   a) Severe [place] 1, 1, 1 LAD-D2 lesion (complicated by post PTCA dissection/intramural hematoma) -successful extensive PCI of the LAD but unable to maintain patency of the  stented D2. b) distal mLAD stent Edge 100% thrombosis --> PTCA & Overlapping DES PCI (Synergy 3 x 12 --> 3.6-3.3 mm). D2 occluded. RCA stents patent, 65 % OM2. EF 30-35%.  Marland Kitchen STEMI involving oth coronary artery of inferior wall ([place]) 03/2013   Secondary to subacute stent thrombosis of RCA stent segment having missed 4 doses of Effient.  . Tobacco abuse 01/11/2016  . Type II diabetes mellitus ([place]) 12/2010   sister and son also diabetic    Past Surgical History:  Past Surgical History:  Procedure Laterality Date  . CHOLECYSTECTOMY  ~ 2000  . CORONARY STENT INTERVENTION N/A 10/10/2017   Procedure: CORONARY STENT INTERVENTION;  Surgeon: Leonie Man, MD;  Location: [place] [facility];  Service: Cardiovascular; LAD PCI: STENT SYNERGY DES 3.5X32 (p),STENT SYNERGY DES 3.5X 8 (m), STENT SYNERGY DES 3.5X28 (d).  D2 PCI: STENT SYNERGY DES 2.5X24.  (UNABL TO REWIRE & EXPAND - TO OF DIAG AT END OF CASE)  . CORONARY STENT INTERVENTION N/A 07/03/2018   Procedure: CORONARY STENT INTERVENTION;  Surgeon: Leonie Man, MD;  Location: [facility];; PTCA & overlapping DES PCI (overlaps prior stents distally) - Synergy DES 3 x 12 (3.6 mm @ overlap -- 3.3 mm distally)>  . LEFT HEART CATH AND CORONARY ANGIOGRAPHY N/A 10/10/2017   Procedure: LEFT HEART CATH AND CORONARY ANGIOGRAPHY;  Surgeon: Leonie Man, MD;  Location: [place] [facility];  Service: Cardiovascular;  Laterality: N/A; -  patent RCA stents with mild in-stent restenosis. [place] 1,1,1 mLD-D2 [id number] (complicated by dissection/intramural thrombus)  . LEFT HEART CATH AND CORONARY ANGIOGRAPHY N/A 07/03/2018   Procedure: LEFT HEART CATH AND CORONARY ANGIOGRAPHY;  Surgeon: Leonie Man, MD;  Location: [place] [facility];; 100% thrombotic occlusion distal LAD stent -- DES PCI. continued 100% D2 (previously lost). patent RCA stents, 65% OM2.  EF 30-35%.  Marland Kitchen LEFT HEART CATHETERIZATION WITH CORONARY ANGIOGRAM N/A 02/25/2013   Procedure: LEFT HEART  CATHETERIZATION WITH CORONARY ANGIOGRAM;  Surgeon: Laverda Page, MD;  Location: [facility] CATH LAB;  Service: Cardiovascular;; CTO m RCA; otherwise normal coronaries  . LEFT HEART CATHETERIZATION WITH CORONARY ANGIOGRAM N/A 04/01/2013   Procedure: LEFT HEART CATHETERIZATION WITH CORONARY ANGIOGRAM;  Surgeon: Laverda Page, MD;  Location: [facility] CATH LAB;  Service: Cardiovascular: 100% occlusion of distal RCA stent (subacute stent thrombosis -  secondary to stopping Effient).  Overlapping DES PCI  . NM MYOVIEW LTD  12/2015    EF 38%.  Hypertensive response to exercise (231/132 mmHg).  INTERMEDIATE RISK due to -diffuse hypokinesis and reduced EF.  No ischemia or infarction.  Marland Kitchen PERCUTANEOUS CORONARY STENT INTERVENTION (PCI-S)  04/01/2013   Procedure: PERCUTANEOUS CORONARY STENT INTERVENTION (PCI-S);  Surgeon: Laverda Page, MD;  [facility] CATH LAB;  Service: Cardiovascular;; PCI of distal RCA stent subacute thrombosis: Promus Premier DES 3.0 mm x 20 mm.  Marland Kitchen PERCUTANEOUS CORONARY STENT INTERVENTION (PCI-S)  02/25/2013   Procedure: PERCUTANEOUS CORONARY STENT INTERVENTION (PCI-S);  Surgeon: Laverda Page, MD;  Location: [facility] CATH LAB;  RCA CTO PCI with overlapping Promus DES: 3.0 mm x 38 mm, 3.5 mm x 12mm  . TRANSTHORACIC ECHOCARDIOGRAM  07/04/2018   recurrent Anterior STEMI: EF 35-40%.  Apical hypokinesis.  GRII DD.  No thrombus noted.  (Improved EF from 30% up to 35-40% from previous echo)  . TRANSTHORACIC ECHOCARDIOGRAM  02/14/2019   Echo 02/14/19: Moderate LVH.  EF improved up to 45-50%.  GR 1 DD.  Severe apical akinesis.  Normal valves.  . TRANSTHORACIC ECHOCARDIOGRAM  12/18/2018   TAKOTUSBO CM: EF 20-25%-diffuse HK..  Moderately increased LV thickness.  GRII DD with elevated LVEDP.  Severe LA dilation.  Suspected Takotsubo's cardiomyopathy.  . TUBAL LIGATION  1994   HPI:  49 y.o. female with a pertinent past medical HTN, type 2 diabetes, hyperlipidemia, HFpEF, STEMI, NSTEMI PCD, tobacco use  disorder who presented to [facility] in acute hypoxic respiratory failure and signs and symptoms of hypervolemia including pulmonary edema. Became unresponsive once at hospital, intubated 4/2-4/4, . CXR was significant for bilateral PE, blood gas positive for hypercarbic respiratory acidosis.   Assessment / Plan / Recommendation Clinical Impression  Pt alert, in chair with vocal quality and intentional cough adequate. She does complain of mild mandibular pain following 2 day intubation. Pt consumed 3 oz during [place] swallow screen consecutively, timely and without s/s aspiration. Solid held in oral cavity to intentional moisten prior to transit and pt stating it hurt her jaw to masticate. Recommend she continue regular texture and she can choose softer items on menu she feel she can masticate easily. Pills with meds and straws allowed. No further ST needed.    SLP Visit Diagnosis: Dysphagia, unspecified (R13.10)    Aspiration Risk  Mild aspiration risk    Diet Recommendation Regular;Thin liquid   Liquid Administration via: Cup;Straw Medication Administration: Whole meds with liquid Postural Changes: Seated upright at 90 degrees    Other  Recommendations Oral Care Recommendations: Oral  care BID   Follow up Recommendations None      Frequency and Duration            Prognosis        Swallow Study   General Date of Onset: 10/31/19 HPI: 49 y.o. female with a pertinent past medical HTN, type 2 diabetes, hyperlipidemia, HFpEF, STEMI, NSTEMI PCD, tobacco use disorder who presented to [facility] in acute hypoxic respiratory failure and signs and symptoms of hypervolemia including pulmonary edema. Became unresponsive once at hospital, intubated 4/2-4/4, . CXR was significant for bilateral PE, blood gas positive for hypercarbic respiratory acidosis. Type of Study: Bedside Swallow Evaluation Previous Swallow Assessment: none Diet Prior to this Study: Regular;Thin liquids Temperature Spikes  Noted: No Respiratory Status: Room air History of Recent Intubation: Yes Length of Intubations (days): 2 days Date extubated: 11/02/19 Behavior/Cognition: Alert;Cooperative;Pleasant mood Oral Cavity Assessment: Other (comment)(c/o mandibular pain) Oral Care Completed by SLP: Yes Oral Cavity - Dentition: Adequate natural dentition Vision: Functional for self-feeding Self-Feeding Abilities: Able to feed self Patient Positioning: Upright in chair Baseline Vocal Quality: Normal Volitional Cough: Strong Volitional Swallow: Able to elicit    Oral/Motor/Sensory Function Overall Oral Motor/Sensory Function: Within functional limits(limited mandibular ROM due to pain)   Ice Chips Ice chips: Not tested   Thin Liquid Thin Liquid: Within functional limits Presentation: Cup;Straw    Nectar Thick Nectar Thick Liquid: Not tested   Honey Thick Honey Thick Liquid: Not tested   Puree Puree: Not tested   Solid     Solid: Within functional limits      Houston Siren 11/03/2019,12:20 PM  Orbie Pyo Colvin Caroli.Ed Risk analyst [phone] Office [phone]

## 2019-11-03 NOTE — Progress Notes (Signed)
Physical Therapy Treatment Patient Details Name: Amy Chaney MRN: [id number] DOB: 1971-06-21 Today's Date: 11/03/2019    History of Present Illness Pt adm hypertensive emergency with acute systolic and diastolic CHF and hypoxemic respiratory failure. Pt intubated 4/2-4/4. PMH - CAD, heart failure, HTN, STEMI, DM    PT Comments    Pt presents to PT with slightly unsteady gait due to illness and inactivity. Expect pt will make good progress back to baseline with mobility. Will follow acutely but doubt pt will need PT after DC.    Follow Up Recommendations  No PT follow up;Supervision - Intermittent     Equipment Recommendations  Other (comment)(To be determined)    Recommendations for Other Services       Precautions / Restrictions Precautions Precautions: Fall    Mobility  Bed Mobility Overal bed mobility: Needs Assistance Bed Mobility: Sidelying to Sit   Sidelying to sit: Min assist       General bed mobility comments: Assist to elevate trunk into sitting  Transfers Overall transfer level: Needs assistance Equipment used: 4-wheeled walker Transfers: Sit to/from Stand Sit to Stand: Min assist;+2 safety/equipment         General transfer comment: Assist for stability and to bring hips up  Ambulation/Gait Ambulation/Gait assistance: Min guard;+2 safety/equipment Gait Distance (Feet): 170 Feet Assistive device: 4-wheeled walker Gait Pattern/deviations: Step-through pattern;Decreased stride length Gait velocity: decr Gait velocity interpretation: 1.31 - 2.62 ft/sec, indicative of limited community ambulator General Gait Details: Assist for safety and lines.    Stairs             Wheelchair Mobility    Modified Rankin (Stroke Patients Only)       Balance Overall balance assessment: Mild deficits observed, not formally tested                                          Cognition Arousal/Alertness: Awake/alert Behavior During  Therapy: WFL for tasks assessed/performed Overall Cognitive Status: Within Functional Limits for tasks assessed                                        Exercises      General Comments General comments (skin integrity, edema, etc.): Pt on 2L O2. Decr O2 to zero and spO2 >96% after amb      Pertinent Vitals/Pain Pain Assessment: No/denies pain Faces Pain Scale: Hurts little more Pain Location: mandible Pain Descriptors / Indicators: Constant;Discomfort Pain Intervention(s): Monitored during session    Home Living Family/patient expects to be discharged to:: Private residence Living Arrangements: Other relatives(sister) Available Help at Discharge: Family;Available 24 hours/day Type of Home: Apartment Home Access: Stairs to enter Entrance Stairs-Rails: Right Home Layout: One level Home Equipment: Shower seat;Hand held shower head;Grab bars - tub/shower      Prior Function Level of Independence: Independent      Comments: works   PT Goals (current goals can now be found in the care plan section) Acute Rehab PT Goals Patient Stated Goal: return home PT Goal Formulation: With patient Time For Goal Achievement: 11/10/19 Potential to Achieve Goals: Good    Frequency    Min 3X/week      PT Plan      Co-evaluation PT/OT/SLP Co-Evaluation/Treatment: Yes Reason for Co-Treatment: For patient/therapist safety PT goals addressed during  session: Mobility/safety with mobility        AM-PAC PT "6 Clicks" Mobility   Outcome Measure  Help needed turning from your back to your side while in a flat bed without using bedrails?: None Help needed moving from lying on your back to sitting on the side of a flat bed without using bedrails?: A Little Help needed moving to and from a bed to a chair (including a wheelchair)?: A Little Help needed standing up from a chair using your arms (e.g., wheelchair or bedside chair)?: A Little Help needed to walk in hospital  room?: A Little Help needed climbing 3-5 steps with a railing? : A Little 6 Click Score: 19    End of Session Equipment Utilized During Treatment: Gait belt;Oxygen Activity Tolerance: Patient tolerated treatment well Patient left: in chair;with call bell/phone within reach Nurse Communication: Mobility status PT Visit Diagnosis: Other abnormalities of gait and mobility (R26.89);Muscle weakness (generalized) (M62.81)     Time: [id number] PT Time Calculation (min) (ACUTE ONLY): 35 min  Charges:                        [place] Pager [phone] Office [place] 11/03/2019, 1:24 PM

## 2019-11-03 NOTE — Progress Notes (Signed)
eLink Physician-Brief Progress Note Patient Name: Amy Chaney DOB: 01/02/1971 MRN: [id number]   Date of Service  11/03/2019  HPI/Events of Note  Transient bradycardia and hypotension. Also with sensation of dizziness. Diuresed 6 liters in the past 2 days  eICU Interventions  CVP 1-2 Ordered a 250 cc NS bolus Repeat electrolytes tonight. Inform eLink once resulted     Intervention Category Major Interventions: Arrhythmia - evaluation and management;Hypotension - evaluation and management  Judd Lien 11/03/2019, 1:36 AM

## 2019-11-04 ENCOUNTER — Encounter (HOSPITAL_COMMUNITY)
Admission: EM | Disposition: A | Discharge status: Home / Self Care | Payer: Self-pay | Source: Home / Self Care | Attending: Emergency Medicine

## 2019-11-04 DIAGNOSIS — I5043 Acute on chronic combined systolic (congestive) and diastolic (congestive) heart failure: Secondary | ICD-10-CM

## 2019-11-04 DIAGNOSIS — I2510 Atherosclerotic heart disease of native coronary artery without angina pectoris: Secondary | ICD-10-CM

## 2019-11-04 HISTORY — PX: RIGHT/LEFT HEART CATH AND CORONARY ANGIOGRAPHY: CATH118266

## 2019-11-04 LAB — CBC
HCT: 33 % — ABNORMAL LOW (ref 36.0–46.0)
Hemoglobin: 10.2 g/dL — ABNORMAL LOW (ref 12.0–15.0)
MCH: 26.4 pg (ref 26.0–34.0)
MCHC: 30.9 g/dL (ref 30.0–36.0)
MCV: 85.5 fL (ref 80.0–100.0)
Platelets: 350 10*3/uL (ref 150–400)
RBC: 3.86 MIL/uL — ABNORMAL LOW (ref 3.87–5.11)
RDW: 14.6 % (ref 11.5–15.5)
WBC: 12.3 10*3/uL — ABNORMAL HIGH (ref 4.0–10.5)
nRBC: 0 % (ref 0.0–0.2)

## 2019-11-04 LAB — BASIC METABOLIC PANEL
Anion gap: 10 (ref 5–15)
BUN: 28 mg/dL — ABNORMAL HIGH (ref 6–20)
CO2: 26 mmol/L (ref 22–32)
Calcium: 8.6 mg/dL — ABNORMAL LOW (ref 8.9–10.3)
Chloride: 103 mmol/L (ref 98–111)
Creatinine, Ser: 0.76 mg/dL (ref 0.44–1.00)
GFR calc Af Amer: >60 mL/min (ref >60)
GFR calc non Af Amer: >60 mL/min (ref >60)
Glucose, Bld: 100 mg/dL — ABNORMAL HIGH (ref 70–99)
Potassium: 3.4 mmol/L — ABNORMAL LOW (ref 3.5–5.1)
Sodium: 139 mmol/L (ref 135–145)

## 2019-11-04 LAB — POCT I-STAT EG7
Acid-Base Excess: 3 mmol/L — ABNORMAL HIGH (ref 0.0–2.0)
Acid-Base Excess: 3 mmol/L — ABNORMAL HIGH (ref 0.0–2.0)
Acid-Base Excess: 2 mmol/L (ref 0.0–2.0)
Bicarbonate: 26.6 mmol/L (ref 20.0–28.0)
Bicarbonate: 27 mmol/L (ref 20.0–28.0)
Bicarbonate: 26.5 mmol/L (ref 20.0–28.0)
Calcium, Ion: 1.18 mmol/L (ref 1.15–1.40)
Calcium, Ion: 1.18 mmol/L (ref 1.15–1.40)
Calcium, Ion: 1.18 mmol/L (ref 1.15–1.40)
HCT: 33 % — ABNORMAL LOW (ref 36.0–46.0)
HCT: 33 % — ABNORMAL LOW (ref 36.0–46.0)
HCT: 33 % — ABNORMAL LOW (ref 36.0–46.0)
Hemoglobin: 11.2 g/dL — ABNORMAL LOW (ref 12.0–15.0)
Hemoglobin: 11.2 g/dL — ABNORMAL LOW (ref 12.0–15.0)
Hemoglobin: 11.2 g/dL — ABNORMAL LOW (ref 12.0–15.0)
O2 Saturation: 69 %
O2 Saturation: 73 %
O2 Saturation: 74 %
Potassium: 4 mmol/L (ref 3.5–5.1)
Potassium: 4 mmol/L (ref 3.5–5.1)
Potassium: 3.8 mmol/L (ref 3.5–5.1)
Sodium: 139 mmol/L (ref 135–145)
Sodium: 140 mmol/L (ref 135–145)
Sodium: 139 mmol/L (ref 135–145)
TCO2: 28 mmol/L (ref 22–32)
TCO2: 28 mmol/L (ref 22–32)
TCO2: 28 mmol/L (ref 22–32)
pCO2, Ven: 37.6 mmHg — ABNORMAL LOW (ref 44.0–60.0)
pCO2, Ven: 38 mmHg — ABNORMAL LOW (ref 44.0–60.0)
pCO2, Ven: 39.6 mmHg — ABNORMAL LOW (ref 44.0–60.0)
pH, Ven: 7.458 — ABNORMAL HIGH (ref 7.250–7.430)
pH, Ven: 7.459 — ABNORMAL HIGH (ref 7.250–7.430)
pH, Ven: 7.434 — ABNORMAL HIGH (ref 7.250–7.430)
pO2, Ven: 34 mmHg (ref 32.0–45.0)
pO2, Ven: 36 mmHg (ref 32.0–45.0)
pO2, Ven: 38 mmHg (ref 32.0–45.0)

## 2019-11-04 LAB — POCT I-STAT 7, (LYTES, BLD GAS, ICA,H+H)
Acid-Base Excess: 2 mmol/L (ref 0.0–2.0)
Bicarbonate: 25.8 mmol/L (ref 20.0–28.0)
Calcium, Ion: 1.15 mmol/L (ref 1.15–1.40)
HCT: 34 % — ABNORMAL LOW (ref 36.0–46.0)
Hemoglobin: 11.6 g/dL — ABNORMAL LOW (ref 12.0–15.0)
O2 Saturation: 98 %
Potassium: 3.9 mmol/L (ref 3.5–5.1)
Sodium: 139 mmol/L (ref 135–145)
TCO2: 27 mmol/L (ref 22–32)
pCO2 arterial: 34.9 mmHg (ref 32.0–48.0)
pH, Arterial: 7.477 — ABNORMAL HIGH (ref 7.350–7.450)
pO2, Arterial: 101 mmHg (ref 83.0–108.0)

## 2019-11-04 LAB — FOLATE RBC
Folate, Hemolysate: 268 ng/mL
Folate, RBC: 724 ng/mL (ref >498)
Hematocrit: 37 % (ref 34.0–46.6)

## 2019-11-04 LAB — GLUCOSE, CAPILLARY
Glucose-Capillary: 108 mg/dL — ABNORMAL HIGH (ref 70–99)
Glucose-Capillary: 124 mg/dL — ABNORMAL HIGH (ref 70–99)
Glucose-Capillary: 151 mg/dL — ABNORMAL HIGH (ref 70–99)

## 2019-11-04 SURGERY — RIGHT/LEFT HEART CATH AND CORONARY ANGIOGRAPHY
Anesthesia: LOCAL

## 2019-11-04 MED ORDER — IOHEXOL 350 MG/ML SOLN
INTRAVENOUS | Status: DC | PRN
  Administered 2019-11-04: 80 mL via INTRACARDIAC

## 2019-11-04 MED ORDER — LIDOCAINE HCL (PF) 1 % IJ SOLN
INTRAMUSCULAR | Status: DC | PRN
  Administered 2019-11-04 (×2): 2 mL

## 2019-11-04 MED ORDER — SODIUM CHLORIDE 0.9 % IV SOLN: 250.0000 mL | INTRAVENOUS | Status: DC | PRN

## 2019-11-04 MED ORDER — LIDOCAINE HCL (PF) 1 % IJ SOLN
INTRAMUSCULAR | Status: AC
  Filled 2019-11-04: qty 30

## 2019-11-04 MED ORDER — SODIUM CHLORIDE 0.9 % IV SOLN: INTRAVENOUS | Status: DC

## 2019-11-04 MED ORDER — FUROSEMIDE 40 MG PO TABS
40.0000 mg | ORAL_TABLET | Freq: Every day | ORAL | Status: DC
  Administered 2019-11-04: 40 mg via ORAL
  Filled 2019-11-04: qty 1

## 2019-11-04 MED ORDER — POTASSIUM CHLORIDE CRYS ER 20 MEQ PO TBCR
40.0000 meq | EXTENDED_RELEASE_TABLET | Freq: Once | ORAL | Status: AC
  Administered 2019-11-04: 40 meq via ORAL
  Filled 2019-11-04: qty 2

## 2019-11-04 MED ORDER — ACETAMINOPHEN 325 MG PO TABS
650.0000 mg | ORAL_TABLET | ORAL | Status: DC | PRN
  Administered 2019-11-04 (×2): 650 mg via ORAL
  Filled 2019-11-04 (×2): qty 2

## 2019-11-04 MED ORDER — SODIUM CHLORIDE 0.9% FLUSH: 3.0000 mL | INTRAVENOUS | Status: DC | PRN

## 2019-11-04 MED ORDER — MIDAZOLAM HCL 2 MG/2ML IJ SOLN
INTRAMUSCULAR | Status: DC | PRN
  Administered 2019-11-04: 2 mg via INTRAVENOUS

## 2019-11-04 MED ORDER — ASPIRIN 81 MG PO CHEW: 81.0000 mg | CHEWABLE_TABLET | ORAL | Status: DC

## 2019-11-04 MED ORDER — SODIUM CHLORIDE 0.9 % IV SOLN: INTRAVENOUS | Status: AC

## 2019-11-04 MED ORDER — HEPARIN SODIUM (PORCINE) 1000 UNIT/ML IJ SOLN
INTRAMUSCULAR | Status: DC | PRN
  Administered 2019-11-04: 5500 [IU] via INTRAVENOUS

## 2019-11-04 MED ORDER — ENTRESTO 24-26 MG PO TABS: 1.0000 | ORAL_TABLET | Freq: Two times a day (BID) | ORAL | 2 refills | Status: DC

## 2019-11-04 MED ORDER — HEPARIN (PORCINE) IN NACL 1000-0.9 UT/500ML-% IV SOLN
INTRAVENOUS | Status: DC | PRN
  Administered 2019-11-04 (×2): 500 mL

## 2019-11-04 MED ORDER — VERAPAMIL HCL 2.5 MG/ML IV SOLN
INTRAVENOUS | Status: AC
  Filled 2019-11-04: qty 2

## 2019-11-04 MED ORDER — MIDAZOLAM HCL 2 MG/2ML IJ SOLN
INTRAMUSCULAR | Status: AC
  Filled 2019-11-04: qty 2

## 2019-11-04 MED ORDER — ASPIRIN 81 MG PO CHEW
81.0000 mg | CHEWABLE_TABLET | ORAL | Status: AC
  Administered 2019-11-04: 81 mg via ORAL
  Filled 2019-11-04: qty 1

## 2019-11-04 MED ORDER — SODIUM CHLORIDE 0.9% FLUSH
3.0000 mL | Freq: Two times a day (BID) | INTRAVENOUS | Status: DC
  Administered 2019-11-04: 10 mL via INTRAVENOUS

## 2019-11-04 MED ORDER — ENOXAPARIN SODIUM 60 MG/0.6ML ~~LOC~~ SOLN: 50.0000 mg | SUBCUTANEOUS | Status: DC

## 2019-11-04 MED ORDER — FENTANYL CITRATE (PF) 100 MCG/2ML IJ SOLN
INTRAMUSCULAR | Status: DC | PRN
  Administered 2019-11-04: 25 ug via INTRAVENOUS

## 2019-11-04 MED ORDER — HYDRALAZINE HCL 20 MG/ML IJ SOLN
INTRAMUSCULAR | Status: AC
  Filled 2019-11-04: qty 1

## 2019-11-04 MED ORDER — FENTANYL CITRATE (PF) 100 MCG/2ML IJ SOLN
INTRAMUSCULAR | Status: AC
  Filled 2019-11-04: qty 2

## 2019-11-04 MED ORDER — ENTRESTO 24-26 MG PO TABS: 1.0000 | ORAL_TABLET | Freq: Two times a day (BID) | ORAL | 0 refills | Status: DC

## 2019-11-04 MED ORDER — HEPARIN SODIUM (PORCINE) 1000 UNIT/ML IJ SOLN
INTRAMUSCULAR | Status: AC
  Filled 2019-11-04: qty 1

## 2019-11-04 MED ORDER — HYDRALAZINE HCL 20 MG/ML IJ SOLN
INTRAMUSCULAR | Status: DC | PRN
  Administered 2019-11-04: 10 mg via INTRAVENOUS

## 2019-11-04 MED ORDER — VERAPAMIL HCL 2.5 MG/ML IV SOLN
INTRAVENOUS | Status: DC | PRN
  Administered 2019-11-04: 10 mL via INTRA_ARTERIAL

## 2019-11-04 MED ORDER — ENOXAPARIN SODIUM 40 MG/0.4ML ~~LOC~~ SOLN: 40.0000 mg | SUBCUTANEOUS | Status: DC

## 2019-11-04 MED ORDER — HEPARIN (PORCINE) IN NACL 1000-0.9 UT/500ML-% IV SOLN
INTRAVENOUS | Status: AC
  Filled 2019-11-04: qty 1000

## 2019-11-04 MED ORDER — CARVEDILOL 3.125 MG PO TABS: 3.1250 mg | ORAL_TABLET | Freq: Two times a day (BID) | ORAL | 0 refills | Status: DC

## 2019-11-04 MED ORDER — LABETALOL HCL 5 MG/ML IV SOLN: 10.0000 mg | INTRAVENOUS | Status: AC | PRN

## 2019-11-04 MED FILL — ?CARVEDILOL 3.125 MG TABLET: 3.125 | 30 days supply | Qty: 60 | Fill #0

## 2019-11-04 SURGICAL SUPPLY — 13 items
CATH BALLN WEDGE 5F 110CM (CATHETERS) ×1 IMPLANT
CATH OPTITORQUE TIG 4.0 5F (CATHETERS) ×1 IMPLANT
DEVICE RAD COMP TR BAND LRG (VASCULAR PRODUCTS) ×1 IMPLANT
GLIDESHEATH SLEND SS 6F .021 (SHEATH) ×1 IMPLANT
GUIDEWIRE .025 260CM (WIRE) ×1 IMPLANT
GUIDEWIRE INQWIRE 1.5J.035X260 (WIRE) IMPLANT
INQWIRE 1.5J .035X260CM (WIRE) ×2
KIT HEART LEFT (KITS) ×2 IMPLANT
PACK CARDIAC CATHETERIZATION (CUSTOM PROCEDURE TRAY) ×2 IMPLANT
SHEATH GLIDE SLENDER 4/5FR (SHEATH) ×1 IMPLANT
SHEATH PROBE COVER 6X72 (BAG) ×1 IMPLANT
TRANSDUCER W/STOPCOCK (MISCELLANEOUS) ×2 IMPLANT
TUBING CIL FLEX 10 FLL-RA (TUBING) ×2 IMPLANT

## 2019-11-04 NOTE — Interval H&P Note (Signed)
History and Physical Interval Note:  11/04/2019 7:42 AM  Amy Chaney  has presented today for surgery, with the diagnosis of Acute on Chronic Combined Systolic & Diastolic Heart Failure.  The various methods of treatment have been discussed with the patient and family. After consideration of risks, benefits and other options for treatment, the patient has consented to  Procedure(s): RIGHT/LEFT HEART CATH AND CORONARY ANGIOGRAPHY (N/A)  PERCUTANEOUS CORONARY INTERVENTION  as a surgical intervention.  The patient's history has been reviewed, patient examined, no change in status, stable for surgery.  I have reviewed the patient's chart and labs.  Questions were answered to the patient's satisfaction.   Cath Lab Visit (complete for each Cath Lab visit)  Clinical Evaluation Leading to the Procedure:   ACS: No.  Non-ACS:    Anginal Classification: CCS III -Class III CHF Sx.  Anti-ischemic medical therapy: Maximal Therapy (2 or more classes of medications)  Non-Invasive Test Results: Equivocal test results Minimal Troponin Levels but significantly reduced LVEF on Echo  Prior CABG: No previous CABG   Glenetta Hew

## 2019-11-04 NOTE — Progress Notes (Signed)
Progress Note  Patient Name: Amy Chaney Date of Encounter: 11/04/2019  Primary Cardiologist: Glenetta Hew, MD   Subjective   Doing well.  No chest pain or shortness of breath.  Inpatient Medications    Scheduled Meds: . Chlorhexidine Gluconate Cloth  6 each Topical Daily  . [START ON 11/05/2019] enoxaparin (LOVENOX) injection  50 mg Subcutaneous Q24H  . furosemide  40 mg Oral Daily  . insulin aspart  3-9 Units Subcutaneous Q4H  . insulin glargine  30 Units Subcutaneous Daily  . multivitamin with minerals  1 tablet Oral Daily  . [START ON 11/05/2019] pneumococcal 23 valent vaccine  0.5 mL Intramuscular Tomorrow-1000  . polyethylene glycol  17 g Oral BID  . sacubitril-valsartan  1 tablet Oral BID  . senna-docusate  2 tablet Oral BID  . sodium chloride flush  3 mL Intravenous Q12H  . sodium chloride flush  3 mL Intravenous Q12H  . spironolactone  12.5 mg Oral Daily  . ticagrelor  90 mg Oral BID   Continuous Infusions: . sodium chloride 75 mL/hr at 11/04/19 1400  . sodium chloride     PRN Meds: sodium chloride, acetaminophen, labetalol, sodium chloride flush   Vital Signs    Vitals:   11/04/19 1000 11/04/19 1100 11/04/19 1200 11/04/19 1318  BP: 125/81 112/60 (!) 125/55 (!) 158/71  Pulse: 62 68 (!) 54   Resp: 12 16 (!) 22   Temp:  98 F (36.7 C)  98.2 F (36.8 C)  TempSrc:  Oral  Oral  SpO2: 100% 100% 100%   Weight:      Height:        Intake/Output Summary (Last 24 hours) at 11/04/2019 1451 Last data filed at 11/04/2019 1400 Gross per 24 hour  Intake 461.3 ml  Output 350 ml  Net 111.3 ml   Last 3 Weights 11/04/2019 10/31/2019 06/19/2019  Weight (lbs) 241 lb 2.9 oz 244 lb 0.8 oz 244 lb  Weight (kg) 109.4 kg 110.7 kg 110.678 kg      Telemetry    Sinus rhythm - Personally Reviewed   Physical Exam  Alert, oriented woman in no distress GEN: No acute distress.   Neck: No JVD Cardiac: RRR, no murmurs, rubs, or gallops.  Respiratory: Clear to auscultation  bilaterally. GI: Soft, nontender, non-distended  MS: No edema; No deformity.  Right radial cath site is clear. Neuro:  Nonfocal  Psych: Normal affect   Labs    High Sensitivity Troponin:   Recent Labs  Lab 10/28/19 1540 10/31/19 0858 10/31/19 1157 11/03/19 0720  TROPONINIHS 17 8 18* 6      Chemistry Recent Labs  Lab 10/31/19 0858 10/31/19 0923 10/31/19 1630 11/01/19 0301 11/03/19 0120 11/03/19 0720 11/04/19 0459  NA 136   < > 141   < > 140 141 139  K 3.7   < > 3.5   < > 3.5 3.9 3.4*  CL 103   < > 105   < > 103 102 103  CO2 18*   < > 23   < > 26 27 26   GLUCOSE 504*   < > 182*   < > 119* 121* 100*  BUN 12   < > 13   < > 27* 30* 28*  CREATININE 1.04*   < > 0.89   < > 1.24* 0.95 0.76  CALCIUM 8.4*   < > 8.3*   < > 8.4* 8.8* 8.6*  PROT 7.6  --  7.1  --  7.0  --   --  ALBUMIN 3.2*  --  3.0*  --  2.8*  --   --   AST 32  --  23  --  12*  --   --   ALT 17  --  19  --  12  --   --   ALKPHOS 45  --  43  --  33*  --   --   BILITOT 1.0  --  0.5  --  1.6*  --   --   GFRNONAA >60   < > >60   < > 51* >60 >60  GFRAA >60   < > >60   < > 59* >60 >60  ANIONGAP 15   < > 13   < > 11 12 10    < > = values in this interval not displayed.     Hematology Recent Labs  Lab 11/02/19 0459 11/03/19 0720 11/04/19 0459  WBC 12.4* 13.9* 12.3*  RBC 4.09 4.26 3.86*  HGB 10.9* 11.1* 10.2*  HCT 34.8* 36.6 33.0*  MCV 85.1 85.9 85.5  MCH 26.7 26.1 26.4  MCHC 31.3 30.3 30.9  RDW 14.6 14.9 14.6  PLT 353 360 350    BNP Recent Labs  Lab 10/31/19 0858  BNP 1,339.2*     DDimer No results for input(s): DDIMER in the last 168 hours.   Radiology    CARDIAC CATHETERIZATION  Result Date: [id number]  LV end diastolic pressure is mildly elevated with only mildly elevated PCWP.  There is no aortic valve stenosis.  Ost 2nd Diag lesion is 100% stenosed.  Previously placed Prox LAD to Mid LAD stent is 10% stenosed & Mid-distal LAD drug eluting stents are widely patent.  Mid LAD to Dist LAD  lesion is 30% stenosed in a tapered fashion distal to the stents.Colon Flattery 2nd Mrg to 2nd Mrg lesion is 55% stenosed.  Prox RCA to Mid RCA stent is 5% stenosed.  Mid RCA to Dist RCA lesion is 40% stenosed.  SUMMARY:  Stable 3 V CAD- widely patent stents in LAD & RCA with known CTO of Diag branch & moderate OM disease.  No culprit lesion to explain reduced LVEF  Relatively normal RHC #s wth only mildly elevated LVEDP and PCWP despite systemic hypertension with blood pressures in the [id number] systolic. RECOMMENDATIONS:  Seems relatively well diuresed, probably still has small amount of volume left to removed.  More important is adequate hypertension management.  Suspect reduced EF is due to hypertensive urgency. Glenetta Hew, MD  DG CHEST PORT 1 VIEW  Result Date: 11/03/2019 CLINICAL DATA:  Chest pain EXAM: PORTABLE CHEST 1 VIEW COMPARISON:  November 02, 2019 FINDINGS: Central catheter tip is in the superior vena cava. Endotracheal tube and nasogastric tube have been removed. No pneumothorax. Lungs are clear. Heart is mildly enlarged with pulmonary vascularity normal. No adenopathy. No bone lesions. IMPRESSION: Central catheter tip in superior vena cava. No pneumothorax. Lungs clear. Stable cardiac prominence. Electronically Signed   By: Lowella Grip III M.D.   On: 11/03/2019 08:33    Cardiac Studies   Cardiac catheterization study is reviewed  Patient Profile     49 y.o. female with known CAD, history of PCI in the LAD, diagonal, and RCA, history of anterior MI in 2019, presenting with acute on chronic combined systolic and diastolic heart failure with respiratory failure requiring intubation  Assessment & Plan    1.  Acute on chronic combined systolic and diastolic heart failure: Restrictive filling pattern noted on her echocardiogram.  Severe LV systolic dysfunction is present with LVEF less than 30%.  Cardiac catheterization today is reviewed and showed stability of her coronary artery disease  with stent patency of the previously implanted coronary stents.  She has known chronic occlusion of the diagonal branch of the LAD.  The patient will be continued on Entresto, spironolactone, and carvedilol.  Because of resting bradycardia, will reduce her carvedilol dose to 3.125 mg twice daily.  She is also on furosemide 40 mg daily.  We had a lengthy discussion about lifestyle modification.  She is eating a high sodium diet and we discussed moving away from fast foods, bacon, and other high sodium food.  She is motivated to change her diet.  We also discussed tobacco cessation, daily weights, and home blood pressure monitoring which will be important for her down the road.  She does report good compliance with her medications. 2.  Coronary artery disease, native vessel: Stable coronary anatomy by cardiac catheterization today.  Study is reviewed. 3.  Acute respiratory failure: Now resolved with patient comfortable breathing on room air.  She has done very well with diuresis.  Primary issue appears to be congestive heart failure. 4.  Type 2 diabetes: Insulin requiring.  Per primary team.  Disposition: From a cardiac perspective the patient appears stable for hospital discharge.  Will arrange outpatient follow-up with Dr. Ellyn Hack or his APP.  Thank you.  [facility] will sign off.   Medication Recommendations:  See above. Continue current Rx.  Other recommendations (labs, testing, etc):  none Follow up as an outpatient:  Dr Ellyn Hack - will arrange  For questions or updates, please contact [place] Please consult [email or web address] for contact info under        Signed, Sherren Mocha, MD  11/04/2019, 2:51 PM

## 2019-11-04 NOTE — Discharge Summary (Signed)
Name: Amy Chaney MRN: [id number] DOB: [birth year] 49 y.o. PCP: [name], MD  Date of Admission: 10/31/2019  8:36 AM Date of Discharge:  Attending Physician: Garner Nash, DO  Discharge Diagnosis: 1. HTN emergency  2. Acute hypoxic respiratory failure 3.Heart failure with reduced EF  4. CAD  5. Normocytic anemia  6. T2DM 7. QT prolongation    Discharge Medications: Allergies as of 11/04/2019   No Known Allergies      Medication List     TAKE these medications    atorvastatin 40 MG tablet Commonly known as: LIPITOR Take 40 mg by mouth 2 (two) times daily.   atorvastatin 80 MG tablet Commonly known as: LIPITOR Take 1 tablet (80 mg total) by mouth daily.   blood glucose meter kit and supplies Dispense based on patient and insurance preference. Use up to four times daily as directed. (FOR ICD-10 E10.9, E11.9).   carvedilol 3.125 MG tablet Commonly known as: COREG Take 1 tablet (3.125 mg total) by mouth 2 (two) times daily. What changed:  medication strength how much to take   Entresto 24-26 MG Generic drug: sacubitril-valsartan Take 1 tablet by mouth 2 (two) times daily. What changed: Another medication with the same name was removed. Continue taking this medication, and follow the directions you see here.   furosemide 40 MG tablet Commonly known as: LASIX Take 1 tablet (40 mg total) by mouth daily. What changed: how much to take   gabapentin 300 MG capsule Commonly known as: NEURONTIN Take 1 capsule (300 mg total) by mouth at bedtime.   glipiZIDE 10 MG tablet Commonly known as: GLUCOTROL Take 1 tablet (10 mg total) by mouth 2 (two) times daily before a meal.   glucose blood test strip Use as instructed   Lantus SoloStar 100 UNIT/ML Solostar Pen Generic drug: insulin glargine Inject 30 Units into the skin daily.   nitroGLYCERIN 0.4 MG SL tablet Commonly known as: NITROSTAT Place 1 tablet (0.4 mg total) under the tongue every 5 (five)  minutes as needed for chest pain. Reported on 11/25/2015   spironolactone 25 MG tablet Commonly known as: ALDACTONE Take 0.5 tablets (12.5 mg total) by mouth daily.   ticagrelor 60 MG Tabs tablet Commonly known as: BRILINTA Take 1 tablet (60 mg total) by mouth 2 (two) times daily.   True Metrix Meter Devi 1 kit by Does not apply route 4 (four) times daily.   TRUEplus Lancets 28G Misc 28 g by Does not apply route 4 (four) times daily.        Disposition and follow-up:   Ms.Amy Chaney was discharged from [facility] in Stable condition.  At the hospital follow up visit please address:  1.  Please assess for medication adherence as well as volume status. Adjust diuretic dose as necessary. Please ensure patient has follow up with cardiology.   2.  Labs / imaging needed at time of follow-up: None  3.  Pending labs/ test needing follow-up: CBC to check Hgb and BMP to check renal function   Follow-up Appointments: Follow-up Information     [name], MD Follow up.   Specialty: Family Medicine Why: Please call your general doctor as soon as possible to schedule a hospital follow up appointment within the next 1-2 weeks.  Contact information: [place] [address] [phone]         Lendon Colonel, NP Follow up on 11/25/2019.   Specialties: Nurse Practitioner, Radiology, Cardiology  Why: at 2:45pm for your follow up appt Contact information: [place] [address] [phone]            Hospital Course by problem list:  1. HTN emergency: Patient presented with BP > 200s/100s in the setting of non-adherence to medications and low sodium diet. She required a nitroglycerin drip for BP control. Home medications were restarted with optimal blood pressure control. Per cardiology recommendations, she was discharged on Coreg 3.125 mg QD, Entresto 24-26 mg BID, and spironolactone 12.5 mg QD.  Refills for all 3 medications were sent to her pharmacy.   2. Acute hypoxic respiratory failure: Patient developed flash pulmonary edema in the setting of HTN emergency and required intubation and transfer to the ICU. She responded well to IV diuresis and was successfully extubated on hospital day #2 without complications. She was discharged home on Lasix 40 mg QD. No refill provided as patient stated she had just picked up medication from her pharmacy.   3.Heart failure with reduced EF  4. CAD  Patient was found to have a newly reduced EF of 20% with global hypokinesis (echo results below) that was thought to be secondary to HTN emergency. R/LHC performed given her extensive cardiac history and showed stable CAD without new lesions (see results below). Cardiology recommended continuing medical management with medications listed above. She was counseled on importance of compliance. She will follow up with outpatient cardiology.   5. Normocytic anemia: Hgb 13 at baseline with decrease to 10.9 during admission. Ferritin, iron panel, B12, and folate was normal. This was thought to be secondary to blood loss from blood draws. She did not require blood transfusions.   6. T2DM: Patient was managed with her home Lantus and sliding scale insulin. She reported noncompliance with glipizide at home due to diarrhea. Will need counseling on compliance as an outpatient.   7. QT prolongation: New with QTC >500. Thought to be secondary to medication side effect. Recommend outpatient EKG to check for resolution.    Discharge Vitals:   BP (!) 158/71 (BP Location: Left Arm)   Pulse (!) 54   Temp 98.2 F (36.8 C) (Oral)   Resp (!) 22   Ht 5' 5" (1.651 m)   Wt 109.4 kg   LMP 10/20/2015   SpO2 100%   BMI 40.13 kg/m   Pertinent Labs, Studies, and Procedures:  CBC Latest Ref Rng & Units 11/04/2019 11/03/2019 11/03/2019  WBC 4.0 - 10.5 K/uL 12.3(H) 13.9(H) -  Hemoglobin 12.0 - 15.0 g/dL 10.2(L) 11.1(L) -    Hematocrit 36.0 - 46.0 % 33.0(L) 36.6 37.0  Platelets 150 - 400 K/uL 350 360 -   BMP Latest Ref Rng & Units 11/04/2019 11/03/2019 11/03/2019  Glucose 70 - 99 mg/dL 100(H) 121(H) 119(H)  BUN 6 - 20 mg/dL 28(H) 30(H) 27(H)  Creatinine 0.44 - 1.00 mg/dL 0.76 0.95 1.24(H)  BUN/Creat Ratio 9 - 23 - - -  Sodium 135 - 145 mmol/L 139 141 140  Potassium 3.5 - 5.1 mmol/L 3.4(L) 3.9 3.5  Chloride 98 - 111 mmol/L 103 102 103  CO2 22 - 32 mmol/L _0 Calcium 8.9 - 10.3 mg/dL 8.6(L) 8.8(L) 8.4(L)   Echocardiogram 10/31/19: IMPRESSIONS   1. Severe global reduction in LV systolic function; mild LVH; mild LVE;  restrictive filling; mild MR and TR; mild LAE; moderate pulmonary  hypertension.   2. Left ventricular ejection fraction, by estimation, is 20 to 25%. The  left ventricle has severely  decreased function. The left ventricle  demonstrates global hypokinesis. The left ventricular internal cavity size  was mildly dilated. There is mild left  ventricular hypertrophy. Left ventricular diastolic parameters are  consistent with Grade III diastolic dysfunction (restrictive).   3. Right ventricular systolic function is normal. The right ventricular  size is normal. There is moderately elevated pulmonary artery systolic  pressure.   4. Left atrial size was mildly dilated.   5. The mitral valve is normal in structure. Mild mitral valve  regurgitation. No evidence of mitral stenosis.   6. The aortic valve is tricuspid. Aortic valve regurgitation is not  visualized. No aortic stenosis is present.   7. The inferior vena cava is normal in size with <50% respiratory  variability, suggesting right atrial pressure of 8 mmHg.   R/LHC 11/03/19: SUMMARY: Stable 3 V CAD- widely patent stents in LAD & RCA with known CTO of Diag branch & moderate OM disease. No culprit lesion to explain reduced LVEF Relatively normal RHC #s wth only mildly elevated LVEDP and PCWP despite systemic hypertension with blood pressures in  the 992I systolic.  CXR 10/31/19: IMPRESSION: Tube positions as described without pneumothorax. Cardiomegaly. Perihilar airspace opacity likely represents alveolar edema bilaterally. A degree of superimposed pneumonia cannot be excluded. Etiology for the edema not certain, although a degree of congestive heart failure may well be present.  Head CT 10/31/19: IMPRESSION: 1. No acute intracranial abnormality. 2. 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  Discharge Instructions: Discharge Instructions     (HEART FAILURE PATIENTS) Call MD:  Anytime you have any of the following symptoms: 1) 3 pound weight gain in 24 hours or 5 pounds in 1 week 2) shortness of breath, with or without a dry hacking cough 3) swelling in the hands, feet or stomach 4) if you have to sleep on extra pillows at night in order to breathe.   Complete by: As directed    Call MD for:  difficulty breathing, headache or visual disturbances   Complete by: As directed    Call MD for:  extreme fatigue   Complete by: As directed    Diet - low sodium heart healthy   Complete by: As directed    Discharge instructions   Complete by: As directed    You were admitted to the hospital due to low oxygen in your lungs that was caused by very high blood pressure. We had to intubate you to help you breath and bring oxygen into your lungs. We also found your heart was weaker from the high blood pressure as well. It will be very very important to take you blood pressure medicines every day as prescribed to prevent this from happening in the future. Please follow up with your heart doctor (Dr. Ellyn Hack) and PCP soon after discharge. Cardiology office should give you a call to schedule an appointment. If they dont call, I included their number in the paperwork so you can call and request an appointment. Please call us if you have any questions or concerns.   Increase activity slowly   Complete by: As directed         Signed: Welford Roche, MD 11/04/2019, 4:32 PM

## 2019-11-04 NOTE — Progress Notes (Signed)
Patient was given discharge education, IVs were removed, and patient's belongings were by the bedside with patient. Patient was taken out to the [facility] parking by NT on a wheelchair.

## 2019-11-04 NOTE — Discharge Summary (Signed)
Physician Discharge Summary         Patient ID: Amy Chaney MRN: [id number] DOB/AGE: [birth year] 50 y.o.  Admit date: 10/31/2019 Discharge date: 11/04/2019  Discharge Diagnoses:      Discharge summary    Amy Chaney is a 49 y.o. female with a pertinent past medical HTN, type 2 diabetes, hyperlipidemia, HFpEF, STEMI, NSTEMI PCD, tobacco use disorder who presented to [facility] in acute hypoxic respiratory failure and signs and symptoms of hypervolemia including pulmonary edema  Past medical HTN, T2DM,  HLD, HFrEF, STEMI s/p multiple stents, NSTEMI PCD, tobacco use disorder who presented to [facility] unresponsive with hypoxia.  I spoke to the patient's sister, [name], who said that patient has been feeling poorly for the last couple days.  She has been short of breath, fatigued, with left-sided chest pain radiating to left arm.  Patient went to the ED on Tuesday for the symptoms but ended up going home due to long wait.  Her symptoms continued to progress until today when she called an ambulance to take her to the hospital.  In the ambulance the EMTs noted her to be alert with significant dyspnea and hypoxia with SPO2 is in the high 80s to low 90s.  On presentation at the hospital the patient was unresponsive with with normal pulses and SPO2 in the 90% and hypertensive at 190/83. CXR was significant for bilateral pulmonary edema. Blood gas showed significant hypercarbic respiratory acidosis and the patient was quickly intubated.  Previous admission similar to this on 12/18/2018 with flash pulmonary edema and hypertensive emergency.  She was admitted to the ICU with HTN urgency and acute pulmonary edema. She was initially intubated on mechanical support. She was extubated and take to the cath lab once stable.   She was restarted on her oral heart failure regimen and the patient was discharge home.   Discharge Plan by Active Problems    # Hypertensive Emergency in the setting of medication  non-adherence  - Soft BP yesterday in the setting of overdiuresis, home medications were held  - BP now improved and we will resume home Entresto and spironolactone  - Stop Coreg in the setting of bradycardia with HR 50-low 60s - Counseled patient on importance of adherence  # HFrEF with newly reduced EF # CAD s/p PCI  # Chest pain  - Cardiology following, appreciate recommendations  - Found to have newly reduced EF of 20% from 45% in 01/2019. S/p R/LHC today with no culprit lesion identified. Newly reduced EF likely secondary to HTN emergency. Will continue working on BP management  - Continue atorvastatin and Brillinta - Resume home BP meds as above  - Resume home PO Lasix 40 mg daily  - Patient was taken for LHC - please see separate report   # Normocytic anemia: Stable and likely 2/2 to blood draws  - Continue to monitor   # Hyperglycemia, Type 2 diabetes mellitus - CBG improved with increase in Lantus  - Continue Lantus 30 units QHS and resistant- SSI w/ CBG checks q4h  - Can add meal coverage if needed   # Prolonged QT:  - Avoided QT prolonging meds - Repleting K and Mag as needed   # Constipation: Had 2 BMs yesterday  - Continue Miralax BID and Senokot 2 tabs BID    Significant Hospital tests/ studies   Left Heart Cath:   LV end diastolic pressure is mildly elevated with only mildly elevated PCWP.  There is no aortic valve stenosis.  Ost 2nd Diag lesion is 100% stenosed.  Previously placed Prox LAD to Mid LAD stent is 10% stenosed & Mid-distal LAD drug eluting stents are widely patent.  Mid LAD to Dist LAD lesion is 30% stenosed in a tapered fashion distal to the stents.Colon Flattery 2nd Mrg to 2nd Mrg lesion is 55% stenosed.  Prox RCA to Mid RCA stent is 5% stenosed.  Mid RCA to Dist RCA lesion is 40% stenosed.   SUMMARY:  Stable 3 V CAD- widely patent stents in LAD & RCA with known CTO of Diag branch & moderate OM disease.  No culprit lesion to explain  reduced LVEF  Relatively normal RHC #s wth only mildly elevated LVEDP and PCWP despite systemic hypertension with blood pressures in the 867E systolic.  RECOMMENDATIONS:  Seems relatively well diuresed, probably still has small amount of volume left to removed.  More important is adequate hypertension management.  Suspect reduced EF is due to hypertensive urgency.   Procedures   LHC/RHC as above   Culture data/antimicrobials   None    Consults  Cardiology   Discharge Exam: BP (!) 158/71 (BP Location: Left Arm)   Pulse (!) 54   Temp 98.2 F (36.8 C) (Oral)   Resp (!) 22   Ht 5' 5"  (1.651 m)   Wt 109.4 kg   LMP 10/20/2015   SpO2 100%   BMI 40.13 kg/m   Please see examination from today progress note.   Labs at discharge   Lab Results  Component Value Date   CREATININE 0.76 11/04/2019   BUN 28 (H) 11/04/2019   NA 139 11/04/2019   K 3.4 (L) 11/04/2019   CL 103 11/04/2019   CO2 26 11/04/2019   Lab Results  Component Value Date   WBC 12.3 (H) 11/04/2019   HGB 10.2 (L) 11/04/2019   HCT 33.0 (L) 11/04/2019   MCV 85.5 11/04/2019   PLT 350 11/04/2019   Lab Results  Component Value Date   ALT 12 11/03/2019   AST 12 (L) 11/03/2019   ALKPHOS 33 (L) 11/03/2019   BILITOT 1.6 (H) 11/03/2019   Lab Results  Component Value Date   INR 1.04 10/09/2017   INR 1.01 04/01/2013    Current radiological studies    CARDIAC CATHETERIZATION  Result Date: 01/29/946  LV end diastolic pressure is mildly elevated with only mildly elevated PCWP.  There is no aortic valve stenosis.  Ost 2nd Diag lesion is 100% stenosed.  Previously placed Prox LAD to Mid LAD stent is 10% stenosed & Mid-distal LAD drug eluting stents are widely patent.  Mid LAD to Dist LAD lesion is 30% stenosed in a tapered fashion distal to the stents.Colon Flattery 2nd Mrg to 2nd Mrg lesion is 55% stenosed.  Prox RCA to Mid RCA stent is 5% stenosed.  Mid RCA to Dist RCA lesion is 40% stenosed.  SUMMARY:   Stable 3 V CAD- widely patent stents in LAD & RCA with known CTO of Diag branch & moderate OM disease.  No culprit lesion to explain reduced LVEF  Relatively normal RHC #s wth only mildly elevated LVEDP and PCWP despite systemic hypertension with blood pressures in the 096G systolic. RECOMMENDATIONS:  Seems relatively well diuresed, probably still has small amount of volume left to removed.  More important is adequate hypertension management.  Suspect reduced EF is due to hypertensive urgency. Glenetta Hew, MD  DG CHEST PORT 1 VIEW  Result Date: 11/03/2019 CLINICAL DATA:  Chest pain EXAM: PORTABLE  CHEST 1 VIEW COMPARISON:  November 02, 2019 FINDINGS: Central catheter tip is in the superior vena cava. Endotracheal tube and nasogastric tube have been removed. No pneumothorax. Lungs are clear. Heart is mildly enlarged with pulmonary vascularity normal. No adenopathy. No bone lesions. IMPRESSION: Central catheter tip in superior vena cava. No pneumothorax. Lungs clear. Stable cardiac prominence. Electronically Signed   By: Lowella Grip III M.D.   On: 11/03/2019 08:33    Disposition:    Discharge disposition: 01-Home or Self Care       Discharge Instructions    (HEART FAILURE PATIENTS) Call MD:  Anytime you have any of the following symptoms: 1) 3 pound weight gain in 24 hours or 5 pounds in 1 week 2) shortness of breath, with or without a dry hacking cough 3) swelling in the hands, feet or stomach 4) if you have to sleep on extra pillows at night in order to breathe.   Complete by: As directed    Call MD for:  difficulty breathing, headache or visual disturbances   Complete by: As directed    Call MD for:  extreme fatigue   Complete by: As directed    Diet - low sodium heart healthy   Complete by: As directed    Discharge instructions   Complete by: As directed    You were admitted to the hospital due to low oxygen in your lungs that was caused by very high blood pressure. We had to  intubate you to help you breath and bring oxygen into your lungs. We also found your heart was weaker from the high blood pressure as well. It will be very very important to take you blood pressure medicines every day as prescribed to prevent this from happening in the future. Please follow up with your heart doctor (Dr. Ellyn Hack) and PCP soon after discharge. Cardiology office should give you a call to schedule an appointment. If they dont call, I included their number in the paperwork so you can call and request an appointment. Please call us if you have any questions or concerns.   Increase activity slowly   Complete by: As directed       Allergies as of 11/04/2019   No Known Allergies     Medication List    TAKE these medications   atorvastatin 40 MG tablet Commonly known as: LIPITOR Take 40 mg by mouth 2 (two) times daily.   atorvastatin 80 MG tablet Commonly known as: LIPITOR Take 1 tablet (80 mg total) by mouth daily.   blood glucose meter kit and supplies Dispense based on patient and insurance preference. Use up to four times daily as directed. (FOR ICD-10 E10.9, E11.9).   carvedilol 3.125 MG tablet Commonly known as: COREG Take 1 tablet (3.125 mg total) by mouth 2 (two) times daily. What changed:   medication strength  how much to take   Entresto 24-26 MG Generic drug: sacubitril-valsartan Take 1 tablet by mouth 2 (two) times daily. What changed: Another medication with the same name was removed. Continue taking this medication, and follow the directions you see here.   furosemide 40 MG tablet Commonly known as: LASIX Take 1 tablet (40 mg total) by mouth daily. What changed: how much to take   gabapentin 300 MG capsule Commonly known as: NEURONTIN Take 1 capsule (300 mg total) by mouth at bedtime.   glipiZIDE 10 MG tablet Commonly known as: GLUCOTROL Take 1 tablet (10 mg total) by mouth 2 (two) times daily before  a meal.   glucose blood test strip Use as  instructed   Lantus SoloStar 100 UNIT/ML Solostar Pen Generic drug: insulin glargine Inject 30 Units into the skin daily.   nitroGLYCERIN 0.4 MG SL tablet Commonly known as: NITROSTAT Place 1 tablet (0.4 mg total) under the tongue every 5 (five) minutes as needed for chest pain. Reported on 11/25/2015   spironolactone 25 MG tablet Commonly known as: ALDACTONE Take 0.5 tablets (12.5 mg total) by mouth daily.   ticagrelor 60 MG Tabs tablet Commonly known as: BRILINTA Take 1 tablet (60 mg total) by mouth 2 (two) times daily.   True Metrix Meter Devi 1 kit by Does not apply route 4 (four) times daily.   TRUEplus Lancets 28G Misc 28 g by Does not apply route 4 (four) times daily.        Follow-up appointment   Cardiology    Discharge Condition:    stable  Garner Nash, DO [facility] 11/04/2019 6:50 PM

## 2019-11-04 NOTE — Progress Notes (Addendum)
PULMONARY / CRITICAL CARE MEDICINE   NAME:  Amy Chaney, MRN:  [id number], DOB:  [birth year], LOS: 4 ADMISSION DATE:  10/31/2019, CONSULTATION DATE: 10/31/2019 REFERRING MD:  Charlesetta Shanks, CHIEF COMPLAINT: Hypoxia  BRIEF HISTORY:    Amy Chaney is a 49 y.o. female with a pertinent past medical HTN, type 2 diabetes, hyperlipidemia, HFpEF, STEMI, NSTEMI PCD, tobacco use disorder who presented to [facility] in acute hypoxic respiratory failure and signs and symptoms of hypervolemia including pulmonary edema  HISTORY OF PRESENT ILLNESS   Amy Chaney is a 49 y.o. female with a pertinent past medical HTN, T2DM,  HLD, HFrEF, STEMI s/p multiple stents, NSTEMI PCD, tobacco use disorder who presented to [facility] unresponsive with hypoxia.  I spoke to the patient's sister, Amy Chaney, who said that patient has been feeling poorly for the last couple days.  She has been short of breath, fatigued, with left-sided chest pain radiating to left arm.  Patient went to the ED on Tuesday for the symptoms but ended up going home due to long wait.  Her symptoms continued to progress until today when she called an ambulance to take her to the hospital.  In the ambulance the EMTs noted her to be alert with significant dyspnea and hypoxia with SPO2 is in the high 80s to low 90s.  On presentation at the hospital the patient was unresponsive with with normal pulses and SPO2 in the 90% and hypertensive at 190/83. CXR was significant for bilateral pulmonary edema. Blood gas showed significant hypercarbic respiratory acidosis and the patient was quickly intubated.  Previous admission similar to this on 12/18/2018 with flash pulmonary edema and hypertensive emergency.  SIGNIFICANT PAST MEDICAL HISTORY   Essential hypertension Type 2 diabetes mellitus Hyperlipidemia HFrEF (01/2019 - EF of 45-50%) STEMI - LAD/diagonal (09/2017), RCA (03/2013) NSTEMI CAD s/p stents in LAD x3 (09/2017 & 06/2018), diagonal (09/2017), and RCA  (01/2013) Tobacco use disorder (22+ pack year history)  SIGNIFICANT EVENTS:  04/02 RSI and CVC placed >  4/2 Intubated 4/4 Extubated    STUDIES:   Chest XR: Tube positions as described without pneumothorax. Cardiomegaly. Perihilar airspace opacity likely represents alveolar edema bilaterally. A degree of superimposed pneumonia cannot be excluded.Etiology for the edema not certain, although a degree of congestive heart failure may well be present.  EKG: Sinus tachycardia  Echocardiogram: Global reduction in LV systolic function with an EF of 20-25% LV mildly dilated Grade 3 diastolic dysfunction Moderately elevated pulmonary arterial pressures  Left atrium mildly enlarged IVC normal in size with less than 50% respiratory variation  CULTURES:  4/2 blood cultures> no growth to date   ANTIBIOTICS:  4/2-4/4 ceftriaxone and azithromycin   LINES/TUBES:  4/2 CVC> 4/2 ET tube> 4/4  CONSULTANTS:  4/2 cardiology consulted in the ED  SUBJECTIVE:  No acute events overnight. Complaining of slight HA this AM but otherwise doing well. Denies chest pain and shortness of breath. Had 2 BMs yesterday.   CONSTITUTIONAL: BP (!) 116/55   Pulse (!) 59   Temp 98.1 F (36.7 C) (Oral)   Resp 20   Ht 5' 5"  (1.651 m)   Wt 109.4 kg   LMP 10/20/2015   SpO2 99%   BMI 40.13 kg/m   I/O last 3 completed shifts: In: 3244 [P.O.:480; I.V.:165.4; Other:70; NG/GT:300; IV Piggyback:547.5] Out: 0102 [Urine:3255]       PHYSICAL EXAM: General: well-appearing female in NAD  Neuro: A&O x3, no motor or sensory deficits noted  Cardiovascular: RRR, nl S1/S2, II/VI  systolic murmur  Lungs: mild bibasilar crackles, otherwise clear  Abdomen:  Obese, soft, NTND, normoactive bowel sounds  Ext: warm and well perfused without LE pitting edema    RESOLVED PROBLEM LIST  Acute Hypoxic Respiratory Failure: secondary to flash pulmonary edema   ASSESSMENT AND PLAN    # Hypertensive Emergency in the  setting of medication non-adherence  - Soft BP yesterday in the setting of overdiuresis, home medications were held  - BP now improved and we will resume home Entresto and spironolactone  - Stop Coreg in the setting of bradycardia with HR 50-low 60s - Counseled patient on importance of adherence - Stable to transfer off the telemetry   # HFrEF with newly reduced EF # CAD s/p PCI  # Chest pain  - Cardiology following, appreciate recommendations  - Found to have newly reduced EF of 20% from 45% in 01/2019. S/p R/LHC today with no culprit lesion identified. Newly reduced EF likely secondary to HTN emergency. Will continue working on BP management  - Continue atorvastatin and Brillinta - Resume home BP meds as above  - Resume home PO Lasix 40 mg daily   # Normocytic anemia: Stable and likely 2/2 to blood draws  - Continue to monitor   # Hyperglycemia, Type 2 diabetes mellitus - CBG improved with increase in Lantus  - Continue Lantus 30 units QHS and resistant- SSI w/ CBG checks q4h  - Can add meal coverage if needed   # Prolonged QT:  - Avoid QT prolonging meds - Repleting K and Mag as needed   # Constipation: Had 2 BMs yesterday  - Continue Miralax BID and Senokot 2 tabs BID    Best Practice / Goals of Care / Disposition.   DVT PROPHYLAXIS: Enoxaparin 40 mg NUTRITION: HH/CM diet  MOBILITY: PT GOALS OF CARE: Full code FAMILY DISCUSSIONS: patient updated  DISPOSITION Telemetry   LABS  Glucose Recent Labs  Lab 11/03/19 0815 11/03/19 1156 11/03/19 1650 11/03/19 2013 11/03/19 2306 11/04/19 0307  GLUCAP 118* 249* 170* 229* 227* 108*    BMET Recent Labs  Lab 11/03/19 0120 11/03/19 0720 11/04/19 0459  NA 140 141 139  K 3.5 3.9 3.4*  CL 103 102 103  CO2 26 27 26   BUN 27* 30* 28*  CREATININE 1.24* 0.95 0.76  GLUCOSE 119* 121* 100*    Liver Enzymes Recent Labs  Lab 10/31/19 0858 10/31/19 1630 11/03/19 0120  AST 32 23 12*  ALT 17 19 12   ALKPHOS 45 43 33*   BILITOT 1.0 0.5 1.6*  ALBUMIN 3.2* 3.0* 2.8*    Electrolytes Recent Labs  Lab 11/01/19 0301 11/01/19 2114 11/02/19 0459 11/03/19 0120 11/03/19 0720 11/04/19 0459  CALCIUM 8.4*  --    < > 8.4* 8.8* 8.6*  MG 2.4 1.9  --  2.0  --   --   PHOS 2.7 3.3  --  4.0  --   --    < > = values in this interval not displayed.    CBC Recent Labs  Lab 11/02/19 0459 11/03/19 0720 11/04/19 0459  WBC 12.4* 13.9* 12.3*  HGB 10.9* 11.1* 10.2*  HCT 34.8* 36.6 33.0*  PLT 353 360 350    ABG Recent Labs  Lab 10/31/19 1000 10/31/19 1409 11/01/19 0337  PHART 7.196* 7.397 7.437  PCO2ART 67.8* 42.6 38.8  PO2ART 87.0 266.0* 106.0    Coag's No results for input(s): APTT, INR in the last 168 hours.  Sepsis Markers Recent Labs  Lab 10/31/19 [phone]  10/31/19 1157  LATICACIDVEN 4.1* 1.2    Cardiac Enzymes No results for input(s): TROPONINI, PROBNP in the last 168 hours.  PAST MEDICAL HISTORY :   She  has a past medical history of Coronary artery disease involving native coronary artery of native heart with angina pectoris ([place]) (01/2013), Daily headache, Depression, Dyslipidemia, goal LDL below 70, History of Doppler ultrasound, History of Takotsubo cardiomyopathy (11/2018), Hypertension, Ischemic cardiomyopathy (06/2018), Mild dysplasia of cervix ([place]) (02/04/2019), STEMI involving left anterior descending coronary artery ([place]) (09/2017; 06/2018), STEMI involving oth coronary artery of inferior wall ([place]) (03/2013), Tobacco abuse (01/11/2016), and Type II diabetes mellitus ([place]) (12/2010).  PAST SURGICAL HISTORY:  She  has a past surgical history that includes Cholecystectomy (~ 2000); Tubal ligation (1994); left heart catheterization with coronary angiogram (N/A, 02/25/2013); left heart catheterization with coronary angiogram (N/A, 04/01/2013); percutaneous coronary stent intervention (pci-s) (04/01/2013); LEFT HEART CATH AND CORONARY ANGIOGRAPHY (N/A, 10/10/2017); CORONARY STENT INTERVENTION (N/A,  10/10/2017); NM MYOVIEW LTD (12/2015); Percutaneous coronary stent intervention (pci-s) (02/25/2013); LEFT HEART CATH AND CORONARY ANGIOGRAPHY (N/A, 07/03/2018); CORONARY STENT INTERVENTION (N/A, 07/03/2018); transthoracic echocardiogram (07/04/2018); transthoracic echocardiogram (02/14/2019); and transthoracic echocardiogram (12/18/2018).  No Known Allergies  No current facility-administered medications on file prior to encounter.   Current Outpatient Medications on File Prior to Encounter  Medication Sig  . atorvastatin (LIPITOR) 40 MG tablet Take 40 mg by mouth 2 (two) times daily.   . carvedilol (COREG) 6.25 MG tablet Take 1 tablet (6.25 mg total) by mouth 2 (two) times daily.  . furosemide (LASIX) 40 MG tablet Take 1 tablet (40 mg total) by mouth daily. (Patient taking differently: Take 20 mg by mouth daily. )  . glipiZIDE (GLUCOTROL) 10 MG tablet Take 1 tablet (10 mg total) by mouth 2 (two) times daily before a meal.  . Insulin Glargine (LANTUS SOLOSTAR) 100 UNIT/ML Solostar Pen Inject 30 Units into the skin daily.  . sacubitril-valsartan (ENTRESTO) 24-26 MG Take 1 tablet by mouth 2 (two) times daily.  Marland Kitchen spironolactone (ALDACTONE) 25 MG tablet Take 0.5 tablets (12.5 mg total) by mouth daily.  . ticagrelor (BRILINTA) 60 MG TABS tablet Take 1 tablet (60 mg total) by mouth 2 (two) times daily.  Marland Kitchen atorvastatin (LIPITOR) 80 MG tablet Take 1 tablet (80 mg total) by mouth daily. (Patient not taking: Reported on 10/31/2019)  . blood glucose meter kit and supplies Dispense based on patient and insurance preference. Use up to four times daily as directed. (FOR ICD-10 E10.9, E11.9).  Marland Kitchen Blood Glucose Monitoring Suppl (TRUE METRIX METER) DEVI 1 kit by Does not apply route 4 (four) times daily.  Marland Kitchen gabapentin (NEURONTIN) 300 MG capsule Take 1 capsule (300 mg total) by mouth at bedtime. (Patient not taking: Reported on 10/31/2019)  . glucose blood test strip Use as instructed  . nitroGLYCERIN (NITROSTAT) 0.4 MG  SL tablet Place 1 tablet (0.4 mg total) under the tongue every 5 (five) minutes as needed for chest pain. Reported on 11/25/2015  . sacubitril-valsartan (ENTRESTO) 49-51 MG Take 1 tablet by mouth 2 (two) times daily. (Patient not taking: Reported on 10/31/2019)  . TRUEPLUS LANCETS 28G MISC 28 g by Does not apply route 4 (four) times daily.    FAMILY HISTORY:   Her family history includes Diabetes in her mother, sister, sister, son, and son.  SOCIAL HISTORY:  She  reports that she has been smoking cigarettes. She has been smoking about 0.00 packs per day for the past 22.00 years. She has never used smokeless  tobacco. She reports current alcohol use. She reports current drug use. Drug: Marijuana.  REVIEW OF SYSTEMS:    HA. Denies chest pain, shortness of breath, and dizziness.    Welford Roche, MD  Internal Medicine PGY-3    PCCM Attending:   49 yo FM, h/o CAD, h/o stemi, nstemi and t2DM.   Went for [facility] this AM. Cors clear.   BP (!) 158/71 (BP Location: Left Arm)   Pulse 68   Temp 98.2 F (36.8 C) (Oral)   Resp 16   Ht 5' 5"  (1.651 m)   Wt 109.4 kg   LMP 10/20/2015   SpO2 100%   BMI 40.13 kg/m   Gen: middle aged fm, resting in bed, comfortable  Heart: rrr, s1 s2 Lungs: CTAB, no wheeze   Labs reviewed  Cath report reviewed   A: HTN urgency  Acute systolic heart failure  Acute Bilateral pulmonary edema, resolved  H/o cad s/p pci  Hyperglycemia with DMT2   P: We appreciate cardiology input  Stable for transfer from the ICU to [facility] service for pick up tomorrow  On entresto spironolactone, carvedilol  Statin and brilinta   Garner Nash, DO [facility] 11/04/2019 1:40 PM

## 2019-11-04 NOTE — Progress Notes (Signed)
Physical Therapy Treatment Patient Details Name: Amy Chaney MRN: [id number] DOB: May 17, 1971 Today's Date: 11/04/2019    History of Present Illness Pt adm hypertensive emergency with acute systolic and diastolic CHF and hypoxemic respiratory failure. Pt intubated 4/2-4/4. PMH - CAD, heart failure, HTN, STEMI, DM    PT Comments    Pt with c/o of headache on entry, RN had just given Tylenol.  Pt agreeable to work with therapy, requiring supervision for bed mobility and transfers and min guard for ambulation of 400 feet without AD. Pt educated on energy conservation with d/c home either today or tomorrow.    Follow Up Recommendations  No PT follow up;Supervision - Intermittent     Equipment Recommendations  None recommended by PT       Precautions / Restrictions Precautions Precautions: Fall Restrictions Weight Bearing Restrictions: No    Mobility  Bed Mobility Overal bed mobility: Needs Assistance Bed Mobility: Supine to Sit;Sit to Supine     Supine to sit: Supervision Sit to supine: Supervision   General bed mobility comments: Assist to elevate trunk into sitting  Transfers Overall transfer level: Needs assistance Equipment used: None Transfers: Sit to/from Stand Sit to Stand: Supervision         General transfer comment: increased time and effort, use of grab bar to power up from low toilet  Ambulation/Gait Ambulation/Gait assistance: Min guard Gait Distance (Feet): 400 Feet Assistive device: None Gait Pattern/deviations: Step-through pattern;Decreased stride length Gait velocity: decr Gait velocity interpretation: 1.31 - 2.62 ft/sec, indicative of limited community ambulator General Gait Details: min guard for safety, and management of IV pole         Balance Overall balance assessment: Mild deficits observed, not formally tested                                          Cognition Arousal/Alertness: Awake/alert Behavior During  Therapy: WFL for tasks assessed/performed Overall Cognitive Status: Within Functional Limits for tasks assessed                                           General Comments General comments (skin integrity, edema, etc.): SaO2 on RA >88%O2 with ambulation, however poor pleth waveform at rest SaO2 98%O2      Pertinent Vitals/Pain Pain Assessment: 0-10 Pain Score: 6  Pain Location: headache Pain Descriptors / Indicators: Aching;Throbbing Pain Intervention(s): Limited activity within patient's tolerance;Monitored during session;Premedicated before session           PT Goals (current goals can now be found in the care plan section) Acute Rehab PT Goals Patient Stated Goal: return home PT Goal Formulation: With patient Time For Goal Achievement: 11/10/19 Potential to Achieve Goals: Good Progress towards PT goals: Progressing toward goals    Frequency    Min 3X/week      PT Plan Current plan remains appropriate       AM-PAC PT "6 Clicks" Mobility   Outcome Measure  Help needed turning from your back to your side while in a flat bed without using bedrails?: None Help needed moving from lying on your back to sitting on the side of a flat bed without using bedrails?: A Little Help needed moving to and from a bed to a chair (including a wheelchair)?: None Help  needed standing up from a chair using your arms (e.g., wheelchair or bedside chair)?: None Help needed to walk in hospital room?: None Help needed climbing 3-5 steps with a railing? : A Little 6 Click Score: 22    End of Session Equipment Utilized During Treatment: Gait belt Activity Tolerance: Patient tolerated treatment well Patient left: with call bell/phone within reach;in bed Nurse Communication: Mobility status PT Visit Diagnosis: Other abnormalities of gait and mobility (R26.89);Muscle weakness (generalized) (M62.81)     Time: [id number] PT Time Calculation (min) (ACUTE ONLY): 25  min  Charges:  $Gait Training: 8-22 mins $Therapeutic Activity: 8-22 mins                     Merit Gadsby B. Migdalia Dk PT, DPT Acute Rehabilitation Services Pager [phone] Office [phone]    [place] 11/04/2019, 3:32 PM

## 2019-11-04 NOTE — Progress Notes (Signed)
PT Cancellation Note  Patient Details Name: Amy Chaney MRN: [id number] DOB: 10/16/1970   Cancelled Treatment:    Reason Eval/Treat Not Completed: (P) Patient at procedure or test/unavailable Pt is off floor for heart catheterization. PT will follow back this afternoon as able.   Casin Federici B. Migdalia Dk PT, DPT Acute Rehabilitation Services Pager [phone] Office [phone]    [place] 11/04/2019, 8:45 AM

## 2019-11-04 NOTE — Progress Notes (Signed)
TR band removed at this time. No complications, site level 0. Pressure dressing applied.

## 2019-11-04 NOTE — Plan of Care (Signed)
  Problem: Education: Goal: Understanding of CV disease, CV risk reduction, and recovery process will improve Outcome: Progressing   Problem: Activity: Goal: Ability to return to baseline activity level will improve Outcome: Progressing   Problem: Cardiovascular: Goal: Ability to achieve and maintain adequate cardiovascular perfusion will improve Outcome: Progressing Goal: Vascular access site(s) Level 0-1 will be maintained Outcome: Progressing   

## 2019-11-05 ENCOUNTER — Other Ambulatory Visit: Payer: Self-pay | Admitting: Pharmacist

## 2019-11-05 ENCOUNTER — Telehealth: Payer: Self-pay

## 2019-11-05 LAB — CULTURE, BLOOD (ROUTINE X 2)
Culture: NO GROWTH
Culture: NO GROWTH
Special Requests: ADEQUATE

## 2019-11-05 MED ORDER — GLUCOSE BLOOD VI STRP: ORAL_STRIP | 12 refills | Status: DC

## 2019-11-05 MED FILL — TRUE METRIX TEST STRIP: 25 days supply | Qty: 100 | Fill #0

## 2019-11-05 MED FILL — Hydralazine HCl Inj 20 MG/ML: INTRAMUSCULAR | Qty: 1 | Status: AC

## 2019-11-05 NOTE — Telephone Encounter (Signed)
Transition Care Management Follow-up Telephone Call  Date of discharge and from where: 11/04/2019 Amy Chaney  How have you been since you were released from the hospital? Patient  stated feeling  much better/    Any questions or concerns?  None verbalized at this time Items Reviewed:  Did the pt receive and understand the discharge instructions provided?  Yes   Medications obtained and verified?  YES   Any new allergies since your discharge? NONE VERBALIZED  Dietary orders reviewed? Pt and domestic partner aware Do you have support at home?  Yes, family  Functional Questionnaire: (I = Independent and D = Dependent) ADLs: I   Follow up appointments reviewed:   PCP Hospital f/u appt confirmed? Scheduled to see Juluis Mire on 11/17/2019 @ 10:50.  [facility] f/u appt confirmed? Not yet scheduled  Are transportation arrangements needed? NO  If their condition worsens, is the pt aware to call PCP or go to the Emergency Dept.? YES  Was the patient provided with contact information for the PCP's office or ED? YES contact and phone number provided  Was to pt encouraged to call back with questions or concerns? Yes encouraged pt and domestic partner to call us with any questions or concerns  Reinforced instructions Record daily weight on same scale at same time of day. Stress adherence to prescribed meds regime and diet. Sodium diet and increase your activity as you feel able. Referrer to UC/ED and call MD if experiencing following symptoms  3-4 pound weight gain in 1-2 days or 2 pounds overnight, Shortness of breath, with or without a dry hacking cough, chest pain, difficulty breathing, blurry vision, confusion, severe headache or pain, swelling in the hands, feet or stomach. Verbalized understanding Stated checking her BP and BG daily / Requested refills on strips/ Refill done today  Instructed pt to keep daily monitoring of weight, BP and BS reading and bring  them to f /u appt with PCP/

## 2019-11-06 NOTE — Telephone Encounter (Signed)
Noted  

## 2019-11-13 ENCOUNTER — Telehealth: Payer: Self-pay | Admitting: Adult Health

## 2019-11-13 NOTE — Telephone Encounter (Signed)
Called and spoke with pt. Notified samples for entresto 24-26 will be left for her at the front desk for her to pick up. Pt verbalized understanding and stated she was coming to get them right now. No other questions at this time.

## 2019-11-13 NOTE — Telephone Encounter (Signed)
Patient calling the office for samples of medication:   1.  What medication and dosage are you requesting samples for? sacubitril-valsartan (ENTRESTO) 24-26 MG  2.  Are you currently out of this medication? Patient is completely out of medication.

## 2019-11-17 ENCOUNTER — Other Ambulatory Visit: Payer: Self-pay

## 2019-11-17 ENCOUNTER — Encounter: Payer: Self-pay | Admitting: Family Medicine

## 2019-11-17 ENCOUNTER — Other Ambulatory Visit: Payer: Self-pay | Admitting: Family Medicine

## 2019-11-17 ENCOUNTER — Ambulatory Visit: Payer: MEDICAID | Attending: Family Medicine | Admitting: Family Medicine

## 2019-11-17 VITALS — BP 121/78 | HR 74 | Ht 69.0 in | Wt 242.2 lb

## 2019-11-17 DIAGNOSIS — F17200 Nicotine dependence, unspecified, uncomplicated: Secondary | ICD-10-CM

## 2019-11-17 DIAGNOSIS — E1149 Type 2 diabetes mellitus with other diabetic neurological complication: Secondary | ICD-10-CM

## 2019-11-17 DIAGNOSIS — I5042 Chronic combined systolic (congestive) and diastolic (congestive) heart failure: Secondary | ICD-10-CM

## 2019-11-17 DIAGNOSIS — I25110 Atherosclerotic heart disease of native coronary artery with unstable angina pectoris: Secondary | ICD-10-CM

## 2019-11-17 DIAGNOSIS — I110 Hypertensive heart disease with heart failure: Secondary | ICD-10-CM

## 2019-11-17 DIAGNOSIS — E1165 Type 2 diabetes mellitus with hyperglycemia: Secondary | ICD-10-CM

## 2019-11-17 LAB — GLUCOSE, POCT (MANUAL RESULT ENTRY)
POC Glucose: 412 mg/dl — AB (ref 70–99)
POC Glucose: 370 mg/dl — AB (ref 70–99)

## 2019-11-17 MED ORDER — INSULIN ASPART 100 UNIT/ML ~~LOC~~ SOLN
8.0000 [IU] | Freq: Once | SUBCUTANEOUS | Status: AC
  Administered 2019-11-17: 8 [IU] via SUBCUTANEOUS

## 2019-11-17 MED ORDER — LANTUS SOLOSTAR 100 UNIT/ML ~~LOC~~ SOPN: 33.0000 [IU] | PEN_INJECTOR | Freq: Every day | SUBCUTANEOUS | 3 refills | Status: DC

## 2019-11-17 MED ORDER — GLIPIZIDE 10 MG PO TABS: 10.0000 mg | ORAL_TABLET | Freq: Two times a day (BID) | ORAL | 1 refills | Status: DC

## 2019-11-17 MED ORDER — BUPROPION HCL ER (SR) 150 MG PO TB12: 150.0000 mg | ORAL_TABLET | Freq: Two times a day (BID) | ORAL | 3 refills | Status: DC

## 2019-11-17 MED ORDER — GABAPENTIN 300 MG PO CAPS: 300.0000 mg | ORAL_CAPSULE | Freq: Every day | ORAL | 3 refills | Status: DC

## 2019-11-17 MED FILL — ?BASAGLAR 100 UNITS/ML KWPE: 100 | 27 days supply | Qty: 9 | Fill #0

## 2019-11-17 MED FILL — BUPROPION SR 150 MG TABLET: 150 | 30 days supply | Qty: 60 | Fill #0

## 2019-11-17 MED FILL — GABAPENTIN 300 MG CAPSULE: 300 | 30 days supply | Qty: 30 | Fill #0

## 2019-11-17 MED FILL — ?GLIPIZIDE 10 MG TABLET: 10 | 30 days supply | Qty: 60 | Fill #2

## 2019-11-17 MED FILL — ?FUROSEMIDE 80MG TABLET: 80 | 30 days supply | Qty: 15 | Fill #4

## 2019-11-17 NOTE — Patient Instructions (Signed)
Heart Failure Eating Plan Heart failure, also called congestive heart failure, occurs when your heart does not pump blood well enough to meet your body's needs for oxygen-rich blood. Heart failure is a long-term (chronic) condition. Living with heart failure can be challenging. However, following your health care provider's instructions about a healthy lifestyle and working with a diet and nutrition specialist (dietitian) to choose the right foods may help to improve your symptoms. What are tips for following this plan? Reading food labels  Check food labels for the amount of sodium per serving. Choose foods that have less than 140 mg (milligrams) of sodium in each serving.  Check food labels for the number of calories per serving. This is important if you need to limit your daily calorie intake to lose weight.  Check food labels for the serving size. If you eat more than one serving, you will be eating more sodium and calories than what is listed on the label.  Look for foods that are labeled as "sodium-free," "very low sodium," or "low sodium." ? Foods labeled as "reduced sodium" or "lightly salted" may still have more sodium than what is recommended for you. Cooking  Avoid adding salt when cooking. Ask your health care provider or dietitian before using salt substitutes.  Season food with salt-free seasonings, spices, or herbs. Check the label of seasoning mixes to make sure they do not contain salt.  Cook with heart-healthy oils, such as olive, canola, soybean, or sunflower oil.  Do not fry foods. Cook foods using low-fat methods, such as baking, boiling, grilling, and broiling.  Limit unhealthy fats when cooking by: ? Removing the skin from poultry, such as chicken. ? Removing all visible fats from meats. ? Skimming the fat off from stews, soups, and gravies before serving them. Meal planning   Limit your intake of: ? Processed, canned, or pre-packaged foods. ? Foods that are  high in trans fat, such as fried foods. ? Sweets, desserts, sugary drinks, and other foods with added sugar. ? Full-fat dairy products, such as whole milk.  Eat a balanced diet that includes: ? 4-5 servings of fruit each day and 4-5 servings of vegetables each day. At each meal, try to fill half of your plate with fruits and vegetables. ? Up to 6-8 servings of whole grains each day. ? Up to 2 servings of lean meat, poultry, or fish each day. One serving of meat is equal to 3 oz. This is about the same size as a deck of cards. ? 2 servings of low-fat dairy each day. ? Heart-healthy fats. Healthy fats called omega-3 fatty acids are found in foods such as flaxseed and cold-water fish like sardines, salmon, and mackerel.  Aim to eat 25-35 g (grams) of fiber a day. Foods that are high in fiber include apples, broccoli, carrots, beans, peas, and whole grains.  Do not add salt or condiments that contain salt (such as soy sauce) to foods before eating.  When eating at a restaurant, ask that your food be prepared with less salt or no salt, if possible.  Try to eat 2 or more vegetarian meals each week.  Eat more home-cooked food and eat less restaurant, buffet, and fast food. General information  Do not eat more than 2,300 mg of salt (sodium) a day. The amount of sodium that is recommended for you may be lower, depending on your condition.  Maintain a healthy body weight as directed. Ask your health care provider what a healthy weight is  for you. ? Check your weight every day. ? Work with your health care provider and dietitian to make a plan that is right for you to lose weight or maintain your current weight.  Limit how much fluid you drink. Ask your health care provider or dietitian how much fluid you can have each day.  Limit or avoid alcohol as told by your health care provider or dietitian. Recommended foods The items listed may not be a complete list. Talk with your dietitian about what  dietary choices are best for you. Fruits All fresh, frozen, and canned fruits. Dried fruits, such as raisins, prunes, and cranberries. Vegetables All fresh vegetables. Vegetables that are frozen without sauce or added salt. Low-sodium or sodium-free canned vegetables. Grains Bread with less than 80 mg of sodium per slice. Whole-wheat pasta, quinoa, and brown rice. Oats and oatmeal. Barley. [place]. Grits and cream of wheat. Whole-grain and whole-wheat cold cereal. Meats and other protein foods Lean cuts of meat. Skinless chicken and [place]. Fish with high omega-3 fatty acids, such as salmon, sardines, and other cold-water fishes. Eggs. Dried beans, peas, and edamame. Unsalted nuts and nut butters. Dairy Low-fat or nonfat (skim) milk and dried milk. Rice milk, soy milk, and almond milk. Low-fat or nonfat yogurt. Small amounts of reduced-sodium block cheese. Low-sodium cottage cheese. Fats and oils Olive, canola, soybean, flaxseed, or sunflower oil. Avocado. Sweets and desserts Apple sauce. Granola bars. Sugar-free pudding and gelatin. Frozen fruit bars. Seasoning and other foods Fresh and dried herbs. Lemon or lime juice. Vinegar. Low-sodium ketchup. Salt-free marinades, salad dressings, sauces, and seasonings. The items listed above may not be a complete list of foods and beverages you can eat. Contact a dietitian for more information. Foods to avoid The items listed may not be a complete list. Talk with your dietitian about what dietary choices are best for you. Fruits Fruits that are dried with sodium-containing preservatives. Vegetables Canned vegetables. Frozen vegetables with sauce or seasonings. Creamed vegetables. [place] fries. Onion rings. Pickled vegetables and sauerkraut. Grains Bread with more than 80 mg of sodium per slice. Hot or cold cereal with more than 140 mg sodium per serving. Salted pretzels and crackers. Pre-packaged breadcrumbs. Bagels, croissants, and biscuits. Meats  and other protein foods Ribs and chicken wings. Bacon, ham, pepperoni, bologna, salami, and packaged luncheon meats. Hot dogs, bratwurst, and sausage. Canned meat. Smoked meat and fish. Salted nuts and seeds. Dairy Whole milk, half-and-half, and cream. Buttermilk. Processed cheese, cheese spreads, and cheese curds. Regular cottage cheese. Feta cheese. Shredded cheese. String cheese. Fats and oils Butter, lard, shortening, ghee, and bacon fat. Canned and packaged gravies. Seasoning and other foods Onion salt, garlic salt, table salt, and sea salt. Marinades. Regular salad dressings. Relishes, pickles, and olives. Meat flavorings and tenderizers, and bouillon cubes. Horseradish, ketchup, and mustard. Worcestershire sauce. Teriyaki sauce, soy sauce (including reduced sodium). Hot sauce and Tabasco sauce. Steak sauce, fish sauce, oyster sauce, and cocktail sauce. Taco seasonings. Barbecue sauce. Tartar sauce. The items listed above may not be a complete list of foods and beverages you should avoid. Contact a dietitian for more information. Summary  A heart failure eating plan includes changes that limit your intake of sodium and unhealthy fat, and it may help you lose weight or maintain a healthy weight. Your health care provider may also recommend limiting how much fluid you drink.  Most people with heart failure should eat no more than 2,300 mg of salt (sodium) a day. The amount of sodium  that is recommended for you may be lower, depending on your condition.  Contact your health care provider or dietitian before making any major changes to your diet. This information is not intended to replace advice given to you by your health care provider. Make sure you discuss any questions you have with your health care provider. Document Revised: 09/12/2018 Document Reviewed: 12/01/2016 Elsevier Patient Education  2020 Elsevier Inc.  

## 2019-11-17 NOTE — Progress Notes (Signed)
Subjective:  Patient ID: Amy Chaney, female    DOB: Mar 17, 1971  Age: 49 y.o. MRN: [id number]  CC: Hospitalization Follow-up   HPI Amy Chaney is a 49 year old female with a history of type 2 diabetes mellitus (A1c 8.2), hypertension, CHF (EF 20-25% which is down from 45-50% 01/2019 ), CAD (status post PCI to LAD with 3 overlapping DES in 09/2017), STEMI in 06/2018,  hospitalization for flash pulmonary edema and Takotsubo cardiomyopathy in 11/2018, recently hospitalized from 10/31/2019 through 11/04/2019 for acute respiratory failure secondary to pulmonary edema and hypertensive emergency.  Discharge notes reveal noncompliance with medications however she states she had been compliant with her medications. Right and left heart cardiac catheterization revealed stable three-vessel CAD, widely patent stents in LAD and RCA with known CTO of diagonal branch and moderate OM disease. She was diuresed and her medications were optimized and she was discharged.  Weight was 242 lbs before using the bathroom today and she has been compliant with her medications.  She denies presence of chest pain, dyspnea. Cardiology appointment comes up on 11/25/19.  Her CBG was 412 this morning and she attributes this to her breakfast. Fasting sugars have been less than 150. She has neuropathy in her feet but has been afraid to take Gabapentin as her son and sister had side effects from the medication.  States that her son was urinating on himself after taking Gabapentin. She has no additional concerns today.  Past Medical History:  Diagnosis Date  . Coronary artery disease involving native coronary artery of native heart with angina pectoris ([place]) 01/2013   a. s/p PCI of the mid RCA 01/2013 //  b. LHC 9/14: EF 55%, RCA stent 100% occluded, LAD irregularities >>  subacute stent thrombosis, Promus DES PCI distal overlap // c) 09/2017 - Ant STEMI - LAD-D2 PCI - Successful PCI of LAD (3 overlapping DES), unable to restore  flow down D2l that was stented as well.  Likely related to downstream dissection, but unable to rewire.  . Daily headache   . Depression   . Dyslipidemia, goal LDL below 70   . History of Doppler ultrasound    carotid bruit >> a. Carotid US 6/17: bilat ICA 1-39%  . History of Takotsubo cardiomyopathy 11/2018   Admitted for acute on chronic combined systolic and diastolic heart failure.  EF down to 20-25% global hypokinesis.  Was in the setting of grieving over the loss of her son from MI.  ->  EF improved to 45-50% on follow-up echocardiogram.  . Hypertension   . Ischemic cardiomyopathy 06/2018   s/p inferior STEMI followed by 2 anterior STEMIs: Following recovery from Takotsubo Cardiomyopathy- Echo 02/14/19: Moderate LVH.  EF improved up to 45-50%.  GR 1 DD.  Severe apical akinesis.  Normal valves.  . Mild dysplasia of cervix (CIN I) 02/04/2019   Seen after colposcopy on 01/23/19 done for ASCU +HPV pap  > repeat pap and HPV test in 12 and 24 months  . STEMI involving left anterior descending coronary artery ([place]) 09/2017; 06/2018   a) Severe [place] 1, 1, 1 LAD-D2 lesion (complicated by post PTCA dissection/intramural hematoma) -successful extensive PCI of the LAD but unable to maintain patency of the stented D2. b) distal mLAD stent Edge 100% thrombosis --> PTCA & Overlapping DES PCI (Synergy 3 x 12 --> 3.6-3.3 mm). D2 occluded. RCA stents patent, 65 % OM2. EF 30-35%.  Marland Kitchen STEMI involving oth coronary artery of inferior wall ([place]) 03/2013   Secondary to  subacute stent thrombosis of RCA stent segment having missed 4 doses of Effient.  . Tobacco abuse 01/11/2016  . Type II diabetes mellitus ([place]) 12/2010   sister and son also diabetic     Past Surgical History:  Procedure Laterality Date  . CHOLECYSTECTOMY  ~ 2000  . CORONARY STENT INTERVENTION N/A 10/10/2017   Procedure: CORONARY STENT INTERVENTION;  Surgeon: Leonie Man, MD;  Location: [place] [facility];  Service: Cardiovascular; LAD PCI:  STENT SYNERGY DES 3.5X32 (p),STENT SYNERGY DES 3.5X 8 (m), STENT SYNERGY DES 3.5X28 (d).  D2 PCI: STENT SYNERGY DES 2.5X24.  (UNABL TO REWIRE & EXPAND - TO OF DIAG AT END OF CASE)  . CORONARY STENT INTERVENTION N/A 07/03/2018   Procedure: CORONARY STENT INTERVENTION;  Surgeon: Leonie Man, MD;  Location: [facility];; PTCA & overlapping DES PCI (overlaps prior stents distally) - Synergy DES 3 x 12 (3.6 mm @ overlap -- 3.3 mm distally)>  . LEFT HEART CATH AND CORONARY ANGIOGRAPHY N/A 10/10/2017   Procedure: LEFT HEART CATH AND CORONARY ANGIOGRAPHY;  Surgeon: Leonie Man, MD;  Location: [place] [facility];  Service: Cardiovascular;  Laterality: N/A; -patent RCA stents with mild in-stent restenosis. [place] 1,1,1 mLD-D2 06% (complicated by dissection/intramural thrombus)  . LEFT HEART CATH AND CORONARY ANGIOGRAPHY N/A 07/03/2018   Procedure: LEFT HEART CATH AND CORONARY ANGIOGRAPHY;  Surgeon: Leonie Man, MD;  Location: [place] [facility];; 100% thrombotic occlusion distal LAD stent -- DES PCI. continued 100% D2 (previously lost). patent RCA stents, 65% OM2.  EF 30-35%.  Marland Kitchen LEFT HEART CATHETERIZATION WITH CORONARY ANGIOGRAM N/A 02/25/2013   Procedure: LEFT HEART CATHETERIZATION WITH CORONARY ANGIOGRAM;  Surgeon: Laverda Page, MD;  Location: [facility] CATH LAB;  Service: Cardiovascular;; CTO m RCA; otherwise normal coronaries  . LEFT HEART CATHETERIZATION WITH CORONARY ANGIOGRAM N/A 04/01/2013   Procedure: LEFT HEART CATHETERIZATION WITH CORONARY ANGIOGRAM;  Surgeon: Laverda Page, MD;  Location: [facility] CATH LAB;  Service: Cardiovascular: 100% occlusion of distal RCA stent (subacute stent thrombosis -  secondary to stopping Effient).  Overlapping DES PCI  . NM MYOVIEW LTD  12/2015    EF 38%.  Hypertensive response to exercise (231/132 mmHg).  INTERMEDIATE RISK due to -diffuse hypokinesis and reduced EF.  No ischemia or infarction.  Marland Kitchen PERCUTANEOUS CORONARY STENT INTERVENTION (PCI-S)  04/01/2013    Procedure: PERCUTANEOUS CORONARY STENT INTERVENTION (PCI-S);  Surgeon: Laverda Page, MD;  Location: [facility] CATH LAB;  Service: Cardiovascular;; PCI of distal RCA stent subacute thrombosis: Promus Premier DES 3.0 mm x 20 mm.  Marland Kitchen PERCUTANEOUS CORONARY STENT INTERVENTION (PCI-S)  02/25/2013   Procedure: PERCUTANEOUS CORONARY STENT INTERVENTION (PCI-S);  Surgeon: Laverda Page, MD;  Location: [facility] CATH LAB;  RCA CTO PCI with overlapping Promus DES: 3.0 mm x 38 mm, 3.5 mm x 61m  . RIGHT/LEFT HEART CATH AND CORONARY ANGIOGRAPHY N/A 11/04/2019   Procedure: RIGHT/LEFT HEART CATH AND CORONARY ANGIOGRAPHY;  Surgeon: HLeonie Man MD;  [facility];  Service: Cardiovascular;  Laterality: N/A;  . TRANSTHORACIC ECHOCARDIOGRAM  07/04/2018   recurrent Anterior STEMI: EF 35-40%.  Apical hypokinesis.  GRII DD.  No thrombus noted.  (Improved EF from 30% up to 35-40% from previous echo)  . TRANSTHORACIC ECHOCARDIOGRAM  02/14/2019   Echo 02/14/19: Moderate LVH.  EF improved up to 45-50%.  GR 1 DD.  Severe apical akinesis.  Normal valves.  . TRANSTHORACIC ECHOCARDIOGRAM  12/18/2018   TAKOTUSBO CM: EF 20-25%-diffuse HK..Marland Kitchen  Moderately increased LV thickness.  GRII DD with elevated LVEDP.  Severe LA dilation.  Suspected Takotsubo's cardiomyopathy.  . TUBAL LIGATION  1994    Family History  Problem Relation Age of Onset  . Diabetes Mother   . Diabetes Sister   . Diabetes Sister   . Diabetes Son        type 1  . Diabetes Son        type 2    No Known Allergies  Outpatient Medications Prior to Visit  Medication Sig Dispense Refill  . atorvastatin (LIPITOR) 40 MG tablet Take 40 mg by mouth 2 (two) times daily.     Marland Kitchen atorvastatin (LIPITOR) 80 MG tablet Take 1 tablet (80 mg total) by mouth daily. 30 tablet 11  . blood glucose meter kit and supplies Dispense based on patient and insurance preference. Use up to four times daily as directed. (FOR ICD-10 E10.9, E11.9). 1 each 0  . Blood Glucose  Monitoring Suppl (TRUE METRIX METER) DEVI 1 kit by Does not apply route 4 (four) times daily. 1 Device 0  . carvedilol (COREG) 3.125 MG tablet Take 1 tablet (3.125 mg total) by mouth 2 (two) times daily. 60 tablet 0  . furosemide (LASIX) 40 MG tablet Take 1 tablet (40 mg total) by mouth daily. (Patient taking differently: Take 20 mg by mouth daily. ) 30 tablet 11  . glipiZIDE (GLUCOTROL) 10 MG tablet Take 1 tablet (10 mg total) by mouth 2 (two) times daily before a meal. 180 tablet 1  . glucose blood test strip Use as instructed 100 each 12  . Insulin Glargine (LANTUS SOLOSTAR) 100 UNIT/ML Solostar Pen Inject 30 Units into the skin daily. 30 mL 3  . nitroGLYCERIN (NITROSTAT) 0.4 MG SL tablet Place 1 tablet (0.4 mg total) under the tongue every 5 (five) minutes as needed for chest pain. Reported on 11/25/2015 25 tablet 3  . sacubitril-valsartan (ENTRESTO) 24-26 MG Take 1 tablet by mouth 2 (two) times daily. 60 tablet 2  . spironolactone (ALDACTONE) 25 MG tablet Take 0.5 tablets (12.5 mg total) by mouth daily. 30 tablet 11  . ticagrelor (BRILINTA) 60 MG TABS tablet Take 1 tablet (60 mg total) by mouth 2 (two) times daily. 60 tablet 11  . TRUEPLUS LANCETS 28G MISC 28 g by Does not apply route 4 (four) times daily. 120 each 2  . gabapentin (NEURONTIN) 300 MG capsule Take 1 capsule (300 mg total) by mouth at bedtime. (Patient not taking: Reported on 10/31/2019) 30 capsule 3   No facility-administered medications prior to visit.     ROS Review of Systems  Constitutional: Negative for activity change, appetite change and fatigue.  HENT: Negative for congestion, sinus pressure and sore throat.   Eyes: Negative for visual disturbance.  Respiratory: Negative for cough, chest tightness, shortness of breath and wheezing.   Cardiovascular: Negative for chest pain and palpitations.  Gastrointestinal: Negative for abdominal distention, abdominal pain and constipation.  Endocrine: Negative for polydipsia.    Genitourinary: Negative for dysuria and frequency.  Musculoskeletal: Negative for arthralgias and back pain.  Skin: Negative for rash.  Neurological: Positive for numbness. Negative for tremors and light-headedness.  Hematological: Does not bruise/bleed easily.  Psychiatric/Behavioral: Negative for agitation and behavioral problems.    Objective:  BP 121/78   Pulse 74   Ht 5' 9"  (1.753 m)   Wt 242 lb 3.2 oz (109.9 kg)   LMP 10/20/2015   SpO2 100%   BMI 35.77 kg/m   BP/Weight  11/17/2019 11/04/2019 1/61/0960  Systolic BP 454 098 119  Diastolic BP 78 71 83  Wt. (Lbs) 242.2 241.18 -  BMI 35.77 40.13 -      Physical Exam Constitutional:      Appearance: She is well-developed.  Neck:     Vascular: No JVD.  Cardiovascular:     Rate and Rhythm: Normal rate.     Heart sounds: Normal heart sounds. No murmur.  Pulmonary:     Effort: Pulmonary effort is normal.     Breath sounds: Normal breath sounds. No wheezing or rales.  Chest:     Chest wall: No tenderness.  Abdominal:     General: Bowel sounds are normal. There is no distension.     Palpations: Abdomen is soft. There is no mass.     Tenderness: There is no abdominal tenderness.  Musculoskeletal:        General: Normal range of motion.     Right lower leg: No edema.     Left lower leg: No edema.  Neurological:     Mental Status: She is alert and oriented to person, place, and time.  Psychiatric:        Mood and Affect: Mood normal.     CMP Latest Ref Rng & Units 11/04/2019 11/04/2019 11/04/2019  Glucose 70 - 99 mg/dL - - -  BUN 6 - 20 mg/dL - - -  Creatinine 0.44 - 1.00 mg/dL - - -  Sodium 135 - 145 mmol/L 139 140 139  Potassium 3.5 - 5.1 mmol/L 3.8 4.0 4.0  Chloride 98 - 111 mmol/L - - -  CO2 22 - 32 mmol/L - - -  Calcium 8.9 - 10.3 mg/dL - - -  Total Protein 6.5 - 8.1 g/dL - - -  Total Bilirubin 0.3 - 1.2 mg/dL - - -  Alkaline Phos 38 - 126 U/L - - -  AST 15 - 41 U/L - - -  ALT 0 - 44 U/L - - -    Lipid Panel      Component Value Date/Time   CHOL 164 07/05/2018 0330   TRIG 85 12/18/2018 0318   HDL 39 (L) 07/05/2018 0330   CHOLHDL 4.2 07/05/2018 0330   VLDL 15 07/05/2018 0330   LDLCALC 110 (H) 07/05/2018 0330    CBC    Component Value Date/Time   WBC 12.3 (H) 11/04/2019 0459   RBC 3.86 (L) 11/04/2019 0459   HGB 11.2 (L) 11/04/2019 0830   HCT 33.0 (L) 11/04/2019 0830   HCT 37.0 11/03/2019 0720   PLT 350 11/04/2019 0459   MCV 85.5 11/04/2019 0459   MCH 26.4 11/04/2019 0459   MCHC 30.9 11/04/2019 0459   RDW 14.6 11/04/2019 0459   LYMPHSABS 6.2 (H) 10/31/2019 0858   MONOABS 0.8 10/31/2019 0858   EOSABS 0.4 10/31/2019 0858   BASOSABS 0.1 10/31/2019 0858    Lab Results  Component Value Date   HGBA1C 8.2 (H) 12/21/2018    Assessment & Plan:  1. Uncontrolled type 2 diabetes mellitus with hyperglycemia (HCC) NovoLog 8 units administered due to elevated blood sugar of 412 blood sugar repeated after observing the patient for 30 minutes Uncontrolled with A1c of 8.2 Increase Lantus to 33 units at bedtime Counseled on Diabetic diet, my plate method, 147 minutes of moderate intensity exercise/week Blood sugar logs with fasting goals of 80-120 mg/dl, random of less than 180 and in the event of sugars less than 60 mg/dl or greater than 400 mg/dl encouraged to  notify the clinic. Advised on the need for annual eye exams, annual foot exams, Pneumonia vaccine. - POCT glucose (manual entry) - Hemoglobin A1c - insulin glargine (LANTUS SOLOSTAR) 100 UNIT/ML Solostar Pen; Inject 33 Units into the skin daily.  Dispense: 30 mL; Refill: 3 - glipiZIDE (GLUCOTROL) 10 MG tablet; Take 1 tablet (10 mg total) by mouth 2 (two) times daily before a meal.  Dispense: 180 tablet; Refill: 1 - insulin aspart (novoLOG) injection 8 Units - POCT glucose (manual entry)  2. Other diabetic neurological complication associated with type 2 diabetes mellitus ([place]) Uncontrolled Advised to commence gabapentin and discussed  sedating side effects. She will take this at night and if unable to tolerate discontinue it. - gabapentin (NEURONTIN) 300 MG capsule; Take 1 capsule (300 mg total) by mouth at bedtime.  Dispense: 30 capsule; Refill: 3  3. Hypertensive heart disease with chronic combined systolic and diastolic congestive heart failure (HCC) Uncontrolled with EF of 25% Underlying ischemic cardiomyopathy I have discussed with her the heart failure diet and limiting fluid intake, low-sodium, cardiac diet Euvolemic at this time Keep appointment with cardiology  4. Coronary artery disease involving native coronary artery of native heart with unstable angina pectoris ([place]) Status post DES Asymptomatic Risk factor modification including smoking cessation  5. Smoker Spent 10 minutes counseling on smoking cessation she is willing to work on quitting Commenced Wellbutrin - buPROPion (WELLBUTRIN SR) 150 MG 12 hr tablet; Take 1 tablet (150 mg total) by mouth 2 (two) times daily.  Dispense: 60 tablet; Refill: 3    Return in about 3 months (around 02/16/2020) for Chronic disease management.   [name], MD, FAAFP. [facility] [place], [place]   11/17/2019, 11:19 AM

## 2019-11-18 LAB — HEMOGLOBIN A1C
Est. average glucose Bld gHb Est-mCnc: 246 mg/dL
Hgb A1c MFr Bld: 10.2 % — ABNORMAL HIGH (ref 4.8–5.6)

## 2019-11-20 ENCOUNTER — Telehealth: Payer: Self-pay

## 2019-11-20 NOTE — Telephone Encounter (Signed)
-----   Message from [name], MD sent at 11/18/2019  2:36 PM EDT ----- A1c is uncontrolled at 10.2, (goal is <7.0) please advise to increase Lantus to 35 units at bedtime and continue with a diabetic diet and lifestyle modifications

## 2019-11-20 NOTE — Telephone Encounter (Signed)
Patient name and DOB has been verified Patient was informed of lab results. Patient had no questions.  

## 2019-11-25 ENCOUNTER — Telehealth: Payer: Self-pay

## 2019-11-25 ENCOUNTER — Encounter: Payer: Self-pay | Admitting: Adult Health

## 2019-11-25 ENCOUNTER — Other Ambulatory Visit: Payer: Self-pay

## 2019-11-25 ENCOUNTER — Ambulatory Visit (INDEPENDENT_AMBULATORY_CARE_PROVIDER_SITE_OTHER): Payer: Medicaid Other | Admitting: Adult Health

## 2019-11-25 VITALS — BP 152/80 | HR 64 | Ht 69.0 in | Wt 241.0 lb

## 2019-11-25 DIAGNOSIS — Z794 Long term (current) use of insulin: Secondary | ICD-10-CM

## 2019-11-25 DIAGNOSIS — I2510 Atherosclerotic heart disease of native coronary artery without angina pectoris: Secondary | ICD-10-CM

## 2019-11-25 DIAGNOSIS — I5042 Chronic combined systolic (congestive) and diastolic (congestive) heart failure: Secondary | ICD-10-CM

## 2019-11-25 DIAGNOSIS — N181 Chronic kidney disease, stage 1: Secondary | ICD-10-CM

## 2019-11-25 DIAGNOSIS — I10 Essential (primary) hypertension: Secondary | ICD-10-CM

## 2019-11-25 DIAGNOSIS — F17200 Nicotine dependence, unspecified, uncomplicated: Secondary | ICD-10-CM

## 2019-11-25 DIAGNOSIS — E1122 Type 2 diabetes mellitus with diabetic chronic kidney disease: Secondary | ICD-10-CM

## 2019-11-25 DIAGNOSIS — I43 Cardiomyopathy in diseases classified elsewhere: Secondary | ICD-10-CM

## 2019-11-25 MED ORDER — SACUBITRIL-VALSARTAN 49-51 MG PO TABS: 1.0000 | ORAL_TABLET | Freq: Two times a day (BID) | ORAL | 11 refills | Status: DC

## 2019-11-25 NOTE — Patient Instructions (Signed)
Medication Instructions:  INCREASE- Entresto 49-51 mg by mouth twice a day  *If you need a refill on your cardiac medications before your next appointment, please call your pharmacy*   Lab Work: None Ordered   Testing/Procedures: None Ordered   Follow-Up: At [facility], you and your health needs are our priority.  As part of our continuing mission to provide you with exceptional heart care, we have created designated Provider Care Teams.  These Care Teams include your primary Cardiologist (physician) and Advanced Practice Providers (APPs -  Physician Assistants and Nurse Practitioners) who all work together to provide you with the care you need, when you need it.  We recommend signing up for the patient portal called "MyChart".  Sign up information is provided on this After Visit Summary.  MyChart is used to connect with patients for Virtual Visits (Telemedicine).  Patients are able to view lab/test results, encounter notes, upcoming appointments, etc.  Non-urgent messages can be sent to your provider as well.   To learn more about what you can do with MyChart, go to [email or web address].    Your next appointment:   Wednesday May 26th @ 2:45 pm  The format for your next appointment:   In Person  Provider:   Jory Sims, DNP, ANP

## 2019-11-25 NOTE — Telephone Encounter (Signed)
Yes. Forms were filled out today. I increased her dose to 49/51 mg BID. They were to be signed by Dr. Ellyn Hack.  Curt Bears

## 2019-11-25 NOTE — Progress Notes (Signed)
Cardiology Office Note   Date:  11/25/2019   ID:  Amy Chaney, DOB 18-Nov-1970, MRN [id number]  PCP:  [name], MD  Cardiologist:   CC: Follow Up   History of Present Illness: Amy Chaney is a 49 y.o. female who presents for ongoing assessment and management of CAD, with hx of PCI of the LAD,diagonal, and RCA, anterior MI in 2019, who we saw on consultation for combined systolic and diastolic CHF with respiratory failure requiring intubation along with hypertensive emergency. She has other history of Type II diabetes, tobacco abuse.  She ultimately underwent right and left cardiac catheterization due to severe LV dysfunction noted on echo with restrictive pattern. EF was < 30%. Cardiac catheterization  and showed stability of her coronary artery disease with stent patency of the previously implanted coronary stents.  She has known chronic occlusion of the diagonal branch of the LAD.  She was continued on Entresto, spironolactone, and carvedilol along with lasix 40 mg daily. She was educated on low sodium diet and to stop smoking.   She comes today feeling tired. She is back to work at [employer] but is unable to perform exertional tasks. Se continues to struggle with salt intake, using [address]" and drinking [place]. BP is elevated today.  Past Medical History:  Diagnosis Date  . Coronary artery disease involving native coronary artery of native heart with angina pectoris ([place]) 01/2013   a. s/p PCI of the mid RCA 01/2013 //  b. LHC 9/14: EF 55%, RCA stent 100% occluded, LAD irregularities >>  subacute stent thrombosis, Promus DES PCI distal overlap // c) 09/2017 - Ant STEMI - LAD-D2 PCI - Successful PCI of LAD (3 overlapping DES), unable to restore flow down D2l that was stented as well.  Likely related to downstream dissection, but unable to rewire.  . Daily headache   . Depression   . Dyslipidemia, goal LDL below 70   . History of Doppler ultrasound    carotid bruit >> a.  Carotid US 6/17: bilat ICA 1-39%  . History of Takotsubo cardiomyopathy 11/2018   Admitted for acute on chronic combined systolic and diastolic heart failure.  EF down to 20-25% global hypokinesis.  Was in the setting of grieving over the loss of her son from MI.  ->  EF improved to 45-50% on follow-up echocardiogram.  . Hypertension   . Ischemic cardiomyopathy 06/2018   s/p inferior STEMI followed by 2 anterior STEMIs: Following recovery from Takotsubo Cardiomyopathy- Echo 02/14/19: Moderate LVH.  EF improved up to 45-50%.  GR 1 DD.  Severe apical akinesis.  Normal valves.  . Mild dysplasia of cervix ([facility] I) 02/04/2019   Seen after colposcopy on 01/23/19 done for ASCU +HPV pap  > repeat pap and HPV test in 12 and 24 months  . STEMI involving left anterior descending coronary artery ([place]) 09/2017; 06/2018   a) Severe [place] 1, 1, 1 LAD-D2 lesion (complicated by post PTCA dissection/intramural hematoma) -successful extensive PCI of the LAD but unable to maintain patency of the stented D2. b) distal mLAD stent Edge 100% thrombosis --> PTCA & Overlapping DES PCI (Synergy 3 x 12 --> 3.6-3.3 mm). D2 occluded. RCA stents patent, 65 % OM2. EF 30-35%.  Marland Kitchen STEMI involving oth coronary artery of inferior wall ([place]) 03/2013   Secondary to subacute stent thrombosis of RCA stent segment having missed 4 doses of Effient.  . Tobacco abuse 01/11/2016  . Type II diabetes mellitus ([place]) 12/2010  sister and son also diabetic     Past Surgical History:  Procedure Laterality Date  . CHOLECYSTECTOMY  ~ 2000  . CORONARY STENT INTERVENTION N/A 10/10/2017   Procedure: CORONARY STENT INTERVENTION;  Surgeon: Leonie Man, MD;  Location: [place] [facility];  Service: Cardiovascular; LAD PCI: STENT SYNERGY DES 3.5X32 (p),STENT SYNERGY DES 3.5X 8 (m), STENT SYNERGY DES 3.5X28 (d).  D2 PCI: STENT SYNERGY DES 2.5X24.  (UNABL TO REWIRE & EXPAND - TO OF DIAG AT END OF CASE)  . CORONARY STENT INTERVENTION N/A 07/03/2018    Procedure: CORONARY STENT INTERVENTION;  Surgeon: Leonie Man, MD;  Location: [facility];; PTCA & overlapping DES PCI (overlaps prior stents distally) - Synergy DES 3 x 12 (3.6 mm @ overlap -- 3.3 mm distally)>  . LEFT HEART CATH AND CORONARY ANGIOGRAPHY N/A 10/10/2017   Procedure: LEFT HEART CATH AND CORONARY ANGIOGRAPHY;  Surgeon: Leonie Man, MD;  Location: [place] [facility];  Service: Cardiovascular;  Laterality: N/A; -patent RCA stents with mild in-stent restenosis. [place] 1,1,1 mLD-D2 27% (complicated by dissection/intramural thrombus)  . LEFT HEART CATH AND CORONARY ANGIOGRAPHY N/A 07/03/2018   Procedure: LEFT HEART CATH AND CORONARY ANGIOGRAPHY;  Surgeon: Leonie Man, MD;  Location: [place] [facility];; 100% thrombotic occlusion distal LAD stent -- DES PCI. continued 100% D2 (previously lost). patent RCA stents, 65% OM2.  EF 30-35%.  Marland Kitchen LEFT HEART CATHETERIZATION WITH CORONARY ANGIOGRAM N/A 02/25/2013   Procedure: LEFT HEART CATHETERIZATION WITH CORONARY ANGIOGRAM;  Surgeon: Laverda Page, MD;  Location: [facility] CATH LAB;  Service: Cardiovascular;; CTO m RCA; otherwise normal coronaries  . LEFT HEART CATHETERIZATION WITH CORONARY ANGIOGRAM N/A 04/01/2013   Procedure: LEFT HEART CATHETERIZATION WITH CORONARY ANGIOGRAM;  Surgeon: Laverda Page, MD;  Location: [facility] CATH LAB;  Service: Cardiovascular: 100% occlusion of distal RCA stent (subacute stent thrombosis -  secondary to stopping Effient).  Overlapping DES PCI  . NM MYOVIEW LTD  12/2015    EF 38%.  Hypertensive response to exercise (231/132 mmHg).  INTERMEDIATE RISK due to -diffuse hypokinesis and reduced EF.  No ischemia or infarction.  Marland Kitchen PERCUTANEOUS CORONARY STENT INTERVENTION (PCI-S)  04/01/2013   Procedure: PERCUTANEOUS CORONARY STENT INTERVENTION (PCI-S);  Surgeon: Laverda Page, MD;  Location[facility] CATH LAB;  Service: Cardiovascular;; PCI of distal RCA stent subacute thrombosis: Promus Premier DES 3.0 mm x 20 mm.  Marland Kitchen  PERCUTANEOUS CORONARY STENT INTERVENTION (PCI-S)  02/25/2013   Procedure: PERCUTANEOUS CORONARY STENT INTERVENTION (PCI-S);  Surgeon: Laverda Page, MD;  Location: [facility] CATH LAB;  RCA CTO PCI with overlapping Promus DES: 3.0 mm x 38 mm, 3.5 mm x 8m  . RIGHT/LEFT HEART CATH AND CORONARY ANGIOGRAPHY N/A 11/04/2019   Procedure: RIGHT/LEFT HEART CATH AND CORONARY ANGIOGRAPHY;  Surgeon: HLeonie Man MD;  [place] [facility];  Service: Cardiovascular;  Laterality: N/A;  . TRANSTHORACIC ECHOCARDIOGRAM  07/04/2018   recurrent Anterior STEMI: EF 35-40%.  Apical hypokinesis.  GRII DD.  No thrombus noted.  (Improved EF from 30% up to 35-40% from previous echo)  . TRANSTHORACIC ECHOCARDIOGRAM  02/14/2019   Echo 02/14/19: Moderate LVH.  EF improved up to 45-50%.  GR 1 DD.  Severe apical akinesis.  Normal valves.  . TRANSTHORACIC ECHOCARDIOGRAM  12/18/2018   TAKOTUSBO CM: EF 20-25%-diffuse HK..  Moderately increased LV thickness.  GRII DD with elevated LVEDP.  Severe LA dilation.  Suspected Takotsubo's cardiomyopathy.  . TUBAL LIGATION  1994  Current Outpatient Medications  Medication Sig Dispense Refill  . atorvastatin (LIPITOR) 40 MG tablet Take 40 mg by mouth 2 (two) times daily.     Marland Kitchen atorvastatin (LIPITOR) 80 MG tablet Take 1 tablet (80 mg total) by mouth daily. 30 tablet 11  . blood glucose meter kit and supplies Dispense based on patient and insurance preference. Use up to four times daily as directed. (FOR ICD-10 E10.9, E11.9). 1 each 0  . Blood Glucose Monitoring Suppl (TRUE METRIX METER) DEVI 1 kit by Does not apply route 4 (four) times daily. 1 Device 0  . buPROPion (WELLBUTRIN SR) 150 MG 12 hr tablet Take 1 tablet (150 mg total) by mouth 2 (two) times daily. 60 tablet 3  . carvedilol (COREG) 3.125 MG tablet Take 1 tablet (3.125 mg total) by mouth 2 (two) times daily. 60 tablet 0  . furosemide (LASIX) 40 MG tablet Take 1 tablet (40 mg total) by mouth daily. (Patient taking  differently: Take 20 mg by mouth daily. ) 30 tablet 11  . gabapentin (NEURONTIN) 300 MG capsule Take 1 capsule (300 mg total) by mouth at bedtime. 30 capsule 3  . glipiZIDE (GLUCOTROL) 10 MG tablet Take 1 tablet (10 mg total) by mouth 2 (two) times daily before a meal. 180 tablet 1  . glucose blood test strip Use as instructed 100 each 12  . insulin glargine (LANTUS SOLOSTAR) 100 UNIT/ML Solostar Pen Inject 33 Units into the skin daily. 30 mL 3  . nitroGLYCERIN (NITROSTAT) 0.4 MG SL tablet Place 1 tablet (0.4 mg total) under the tongue every 5 (five) minutes as needed for chest pain. Reported on 11/25/2015 25 tablet 3  . sacubitril-valsartan (ENTRESTO) 24-26 MG Take 1 tablet by mouth 2 (two) times daily. 60 tablet 2  . spironolactone (ALDACTONE) 25 MG tablet Take 0.5 tablets (12.5 mg total) by mouth daily. 30 tablet 11  . ticagrelor (BRILINTA) 60 MG TABS tablet Take 1 tablet (60 mg total) by mouth 2 (two) times daily. 60 tablet 11  . TRUEPLUS LANCETS 28G MISC 28 g by Does not apply route 4 (four) times daily. 120 each 2   No current facility-administered medications for this visit.    Allergies:   Patient has no known allergies.    Social History:  The patient  reports that she has been smoking cigarettes. She has been smoking about 0.00 packs per day for the past 22.00 years. She has never used smokeless tobacco. She reports current alcohol use. She reports current drug use. Drug: Marijuana.   Family History:  The patient's family history includes Diabetes in her mother, sister, sister, son, and son.    ROS: All other systems are reviewed and negative. Unless otherwise mentioned in H&P    PHYSICAL EXAM: VS:  LMP 10/20/2015  , BMI There is no height or weight on file to calculate BMI. GEN: Well nourished, well developed, in no acute distress, obese  HEENT: normal Neck: no JVD, carotid bruits, or masses Cardiac:RRR; no murmurs, rubs, or gallops,no edema  Respiratory:  Clear to  auscultation bilaterally, normal work of breathing GI: soft, nontender, nondistended, + BS MS: no deformity or atrophy Skin: warm and dry, no rash Neuro:  Strength and sensation are intact Psych: euthymic mood, full affect   EKG: Not completed this office visit.  Recent Labs: 10/31/2019: B Natriuretic Peptide 1,339.2; TSH 1.904 11/03/2019: ALT 12; Magnesium 2.0 11/04/2019: BUN 28; Creatinine, Ser 0.76; Hemoglobin 11.2; Platelets 350; Potassium 3.8; Sodium 139  Lipid Panel    Component Value Date/Time   CHOL 164 07/05/2018 0330   TRIG 85 12/18/2018 0318   HDL 39 (L) 07/05/2018 0330   CHOLHDL 4.2 07/05/2018 0330   VLDL 15 07/05/2018 0330   LDLCALC 110 (H) 07/05/2018 0330      Wt Readings from Last 3 Encounters:  11/17/19 242 lb 3.2 oz (109.9 kg)  11/04/19 241 lb 2.9 oz (109.4 kg)  06/19/19 244 lb (110.7 kg)      Other studies Reviewed: Right and Left Cardiac Cath 01/02/9934  LV end diastolic pressure is mildly elevated with only mildly elevated PCWP.  There is no aortic valve stenosis.  Ost 2nd Diag lesion is 100% stenosed.  Previously placed Prox LAD to Mid LAD stent is 10% stenosed & Mid-distal LAD drug eluting stents are widely patent.  Mid LAD to Dist LAD lesion is 30% stenosed in a tapered fashion distal to the stents.Colon Flattery 2nd Mrg to 2nd Mrg lesion is 55% stenosed.  Prox RCA to Mid RCA stent is 5% stenosed.  Mid RCA to Dist RCA lesion is 40% stenosed.   SUMMARY:  Stable 3 V CAD- widely patent stents in LAD & RCA with known CTO of Diag branch & moderate OM disease.  No culprit lesion to explain reduced LVEF  Relatively normal RHC #s wth only mildly elevated LVEDP and PCWP despite systemic hypertension with blood pressures in the 701X systolic.  Echocardiogram 10/31/2019 1. Severe global reduction in LV systolic function; mild LVH; mild LVE;  restrictive filling; mild MR and TR; mild LAE; moderate pulmonary  hypertension.  2. Left ventricular ejection  fraction, by estimation, is 20 to 25%. The  left ventricle has severely decreased function. The left ventricle  demonstrates global hypokinesis. The left ventricular internal cavity size  was mildly dilated. There is mild left  ventricular hypertrophy. Left ventricular diastolic parameters are  consistent with Grade III diastolic dysfunction (restrictive).  3. Right ventricular systolic function is normal. The right ventricular  size is normal. There is moderately elevated pulmonary artery systolic  pressure.  4. Left atrial size was mildly dilated.  5. The mitral valve is normal in structure. Mild mitral valve  regurgitation. No evidence of mitral stenosis.  6. The aortic valve is tricuspid. Aortic valve regurgitation is not  visualized. No aortic stenosis is present.  7. The inferior vena cava is normal in size with <50% respiratory  variability, suggesting right atrial pressure of 8 mmHg.   ASSESSMENT AND PLAN:  1. Hypertensive heart disease: BP is not controlled today. This is multifactorial in the setting of salt intake and dietary indiscretion with high glucose diet. Will increase Enteresto to 49/51 mg daiow ly. She is re-educated on  low sodium diet and reduction of sugar. She verbalizes understanding. She does not appear to be volume overloaded.   2. Hypertension: BP is elevated as above. May need to also increase spironolactone to 25 mg on follow up. Will re-evaluate in one month.  3.Diabetes; Not controlled. Continues to drink sugary soft drinks. She is to see PCP for management. I have educated her on removing all sugary soft drinks from her diet.   4. Tobacco abuse: Smoking cessation is reinforced.   Current medicines are reviewed at length with the patient today.  I have spent 45 minutes dedicated to the care of this patient on the date of this encounter to include pre-visit review of records, assessment, management and diagnostic testing,with shared decision making.   Labs/ tests  ordered today include: BMET   Phill Myron. West Pugh, ANP, [facility]   11/25/2019 7:36 AM    [address] Office [phone] Fax [phone]  Notice: This dictation was prepared with Dragon dictation along with smaller phrase technology. Any transcriptional errors that result from this process are unintentional and may not be corrected upon review.

## 2019-11-25 NOTE — Telephone Encounter (Signed)
Pt still has an active enrollment through [employer]-is her dose increase on Entresto able to be sent to [employer] for Patient Assistance?

## 2019-11-26 MED FILL — ENTRESTO 49 MG-51 MG TABLET: 49-51 | 30 days supply | Qty: 60 | Fill #0

## 2019-11-28 MED FILL — BRILINTA 60 MG TABLET: 60 | 30 days supply | Qty: 60 | Fill #4

## 2019-12-03 MED FILL — ?SPIRONOLACTONE 25 MG TABLE: 25 | 30 days supply | Qty: 15 | Fill #7

## 2019-12-15 ENCOUNTER — Other Ambulatory Visit: Payer: Self-pay | Admitting: Family Medicine

## 2019-12-15 MED FILL — ?CARVEDILOL 3.125 MG TABLET: 3.125 | 30 days supply | Qty: 60 | Fill #4

## 2019-12-16 MED FILL — ?ATORVASTATIN 40MG TABLET: 40 | 30 days supply | Qty: 60 | Fill #0

## 2019-12-24 ENCOUNTER — Other Ambulatory Visit: Payer: Self-pay

## 2019-12-24 ENCOUNTER — Encounter: Payer: Self-pay | Admitting: Adult Health

## 2019-12-24 ENCOUNTER — Other Ambulatory Visit: Payer: Self-pay | Admitting: Adult Health

## 2019-12-24 ENCOUNTER — Ambulatory Visit (INDEPENDENT_AMBULATORY_CARE_PROVIDER_SITE_OTHER): Payer: Self-pay | Admitting: Adult Health

## 2019-12-24 VITALS — BP 162/92 | HR 85 | Ht 69.0 in | Wt 240.0 lb

## 2019-12-24 DIAGNOSIS — I10 Essential (primary) hypertension: Secondary | ICD-10-CM

## 2019-12-24 DIAGNOSIS — E1165 Type 2 diabetes mellitus with hyperglycemia: Secondary | ICD-10-CM

## 2019-12-24 DIAGNOSIS — I43 Cardiomyopathy in diseases classified elsewhere: Secondary | ICD-10-CM

## 2019-12-24 DIAGNOSIS — Z79899 Other long term (current) drug therapy: Secondary | ICD-10-CM

## 2019-12-24 MED ORDER — SPIRONOLACTONE 25 MG PO TABS: 25.0000 mg | ORAL_TABLET | Freq: Two times a day (BID) | ORAL | 3 refills | Status: DC

## 2019-12-24 MED ORDER — CARVEDILOL 12.5 MG PO TABS: 12.5000 mg | ORAL_TABLET | Freq: Two times a day (BID) | ORAL | 3 refills | Status: DC

## 2019-12-24 MED FILL — ?SPIRONOLACTONE 25 MG TABLE: 25 | 30 days supply | Qty: 60 | Fill #0

## 2019-12-24 MED FILL — ?CARVEDILOL 12.5 MG TABLET: 12.5 | 30 days supply | Qty: 60 | Fill #0

## 2019-12-24 NOTE — Progress Notes (Signed)
Cardiology Office Note   Date:  12/24/2019   ID:  TIERNAN MILLIKIN, DOB [birth year], MRN [id number]  PCP:  [name], MD  Cardiologist: Dr. Ellyn Hack No chief complaint on file.    History of Present Illness: Amy Chaney is a 49 y.o. female who presents for ongoing assessment and management of CAD, with hx of PCI of the LAD,diagonal, and RCA, anterior MI in 2019, who we saw on consultation for combined systolic and diastolic CHF with respiratory failure requiring intubation along with hypertensive emergency. She has other history of Type II diabetes, tobacco abuse.  She ultimately underwent right and left cardiac catheterization due to severe LV dysfunction noted on echo with restrictive pattern. EF was < 30%. Cardiac catheterization  and showed stability of her coronary artery disease with stent patency of the previously implanted coronary stents. She has known chronic occlusion of the diagonal branch of the LAD.  She was continued on Entresto, spironolactone, and carvedilol along with lasix 40 mg daily. She was educated on low sodium diet and to stop smoking.   On last office visit blood pressure was not well controlled.  It was multifactorial in the setting of salt intake and dietary indiscretion with high glucose diet.  I increased her Entresto to 49/51 twice daily.    She did not appear to be volume overloaded at that time.  I also considered increasing her spironolactone to 25 mg daily on follow-up appointment if blood pressure was not well controlled.  She was advised to follow-up with her PCP concerning diabetes.  She comes today hypertensive.  Unfortunately the medication adjustments I made last time were not effective.  She also continues to eat high carb high salt diet.  Past Medical History:  Diagnosis Date  . Coronary artery disease involving native coronary artery of native heart with angina pectoris ([place]) 01/2013   a. s/p PCI of the mid RCA 01/2013 //  b. LHC 9/14: EF  55%, RCA stent 100% occluded, LAD irregularities >>  subacute stent thrombosis, Promus DES PCI distal overlap // c) 09/2017 - Ant STEMI - LAD-D2 PCI - Successful PCI of LAD (3 overlapping DES), unable to restore flow down D2l that was stented as well.  Likely related to downstream dissection, but unable to rewire.  . Daily headache   . Depression   . Dyslipidemia, goal LDL below 70   . History of Doppler ultrasound    carotid bruit >> a. Carotid US 6/17: bilat ICA 1-39%  . History of Takotsubo cardiomyopathy 11/2018   Admitted for acute on chronic combined systolic and diastolic heart failure.  EF down to 20-25% global hypokinesis.  Was in the setting of grieving over the loss of her son from MI.  ->  EF improved to 45-50% on follow-up echocardiogram.  . Hypertension   . Ischemic cardiomyopathy 06/2018   s/p inferior STEMI followed by 2 anterior STEMIs: Following recovery from Takotsubo Cardiomyopathy- Echo 02/14/19: Moderate LVH.  EF improved up to 45-50%.  GR 1 DD.  Severe apical akinesis.  Normal valves.  . Mild dysplasia of cervix ([place] [facility]) 02/04/2019   Seen after colposcopy on 01/23/19 done for ASCU +HPV pap  > repeat pap and HPV test in 12 and 24 months  . STEMI involving left anterior descending coronary artery ([place]) 09/2017; 06/2018   a) Severe [place] 1, 1, 1 LAD-D2 lesion (complicated by post PTCA dissection/intramural hematoma) -successful extensive PCI of the LAD but unable to maintain patency of the stented D2.  b) distal mLAD stent Edge 100% thrombosis --> PTCA & Overlapping DES PCI (Synergy 3 x 12 --> 3.6-3.3 mm). D2 occluded. RCA stents patent, 65 % OM2. EF 30-35%.  Marland Kitchen STEMI involving oth coronary artery of inferior wall ([place]) 03/2013   Secondary to subacute stent thrombosis of RCA stent segment having missed 4 doses of Effient.  . Tobacco abuse 01/11/2016  . Type II diabetes mellitus ([place]) 12/2010   sister and son also diabetic     Past Surgical History:  Procedure Laterality Date  .  CHOLECYSTECTOMY  ~ 2000  . CORONARY STENT INTERVENTION N/A 10/10/2017   Procedure: CORONARY STENT INTERVENTION;  Surgeon: Leonie Man, MD;  Location: [facility];  Service: Cardiovascular; LAD PCI: STENT SYNERGY DES 3.5X32 (p),STENT SYNERGY DES 3.5X 8 (m), STENT SYNERGY DES 3.5X28 (d).  D2 PCI: STENT SYNERGY DES 2.5X24.  (UNABL TO REWIRE & EXPAND - TO OF DIAG AT END OF CASE)  . CORONARY STENT INTERVENTION N/A 07/03/2018   Procedure: CORONARY STENT INTERVENTION;  Surgeon: Leonie Man, MD;  Location: [facility];; PTCA & overlapping DES PCI (overlaps prior stents distally) - Synergy DES 3 x 12 (3.6 mm @ overlap -- 3.3 mm distally)>  . LEFT HEART CATH AND CORONARY ANGIOGRAPHY N/A 10/10/2017   Procedure: LEFT HEART CATH AND CORONARY ANGIOGRAPHY;  Surgeon: Leonie Man, MD;  Location: [place] [facility];  Service: Cardiovascular;  Laterality: N/A; -patent RCA stents with mild in-stent restenosis. [place] 1,1,1 mLD-D2 09% (complicated by dissection/intramural thrombus)  . LEFT HEART CATH AND CORONARY ANGIOGRAPHY N/A 07/03/2018   Procedure: LEFT HEART CATH AND CORONARY ANGIOGRAPHY;  Surgeon: Leonie Man, MD;  Location: [facility];; 100% thrombotic occlusion distal LAD stent -- DES PCI. continued 100% D2 (previously lost). patent RCA stents, 65% OM2.  EF 30-35%.  Marland Kitchen LEFT HEART CATHETERIZATION WITH CORONARY ANGIOGRAM N/A 02/25/2013   Procedure: LEFT HEART CATHETERIZATION WITH CORONARY ANGIOGRAM;  Surgeon: Laverda Page, MD;  Location: [facility] CATH LAB;  Service: Cardiovascular;; CTO m RCA; otherwise normal coronaries  . LEFT HEART CATHETERIZATION WITH CORONARY ANGIOGRAM N/A 04/01/2013   Procedure: LEFT HEART CATHETERIZATION WITH CORONARY ANGIOGRAM;  Surgeon: Laverda Page, MD;  Location: [facility] CATH [facility];  Service: Cardiovascular: 100% occlusion of distal RCA stent (subacute stent thrombosis -  secondary to stopping Effient).  Overlapping DES PCI  . NM MYOVIEW LTD  12/2015    EF  38%.  Hypertensive response to exercise (231/132 mmHg).  INTERMEDIATE RISK due to -diffuse hypokinesis and reduced EF.  No ischemia or infarction.  Marland Kitchen PERCUTANEOUS CORONARY STENT INTERVENTION (PCI-S)  04/01/2013   Procedure: PERCUTANEOUS CORONARY STENT INTERVENTION (PCI-S);  Surgeon: Laverda Page, MD;  Location: [facility] CATH LAB;  Service: Cardiovascular;; PCI of distal RCA stent subacute thrombosis: Promus Premier DES 3.0 mm x 20 mm.  Marland Kitchen PERCUTANEOUS CORONARY STENT INTERVENTION (PCI-S)  02/25/2013   Procedure: PERCUTANEOUS CORONARY STENT INTERVENTION (PCI-S);  Surgeon: Laverda Page, MD;  Location: [facility] CATH LAB;  RCA CTO PCI with overlapping Promus DES: 3.0 mm x 38 mm, 3.5 mm x 95m  . RIGHT/LEFT HEART CATH AND CORONARY ANGIOGRAPHY N/A 11/04/2019   Procedure: RIGHT/LEFT HEART CATH AND CORONARY ANGIOGRAPHY;  Surgeon: HLeonie Man MD;  [facility];  Service: Cardiovascular;  Laterality: N/A;  . TRANSTHORACIC ECHOCARDIOGRAM  07/04/2018   recurrent Anterior STEMI: EF 35-40%.  Apical hypokinesis.  GRII DD.  No thrombus noted.  (Improved EF from 30% up to  35-40% from previous echo)  . TRANSTHORACIC ECHOCARDIOGRAM  02/14/2019   Echo 02/14/19: Moderate LVH.  EF improved up to 45-50%.  GR 1 DD.  Severe apical akinesis.  Normal valves.  . TRANSTHORACIC ECHOCARDIOGRAM  12/18/2018   TAKOTUSBO CM: EF 20-25%-diffuse HK..  Moderately increased LV thickness.  GRII DD with elevated LVEDP.  Severe LA dilation.  Suspected Takotsubo's cardiomyopathy.  . TUBAL LIGATION  1994     Current Outpatient Medications  Medication Sig Dispense Refill  . atorvastatin (LIPITOR) 40 MG tablet TAKE 2 TABLETS BY MOUTH DAILY 60 tablet 2  . blood glucose meter kit and supplies Dispense based on patient and insurance preference. Use up to four times daily as directed. (FOR ICD-10 E10.9, E11.9). 1 each 0  . Blood Glucose Monitoring Suppl (TRUE METRIX METER) DEVI 1 kit by Does not apply route 4 (four) times daily. 1  Device 0  . buPROPion (WELLBUTRIN SR) 150 MG 12 hr tablet Take 1 tablet (150 mg total) by mouth 2 (two) times daily. 60 tablet 3  . carvedilol (COREG) 12.5 MG tablet Take 1 tablet (12.5 mg total) by mouth 2 (two) times daily. 180 tablet 3  . furosemide (LASIX) 40 MG tablet Take 20 mg by mouth daily.    Marland Kitchen gabapentin (NEURONTIN) 300 MG capsule Take 1 capsule (300 mg total) by mouth at bedtime. 30 capsule 3  . glipiZIDE (GLUCOTROL) 10 MG tablet Take 1 tablet (10 mg total) by mouth 2 (two) times daily before a meal. 180 tablet 1  . glucose blood test strip Use as instructed 100 each 12  . insulin glargine (LANTUS SOLOSTAR) 100 UNIT/ML Solostar Pen Inject 33 Units into the skin daily. 30 mL 3  . nitroGLYCERIN (NITROSTAT) 0.4 MG SL tablet Place 1 tablet (0.4 mg total) under the tongue every 5 (five) minutes as needed for chest pain. Reported on 11/25/2015 25 tablet 3  . sacubitril-valsartan (ENTRESTO) 49-51 MG Take 1 tablet by mouth 2 (two) times daily. 60 tablet 11  . spironolactone (ALDACTONE) 25 MG tablet Take 1 tablet (25 mg total) by mouth 2 (two) times daily. 180 tablet 3  . ticagrelor (BRILINTA) 60 MG TABS tablet Take 1 tablet (60 mg total) by mouth 2 (two) times daily. 60 tablet 11  . TRUEPLUS LANCETS 28G MISC 28 g by Does not apply route 4 (four) times daily. 120 each 2   No current facility-administered medications for this visit.    Allergies:   Patient has no known allergies.    Social History:  The patient  reports that she has been smoking cigarettes. She has been smoking about 0.00 packs per day for the past 22.00 years. She has never used smokeless tobacco. She reports current alcohol use. She reports current drug use. Drug: Marijuana.   Family History:  The patient's family history includes Diabetes in her mother, sister, sister, son, and son.    ROS: All other systems are reviewed and negative. Unless otherwise mentioned in H&P    PHYSICAL EXAM: VS:  BP (!) 162/92   Pulse 85    Ht 5' 9"  (1.753 m)   Wt 240 lb (108.9 kg)   LMP 10/20/2015   SpO2 95%   BMI 35.44 kg/m  , BMI Body mass index is 35.44 kg/m. GEN: Well nourished, well developed, in no acute distress, morbid obesity HEENT: normal Neck: no JVD, carotid bruits, or masses Cardiac: RRR; S4 murmur is auscultate though shortd is for me so I can sign it to place,  rubs, or gallops,no edema  Respiratory:  Clear to auscultation bilaterally, normal work of breathing GI: soft, nontender, nondistended, + BS MS: no deformity or atrophy Skin: warm and dry, no rash Neuro:  Strength and sensation are intact Psych: euthymic mood, full affect   EKG: Not completed this office visit  Recent Labs: 10/31/2019: B Natriuretic Peptide 1,339.2; TSH 1.904 11/03/2019: ALT 12; Magnesium 2.0 11/04/2019: BUN 28; Creatinine, Ser 0.76; Hemoglobin 11.2; Platelets 350; Potassium 3.8; Sodium 139    Lipid Panel    Component Value Date/Time   CHOL 164 07/05/2018 0330   TRIG 85 12/18/2018 0318   HDL 39 (L) 07/05/2018 0330   CHOLHDL 4.2 07/05/2018 0330   VLDL 15 07/05/2018 0330   LDLCALC 110 (H) 07/05/2018 0330      Wt Readings from Last 3 Encounters:  12/24/19 240 lb (108.9 kg)  11/25/19 241 lb (109.3 kg)  11/17/19 242 lb 3.2 oz (109.9 kg)      Other studies Reviewed: Right and Left Cardiac Cath 11/03/5033  LV end diastolic pressure is mildly elevated with only mildly elevated PCWP.  There is no aortic valve stenosis.  Ost 2nd Diag lesion is 100% stenosed.  Previously placed Prox LAD to Mid LAD stent is 10% stenosed & Mid-distal LAD drug eluting stents are widely patent.  Mid LAD to Dist LAD lesion is 30% stenosed in a tapered fashion distal to the stents.Colon Flattery 2nd Mrg to 2nd Mrg lesion is 55% stenosed.  Prox RCA to Mid RCA stent is 5% stenosed.  Mid RCA to Dist RCA lesion is 40% stenosed.  SUMMARY:  Stable 3 V CAD- widely patent stents in LAD & RCA with known CTO of Diag branch & moderate OM disease.  No  culprit lesion to explain reduced LVEF  Relatively normal RHC #s wth only mildly elevated LVEDP and PCWP despite systemic hypertension with blood pressures in the 465K systolic.  Echocardiogram 10/31/2019 1. Severe global reduction in LV systolic function; mild LVH; mild LVE;  restrictive filling; mild MR and TR; mild LAE; moderate pulmonary  hypertension.  2. Left ventricular ejection fraction, by estimation, is 20 to 25%. The  left ventricle has severely decreased function. The left ventricle  demonstrates global hypokinesis. The left ventricular internal cavity size  was mildly dilated. There is mild left  ventricular hypertrophy. Left ventricular diastolic parameters are  consistent with Grade III diastolic dysfunction (restrictive).  3. Right ventricular systolic function is normal. The right ventricular  size is normal. There is moderately elevated pulmonary artery systolic  pressure.  4. Left atrial size was mildly dilated.  5. The mitral valve is normal in structure. Mild mitral valve  regurgitation. No evidence of mitral stenosis.  6. The aortic valve is tricuspid. Aortic valve regurgitation is not  visualized. No aortic stenosis is present.  7. The inferior vena cava is normal in size with <50% respiratory  variability, suggesting right atrial pressure of 8 mmHg.    ASSESSMENT AND PLAN:  1.  Uncontrolled hypertension: Multifactorial.  She is not adhering to a low-sodium diet.  She states that she is compliant with her medication regimen.  She is severely hypertensive for reduced EF.  I will be more aggressive concerning her blood pressure control.  I will increase her carvedilol to 12.5 mg p.o. twice daily, increase spironolactone to 25 mg twice daily.  We will increase Entresto to highest dose  on next visit.  I will repeat BMET in 1 week.  She will be seen  again in 2 weeks by Dr. Ellyn Hack, hypertensive clinic, or APP for evaluation of her response to medication.  Due to  obesity, may need to be considered for sleep study for OSA as she is resistant to medication regimen currently.  We may need to add a fourth medication hydralazine or isosorbide.  I would like the hypertensive clinic to evaluate her.  I am also giving her a copy of the "salty 6", and have reinforced low-sodium diet.  She is aware of the dangers of hypertension, including CVA, and renal failure, along with MI.  2.  Ischemic cardiomyopathy: As stated above EF of less than 30%.  Becoming more aggressive with blood pressure control.  Close follow-up.  May need to consider referral to Advanced Heart Failure clinic.  3. Uncontrolled Diabetes: Followed by PCP.  She shows me her glucose monitor and recordings are 190-197  She needs to see PCP again for management.   Current medicines are reviewed at length with the patient today.  I have spent 25 minutes dedicated to the care of this patient on the date of this encounter to include pre-visit review of records, assessment, management and diagnostic testing,with shared decision making.  Labs/ tests ordered today include: BMET Phill Myron. [place] Pugh, ANP, AACC   12/24/2019 3:17 PM    [facility] [place] [address] Office [phone] Fax [phone]  Notice: This dictation was prepared with Dragon dictation along with smaller phrase technology. Any transcriptional errors that result from this process are unintentional and may not be corrected upon review.

## 2019-12-24 NOTE — Patient Instructions (Signed)
Medication Instructions:  INCREASE- Spironolactone 25 mg by mouth twice a day INCREASE- Carvedilol 12.5 mg by mouth twice a day  *If you need a refill on your cardiac medications before your next appointment, please call your pharmacy*   Lab Work: BMP in 1 Week  If you have labs (blood work) drawn today and your tests are completely normal, you will receive your results only by: Marland Kitchen MyChart Message (if you have MyChart) OR . A paper copy in the mail If you have any lab test that is abnormal or we need to change your treatment, we will call you to review the results.   Testing/Procedures: None Ordered   Follow-Up: At [facility], you and your health needs are our priority.  As part of our continuing mission to provide you with exceptional heart care, we have created designated Provider Care Teams.  These Care Teams include your primary Cardiologist (physician) and Advanced Practice Providers (APPs -  Physician Assistants and Nurse Practitioners) who all work together to provide you with the care you need, when you need it.  We recommend signing up for the patient portal called "MyChart".  Sign up information is provided on this After Visit Summary.  MyChart is used to connect with patients for Virtual Visits (Telemedicine).  Patients are able to view lab/test results, encounter notes, upcoming appointments, etc.  Non-urgent messages can be sent to your provider as well.   To learn more about what you can do with MyChart, go to [email or web address].    Your next appointment:   2 week(s)  The format for your next appointment:   In Person  Provider:   Glenetta Hew, MD

## 2020-01-05 ENCOUNTER — Other Ambulatory Visit: Payer: Self-pay | Admitting: Family Medicine

## 2020-01-07 NOTE — Telephone Encounter (Signed)
Medication listed as a historical medication on profile. Cardio noted on 12/24/19 that the patient is taking Lasix daily. She has not completed BMET ordered by Cardio. Will forward to PCP for review.

## 2020-01-08 ENCOUNTER — Other Ambulatory Visit: Payer: Self-pay | Admitting: Physician Assistant

## 2020-01-08 ENCOUNTER — Other Ambulatory Visit: Payer: Self-pay

## 2020-01-08 ENCOUNTER — Telehealth: Payer: Self-pay | Admitting: Physician Assistant

## 2020-01-08 ENCOUNTER — Ambulatory Visit (INDEPENDENT_AMBULATORY_CARE_PROVIDER_SITE_OTHER): Payer: Self-pay | Admitting: Physician Assistant

## 2020-01-08 ENCOUNTER — Encounter: Payer: Self-pay | Admitting: Physician Assistant

## 2020-01-08 VITALS — BP 174/76 | HR 74 | Ht 69.0 in | Wt 237.8 lb

## 2020-01-08 DIAGNOSIS — I5042 Chronic combined systolic (congestive) and diastolic (congestive) heart failure: Secondary | ICD-10-CM

## 2020-01-08 DIAGNOSIS — Z79899 Other long term (current) drug therapy: Secondary | ICD-10-CM

## 2020-01-08 DIAGNOSIS — R0789 Other chest pain: Secondary | ICD-10-CM

## 2020-01-08 DIAGNOSIS — E1169 Type 2 diabetes mellitus with other specified complication: Secondary | ICD-10-CM

## 2020-01-08 DIAGNOSIS — I2510 Atherosclerotic heart disease of native coronary artery without angina pectoris: Secondary | ICD-10-CM

## 2020-01-08 DIAGNOSIS — E785 Hyperlipidemia, unspecified: Secondary | ICD-10-CM

## 2020-01-08 DIAGNOSIS — E119 Type 2 diabetes mellitus without complications: Secondary | ICD-10-CM

## 2020-01-08 DIAGNOSIS — Z794 Long term (current) use of insulin: Secondary | ICD-10-CM

## 2020-01-08 LAB — BASIC METABOLIC PANEL
BUN/Creatinine Ratio: 9 (ref 9–23)
BUN: 8 mg/dL (ref 6–24)
CO2: 23 mmol/L (ref 20–29)
Calcium: 9 mg/dL (ref 8.7–10.2)
Chloride: 99 mmol/L (ref 96–106)
Creatinine, Ser: 0.85 mg/dL (ref 0.57–1.00)
GFR calc Af Amer: 92 mL/min/{1.73_m2} (ref >59)
GFR calc non Af Amer: 80 mL/min/{1.73_m2} (ref >59)
Glucose: 333 mg/dL — ABNORMAL HIGH (ref 65–99)
Potassium: 4 mmol/L (ref 3.5–5.2)
Sodium: 136 mmol/L (ref 134–144)

## 2020-01-08 MED ORDER — CARVEDILOL 25 MG PO TABS: 25.0000 mg | ORAL_TABLET | Freq: Two times a day (BID) | ORAL | 3 refills | Status: DC

## 2020-01-08 MED ORDER — SACUBITRIL-VALSARTAN 97-103 MG PO TABS: 1.0000 | ORAL_TABLET | Freq: Two times a day (BID) | ORAL | 0 refills | Status: DC

## 2020-01-08 MED ORDER — PANTOPRAZOLE SODIUM 40 MG PO TBEC: 40.0000 mg | DELAYED_RELEASE_TABLET | Freq: Every day | ORAL | 11 refills | Status: DC

## 2020-01-08 NOTE — Progress Notes (Addendum)
Cardiology Office Note:    Date:  01/11/2020   ID:  Amy Chaney, DOB [birth year], MRN [id number]  PCP:  [name], MD  [facility] Cardiologist:  Glenetta Hew, MD  [place] Electrophysiologist:  None   Referring MD: [name], MD   Chief Complaint  Patient presents with  . Follow-up    seen for Dr. Ellyn Hack.     History of Present Illness:    Amy Chaney is a 49 y.o. female with a hx of CAD, hyperlipidemia, Takotsubo cardiomyopathy, ischemic cardiomyopathy, DM2 and tobacco abuse.  He underwent PCI of RCA on 2 separate occasions in 2014 and PCI/DES x3 to diagonal/LAD in 09/2017.  Procedure was complicated by the loss of second diagonal despite attempted stent placement secondary to dissection and inability to rewire. Ejection fraction in March 2019 was 35%. Patient had a distal stent thrombosis of LAD in December 2019 and underwent repeat DES that overlapped with previously placed stents.  She was admitted in May 2020 with acute heart failure.  Ejection fraction at the time was down to 20 to 25% by echo which was thought to be Takotsubo cardiomyopathy related to her son's death.  Last echocardiogram obtained on 02/14/2019 showed ejection fraction has improved to 45-50%.  She was last seen by Dr. Ellyn Hack in November 2020 at which time she was doing well, she did have mild swelling however largely controlled using diuretic.  More recently, patient was admitted in April with acute on chronic combined systolic and diastolic heart failure.  She ultimately underwent left and right heart cath due to worsening LV function.  Cardiac catheterization showed patent stents, chronically occluded diagonal branch of LAD.  She was discharged on heart failure medication including carvedilol, Entresto and spironolactone.  When she was seen back for follow-up, catheter numbers NP increased her Entresto to 49-51 mg twice daily.  During the last follow-up on 5/26, her spironolactone was  increased to 25 mg twice daily, carvedilol increased to 12.5 mg twice daily.  It was suspected she was not adhering to the low-dose sodium diet which causes her blood pressure to be very elevated.  Patient presents today for cardiology office visit.  She is doing well on the current diuretic dose.  Her blood pressure unfortunately remains quite elevated despite her compliance with heart failure medication.  She also noticed some increased palpitation when she lays down at night.  During the day, she has burning sensation in her chest about twice a day.  I took another look at her coronary anatomy on the last cardiac catheterization in April, it was quite stable.  She says her chest burning sensation has been going on for the past month or so.  I recommended start with a Protonix 40 mg daily for the chest burning.  I will also increase her carvedilol to 25 mg twice daily and her Entresto to 97-103 mg twice daily.  She will need a basic metabolic panel in the next week.  Otherwise, I plan to see the patient back in 1 month for follow-up.   Past Medical History:  Diagnosis Date  . Coronary artery disease involving native coronary artery of native heart with angina pectoris ([place]) 01/2013   a. s/p PCI of the mid RCA 01/2013 //  b. LHC 9/14: EF 55%, RCA stent 100% occluded, LAD irregularities >>  subacute stent thrombosis, Promus DES PCI distal overlap // c) 09/2017 - Ant STEMI - LAD-D2 PCI - Successful PCI of LAD (3 overlapping DES), unable  to restore flow down D2l that was stented as well.  Likely related to downstream dissection, but unable to rewire.  . Daily headache   . Depression   . Dyslipidemia, goal LDL below 70   . History of Doppler ultrasound    carotid bruit >> a. Carotid US 6/17: bilat ICA 1-39%  . History of Takotsubo cardiomyopathy 11/2018   Admitted for acute on chronic combined systolic and diastolic heart failure.  EF down to 20-25% global hypokinesis.  Was in the setting of grieving over  the loss of her son from MI.  ->  EF improved to 45-50% on follow-up echocardiogram.  . Hypertension   . Ischemic cardiomyopathy 06/2018   s/p inferior STEMI followed by 2 anterior STEMIs: Following recovery from Takotsubo Cardiomyopathy- Echo 02/14/19: Moderate LVH.  EF improved up to 45-50%.  GR 1 DD.  Severe apical akinesis.  Normal valves.  . Mild dysplasia of cervix (CIN I) 02/04/2019   Seen after colposcopy on 01/23/19 done for ASCU +HPV pap  > repeat pap and HPV test in 12 and 24 months  . STEMI involving left anterior descending coronary artery ([place]) 09/2017; 06/2018   a) Severe [place] 1, 1, 1 LAD-D2 lesion (complicated by post PTCA dissection/intramural hematoma) -successful extensive PCI of the LAD but unable to maintain patency of the stented D2. b) distal mLAD stent Edge 100% thrombosis --> PTCA & Overlapping DES PCI (Synergy 3 x 12 --> 3.6-3.3 mm). D2 occluded. RCA stents patent, 65 % OM2. EF 30-35%.  Marland Kitchen STEMI involving oth coronary artery of inferior wall ([place]) 03/2013   Secondary to subacute stent thrombosis of RCA stent segment having missed 4 doses of Effient.  . Tobacco abuse 01/11/2016  . Type II diabetes mellitus ([place]) 12/2010   sister and son also diabetic     Past Surgical History:  Procedure Laterality Date  . CHOLECYSTECTOMY  ~ 2000  . CORONARY STENT INTERVENTION N/A 10/10/2017   Procedure: CORONARY STENT INTERVENTION;  Surgeon: Leonie Man, MD;  Location: [place] [facility];  Service: Cardiovascular; LAD PCI: STENT SYNERGY DES 3.5X32 (p),STENT SYNERGY DES 3.5X 8 (m), STENT SYNERGY DES 3.5X28 (d).  D2 PCI: STENT SYNERGY DES 2.5X24.  (UNABL TO REWIRE & EXPAND - TO OF DIAG AT END OF CASE)  . CORONARY STENT INTERVENTION N/A 07/03/2018   Procedure: CORONARY STENT INTERVENTION;  Surgeon: Leonie Man, MD;  Location: [facility];; PTCA & overlapping DES PCI (overlaps prior stents distally) - Synergy DES 3 x 12 (3.6 mm @ overlap -- 3.3 mm distally)>  . LEFT HEART CATH  AND CORONARY ANGIOGRAPHY N/A 10/10/2017   Procedure: LEFT HEART CATH AND CORONARY ANGIOGRAPHY;  Surgeon: Leonie Man, MD;  Location: [place] [facility];  Service: Cardiovascular;  Laterality: N/A; -patent RCA stents with mild in-stent restenosis. [place] 1,1,1 mLD-D2 88% (complicated by dissection/intramural thrombus)  . LEFT HEART CATH AND CORONARY ANGIOGRAPHY N/A 07/03/2018   Procedure: LEFT HEART CATH AND CORONARY ANGIOGRAPHY;  Surgeon: Leonie Man, MD;  Location: [place] [facility];; 100% thrombotic occlusion distal LAD stent -- DES PCI. continued 100% D2 (previously lost). patent RCA stents, 65% OM2.  EF 30-35%.  Marland Kitchen LEFT HEART CATHETERIZATION WITH CORONARY ANGIOGRAM N/A 02/25/2013   Procedure: LEFT HEART CATHETERIZATION WITH CORONARY ANGIOGRAM;  Surgeon: Laverda Page, MD;  Location: [facility] C[facility] LAB;  Service: Cardiovascular;; CTO m RCA; otherwise normal coronaries  . LEFT HEART CATHETERIZATION WITH CORONARY ANGIOGRAM N/A 04/01/2013   Procedure: LEFT HEART  CATHETERIZATION WITH CORONARY ANGIOGRAM;  Surgeon: Laverda Page, MD;  Location: [facility] CATH LAB;  Service: Cardiovascular: 100% occlusion of distal RCA stent (subacute stent thrombosis -  secondary to stopping Effient).  Overlapping DES PCI  . NM MYOVIEW LTD  12/2015    EF 38%.  Hypertensive response to exercise (231/132 mmHg).  INTERMEDIATE RISK due to -diffuse hypokinesis and reduced EF.  No ischemia or infarction.  Marland Kitchen PERCUTANEOUS CORONARY STENT INTERVENTION (PCI-S)  04/01/2013   Procedure: PERCUTANEOUS CORONARY STENT INTERVENTION (PCI-S);  Surgeon: Laverda Page, MD;  Location: [facility] CATH LAB;  Service: Cardiovascular;; PCI of distal RCA stent subacute thrombosis: Promus Premier DES 3.0 mm x 20 mm.  Marland Kitchen PERCUTANEOUS CORONARY STENT INTERVENTION (PCI-S)  02/25/2013   Procedure: PERCUTANEOUS CORONARY STENT INTERVENTION (PCI-S);  Surgeon: Laverda Page, MD;  Location: [facility] CATH LAB;  RCA CTO PCI with overlapping Promus DES: 3.0 mm x 38 mm, 3.5  mm x 28m  . RIGHT/LEFT HEART CATH AND CORONARY ANGIOGRAPHY N/A 11/04/2019   Procedure: RIGHT/LEFT HEART CATH AND CORONARY ANGIOGRAPHY;  Surgeon: HLeonie Man MD;  [facility] [place];  Service: Cardiovascular;  Laterality: N/A;  . TRANSTHORACIC ECHOCARDIOGRAM  07/04/2018   recurrent Anterior STEMI: EF 35-40%.  Apical hypokinesis.  GRII DD.  No thrombus noted.  (Improved EF from 30% up to 35-40% from previous echo)  . TRANSTHORACIC ECHOCARDIOGRAM  02/14/2019   Echo 02/14/19: Moderate LVH.  EF improved up to 45-50%.  GR 1 DD.  Severe apical akinesis.  Normal valves.  . TRANSTHORACIC ECHOCARDIOGRAM  12/18/2018   TAKOTUSBO CM: EF 20-25%-diffuse HK..  Moderately increased LV thickness.  GRII DD with elevated LVEDP.  Severe LA dilation.  Suspected Takotsubo's cardiomyopathy.  . TUBAL LIGATION  1994    Current Medications: Current Meds  Medication Sig  . atorvastatin (LIPITOR) 40 MG tablet TAKE 2 TABLETS BY MOUTH DAILY  . blood glucose meter kit and supplies Dispense based on patient and insurance preference. Use up to four times daily as directed. (FOR ICD-10 E10.9, E11.9).  .Marland KitchenBlood Glucose Monitoring Suppl (TRUE METRIX METER) DEVI 1 kit by Does not apply route 4 (four) times daily.  .Marland KitchenbuPROPion (WELLBUTRIN SR) 150 MG 12 hr tablet Take 1 tablet (150 mg total) by mouth 2 (two) times daily.  . carvedilol (COREG) 25 MG tablet Take 1 tablet (25 mg total) by mouth 2 (two) times daily.  . furosemide (LASIX) 40 MG tablet Take 20 mg by mouth daily.  .Marland Kitchengabapentin (NEURONTIN) 300 MG capsule Take 1 capsule (300 mg total) by mouth at bedtime.  .Marland KitchenglipiZIDE (GLUCOTROL) 10 MG tablet Take 1 tablet (10 mg total) by mouth 2 (two) times daily before a meal.  . glucose blood test strip Use as instructed  . insulin glargine (LANTUS SOLOSTAR) 100 UNIT/ML Solostar Pen Inject 33 Units into the skin daily.  . nitroGLYCERIN (NITROSTAT) 0.4 MG SL tablet Place 1 tablet (0.4 mg total) under the tongue every 5  (five) minutes as needed for chest pain. Reported on 11/25/2015  . spironolactone (ALDACTONE) 25 MG tablet Take 1 tablet (25 mg total) by mouth 2 (two) times daily.  . ticagrelor (BRILINTA) 60 MG TABS tablet Take 1 tablet (60 mg total) by mouth 2 (two) times daily.  . TRUEPLUS LANCETS 28G MISC 28 g by Does not apply route 4 (four) times daily.  . [DISCONTINUED] carvedilol (COREG) 12.5 MG tablet Take 1 tablet (12.5 mg total) by mouth 2 (two) times daily.  . [DISCONTINUED]  sacubitril-valsartan (ENTRESTO) 49-51 MG Take 1 tablet by mouth 2 (two) times daily.     Allergies:   Patient has no known allergies.   Social History   Socioeconomic History  . Marital status: Single    Spouse name: Not on file  . Number of children: 3  . Years of education: Not on file  . Highest education level: 11th grade  Occupational History    Employer: [employer]  Tobacco Use  . Smoking status: Current Every Day Smoker    Packs/day: 0.00    Years: 22.00    Pack years: 0.00    Types: Cigarettes  . Smokeless tobacco: Never Used  . Tobacco comment: pt states she smokes about 1-2 cigarettes per day--08/01/18  Vaping Use  . Vaping Use: Never used  Substance and Sexual Activity  . Alcohol use: Yes    Alcohol/week: 0.0 standard drinks    Comment: 07/04/2018 "only on special occasions; maybe once/yr"  . Drug use: Yes    Types: Marijuana    Comment: 02/25/2013 "quit marijuana ~ 2001"  . Sexual activity: Not Currently    Birth control/protection: Surgical  Other Topics Concern  . Not on file  Social History Narrative  . Not on file   Social Determinants of Health   Financial Resource Strain:   . Difficulty of Paying Living Expenses:   Food Insecurity:   . Worried About Charity fundraiser in the Last Year:   . Arboriculturist in the Last Year:   Transportation Needs: No Transportation Needs  . Lack of Transportation (Medical): No  . Lack of Transportation (Non-Medical): No  Physical Activity:   .  Days of Exercise per Week:   . Minutes of Exercise per Session:   Stress:   . Feeling of Stress :   Social Connections:   . Frequency of Communication with Friends and Family:   . Frequency of Social Gatherings with Friends and Family:   . Attends Religious Services:   . Active Member of Clubs or Organizations:   . Attends Archivist Meetings:   Marland Kitchen Marital Status:      Family History: The patient's family history includes Diabetes in her mother, sister, sister, son, and son.  ROS:   Please see the history of present illness.     All other systems reviewed and are negative.  EKGs/Labs/Other Studies Reviewed:    The following studies were reviewed today:  Echo 10/31/2019 1. Severe global reduction in LV systolic function; mild LVH; mild LVE;  restrictive filling; mild MR and TR; mild LAE; moderate pulmonary  hypertension.  2. Left ventricular ejection fraction, by estimation, is 20 to 25%. The  left ventricle has severely decreased function. The left ventricle  demonstrates global hypokinesis. The left ventricular internal cavity size  was mildly dilated. There is mild left  ventricular hypertrophy. Left ventricular diastolic parameters are  consistent with Grade III diastolic dysfunction (restrictive).  3. Right ventricular systolic function is normal. The right ventricular  size is normal. There is moderately elevated pulmonary artery systolic  pressure.  4. Left atrial size was mildly dilated.  5. The mitral valve is normal in structure. Mild mitral valve  regurgitation. No evidence of mitral stenosis.  6. The aortic valve is tricuspid. Aortic valve regurgitation is not  visualized. No aortic stenosis is present.  7. The inferior vena cava is normal in size with <50% respiratory  variability, suggesting right atrial pressure of 8 mmHg.  Cath 04/08/2425  LV end diastolic pressure is mildly elevated with only mildly elevated PCWP.  There is no aortic  valve stenosis.  Ost 2nd Diag lesion is 100% stenosed.  Previously placed Prox LAD to Mid LAD stent is 10% stenosed & Mid-distal LAD drug eluting stents are widely patent.  Mid LAD to Dist LAD lesion is 30% stenosed in a tapered fashion distal to the stents.Colon Flattery 2nd Mrg to 2nd Mrg lesion is 55% stenosed.  Prox RCA to Mid RCA stent is 5% stenosed.  Mid RCA to Dist RCA lesion is 40% stenosed.   SUMMARY:  Stable 3 V CAD- widely patent stents in LAD & RCA with known CTO of Diag branch & moderate OM disease.  No culprit lesion to explain reduced LVEF  Relatively normal RHC #s wth only mildly elevated LVEDP and PCWP despite systemic hypertension with blood pressures in the 834H systolic.  RECOMMENDATIONS:  Seems relatively well diuresed, probably still has small amount of volume left to removed.  More important is adequate hypertension management.  Suspect reduced EF is due to hypertensive urgency.    EKG:  EKG is not ordered today.   Recent Labs: 10/31/2019: B Natriuretic Peptide 1,339.2; TSH 1.904 11/03/2019: ALT 12; Magnesium 2.0 11/04/2019: Hemoglobin 11.2; Platelets 350 01/07/2020: BUN 8; Creatinine, Ser 0.85; Potassium 4.0; Sodium 136  Recent Lipid Panel    Component Value Date/Time   CHOL 164 07/05/2018 0330   TRIG 85 12/18/2018 0318   HDL 39 (L) 07/05/2018 0330   CHOLHDL 4.2 07/05/2018 0330   VLDL 15 07/05/2018 0330   LDLCALC 110 (H) 07/05/2018 0330    Physical Exam:    VS:  BP (!) 174/76   Pulse 74   Ht 5' 9"  (1.753 m)   Wt 237 lb 12.8 oz (107.9 kg)   LMP 10/20/2015   SpO2 100%   BMI 35.12 kg/m     Wt Readings from Last 3 Encounters:  01/08/20 237 lb 12.8 oz (107.9 kg)  12/24/19 240 lb (108.9 kg)  11/25/19 241 lb (109.3 kg)     GEN:  Well nourished, well developed in no acute distress HEENT: Normal NECK: No JVD; No carotid bruits LYMPHATICS: No lymphadenopathy CARDIAC: RRR, no murmurs, rubs, gallops RESPIRATORY:  Clear to auscultation without  rales, wheezing or rhonchi  ABDOMEN: Soft, non-tender, non-distended MUSCULOSKELETAL:  No edema; No deformity  SKIN: Warm and dry NEUROLOGIC:  Alert and oriented x 3 PSYCHIATRIC:  Normal affect   ASSESSMENT:    1. Burning chest pain   2. Medication management   3. Chronic combined systolic and diastolic CHF (congestive heart failure) ([place])   4. Coronary artery disease involving native coronary artery of native heart without angina pectoris   5. Hyperlipidemia associated with type 2 diabetes mellitus ([place])   6. Controlled type 2 diabetes mellitus without complication, with long-term current use of insulin (HCC)    PLAN:    In order of problems listed above:  1. Burning chest pain: Suspect noncardiac in nature given the reassuring cardiac catheterization lately, I recommend start on Protonix 40 mg daily  2. Chronic combined systolic and diastolic heart failure: We will further uptitrate Entresto and carvedilol.  Obtain basic metabolic panel in 1 to 2 weeks  3. CAD: Recent cardiac catheterization showed stable anatomy  4. Hyperlipidemia: Continue statin therapy  5. DM2: Managed per primary care provider.   Medication Adjustments/Labs and Tests Ordered: Current medicines are reviewed at length with the patient today.  Concerns regarding  medicines are outlined above.  Orders Placed This Encounter  Procedures  . Basic metabolic panel   Meds ordered this encounter  Medications  . pantoprazole (PROTONIX) 40 MG tablet    Sig: Take 1 tablet (40 mg total) by mouth daily.    Dispense:  30 tablet    Refill:  11  . carvedilol (COREG) 25 MG tablet    Sig: Take 1 tablet (25 mg total) by mouth 2 (two) times daily.    Dispense:  180 tablet    Refill:  3  . sacubitril-valsartan (ENTRESTO) 97-103 MG    Sig: Take 1 tablet by mouth 2 (two) times daily.    Dispense:  180 tablet    Refill:  0    Patient Instructions  Medication Instructions:   START Protonix 40 mg daily  INCREASE   Carvedilol to 25 mg 2 times a day  INCREASE Entresto to 97-103 2 times a day  *If you need a refill on your cardiac medications before your next appointment, please call your pharmacy*  Lab Work: Your physician recommends that you return for lab work in [place]:   BMET If you have labs (blood work) drawn today and your tests are completely normal, you will receive your results only by: Marland Kitchen MyChart Message (if you have MyChart) OR . A paper copy in the mail If you have any lab test that is abnormal or we need to change your treatment, we will call you to review the results.  Testing/Procedures: NONE ordered at this time of appointment   Follow-Up: At [facility], you and your health needs are our priority.  As part of our continuing mission to provide you with exceptional heart care, we have created designated Provider Care Teams.  These Care Teams include your primary Cardiologist (physician) and Advanced Practice Providers (APPs -  Physician Assistants and Nurse Practitioners) who all work together to provide you with the care you need, when you need it.  We recommend signing up for the patient portal called "MyChart".  Sign up information is provided on this After Visit Summary.  MyChart is used to connect with patients for Virtual Visits (Telemedicine).  Patients are able to view lab/test results, encounter notes, upcoming appointments, etc.  Non-urgent messages can be sent to your provider as well.   To learn more about what you can do with MyChart, go to [email or web address].    Your next appointment:   1 month(s)  The format for your next appointment:   In Person  Provider:   Almyra Deforest, PA-C  Other Instructions      Signed, Almyra Deforest, [place]  01/11/2020 12:00 AM    [place]

## 2020-01-08 NOTE — Patient Instructions (Addendum)
Medication Instructions:   START Protonix 40 mg daily  INCREASE  Carvedilol to 25 mg 2 times a day  INCREASE Entresto to 97-103 2 times a day  *If you need a refill on your cardiac medications before your next appointment, please call your pharmacy*  Lab Work: Your physician recommends that you return for lab work in [place]:   BMET If you have labs (blood work) drawn today and your tests are completely normal, you will receive your results only by: Marland Kitchen MyChart Message (if you have MyChart) OR . A paper copy in the mail If you have any lab test that is abnormal or we need to change your treatment, we will call you to review the results.  Testing/Procedures: NONE ordered at this time of appointment   Follow-Up: At [facility], you and your health needs are our priority.  As part of our continuing mission to provide you with exceptional heart care, we have created designated Provider Care Teams.  These Care Teams include your primary Cardiologist (physician) and Advanced Practice Providers (APPs -  Physician Assistants and Nurse Practitioners) who all work together to provide you with the care you need, when you need it.  We recommend signing up for the patient portal called "MyChart".  Sign up information is provided on this After Visit Summary.  MyChart is used to connect with patients for Virtual Visits (Telemedicine).  Patients are able to view lab/test results, encounter notes, upcoming appointments, etc.  Non-urgent messages can be sent to your provider as well.   To learn more about what you can do with MyChart, go to [email or web address].    Your next appointment:   1 month(s)  The format for your next appointment:   In Person  Provider:   Almyra Deforest, PA-C  Other Instructions

## 2020-01-09 MED FILL — ?PANTOPRAZOLE SO DR 40MG TA: 40 | 30 days supply | Qty: 30 | Fill #0

## 2020-01-10 ENCOUNTER — Encounter: Payer: Self-pay | Admitting: Physician Assistant

## 2020-01-16 ENCOUNTER — Encounter: Payer: Self-pay | Admitting: *Deleted

## 2020-01-16 LAB — BASIC METABOLIC PANEL
BUN/Creatinine Ratio: 11 (ref 9–23)
BUN: 10 mg/dL (ref 6–24)
CO2: 24 mmol/L (ref 20–29)
Calcium: 9.1 mg/dL (ref 8.7–10.2)
Chloride: 100 mmol/L (ref 96–106)
Creatinine, Ser: 0.91 mg/dL (ref 0.57–1.00)
GFR calc Af Amer: 85 mL/min/{1.73_m2} (ref >59)
GFR calc non Af Amer: 74 mL/min/{1.73_m2} (ref >59)
Glucose: 407 mg/dL — ABNORMAL HIGH (ref 65–99)
Potassium: 4.1 mmol/L (ref 3.5–5.2)
Sodium: 138 mmol/L (ref 134–144)

## 2020-01-16 NOTE — Progress Notes (Signed)
Stable renal function and electrolyte

## 2020-01-26 ENCOUNTER — Other Ambulatory Visit: Payer: Self-pay | Admitting: Family Medicine

## 2020-01-26 MED FILL — ?SPIRONOLACTONE 25 MG TABLE: 25 | 30 days supply | Qty: 60 | Fill #1

## 2020-01-26 NOTE — Telephone Encounter (Signed)
The patient is scheduled for 02/10/20 and the appt was made on 01/08/20

## 2020-02-10 ENCOUNTER — Other Ambulatory Visit: Payer: Self-pay | Admitting: Physician Assistant

## 2020-02-10 ENCOUNTER — Other Ambulatory Visit: Payer: Self-pay

## 2020-02-10 ENCOUNTER — Ambulatory Visit (INDEPENDENT_AMBULATORY_CARE_PROVIDER_SITE_OTHER): Payer: Self-pay | Admitting: Physician Assistant

## 2020-02-10 ENCOUNTER — Encounter: Payer: Self-pay | Admitting: Physician Assistant

## 2020-02-10 VITALS — BP 173/80 | HR 79 | Ht 69.0 in | Wt 240.6 lb

## 2020-02-10 DIAGNOSIS — I2510 Atherosclerotic heart disease of native coronary artery without angina pectoris: Secondary | ICD-10-CM

## 2020-02-10 DIAGNOSIS — I255 Ischemic cardiomyopathy: Secondary | ICD-10-CM

## 2020-02-10 DIAGNOSIS — E785 Hyperlipidemia, unspecified: Secondary | ICD-10-CM

## 2020-02-10 DIAGNOSIS — E119 Type 2 diabetes mellitus without complications: Secondary | ICD-10-CM

## 2020-02-10 MED ORDER — HYDRALAZINE HCL 50 MG PO TABS: 50.0000 mg | ORAL_TABLET | Freq: Two times a day (BID) | ORAL | 6 refills | Status: DC

## 2020-02-10 MED ORDER — FUROSEMIDE 40 MG PO TABS: 20.0000 mg | ORAL_TABLET | ORAL | Status: DC | PRN

## 2020-02-10 NOTE — Patient Instructions (Signed)
Medication Instructions:  START HYDRALAZINE 50MG  TWICE DAILY  TAKE FUROSEMIDE (LASIX) ONLY AS NEEDED FOR SWELLING *If you need a refill on your cardiac medications before your next appointment, please call your pharmacy*  Special Instructions TAKE BLOOD PRESSURE TWICE DAILY 2 HOURS AFTER TAKING MEDICATION AND AT A FIXED TIME IN THE AFTERNOON-EVERY DAY AT THE SAME TIME.  Follow-Up: Your next appointment:  1 month(s) WITH HAO MENG PA-C AND 3 MONTHS WITH DR HARDING.  At [facility], you and your health needs are our priority.  As part of our continuing mission to provide you with exceptional heart care, we have created designated Provider Care Teams.  These Care Teams include your primary Cardiologist (physician) and Advanced Practice Providers (APPs -  Physician Assistants and Nurse Practitioners) who all work together to provide you with the care you need, when you need it.  We recommend signing up for the patient portal called "MyChart".  Sign up information is provided on this After Visit Summary.  MyChart is used to connect with patients for Virtual Visits (Telemedicine).  Patients are able to view lab/test results, encounter notes, upcoming appointments, etc.  Non-urgent messages can be sent to your provider as well.   To learn more about what you can do with MyChart, go to [email or web address].

## 2020-02-10 NOTE — Progress Notes (Signed)
Cardiology Office Note:    Date:  02/13/2020   ID:  Amy Chaney, DOB 26-Mar-1971, MRN [id number]  PCP:  Amy Rakes, MD  [facility] Cardiologist:  Amy Hew, MD  [place] Electrophysiologist:  None   Referring MD: Amy Rakes, MD   No chief complaint on file.   History of Present Illness:    Amy Chaney is a 49 y.o. female with a hx of CAD, hyperlipidemia, Takotsubo cardiomyopathy, ischemic cardiomyopathy, DM2 and tobacco abuse.  He underwent PCI of RCA on 2 separate occasions in 2014 and PCI/DES x3 to diagonal/LAD in 09/2017.  Procedure was complicated by the loss of second diagonal despite attempted stent placement secondary to dissection and inability to rewire. Ejection fraction in March 2019 was 35%. Patient had a distal stent thrombosis of LAD in December 2019 and underwent repeat DES that overlapped with previously placed stents.  She was admitted in May 2020 with acute heart failure.  Ejection fraction at the time was down to 20 to 25% by echo which was thought to be Takotsubo cardiomyopathy related to her son's death.  Last echocardiogram obtained on 02/14/2019 showed ejection fraction has improved to 45-50%.  She was last seen by Dr. Ellyn Hack in November 2020 at which time she was doing well, she did have mild swelling however largely controlled using diuretic.  More recently, patient was admitted in April with acute on chronic combined systolic and diastolic heart failure.  She ultimately underwent left and right heart cath due to worsening LV function.  Cardiac catheterization showed patent stents, chronically occluded diagonal branch of LAD.  She was discharged on heart failure medication including carvedilol, Entresto and spironolactone.  When she was seen back for follow-up, her Entresto to increased to 49-51 mg twice daily.  During the last follow-up on 5/26, her spironolactone was increased to 25 mg twice daily, carvedilol increased to 12.5 mg twice  daily.  It was suspected she was not adhering to the low-dose sodium diet which causes her blood pressure to be very elevated.  Patient presents today for follow-up, despite the recent increase of Entresto and carvedilol, her blood pressure remains elevated.  She says she is compliant with her medication.  She is on maximum dose of carvedilol, Entresto and spironolactone.  I will add hydralazine 50 mg twice daily to her medical regimen.  I have emphasized on the importance of blood pressure control to help her improve ejection fraction over time.  Once her blood pressure is under better control, then we can consider a repeat echocardiogram in the future.   Past Medical History:  Diagnosis Date  . Coronary artery disease involving native coronary artery of native heart with angina pectoris ([place]) 01/2013   a. s/p PCI of the mid RCA 01/2013 //  b. LHC 9/14: EF 55%, RCA stent 100% occluded, LAD irregularities >>  subacute stent thrombosis, Promus DES PCI distal overlap // c) 09/2017 - Ant STEMI - LAD-D2 PCI - Successful PCI of LAD (3 overlapping DES), unable to restore flow down D2l that was stented as well.  Likely related to downstream dissection, but unable to rewire.  . Daily headache   . Depression   . Dyslipidemia, goal LDL below 70   . History of Doppler ultrasound    carotid bruit >> a. Carotid US 6/17: bilat ICA 1-39%  . History of Takotsubo cardiomyopathy 11/2018   Admitted for acute on chronic combined systolic and diastolic heart failure.  EF down to 20-25% global hypokinesis.  Was in the setting of grieving over the loss of her son from MI.  ->  EF improved to 45-50% on follow-up echocardiogram.  . Hypertension   . Ischemic cardiomyopathy 06/2018   s/p inferior STEMI followed by 2 anterior STEMIs: Following recovery from Takotsubo Cardiomyopathy- Echo 02/14/19: Moderate LVH.  EF improved up to 45-50%.  GR 1 DD.  Severe apical akinesis.  Normal valves.  . Mild dysplasia of cervix (CIN I)  02/04/2019   Seen after colposcopy on 01/23/19 done for ASCU +HPV pap  > repeat pap and HPV test in 12 and 24 months  . STEMI involving left anterior descending coronary artery ([place]) 09/2017; 06/2018   a) Severe [place] 1, 1, 1 LAD-D2 lesion (complicated by post PTCA dissection/intramural hematoma) -successful extensive PCI of the LAD but unable to maintain patency of the stented D2. b) distal mLAD stent Edge 100% thrombosis --> PTCA & Overlapping DES PCI (Synergy 3 x 12 --> 3.6-3.3 mm). D2 occluded. RCA stents patent, 65 % OM2. EF 30-35%.  Marland Kitchen STEMI involving oth coronary artery of inferior wall ([place]) 03/2013   Secondary to subacute stent thrombosis of RCA stent segment having missed 4 doses of Effient.  . Tobacco abuse 01/11/2016  . Type II diabetes mellitus ([place]) 12/2010   sister and son also diabetic     Past Surgical History:  Procedure Laterality Date  . CHOLECYSTECTOMY  ~ 2000  . CORONARY STENT INTERVENTION N/A 10/10/2017   Procedure: CORONARY STENT INTERVENTION;  Surgeon: Leonie Man, MD;  Location: [facility];  Service: Cardiovascular; LAD PCI: STENT SYNERGY DES 3.5X32 (p),STENT SYNERGY DES 3.5X 8 (m), STENT SYNERGY DES 3.5X28 (d).  D2 PCI: STENT SYNERGY DES 2.5X24.  (UNABL TO REWIRE & EXPAND - TO OF DIAG AT END OF CASE)  . CORONARY STENT INTERVENTION N/A 07/03/2018   Procedure: CORONARY STENT INTERVENTION;  Surgeon: Leonie Man, MD;  Location: [facility];; PTCA & overlapping DES PCI (overlaps prior stents distally) - Synergy DES 3 x 12 (3.6 mm @ overlap -- 3.3 mm distally)>  . LEFT HEART CATH AND CORONARY ANGIOGRAPHY N/A 10/10/2017   Procedure: LEFT HEART CATH AND CORONARY ANGIOGRAPHY;  Surgeon: Leonie Man, MD;  Location: [facility] [facility] [facility];  Service: Cardiovascular;  Laterality: N/A; -patent RCA stents with mild in-stent restenosis. [place] 1,1,1 mLD-D2 41% (complicated by dissection/intramural thrombus)  . LEFT HEART CATH AND CORONARY ANGIOGRAPHY N/A 07/03/2018    Procedure: LEFT HEART CATH AND CORONARY ANGIOGRAPHY;  Surgeon: Leonie Man, MD;  Location: [place] [facility];; 100% thrombotic occlusion distal LAD stent -- DES PCI. continued 100% D2 (previously lost). patent RCA stents, 65% OM2.  EF 30-35%.  Marland Kitchen LEFT HEART CATHETERIZATION WITH CORONARY ANGIOGRAM N/A 02/25/2013   Procedure: LEFT HEART CATHETERIZATION WITH CORONARY ANGIOGRAM;  Surgeon: Laverda Page, MD;  Location: [facility] C[facility] LAB;  Service: Cardiovascular;; CTO m RCA; otherwise normal coronaries  . LEFT HEART CATHETERIZATION WITH CORONARY ANGIOGRAM N/A 04/01/2013   Procedure: LEFT HEART CATHETERIZATION WITH CORONARY ANGIOGRAM;  Surgeon: Laverda Page, MD;  Location: [facility] C[facility] LAB;  Service: Cardiovascular: 100% occlusion of distal RCA stent (subacute stent thrombosis -  secondary to stopping Effient).  Overlapping DES PCI  . NM MYOVIEW LTD  12/2015    EF 38%.  Hypertensive response to exercise (231/132 mmHg).  INTERMEDIATE RISK due to -diffuse hypokinesis and reduced EF.  No ischemia or infarction.  Marland Kitchen PERCUTANEOUS CORONARY STENT INTERVENTION (PCI-S)  04/01/2013   Procedure: PERCUTANEOUS  CORONARY STENT INTERVENTION (PCI-S);  Surgeon: Laverda Page, MD;  Location: [facility] C[facility] LAB;  Service: Cardiovascular;; PCI of distal RCA stent subacute thrombosis: Promus Premier DES 3.0 mm x 20 mm.  Marland Kitchen PERCUTANEOUS CORONARY STENT INTERVENTION (PCI-S)  02/25/2013   Procedure: PERCUTANEOUS CORONARY STENT INTERVENTION (PCI-S);  Surgeon: Laverda Page, MD;  Location: [facility] CATH LAB;  RCA CTO PCI with overlapping Promus DES: 3.0 mm x 38 mm, 3.5 mm x 15m  . RIGHT/LEFT HEART CATH AND CORONARY ANGIOGRAPHY N/A 11/04/2019   Procedure: RIGHT/LEFT HEART CATH AND CORONARY ANGIOGRAPHY;  Surgeon: HLeonie Man MD;  [facility];  Service: Cardiovascular;  Laterality: N/A;  . TRANSTHORACIC ECHOCARDIOGRAM  07/04/2018   recurrent Anterior STEMI: EF 35-40%.  Apical hypokinesis.  GRII DD.  No thrombus noted.   (Improved EF from 30% up to 35-40% from previous echo)  . TRANSTHORACIC ECHOCARDIOGRAM  02/14/2019   Echo 02/14/19: Moderate LVH.  EF improved up to 45-50%.  GR 1 DD.  Severe apical akinesis.  Normal valves.  . TRANSTHORACIC ECHOCARDIOGRAM  12/18/2018   TAKOTUSBO CM: EF 20-25%-diffuse HK..  Moderately increased LV thickness.  GRII DD with elevated LVEDP.  Severe LA dilation.  Suspected Takotsubo's cardiomyopathy.  . TUBAL LIGATION  1994    Current Medications: Current Meds  Medication Sig  . atorvastatin (LIPITOR) 40 MG tablet TAKE 2 TABLETS BY MOUTH DAILY  . blood glucose meter kit and supplies Dispense based on patient and insurance preference. Use up to four times daily as directed. (FOR ICD-10 E10.9, E11.9).  .Marland KitchenBlood Glucose Monitoring Suppl (TRUE METRIX METER) DEVI 1 kit by Does not apply route 4 (four) times daily.  .Marland KitchenbuPROPion (WELLBUTRIN SR) 150 MG 12 hr tablet Take 1 tablet (150 mg total) by mouth 2 (two) times daily.  . carvedilol (COREG) 25 MG tablet Take 1 tablet (25 mg total) by mouth 2 (two) times daily.  . furosemide (LASIX) 40 MG tablet Take 0.5 tablets (20 mg total) by mouth as needed (swelling).  . gabapentin (NEURONTIN) 300 MG capsule Take 1 capsule (300 mg total) by mouth at bedtime.  .Marland KitchenglipiZIDE (GLUCOTROL) 10 MG tablet Take 1 tablet (10 mg total) by mouth 2 (two) times daily before a meal.  . glucose blood test strip Use as instructed  . insulin glargine (LANTUS SOLOSTAR) 100 UNIT/ML Solostar Pen Inject 33 Units into the skin daily.  . nitroGLYCERIN (NITROSTAT) 0.4 MG SL tablet Place 1 tablet (0.4 mg total) under the tongue every 5 (five) minutes as needed for chest pain. Reported on 11/25/2015  . pantoprazole (PROTONIX) 40 MG tablet Take 1 tablet (40 mg total) by mouth daily.  . sacubitril-valsartan (ENTRESTO) 97-103 MG Take 1 tablet by mouth 2 (two) times daily.  .Marland Kitchenspironolactone (ALDACTONE) 25 MG tablet Take 1 tablet (25 mg total) by mouth 2 (two) times daily.  .  ticagrelor (BRILINTA) 60 MG TABS tablet Take 1 tablet (60 mg total) by mouth 2 (two) times daily.  . TRUEPLUS LANCETS 28G MISC 28 g by Does not apply route 4 (four) times daily.  . [DISCONTINUED] furosemide (LASIX) 40 MG tablet Take 20 mg by mouth daily.     Allergies:   Patient has no known allergies.   Social History   Socioeconomic History  . Marital status: Single    Spouse name: Not on file  . Number of children: 3  . Years of education: Not on file  . Highest education level: 11th grade  Occupational History  Employer: [employer]  Tobacco Use  . Smoking status: Current Every Day Smoker    Packs/day: 0.00    Years: 22.00    Pack years: 0.00    Types: Cigarettes  . Smokeless tobacco: Never Used  . Tobacco comment: pt states she smokes about 1-2 cigarettes per day--08/01/18  Vaping Use  . Vaping Use: Never used  Substance and Sexual Activity  . Alcohol use: Yes    Alcohol/week: 0.0 standard drinks    Comment: 07/04/2018 "only on special occasions; maybe once/yr"  . Drug use: Yes    Types: Marijuana    Comment: 02/25/2013 "quit marijuana ~ 2001"  . Sexual activity: Not Currently    Birth control/protection: Surgical  Other Topics Concern  . Not on file  Social History Narrative  . Not on file   Social Determinants of Health   Financial Resource Strain:   . Difficulty of Paying Living Expenses:   Food Insecurity:   . Worried About Charity fundraiser in the Last Year:   . Arboriculturist in the Last Year:   Transportation Needs:   . Film/video editor (Medical):   Marland Kitchen Lack of Transportation (Non-Medical):   Physical Activity:   . Days of Exercise per Week:   . Minutes of Exercise per Session:   Stress:   . Feeling of Stress :   Social Connections:   . Frequency of Communication with Friends and Family:   . Frequency of Social Gatherings with Friends and Family:   . Attends Religious Services:   . Active Member of Clubs or Organizations:   . Attends  Archivist Meetings:   Marland Kitchen Marital Status:      Family History: The patient's family history includes Diabetes in her mother, sister, sister, son, and son.  ROS:   Please see the history of present illness.     All other systems reviewed and are negative.  EKGs/Labs/Other Studies Reviewed:    The following studies were reviewed today:  Echo 10/31/2019 1. Severe global reduction in LV systolic function; mild LVH; mild LVE;  restrictive filling; mild MR and TR; mild LAE; moderate pulmonary  hypertension.  2. Left ventricular ejection fraction, by estimation, is 20 to 25%. The  left ventricle has severely decreased function. The left ventricle  demonstrates global hypokinesis. The left ventricular internal cavity size  was mildly dilated. There is mild left  ventricular hypertrophy. Left ventricular diastolic parameters are  consistent with Grade III diastolic dysfunction (restrictive).  3. Right ventricular systolic function is normal. The right ventricular  size is normal. There is moderately elevated pulmonary artery systolic  pressure.  4. Left atrial size was mildly dilated.  5. The mitral valve is normal in structure. Mild mitral valve  regurgitation. No evidence of mitral stenosis.  6. The aortic valve is tricuspid. Aortic valve regurgitation is not  visualized. No aortic stenosis is present.  7. The inferior vena cava is normal in size with <50% respiratory  variability, suggesting right atrial pressure of 8 mmHg.    Cath 09/28/5398  LV end diastolic pressure is mildly elevated with only mildly elevated PCWP.  There is no aortic valve stenosis.  Ost 2nd Diag lesion is 100% stenosed.  Previously placed Prox LAD to Mid LAD stent is 10% stenosed & Mid-distal LAD drug eluting stents are widely patent.  Mid LAD to Dist LAD lesion is 30% stenosed in a tapered fashion distal to the stents.Colon Flattery 2nd Mrg to 2nd  Mrg lesion is 55% stenosed.  Prox RCA to Mid RCA  stent is 5% stenosed.  Mid RCA to Dist RCA lesion is 40% stenosed.   SUMMARY:  Stable 3 V CAD- widely patent stents in LAD & RCA with known CTO of Diag branch & moderate OM disease.  No culprit lesion to explain reduced LVEF  Relatively normal RHC #s wth only mildly elevated LVEDP and PCWP despite systemic hypertension with blood pressures in the 935T systolic.  RECOMMENDATIONS:  Seems relatively well diuresed, probably still has small amount of volume left to removed.  More important is adequate hypertension management.  Suspect reduced EF is due to hypertensive urgency.    EKG:  EKG is not ordered today.    Recent Labs: 10/31/2019: B Natriuretic Peptide 1,339.2; TSH 1.904 11/03/2019: ALT 12; Magnesium 2.0 11/04/2019: Hemoglobin 11.2; Platelets 350 01/15/2020: BUN 10; Creatinine, Ser 0.91; Potassium 4.1; Sodium 138  Recent Lipid Panel    Component Value Date/Time   CHOL 164 07/05/2018 0330   TRIG 85 12/18/2018 0318   HDL 39 (L) 07/05/2018 0330   CHOLHDL 4.2 07/05/2018 0330   VLDL 15 07/05/2018 0330   LDLCALC 110 (H) 07/05/2018 0330    Physical Exam:    VS:  BP (!) 173/80   Pulse 79   Ht 5' 9"  (1.753 m)   Wt 240 lb 9.6 oz (109.1 kg)   LMP 10/20/2015   SpO2 98%   BMI 35.53 kg/m     Wt Readings from Last 3 Encounters:  02/10/20 240 lb 9.6 oz (109.1 kg)  01/08/20 237 lb 12.8 oz (107.9 kg)  12/24/19 240 lb (108.9 kg)     GEN:  Well nourished, well developed in no acute distress HEENT: Normal NECK: No JVD; No carotid bruits LYMPHATICS: No lymphadenopathy CARDIAC: RRR, no murmurs, rubs, gallops RESPIRATORY:  Clear to auscultation without rales, wheezing or rhonchi  ABDOMEN: Soft, non-tender, non-distended MUSCULOSKELETAL:  No edema; No deformity  SKIN: Warm and dry NEUROLOGIC:  Alert and oriented x 3 PSYCHIATRIC:  Normal affect   ASSESSMENT:    1. Coronary artery disease involving native coronary artery of native heart without angina pectoris   2.  Hyperlipidemia LDL goal <70   3. Ischemic cardiomyopathy   4. Controlled type 2 diabetes mellitus without complication, without long-term current use of insulin (HCC)    PLAN:    In order of problems listed above:  1. CAD: Recent cardiac catheterization showed stable anatomy  2. Hypertension: Blood pressure uncontrolled.  We will start on hydralazine 50 mg twice a day.  I will see the patient back in 1 month to further titrate blood pressure medication.  3. Hyperlipidemia: Continue Lipitor  4. Ischemic cardiomyopathy: Recent cardiac catheterization showed stable anatomy, the drop in the ejection fraction is likely related to uncontrolled high blood pressure.  We will need to focus on blood pressure control before consider repeat echocardiogram  5. DM2: Managed by primary care provider   Medication Adjustments/Labs and Tests Ordered: Current medicines are reviewed at length with the patient today.  Concerns regarding medicines are outlined above.  No orders of the defined types were placed in this encounter.  Meds ordered this encounter  Medications  . furosemide (LASIX) 40 MG tablet    Sig: Take 0.5 tablets (20 mg total) by mouth as needed (swelling).    Dispense:  30 tablet  . hydrALAZINE (APRESOLINE) 50 MG tablet    Sig: Take 1 tablet (50 mg total) by mouth 2 (two) times  daily.    Dispense:  60 tablet    Refill:  6    Patient Instructions  Medication Instructions:  START HYDRALAZINE 50MG TWICE DAILY  TAKE FUROSEMIDE (LASIX) ONLY AS NEEDED FOR SWELLING *If you need a refill on your cardiac medications before your next appointment, please call your pharmacy*  Special Instructions TAKE BLOOD PRESSURE TWICE DAILY 2 HOURS AFTER TAKING MEDICATION AND AT A FIXED TIME IN THE AFTERNOON-EVERY DAY AT THE SAME TIME.  Follow-Up: Your next appointment:  1 month(s) WITH Carmilla Granville PA-C AND 3 MONTHS WITH DR HARDING.  At [facility], you and your health needs are our priority.  As  part of our continuing mission to provide you with exceptional heart care, we have created designated Provider Care Teams.  These Care Teams include your primary Cardiologist (physician) and Advanced Practice Providers (APPs -  Physician Assistants and Nurse Practitioners) who all work together to provide you with the care you need, when you need it.  We recommend signing up for the patient portal called "MyChart".  Sign up information is provided on this After Visit Summary.  MyChart is used to connect with patients for Virtual Visits (Telemedicine).  Patients are able to view lab/test results, encounter notes, upcoming appointments, etc.  Non-urgent messages can be sent to your provider as well.   To learn more about what you can do with MyChart, go to [email or web address].       Hilbert Corrigan, [place]  02/13/2020 12:03 AM    [facility]

## 2020-02-11 MED FILL — hydrALAZINE HCL 50 MG TABS: 50 | 30 days supply | Qty: 60 | Fill #0

## 2020-02-15 ENCOUNTER — Other Ambulatory Visit: Payer: Self-pay

## 2020-02-15 ENCOUNTER — Emergency Department (HOSPITAL_COMMUNITY): Payer: Self-pay

## 2020-02-15 ENCOUNTER — Emergency Department (HOSPITAL_COMMUNITY)
Admission: EM | Admit: 2020-02-15 | Discharge: 2020-02-15 | Disposition: A | Discharge status: Home / Self Care | Payer: Self-pay | Attending: Emergency Medicine | Admitting: Emergency Medicine

## 2020-02-15 ENCOUNTER — Encounter (HOSPITAL_COMMUNITY): Payer: Self-pay | Admitting: Emergency Medicine

## 2020-02-15 DIAGNOSIS — Z79899 Other long term (current) drug therapy: Secondary | ICD-10-CM | POA: Insufficient documentation

## 2020-02-15 DIAGNOSIS — I509 Heart failure, unspecified: Secondary | ICD-10-CM

## 2020-02-15 DIAGNOSIS — E119 Type 2 diabetes mellitus without complications: Secondary | ICD-10-CM | POA: Insufficient documentation

## 2020-02-15 DIAGNOSIS — R519 Headache, unspecified: Secondary | ICD-10-CM | POA: Insufficient documentation

## 2020-02-15 DIAGNOSIS — F17210 Nicotine dependence, cigarettes, uncomplicated: Secondary | ICD-10-CM | POA: Insufficient documentation

## 2020-02-15 DIAGNOSIS — Z794 Long term (current) use of insulin: Secondary | ICD-10-CM | POA: Insufficient documentation

## 2020-02-15 DIAGNOSIS — R0602 Shortness of breath: Secondary | ICD-10-CM | POA: Insufficient documentation

## 2020-02-15 DIAGNOSIS — I10 Essential (primary) hypertension: Secondary | ICD-10-CM | POA: Insufficient documentation

## 2020-02-15 DIAGNOSIS — Z955 Presence of coronary angioplasty implant and graft: Secondary | ICD-10-CM | POA: Insufficient documentation

## 2020-02-15 DIAGNOSIS — F1590 Other stimulant use, unspecified, uncomplicated: Secondary | ICD-10-CM | POA: Insufficient documentation

## 2020-02-15 LAB — CBC
HCT: 34 % — ABNORMAL LOW (ref 36.0–46.0)
Hemoglobin: 11.1 g/dL — ABNORMAL LOW (ref 12.0–15.0)
MCH: 27.1 pg (ref 26.0–34.0)
MCHC: 32.6 g/dL (ref 30.0–36.0)
MCV: 82.9 fL (ref 80.0–100.0)
Platelets: 347 10*3/uL (ref 150–400)
RBC: 4.1 MIL/uL (ref 3.87–5.11)
RDW: 14.9 % (ref 11.5–15.5)
WBC: 11.3 10*3/uL — ABNORMAL HIGH (ref 4.0–10.5)
nRBC: 0 % (ref 0.0–0.2)

## 2020-02-15 LAB — BASIC METABOLIC PANEL
Anion gap: 11 (ref 5–15)
BUN: 9 mg/dL (ref 6–20)
CO2: 22 mmol/L (ref 22–32)
Calcium: 9.3 mg/dL (ref 8.9–10.3)
Chloride: 105 mmol/L (ref 98–111)
Creatinine, Ser: 0.89 mg/dL (ref 0.44–1.00)
GFR calc Af Amer: >60 mL/min (ref >60)
GFR calc non Af Amer: >60 mL/min (ref >60)
Glucose, Bld: 374 mg/dL — ABNORMAL HIGH (ref 70–99)
Potassium: 3.6 mmol/L (ref 3.5–5.1)
Sodium: 138 mmol/L (ref 135–145)

## 2020-02-15 LAB — TROPONIN I (HIGH SENSITIVITY)
Troponin I (High Sensitivity): 8 ng/L (ref <18)
Troponin I (High Sensitivity): 7 ng/L (ref <18)

## 2020-02-15 LAB — BRAIN NATRIURETIC PEPTIDE: B Natriuretic Peptide: 635.5 pg/mL — ABNORMAL HIGH (ref 0.0–100.0)

## 2020-02-15 MED ORDER — FUROSEMIDE 20 MG PO TABS
20.0000 mg | ORAL_TABLET | Freq: Every day | ORAL | 0 refills | Status: DC
Start: 2020-02-15 — End: 2020-06-01

## 2020-02-15 MED ORDER — FUROSEMIDE 10 MG/ML IJ SOLN
40.0000 mg | Freq: Once | INTRAMUSCULAR | Status: AC
  Administered 2020-02-15: 40 mg via INTRAVENOUS
  Filled 2020-02-15: qty 4

## 2020-02-15 MED ORDER — KETOROLAC TROMETHAMINE 30 MG/ML IJ SOLN
30.0000 mg | Freq: Once | INTRAMUSCULAR | Status: AC
  Administered 2020-02-15: 30 mg via INTRAVENOUS
  Filled 2020-02-15: qty 1

## 2020-02-15 MED ORDER — SODIUM CHLORIDE 0.9% FLUSH: 3.0000 mL | Freq: Once | INTRAVENOUS | Status: DC

## 2020-02-15 NOTE — Discharge Instructions (Signed)
Begin taking Lasix 20 mg once daily.  Follow-up with your primary doctor later this week for recheck, and return to the ER if symptoms significantly worsen or change.

## 2020-02-15 NOTE — ED Triage Notes (Signed)
Pt here from home with c/o sob and h/a since Thursday , pt recently started on hctz and thinks that may have something to do with it

## 2020-02-15 NOTE — ED Provider Notes (Signed)
[facility] Provider Note   CSN: [id number] Arrival date & time: 02/15/20  0441     History No chief complaint on file.   Amy [facility] is a 49 y.o. female.  Patient is a 49 year old female with history of coronary artery disease with multiple stents and ischemic cardiomyopathy with EF of 20 to 25%, diabetes, hypertension.  She presents today for evaluation of shortness of breath and headache.  She reports a several day history of dyspnea on exertion and orthopnea.  She is having difficulty sleeping at night due to coughing and feeling short of breath.  She denies any fevers or chills.  She does report some swelling in her legs that is worse when she stands and improves somewhat when she lies flat.  Patient tells me that her Lasix was stopped at a doctor's appointment approximately 3 weeks ago, for what reason she does not know.  The history is provided by the patient.       Past Medical History:  Diagnosis Date  . Coronary artery disease involving native coronary artery of native heart with angina pectoris ([place]) 01/2013   a. s/p PCI of the mid RCA 01/2013 //  b. LHC 9/14: EF 55%, RCA stent 100% occluded, LAD irregularities >>  subacute stent thrombosis, Promus DES PCI distal overlap // c) 09/2017 - Ant STEMI - LAD-D2 PCI - Successful PCI of LAD (3 overlapping DES), unable to restore flow down D2l that was stented as well.  Likely related to downstream dissection, but unable to rewire.  . Daily headache   . Depression   . Dyslipidemia, goal LDL below 70   . History of Doppler ultrasound    carotid bruit >> a. Carotid US 6/17: bilat ICA 1-39%  . History of Takotsubo cardiomyopathy 11/2018   Admitted for acute on chronic combined systolic and diastolic heart failure.  EF down to 20-25% global hypokinesis.  Was in the setting of grieving over the loss of her son from MI.  ->  EF improved to 45-50% on follow-up echocardiogram.  . Hypertension   .  Ischemic cardiomyopathy 06/2018   s/p inferior STEMI followed by 2 anterior STEMIs: Following recovery from Takotsubo Cardiomyopathy- Echo 02/14/19: Moderate LVH.  EF improved up to 45-50%.  GR 1 DD.  Severe apical akinesis.  Normal valves.  . Mild dysplasia of cervix (CIN I) 02/04/2019   Seen after colposcopy on 01/23/19 done for ASCU +HPV pap  > repeat pap and HPV test in 12 and 24 months  . STEMI involving left anterior descending coronary artery ([place]) 09/2017; 06/2018   a) Severe [place] 1, 1, 1 LAD-D2 lesion (complicated by post PTCA dissection/intramural hematoma) -successful extensive PCI of the LAD but unable to maintain patency of the stented D2. b) distal mLAD stent Edge 100% thrombosis --> PTCA & Overlapping DES PCI (Synergy 3 x 12 --> 3.6-3.3 mm). D2 occluded. RCA stents patent, 65 % OM2. EF 30-35%.  Marland Kitchen STEMI involving oth coronary artery of inferior wall ([place]) 03/2013   Secondary to subacute stent thrombosis of RCA stent segment having missed 4 doses of Effient.  . Tobacco abuse 01/11/2016  . Type II diabetes mellitus ([place]) 12/2010   sister and son also diabetic     Patient Active Problem List   Diagnosis Date Noted  . Trichomoniasis 03/30/2019  . Mild dysplasia of cervix (CIN I) 02/04/2019  . Screening breast examination 01/16/2019  . Acute respiratory failure with hypoxia ([place])   . Flash  pulmonary edema ([place])   . Pulmonary edema 12/18/2018  . Atypical squamous cell changes of undetermined significance (ASCUS) on cervical cytology with positive high risk human papilloma virus (HPV) 11/21/2018  . Acute on chronic combined systolic and diastolic CHF (congestive heart failure) ([place]) 07/05/2018  . Dyslipidemia, goal LDL below 70   . Acute ST elevation myocardial infarction (STEMI) involving left anterior descending (LAD) coronary artery ([place]) 07/03/2018  . Dark stools 04/03/2018  . Hypertensive emergency 12/30/2017  . STEMI involving left anterior descending coronary artery ([place])  10/10/2017  . MI, acute, non ST segment elevation ([employer])   . Chest pain 01/11/2016  . Carotid artery disease ([place]) 01/11/2016  . Prolonged Q-T interval on ECG 01/11/2016  . Tobacco abuse counseling 01/11/2016  . Heart palpitations 01/11/2016  . Tobacco abuse 01/11/2016  . Morbid obesity ([place]): BMI ~36 with HTN, HLD, CAD 11/25/2015  . Smoker 03/03/2015  . Coronary artery disease involving native coronary artery of native heart without angina pectoris 12/09/2014  . Chronic combined systolic (congestive) and diastolic (congestive) heart failure ([place]) 12/09/2014  . H/O noncompliance with medical treatment, presenting hazards to health 12/09/2014  . S/P primary angioplasty with coronary stent 11/18/2014  . Diabetes type 2, uncontrolled ([place]) 04/01/2013  . Hyperlipidemia associated with type 2 diabetes mellitus ([place]) 04/01/2013  . Depression 04/01/2013  . Stress at home 04/01/2013  . Essential hypertension 09/05/2011  . Type II diabetes mellitus ([place]) 12/30/2010    Past Surgical History:  Procedure Laterality Date  . CHOLECYSTECTOMY  ~ 2000  . CORONARY STENT INTERVENTION N/A 10/10/2017   Procedure: CORONARY STENT INTERVENTION;  Surgeon: Leonie Man, MD;  Location: [facility];  Service: Cardiovascular; LAD PCI: STENT SYNERGY DES 3.5X32 (p),STENT SYNERGY DES 3.5X 8 (m), STENT SYNERGY DES 3.5X28 (d).  D2 PCI: STENT SYNERGY DES 2.5X24.  (UNABL TO REWIRE & EXPAND - TO OF DIAG AT END OF CASE)  . CORONARY STENT INTERVENTION N/A 07/03/2018   Procedure: CORONARY STENT INTERVENTION;  Surgeon: Leonie Man, MD;  Location: [facility];; PTCA & overlapping DES PCI (overlaps prior stents distally) - Synergy DES 3 x 12 (3.6 mm @ overlap -- 3.3 mm distally)>  . LEFT HEART CATH AND CORONARY ANGIOGRAPHY N/A 10/10/2017   Procedure: LEFT HEART CATH AND CORONARY ANGIOGRAPHY;  Surgeon: Leonie Man, MD;  Location: [facility];  Service: Cardiovascular;  Laterality: N/A; -patent RCA  stents with mild in-stent restenosis. [place] 1,1,1 mLD-D2 46% (complicated by dissection/intramural thrombus)  . LEFT HEART CATH AND CORONARY ANGIOGRAPHY N/A 07/03/2018   Procedure: LEFT HEART CATH AND CORONARY ANGIOGRAPHY;  Surgeon: Leonie Man, MD;  Location: [facility];; 100% thrombotic occlusion distal LAD stent -- DES PCI. continued 100% D2 (previously lost). patent RCA stents, 65% OM2.  EF 30-35%.  Marland Kitchen LEFT HEART CATHETERIZATION WITH CORONARY ANGIOGRAM N/A 02/25/2013   Procedure: LEFT HEART CATHETERIZATION WITH CORONARY ANGIOGRAM;  Surgeon: Laverda Page, MD;  Location: [facility] CATH LAB;  Service: Cardiovascular;; CTO m RCA; otherwise normal coronaries  . LEFT HEART CATHETERIZATION WITH CORONARY ANGIOGRAM N/A 04/01/2013   Procedure: LEFT HEART CATHETERIZATION WITH CORONARY ANGIOGRAM;  Surgeon: Laverda Page, MD;  [facility];  Service: Cardiovascular: 100% occlusion of distal RCA stent (subacute stent thrombosis -  secondary to stopping Effient).  Overlapping DES PCI  . NM MYOVIEW [facility]  12/2015    EF 38%.  Hypertensive response to exercise (231/132 mmHg).  INTERMEDIATE RISK due to -diffuse hypokinesis  and reduced EF.  No ischemia or infarction.  Marland Kitchen PERCUTANEOUS CORONARY STENT INTERVENTION (PCI-S)  04/01/2013   Procedure: PERCUTANEOUS CORONARY STENT INTERVENTION (PCI-S);  Surgeon: Laverda Page, MD;  Location: [facility] CATH LAB;  Service: Cardiovascular;; PCI of distal RCA stent subacute thrombosis: Promus Premier DES 3.0 mm x 20 mm.  Marland Kitchen PERCUTANEOUS CORONARY STENT INTERVENTION (PCI-S)  02/25/2013   Procedure: PERCUTANEOUS CORONARY STENT INTERVENTION (PCI-S);  Surgeon: Laverda Page, MD;  Location: [facility] CATH LAB;  RCA CTO PCI with overlapping Promus DES: 3.0 mm x 38 mm, 3.5 mm x 78m  . RIGHT/LEFT HEART CATH AND CORONARY ANGIOGRAPHY N/A 11/04/2019   Procedure: RIGHT/LEFT HEART CATH AND CORONARY ANGIOGRAPHY;  Surgeon: HLeonie Man MD;  [facility];  Service:  Cardiovascular;  Laterality: N/A;  . TRANSTHORACIC ECHOCARDIOGRAM  07/04/2018   recurrent Anterior STEMI: EF 35-40%.  Apical hypokinesis.  GRII DD.  No thrombus noted.  (Improved EF from 30% up to 35-40% from previous echo)  . TRANSTHORACIC ECHOCARDIOGRAM  02/14/2019   Echo 02/14/19: Moderate LVH.  EF improved up to 45-50%.  GR 1 DD.  Severe apical akinesis.  Normal valves.  . TRANSTHORACIC ECHOCARDIOGRAM  12/18/2018   TAKOTUSBO CM: EF 20-25%-diffuse HK..  Moderately increased LV thickness.  GRII DD with elevated LVEDP.  Severe LA dilation.  Suspected Takotsubo's cardiomyopathy.  . TUBAL LIGATION  1994     OB History    Gravida  3   Para  3   Term  3   Preterm  0   AB  0   Living  3     SAB  0   TAB  0   Ectopic  0   Multiple  0   Live Births              Family History  Problem Relation Age of Onset  . Diabetes Mother   . Diabetes Sister   . Diabetes Sister   . Diabetes Son        type 1  . Diabetes Son        type 2    Social History   Tobacco Use  . Smoking status: Current Every Day Smoker    Packs/day: 0.00    Years: 22.00    Pack years: 0.00    Types: Cigarettes  . Smokeless tobacco: Never Used  . Tobacco comment: pt states she smokes about 1-2 cigarettes per day--08/01/18  Vaping Use  . Vaping Use: Never used  Substance Use Topics  . Alcohol use: Yes    Alcohol/week: 0.0 standard drinks    Comment: 07/04/2018 "only on special occasions; maybe once/yr"  . Drug use: Yes    Types: Marijuana    Comment: 02/25/2013 "quit marijuana ~ 2001"    Home Medications Prior to Admission medications   Medication Sig Start Date End Date Taking? Authorizing Provider  atorvastatin (LIPITOR) 40 MG tablet TAKE 2 TABLETS BY MOUTH DAILY 12/16/19   NCharlott Rakes MD  blood glucose meter kit and supplies Dispense based on patient and insurance preference. Use up to four times daily as directed. (FOR ICD-10 E10.9, E11.9). 07/06/18   Bhagat, BCrista Luria PA  Blood  Glucose Monitoring Suppl (TRUE METRIX METER) DEVI 1 kit by Does not apply route 4 (four) times daily. 10/24/17   NBrayton Caves PA-C  buPROPion (WELLBUTRIN SR) 150 MG 12 hr tablet Take 1 tablet (150 mg total) by mouth 2 (two) times daily. 11/17/19   NCharlott Rakes MD  carvedilol (COREG) 25 MG tablet Take 1 tablet (25 mg total) by mouth 2 (two) times daily. 01/08/20   Almyra Deforest, PA  furosemide (LASIX) 40 MG tablet Take 0.5 tablets (20 mg total) by mouth as needed (swelling). 02/10/20   Almyra Deforest, PA  gabapentin (NEURONTIN) 300 MG capsule Take 1 capsule (300 mg total) by mouth at bedtime. 11/17/19   [name], MD  glipiZIDE (GLUCOTROL) 10 MG tablet Take 1 tablet (10 mg total) by mouth 2 (two) times daily before a meal. 11/17/19   [name], MD  glucose blood test strip Use as instructed 11/05/19   [name], MD  hydrALAZINE (APRESOLINE) 50 MG tablet Take 1 tablet (50 mg total) by mouth 2 (two) times daily. 02/10/20 05/10/20  Almyra Deforest, PA  insulin glargine (LANTUS SOLOSTAR) 100 UNIT/ML Solostar Pen Inject 33 Units into the skin daily. 11/17/19   [name], MD  nitroGLYCERIN (NITROSTAT) 0.4 MG SL tablet Place 1 tablet (0.4 mg total) under the tongue every 5 (five) minutes as needed for chest pain. Reported on 11/25/2015 07/06/18   Leanor Kail, PA  pantoprazole (PROTONIX) 40 MG tablet Take 1 tablet (40 mg total) by mouth daily. 01/08/20   Almyra Deforest, PA  sacubitril-valsartan (ENTRESTO) 97-103 MG Take 1 tablet by mouth 2 (two) times daily. 01/08/20   Almyra Deforest, PA  spironolactone (ALDACTONE) 25 MG tablet Take 1 tablet (25 mg total) by mouth 2 (two) times daily. 12/24/19   Lendon Colonel, NP  ticagrelor (BRILINTA) 60 MG TABS tablet Take 1 tablet (60 mg total) by mouth 2 (two) times daily. 06/19/19   Leonie Man, MD  TRUEPLUS LANCETS 28G MISC 28 g by Does not apply route 4 (four) times daily. 07/06/18   Mendel Corning, MD    Allergies    Patient has no known  allergies.  Review of Systems   Review of Systems  All other systems reviewed and are negative.   Physical Exam Updated Vital Signs BP (!) 199/83 (BP Location: Right Arm)   Pulse 72   Temp 98.5 F (36.9 C) (Oral)   Resp (!) 21   Ht 5' 9" (1.753 m)   Wt 106 kg   LMP 10/20/2015   SpO2 100%   BMI 34.51 kg/m   Physical Exam Vitals and nursing note reviewed.  Constitutional:      General: She is not in acute distress.    Appearance: She is well-developed. She is not diaphoretic.  HENT:     Head: Normocephalic and atraumatic.  Cardiovascular:     Rate and Rhythm: Normal rate and regular rhythm.     Heart sounds: No murmur heard.  No friction rub. No gallop.   Pulmonary:     Effort: Pulmonary effort is normal. No respiratory distress.     Breath sounds: Rales present. No wheezing.     Comments: There are slight rales in the bases bilaterally. Abdominal:     General: Bowel sounds are normal. There is no distension.     Palpations: Abdomen is soft.     Tenderness: There is no abdominal tenderness.  Musculoskeletal:        General: Normal range of motion.     Cervical back: Normal range of motion and neck supple.     Right lower leg: Edema present.     Left lower leg: Edema present.     Comments: There is 1+ pitting edema of both lower extremities.  Skin:    General: Skin is  warm and dry.  Neurological:     Mental Status: She is alert and oriented to person, place, and time.     ED Results / Procedures / Treatments   Labs (all labs ordered are listed, but only abnormal results are displayed) Labs Reviewed  BASIC METABOLIC PANEL - Abnormal; Notable for the following components:      Result Value   Glucose, Bld 374 (*)    All other components within normal limits  CBC - Abnormal; Notable for the following components:   WBC 11.3 (*)    Hemoglobin 11.1 (*)    HCT 34.0 (*)    All other components within normal limits  BRAIN NATRIURETIC PEPTIDE  TROPONIN I (HIGH  SENSITIVITY)  TROPONIN I (HIGH SENSITIVITY)    EKG EKG Interpretation  Date/Time:  Sunday February 15 2020 04:50:35 EDT Ventricular Rate:  86 PR Interval:  138 QRS Duration: 96 QT Interval:  408 QTC Calculation: 488 R Axis:   42 Text Interpretation: Normal sinus rhythm Septal infarct , age undetermined Abnormal ECG When compared with ECG of 11/03/2019, T wave inversion Anterior leads is no longer present QT has shortened Confirmed by Delora Fuel ([id number]) on 02/15/2020 4:58:10 AM   Radiology DG Chest 2 View  Result Date: 02/15/2020 CLINICAL DATA:  Shortness of breath. Diabetes. Hypertension. Congestive heart failure. Smoker. EXAM: CHEST - 2 VIEW COMPARISON:  11/03/2019 FINDINGS: Midline trachea. Mild cardiomegaly. Mediastinal contours otherwise within normal limits. No pleural effusion or pneumothorax. Pulmonary interstitial thickening. No lobar consolidation. IMPRESSION: Cardiomegaly with pulmonary interstitial thickening, favoring pulmonary venous congestion. No overt congestive failure. Electronically Signed   By: Abigail Miyamoto M.D.   On: 02/15/2020 06:37    Procedures Procedures (including critical care time)  Medications Ordered in ED Medications  sodium chloride flush (NS) 0.9 % injection 3 mL (has no administration in time range)  ketorolac (TORADOL) 30 MG/ML injection 30 mg (has no administration in time range)    ED Course  I have reviewed the triage vital signs and the nursing notes.  Pertinent labs & imaging results that were available during my care of the patient were reviewed by me and considered in my medical decision making (see chart for details).    MDM Rules/Calculators/A&P  Patient is a 49 year old female with history of CHF presenting with complaints of shortness of breath and dyspnea on exertion.  She is also had elevated blood pressure and headache.  Lasix was stopped 3 weeks ago.  Patient arrives here hypertensive and work-up consistent with CHF.  Patient  given IV Lasix with a good diuresis and improvement in her breathing, blood pressure, and headache.  Troponin is negative and EKG is otherwise unchanged.  I suspect the etiology of her dyspnea is related to a CHF exacerbation.  This may also be contributing to her blood pressure and headache.  At this point, I believe she is appropriate for discharge with restart of her Lasix.  Her vitals are stable with oxygen saturations of 100% on room air.  Final Clinical Impression(s) / ED Diagnoses Final diagnoses:  None    Rx / DC Orders ED Discharge Orders    None       Veryl Speak, MD 02/15/20 1516

## 2020-02-16 MED FILL — ?PANTOPRAZOLE SODI DR 40MGT: 40 | 30 days supply | Qty: 30 | Fill #1

## 2020-02-16 MED FILL — GABAPENTIN 300 MG CAPSULE: 300 | 30 days supply | Qty: 30 | Fill #2

## 2020-02-17 ENCOUNTER — Other Ambulatory Visit: Payer: Self-pay

## 2020-02-17 ENCOUNTER — Ambulatory Visit: Payer: Self-pay | Attending: Family Medicine | Admitting: Family Medicine

## 2020-02-17 ENCOUNTER — Encounter: Payer: Self-pay | Admitting: Family Medicine

## 2020-02-17 VITALS — BP 164/84 | HR 67 | Ht 69.0 in | Wt 237.0 lb

## 2020-02-17 DIAGNOSIS — I25110 Atherosclerotic heart disease of native coronary artery with unstable angina pectoris: Secondary | ICD-10-CM

## 2020-02-17 DIAGNOSIS — I110 Hypertensive heart disease with heart failure: Secondary | ICD-10-CM

## 2020-02-17 DIAGNOSIS — R071 Chest pain on breathing: Secondary | ICD-10-CM

## 2020-02-17 DIAGNOSIS — E1165 Type 2 diabetes mellitus with hyperglycemia: Secondary | ICD-10-CM

## 2020-02-17 DIAGNOSIS — Z9114 Patient's other noncompliance with medication regimen: Secondary | ICD-10-CM

## 2020-02-17 DIAGNOSIS — R079 Chest pain, unspecified: Secondary | ICD-10-CM | POA: Insufficient documentation

## 2020-02-17 DIAGNOSIS — E1149 Type 2 diabetes mellitus with other diabetic neurological complication: Secondary | ICD-10-CM

## 2020-02-17 DIAGNOSIS — I5042 Chronic combined systolic (congestive) and diastolic (congestive) heart failure: Secondary | ICD-10-CM

## 2020-02-17 LAB — POCT GLYCOSYLATED HEMOGLOBIN (HGB A1C): HbA1c, POC (controlled diabetic range): 11.3 % — AB (ref 0.0–7.0)

## 2020-02-17 LAB — GLUCOSE, POCT (MANUAL RESULT ENTRY): POC Glucose: 215 mg/dl — AB (ref 70–99)

## 2020-02-17 MED ORDER — ASPIRIN 325 MG PO TABS
325.0000 mg | ORAL_TABLET | Freq: Once | ORAL | Status: AC
Start: 2020-02-17 — End: 2020-02-17
  Administered 2020-02-17: 325 mg via ORAL

## 2020-02-17 MED ORDER — LANTUS SOLOSTAR 100 UNIT/ML ~~LOC~~ SOPN: 38.0000 [IU] | PEN_INJECTOR | Freq: Every day | SUBCUTANEOUS | 6 refills | Status: DC

## 2020-02-17 MED ORDER — NITROGLYCERIN 0.4 MG SL SUBL
0.4000 mg | SUBLINGUAL_TABLET | Freq: Once | SUBLINGUAL | Status: AC
  Administered 2020-02-17: 0.4 mg via SUBLINGUAL

## 2020-02-17 MED ORDER — ATORVASTATIN CALCIUM 80 MG PO TABS: 80.0000 mg | ORAL_TABLET | Freq: Every day | ORAL | 1 refills | Status: DC

## 2020-02-17 MED ORDER — GABAPENTIN 300 MG PO CAPS: 300.0000 mg | ORAL_CAPSULE | Freq: Every day | ORAL | 1 refills | Status: DC

## 2020-02-17 MED ORDER — DAPAGLIFLOZIN PROPANEDIOL 10 MG PO TABS: 10.0000 mg | ORAL_TABLET | Freq: Every day | ORAL | 1 refills | Status: DC

## 2020-02-17 MED FILL — ATORVASTATIN 80 MG TABLET: 80 | 30 days supply | Qty: 30 | Fill #0

## 2020-02-17 MED FILL — FUROSEMIDE 20 MG TABS: 20 | 30 days supply | Qty: 30 | Fill #0

## 2020-02-17 MED FILL — ?BASAGLAR 100 UNITS/ML KWPE: 100 | 31 days supply | Qty: 12 | Fill #0

## 2020-02-17 MED FILL — FARXIGA 10 MG TABLET: 10 | 30 days supply | Qty: 30 | Fill #0

## 2020-02-17 NOTE — Progress Notes (Signed)
States that insulin stings once she injects. It also makes her stomach cramp.  Having pain in chest under left side of breast.

## 2020-02-17 NOTE — Progress Notes (Signed)
Subjective:  Patient ID: AMORITA VANROSSUM, female    DOB: [birth year]  Age: 49 y.o. MRN: [id number]  CC: Diabetes   HPI Amy Chaney  is a 49 year old female with a history of type 2 diabetes mellitus (A1c11.3), hypertension, CHF (EF20-25% from 10/2019 which is down from 45-50% 01/2019 ), CAD (status post PCI to LAD with 3 overlapping DES in 09/2017), STEMI in 06/2018 She has had chest pain which started today 6/10 in her parasternal area radiating to her L inframammary region  Did not take her Nitroglycerin and has not taken any medication this morning as she had to cal the EMS this morning for her son who had hypoglycemia prior to rushing here.  She has been without Lasix which was prescribed at her ED visit 2 days ago when she was managed for acute on chronic congestive heart failure, chest x-ray revealed organomegaly with pulmonary interstitial thickening favoring pulmonary venous congestion, no overt congestive failure.  She informs me she had previously asked for Lasix at her last cardiology visit but was informed she did not need it.   With regards to her diabetes mellitus she has been taking her Lantus every other day due to injection site pain.  Her A1c is 11.3 which is up from 10.2 previously.  Past Medical History:  Diagnosis Date  . Coronary artery disease involving native coronary artery of native heart with angina pectoris ([place]) 01/2013   a. s/p PCI of the mid RCA 01/2013 //  b. LHC 9/14: EF 55%, RCA stent 100% occluded, LAD irregularities >>  subacute stent thrombosis, Promus DES PCI distal overlap // c) 09/2017 - Ant STEMI - LAD-D2 PCI - Successful PCI of LAD (3 overlapping DES), unable to restore flow down D2l that was stented as well.  Likely related to downstream dissection, but unable to rewire.  . Daily headache   . Depression   . Dyslipidemia, goal LDL below 70   . History of Doppler ultrasound    carotid bruit >> a. Carotid US 6/17: bilat ICA 1-39%  . History of  Takotsubo cardiomyopathy 11/2018   Admitted for acute on chronic combined systolic and diastolic heart failure.  EF down to 20-25% global hypokinesis.  Was in the setting of grieving over the loss of her son from MI.  ->  EF improved to 45-50% on follow-up echocardiogram.  . Hypertension   . Ischemic cardiomyopathy 06/2018   s/p inferior STEMI followed by 2 anterior STEMIs: Following recovery from Takotsubo Cardiomyopathy- Echo 02/14/19: Moderate LVH.  EF improved up to 45-50%.  GR 1 DD.  Severe apical akinesis.  Normal valves.  . Mild dysplasia of cervix (CIN I) 02/04/2019   Seen after colposcopy on 01/23/19 done for ASCU +HPV pap  > repeat pap and HPV test in 12 and 24 months  . STEMI involving left anterior descending coronary artery ([place]) 09/2017; 06/2018   a) Severe [place] 1, 1, 1 LAD-D2 lesion (complicated by post PTCA dissection/intramural hematoma) -successful extensive PCI of the LAD but unable to maintain patency of the stented D2. b) distal mLAD stent Edge 100% thrombosis --> PTCA & Overlapping DES PCI (Synergy 3 x 12 --> 3.6-3.3 mm). D2 occluded. RCA stents patent, 65 % OM2. EF 30-35%.  Marland Kitchen STEMI involving oth coronary artery of inferior wall ([place]) 03/2013   Secondary to subacute stent thrombosis of RCA stent segment having missed 4 doses of Effient.  . Tobacco abuse 01/11/2016  . Type II diabetes mellitus ([place]) 12/2010  sister and son also diabetic     Past Surgical History:  Procedure Laterality Date  . CHOLECYSTECTOMY  ~ 2000  . CORONARY STENT INTERVENTION N/A 10/10/2017   Procedure: CORONARY STENT INTERVENTION;  Surgeon: Leonie Man, MD;  Location: [facility];  Service: Cardiovascular; LAD PCI: STENT SYNERGY DES 3.5X32 (p),STENT SYNERGY DES 3.5X 8 (m), STENT SYNERGY DES 3.5X28 (d).  D2 PCI: STENT SYNERGY DES 2.5X24.  (UNABL TO REWIRE & EXPAND - TO OF DIAG AT END OF CASE)  . CORONARY STENT INTERVENTION N/A 07/03/2018   Procedure: CORONARY STENT INTERVENTION;  Surgeon:  Leonie Man, MD;  Location: [facility];; PTCA & overlapping DES PCI (overlaps prior stents distally) - Synergy DES 3 x 12 (3.6 mm @ overlap -- 3.3 mm distally)>  . LEFT HEART CATH AND CORONARY ANGIOGRAPHY N/A 10/10/2017   Procedure: LEFT HEART CATH AND CORONARY ANGIOGRAPHY;  Surgeon: Leonie Man, MD;  Location: [facility];  Service: Cardiovascular;  Laterality: N/A; -patent RCA stents with mild in-stent restenosis. [place] 1,1,1 mLD-D2 94% (complicated by dissection/intramural thrombus)  . LEFT HEART CATH AND CORONARY ANGIOGRAPHY N/A 07/03/2018   Procedure: LEFT HEART CATH AND CORONARY ANGIOGRAPHY;  Surgeon: Leonie Man, MD;  Location: [place] [facility];; 100% thrombotic occlusion distal LAD stent -- DES PCI. continued 100% D2 (previously lost). patent RCA stents, 65% OM2.  EF 30-35%.  Marland Kitchen LEFT HEART CATHETERIZATION WITH CORONARY ANGIOGRAM N/A 02/25/2013   Procedure: LEFT HEART CATHETERIZATION WITH CORONARY ANGIOGRAM;  Surgeon: Laverda Page, MD;  Location: [facility] C[facility] LAB;  Service: Cardiovascular;; CTO m RCA; otherwise normal coronaries  . LEFT HEART CATHETERIZATION WITH CORONARY ANGIOGRAM N/A 04/01/2013   Procedure: LEFT HEART CATHETERIZATION WITH CORONARY ANGIOGRAM;  Surgeon: Laverda Page, MD;  Location: [facility] CATH LAB;  Service: Cardiovascular: 100% occlusion of distal RCA stent (subacute stent thrombosis -  secondary to stopping Effient).  Overlapping DES PCI  . NM MYOVIEW LTD  12/2015    EF 38%.  Hypertensive response to exercise (231/132 mmHg).  INTERMEDIATE RISK due to -diffuse hypokinesis and reduced EF.  No ischemia or infarction.  Marland Kitchen PERCUTANEOUS CORONARY STENT INTERVENTION (PCI-S)  04/01/2013   Procedure: PERCUTANEOUS CORONARY STENT INTERVENTION (PCI-S);  Surgeon: Laverda Page, MD;  Location: [facility] CATH LAB;  Service: Cardiovascular;; PCI of distal RCA stent subacute thrombosis: Promus Premier DES 3.0 mm x 20 mm.  Marland Kitchen PERCUTANEOUS CORONARY STENT INTERVENTION (PCI-S)   02/25/2013   Procedure: PERCUTANEOUS CORONARY STENT INTERVENTION (PCI-S);  Surgeon: Laverda Page, MD;  Location: [facility] CATH LAB;  RCA CTO PCI with overlapping Promus DES: 3.0 mm x 38 mm, 3.5 mm x 48m  . RIGHT/LEFT HEART CATH AND CORONARY ANGIOGRAPHY N/A 11/04/2019   Procedure: RIGHT/LEFT HEART CATH AND CORONARY ANGIOGRAPHY;  Surgeon: HLeonie Man MD;  [facility];  Service: Cardiovascular;  Laterality: N/A;  . TRANSTHORACIC ECHOCARDIOGRAM  07/04/2018   recurrent Anterior STEMI: EF 35-40%.  Apical hypokinesis.  GRII DD.  No thrombus noted.  (Improved EF from 30% up to 35-40% from previous echo)  . TRANSTHORACIC ECHOCARDIOGRAM  02/14/2019   Echo 02/14/19: Moderate LVH.  EF improved up to 45-50%.  GR 1 DD.  Severe apical akinesis.  Normal valves.  . TRANSTHORACIC ECHOCARDIOGRAM  12/18/2018   TAKOTUSBO CM: EF 20-25%-diffuse HK..  Moderately increased LV thickness.  GRII DD with elevated LVEDP.  Severe LA dilation.  Suspected Takotsubo's cardiomyopathy.  . TUBAL LIGATION  1994  Family History  Problem Relation Age of Onset  . Diabetes Mother   . Diabetes Sister   . Diabetes Sister   . Diabetes Son        type 1  . Diabetes Son        type 2    No Known Allergies  Outpatient Medications Prior to Visit  Medication Sig Dispense Refill  . atorvastatin (LIPITOR) 40 MG tablet TAKE 2 TABLETS BY MOUTH DAILY 60 tablet 2  . blood glucose meter kit and supplies Dispense based on patient and insurance preference. Use up to four times daily as directed. (FOR ICD-10 E10.9, E11.9). 1 each 0  . Blood Glucose Monitoring Suppl (TRUE METRIX METER) DEVI 1 kit by Does not apply route 4 (four) times daily. 1 Device 0  . buPROPion (WELLBUTRIN SR) 150 MG 12 hr tablet Take 1 tablet (150 mg total) by mouth 2 (two) times daily. 60 tablet 3  . carvedilol (COREG) 25 MG tablet Take 1 tablet (25 mg total) by mouth 2 (two) times daily. 180 tablet 3  . furosemide (LASIX) 20 MG tablet Take 1 tablet  (20 mg total) by mouth daily. 30 tablet 0  . gabapentin (NEURONTIN) 300 MG capsule Take 1 capsule (300 mg total) by mouth at bedtime. 30 capsule 3  . glipiZIDE (GLUCOTROL) 10 MG tablet Take 1 tablet (10 mg total) by mouth 2 (two) times daily before a meal. 180 tablet 1  . glucose blood test strip Use as instructed 100 each 12  . hydrALAZINE (APRESOLINE) 50 MG tablet Take 1 tablet (50 mg total) by mouth 2 (two) times daily. 60 tablet 6  . insulin glargine (LANTUS SOLOSTAR) 100 UNIT/ML Solostar Pen Inject 33 Units into the skin daily. 30 mL 3  . nitroGLYCERIN (NITROSTAT) 0.4 MG SL tablet Place 1 tablet (0.4 mg total) under the tongue every 5 (five) minutes as needed for chest pain. Reported on 11/25/2015 25 tablet 3  . pantoprazole (PROTONIX) 40 MG tablet Take 1 tablet (40 mg total) by mouth daily. 30 tablet 11  . sacubitril-valsartan (ENTRESTO) 97-103 MG Take 1 tablet by mouth 2 (two) times daily. 180 tablet 0  . spironolactone (ALDACTONE) 25 MG tablet Take 1 tablet (25 mg total) by mouth 2 (two) times daily. 180 tablet 3  . ticagrelor (BRILINTA) 60 MG TABS tablet Take 1 tablet (60 mg total) by mouth 2 (two) times daily. 60 tablet 11  . TRUEPLUS LANCETS 28G MISC 28 g by Does not apply route 4 (four) times daily. 120 each 2   No facility-administered medications prior to visit.     ROS Review of Systems  Constitutional: Negative for activity change, appetite change and fatigue.  HENT: Negative for congestion, sinus pressure and sore throat.   Eyes: Negative for visual disturbance.  Respiratory: Negative for cough, chest tightness, shortness of breath and wheezing.   Cardiovascular: Negative for chest pain and palpitations.  Gastrointestinal: Negative for abdominal distention, abdominal pain and constipation.  Endocrine: Negative for polydipsia.  Genitourinary: Negative for dysuria and frequency.  Musculoskeletal: Negative for arthralgias and back pain.  Skin: Negative for rash.    Neurological: Negative for tremors, light-headedness and numbness.  Hematological: Does not bruise/bleed easily.  Psychiatric/Behavioral: Negative for agitation and behavioral problems.    Objective:  BP (!) 164/84   Pulse 67   Ht _0  (1.753 m)   Wt 237 lb (107.5 kg)   LMP 10/20/2015   SpO2 100%   BMI 35.00 kg/m  BP/Weight 02/17/2020 02/15/2020 7/82/4235  Systolic BP 361 443 154  Diastolic BP 84 94 80  Wt. (Lbs) 237 233.69 240.6  BMI 35 34.51 35.53      Physical Exam Constitutional:      Appearance: She is well-developed.  Neck:     Vascular: No JVD.  Cardiovascular:     Rate and Rhythm: Normal rate.     Heart sounds: Normal heart sounds. No murmur heard.   Pulmonary:     Effort: Pulmonary effort is normal.     Breath sounds: Normal breath sounds. No wheezing or rales.  Chest:     Chest wall: No tenderness.  Abdominal:     General: Bowel sounds are normal. There is no distension.     Palpations: Abdomen is soft. There is no mass.     Tenderness: There is no abdominal tenderness.  Musculoskeletal:        General: Normal range of motion.     Right lower leg: No edema.     Left lower leg: No edema.  Neurological:     Mental Status: She is alert and oriented to person, place, and time.  Psychiatric:        Mood and Affect: Mood normal.     CMP Latest Ref Rng & Units 02/15/2020 01/15/2020 01/07/2020  Glucose 70 - 99 mg/dL 374(H) 407(H) 333(H)  BUN 6 - 20 mg/dL _0 Creatinine 0.44 - 1.00 mg/dL 0.89 0.91 0.85  Sodium 135 - 145 mmol/L 138 138 136  Potassium 3.5 - 5.1 mmol/L 3.6 4.1 4.0  Chloride 98 - 111 mmol/L 105 100 99  CO2 22 - 32 mmol/L _1 Calcium 8.9 - 10.3 mg/dL 9.3 9.1 9.0  Total Protein 6.5 - 8.1 g/dL - - -  Total Bilirubin 0.3 - 1.2 mg/dL - - -  Alkaline Phos 38 - 126 U/L - - -  AST 15 - 41 U/L - - -  ALT 0 - 44 U/L - - -    Lipid Panel     Component Value Date/Time   CHOL 164 07/05/2018 0330   TRIG 85 12/18/2018 0318   HDL 39 (L)  07/05/2018 0330   CHOLHDL 4.2 07/05/2018 0330   VLDL 15 07/05/2018 0330   LDLCALC 110 (H) 07/05/2018 0330    CBC    Component Value Date/Time   WBC 11.3 (H) 02/15/2020 0459   RBC 4.10 02/15/2020 0459   HGB 11.1 (L) 02/15/2020 0459   HCT 34.0 (L) 02/15/2020 0459   HCT 37.0 11/03/2019 0720   PLT 347 02/15/2020 0459   MCV 82.9 02/15/2020 0459   MCH 27.1 02/15/2020 0459   MCHC 32.6 02/15/2020 0459   RDW 14.9 02/15/2020 0459   LYMPHSABS 6.2 (H) 10/31/2019 0858   MONOABS 0.8 10/31/2019 0858   EOSABS 0.4 10/31/2019 0858   BASOSABS 0.1 10/31/2019 0858    Lab Results  Component Value Date   HGBA1C 11.3 (A) 02/17/2020    Assessment & Plan:  1. Uncontrolled type 2 diabetes mellitus with hyperglycemia ([place]) Uncontrolled with A1c of 11.3; goal is less than 7.0 Farxiga added to regimen Compliance with insulin has been a major issue and Pharm.D. called into educate on administration technique Counseled on Diabetic diet, my plate method, 008 minutes of moderate intensity exercise/week Blood sugar logs with fasting goals of 80-120 mg/dl, random of less than 180 and in the event of sugars less than 60 mg/dl or greater than 400 mg/dl encouraged to notify  the clinic. Advised on the need for annual eye exams, annual foot exams, Pneumonia vaccine. - POCT glucose (manual entry) - POCT glycosylated hemoglobin (Hb A1C) - dapagliflozin propanediol (FARXIGA) 10 MG TABS tablet; Take 1 tablet (10 mg total) by mouth daily before breakfast.  Dispense: 90 tablet; Refill: 1 - atorvastatin (LIPITOR) 80 MG tablet; Take 1 tablet (80 mg total) by mouth daily.  Dispense: 90 tablet; Refill: 1 - insulin glargine (LANTUS SOLOSTAR) 100 UNIT/ML Solostar Pen; Inject 38 Units into the skin daily.  Dispense: 30 mL; Refill: 6  2. Other diabetic neurological complication associated with type 2 diabetes mellitus (HCC) Stable - gabapentin (NEURONTIN) 300 MG capsule; Take 1 capsule (300 mg total) by mouth at bedtime.   Dispense: 90 capsule; Refill: 1  3. Non compliance w medication regimen Noncompliance with insulin due to injection site pain We have provided education on this  4. Chest pain on breathing EKG reveals LVH, possible left atrial enlargement, prolonged QT (which has improved compared to previous), no ST changes Chest pain could be related to anxiety involved with son who is on the way to the hospital Administered aspirin and nitroglycerin Advised to present to the ED if pain persist - aspirin tablet 325 mg - nitroGLYCERIN (NITROSTAT) SL tablet 0.4 mg  5. Coronary artery disease involving native coronary artery of native heart with unstable angina pectoris (HCC) Risk factor modification Continue high intensity statin  6. Hypertensive heart disease with chronic combined systolic and diastolic congestive heart failure (HCC) EF of 20 to 25% Advised to fill prescription for Lasix received from the ED. Hopefully addition of SGLT2 will be beneficial with regards to volume contraction  Return in about 3 months (around 05/19/2020), or if symptoms worsen or fail to improve, for Chronic disease management.     [name], MD, FAAFP. [facility] and [place], [place]   02/17/2020, 8:57 AM

## 2020-02-17 NOTE — Patient Instructions (Signed)
Angina  Angina is extreme discomfort in the chest, neck, arm, jaw, or back. The discomfort is caused by a lack of blood in the middle layer of the heart wall (myocardium). There are four types of angina:  Stable angina. This is triggered by vigorous activity or exercise. It goes away when you rest or take angina medicine.  Unstable angina. This is a warning sign and can lead to a heart attack (acute coronary syndrome). This is a medical emergency. Symptoms come at rest and last a long time.  Microvascular angina. This affects the small coronary arteries. Symptoms include feeling tired and being short of breath.  Prinzmetal or variant angina. This is caused by a tightening (spasm) of the arteries that go to your heart. What are the causes? This condition is caused by atherosclerosis. This is the buildup of fat and cholesterol (plaque) in your arteries. The plaque may narrow or block the artery. Other causes of angina include:  Sudden tightening of the muscles of the arteries in the heart (coronary spasm).  Small artery disease (microvascular dysfunction).  Problems with any of your heart valves (heart valve disease).  A tear in an artery in your heart (coronary artery dissection).  Diseases of the heart muscle (cardiomyopathy), or other heart diseases. What increases the risk? You are more likely to develop this condition if you have:  High cholesterol.  High blood pressure (hypertension).  Diabetes.  A family history of heart disease.  An inactive (sedentary) lifestyle, or you do not exercise enough.  Depression.  Had radiation treatment to the left side of your chest. Other risk factors include:  Using tobacco.  Being obese.  Eating a diet high in saturated fats.  Being exposed to high stress or triggers of stress.  Using drugs, such as cocaine. Women have a greater risk for angina if:  They are older than 55.  They have gone through menopause (are  postmenopausal). What are the signs or symptoms? Common symptoms of this condition in both men and women may include:  Chest pain, which may: ? Feel like a crushing or squeezing in the chest, or like a tightness, pressure, fullness, or heaviness in the chest. ? Last for more than a few minutes at a time, or it may stop and come back (recur) over the course of a few minutes.  Pain in the neck, arm, jaw, or back.  Unexplained heartburn or indigestion.  Shortness of breath.  Nausea.  Sudden cold sweats. Women and people with diabetes may have unusual (atypical) symptoms, such as:  Fatigue.  Unexplained feelings of nervousness or anxiety.  Unexplained weakness.  Dizziness or fainting. How is this diagnosed? This condition may be diagnosed based on:  Your symptoms and medical history.  Electrocardiogram (ECG) to measure the electrical activity in your heart.  Blood tests.  Stress test to look for signs of blockage when your heart is stressed.  CT angiogram to examine your heart and the blood flow to it.  Coronary angiogram to check your coronary arteries for blockage. How is this treated? Angina may be treated with:  Medicines to: ? Prevent blood clots and heart attack. ? Relax blood vessels and improve blood flow to the heart (nitrates). ? Reduce blood pressure, improve the pumping action of the heart, and relax blood vessels that are spasming. ? Reduce cholesterol and help treat atherosclerosis.  A procedure to widen a narrowed or blocked coronary artery (angioplasty). A mesh tube may be placed in a coronary artery to   keep it open (coronary stenting).  Surgery to allow blood to go around a blocked artery (coronary artery bypass surgery). Follow these instructions at home: Medicines  Take over-the-counter and prescription medicines only as told by your health care provider.  Do not take the following medicines unless your health care provider approves: ? NSAIDs,  such as ibuprofen or naproxen. ? Vitamin supplements that contain vitamin A, vitamin E, or both. ? Hormone replacement therapy that contains estrogen with or without progestin. Eating and drinking   Eat a heart-healthy diet. This includes plenty of fresh fruits and vegetables, whole grains, low-fat (lean) protein, and low-fat dairy products.  Follow instructions from your health care provider about eating or drinking restrictions. Activity  Follow an exercise program approved by your health care provider.  Consider joining a cardiac rehabilitation program.  Take a break when you feel fatigued. Plan rest periods in your daily activities. Lifestyle   Do not use any products that contain nicotine or tobacco, such as cigarettes, e-cigarettes, and chewing tobacco. If you need help quitting, ask your health care provider.  If your health care provider says you can drink alcohol: ? Limit how much you use to:  0-1 drink a day for nonpregnant women.  0-2 drinks a day for men. ? Be aware of how much alcohol is in your drink. In the [place] one drink equals one 12 oz bottle of beer (355 mL), one 5 oz glass of wine (148 mL), or one 1 oz glass of hard liquor (44 mL). General instructions  Maintain a healthy weight.  Learn to manage stress.  Keep your vaccinations up to date. Get the flu (influenza) vaccine every year.  Talk to your health care provider if you feel depressed. Take a depression screening test to see if you are at risk for depression.  Work with your health care provider to manage other health conditions, such as hypertension or diabetes.  Keep all follow-up visits as told by your health care provider. This is important. Get help right away if:  You have pain in your chest, neck, arm, jaw, or back, and the pain: ? Lasts more than a few minutes. ? Is recurring. ? Is not relieved by taking medicines under the tongue (sublingual nitroglycerin). ? Increases in intensity or  frequency.  You have a lot of sweating without cause.  You have unexplained: ? Heartburn or indigestion. ? Shortness of breath or difficulty breathing. ? Nausea or vomiting. ? Fatigue. ? Feelings of nervousness or anxiety. ? Weakness.  You have sudden light-headedness or dizziness.  You faint. These symptoms may represent a serious problem that is an emergency. Do not wait to see if the symptoms will go away. Get medical help right away. Call your local emergency services (911 in the [place] Do not drive yourself to the hospital. Summary  Angina is extreme discomfort in the chest, neck, arm, jaw, or back that is caused by a lack of blood in the heart wall.  There are many symptoms of angina. They include chest pain, unexplained heartburn or indigestion, sudden cold sweats, and fatigue.  Angina may be treated with behavioral changes, medicine, or surgery.  Symptoms of angina may represent an emergency. Get medical help right away. Call your local emergency services (911 in the [place] Do not drive yourself to the hospital. This information is not intended to replace advice given to you by your health care provider. Make sure you discuss any questions you have with your health care provider.   Document Revised: 03/04/2018 Document Reviewed: 03/04/2018 Elsevier Patient Education  2020 [employer]vier Inc.  

## 2020-02-18 ENCOUNTER — Encounter: Payer: Self-pay | Admitting: Family Medicine

## 2020-02-26 MED FILL — ?SPIRONOLACTONE 25 MG TABLE: 25 | 30 days supply | Qty: 60 | Fill #2

## 2020-02-26 MED FILL — glipiZIDE 10 MG TABS: 10 | 30 days supply | Qty: 60 | Fill #1

## 2020-02-26 MED FILL — BRILINTA 60 MG TABLET: 60 | 30 days supply | Qty: 60 | Fill #6

## 2020-02-26 MED FILL — ?CARVEDILOL 25 MG TABLET: 25 | 30 days supply | Qty: 60 | Fill #1

## 2020-03-17 ENCOUNTER — Encounter: Payer: Self-pay | Admitting: Physician Assistant

## 2020-03-17 ENCOUNTER — Other Ambulatory Visit: Payer: Self-pay

## 2020-03-17 ENCOUNTER — Ambulatory Visit (INDEPENDENT_AMBULATORY_CARE_PROVIDER_SITE_OTHER): Payer: Self-pay | Admitting: Physician Assistant

## 2020-03-17 VITALS — BP 120/72 | HR 67 | Temp 98.2°F | Ht 69.0 in | Wt 221.0 lb

## 2020-03-17 DIAGNOSIS — Z79899 Other long term (current) drug therapy: Secondary | ICD-10-CM

## 2020-03-17 DIAGNOSIS — I2510 Atherosclerotic heart disease of native coronary artery without angina pectoris: Secondary | ICD-10-CM

## 2020-03-17 DIAGNOSIS — E785 Hyperlipidemia, unspecified: Secondary | ICD-10-CM

## 2020-03-17 DIAGNOSIS — E1165 Type 2 diabetes mellitus with hyperglycemia: Secondary | ICD-10-CM

## 2020-03-17 DIAGNOSIS — I5042 Chronic combined systolic (congestive) and diastolic (congestive) heart failure: Secondary | ICD-10-CM

## 2020-03-17 MED FILL — hydrALAZINE HCL 50 MG TABS: 50 | 30 days supply | Qty: 60 | Fill #1

## 2020-03-17 NOTE — Patient Instructions (Addendum)
Medication Instructions:  The current medical regimen is effective;  continue present plan and medications as directed. Please refer to the Current Medication list given to you today. *If you need a refill on your cardiac medications before your next appointment, please call your pharmacy*  Lab Work: BMET TODAY If you have labs (blood work) drawn today and your tests are completely normal, you will receive your results only by: Marland Kitchen MyChart Message (if you have MyChart) OR . A paper copy in the mail If you have any lab test that is abnormal or we need to change your treatment, we will call you to review the results.  MAKE SURE TO DISCUSS YOUR UNCONTROLLED DIABETES WITH YOUR PCP  Follow-Up: At [facility], you and your health needs are our priority.  As part of our continuing mission to provide you with exceptional heart care, we have created designated Provider Care Teams.  These Care Teams include your primary Cardiologist (physician) and Advanced Practice Providers (APPs -  Physician Assistants and Nurse Practitioners) who all work together to provide you with the care you need, when you need it.  We recommend signing up for the patient portal called "MyChart".  Sign up information is provided on this After Visit Summary.  MyChart is used to connect with patients for Virtual Visits (Telemedicine).  Patients are able to view lab/test results, encounter notes, upcoming appointments, etc.  Non-urgent messages can be sent to your provider as well.   To learn more about what you can do with MyChart, go to [email or web address].    Your next appointment:   KEEP SCHEDULED APPOINTMENT WITH DR [facility]   The format for your next appointment:   In Person  Provider:   Glenetta Hew, MD

## 2020-03-17 NOTE — Progress Notes (Signed)
Cardiology Office Note:    Date:  03/19/2020   ID:  Amy Chaney, DOB 23-Feb-1971, MRN [id number]  PCP:  [name], MD  [facility] Cardiologist:  Glenetta Hew, MD  [place] Electrophysiologist:  None   Referring MD: [name], MD   Chief Complaint  Patient presents with  . Follow-up    seen for Dr. Ellyn Hack    History of Present Illness:    Amy Chaney is a 49 y.o. female with a hx of CAD, hyperlipidemia, Takotsubo cardiomyopathy, ischemic cardiomyopathy,DM2 and tobacco abuse. She underwent PCI of RCA on 2 separate occasionsin 2014 and PCI/DES x3 to diagonal/LAD in 09/2017. Procedure was complicated by the loss of second diagonal despite attempted stent placement secondary to dissection and inability to rewire. Ejection fraction in March 2019 was 35%. Patient had a distal stent thrombosis of LAD in December 2019 and underwent repeat DES that overlapped withpreviously placed stents. She was admitted in May 2020 with acute heart failure. Ejection fraction at the time was down to 20 to 25% by echo which was thought to be Takotsubo cardiomyopathy related to her son's death. Last echocardiogram obtained on 02/14/2019 showed ejection fraction has improved to 45-50%.   Patient was admitted in April 2021 with acute on chronic combined systolic and diastolic heart failure.  She ultimately underwent a left and right heart cath due to worsening LV dysfunction.  Cardiac catheterization showed patent stents, chronically occluded diagonal branch of LAD.  She was eventually discharged on heart failure medications including carvedilol, Entresto and spironolactone.  It was suspected that she was not completely adhering to the low-sodium diet which can cause her blood pressure to become quite elevated.  I last saw the patient on 02/10/2020, despite increasing Entresto and carvedilol, her blood pressure remains elevated.  She says she has been compliant with her medication.  I  added hydralazine 50 mg twice a day to her medical regimen.  More recently, patient went to the ED on 02/15/2020 with worsening dyspnea and orthopnea.  Recent hemoglobin A1c was 11.3 on 02/17/2020.  Patient presents today for cardiology follow-up.  She denies any significant shortness of breath however does admit to have occasional orthopnea.  She has no lower extremity edema and her lung is clear on exam.  She appears to be euvolemic.  Compared to the previous office visit, she has had a significant weight loss.  I recommend a basic metabolic panel.  Otherwise, she can follow-up with Dr. Ellyn Hack.  Unfortunately she does not have insurance and echocardiogram cost roughly $800 in the [place] and [place] office and around $1800 in the [place] office.   Past Medical History:  Diagnosis Date  . Coronary artery disease involving native coronary artery of native heart with angina pectoris ([place]) 01/2013   a. s/p PCI of the mid RCA 01/2013 //  b. LHC 9/14: EF 55%, RCA stent 100% occluded, LAD irregularities >>  subacute stent thrombosis, Promus DES PCI distal overlap // c) 09/2017 - Ant STEMI - LAD-D2 PCI - Successful PCI of LAD (3 overlapping DES), unable to restore flow down D2l that was stented as well.  Likely related to downstream dissection, but unable to rewire.  . Daily headache   . Depression   . Dyslipidemia, goal LDL below 70   . History of Doppler ultrasound    carotid bruit >> a. Carotid US 6/17: bilat ICA 1-39%  . History of Takotsubo cardiomyopathy 11/2018   Admitted for acute on chronic combined systolic and  diastolic heart failure.  EF down to 20-25% global hypokinesis.  Was in the setting of grieving over the loss of her son from MI.  ->  EF improved to 45-50% on follow-up echocardiogram.  . Hypertension   . Ischemic cardiomyopathy 06/2018   s/p inferior STEMI followed by 2 anterior STEMIs: Following recovery from Takotsubo Cardiomyopathy- Echo 02/14/19: Moderate LVH.  EF improved up to  45-50%.  GR 1 DD.  Severe apical akinesis.  Normal valves.  . Mild dysplasia of cervix (CIN I) 02/04/2019   Seen after colposcopy on 01/23/19 done for ASCU +HPV pap  > repeat pap and HPV test in 12 and 24 months  . STEMI involving left anterior descending coronary artery ([place]) 09/2017; 06/2018   a) Severe [place] 1, 1, 1 LAD-D2 lesion (complicated by post PTCA dissection/intramural hematoma) -successful extensive PCI of the LAD but unable to maintain patency of the stented D2. b) distal mLAD stent Edge 100% thrombosis --> PTCA & Overlapping DES PCI (Synergy 3 x 12 --> 3.6-3.3 mm). D2 occluded. RCA stents patent, 65 % OM2. EF 30-35%.  Marland Kitchen STEMI involving oth coronary artery of inferior wall ([place]) 03/2013   Secondary to subacute stent thrombosis of RCA stent segment having missed 4 doses of Effient.  . Tobacco abuse 01/11/2016  . Type II diabetes mellitus ([place]) 12/2010   sister and son also diabetic     Past Surgical History:  Procedure Laterality Date  . CHOLECYSTECTOMY  ~ 2000  . CORONARY STENT INTERVENTION N/A 10/10/2017   Procedure: CORONARY STENT INTERVENTION;  Surgeon: Leonie Man, MD;  Location: [place] [facility];  Service: Cardiovascular; LAD PCI: STENT SYNERGY DES 3.5X32 (p),STENT SYNERGY DES 3.5X 8 (m), STENT SYNERGY DES 3.5X28 (d).  D2 PCI: STENT SYNERGY DES 2.5X24.  (UNABL TO REWIRE & EXPAND - TO OF DIAG AT END OF CASE)  . CORONARY STENT INTERVENTION N/A 07/03/2018   Procedure: CORONARY STENT INTERVENTION;  Surgeon: Leonie Man, MD;  Location: [facility];; PTCA & overlapping DES PCI (overlaps prior stents distally) - Synergy DES 3 x 12 (3.6 mm @ overlap -- 3.3 mm distally)>  . LEFT HEART CATH AND CORONARY ANGIOGRAPHY N/A 10/10/2017   Procedure: LEFT HEART CATH AND CORONARY ANGIOGRAPHY;  Surgeon: Leonie Man, MD;  Location: [place] [facility];  Service: Cardiovascular;  Laterality: N/A; -patent RCA stents with mild in-stent restenosis. [place] 1,1,1 mLD-D2 00% (complicated by  dissection/intramural thrombus)  . LEFT HEART CATH AND CORONARY ANGIOGRAPHY N/A 07/03/2018   Procedure: LEFT HEART CATH AND CORONARY ANGIOGRAPHY;  Surgeon: Leonie Man, MD;  Location: [place] [facility];; 100% thrombotic occlusion distal LAD stent -- DES PCI. continued 100% D2 (previously lost). patent RCA stents, 65% OM2.  EF 30-35%.  Marland Kitchen LEFT HEART CATHETERIZATION WITH CORONARY ANGIOGRAM N/A 02/25/2013   Procedure: LEFT HEART CATHETERIZATION WITH CORONARY ANGIOGRAM;  Surgeon: Laverda Page, MD;  Location[facility] C[facility];  Service: Cardiovascular;; CTO m RCA; otherwise normal coronaries  . LEFT HEART CATHETERIZATION WITH CORONARY ANGIOGRAM N/A 04/01/2013   Procedure: LEFT HEART CATHETERIZATION WITH CORONARY ANGIOGRAM;  Surgeon: Laverda Page, MD;  Location: [facility] CATH LAB;  Service: Cardiovascular: 100% occlusion of distal RCA stent (subacute stent thrombosis -  secondary to stopping Effient).  Overlapping DES PCI  . NM MYOVIEW LTD  12/2015    EF 38%.  Hypertensive response to exercise (231/132 mmHg).  INTERMEDIATE RISK due to -diffuse hypokinesis and reduced EF.  No ischemia or infarction.  Marland Kitchen  PERCUTANEOUS CORONARY STENT INTERVENTION (PCI-S)  04/01/2013   Procedure: PERCUTANEOUS CORONARY STENT INTERVENTION (PCI-S);  Surgeon: Laverda Page, MD;  Location: [facility] CATH LAB;  Service: Cardiovascular;; PCI of distal RCA stent subacute thrombosis: Promus Premier DES 3.0 mm x 20 mm.  Marland Kitchen PERCUTANEOUS CORONARY STENT INTERVENTION (PCI-S)  02/25/2013   Procedure: PERCUTANEOUS CORONARY STENT INTERVENTION (PCI-S);  Surgeon: Laverda Page, MD;  Location: [facility] CATH LAB;  RCA CTO PCI with overlapping Promus DES: 3.0 mm x 38 mm, 3.5 mm x 35m  . RIGHT/LEFT HEART CATH AND CORONARY ANGIOGRAPHY N/A 11/04/2019   Procedure: RIGHT/LEFT HEART CATH AND CORONARY ANGIOGRAPHY;  Surgeon: HLeonie Man MD;  [place];  Service: Cardiovascular;  Laterality: N/A;  . TRANSTHORACIC ECHOCARDIOGRAM  07/04/2018    recurrent Anterior STEMI: EF 35-40%.  Apical hypokinesis.  GRII DD.  No thrombus noted.  (Improved EF from 30% up to 35-40% from previous echo)  . TRANSTHORACIC ECHOCARDIOGRAM  02/14/2019   Echo 02/14/19: Moderate LVH.  EF improved up to 45-50%.  GR 1 DD.  Severe apical akinesis.  Normal valves.  . TRANSTHORACIC ECHOCARDIOGRAM  12/18/2018   TAKOTUSBO CM: EF 20-25%-diffuse HK..  Moderately increased LV thickness.  GRII DD with elevated LVEDP.  Severe LA dilation.  Suspected Takotsubo's cardiomyopathy.  . TUBAL LIGATION  1994    Current Medications: Current Meds  Medication Sig  . atorvastatin (LIPITOR) 80 MG tablet Take 1 tablet (80 mg total) by mouth daily.  . blood glucose meter kit and supplies Dispense based on patient and insurance preference. Use up to four times daily as directed. (FOR ICD-10 E10.9, E11.9).  .Marland KitchenBlood Glucose Monitoring Suppl (TRUE METRIX METER) DEVI 1 kit by Does not apply route 4 (four) times daily.  .Marland KitchenbuPROPion (WELLBUTRIN SR) 150 MG 12 hr tablet Take 1 tablet (150 mg total) by mouth 2 (two) times daily.  . carvedilol (COREG) 25 MG tablet Take 1 tablet (25 mg total) by mouth 2 (two) times daily.  . dapagliflozin propanediol (FARXIGA) 10 MG TABS tablet Take 1 tablet (10 mg total) by mouth daily before breakfast.  . furosemide (LASIX) 20 MG tablet Take 1 tablet (20 mg total) by mouth daily.  .Marland Kitchengabapentin (NEURONTIN) 300 MG capsule Take 1 capsule (300 mg total) by mouth at bedtime.  .Marland KitchenglipiZIDE (GLUCOTROL) 10 MG tablet Take 1 tablet (10 mg total) by mouth 2 (two) times daily before a meal.  . glucose blood test strip Use as instructed  . hydrALAZINE (APRESOLINE) 50 MG tablet Take 1 tablet (50 mg total) by mouth 2 (two) times daily.  . insulin glargine (LANTUS SOLOSTAR) 100 UNIT/ML Solostar Pen Inject 38 Units into the skin daily.  . nitroGLYCERIN (NITROSTAT) 0.4 MG SL tablet Place 1 tablet (0.4 mg total) under the tongue every 5 (five) minutes as needed for chest pain.  Reported on 11/25/2015  . pantoprazole (PROTONIX) 40 MG tablet Take 1 tablet (40 mg total) by mouth daily.  . sacubitril-valsartan (ENTRESTO) 97-103 MG Take 1 tablet by mouth 2 (two) times daily.  .Marland Kitchenspironolactone (ALDACTONE) 25 MG tablet Take 1 tablet (25 mg total) by mouth 2 (two) times daily.  . ticagrelor (BRILINTA) 60 MG TABS tablet Take 1 tablet (60 mg total) by mouth 2 (two) times daily.  . TRUEPLUS LANCETS 28G MISC 28 g by Does not apply route 4 (four) times daily.     Allergies:   Patient has no known allergies.   Social History   Socioeconomic History  .  Marital status: Single    Spouse name: Not on file  . Number of children: 3  . Years of education: Not on file  . Highest education level: 11th grade  Occupational History    Employer: [employer]  Tobacco Use  . Smoking status: Current Every Day Smoker    Packs/day: 0.00    Years: 22.00    Pack years: 0.00    Types: Cigarettes  . Smokeless tobacco: Never Used  . Tobacco comment: pt states she smokes about 1-2 cigarettes per day--08/01/18  Vaping Use  . Vaping Use: Never used  Substance and Sexual Activity  . Alcohol use: Yes    Alcohol/week: 0.0 standard drinks    Comment: 07/04/2018 "only on special occasions; maybe once/yr"  . Drug use: Yes    Types: Marijuana    Comment: 02/25/2013 "quit marijuana ~ 2001"  . Sexual activity: Not Currently    Birth control/protection: Surgical  Other Topics Concern  . Not on file  Social History Narrative  . Not on file   Social Determinants of Health   Financial Resource Strain:   . Difficulty of Paying Living Expenses: Not on file  Food Insecurity:   . Worried About Charity fundraiser in the Last Year: Not on file  . Ran Out of Food in the Last Year: Not on file  Transportation Needs:   . Lack of Transportation (Medical): Not on file  . Lack of Transportation (Non-Medical): Not on file  Physical Activity:   . Days of Exercise per Week: Not on file  . Minutes of  Exercise per Session: Not on file  Stress:   . Feeling of Stress : Not on file  Social Connections:   . Frequency of Communication with Friends and Family: Not on file  . Frequency of Social Gatherings with Friends and Family: Not on file  . Attends Religious Services: Not on file  . Active Member of Clubs or Organizations: Not on file  . Attends Archivist Meetings: Not on file  . Marital Status: Not on file     Family History: The patient's family history includes Diabetes in her mother, sister, sister, son, and son.  ROS:   Please see the history of present illness.     All other systems reviewed and are negative.  EKGs/Labs/Other Studies Reviewed:    The following studies were reviewed today:  Echo 10/31/2019 1. Severe global reduction in LV systolic function; mild LVH; mild LVE;  restrictive filling; mild MR and TR; mild LAE; moderate pulmonary  hypertension.  2. Left ventricular ejection fraction, by estimation, is 20 to 25%. The  left ventricle has severely decreased function. The left ventricle  demonstrates global hypokinesis. The left ventricular internal cavity size  was mildly dilated. There is mild left  ventricular hypertrophy. Left ventricular diastolic parameters are  consistent with Grade III diastolic dysfunction (restrictive).  3. Right ventricular systolic function is normal. The right ventricular  size is normal. There is moderately elevated pulmonary artery systolic  pressure.  4. Left atrial size was mildly dilated.  5. The mitral valve is normal in structure. Mild mitral valve  regurgitation. No evidence of mitral stenosis.  6. The aortic valve is tricuspid. Aortic valve regurgitation is not  visualized. No aortic stenosis is present.  7. The inferior vena cava is normal in size with <50% respiratory  variability, suggesting right atrial pressure of 8 mmHg.   EKG:  EKG is not ordered today.  Recent Labs: 10/31/2019: TSH  1.904 11/03/2019: ALT 12; Magnesium 2.0 02/15/2020: B Natriuretic Peptide 635.5; Hemoglobin 11.1; Platelets 347 03/17/2020: BUN 9; Creatinine, Ser 0.88; Potassium 3.7; Sodium 140  Recent Lipid Panel    Component Value Date/Time   CHOL 164 07/05/2018 0330   TRIG 85 12/18/2018 0318   HDL 39 (L) 07/05/2018 0330   CHOLHDL 4.2 07/05/2018 0330   VLDL 15 07/05/2018 0330   LDLCALC 110 (H) 07/05/2018 0330    Physical Exam:    VS:  BP 120/72   Pulse 67   Temp 98.2 F (36.8 C)   Ht 5' 9"  (1.753 m)   Wt 221 lb (100.2 kg)   LMP 10/20/2015   SpO2 98%   BMI 32.64 kg/m     Wt Readings from Last 3 Encounters:  03/17/20 221 lb (100.2 kg)  02/17/20 237 lb (107.5 kg)  02/15/20 233 lb 11 oz (106 kg)     GEN:  Well nourished, well developed in no acute distress HEENT: Normal NECK: No JVD; No carotid bruits LYMPHATICS: No lymphadenopathy CARDIAC: RRR, no murmurs, rubs, gallops RESPIRATORY:  Clear to auscultation without rales, wheezing or rhonchi  ABDOMEN: Soft, non-tender, non-distended MUSCULOSKELETAL:  No edema; No deformity  SKIN: Warm and dry NEUROLOGIC:  Alert and oriented x 3 PSYCHIATRIC:  Normal affect   ASSESSMENT:    1. Chronic combined systolic (congestive) and diastolic (congestive) heart failure ([place])   2. Medication management   3. Coronary artery disease involving native coronary artery of native heart without angina pectoris   4. Uncontrolled type 2 diabetes mellitus with hyperglycemia (HCC)    PLAN:    In order of problems listed above:  1. Chronic combined systolic and diastolic heart failure: Patient appears to be euvolemic on the current therapy.  Continue carvedilol, Entresto and spironolactone.  Obtain basic metabolic panel.  Last echocardiogram shows her EF down to 25%.  I think she is on appropriate heart failure therapy at this point.  Usually we would attempted to repeat echocardiogram in 45-month however she is self-pay and does not have any insurance.  I want  to avoid unnecessary financial burdens on the patient if at all possible.  Will defer to Dr. HEllyn Hackto see if she absolutely needed a repeat echocardiogram.  2. CAD: Recent cardiac catheterization showed unchanged anatomy.  On Brilinta  3. Hyperlipidemia: Continue Lipitor  4. DM2: Uncontrolled based on recent lab work.  Hemoglobin A1c was greater than 11.  She needs close follow-up with her PCP   Medication Adjustments/Labs and Tests Ordered: Current medicines are reviewed at length with the patient today.  Concerns regarding medicines are outlined above.  Orders Placed This Encounter  Procedures  . Basic metabolic panel   No orders of the defined types were placed in this encounter.   Patient Instructions  Medication Instructions:  The current medical regimen is effective;  continue present plan and medications as directed. Please refer to the Current Medication list given to you today. *If you need a refill on your cardiac medications before your next appointment, please call your pharmacy*  Lab Work: BMET TODAY If you have labs (blood work) drawn today and your tests are completely normal, you will receive your results only by: .Marland KitchenMyChart Message (if you have MyChart) OR . A paper copy in the mail If you have any lab test that is abnormal or we need to change your treatment, we will call you to review the results.  MAKE SURE TO DISCUSS  YOUR UNCONTROLLED DIABETES WITH YOUR PCP  Follow-Up: At [facility], you and your health needs are our priority.  As part of our continuing mission to provide you with exceptional heart care, we have created designated Provider Care Teams.  These Care Teams include your primary Cardiologist (physician) and Advanced Practice Providers (APPs -  Physician Assistants and Nurse Practitioners) who all work together to provide you with the care you need, when you need it.  We recommend signing up for the patient portal called "MyChart".  Sign up  information is provided on this After Visit Summary.  MyChart is used to connect with patients for Virtual Visits (Telemedicine).  Patients are able to view lab/test results, encounter notes, upcoming appointments, etc.  Non-urgent messages can be sent to your provider as well.   To learn more about what you can do with MyChart, go to [email or web address].    Your next appointment:   KEEP SCHEDULED APPOINTMENT WITH DR [facility]   The format for your next appointment:   In Person  Provider:   Glenetta Hew, MD     Signed, Almyra Deforest, [place]  03/19/2020 11:46 PM    [place]

## 2020-03-18 LAB — BASIC METABOLIC PANEL
BUN/Creatinine Ratio: 10 (ref 9–23)
BUN: 9 mg/dL (ref 6–24)
CO2: 23 mmol/L (ref 20–29)
Calcium: 9.2 mg/dL (ref 8.7–10.2)
Chloride: 103 mmol/L (ref 96–106)
Creatinine, Ser: 0.88 mg/dL (ref 0.57–1.00)
GFR calc Af Amer: 89 mL/min/{1.73_m2} (ref >59)
GFR calc non Af Amer: 77 mL/min/{1.73_m2} (ref >59)
Glucose: 332 mg/dL — ABNORMAL HIGH (ref 65–99)
Potassium: 3.7 mmol/L (ref 3.5–5.2)
Sodium: 140 mmol/L (ref 134–144)

## 2020-03-19 ENCOUNTER — Encounter: Payer: Self-pay | Admitting: Physician Assistant

## 2020-03-24 MED FILL — GABAPENTIN 300 MG CAPSULE: 300 | 30 days supply | Qty: 30 | Fill #3

## 2020-03-24 MED FILL — FARXIGA 10 MG TABLET: 10 | 30 days supply | Qty: 30 | Fill #1

## 2020-03-25 ENCOUNTER — Other Ambulatory Visit: Payer: Self-pay

## 2020-03-25 ENCOUNTER — Ambulatory Visit: Payer: Self-pay | Attending: Family Medicine | Admitting: Family Medicine

## 2020-03-25 ENCOUNTER — Encounter: Payer: Self-pay | Admitting: Family Medicine

## 2020-03-25 VITALS — BP 135/74 | HR 64 | Temp 98.4°F

## 2020-03-25 DIAGNOSIS — F329 Major depressive disorder, single episode, unspecified: Secondary | ICD-10-CM | POA: Insufficient documentation

## 2020-03-25 DIAGNOSIS — R42 Dizziness and giddiness: Secondary | ICD-10-CM | POA: Insufficient documentation

## 2020-03-25 DIAGNOSIS — I255 Ischemic cardiomyopathy: Secondary | ICD-10-CM | POA: Insufficient documentation

## 2020-03-25 DIAGNOSIS — I2582 Chronic total occlusion of coronary artery: Secondary | ICD-10-CM | POA: Insufficient documentation

## 2020-03-25 DIAGNOSIS — Z7901 Long term (current) use of anticoagulants: Secondary | ICD-10-CM | POA: Insufficient documentation

## 2020-03-25 DIAGNOSIS — I252 Old myocardial infarction: Secondary | ICD-10-CM | POA: Insufficient documentation

## 2020-03-25 DIAGNOSIS — E1165 Type 2 diabetes mellitus with hyperglycemia: Secondary | ICD-10-CM | POA: Insufficient documentation

## 2020-03-25 DIAGNOSIS — T675XXA Heat exhaustion, unspecified, initial encounter: Secondary | ICD-10-CM

## 2020-03-25 DIAGNOSIS — I110 Hypertensive heart disease with heart failure: Secondary | ICD-10-CM | POA: Insufficient documentation

## 2020-03-25 DIAGNOSIS — Z794 Long term (current) use of insulin: Secondary | ICD-10-CM | POA: Insufficient documentation

## 2020-03-25 DIAGNOSIS — Z955 Presence of coronary angioplasty implant and graft: Secondary | ICD-10-CM | POA: Insufficient documentation

## 2020-03-25 DIAGNOSIS — Z833 Family history of diabetes mellitus: Secondary | ICD-10-CM | POA: Insufficient documentation

## 2020-03-25 DIAGNOSIS — Z79899 Other long term (current) drug therapy: Secondary | ICD-10-CM | POA: Insufficient documentation

## 2020-03-25 DIAGNOSIS — I25110 Atherosclerotic heart disease of native coronary artery with unstable angina pectoris: Secondary | ICD-10-CM | POA: Insufficient documentation

## 2020-03-25 DIAGNOSIS — I5042 Chronic combined systolic (congestive) and diastolic (congestive) heart failure: Secondary | ICD-10-CM | POA: Insufficient documentation

## 2020-03-25 DIAGNOSIS — R55 Syncope and collapse: Secondary | ICD-10-CM | POA: Insufficient documentation

## 2020-03-25 DIAGNOSIS — E785 Hyperlipidemia, unspecified: Secondary | ICD-10-CM | POA: Insufficient documentation

## 2020-03-25 LAB — GLUCOSE, POCT (MANUAL RESULT ENTRY): POC Glucose: 355 mg/dl — AB (ref 70–99)

## 2020-03-25 NOTE — Progress Notes (Signed)
Patient arrived at clinic to obtain medications.  Patient stated that she felt that she was going to pass out.
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
## 2020-04-02 MED FILL — ?SPIRONOLACTONE 25 MG TABLE: 25 | 30 days supply | Qty: 60 | Fill #3

## 2020-04-02 MED FILL — BRILINTA 60 MG TABLET: 60 | 30 days supply | Qty: 60 | Fill #7

## 2020-04-09 MED FILL — glipiZIDE 10 MG TABS: 10 | 30 days supply | Qty: 60 | Fill #2

## 2020-04-09 MED FILL — ATORVASTATIN CALCIUM 80 MG: 80 | 30 days supply | Qty: 30 | Fill #1

## 2020-04-09 MED FILL — CARVEDILOL 25 MG TABLET: 25 | 30 days supply | Qty: 60 | Fill #2

## 2020-04-16 MED FILL — PANTOPRAZOLE SOD DR 40 MG T: 40 | 30 days supply | Qty: 30 | Fill #2

## 2020-04-16 MED FILL — TRUE METRIX TEST STRIP: 25 days supply | Qty: 100 | Fill #1

## 2020-04-16 MED FILL — hydrALAZINE HCL 50 MG TABS: 50 | 30 days supply | Qty: 60 | Fill #2

## 2020-04-27 ENCOUNTER — Ambulatory Visit: Payer: Medicaid Other | Admitting: Family Medicine

## 2020-04-28 MED FILL — GABAPENTIN 300 MG CAPSULE: 300 | 30 days supply | Qty: 30 | Fill #0

## 2020-04-28 MED FILL — !FARXIGA 10MG TABLET: 10 | 30 days supply | Qty: 30 | Fill #2

## 2020-05-03 MED FILL — $BRILINTA 60 MG TABLET: 60 | 90 days supply | Qty: 180 | Fill #8

## 2020-05-03 MED FILL — ATORVASTATIN CALCIUM 80 MG: 80 | 30 days supply | Qty: 30 | Fill #2

## 2020-05-03 MED FILL — SPIRONOLACTONE 25 MG TABLET: 25 | 30 days supply | Qty: 60 | Fill #4

## 2020-05-12 ENCOUNTER — Ambulatory Visit: Payer: Medicaid Other | Admitting: Cardiology

## 2020-05-19 MED FILL — hydrALAZINE HCL 50 MG TABS: 50 | 30 days supply | Qty: 60 | Fill #3

## 2020-05-26 MED FILL — ATORVASTATIN CALCIUM 80 MG: 80 | 30 days supply | Qty: 30 | Fill #3

## 2020-05-26 MED FILL — !FARXIGA 10MG TABLET: 10 | 30 days supply | Qty: 30 | Fill #3

## 2020-05-26 MED FILL — PANTOPRAZOLE SOD DR 40 MG T: 40 | 30 days supply | Qty: 30 | Fill #3

## 2020-06-01 ENCOUNTER — Ambulatory Visit (INDEPENDENT_AMBULATORY_CARE_PROVIDER_SITE_OTHER): Payer: Self-pay | Admitting: Cardiology

## 2020-06-01 ENCOUNTER — Other Ambulatory Visit: Payer: Self-pay

## 2020-06-01 ENCOUNTER — Encounter: Payer: Self-pay | Admitting: Cardiology

## 2020-06-01 ENCOUNTER — Other Ambulatory Visit: Payer: Self-pay | Admitting: Cardiology

## 2020-06-01 VITALS — BP 148/84 | HR 70 | Ht 69.0 in | Wt 231.4 lb

## 2020-06-01 DIAGNOSIS — I2510 Atherosclerotic heart disease of native coronary artery without angina pectoris: Secondary | ICD-10-CM

## 2020-06-01 DIAGNOSIS — I5042 Chronic combined systolic (congestive) and diastolic (congestive) heart failure: Secondary | ICD-10-CM

## 2020-06-01 DIAGNOSIS — E1165 Type 2 diabetes mellitus with hyperglycemia: Secondary | ICD-10-CM

## 2020-06-01 DIAGNOSIS — E785 Hyperlipidemia, unspecified: Secondary | ICD-10-CM

## 2020-06-01 DIAGNOSIS — Z9119 Patient's noncompliance with other medical treatment and regimen: Secondary | ICD-10-CM

## 2020-06-01 DIAGNOSIS — I110 Hypertensive heart disease with heart failure: Secondary | ICD-10-CM

## 2020-06-01 DIAGNOSIS — Z91199 Patient's noncompliance with other medical treatment and regimen due to unspecified reason: Secondary | ICD-10-CM

## 2020-06-01 DIAGNOSIS — I10 Essential (primary) hypertension: Secondary | ICD-10-CM

## 2020-06-01 DIAGNOSIS — F17200 Nicotine dependence, unspecified, uncomplicated: Secondary | ICD-10-CM

## 2020-06-01 MED ORDER — FUROSEMIDE 20 MG PO TABS: ORAL_TABLET | ORAL | 3 refills | Status: DC

## 2020-06-01 MED ORDER — HYDRALAZINE HCL 50 MG PO TABS: ORAL_TABLET | ORAL | 3 refills | Status: DC

## 2020-06-01 MED FILL — FUROSEMIDE 20 MG TABS: 20 | 30 days supply | Qty: 90 | Fill #0

## 2020-06-01 MED FILL — hydrALAZINE HCL 50 MG TABS: 50 | 30 days supply | Qty: 90 | Fill #0

## 2020-06-01 NOTE — Progress Notes (Signed)
Primary Care Provider: [name], MD Cardiologist: Amy Hew, MD Electrophysiologist: None  Clinic Note: Chief Complaint  Patient presents with  . Follow-up    1-month . Cardiomyopathy    Stable medications  . Congestive Heart Failure    Feels pretty good today  . Coronary Artery Disease    No angina   HPI:    Amy HAWKEYis a 49y.o. 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  Cardiac History:  February 25, 2013: Progressive angina with abnormal Myoview -> DES PCI of p-mRCA 2 overlapping DES (Promus 3.0 x 38 & 3.5 x 26 - 3.75-3.6 mm) - Dr. GaEinar Chaney  04/01/2013: Inf NSTEMI -> (pt had stopped Effient 4 d prior to episode) - 100% subacute stent thrombosis -> additional Promus 3 x 20 stent (distal overlap).  09/2017: Ant STEMI - LAD-D2 bifurcation (lost D2 after DK crush - unable to rewire)  06/2018: Ant STEM: subacute Stent thrombosis - DES PCI- distal overlap.   EF 35-40%.  Prior Auth for entresto (Dec 2020)  12/18/2018: Flash Pulm Edema - Acute CHF decompensation, HTN Urgency (SBP 240 mmHg) --> EF 20-25%, Takotsubo CM (related to Son's death). --> finally started Entresto, Carvedilol & Spironolactone.  01/2019: Delene Lollncreased to 49/51 mg.   Echo 02/14/19: Moderate LVH.  EF improved up to 45-50%.  GR 1 DD.  Severe apical akinesis.  Normal valves.  4/2-12/2018: Admitted with acute hypoxic respiratory failure, flash pulmonary edema.  She is unresponsive approximately.  Had previously noted some left-sided chest, radiated to left arm.  She left the ER several days prior to her original presentation due to long wait.  This came in via EMS.  SPO2 90%, BP 190/83.  CXR with bilateral pulmonary edema.  ABG with significant hypercarbic  respiratory acidosis => Intubated.  Diuresed.  R&LHC -stable with no new disease.  Question medication adherence and blood.  Follow-up February 10, 2019: Noted that despite increasing Entresto and carvedilol, BP remained elevated.  Hydralazine was started to 50 mg twice daily.  --> Most recently ER visit February 15, 2020  Problem List Items Addressed This Visit    Coronary artery disease involving native coronary artery of native heart without angina pectoris (Chronic)   Essential hypertension (Chronic)   Chronic combined systolic (congestive) and diastolic (congestive) heart failure (HCC) - Primary (Chronic)   Smoker (Chronic)   Dyslipidemia, goal LDL below 70 (Chronic)     Recent Hospitalizations:   February 15, 2020: Acute on chronic CHF; presented to ER with orthopnea PND with coughing.  Leg edema.  Apparently Lasix was stopped 3 weeks prior.  AmGLENORA MOROCHOas last seen on March 17, 2020 by HaAlmyra DeforestPA: He did did addition to the recent ER visit, her A1c on July 20 was 11.3. -> She denied significant dyspnea other than occasional orthopnea.  No edema lungs were clear, seem to be bulimic.  No weight loss.  Blood pressure 120/72.  BMP checked.  Echo not ordered-cost roughly $800 in the evening and $1800 in [employer] Reviewed  CV studies:    The following studies were reviewed today: (if available, images/films reviewed: From Epic Chart or Care Everywhere) . TTE 10/31/2019: EF 20 to 25%.  Global HK.  Mildly dilated LV.  Mildly elevated  PA pressures.  Mild LA dilation.  Mild AR.  Mildly elevated RAP-8 mmHg. . Right and Left Heart Cath 11/04/2019: Mildly elevated LVEDP.  Ostial D1 100%.  Proximal to mid LAD stent 10% ISR.  Mid and distal stent widely patent.  30% distal distance.  Ostial OM 2 55%.  Proximal to mid RCA stent 50% stenosis.  Mid to distal RCA 40%.  Suspect that reduced EF is related to hypertensive emergency.:  Interval History:   Amy Chaney returns  here today for 3-month follow-up overall doing fairly well.  Sometimes her legs feel little weak.  She also says that she will have off-and-on spells of chest discomfort when she first gets up from sleep.  It usually happens when she has not had a good night sleep.  Otherwise no real exertional chest discomfort. She will get short of breath if she has to walk uphill or carrying something up steps.  But not with routine walking.  She notes occasional palpitations usually they occur in the evening when she is try to relax.  These occur maybe once a week and last maybe 5 to 10 minutes with increased heart rate.  She does not really get lightheaded or dizzy just feels tired.  Tells me her blood pressures at home are in the 130/80 range.  Today's pressures are strange, but she has actually has not taken her medications yet.    CV Review of Symptoms (Summary): positive for - Occasional sharp chest discomfort from first working up, but not with exertion.  Exertional dyspnea with increased exertion-carrying something upstairs; brief episodes of fast heart rate not associated with dizziness or lightheadedness. negative for - edema, orthopnea, paroxysmal nocturnal dyspnea, shortness of breath or No syncope or near syncope, TIA or amaurosis fugax.. Claudication  The patient does not have symptoms concerning for COVID-19 infection (fever, chills, cough, or new shortness of breath).   REVIEWED OF SYSTEMS   Review of Systems  Constitutional: Negative for malaise/fatigue (Not really full of energy, but not really fatigued) and weight loss (She gained back some of the weight she lost).  HENT: Negative for congestion.   Respiratory: Positive for shortness of breath (Per HPI). Negative for cough.   Cardiovascular: Negative for leg swelling (Not really).  Gastrointestinal: Negative for abdominal pain, blood in stool, constipation and melena.  Genitourinary: Negative for hematuria.  Musculoskeletal: Negative for  joint pain.  Neurological: Positive for dizziness (Per HPI) and headaches (When her blood pressure is high). Negative for focal weakness and weakness.  Psychiatric/Behavioral: Negative for memory loss. The patient is nervous/anxious and has insomnia (Off and on.).    I have reviewed and (if needed) personally updated the patient's problem list, medications, allergies, past medical and surgical history, social and family history.   PAST MEDICAL HISTORY   Past Medical History:  Diagnosis Date  . Coronary artery disease involving native coronary artery of native heart with angina pectoris (HCC) 01/2013   a. s/p PCI of the mid RCA 01/2013 //  b. LHC 9/14: EF 55%, RCA stent 100% occluded, LAD irregularities >>  subacute stent thrombosis, Promus DES PCI distal overlap // c) 09/2017 - Ant STEMI - LAD-D2 PCI - Successful PCI of LAD (3 overlapping DES), unable to restore flow down D2l that was stented as well.  Likely related to downstream dissection, but unable to rewire.  . Daily headache   . Depression   . Dyslipidemia, goal LDL below 70   . History of Doppler ultrasound      carotid bruit >> a. Carotid US 6/17: bilat ICA 1-39%  . History of Takotsubo cardiomyopathy 11/2018   Admitted for acute on chronic combined systolic and diastolic heart failure.  EF down to 20-25% global hypokinesis.  Was in the setting of grieving over the loss of her son from MI.  ->  EF improved to 45-50% on follow-up echocardiogram.  . Hypertension   . Ischemic cardiomyopathy 06/2018   s/p inferior STEMI followed by 2 anterior STEMIs: Following recovery from Takotsubo Cardiomyopathy- Echo 02/14/19: Moderate LVH.  EF improved up to 45-50%.  GR 1 DD.  Severe apical akinesis.  Normal valves.  . Mild dysplasia of cervix (CIN I) 02/04/2019   Seen after colposcopy on 01/23/19 done for ASCU +HPV pap  > repeat pap and HPV test in 12 and 24 months  . STEMI involving left anterior descending coronary artery ([place]) 09/2017; 06/2018   a)  Severe [place] 1, 1, 1 LAD-D2 lesion (complicated by post PTCA dissection/intramural hematoma) -successful extensive PCI of the LAD but unable to maintain patency of the stented D2. b) distal mLAD stent Edge 100% thrombosis --> PTCA & Overlapping DES PCI (Synergy 3 x 12 --> 3.6-3.3 mm). D2 occluded. RCA stents patent, 65 % OM2. EF 30-35%.  Marland Kitchen STEMI involving oth coronary artery of inferior wall ([place]) 03/2013   Secondary to subacute stent thrombosis of RCA stent segment having missed 4 doses of Effient.  . Tobacco abuse 01/11/2016  . Type II diabetes mellitus ([place]) 12/2010   sister and son also diabetic     PAST SURGICAL HISTORY   Past Surgical History:  Procedure Laterality Date  . CHOLECYSTECTOMY  ~ 2000  . CORONARY STENT INTERVENTION N/A 10/10/2017   Procedure: CORONARY STENT INTERVENTION;  Surgeon: Leonie Man, MD;  Location: [place] [facility];  Service: Cardiovascular; LAD PCI: STENT SYNERGY DES 3.5X32 (p),STENT SYNERGY DES 3.5X 8 (m), STENT SYNERGY DES 3.5X28 (d).  D2 PCI: STENT SYNERGY DES 2.5X24.  (UNABL TO REWIRE & EXPAND - TO OF DIAG AT END OF CASE)  . CORONARY STENT INTERVENTION N/A 07/03/2018   Procedure: CORONARY STENT INTERVENTION;  Surgeon: Leonie Man, MD;  Location: [facility];; PTCA & overlapping DES PCI (overlaps prior stents distally) - Synergy DES 3 x 12 (3.6 mm @ overlap -- 3.3 mm distally)>  . LEFT HEART CATH AND CORONARY ANGIOGRAPHY N/A 10/10/2017   Procedure: LEFT HEART CATH AND CORONARY ANGIOGRAPHY;  Surgeon: Leonie Man, MD;  Location: [place] [facility];  Service: Cardiovascular;  Laterality: N/A; -patent RCA stents with mild in-stent restenosis. [place] 1,1,1 mLD-D2 48% (complicated by dissection/intramural thrombus)  . LEFT HEART CATH AND CORONARY ANGIOGRAPHY N/A 07/03/2018   Procedure: LEFT HEART CATH AND CORONARY ANGIOGRAPHY;  Surgeon: Leonie Man, MD;  Location: [place] [facility];; 100% thrombotic occlusion distal LAD stent -- DES PCI.  continued 100% D2 (previously lost). patent RCA stents, 65% OM2.  EF 30-35%.  Marland Kitchen LEFT HEART CATHETERIZATION WITH CORONARY ANGIOGRAM N/A 02/25/2013   Procedure: LEFT HEART CATHETERIZATION WITH CORONARY ANGIOGRAM;  Surgeon: Laverda Page, MD;  Location: [facility];  Service: Cardiovascular;; CTO m RCA; otherwise normal coronaries  . LEFT HEART CATHETERIZATION WITH CORONARY ANGIOGRAM N/A 04/01/2013   Procedure: LEFT HEART CATHETERIZATION WITH CORONARY ANGIOGRAM;  Surgeon: Laverda Page, MD;  Location: [facility] C[facility];  Service: Cardiovascular: 100% occlusion of distal RCA stent (subacute stent thrombosis -  secondary to stopping Effient).  Overlapping DES PCI  . NM MYOVIEW  LTD  12/2015    EF 38%.  Hypertensive response to exercise (231/132 mmHg).  INTERMEDIATE RISK due to -diffuse hypokinesis and reduced EF.  No ischemia or infarction.  . PERCUTANEOUS CORONARY STENT INTERVENTION (PCI-S)  04/01/2013   Procedure: PERCUTANEOUS CORONARY STENT INTERVENTION (PCI-S);  Surgeon: Jagadeesh R Ganji, MD;  Location: [facility];  Service: Cardiovascular;; PCI of distal RCA stent subacute thrombosis: Promus Premier DES 3.0 mm x 20 mm.  . PERCUTANEOUS CORONARY STENT INTERVENTION (PCI-S)  02/25/2013   Procedure: PERCUTANEOUS CORONARY STENT INTERVENTION (PCI-S);  Surgeon: Jagadeesh R Ganji, MD;  Location: [facility];  RCA CTO PCI with overlapping Promus DES: 3.0 mm x 38 mm, 3.5 mm x 16mm  . RIGHT/LEFT HEART CATH AND CORONARY ANGIOGRAPHY N/A 11/04/2019   Procedure: RIGHT/LEFT HEART CATH AND CORONARY ANGIOGRAPHY;  Surgeon: Harding, David W, MD;  Location: [facility];; Mildly elevated LVEDP.  Ostial D1 100%.  Proximal to mid LAD stent 10% ISR.  Mid and distal stent widely patent.  30% distal distance.  Ostial OM 2 55%.  Proximal to mid RCA stent 50% stenosis.  Mid to distal RCA 40%.  Suspect that reduced EF is related to hypertensive emergen  . TRANSTHORACIC ECHOCARDIOGRAM  07/04/2018   recurrent Anterior STEMI: EF  35-40%.  Apical hypokinesis.  GRII DD.  No thrombus noted.  (Improved EF from 30% up to 35-40% from previous echo)  . TRANSTHORACIC ECHOCARDIOGRAM  11/2018; 01/2019   a) TAKOTUSBO CM: EF 20-25%-diffuse HK..  Mod V thickness.  GRII DD w/ high LVEDP.  Severe LA dilation;; b) 02/14/19: Moderate LVH.  EF improved up to 45-50%.  GR 1 DD.  Severe apical akinesis.  Normal valves.  . TRANSTHORACIC ECHOCARDIOGRAM  10/31/2019   Hypertensive emergency, hypoxic/hypercapnic respiratory failure;  EF 20 to 25%.  Global HK.  Mildly dilated LV.  Mildly elevated PA pressures.  Mild LA dilation.  Mild AR.  Mildly elevated RAP-8 mmHg.=> Eventual cath showed no change  . TUBAL LIGATION  1994    Immunization History  Administered Date(s) Administered  . Pneumococcal Polysaccharide-23 02/26/2013  . Tdap 08/21/2018    MEDICATIONS/ALLERGIES   Current Meds  Medication Sig  . atorvastatin (LIPITOR) 80 MG tablet Take 1 tablet (80 mg total) by mouth daily.  . blood glucose meter kit and supplies Dispense based on patient and insurance preference. Use up to four times daily as directed. (FOR ICD-10 E10.9, E11.9).  . Blood Glucose Monitoring Suppl (TRUE METRIX METER) DEVI 1 kit by Does not apply route 4 (four) times daily.  . buPROPion (WELLBUTRIN SR) 150 MG 12 hr tablet Take 1 tablet (150 mg total) by mouth 2 (two) times daily.  . carvedilol (COREG) 25 MG tablet Take 1 tablet (25 mg total) by mouth 2 (two) times daily.  . dapagliflozin propanediol (FARXIGA) 10 MG TABS tablet Take 1 tablet (10 mg total) by mouth daily before breakfast.  . furosemide (LASIX) 20 MG tablet Take 20mg tablet by mouth daily and may take an additional 20mg- 40mg if weight is more than 3 to 5lbs.  . gabapentin (NEURONTIN) 300 MG capsule Take 1 capsule (300 mg total) by mouth at bedtime.  . glipiZIDE (GLUCOTROL) 10 MG tablet Take 1 tablet (10 mg total) by mouth 2 (two) times daily before a meal.  . glucose blood test strip Use as instructed  .  insulin glargine (LANTUS SOLOSTAR) 100 UNIT/ML Solostar Pen Inject 38 Units into the skin daily.  . nitroGLYCERIN (NITROSTAT) 0.4 MG SL   tablet Place 1 tablet (0.4 mg total) under the tongue every 5 (five) minutes as needed for chest pain. Reported on 11/25/2015  . pantoprazole (PROTONIX) 40 MG tablet Take 1 tablet (40 mg total) by mouth daily.  . sacubitril-valsartan (ENTRESTO) 97-103 MG Take 1 tablet by mouth 2 (two) times daily.  . spironolactone (ALDACTONE) 25 MG tablet Take 1 tablet (25 mg total) by mouth 2 (two) times daily.  . ticagrelor (BRILINTA) 60 MG TABS tablet Take 1 tablet (60 mg total) by mouth 2 (two) times daily.  . TRUEPLUS LANCETS 28G MISC 28 g by Does not apply route 4 (four) times daily.  . [DISCONTINUED] furosemide (LASIX) 20 MG tablet Take 1 tablet (20 mg total) by mouth daily.  . [DISCONTINUED] furosemide (LASIX) 20 MG tablet Take 20mg tablet by mouth daily and may take an additional 20mg- 40mg if weight is more than 3 to 5lbs.    No Known Allergies  SOCIAL HISTORY/FAMILY HISTORY   Reviewed in Epic:  Pertinent findings: Still has difficulty at present does not have insurance.  Is trying to work on getting something assistance.  She is in need of social work counseling.  OBJCTIVE -PE, EKG, labs   Wt Readings from Last 3 Encounters:  06/01/20 231 lb 6.4 oz (105 kg)  03/17/20 221 lb (100.2 kg)  02/17/20 237 lb (107.5 kg)    Physical Exam: BP (!) 148/84   Pulse 70   Ht 5' 9" (1.753 m)   Wt 231 lb 6.4 oz (105 kg)   LMP 10/20/2015   SpO2 99%   BMI 34.17 kg/m  Physical Exam Vitals reviewed.  Constitutional:      General: She is not in acute distress.    Appearance: Normal appearance. She is obese. She is ill-appearing (Somewhat ill-appearing, but nontoxic).     Comments: Well-groomed.  Well-nourished  HENT:     Head: Normocephalic and atraumatic.  Neck:     Vascular: Hepatojugular reflux (Trivial) present. No carotid bruit or JVD.  Cardiovascular:     Rate  and Rhythm: Normal rate and regular rhythm. Occasional extrasystoles are present.    Chest Wall: PMI is not displaced (Unable to palpate).     Pulses: Decreased pulses (Mildly decreased pedal pulses.).     Heart sounds: S1 normal and S2 normal. Heart sounds are distant. No murmur heard.  No friction rub. Gallop present. S4 sounds present.   Pulmonary:     Effort: Pulmonary effort is normal. No respiratory distress.     Breath sounds: Normal breath sounds.  Chest:     Chest wall: No tenderness.  Musculoskeletal:        General: Swelling (Trace-1+ ankle, bilateral) present. No tenderness. Normal range of motion.     Cervical back: Normal range of motion and neck supple.  Skin:    General: Skin is warm and dry.  Neurological:     General: No focal deficit present.     Mental Status: She is alert and oriented to person, place, and time. Mental status is at baseline.     Cranial Nerves: No cranial nerve deficit.  Psychiatric:        Mood and Affect: Mood normal.        Behavior: Behavior normal.        Thought Content: Thought content normal.        Judgment: Judgment normal.     Adult ECG Report N/a   Recent Labs:  --> Has not had lipids checked in almost   2 years Lab Results  Component Value Date   CHOL 164 07/05/2018   HDL 39 (L) 07/05/2018   LDLCALC 110 (H) 07/05/2018   TRIG 85 12/18/2018   CHOLHDL 4.2 07/05/2018   Lab Results  Component Value Date   CREATININE 0.88 03/17/2020   BUN 9 03/17/2020   NA 140 03/17/2020   K 3.7 03/17/2020   CL 103 03/17/2020   CO2 23 03/17/2020   Lab Results  Component Value Date   TSH 1.904 10/31/2019    ASSESSMENT/PLAN    Problem List Items Addressed This Visit    Hypertensive heart disease with chronic combined systolic and diastolic congestive heart failure (HCC) (Chronic)    More than just hypertension, she is significant) with episodes of hypertensive crisis leading to exacerbation of CHF/Takotsubo cardiomyopathy but flash  pulmonary edema.  Her blood pressure has been very difficult control.  Today is a little high, but she tells me at home it is much better.   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  Seems like her blood pressures are finally stable (although high today since she has not yet taken her medications) -> I asked her to continue to follow her blood pressures at home.  I want her to take an additional dose of hydralazine for systolic pressure greater than 150 mmHg.  She will recheck her pressures when she gets home today and if still elevated will take the extra dose.      Relevant Medications   hydrALAZINE (APRESOLINE) 50 MG tablet   furosemide (LASIX) 20 MG tablet   Other Relevant Orders   Comprehensive metabolic panel   CBC   Lipid panel   Coronary artery disease involving native coronary artery of native heart without angina pectoris (Chronic)    Last cath this April showed patent stents.  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  She is not actively having any anginal symptoms.  She does have microvascular ischemic symptoms when her pressures go higher or she becomes hypervolemic. ->  Gust the importance of aggressively following her blood pressures and taking the hydralazine as needed for high blood pressures.  She could also take extra dose of Lasix.  Plan:   Continue carvedilol along with other CHF medications including Entresto, and hydralazine  Continue atorvastatin, but she needs to take it once daily twice daily.  Continue Farxiga.  On maintenance dose Brilinta 60 mg twice daily -> if this becomes a financial difficulty, would switch to Plavix 75 mg daily, but continue lifelong.  Okay to hold 5 days preop for procedures or surgeries.       Relevant Medications   hydrALAZINE  (APRESOLINE) 50 MG tablet   furosemide (LASIX) 20 MG tablet   Other Relevant Orders   Comprehensive metabolic panel   CBC   Lipid panel   H/O noncompliance with medical treatment, presenting hazards to health (Chronic)    We reiterated the importance of staying with the medication, not missing doses.  Using the as needed hydralazine if indicated, and taking extra Lasix if her edema gets worse.  She gets in trouble but does not take medications correctly.      Diabetes type 2, uncontrolled (HCC) (Chronic)    Needs to follow-up with PCP.  Is now on Farxiga along with glipizide.        Chronic combined systolic (congestive) and diastolic (congestive) heart failure (HCC) - Primary (Chronic)    Her EF is definitely went down this April with hypertensive emergency.  Similar to what happened in May 2020.  Blood pressure go up really high, which he had the volume overload with pulmonary edema.  Plan:   Continue carvedilol 25 mg twice daily, Entresto 97-1 3 mg twice daily, along with spironolactone 20 mg twice daily..  She is  Continue Farxiga along with standing dose of Lasix.  Discussed with standing dose Lasix plus sliding scale for weight gain.  Continue recently added hydralazine 50 mg twice daily-I discussed PRN dosing for SBP> 150 mmHg  The more doses of hydralazine she has to take as needed, we may need to just titrate to 100 mg twice daily, and potentially add Isordil.      Relevant Medications   hydrALAZINE (APRESOLINE) 50 MG tablet   furosemide (LASIX) 20 MG tablet   Other Relevant Orders   Comprehensive metabolic panel   CBC   Lipid panel   Smoker (Chronic)    I spent about 4 minutes talking her about the need to quit smoking.  We talked about trying to cut down all the way.  She is not ready to make the final step.  Unfortunately she probably cannot afford options such as Wellbutrin or Chantix. Talk about maybe using Nicorette gum (it is probably still less expensive than  cigarettes.)      Relevant Orders   Comprehensive metabolic panel   CBC   Lipid panel   Dyslipidemia, goal LDL below 70 (Chronic)    She has not had lipids checked in 2 years.  Was not at goal in 2019.  She has been taking atorvastatin 80 mg twice daily, I think this was not intentional.  Plan:   Continue atorvastatin 80 mg daily.  Check fasting lipid panel along with CMP.  (Also check A1c with her diabetes.)       Relevant Medications   hydrALAZINE (APRESOLINE) 50 MG tablet   furosemide (LASIX) 20 MG tablet   Other Relevant Orders   Comprehensive metabolic panel   CBC   Lipid panel      COVID-19 Education: The signs and symptoms of COVID-19 were discussed with the patient and how to seek care for testing (follow up with PCP or arrange E-visit).   The importance of social distancing and COVID-19 vaccination was discussed today. The patient is practicing social distancing & Masking.   I spent a total of 30minutes with the patient spent in direct patient consultation.  Additional time spent with chart review  / charting (studies, outside notes, etc): 20 Total Time: 50 min   Current medicines are reviewed at length with the patient today.  (+/- concerns) --> taking atorvastatin twice daily  This visit occurred during the SARS-CoV-2 public health emergency.  Safety protocols were in place, including screening questions prior to the visit, additional usage of staff PPE, and extensive cleaning of exam room while observing appropriate contact time as indicated for disinfecting solutions.  Notice: This dictation was prepared with Dragon dictation along with smaller phrase technology. Any transcriptional errors that result from this process are unintentional and may not be corrected upon review.  Patient Instructions / Medication Changes & Studies & Tests Ordered   Patient Instructions  Medication Instruction Continue taking hydralazine 50mg twice daily.  May take an additional  tablet (50mg) if blood pressure in greater than 150mmHg systolic (top number). Check blood pressure   as often as possible.  Sliding scale Lasix: Weigh yourself when you get home, then Daily in the Morning. Your dry weight will be what your scale says on the day you return home.(here is 231lbs.).   If you gain more than 3 pounds from dry weight: Increase the Lasix dosing to 71m in the morning and until weight returns to baseline dry weight.  If weight gain is greater than 5 pounds in 2 days: Increased to Lasix 432min the morning and 20102mn the evening and contact the office for further assistance if weight does not go down the next day.  If the weight goes down more than 3 pounds from dry weight: Hold Lasix until it returns to baseline dry weight  Only take Atorvastatin 74m100mce daily.   *If you need a refill on your cardiac medications before your next appointment, please call your pharmacy*   Lab Work: Your physician recommends that you return for lab work in: CMET, CBC, Lipids in a fasting state.  If you have labs (blood work) drawn today and your tests are completely normal, you will receive your results only by: . MyMarland Kitchenhart Message (if you have MyChart) OR . A paper copy in the mail If you have any lab test that is abnormal or we need to change your treatment, we will call you to review the results.  Follow-Up: [facility] and your health needs are our priority.  As part of our continuing mission to provide you with exceptional heart care, we have created designated Provider Care Teams.  These Care Teams include your primary Cardiologist (physician) and Advanced Practice Providers (APPs -  Physician Assistants and Nurse Practitioners) who all work together to provide you with the care you need, when you need it.  We recommend signing up for the patient portal called "MyChart".  Sign up information is provided on this After Visit Summary.  MyChart is used to connect with  patients for Virtual Visits (Telemedicine).  Patients are able to view lab/test results, encounter notes, upcoming appointments, etc.  Non-urgent messages can be sent to your provider as well.   To learn more about what you can do with MyChart, go to [email or web address] Your next appointment:   4 month(s)  The format for your next appointment:   In Person  Provider:   You will see one of the following Advanced Practice Providers on your designated Care Team:    [facility] Then, DaviGlenetta Chaney will plan to see you again in 8 month(s).   Other Instructions     Studies Ordered:   Orders Placed This Encounter  Procedures  . Comprehensive metabolic panel  . CBC  . Lipid panel     DaviGlenetta HewD., M.S. Interventional Cardiologist   Pager # [phone] # [phone] [address] [phone] you for choosing Heart[facility] at [facility]

## 2020-06-01 NOTE — Patient Instructions (Addendum)
Medication Instruction Continue taking hydralazine 50mg  twice daily.  May take an additional tablet (50mg ) if blood pressure in greater than 710GYIR systolic (top number). Check blood pressure as often as possible.  Sliding scale Lasix: Weigh yourself when you get home, then Daily in the Morning. Your dry weight will be what your scale says on the day you return home.(here is 231lbs.).   If you gain more than 3 pounds from dry weight: Increase the Lasix dosing to 40mg  in the morning and until weight returns to baseline dry weight.  If weight gain is greater than 5 pounds in 2 days: Increased to Lasix 40mg  in the morning and 20mg  in the evening and contact the office for further assistance if weight does not go down the next day.  If the weight goes down more than 3 pounds from dry weight: Hold Lasix until it returns to baseline dry weight  Only take Atorvastatin 80mg  once daily.   *If you need a refill on your cardiac medications before your next appointment, please call your pharmacy*   Lab Work: Your physician recommends that you return for lab work in: CMET, CBC, Lipids in a fasting state.  If you have labs (blood work) drawn today and your tests are completely normal, you will receive your results only by: Marland Kitchen MyChart Message (if you have MyChart) OR . A paper copy in the mail If you have any lab test that is abnormal or we need to change your treatment, we will call you to review the results.  Follow-Up: At [facility], you and your health needs are our priority.  As part of our continuing mission to provide you with exceptional heart care, we have created designated Provider Care Teams.  These Care Teams include your primary Cardiologist (physician) and Advanced Practice Providers (APPs -  Physician Assistants and Nurse Practitioners) who all work together to provide you with the care you need, when you need it.  We recommend signing up for the patient portal called "MyChart".  Sign  up information is provided on this After Visit Summary.  MyChart is used to connect with patients for Virtual Visits (Telemedicine).  Patients are able to view lab/test results, encounter notes, upcoming appointments, etc.  Non-urgent messages can be sent to your provider as well.   To learn more about what you can do with MyChart, go to [email or web address].    Your next appointment:   4 month(s)  The format for your next appointment:   In Person  Provider:   You will see one of the following Advanced Practice Providers on your designated Care Team:    HAO [facility] PA  Then, Glenetta Hew, MD will plan to see you again in 8 month(s).   Other Instructions

## 2020-06-07 MED FILL — glipiZIDE 10 MG TABS: 10 | 30 days supply | Qty: 60 | Fill #3

## 2020-06-07 MED FILL — CARVEDILOL 25 MG TABLET: 25 | 30 days supply | Qty: 60 | Fill #3

## 2020-06-07 MED FILL — SPIRONOLACTONE 25 MG TABLET: 25 | 30 days supply | Qty: 60 | Fill #5

## 2020-06-10 ENCOUNTER — Encounter: Payer: Self-pay | Admitting: Cardiology

## 2020-06-10 NOTE — Assessment & Plan Note (Signed)
We reiterated the importance of staying with the medication, not missing doses.  Using the as needed hydralazine if indicated, and taking extra Lasix if her edema gets worse.  She gets in trouble but does not take medications correctly.

## 2020-06-10 NOTE — Assessment & Plan Note (Signed)
Last cath this April showed patent stents.  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  She is not actively having any anginal symptoms.  She does have microvascular ischemic symptoms when her pressures go higher or she becomes hypervolemic. ->  Gust the importance of aggressively following her blood pressures and taking the hydralazine as needed for high blood pressures.  She could also take extra dose of Lasix.  Plan:   Continue carvedilol along with other CHF medications including Entresto, and hydralazine  Continue atorvastatin, but she needs to take it once daily twice daily.  Continue Farxiga.  On maintenance dose Brilinta 60 mg twice daily -> if this becomes a financial difficulty, would switch to Plavix 75 mg daily, but continue lifelong.  Okay to hold 5 days preop for procedures or surgeries.

## 2020-06-10 NOTE — Assessment & Plan Note (Signed)
She has not had lipids checked in 2 years.  Was not at goal in 2019.  She has been taking atorvastatin 80 mg twice daily, I think this was not intentional.  Plan:   Continue atorvastatin 80 mg daily.  Check fasting lipid panel along with CMP.  (Also check A1c with her diabetes.)

## 2020-06-10 NOTE — Assessment & Plan Note (Signed)
More than just hypertension, she is significant) with episodes of hypertensive crisis leading to exacerbation of CHF/Takotsubo cardiomyopathy but flash pulmonary edema.  Her blood pressure has been very difficult control.  Today is a little high, but she tells me at home it is much better.   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  Seems like her blood pressures are finally stable (although high today since she has not yet taken her medications) -> I asked her to continue to follow her blood pressures at home.  I want her to take an additional dose of hydralazine for systolic pressure greater than 150 mmHg.  She will recheck her pressures when she gets home today and if still elevated will take the extra dose.

## 2020-06-10 NOTE — Assessment & Plan Note (Signed)
I spent about 4 minutes talking her about the need to quit smoking.  We talked about trying to cut down all the way.  She is not ready to make the final step.  Unfortunately she probably cannot afford options such as Wellbutrin or Chantix. Talk about maybe using Nicorette gum (it is probably still less expensive than cigarettes.)

## 2020-06-10 NOTE — Assessment & Plan Note (Signed)
Needs to follow-up with PCP.  Is now on Farxiga along with glipizide.

## 2020-06-10 NOTE — Assessment & Plan Note (Signed)
Her EF is definitely went down this April with hypertensive emergency.  Similar to what happened in May 2020.  Blood pressure go up really high, which he had the volume overload with pulmonary edema.  Plan:   Continue carvedilol 25 mg twice daily, Entresto 97-1 3 mg twice daily, along with spironolactone 20 mg twice daily..  She is  Continue [place] along with standing dose of Lasix.  Discussed with standing dose Lasix plus sliding scale for weight gain.  Continue recently added hydralazine 50 mg twice daily-I discussed PRN dosing for SBP> 150 mmHg  The more doses of hydralazine she has to take as needed, we may need to just titrate to 100 mg twice daily, and potentially add Isordil.

## 2020-07-05 MED FILL — PANTOPRAZOLE SOD DR 40 MG T: 40 | 30 days supply | Qty: 30 | Fill #4

## 2020-07-05 MED FILL — FARXIGA 10 MG TABLET: 10 | 30 days supply | Qty: 30 | Fill #4

## 2020-07-05 MED FILL — GABAPENTIN 300 MG CAPSULE: 300 | 30 days supply | Qty: 30 | Fill #1

## 2020-07-05 MED FILL — ATORVASTATIN CALCIUM 80 MG: 80 | 30 days supply | Qty: 30 | Fill #4

## 2020-07-05 MED FILL — BASAGLAR 100 UNIT/ML KWIKPE: 100 | 31 days supply | Qty: 12 | Fill #1

## 2020-07-19 ENCOUNTER — Telehealth: Payer: Self-pay | Admitting: Cardiology

## 2020-07-19 MED FILL — SPIRONOLACTONE 25 MG TABLET: 25 | 30 days supply | Qty: 60 | Fill #6

## 2020-07-19 NOTE — Telephone Encounter (Signed)
Follow Up:     Pt is returning Jenna's call from today 

## 2020-07-19 NOTE — Telephone Encounter (Signed)
Pt c/o BP issue: STAT if pt c/o blurred vision, one-sided weakness or slurred speech  1. What are your last 5 BP readings?  07/18/20 got up to 253 153 this morning   2. Are you having any other symptoms (ex. Dizziness, headache, blurred vision, passed out)? Vomiting, shaking, hot and cold flashes, fatigue, nausea, and dizziness when standing   3. What is your BP issue? Amy Chaney is calling stating yesterday her BP got up to 253. She states she was shaking, with hot and cold flashes, as well as vomiting. She states she took an extra Hydralazine when this occurred and also drank vinegar and pickle juice. She states this occurred around 5 pm and an hour after taking the medication and vinegar/pickle juice it came down to 203. By the time she laid down at 11 pm it was at 102. She states this morning it is 153 and she had symptoms of fatigue, nausea, and dizziness when standing too long, but none of these symptoms were present at the time of the call. Please advise.

## 2020-07-19 NOTE — Telephone Encounter (Signed)
Returned call to patient who states that her blood pressure was 998 and 338 systolic yesterday with heart rate of 118. Patient unable to provide diastolic blood pressure readings. Patient states that during this time with hypertension she was nauseated, fatigued and dizziness upon standing. Patient states that today her most recent blood pressure was 153/68. Patient states that she did not take her carvedilol yesterday due to feeling bloated and nauseous. Patient does state that she took all prescribed medication today except her spironolactone in which she states she will call the pharmacy to refill today.  Patient denies any chest pain, dizziness, nausea, or shortness of breath at this time. Patient also denies any swelling at this time. Patient also states that she drank a cup of vinegar and a cup of pickle juice. Advised patient of importance of watching sodium content in foods/drinks. Advised patient of importance of taking all medication as prescribed. Advised patient that if blood pressure was to increase to 250'N systolic again then she would need to seek Emergent evaluation. Advised patient to continue monitoring blood pressure. Will forward to MD to make aware.

## 2020-07-19 NOTE — Telephone Encounter (Signed)
Attempted to call patient, left message for patient to call back to office.   

## 2020-07-20 ENCOUNTER — Emergency Department (HOSPITAL_COMMUNITY): Payer: Self-pay

## 2020-07-20 ENCOUNTER — Other Ambulatory Visit: Payer: Self-pay

## 2020-07-20 ENCOUNTER — Encounter (HOSPITAL_COMMUNITY): Payer: Self-pay

## 2020-07-20 ENCOUNTER — Inpatient Hospital Stay (HOSPITAL_COMMUNITY)
Admission: EM | Admit: 2020-07-20 | Discharge: 2020-07-22 | DRG: 690 | Disposition: A | Discharge status: Home / Self Care | Payer: Self-pay | Attending: Internal Medicine | Admitting: Internal Medicine

## 2020-07-20 DIAGNOSIS — I252 Old myocardial infarction: Secondary | ICD-10-CM

## 2020-07-20 DIAGNOSIS — T82867A Thrombosis of cardiac prosthetic devices, implants and grafts, initial encounter: Secondary | ICD-10-CM | POA: Diagnosis present

## 2020-07-20 DIAGNOSIS — E119 Type 2 diabetes mellitus without complications: Secondary | ICD-10-CM | POA: Diagnosis present

## 2020-07-20 DIAGNOSIS — K219 Gastro-esophageal reflux disease without esophagitis: Secondary | ICD-10-CM | POA: Diagnosis present

## 2020-07-20 DIAGNOSIS — I110 Hypertensive heart disease with heart failure: Secondary | ICD-10-CM | POA: Diagnosis present

## 2020-07-20 DIAGNOSIS — I25119 Atherosclerotic heart disease of native coronary artery with unspecified angina pectoris: Secondary | ICD-10-CM | POA: Diagnosis present

## 2020-07-20 DIAGNOSIS — N10 Acute tubulo-interstitial nephritis: Principal | ICD-10-CM | POA: Diagnosis present

## 2020-07-20 DIAGNOSIS — R509 Fever, unspecified: Secondary | ICD-10-CM

## 2020-07-20 DIAGNOSIS — Z87410 Personal history of cervical dysplasia: Secondary | ICD-10-CM

## 2020-07-20 DIAGNOSIS — N898 Other specified noninflammatory disorders of vagina: Secondary | ICD-10-CM | POA: Diagnosis present

## 2020-07-20 DIAGNOSIS — F32A Depression, unspecified: Secondary | ICD-10-CM | POA: Diagnosis present

## 2020-07-20 DIAGNOSIS — I5181 Takotsubo syndrome: Secondary | ICD-10-CM | POA: Diagnosis present

## 2020-07-20 DIAGNOSIS — R7881 Bacteremia: Secondary | ICD-10-CM | POA: Diagnosis present

## 2020-07-20 DIAGNOSIS — E785 Hyperlipidemia, unspecified: Secondary | ICD-10-CM | POA: Diagnosis present

## 2020-07-20 DIAGNOSIS — K5900 Constipation, unspecified: Secondary | ICD-10-CM | POA: Diagnosis present

## 2020-07-20 DIAGNOSIS — Z833 Family history of diabetes mellitus: Secondary | ICD-10-CM

## 2020-07-20 DIAGNOSIS — I2510 Atherosclerotic heart disease of native coronary artery without angina pectoris: Secondary | ICD-10-CM | POA: Diagnosis present

## 2020-07-20 DIAGNOSIS — Z794 Long term (current) use of insulin: Secondary | ICD-10-CM

## 2020-07-20 DIAGNOSIS — I255 Ischemic cardiomyopathy: Secondary | ICD-10-CM | POA: Diagnosis present

## 2020-07-20 DIAGNOSIS — I5042 Chronic combined systolic (congestive) and diastolic (congestive) heart failure: Secondary | ICD-10-CM | POA: Diagnosis present

## 2020-07-20 DIAGNOSIS — R079 Chest pain, unspecified: Secondary | ICD-10-CM

## 2020-07-20 DIAGNOSIS — Z9861 Coronary angioplasty status: Secondary | ICD-10-CM

## 2020-07-20 DIAGNOSIS — Z7984 Long term (current) use of oral hypoglycemic drugs: Secondary | ICD-10-CM

## 2020-07-20 DIAGNOSIS — Y831 Surgical operation with implant of artificial internal device as the cause of abnormal reaction of the patient, or of later complication, without mention of misadventure at the time of the procedure: Secondary | ICD-10-CM | POA: Diagnosis present

## 2020-07-20 DIAGNOSIS — Z79899 Other long term (current) drug therapy: Secondary | ICD-10-CM

## 2020-07-20 DIAGNOSIS — E876 Hypokalemia: Secondary | ICD-10-CM | POA: Diagnosis present

## 2020-07-20 DIAGNOSIS — I161 Hypertensive emergency: Secondary | ICD-10-CM | POA: Diagnosis present

## 2020-07-20 DIAGNOSIS — B9620 Unspecified Escherichia coli [E. coli] as the cause of diseases classified elsewhere: Secondary | ICD-10-CM | POA: Diagnosis present

## 2020-07-20 DIAGNOSIS — R0989 Other specified symptoms and signs involving the circulatory and respiratory systems: Secondary | ICD-10-CM | POA: Diagnosis present

## 2020-07-20 DIAGNOSIS — N12 Tubulo-interstitial nephritis, not specified as acute or chronic: Secondary | ICD-10-CM

## 2020-07-20 DIAGNOSIS — Z20822 Contact with and (suspected) exposure to covid-19: Secondary | ICD-10-CM | POA: Diagnosis present

## 2020-07-20 DIAGNOSIS — F17210 Nicotine dependence, cigarettes, uncomplicated: Secondary | ICD-10-CM | POA: Diagnosis present

## 2020-07-20 DIAGNOSIS — G629 Polyneuropathy, unspecified: Secondary | ICD-10-CM | POA: Diagnosis present

## 2020-07-20 DIAGNOSIS — Z7901 Long term (current) use of anticoagulants: Secondary | ICD-10-CM

## 2020-07-20 DIAGNOSIS — Z9049 Acquired absence of other specified parts of digestive tract: Secondary | ICD-10-CM

## 2020-07-20 LAB — BASIC METABOLIC PANEL
Anion gap: 9 (ref 5–15)
BUN: 11 mg/dL (ref 6–20)
CO2: 23 mmol/L (ref 22–32)
Calcium: 8.1 mg/dL — ABNORMAL LOW (ref 8.9–10.3)
Chloride: 106 mmol/L (ref 98–111)
Creatinine, Ser: 1 mg/dL (ref 0.44–1.00)
GFR, Estimated: >60 mL/min (ref >60)
Glucose, Bld: 191 mg/dL — ABNORMAL HIGH (ref 70–99)
Potassium: 3 mmol/L — ABNORMAL LOW (ref 3.5–5.1)
Sodium: 138 mmol/L (ref 135–145)

## 2020-07-20 LAB — CBC WITH DIFFERENTIAL/PLATELET
Abs Immature Granulocytes: 0.06 10*3/uL (ref 0.00–0.07)
Abs Immature Granulocytes: 0.06 10*3/uL (ref 0.00–0.07)
Basophils Absolute: 0.1 10*3/uL (ref 0.0–0.1)
Basophils Absolute: 0.1 10*3/uL (ref 0.0–0.1)
Basophils Relative: 1 %
Basophils Relative: 1 %
Eosinophils Absolute: 0.2 10*3/uL (ref 0.0–0.5)
Eosinophils Absolute: 0.2 10*3/uL (ref 0.0–0.5)
Eosinophils Relative: 3 %
Eosinophils Relative: 2 %
HCT: 31.5 % — ABNORMAL LOW (ref 36.0–46.0)
HCT: 32.2 % — ABNORMAL LOW (ref 36.0–46.0)
Hemoglobin: 10.2 g/dL — ABNORMAL LOW (ref 12.0–15.0)
Hemoglobin: 10 g/dL — ABNORMAL LOW (ref 12.0–15.0)
Immature Granulocytes: 1 %
Immature Granulocytes: 1 %
Lymphocytes Relative: 7 %
Lymphocytes Relative: 17 %
Lymphs Abs: 0.7 10*3/uL (ref 0.7–4.0)
Lymphs Abs: 1.8 10*3/uL (ref 0.7–4.0)
MCH: 26.6 pg (ref 26.0–34.0)
MCH: 25.7 pg — ABNORMAL LOW (ref 26.0–34.0)
MCHC: 32.4 g/dL (ref 30.0–36.0)
MCHC: 31.1 g/dL (ref 30.0–36.0)
MCV: 82.2 fL (ref 80.0–100.0)
MCV: 82.8 fL (ref 80.0–100.0)
Monocytes Absolute: 0.3 10*3/uL (ref 0.1–1.0)
Monocytes Absolute: 1 10*3/uL (ref 0.1–1.0)
Monocytes Relative: 4 %
Monocytes Relative: 10 %
Neutro Abs: 7.7 10*3/uL (ref 1.7–7.7)
Neutro Abs: 7.7 10*3/uL (ref 1.7–7.7)
Neutrophils Relative %: 84 %
Neutrophils Relative %: 69 %
Platelets: 323 10*3/uL (ref 150–400)
Platelets: 333 10*3/uL (ref 150–400)
RBC: 3.83 MIL/uL — ABNORMAL LOW (ref 3.87–5.11)
RBC: 3.89 MIL/uL (ref 3.87–5.11)
RDW: 15.9 % — ABNORMAL HIGH (ref 11.5–15.5)
RDW: 15.9 % — ABNORMAL HIGH (ref 11.5–15.5)
WBC: 9 10*3/uL (ref 4.0–10.5)
WBC: 10.9 10*3/uL — ABNORMAL HIGH (ref 4.0–10.5)
nRBC: 0 % (ref 0.0–0.2)
nRBC: 0 % (ref 0.0–0.2)

## 2020-07-20 LAB — RESP PANEL BY RT-PCR (FLU A&B, COVID) ARPGX2
Influenza A by PCR: NEGATIVE
Influenza B by PCR: NEGATIVE
SARS Coronavirus 2 by RT PCR: NEGATIVE

## 2020-07-20 LAB — MAGNESIUM: Magnesium: 1.6 mg/dL — ABNORMAL LOW (ref 1.7–2.4)

## 2020-07-20 LAB — URINALYSIS, ROUTINE W REFLEX MICROSCOPIC
Bilirubin Urine: NEGATIVE
Glucose, UA: >=500 mg/dL — AB
Ketones, ur: NEGATIVE mg/dL
Nitrite: POSITIVE — AB
Protein, ur: NEGATIVE mg/dL
Specific Gravity, Urine: 1.04 — ABNORMAL HIGH (ref 1.005–1.030)
WBC, UA: >50 WBC/hpf — ABNORMAL HIGH (ref 0–5)
pH: 5 (ref 5.0–8.0)

## 2020-07-20 LAB — COMPREHENSIVE METABOLIC PANEL
ALT: 15 U/L (ref 0–44)
AST: 17 U/L (ref 15–41)
Albumin: 2.9 g/dL — ABNORMAL LOW (ref 3.5–5.0)
Alkaline Phosphatase: 49 U/L (ref 38–126)
Anion gap: 14 (ref 5–15)
BUN: 13 mg/dL (ref 6–20)
CO2: 21 mmol/L — ABNORMAL LOW (ref 22–32)
Calcium: 8.6 mg/dL — ABNORMAL LOW (ref 8.9–10.3)
Chloride: 105 mmol/L (ref 98–111)
Creatinine, Ser: 1.11 mg/dL — ABNORMAL HIGH (ref 0.44–1.00)
GFR, Estimated: >60 mL/min (ref >60)
Glucose, Bld: 270 mg/dL — ABNORMAL HIGH (ref 70–99)
Potassium: 2.8 mmol/L — ABNORMAL LOW (ref 3.5–5.1)
Sodium: 140 mmol/L (ref 135–145)
Total Bilirubin: 1.5 mg/dL — ABNORMAL HIGH (ref 0.3–1.2)
Total Protein: 7 g/dL (ref 6.5–8.1)

## 2020-07-20 LAB — WET PREP, GENITAL
Clue Cells Wet Prep HPF POC: NONE SEEN
Sperm: NONE SEEN
Trich, Wet Prep: NONE SEEN
Yeast Wet Prep HPF POC: NONE SEEN

## 2020-07-20 LAB — LACTIC ACID, PLASMA: Lactic Acid, Venous: 1.2 mmol/L (ref 0.5–1.9)

## 2020-07-20 LAB — GLUCOSE, CAPILLARY: Glucose-Capillary: 167 mg/dL — ABNORMAL HIGH (ref 70–99)

## 2020-07-20 LAB — RPR: RPR Ser Ql: NONREACTIVE

## 2020-07-20 LAB — CBG MONITORING, ED: Glucose-Capillary: 231 mg/dL — ABNORMAL HIGH (ref 70–99)

## 2020-07-20 LAB — I-STAT BETA HCG BLOOD, ED (MC, WL, AP ONLY): I-stat hCG, quantitative: <5 m[IU]/mL (ref <5)

## 2020-07-20 LAB — HIV ANTIBODY (ROUTINE TESTING W REFLEX): HIV Screen 4th Generation wRfx: NONREACTIVE

## 2020-07-20 MED ORDER — INSULIN ASPART 100 UNIT/ML ~~LOC~~ SOLN
0.0000 [IU] | Freq: Every day | SUBCUTANEOUS | Status: DC
  Administered 2020-07-21: 2 [IU] via SUBCUTANEOUS

## 2020-07-20 MED ORDER — PANTOPRAZOLE SODIUM 40 MG PO TBEC
40.0000 mg | DELAYED_RELEASE_TABLET | Freq: Every day | ORAL | Status: DC
  Administered 2020-07-20 – 2020-07-22 (×3): 40 mg via ORAL
  Filled 2020-07-20 (×3): qty 1

## 2020-07-20 MED ORDER — ACETAMINOPHEN 325 MG PO TABS
650.0000 mg | ORAL_TABLET | Freq: Four times a day (QID) | ORAL | Status: DC | PRN
  Administered 2020-07-20 – 2020-07-21 (×2): 650 mg via ORAL
  Filled 2020-07-20 (×2): qty 2

## 2020-07-20 MED ORDER — SODIUM CHLORIDE 0.9 % IV SOLN
1.0000 g | Freq: Once | INTRAVENOUS | Status: AC
  Administered 2020-07-20: 1 g via INTRAVENOUS
  Filled 2020-07-20: qty 10

## 2020-07-20 MED ORDER — IOHEXOL 300 MG/ML  SOLN
100.0000 mL | Freq: Once | INTRAMUSCULAR | Status: AC | PRN
  Administered 2020-07-20: 80 mL via INTRAVENOUS

## 2020-07-20 MED ORDER — GABAPENTIN 300 MG PO CAPS
300.0000 mg | ORAL_CAPSULE | Freq: Every day | ORAL | Status: DC
  Administered 2020-07-20 – 2020-07-21 (×2): 300 mg via ORAL
  Filled 2020-07-20 (×2): qty 1

## 2020-07-20 MED ORDER — NICOTINE 14 MG/24HR TD PT24
14.0000 mg | MEDICATED_PATCH | Freq: Every day | TRANSDERMAL | Status: DC
  Administered 2020-07-20 – 2020-07-22 (×3): 14 mg via TRANSDERMAL
  Filled 2020-07-20 (×3): qty 1

## 2020-07-20 MED ORDER — SODIUM CHLORIDE 0.9 % IV SOLN
1.0000 g | INTRAVENOUS | Status: DC
  Filled 2020-07-20: qty 10

## 2020-07-20 MED ORDER — POTASSIUM CHLORIDE 10 MEQ/100ML IV SOLN
10.0000 meq | INTRAVENOUS | Status: AC
  Administered 2020-07-20 (×4): 10 meq via INTRAVENOUS
  Filled 2020-07-20 (×4): qty 100

## 2020-07-20 MED ORDER — SODIUM CHLORIDE 0.9 % IV SOLN: 1.0000 g | Freq: Once | INTRAVENOUS | Status: DC

## 2020-07-20 MED ORDER — DOXYCYCLINE HYCLATE 100 MG PO TABS
100.0000 mg | ORAL_TABLET | Freq: Once | ORAL | Status: AC
  Administered 2020-07-20: 100 mg via ORAL
  Filled 2020-07-20: qty 1

## 2020-07-20 MED ORDER — SENNOSIDES-DOCUSATE SODIUM 8.6-50 MG PO TABS
1.0000 | ORAL_TABLET | Freq: Every evening | ORAL | Status: DC | PRN
Start: 2020-07-20 — End: 2020-07-22

## 2020-07-20 MED ORDER — ATORVASTATIN CALCIUM 80 MG PO TABS
80.0000 mg | ORAL_TABLET | Freq: Every day | ORAL | Status: DC
  Administered 2020-07-20 – 2020-07-22 (×3): 80 mg via ORAL
  Filled 2020-07-20 (×3): qty 1

## 2020-07-20 MED ORDER — SODIUM CHLORIDE 0.9 % IV BOLUS
1000.0000 mL | Freq: Once | INTRAVENOUS | Status: AC
  Administered 2020-07-20: 1000 mL via INTRAVENOUS

## 2020-07-20 MED ORDER — INSULIN GLARGINE 100 UNIT/ML ~~LOC~~ SOLN
30.0000 [IU] | Freq: Every day | SUBCUTANEOUS | Status: DC
  Administered 2020-07-20 – 2020-07-21 (×2): 30 [IU] via SUBCUTANEOUS
  Filled 2020-07-20 (×3): qty 0.3

## 2020-07-20 MED ORDER — HYDRALAZINE HCL 50 MG PO TABS
50.0000 mg | ORAL_TABLET | Freq: Three times a day (TID) | ORAL | Status: DC
  Administered 2020-07-20 – 2020-07-22 (×6): 50 mg via ORAL
  Filled 2020-07-20 (×3): qty 1
  Filled 2020-07-20: qty 2
  Filled 2020-07-20 (×2): qty 1

## 2020-07-20 MED ORDER — INSULIN ASPART 100 UNIT/ML ~~LOC~~ SOLN
0.0000 [IU] | Freq: Three times a day (TID) | SUBCUTANEOUS | Status: DC
  Administered 2020-07-20 – 2020-07-21 (×2): 5 [IU] via SUBCUTANEOUS
  Administered 2020-07-21 (×2): 3 [IU] via SUBCUTANEOUS
  Administered 2020-07-22: 8 [IU] via SUBCUTANEOUS
  Administered 2020-07-22: 3 [IU] via SUBCUTANEOUS

## 2020-07-20 MED ORDER — ACETAMINOPHEN 650 MG RE SUPP
650.0000 mg | Freq: Once | RECTAL | Status: AC
  Administered 2020-07-20: 650 mg via RECTAL
  Filled 2020-07-20: qty 1

## 2020-07-20 MED ORDER — POTASSIUM CHLORIDE 10 MEQ/100ML IV SOLN
10.0000 meq | INTRAVENOUS | Status: DC
  Administered 2020-07-20 – 2020-07-21 (×3): 10 meq via INTRAVENOUS
  Filled 2020-07-20 (×4): qty 100

## 2020-07-20 MED ORDER — ACETAMINOPHEN 650 MG RE SUPP: 650.0000 mg | Freq: Four times a day (QID) | RECTAL | Status: DC | PRN

## 2020-07-20 MED ORDER — ENOXAPARIN SODIUM 40 MG/0.4ML ~~LOC~~ SOLN
40.0000 mg | Freq: Every day | SUBCUTANEOUS | Status: DC
  Administered 2020-07-20 – 2020-07-22 (×3): 40 mg via SUBCUTANEOUS
  Filled 2020-07-20 (×3): qty 0.4

## 2020-07-20 MED ORDER — BISACODYL 5 MG PO TBEC
5.0000 mg | DELAYED_RELEASE_TABLET | Freq: Every day | ORAL | Status: DC | PRN
Start: 2020-07-20 — End: 2020-07-22

## 2020-07-20 MED ORDER — TICAGRELOR 60 MG PO TABS
60.0000 mg | ORAL_TABLET | Freq: Two times a day (BID) | ORAL | Status: DC
  Administered 2020-07-20 – 2020-07-22 (×5): 60 mg via ORAL
  Filled 2020-07-20 (×6): qty 1

## 2020-07-20 MED FILL — TRUE METRIX GLUCOSE TEST ST: 25 days supply | Qty: 100 | Fill #2

## 2020-07-20 NOTE — Telephone Encounter (Signed)
Spoke with patient, patient states that she is currently being admitted for Pyelonephritis. Advised patient of Dr. Allison Quarry recommendations, advised patient to follow up after hospitalization.

## 2020-07-20 NOTE — ED Triage Notes (Addendum)
Pt brought in by [facility] d/t c/o SOB since last night as well as vomiting & diarrhea. EMS reports temp 103.3 & she vomited up the Tylenol they gave her. 319 CBG, 98% RA, 90 bpm, 160/90. 102.8 oral temp upon arrival.

## 2020-07-20 NOTE — Telephone Encounter (Signed)
First question is that she take the as needed hydralazine likely indicated for as needed high blood pressure.  She needs to take all of her blood pressure medications and then reassess her pressures.  If they are still elevated to 150s or greater, she needs to increase hydralazine to 100 mg twice daily and then use 100 mg hydralazine for as needed pressures greater than 160.  Glenetta Hew, MD

## 2020-07-20 NOTE — H&P (Signed)
Date: 07/20/2020               Patient Name:  Amy Chaney MRN: [id number]  DOB: 1971/03/14 Age / Sex: 49 y.o., female   PCP: Amy Rakes, MD         Medical Service: Internal Medicine Teaching Service         Attending Physician: Dr. Velna Ochs, MD    First Contact: Dr. Demaris Callander Pager: [phone]   Second Contact: Dr.Basaraba Pager: [phone]       After Hours (After 5p/  First Contact Pager: [phone]  weekends / holidays): Second Contact Pager: [phone]   Chief Complaint: shortness of breath, fever  History of Present Illness: Amy Chaney is a 49 year old female with past medical history of combined systolic and diastolic CHF, HLD, HTN, tobacco use disorder, and type 2 diabetes who present with dysuria, fever, and shortness of breath. Patient said her dysuria started 12/13 and progressed to having fevers and chills on Sunday. She went to work on Monday and she was weak. She came home from work and starting throwin up around 11 pm that afternoon . She was shaking this morning and having chills, vomited again , and started to have some  shortness of breath. She denies diarrhea, chest pain, productive cough, vision changes, hematuria, and hematochezia. Endorses neuropathy and constipation.    Meds:  Current Meds  Medication Sig  . atorvastatin (LIPITOR) 80 MG tablet Take 1 tablet (80 mg total) by mouth daily.  . carvedilol (COREG) 25 MG tablet Take 1 tablet (25 mg total) by mouth 2 (two) times daily.  . dapagliflozin propanediol (FARXIGA) 10 MG TABS tablet Take 1 tablet (10 mg total) by mouth daily before breakfast.  . furosemide (LASIX) 20 MG tablet Take 20mg  tablet by mouth daily and may take an additional 20mg - 40mg  if weight is more than 3 to 5lbs. (Patient taking differently: Take 20 mg by mouth 2 (two) times daily.)  . gabapentin (NEURONTIN) 300 MG capsule Take 1 capsule (300 mg total) by mouth at bedtime.  Marland Kitchen glipiZIDE (GLUCOTROL) 10 MG tablet Take 1 tablet (10  mg total) by mouth 2 (two) times daily before a meal.  . hydrALAZINE (APRESOLINE) 50 MG tablet Take 50mg  twice a day.  Pt may take an additional tablet (50mg ) if pt's blood pressure is greater than 123XX123 systolic (top number). (Patient taking differently: Take 50 mg by mouth 3 (three) times daily.)  . insulin glargine (LANTUS SOLOSTAR) 100 UNIT/ML Solostar Pen Inject 38 Units into the skin daily.  . naproxen sodium (ALEVE) 220 MG tablet Take 220 mg by mouth daily as needed (pain).  . nitroGLYCERIN (NITROSTAT) 0.4 MG SL tablet Place 1 tablet (0.4 mg total) under the tongue every 5 (five) minutes as needed for chest pain. Reported on 11/25/2015 (Patient taking differently: Place 0.4 mg under the tongue every 5 (five) minutes as needed for chest pain (max 3 doses).)  . pantoprazole (PROTONIX) 40 MG tablet Take 1 tablet (40 mg total) by mouth daily.  . sacubitril-valsartan (ENTRESTO) 97-103 MG Take 1 tablet by mouth 2 (two) times daily.  Marland Kitchen spironolactone (ALDACTONE) 25 MG tablet Take 1 tablet (25 mg total) by mouth 2 (two) times daily.  . ticagrelor (BRILINTA) 60 MG TABS tablet Take 1 tablet (60 mg total) by mouth 2 (two) times daily.     Allergies: Allergies as of 07/20/2020  . (No Known Allergies)   Past Medical History:  Diagnosis Date  . Coronary  artery disease involving native coronary artery of native heart with angina pectoris ([place]) 01/2013   a. s/p PCI of the mid RCA 01/2013 //  b. LHC 9/14: EF 55%, RCA stent 100% occluded, LAD irregularities >>  subacute stent thrombosis, Promus DES PCI distal overlap // c) 09/2017 - Ant STEMI - LAD-D2 PCI - Successful PCI of LAD (3 overlapping DES), unable to restore flow down D2l that was stented as well.  Likely related to downstream dissection, but unable to rewire.  . Daily headache   . Depression   . Dyslipidemia, goal LDL below 70   . History of Doppler ultrasound    carotid bruit >> a. Carotid US 6/17: bilat ICA 1-39%  . History of Takotsubo  cardiomyopathy 11/2018   Admitted for acute on chronic combined systolic and diastolic heart failure.  EF down to 20-25% global hypokinesis.  Was in the setting of grieving over the loss of her son from MI.  ->  EF improved to 45-50% on follow-up echocardiogram.  . Hypertension   . Ischemic cardiomyopathy 06/2018   s/p inferior STEMI followed by 2 anterior STEMIs: Following recovery from Takotsubo Cardiomyopathy- Echo 02/14/19: Moderate LVH.  EF improved up to 45-50%.  GR 1 DD.  Severe apical akinesis.  Normal valves.  . Mild dysplasia of cervix ([place]) 02/04/2019   Seen after colposcopy on 01/23/19 done for ASCU +HPV pap  > repeat pap and HPV test in 12 and 24 months  . STEMI involving left anterior descending coronary artery ([place]) 09/2017; 06/2018   a) Severe [place] 1, 1, 1 LAD-D2 lesion (complicated by post PTCA dissection/intramural hematoma) -successful extensive PCI of the LAD but unable to maintain patency of the stented D2. b) distal mLAD stent Edge 100% thrombosis --> PTCA & Overlapping DES PCI (Synergy 3 x 12 --> 3.6-3.3 mm). D2 occluded. RCA stents patent, 65 % OM2. EF 30-35%.  Marland Kitchen STEMI involving oth coronary artery of inferior wall ([place]) 03/2013   Secondary to subacute stent thrombosis of RCA stent segment having missed 4 doses of Effient.  . Tobacco abuse 01/11/2016  . Type II diabetes mellitus ([place]) 12/2010   sister and son also diabetic     Family History:  Family History  Problem Relation Age of Onset  . Diabetes Mother   . Diabetes Sister   . Diabetes Sister   . Diabetes Son        type 1     Social History:  Lives with her son and sister.  Tabacco use , cutting back and smokes 1 pack every 3 days. Prior 1ppd x 25 years. No alcohol or illicit drug use.   Review of Systems: A complete ROS was negative except as per HPI.   Physical Exam: Blood pressure (!) 164/65, pulse 77, temperature 98.7 F (37.1 C), temperature source Oral, resp. rate (!) 31, height 5\' 9"  (1.753 m),  weight 104.3 kg, last menstrual period 10/20/2015, SpO2 100 %.  General: middle age female, NAD HE: Normocephalic, atraumatic , EOMI, Conjunctivae normal ENT: No congestion, no rhinorrhea, no exudate or erythema , mmm Cardiovascular: Normal rate, regular rhythm.  No murmurs, rubs, or gallops Pulmonary : Effort normal, breath sounds normal. No wheezes, rales, or rhonchi Abdominal: soft, nontender,  bowel sounds present, Bilateral CVA tenderness R>L Ext: No LE edema, no deformity Skin: Warm, dry , no rash Psychiatric/Behavioral:  normal mood, normal behavior  Neuro: AO x 4   EKG: personally reviewed my interpretation is Sinus rhythm , Nl axis,  normal PR and qrs intervals, borderline prolonged non specific T wave abnormalities ( in setting of hypokalemia)   CXR: personally reviewed my interpretation is no pleural effusions, focal opacities, or acute osseous abnormalities   Assessment & Plan by Problem: Active Problems:   Pyelonephritis  Amy Chaney is a 49 year old female with past medical history of combined systolic and diastolic CHF, HLD, HTN, tobacco use disorder, and type 2 diabetes who presents with a week of dysuria , recent fever/chills, and one episode of nausea /vomitting.   Pyelonephritis  - Patient febrile 102.8, experiencing fever and chills. BP stable.  -UA positive for nitrites and leukocytes.   - CT abdomen and pelvis read a concern for a degree of bilateral bacterial nephritis/pyelonephritis. - Given one dose of Ceftriaxone in ED. Will continue Ceftriaxone 1 g daily - Follow Urine Cultures.  - CBC  Hypokalemia Elevated Cr - K 2.8, given 4 runs of KCl in ED  - BMP now to check K and see Cr trend, will give fluids if patient not keeping up PO intake.   STD? Patient has not been sexually active in 6 months until having intercourse 2 weeks ago. Her symptoms started shortly after on 12/13. Pelvic exam completed in the ED , noted thin white discharge. Given one dose  of doxycycline in ED.  - F/u GC/Chlamydia, Wet prep, RPR, and HIV   Type 2 DM On Farxiga, Glipizide, and Lantus 38 units qhs. Hgb A1c 10.2 11/17/2019 - Lantus 30 units - SSI-M - QHS correction scale - Hemoglobin A1c   CAD HFpEF HTN HLD  - Holding Coreg, Entresto, Lasix, Spirnolactone  . Patient's is hypertensive, but given infection will hold some medication overnight . Can restart if patient SBP stays above 160 - Continue Hyralazine 50 mg TID, Brilinta, Lipitor   GERD - continue Protonix   AOZ:HYQMVHQ Diet: CMHH Code: Full  Dispo: Admit patient to Observation with expected length of stay less than 2 midnights.  Signed: Madalyn Rob, MD 07/20/2020, 2:51 PM  Pager: @MYPAGER @ After 5pm on weekdays and 1pm on weekends: On Call pager: [phone]

## 2020-07-20 NOTE — ED Provider Notes (Signed)
[facility] EMERGENCY DEPARTMENT Provider Note   CSN: [id number] Arrival date & time: 07/20/20  3875     History Chief Complaint  Patient presents with  . Shortness of Breath  . Fever    Amy Chaney is a 49 y.o. female with past medical history of CAD, dyslipidemia, Takotsubo cardiomyopathy, hypertension, tobacco abuse, type 2 diabetes.  Had COVID vaccinations.  Abdominal surgical history includes cholecystectomy.  HPI Patient presents to emergency room today via EMS with chief complaint of shortness of breath, vomiting and diarrhea.  She states the shortness of breath is going on x1 day.  She said she has had vomiting and diarrhea x2 days.  She states she overall feels very poorly and has been subjective fever and chills. She has been unable to tolerate any p.o. intake.  She has had generalized abdominal tenderness and dysuria.  She also reports vaginal discharge.  She states she is concerned about having STD, possibly trichomonas.  She is sexually active with 1 female partner without protection.  She states she does not think her partner has been tested.  She is endorsing thin white vaginal discharge.  On EMS arrival she was found to have a temp of 103.3 orally.  She was given PO tylenol however immediately vomited.  She states her sister is home with similar GI symptoms.  She denies any suspicious food intake or recent antibiotic use.  Has been taking Aleve at home minimal symptom improvement.  Last dose was yesterday. She denies congestion, cough, chest pain, pelvic pain, abnormal vaginal bleeding, gross hematuria, diarrhea.     Past Medical History:  Diagnosis Date  . Coronary artery disease involving native coronary artery of native heart with angina pectoris ([place]) 01/2013   a. s/p PCI of the mid RCA 01/2013 //  b. LHC 9/14: EF 55%, RCA stent 100% occluded, LAD irregularities >>  subacute stent thrombosis, Promus DES PCI distal overlap // c) 09/2017 - Ant STEMI -  LAD-D2 PCI - Successful PCI of LAD (3 overlapping DES), unable to restore flow down D2l that was stented as well.  Likely related to downstream dissection, but unable to rewire.  . Daily headache   . Depression   . Dyslipidemia, goal LDL below 70   . History of Doppler ultrasound    carotid bruit >> a. Carotid US 6/17: bilat ICA 1-39%  . History of Takotsubo cardiomyopathy 11/2018   Admitted for acute on chronic combined systolic and diastolic heart failure.  EF down to 20-25% global hypokinesis.  Was in the setting of grieving over the loss of her son from MI.  ->  EF improved to 45-50% on follow-up echocardiogram.  . Hypertension   . Ischemic cardiomyopathy 06/2018   s/p inferior STEMI followed by 2 anterior STEMIs: Following recovery from Takotsubo Cardiomyopathy- Echo 02/14/19: Moderate LVH.  EF improved up to 45-50%.  GR 1 DD.  Severe apical akinesis.  Normal valves.  . Mild dysplasia of cervix (CIN I) 02/04/2019   Seen after colposcopy on 01/23/19 done for ASCU +HPV pap  > repeat pap and HPV test in 12 and 24 months  . STEMI involving left anterior descending coronary artery ([place]) 09/2017; 06/2018   a) Severe [place] 1, 1, 1 LAD-D2 lesion (complicated by post PTCA dissection/intramural hematoma) -successful extensive PCI of the LAD but unable to maintain patency of the stented D2. b) distal mLAD stent Edge 100% thrombosis --> PTCA & Overlapping DES PCI (Synergy 3 x 12 --> 3.6-3.3 mm).  D2 occluded. RCA stents patent, 65 % OM2. EF 30-35%.  Marland Kitchen STEMI involving oth coronary artery of inferior wall ([place]) 03/2013   Secondary to subacute stent thrombosis of RCA stent segment having missed 4 doses of Effient.  . Tobacco abuse 01/11/2016  . Type II diabetes mellitus ([place]) 12/2010   sister and son also diabetic     Patient Active Problem List   Diagnosis Date Noted  . Trichomoniasis 03/30/2019  . Mild dysplasia of cervix (CIN I) 02/04/2019  . Screening breast examination 01/16/2019  . Acute respiratory  failure with hypoxia ([place])   . Flash pulmonary edema ([place])   . Pulmonary edema 12/18/2018  . Atypical squamous cell changes of undetermined significance (ASCUS) on cervical cytology with positive high risk human papilloma virus (HPV) 11/21/2018  . Acute on chronic combined systolic and diastolic CHF (congestive heart failure) ([place]) 07/05/2018  . Dyslipidemia, goal LDL below 70   . Acute ST elevation myocardial infarction (STEMI) involving left anterior descending (LAD) coronary artery ([place]) 07/03/2018  . Dark stools 04/03/2018  . Hypertensive emergency 12/30/2017  . STEMI involving left anterior descending coronary artery ([place]) 10/10/2017  . MI, acute, non ST segment elevation ([place])   . Chest pain 01/11/2016  . Carotid artery disease ([place]) 01/11/2016  . Prolonged Q-T interval on ECG 01/11/2016  . Tobacco abuse counseling 01/11/2016  . Heart palpitations 01/11/2016  . Tobacco abuse 01/11/2016  . Morbid obesity ([place]): BMI ~36 with HTN, HLD, CAD 11/25/2015  . Smoker 03/03/2015  . Coronary artery disease involving native coronary artery of native heart without angina pectoris 12/09/2014  . Chronic combined systolic (congestive) and diastolic (congestive) heart failure ([place]) 12/09/2014  . H/O noncompliance with medical treatment, presenting hazards to health 12/09/2014  . S/P primary angioplasty with coronary stent 11/18/2014  . Diabetes type 2, uncontrolled ([place]) 04/01/2013  . Hyperlipidemia associated with type 2 diabetes mellitus ([place]) 04/01/2013  . Depression 04/01/2013  . Stress at home 04/01/2013  . Hypertensive heart disease with chronic combined systolic and diastolic congestive heart failure ([place]) 09/05/2011  . Type II diabetes mellitus ([place]) 12/30/2010    Past Surgical History:  Procedure Laterality Date  . CHOLECYSTECTOMY  ~ 2000  . CORONARY STENT INTERVENTION N/A 10/10/2017   Procedure: CORONARY STENT INTERVENTION;  Surgeon: Leonie Man, MD;  Location: [place] [facility]  [facility];  Service: Cardiovascular; LAD PCI: STENT SYNERGY DES 3.5X32 (p),STENT SYNERGY DES 3.5X 8 (m), STENT SYNERGY DES 3.5X28 (d).  D2 PCI: STENT SYNERGY DES 2.5X24.  (UNABL TO REWIRE & EXPAND - TO OF DIAG AT END OF CASE)  . CORONARY STENT INTERVENTION N/A 07/03/2018   Procedure: CORONARY STENT INTERVENTION;  Surgeon: Leonie Man, MD;  Location: [facility] IN[facility]ASIVE CV LAB;; PTCA & overlapping DES PCI (overlaps prior stents distally) - Synergy DES 3 x 12 (3.6 mm @ overlap -- 3.3 mm distally)>  . LEFT HEART CATH AND CORONARY ANGIOGRAPHY N/A 10/10/2017   Procedure: LEFT HEART CATH AND CORONARY ANGIOGRAPHY;  Surgeon: Leonie Man, MD;  Location: [place] [facility];  Service: Cardiovascular;  Laterality: N/A; -patent RCA stents with mild in-stent restenosis. [place] 1,1,1 mLD-D2 29% (complicated by dissection/intramural thrombus)  . LEFT HEART CATH AND CORONARY ANGIOGRAPHY N/A 07/03/2018   Procedure: LEFT HEART CATH AND CORONARY ANGIOGRAPHY;  Surgeon: Leonie Man, MD;  Location: [place] [facility];; 100% thrombotic occlusion distal LAD stent -- DES PCI. continued 100% D2 (previously lost). patent RCA stents, 65% OM2.  EF 30-35%.  Marland Kitchen LEFT  HEART CATHETERIZATION WITH CORONARY ANGIOGRAM N/A 02/25/2013   Procedure: LEFT HEART CATHETERIZATION WITH CORONARY ANGIOGRAM;  Surgeon: Laverda Page, MD;  Location: [facility] CATH LAB;  Service: Cardiovascular;; CTO m RCA; otherwise normal coronaries  . LEFT HEART CATHETERIZATION WITH CORONARY ANGIOGRAM N/A 04/01/2013   Procedure: LEFT HEART CATHETERIZATION WITH CORONARY ANGIOGRAM;  Surgeon: Laverda Page, MD;  Location: [facility] CATH LAB;  Service: Cardiovascular: 100% occlusion of distal RCA stent (subacute stent thrombosis -  secondary to stopping Effient).  Overlapping DES PCI  . NM MYOVIEW LTD  12/2015    EF 38%.  Hypertensive response to exercise (231/132 mmHg).  INTERMEDIATE RISK due to -diffuse hypokinesis and reduced EF.  No ischemia or infarction.  Marland Kitchen PERCUTANEOUS CORONARY  STENT INTERVENTION (PCI-S)  04/01/2013   Procedure: PERCUTANEOUS CORONARY STENT INTERVENTION (PCI-S);  Surgeon: Laverda Page, MD;  Location: [facility] CATH LAB;  Service: Cardiovascular;; PCI of distal RCA stent subacute thrombosis: Promus Premier DES 3.0 mm x 20 mm.  Marland Kitchen PERCUTANEOUS CORONARY STENT INTERVENTION (PCI-S)  02/25/2013   Procedure: PERCUTANEOUS CORONARY STENT INTERVENTION (PCI-S);  Surgeon: Laverda Page, MD;  Location: [facility] CATH LAB;  RCA CTO PCI with overlapping Promus DES: 3.0 mm x 38 mm, 3.5 mm x 46m  . RIGHT/LEFT HEART CATH AND CORONARY ANGIOGRAPHY N/A 11/04/2019   Procedure: RIGHT/LEFT HEART CATH AND CORONARY ANGIOGRAPHY;  Surgeon: HLeonie Man MD;  [facility];; Mildly elevated LVEDP.  Ostial D1 100%.  Proximal to mid LAD stent 10% ISR.  Mid and distal stent widely patent.  30% distal distance.  Ostial OM 2 55%.  Proximal to mid RCA stent 50% stenosis.  Mid to distal RCA 40%.  Suspect that reduced EF is related to hypertensive emergen  . TRANSTHORACIC ECHOCARDIOGRAM  07/04/2018   recurrent Anterior STEMI: EF 35-40%.  Apical hypokinesis.  GRII DD.  No thrombus noted.  (Improved EF from 30% up to 35-40% from previous echo)  . TRANSTHORACIC ECHOCARDIOGRAM  11/2018; 01/2019   a) TAKOTUSBO CM: EF 20-25%-diffuse HK..  Mod V thickness.  GRII DD w/ high LVEDP.  Severe LA dilation;; b) 02/14/19: Moderate LVH.  EF improved up to 45-50%.  GR 1 DD.  Severe apical akinesis.  Normal valves.  . TRANSTHORACIC ECHOCARDIOGRAM  10/31/2019   Hypertensive emergency, hypoxic/hypercapnic respiratory failure;  EF 20 to 25%.  Global HK.  Mildly dilated LV.  Mildly elevated PA pressures.  Mild LA dilation.  Mild AR.  Mildly elevated RAP-8 mmHg.=> Eventual cath showed no change  . TUBAL LIGATION  1994     OB History    Gravida  3   Para  3   Term  3   Preterm  0   AB  0   Living  3     SAB  0   IAB  0   Ectopic  0   Multiple  0   Live Births              Family  History  Problem Relation Age of Onset  . Diabetes Mother   . Diabetes Sister   . Diabetes Sister   . Diabetes Son        type 1    Social History   Tobacco Use  . Smoking status: Current Every Day Smoker    Packs/day: 0.00    Years: 22.00    Pack years: 0.00    Types: Cigarettes  . Smokeless tobacco: Never Used  . Tobacco comment: pt states  she smokes about 1-2 cigarettes per day--08/01/18  Vaping Use  . Vaping Use: Never used  Substance Use Topics  . Alcohol use: Yes    Alcohol/week: 0.0 standard drinks    Comment: 07/04/2018 "only on special occasions; maybe once/yr"  . Drug use: Yes    Types: Marijuana    Comment: 02/25/2013 "quit marijuana ~ 2001"    Home Medications Prior to Admission medications   Medication Sig Start Date End Date Taking? Authorizing Provider  atorvastatin (LIPITOR) 80 MG tablet Take 1 tablet (80 mg total) by mouth daily. 02/17/20   [name], MD  blood glucose meter kit and supplies Dispense based on patient and insurance preference. Use up to four times daily as directed. (FOR ICD-10 E10.9, E11.9). 07/06/18   Bhagat, Crista Luria, PA  Blood Glucose Monitoring Suppl (TRUE METRIX METER) DEVI 1 kit by Does not apply route 4 (four) times daily. 10/24/17   Brayton Caves, PA-C  carvedilol (COREG) 25 MG tablet Take 1 tablet (25 mg total) by mouth 2 (two) times daily. 01/08/20   Almyra Deforest, PA  dapagliflozin propanediol (FARXIGA) 10 MG TABS tablet Take 1 tablet (10 mg total) by mouth daily before breakfast. 02/17/20   [name], MD  furosemide (LASIX) 20 MG tablet Take 75m tablet by mouth daily and may take an additional 233m 4071mf weight is more than 3 to 5lbs. 06/01/20   HarLeonie ManD  gabapentin (NEURONTIN) 300 MG capsule Take 1 capsule (300 mg total) by mouth at bedtime. 02/17/20   NewCharlott RakesD  glipiZIDE (GLUCOTROL) 10 MG tablet Take 1 tablet (10 mg total) by mouth 2 (two) times daily before a meal. 11/17/19   NewCharlott RakesD   glucose blood test strip Use as instructed 11/05/19   NewCharlott RakesD  hydrALAZINE (APRESOLINE) 50 MG tablet Take 71m71mice a day.  Pt may take an additional tablet (71mg64m pt's blood pressure is greater than 171mm450TUUEolic (top number). 06/01/20   HardiLeonie Man insulin glargine (LANTUS SOLOSTAR) 100 UNIT/ML Solostar Pen Inject 38 Units into the skin daily. 02/17/20   NewliCharlott Rakes nitroGLYCERIN (NITROSTAT) 0.4 MG SL tablet Place 1 tablet (0.4 mg total) under the tongue every 5 (five) minutes as needed for chest pain. Reported on 11/25/2015 07/06/18   BhagaLeanor Kail pantoprazole (PROTONIX) 40 MG tablet Take 1 tablet (40 mg total) by mouth daily. 01/08/20   Meng,Almyra Deforest sacubitril-valsartan (ENTRESTO) 97-103 MG Take 1 tablet by mouth 2 (two) times daily. 01/08/20   Meng,Almyra Deforest spironolactone (ALDACTONE) 25 MG tablet Take 1 tablet (25 mg total) by mouth 2 (two) times daily. 12/24/19   LawreLendon Colonel ticagrelor (BRILINTA) 60 MG TABS tablet Take 1 tablet (60 mg total) by mouth 2 (two) times daily. 06/19/19   HardiLeonie Man TRUEPLUS LANCETS 28G MISC 28 g by Does not apply route 4 (four) times daily. 07/06/18   Rai, Ripudeep K, MDRaliegh Ip buPROPion (WELLBUTRIN SR) 150 MG 12 hr tablet Take 1 tablet (150 mg total) by mouth 2 (two) times daily. 11/17/19 07/20/20  NewliCharlott Rakes   Allergies    Patient has no known allergies.  Review of Systems   Review of Systems All other systems are reviewed and are negative for acute change except as noted in the HPI.  Physical Exam Updated Vital Signs BP (!) 156/66 (BP Location: Right Arm)   Pulse 85  Temp (!) 102.8 F (39.3 C) (Oral)   Resp 17   Ht 5' 9"  (1.753 m)   Wt 104.3 kg   LMP 10/20/2015   SpO2 98%   BMI 33.97 kg/m   Physical Exam Vitals and nursing note reviewed.  Constitutional:      General: She is not in acute distress.    Appearance: She is ill-appearing and toxic-appearing. She is not  diaphoretic.  HENT:     Head: Normocephalic and atraumatic.     Right Ear: Tympanic membrane and external ear normal.     Left Ear: Tympanic membrane and external ear normal.     Nose: Nose normal.     Mouth/Throat:     Mouth: Mucous membranes are moist.     Pharynx: Oropharynx is clear.  Eyes:     General: No scleral icterus.       Right eye: No discharge.        Left eye: No discharge.     Extraocular Movements: Extraocular movements intact.     Conjunctiva/sclera: Conjunctivae normal.     Pupils: Pupils are equal, round, and reactive to light.  Neck:     Vascular: No JVD.  Cardiovascular:     Rate and Rhythm: Normal rate and regular rhythm.     Pulses: Normal pulses.          Radial pulses are 2+ on the right side and 2+ on the left side.     Heart sounds: Normal heart sounds.  Pulmonary:     Comments: Lungs clear to auscultation in all fields. Symmetric chest rise. No wheezing, rales, or rhonchi.  Respiratory rate 18 breaths per minute. Abdominal:     Comments: Abdomen is soft, non-distended. Generalized abdominal tenderness No rigidity, no guarding. No peritoneal signs.  Bilateral CVA tenderness  Genitourinary:    Comments: Normal external genitalia. No pain with speculum insertion. Closed cervical os with normal appearance - no rash or lesions. No bleeding noted from cervix or in vaginal vault.  Thin white discharge seen in vaginal vault. On bimanual examination no adnexal tenderness or cervical motion tenderness. Chaperone Paige RN present during exam.  Musculoskeletal:        General: Normal range of motion.     Cervical back: Normal range of motion.  Skin:    General: Skin is warm and dry.     Capillary Refill: Capillary refill takes less than 2 seconds.  Neurological:     Mental Status: She is oriented to person, place, and time.     GCS: GCS eye subscore is 4. GCS verbal subscore is 5. GCS motor subscore is 6.     Comments: Fluent speech, no facial droop.   Psychiatric:        Behavior: Behavior normal.     ED Results / Procedures / Treatments   Labs (all labs ordered are listed, but only abnormal results are displayed) Labs Reviewed  WET PREP, GENITAL - Abnormal; Notable for the following components:      Result Value   WBC, Wet Prep HPF POC MANY (*)    All other components within normal limits  URINALYSIS, ROUTINE W REFLEX MICROSCOPIC - Abnormal; Notable for the following components:   APPearance HAZY (*)    Specific Gravity, Urine 1.040 (*)    Glucose, UA >=500 (*)    Hgb urine dipstick SMALL (*)    Nitrite POSITIVE (*)    Leukocytes,Ua LARGE (*)    WBC, UA >50 (*)  Bacteria, UA RARE (*)    All other components within normal limits  CBC WITH DIFFERENTIAL/PLATELET - Abnormal; Notable for the following components:   RBC 3.83 (*)    Hemoglobin 10.2 (*)    HCT 31.5 (*)    RDW 15.9 (*)    All other components within normal limits  COMPREHENSIVE METABOLIC PANEL - Abnormal; Notable for the following components:   Potassium 2.8 (*)    CO2 21 (*)    Glucose, Bld 270 (*)    Creatinine, Ser 1.11 (*)    Calcium 8.6 (*)    Albumin 2.9 (*)    Total Bilirubin 1.5 (*)    All other components within normal limits  RESP PANEL BY RT-PCR (FLU A&B, COVID) ARPGX2  CULTURE, BLOOD (ROUTINE X 2)  CULTURE, BLOOD (ROUTINE X 2)  URINE CULTURE  LACTIC ACID, PLASMA  RPR  HIV ANTIBODY (ROUTINE TESTING W REFLEX)  I-STAT BETA HCG BLOOD, ED ([facility], [facility], [facility] ONLY)  GC/CHLAMYDIA PROBE AMP ([place]) NOT AT [facility]    EKG EKG Interpretation  Date/Time:  Tuesday July 20 2020 07:47:26 EST Ventricular Rate:  89 PR Interval:    QRS Duration: 99 QT Interval:  400 QTC Calculation: 487 R Axis:   24 Text Interpretation: Sinus rhythm Left ventricular hypertrophy Nonspecific T abnrm, anterolateral leads , new since last tracing Borderline prolonged QT interval Confirmed by Dorie Rank [phone]) on 07/20/2020 8:25:30 AM   Radiology CT ABDOMEN PELVIS W  CONTRAST  Result Date: 07/20/2020 CLINICAL DATA:  Nausea and vomiting with abdominal distension EXAM: CT ABDOMEN AND PELVIS WITH CONTRAST TECHNIQUE: Multidetector CT imaging of the abdomen and pelvis was performed using the standard protocol following bolus administration of intravenous contrast. CONTRAST:  6m OMNIPAQUE IOHEXOL 300 MG/ML  SOLN COMPARISON:  None. FINDINGS: Lower chest: Lung bases are clear. There are foci of coronary artery calcification. Hepatobiliary: No focal liver lesions are appreciable. Gallbladder is absent. There is no appreciable biliary duct dilatation. Pancreas: There is no pancreatic mass or inflammatory focus. Spleen: No splenic lesions are evident. Adrenals/Urinary Tract: Adrenals bilaterally appear normal. Right kidney is subtly edematous. Areas of somewhat inhomogeneous enhancement noted in each kidney without well-defined mass or abscess on either side. There is no appreciable hydronephrosis on either side. There is no intrarenal or ureteral calculus on either side. Urinary bladder is midline with wall thickness within normal limits. Stomach/Bowel: There is no appreciable bowel wall or mesenteric thickening. There is no evident bowel obstruction. The terminal ileum appears normal. There is no evident free air or portal venous air. Vascular/Lymphatic: There is no abdominal aortic aneurysm. There is aortic atherosclerosis. Major venous structures appear patent. There is no evident adenopathy in the abdomen or pelvis. Reproductive: Uterus is anteverted.  No appreciable adnexal mass. Other: Appendix appears normal. No evident abscess or ascites in the abdomen or pelvis. There is slight fat in the umbilicus. Musculoskeletal: No blastic lytic bone lesions. No intramuscular or abdominal wall lesions are evident. IMPRESSION: 1. Subtle right renal edema with slight perinephric stranding on the right. No well-defined fluid. Subtle inhomogeneous enhancement in each kidney noted without  well-defined mass or abscess. This appearance raises concern for a degree of bilateral bacterial nephritis/pyelonephritis. Note that there is no hydronephrosis on either side. No renal or ureteral calculus on either side. 2. No bowel wall thickening or bowel obstruction. No abscess in the abdomen or pelvis. Appendix appears normal. 3. Aortic Atherosclerosis (ICD10-I70.0). There are foci of coronary artery calcification. 4. Gallbladder absent. Electronically  Signed   By: Lowella Grip III M.D.   On: 07/20/2020 09:22   DG Chest Portable 1 View  Result Date: 07/20/2020 CLINICAL DATA:  Shortness of breath, fever EXAM: PORTABLE CHEST 1 VIEW COMPARISON:  Portable exam 0813 hours compared to 02/15/2020 FINDINGS: Upper normal heart size with multiple coronary stents noted. Mediastinal contours and pulmonary vascularity normal. Minimal chronic atelectasis at RIGHT base. Remaining lungs clear. No infiltrate, pleural effusion or pneumothorax. Osseous structures unremarkable. IMPRESSION: Minimal chronic RIGHT basilar atelectasis. Electronically Signed   By: Lavonia Dana M.D.   On: 07/20/2020 08:17    Procedures Procedures (including critical care time)  Medications Ordered in ED Medications  potassium chloride 10 mEq in 100 mL IVPB (10 mEq Intravenous New Bag/Given 07/20/20 1231)  atorvastatin (LIPITOR) tablet 80 mg (has no administration in time range)  pantoprazole (PROTONIX) EC tablet 40 mg (has no administration in time range)  ticagrelor (BRILINTA) tablet 60 mg (has no administration in time range)  gabapentin (NEURONTIN) capsule 300 mg (has no administration in time range)  sodium chloride 0.9 % bolus 1,000 mL (0 mLs Intravenous Stopped 07/20/20 0911)  acetaminophen (TYLENOL) suppository 650 mg (650 mg Rectal Given 07/20/20 0828)  cefTRIAXone (ROCEPHIN) 1 g in sodium chloride 0.9 % 100 mL IVPB (0 g Intravenous Stopped 07/20/20 0911)  iohexol (OMNIPAQUE) 300 MG/ML solution 100 mL (80 mLs Intravenous  Contrast Given 07/20/20 0914)  doxycycline (VIBRA-TABS) tablet 100 mg (100 mg Oral Given 07/20/20 1034)    ED Course  I have reviewed the triage vital signs and the nursing notes.  Pertinent labs & imaging results that were available during my care of the patient were reviewed by me and considered in my medical decision making (see chart for details).  Clinical Course as of 07/20/20 1314  Tue Jul 20, 2020  0951 Temp: 98.7 F (37.1 C) afebrile after Tylenol suppository [KW]  1219 Informed by lab that wet prep was not labeled correctly and they were unable to run the specimen. I discussed this with patient and she prefers to self swab her recollection. [KW]    Clinical Course User Index [KW] Lewanda Rife   MDM Rules/Calculators/A&P                          History provided by patient and EMS with additional history obtained from chart review.    On ED arrival patient is febrile to 102.8 oral without tachycardia or tachypnea.  Does not meet SIRS criteria based on vital signs.  Patient is however ill and toxic appearing.  Will start septic work-up and order IV Rocephin with possible source of UTI, STD, PID.  On exam she is very warm to the touch.  Generalized abdominal tenderness.  No peritoneal signs. Bilateral CVA  Tenderness. Tylenol suppository given, 1L NS ordered. EKG shows sinus rhythm, nonspecific T abnormalities in the anterolateral leads, borderline prolonged QT, no STEMI.  Will hold off on medications that could further prolong QT. CBC without leukocytosis, hemoglobin consistent with baseline. CMP shows hypokalemia 2.8, elevated creatinine at 1.11, hypoalbuminemia 2.9. Normal anion gap. Will replete potassium IV, this is likely the cause of the nonspecific flattened T waves in anterolateral leads. Lactic acid within normal range. Covid and influenza tests are negative. UA is showing signs of infection with positive nitrites, large leukocytes, over 50 WBC.  Urine  culture ordered. HIV and RPR collected Pelvic exam performed with chaperone present.  Thin white discharge seen  in vaginal vault.  No bleeding.  No cervical motion or adnexal tenderness.  Exam not consistent with PID.  GC swabs collected. Wet prep not yet resulted. Patient given dose of doxycycline to start phylactic coverage for chlamydia. Wet prep is negative for trichomoniasis, yeast and BV.  I viewed pt's chest xray and it does not suggest acute infectious processes. She does have minimal chronic right basilar atelectasis. CT AP viewed by me is concerning for bilateral bacterial nephritis/pyelonephritis. Radiologist comments on subtle right renal edema with slight perinephric stranding well as subtle inhomogenous enhancement in each kidney without a well-defined mass or abscess. This case was discussed with ED attending Dr. Tomi Bamberger who grees with plan to admit. Unassigned admission. Spoke with IM service who agrees to assume care of patient and bring into the hospital for further evaluation and management.     Portions of this note were generated with [employer]. Dictation errors may occur despite best attempts at proofreading.   Final Clinical Impression(s) / ED Diagnoses Final diagnoses:  Pyelonephritis  Fever, unspecified fever cause    Rx / DC Orders ED Discharge Orders    None       Lewanda Rife 07/20/20 1315    Dorie Rank, MD 07/21/20 [phone]

## 2020-07-21 ENCOUNTER — Observation Stay (HOSPITAL_COMMUNITY): Payer: Self-pay

## 2020-07-21 DIAGNOSIS — B9620 Unspecified Escherichia coli [E. coli] as the cause of diseases classified elsewhere: Secondary | ICD-10-CM

## 2020-07-21 DIAGNOSIS — E876 Hypokalemia: Secondary | ICD-10-CM

## 2020-07-21 DIAGNOSIS — I110 Hypertensive heart disease with heart failure: Secondary | ICD-10-CM

## 2020-07-21 DIAGNOSIS — N10 Acute tubulo-interstitial nephritis: Principal | ICD-10-CM

## 2020-07-21 DIAGNOSIS — E119 Type 2 diabetes mellitus without complications: Secondary | ICD-10-CM

## 2020-07-21 DIAGNOSIS — K219 Gastro-esophageal reflux disease without esophagitis: Secondary | ICD-10-CM

## 2020-07-21 DIAGNOSIS — I5030 Unspecified diastolic (congestive) heart failure: Secondary | ICD-10-CM

## 2020-07-21 LAB — CBC
HCT: 34.7 % — ABNORMAL LOW (ref 36.0–46.0)
Hemoglobin: 10.6 g/dL — ABNORMAL LOW (ref 12.0–15.0)
MCH: 25.7 pg — ABNORMAL LOW (ref 26.0–34.0)
MCHC: 30.5 g/dL (ref 30.0–36.0)
MCV: 84 fL (ref 80.0–100.0)
Platelets: 322 10*3/uL (ref 150–400)
RBC: 4.13 MIL/uL (ref 3.87–5.11)
RDW: 15.9 % — ABNORMAL HIGH (ref 11.5–15.5)
WBC: 9.6 10*3/uL (ref 4.0–10.5)
nRBC: 0 % (ref 0.0–0.2)

## 2020-07-21 LAB — BLOOD CULTURE ID PANEL (REFLEXED) - BCID2

## 2020-07-21 LAB — BASIC METABOLIC PANEL
Anion gap: 12 (ref 5–15)
BUN: 10 mg/dL (ref 6–20)
CO2: 19 mmol/L — ABNORMAL LOW (ref 22–32)
Calcium: 8.4 mg/dL — ABNORMAL LOW (ref 8.9–10.3)
Chloride: 109 mmol/L (ref 98–111)
Creatinine, Ser: 0.99 mg/dL (ref 0.44–1.00)
GFR, Estimated: >60 mL/min (ref >60)
Glucose, Bld: 215 mg/dL — ABNORMAL HIGH (ref 70–99)
Potassium: 3.1 mmol/L — ABNORMAL LOW (ref 3.5–5.1)
Sodium: 140 mmol/L (ref 135–145)

## 2020-07-21 LAB — GLUCOSE, CAPILLARY
Glucose-Capillary: 182 mg/dL — ABNORMAL HIGH (ref 70–99)
Glucose-Capillary: 217 mg/dL — ABNORMAL HIGH (ref 70–99)
Glucose-Capillary: 195 mg/dL — ABNORMAL HIGH (ref 70–99)
Glucose-Capillary: 213 mg/dL — ABNORMAL HIGH (ref 70–99)

## 2020-07-21 LAB — TROPONIN I (HIGH SENSITIVITY)
Troponin I (High Sensitivity): 32 ng/L — ABNORMAL HIGH (ref <18)
Troponin I (High Sensitivity): 28 ng/L — ABNORMAL HIGH (ref <18)

## 2020-07-21 LAB — MAGNESIUM: Magnesium: 1.6 mg/dL — ABNORMAL LOW (ref 1.7–2.4)

## 2020-07-21 LAB — GC/CHLAMYDIA PROBE AMP (~~LOC~~) NOT AT ARMC
Chlamydia: NEGATIVE
Comment: NEGATIVE
Comment: NORMAL
Neisseria Gonorrhea: NEGATIVE

## 2020-07-21 LAB — HEMOGLOBIN A1C
Hgb A1c MFr Bld: 7.8 % — ABNORMAL HIGH (ref 4.8–5.6)
Mean Plasma Glucose: 177.16 mg/dL

## 2020-07-21 MED ORDER — NITROGLYCERIN 0.4 MG SL SUBL
SUBLINGUAL_TABLET | SUBLINGUAL | Status: AC
  Administered 2020-07-21: 0.4 mg
  Filled 2020-07-21: qty 1

## 2020-07-21 MED ORDER — LABETALOL HCL 5 MG/ML IV SOLN
INTRAVENOUS | Status: AC
  Filled 2020-07-21: qty 4

## 2020-07-21 MED ORDER — SACUBITRIL-VALSARTAN 97-103 MG PO TABS
1.0000 | ORAL_TABLET | Freq: Two times a day (BID) | ORAL | Status: DC
  Administered 2020-07-21 – 2020-07-22 (×3): 1 via ORAL
  Filled 2020-07-21 (×4): qty 1

## 2020-07-21 MED ORDER — MAGNESIUM SULFATE 2 GM/50ML IV SOLN
2.0000 g | Freq: Once | INTRAVENOUS | Status: AC
  Administered 2020-07-21: 2 g via INTRAVENOUS
  Filled 2020-07-21: qty 50

## 2020-07-21 MED ORDER — IPRATROPIUM-ALBUTEROL 0.5-2.5 (3) MG/3ML IN SOLN
3.0000 mL | Freq: Four times a day (QID) | RESPIRATORY_TRACT | Status: DC | PRN
  Administered 2020-07-21: 3 mL via RESPIRATORY_TRACT

## 2020-07-21 MED ORDER — NITROGLYCERIN IN D5W 200-5 MCG/ML-% IV SOLN: 0.0000 ug/min | INTRAVENOUS | Status: DC

## 2020-07-21 MED ORDER — SODIUM CHLORIDE 0.9 % IV SOLN
2.0000 g | INTRAVENOUS | Status: DC
  Administered 2020-07-21 – 2020-07-22 (×2): 2 g via INTRAVENOUS
  Filled 2020-07-21 (×3): qty 20

## 2020-07-21 MED ORDER — CARVEDILOL 25 MG PO TABS
25.0000 mg | ORAL_TABLET | Freq: Two times a day (BID) | ORAL | Status: DC
  Administered 2020-07-21 – 2020-07-22 (×3): 25 mg via ORAL
  Filled 2020-07-21 (×3): qty 1

## 2020-07-21 MED ORDER — NITROGLYCERIN 0.4 MG SL SUBL: 0.4000 mg | SUBLINGUAL_TABLET | SUBLINGUAL | Status: DC | PRN

## 2020-07-21 MED ORDER — SPIRONOLACTONE 25 MG PO TABS
25.0000 mg | ORAL_TABLET | Freq: Two times a day (BID) | ORAL | Status: DC
  Administered 2020-07-21 – 2020-07-22 (×3): 25 mg via ORAL
  Filled 2020-07-21 (×3): qty 1

## 2020-07-21 MED ORDER — FUROSEMIDE 10 MG/ML IJ SOLN
60.0000 mg | Freq: Once | INTRAMUSCULAR | Status: AC
  Administered 2020-07-21: 60 mg via INTRAVENOUS
  Filled 2020-07-21: qty 6

## 2020-07-21 MED ORDER — POTASSIUM CHLORIDE 20 MEQ PO PACK: 40.0000 meq | PACK | Freq: Two times a day (BID) | ORAL | Status: DC

## 2020-07-21 MED ORDER — FUROSEMIDE 10 MG/ML IJ SOLN
INTRAMUSCULAR | Status: AC
  Administered 2020-07-21: 40 mg
  Filled 2020-07-21: qty 4

## 2020-07-21 MED ORDER — POTASSIUM CHLORIDE CRYS ER 20 MEQ PO TBCR: 40.0000 meq | EXTENDED_RELEASE_TABLET | Freq: Two times a day (BID) | ORAL | Status: DC

## 2020-07-21 MED ORDER — POTASSIUM CHLORIDE CRYS ER 20 MEQ PO TBCR
40.0000 meq | EXTENDED_RELEASE_TABLET | Freq: Four times a day (QID) | ORAL | Status: AC
  Administered 2020-07-21 (×2): 40 meq via ORAL
  Filled 2020-07-21 (×2): qty 2

## 2020-07-21 MED ORDER — IPRATROPIUM-ALBUTEROL 0.5-2.5 (3) MG/3ML IN SOLN
RESPIRATORY_TRACT | Status: AC
  Filled 2020-07-21: qty 3

## 2020-07-21 MED ORDER — LABETALOL HCL 5 MG/ML IV SOLN
10.0000 mg | Freq: Once | INTRAVENOUS | Status: AC
  Administered 2020-07-21: 10 mg via INTRAVENOUS

## 2020-07-21 NOTE — Progress Notes (Signed)
   07/21/20 0244  Assess: MEWS Score  BP (!) 252/113  Pulse Rate 95  Resp (!) 33  SpO2 (!) 88 %  O2 Device Nasal Cannula  O2 Flow Rate (L/min) 6 L/min  Assess: MEWS Score  MEWS Temp 0  MEWS Systolic 2  MEWS Pulse 0  MEWS RR 2  MEWS LOC 0  MEWS Score 4  MEWS Score Color Red  Assess: if the MEWS score is Yellow or Red  Were vital signs taken at a resting state? Yes  Focused Assessment Change from prior assessment (see assessment flowsheet)  Early Detection of Sepsis Score *See Row Information* High  MEWS guidelines implemented *See Row Information* Yes  Treat  MEWS Interventions Administered prn meds/treatments;Escalated (See documentation below);Consulted Respiratory Therapy;Other (Comment)  Pain Scale 0-10  Pain Score 6  Pain Type Acute pain  Pain Location Chest  Pain Orientation Mid  Pain Descriptors / Indicators Pressure  Pain Intervention(s) Medication (See eMAR);MD notified (Comment)  Multiple Pain Sites No  Take Vital Signs  Increase Vital Sign Frequency  Red: Q 1hr X 4 then Q 4hr X 4, if remains red, continue Q 4hrs  Escalate  MEWS: Escalate Red: discuss with charge nurse/RN and provider, consider discussing with RRT  Notify: Charge Nurse/RN  Name of Charge Nurse/RN Notified Zigmund Daniel RN  Date Charge Nurse/RN Notified 07/21/20  Time Charge Nurse/RN Notified 0225  Notify: Provider  Provider Name/Title Dr Chen/ Dr Court Joy  Date Provider Notified 07/21/20  Time Provider Notified 0229  Notification Type Page  Notification Reason Change in status  Response See new orders  Date of Provider Response 07/21/20  Time of Provider Response 0230  Notify: Rapid Response  Name of Rapid Response RN Notified Mindy RN  Date Rapid Response Notified 07/21/20  Time Rapid Response Notified 0224  Document  Patient Outcome Transferred/level of care increased  Progress note created (see row info) Yes

## 2020-07-21 NOTE — Progress Notes (Signed)
Subjective: HD#1 Overnight patient had Hypertensive emergency and flash pulmonary edema likely in the setting of holding her home anti-hypertensive medications and fluid overload. Given her respiratory decompensation and extensive b/l crackles, ordered one dose of IV lasix 40 mg and Bipap. Also given one dose of IV labetalol 10 mg with improvement of her pressures (will avoid further use given her history of HF and low EF). EKG showed NSR, no ST elevations or t wave inversions.  [name] is resting comfortably. She endorses back pain worse with sitting but denies abdominal pain, CP, vomiting. States her last bowel movement was yesterday.   Objective:  Vital signs in last 24 hours: Vitals:   07/21/20 0339 07/21/20 0411 07/21/20 0500 07/21/20 0737  BP: (!) 189/87  (!) 161/76   Pulse: 82  73 90  Resp: (!) 32  (!) 22 20  Temp: (!) 100.5 F (38.1 C)     TempSrc: Axillary     SpO2: 100%  100% 96%  Weight:  100.3 kg    Height:       CBC Latest Ref Rng & Units 07/21/2020 07/20/2020 07/20/2020  WBC 4.0 - 10.5 K/uL 9.6 10.9(H) 9.0  Hemoglobin 12.0 - 15.0 g/dL 10.6(L) 10.0(L) 10.2(L)  Hematocrit 36.0 - 46.0 % 34.7(L) 32.2(L) 31.5(L)  Platelets 150 - 400 K/uL 322 333 323   BMP Latest Ref Rng & Units 07/21/2020 07/20/2020 07/20/2020  Glucose 70 - 99 mg/dL 161(W215(H) 960(A191(H) 540(J270(H)  BUN 6 - 20 mg/dL 10 11 13   Creatinine 0.44 - 1.00 mg/dL 8.110.99 9.141.00 7.82(N1.11(H)  BUN/Creat Ratio 9 - 23 - - -  Sodium 135 - 145 mmol/L 140 138 140  Potassium 3.5 - 5.1 mmol/L 3.1(L) 3.0(L) 2.8(L)  Chloride 98 - 111 mmol/L 109 106 105  CO2 22 - 32 mmol/L 19(L) 23 21(L)  Calcium 8.9 - 10.3 mg/dL 5.6(O8.4(L) 8.1(L) 8.6(L)   Physical exam General: Well developed,  middle age female lying comfortably in bed, NAD Cardiovascular:Normal rate, regular rhythm.  No murmurs, rubs, or gallops Pulmonary : Inspiratory crackles in all lung fields, on 6L O2 [place] Abdominal: soft, nontender,  bowel sounds present, Bilateral CVA tenderness   Ext: No LE edema, no deformity Psychiatric/Behavioral:normal mood, normal behavior  Neuro: AAO*3   Assessment/Plan:  Amy Chaney is a 49 year old female with past medical history of combined systolic and diastolic CHF, HLD, HTN, tobacco use disorder, and type 2 diabetes who is admitted for Bilateral pyelonephritis and found to have hypertensive emergency and flash pulmonary edema which is improving and bacteremia.   Active Problems:   Pyelonephritis  Pyelonephritis  E.coli Bacteremia Patient's blood culture is growing gram negative rods.  Patient febrile 100.5, no chills.  WBC 9.6 trending down from 10.9. UA positive for nitrites and leukocytes.   CT abdomen and pelvis read a concern for a degree of bilateral bacterial nephritis/pyelonephritis.  - Recieved one dose of Ceftriaxone in ED.  - Continue Ceftriaxone 1 g daily - Follow Urine Cultures - F/U sensitivities  Hypertension Overnight patient had Hypertensive emergency and flash pulmonary edema likely in the setting of holding her home anti-hypertensive medications and fluid overload. Given her respiratory decompensation and extensive b/l crackles, ordered one dose of IV lasix 40 mg and Bipap. Also given one dose of IV labetalol 10 mg with improvement of her pressures (will avoid further use given her history of HF and low EF). Troponin are flattening. EKG showed NSR, no ST elevations or t wave inversions. Was successfully able to wean  off her BiPAP and saturating well~ 100% on 6L [place] . Her blood pressure is trending down nicely and her oral home ant-hypertensive's has been restarted.   -Restart Coreg 25 mg BID -Restart Entresto BID -Restart Spironolactone 25 mg BID -Continue Hyralazine 50 mg TID   Hypokalemia- Improving K+ 3.1 this morning  - Kcl 40 meq BID - Follow BMP    STD? Patient has not been sexually active in 6 months until having intercourse 2 weeks ago. Her symptoms started shortly after on 12/13. Pelvic  exam completed in the ED , noted thin white discharge. Given one dose of doxycycline in ED. Wet prep shows many WBC's, no clue cells, Trich or Yeast. RPR non-reactive, HIV non-reactive  - s/p 1 dose of Doxycycline 100 mg - F/u GC/Chlamydia   Type 2 DM On Farxiga, Glipizide, and Lantus 38 units qhs. Current Hgb A1c 10.6  - Lantus 30 units - SSI-M - QHS correction scale  HFpEF Has inspiratory crackles over lung fields this morning. Received IV lasix last night with out put of ~700 ml.  Planning to do one dose of IV lasix  Today and if pt does well, will consider starting her home oral dose tomorrow.   - IV lasix 60 mg -Strict I's and O's   Hyperlipidemia  -C/w Lipitor 80 mg   GERD - continue Protonix   Prior to Admission Living Arrangement: Home Anticipated Discharge Location: Home Barriers to Discharge: Ongoing medical management Dispo: Anticipated discharge in approximately 1-2 day(s).   Amy Junes, MD 07/21/2020, 11:14 AM Pager: [phone] After 5pm on weekdays and 1pm on weekends: On Call pager [phone]

## 2020-07-21 NOTE — Progress Notes (Addendum)
Paged by RN that patient reported crushing chest pain with systolic BP in the 258N. Patient valuated at bedside, appeared in acute distress on NRB with SpO2 at 90% and blood pressure in the 250s. She reported mid sternal chest pain, sharp in nature. Reports this has happened in the past typically when she has increased fluid. She states the chest pain improved with nitroglycerin.   Physical Exam  Vitals:   07/21/20 0246 07/21/20 0258  BP: (!) 194/96 (!) 195/98  Pulse: 92 90  Resp: (!) 30 (!) 28  Temp:    SpO2: 94% 94%   Chest- no tenderness to palpation, RRR, no murmurs Lungs- resp distress on NRB, bilateral crackles appreciated in all lung fields Ext- no pitting edema    Assessment/Plan: Hypertensive emergency and flash pulmonary edema likely in the setting of holding her home anti-hypertensive medications and fluid overload. Of note, she only received ~500 ml of IVF with her potassium runs. She was unable to tolerate more than 3 runs due to burning sensation. She also was not given Mg. Given her respiratory decompensation and extensive b/l crackles, ordered one dose of IV lasix 40 mg and Bipap. Also given one dose of IV labetalol 10 mg with improvement of her pressures (will avoid further use given her history of HF and low EF). EKG showed NSR, no ST elevations or t wave inversions.   - monitor BP - bipap - IV lasix 40 mg once - IV labetalol 10 mg once - once she is off bipap, resume home BP medications

## 2020-07-21 NOTE — Significant Event (Addendum)
Rapid Response Event Note   Reason for Call :  Chest pain/SOB  Initial Focused Assessment:  Pt sitting up in bed in respiratory distress. Pt saying "I need a ventilator, I can't do this." Lungs with crackles t/o all lung fields. Skin hot to touch, diaphoretic.   T-100.5, HR-94, SBP-250s, RR-30s, SpO2-88% on RA  Pt placed on NRB mask with SpO2 increasing to 94%.  1SL NTG given PTA RRT and pt reports no chest pain on my arrival.    Interventions:  NTG SL x 1-chest pain resolved 40mg  Lasix IV 10mg  labetolol IV-SBP down to 190s Bipap EKG-NSR PCXR-CHF which has developed since yesterday. Tx to [place] of Care:  Transfer to PCU. Bipap while in distress. Monitor response to lasix. Continue to monitor pt closely. Call RRT if further assistance needed.   Event Summary:   MD Notified: Dr. Laural Golden notified and came to bedside Call Carlton, Roshawna Colclasure Anderson, RN

## 2020-07-21 NOTE — Progress Notes (Signed)
Patient weaned from bipap to 8 liters of oxygen. Patient denies distress, no signs of distress apparent.

## 2020-07-21 NOTE — Progress Notes (Signed)
Rt note.  Pt. Refusing BIPAP tonight. VS stable, 100%, R20. Told pt. And RN to let me know if WOB increases and I will place bipap back on. RT will continue to monitor.

## 2020-07-21 NOTE — Progress Notes (Signed)
Transported patient to [id number].

## 2020-07-21 NOTE — Progress Notes (Signed)
Skin assessment not completed due to current unstable respiratory status and ongoing hypertension; unsafe to turn patient at this time.

## 2020-07-21 NOTE — Progress Notes (Signed)
PHARMACY - PHYSICIAN COMMUNICATION CRITICAL VALUE ALERT - BLOOD CULTURE IDENTIFICATION (BCID)  Amy Chaney is an 49 y.o. female who presented to [facility] on 07/20/2020 with a chief complaint of shortness of breath, fever  Assessment:  Likely urinary source of bacteremia, WBC 10.9  Name of physician (or Provider) Contacted: Dr. Court Joy ([facility])  Current antibiotics: Ceftriaxone 1g IV q24h  Changes to prescribed antibiotics recommended:  Inc ceftriaxone to 2g IV q24h  Results for orders placed or performed during the hospital encounter of 07/20/20  Blood Culture ID Panel (Reflexed) (Collected: 07/20/2020  8:00 AM)  Result Value Ref Range   Enterococcus faecalis NOT DETECTED NOT DETECTED   Enterococcus Faecium NOT DETECTED NOT DETECTED   Listeria monocytogenes NOT DETECTED NOT DETECTED   Staphylococcus species NOT DETECTED NOT DETECTED   Staphylococcus aureus (BCID) NOT DETECTED NOT DETECTED   Staphylococcus epidermidis NOT DETECTED NOT DETECTED   Staphylococcus lugdunensis NOT DETECTED NOT DETECTED   Streptococcus species NOT DETECTED NOT DETECTED   Streptococcus agalactiae NOT DETECTED NOT DETECTED   Streptococcus pneumoniae NOT DETECTED NOT DETECTED   Streptococcus pyogenes NOT DETECTED NOT DETECTED   A.calcoaceticus-baumannii NOT DETECTED NOT DETECTED   Bacteroides fragilis NOT DETECTED NOT DETECTED   Enterobacterales DETECTED (A) NOT DETECTED   Enterobacter cloacae complex NOT DETECTED NOT DETECTED   Escherichia coli DETECTED (A) NOT DETECTED   Klebsiella aerogenes NOT DETECTED NOT DETECTED   Klebsiella oxytoca NOT DETECTED NOT DETECTED   Klebsiella pneumoniae NOT DETECTED NOT DETECTED   Proteus species NOT DETECTED NOT DETECTED   Salmonella species NOT DETECTED NOT DETECTED   Serratia marcescens NOT DETECTED NOT DETECTED   Haemophilus influenzae NOT DETECTED NOT DETECTED   Neisseria meningitidis NOT DETECTED NOT DETECTED   Pseudomonas aeruginosa NOT DETECTED NOT  DETECTED   Stenotrophomonas maltophilia NOT DETECTED NOT DETECTED   Candida albicans NOT DETECTED NOT DETECTED   Candida auris NOT DETECTED NOT DETECTED   Candida glabrata NOT DETECTED NOT DETECTED   Candida krusei NOT DETECTED NOT DETECTED   Candida parapsilosis NOT DETECTED NOT DETECTED   Candida tropicalis NOT DETECTED NOT DETECTED   Cryptococcus neoformans/gattii NOT DETECTED NOT DETECTED   CTX-M ESBL NOT DETECTED NOT DETECTED   Carbapenem resistance IMP NOT DETECTED NOT DETECTED   Carbapenem resistance KPC NOT DETECTED NOT DETECTED   Carbapenem resistance NDM NOT DETECTED NOT DETECTED   Carbapenem resist OXA 48 LIKE NOT DETECTED NOT DETECTED   Carbapenem resistance VIM NOT DETECTED NOT DETECTED    Narda Bonds 07/21/2020  2:44 AM

## 2020-07-21 NOTE — Progress Notes (Signed)
Pt BP was high, spoke with DR Bridgett Larsson, as per him to monitor and notify  if above 756 systolic. Pt denies pain and SPO2 at 100% on Bipap. Pt has no complains at this time, pt resting Ok. PT urinated 784ml after lasix. Will monitor.    07/21/20 0339  Assess: MEWS Score  Temp (!) 100.5 F (38.1 C)  BP (!) 189/87 (use of bipap)  Pulse Rate 82  ECG Heart Rate 82  Resp (!) 32  Level of Consciousness Alert  SpO2 100 %  O2 Device Bi-PAP  FiO2 (%) 100 %  Assess: MEWS Score  MEWS Temp 1  MEWS Systolic 0  MEWS Pulse 0  MEWS RR 2  MEWS LOC 0  MEWS Score 3  MEWS Score Color Yellow  Assess: if the MEWS score is Yellow or Red  Were vital signs taken at a resting state? Yes  Focused Assessment No change from prior assessment (new admit)  Early Detection of Sepsis Score *See Row Information* High  MEWS guidelines implemented *See Row Information* Yes  Treat  MEWS Interventions Administered prn meds/treatments  Take Vital Signs  Increase Vital Sign Frequency  Yellow: Q 2hr X 2 then Q 4hr X 2, if remains yellow, continue Q 4hrs  Escalate  MEWS: Escalate Yellow: discuss with charge nurse/RN and consider discussing with provider and RRT  Notify: Charge Nurse/RN  Name of Charge Nurse/RN Notified MaThew RN  Date Charge Nurse/RN Notified 07/21/20  Time Charge Nurse/RN Notified 0300  Notify: Provider  Provider Name/Title DR Bridgett Larsson  Date Provider Notified 07/21/20  Notification Type Call  Notification Reason Other (Comment) (Transfer From [facility], DR was Aware.)  Response See new orders  Notify: Rapid Response  Name of Rapid Response RN Notified MINDY (PT came with the Rapid Response to the floor)  Document  Patient Outcome Stabilized after interventions  Progress note created (see row info) Yes

## 2020-07-21 NOTE — Progress Notes (Addendum)
Pt c/o trouble breathing & chest pain. Crackles noted upon auscultation.V/S checked BP=252/113; HR=95; O2 Sats=89%on RA. Placed pt on O2 [place] @ 4L/min with Sats of 90-91%. RT, Rapid response (Mindy RN) & MD  (Dr Chen/ Dr Roxanne Mins. Breathing treatment given by RT. Ntg SL given x1 dose. Changed [place] to NRB with Sats=92-94%.Lasix 40 mg IV followed by labetalol 10 mg IV given as ordered. EKG done. Currently pt is chest pain free. Changed NRB to BIPAP by RT as per MD. BP=195/98; HR=90; R=28; O2 Sats=100% on BIPAP. Report given to [employer]. Daughter Ubaldo Glassing) informed. Transferred to higher level of care (Progressive)to RM 3E14 without difficulty by RT, Rapid response & NT.

## 2020-07-22 ENCOUNTER — Other Ambulatory Visit (HOSPITAL_COMMUNITY): Payer: Self-pay | Admitting: Student in an Organized Health Care Education/Training Program

## 2020-07-22 DIAGNOSIS — I10 Essential (primary) hypertension: Secondary | ICD-10-CM

## 2020-07-22 LAB — BASIC METABOLIC PANEL
Anion gap: 11 (ref 5–15)
BUN: 9 mg/dL (ref 6–20)
CO2: 23 mmol/L (ref 22–32)
Calcium: 8.1 mg/dL — ABNORMAL LOW (ref 8.9–10.3)
Chloride: 104 mmol/L (ref 98–111)
Creatinine, Ser: 0.79 mg/dL (ref 0.44–1.00)
GFR, Estimated: >60 mL/min (ref >60)
Glucose, Bld: 156 mg/dL — ABNORMAL HIGH (ref 70–99)
Potassium: 3 mmol/L — ABNORMAL LOW (ref 3.5–5.1)
Sodium: 138 mmol/L (ref 135–145)

## 2020-07-22 LAB — URINE CULTURE: Culture: 30000 — AB

## 2020-07-22 LAB — CULTURE, BLOOD (ROUTINE X 2): Special Requests: ADEQUATE

## 2020-07-22 LAB — CBC
HCT: 28.1 % — ABNORMAL LOW (ref 36.0–46.0)
Hemoglobin: 9.4 g/dL — ABNORMAL LOW (ref 12.0–15.0)
MCH: 26.6 pg (ref 26.0–34.0)
MCHC: 33.5 g/dL (ref 30.0–36.0)
MCV: 79.4 fL — ABNORMAL LOW (ref 80.0–100.0)
Platelets: 327 K/uL (ref 150–400)
RBC: 3.54 MIL/uL — ABNORMAL LOW (ref 3.87–5.11)
RDW: 15.9 % — ABNORMAL HIGH (ref 11.5–15.5)
WBC: 9.8 K/uL (ref 4.0–10.5)
nRBC: 0 % (ref 0.0–0.2)

## 2020-07-22 LAB — GLUCOSE, CAPILLARY
Glucose-Capillary: 197 mg/dL — ABNORMAL HIGH (ref 70–99)
Glucose-Capillary: 266 mg/dL — ABNORMAL HIGH (ref 70–99)

## 2020-07-22 LAB — MAGNESIUM: Magnesium: 2.2 mg/dL (ref 1.7–2.4)

## 2020-07-22 MED ORDER — POTASSIUM CHLORIDE 20 MEQ PO PACK
40.0000 meq | PACK | Freq: Two times a day (BID) | ORAL | Status: DC
  Administered 2020-07-22: 40 meq via ORAL
  Filled 2020-07-22: qty 2

## 2020-07-22 MED ORDER — CEFDINIR 300 MG PO CAPS: 300.0000 mg | ORAL_CAPSULE | Freq: Two times a day (BID) | ORAL | 0 refills | Status: DC

## 2020-07-22 MED ORDER — NICOTINE 14 MG/24HR TD PT24: 14.0000 mg | MEDICATED_PATCH | Freq: Every day | TRANSDERMAL | 0 refills | Status: DC

## 2020-07-22 MED FILL — CEFDINIR 300 MG CAPSULE: 300 | 11 days supply | Qty: 22 | Fill #0

## 2020-07-22 MED FILL — NICOTINE 14 MG/24HR PATCH: 14 | 28 days supply | Qty: 28 | Fill #0

## 2020-07-22 NOTE — Progress Notes (Signed)
Pt received discharge instructions and does not have any additional questions or concerns at this time. Pt encouraged to take her medications as prescribed and follow up with scheduled appointments.

## 2020-07-22 NOTE — Progress Notes (Signed)
Subjective: HD#2  This morning, patient reports that she has not had a bowel movement recently. She is requesting coffee to help with this. She denies abdominal pain, flank pain, nausea and  vomiting. She has not gotten out of bed today. She understands and agrees with the plan for today and denies further questions.  Objective:  Vital signs in last 24 hours: Vitals:   07/21/20 2000 07/22/20 0522 07/22/20 0526 07/22/20 0800  BP: (!) 173/72 (!) 154/58  (!) 172/82  Pulse: 89 74  78  Resp: 18 18  (!) 23  Temp: 98.2 F (36.8 C) 97.9 F (36.6 C)    TempSrc: Oral Oral    SpO2: 99% 100%  97%  Weight:   100 kg   Height:       CBC Latest Ref Rng & Units 07/22/2020 07/21/2020 07/20/2020  WBC 4.0 - 10.5 K/uL 9.8 9.6 10.9(H)  Hemoglobin 12.0 - 15.0 g/dL 9.4(L) 10.6(L) 10.0(L)  Hematocrit 36.0 - 46.0 % 28.1(L) 34.7(L) 32.2(L)  Platelets 150 - 400 K/uL 327 322 333   BMP Latest Ref Rng & Units 07/22/2020 07/21/2020 07/20/2020  Glucose 70 - 99 mg/dL 156(H) 215(H) 191(H)  BUN 6 - 20 mg/dL 9 10 11   Creatinine 0.44 - 1.00 mg/dL 0.79 0.99 1.00  BUN/Creat Ratio 9 - 23 - - -  Sodium 135 - 145 mmol/L 138 140 138  Potassium 3.5 - 5.1 mmol/L 3.0(L) 3.1(L) 3.0(L)  Chloride 98 - 111 mmol/L 104 109 106  CO2 22 - 32 mmol/L 23 19(L) 23  Calcium 8.9 - 10.3 mg/dL 8.1(L) 8.4(L) 8.1(L)   Physical exam General: Well developed,  middle age female lying comfortably in bed, NAD Cardiovascular:Normal rate, regular rhythm.  No murmurs, rubs, or gallops Pulmonary : Lungs clear to auscultation bilaterally , saturating well on room air Abdominal: soft, nontender,  bowel sounds present, No CVA tenderness. Psychiatric: Normal mood, normal behavior  Neuro: AAO*3   Assessment/Plan:  Amy Chaney is a 49 year old female with past medical history of combined systolic and diastolic CHF, HLD, HTN, tobacco use disorder, and type 2 diabetes who is admitted for Bilateral pyelonephritis and found to have  hypertensive emergency and flash pulmonary edema which is improving and bacteremia.   Active Problems:   Pyelonephritis   Acute pyelonephritis  Pyelonephritis  E.coli Bacteremia Patient's blood culture is growing gram negative rods.  Patient afebrile, no chills.  WBC 9.8 , stable. UA positive for nitrites and leukocytes. Urine culture growing E.coli. CT abdomen and pelvis read a concern for a degree of bilateral bacterial nephritis/pyelonephritis. Patient improving and plan is to discharge later today with oral antibiotics.   - Ceftriaxone 2 g daily - F/U Blood culture - F/U Urine culture  Hypertension Patient has h/o resistant hypertension. Home medications are re-started and she has a close follow up with outpatient cardiologist.   -Coreg 25 mg BID - Entresto BID -Spironolactone 25 mg BID - Hyralazine 50 mg TID   Hypokalemia- Improving K+ 3.0 this morning  - Kcl 40 meq BID - Follow BMP     Type 2 DM On Farxiga, Glipizide, and Lantus 38 units qhs. Current Hgb A1c 10.6  - Lantus 30 units - SSI-M - QHS correction scale  HFpEF Lung sounds clear today.  Received IV lasix yesterday and had an out put of ~1.4 L. Will resume on home medications at discharge.   -Strict I's and O's   Hyperlipidemia  -C/w Lipitor 80 mg   GERD - continue Protonix  Prior to Admission Living Arrangement: Home Anticipated Discharge Location: Home Barriers to Discharge: None Dispo: Anticipated discharge in approximately 0 day(s).   Honor Junes, MD 07/22/2020, 10:28 AM Pager: [phone] After 5pm on weekdays and 1pm on weekends: On Call pager [phone]

## 2020-07-23 NOTE — Discharge Summary (Signed)
Name: Amy Chaney MRN: [id number] DOB: 1970/08/03 49 y.o. PCP: [name], MD  Date of Admission: 07/20/2020  7:42 AM Date of Discharge: 07/22/2020 Attending Physician: Dr. Dareen Piano Discharge Diagnosis: 1. Bilateral Pyelonephritis 2. E.coli bacteremia 3. Hypokalemia 4. Hypertension  Discharge Medications: Allergies as of 07/22/2020   No Known Allergies     Medication List    TAKE these medications   atorvastatin 80 MG tablet Commonly known as: LIPITOR Take 1 tablet (80 mg total) by mouth daily.   blood glucose meter kit and supplies Dispense based on patient and insurance preference. Use up to four times daily as directed. (FOR ICD-10 E10.9, E11.9).   carvedilol 25 MG tablet Commonly known as: COREG Take 1 tablet (25 mg total) by mouth 2 (two) times daily.   cefdinir 300 MG capsule Commonly known as: OMNICEF Take 1 capsule (300 mg total) by mouth 2 (two) times daily for 11 days.   dapagliflozin propanediol 10 MG Tabs tablet Commonly known as: Farxiga Take 1 tablet (10 mg total) by mouth daily before breakfast.   furosemide 20 MG tablet Commonly known as: LASIX Take 18m tablet by mouth daily and may take an additional 221m 4049mf weight is more than 3 to 5lbs. What changed:   how much to take  how to take this  when to take this  additional instructions   gabapentin 300 MG capsule Commonly known as: NEURONTIN Take 1 capsule (300 mg total) by mouth at bedtime.   glipiZIDE 10 MG tablet Commonly known as: GLUCOTROL Take 1 tablet (10 mg total) by mouth 2 (two) times daily before a meal.   glucose blood test strip Use as instructed   hydrALAZINE 50 MG tablet Commonly known as: APRESOLINE Take 49m30mice a day.  Pt may take an additional tablet (49mg63m pt's blood pressure is greater than 149mm606TKZSolic (top number). What changed:   how much to take  how to take this  when to take this  additional instructions   Lantus SoloStar  100 UNIT/ML Solostar Pen Generic drug: insulin glargine Inject 38 Units into the skin daily.   naproxen sodium 220 MG tablet Commonly known as: ALEVE Take 220 mg by mouth daily as needed (pain).   nicotine 14 mg/24hr patch Commonly known as: NICODERM CQ - dosed in mg/24 hours Place 1 patch (14 mg total) onto the skin daily.   nitroGLYCERIN 0.4 MG SL tablet Commonly known as: NITROSTAT Place 1 tablet (0.4 mg total) under the tongue every 5 (five) minutes as needed for chest pain. Reported on 11/25/2015 What changed:   reasons to take this  additional instructions   pantoprazole 40 MG tablet Commonly known as: PROTONIX Take 1 tablet (40 mg total) by mouth daily.   sacubitril-valsartan 97-103 MG Commonly known as: ENTRESTO Take 1 tablet by mouth 2 (two) times daily.   spironolactone 25 MG tablet Commonly known as: ALDACTONE Take 1 tablet (25 mg total) by mouth 2 (two) times daily.   ticagrelor 60 MG Tabs tablet Commonly known as: BRILINTA Take 1 tablet (60 mg total) by mouth 2 (two) times daily.   True Metrix Meter Devi 1 kit by Does not apply route 4 (four) times daily.   TRUEplus Lancets 28G Misc 28 g by Does not apply route 4 (four) times daily.       Disposition and follow-up:   [facility]  At the hospital follow up visit  please address:  1.  - Bilateral pyelonephritis and E. Coli bacteremia: Please complete your antibiotic course, no further work up at this time. Please see your PCP if needed - Hypertension- No further work up at this time, Please follow up with your outpatient cardiologist closely.  - Hypokalemia Improved, no further follow up at this time.  2.  Labs / imaging needed at time of follow-up: None  3.  Pending labs/ test needing follow-up: BP monitoring, potassium levels  Follow-up Appointments:  Follow-up Information    [place]  on 08/20/2020.   Why: @9 :30am Contact information: [place] [id number] [place] [facility] Course by problem list: Amy Chaney is a 49 year old female with past medical history of combined systolic and diastolic CHF, HLD, HTN, tobacco use disorder, and type 2 diabetes who presents with a week of dysuria , recent fever/chills, and one episode of nausea /vomiting admitted for Bilateral pyelonephritis.   Pyelonephritis  E.coli Bacteremia Patient's blood culture grew gram negative rods. UA positive for nitrites and leukocytes.  CT abdomen and pelvis read a concern for a degree of bilateral bacterial nephritis/pyelonephritis.Recieved one dose of Ceftriaxone in ED. Continued on Ceftriaxone 1 g daily in hospital and plan is to discharge on oral cefdinir to complete a 14-day course of antibiotics.    Hypertension Overnight after admission patient had Hypertensive emergency and flash pulmonary edema likely in the setting of holding her home anti-hypertensive medications and fluid overload. Given her respiratory decompensation and extensive b/l crackles, ordered one dose of IV lasix 40 mg and Bipap. Also given one dose of IV labetalol 10 mg with improvement of her pressures (will avoid further use given her history of HF and low EF). Troponin's flattened. EKG showed NSR, no ST elevations or t wave inversions. Was successfully able to wean off her BiPAP and saturating well~ 100% on 6L [place] . Her blood pressure trended down nicely and her oral home ant-hypertensive's were restarted.      Hypokalemia- Improved  Elevated Cr - K 2.8, given 4 runs of KCl in ED  - BMP now to check K and see Cr trend, will give fluids if patient not keeping up PO intake.    STD- None  Discharge Vitals:   BP (!) 147/69 (BP Location: Right Arm)   Pulse 66   Temp 98.6 F (37 C) (Oral)   Resp 18   Ht 5' 9"  (1.753 m)   Wt 100 kg   LMP 10/20/2015   SpO2 100%    BMI 32.56 kg/m   Pertinent Labs, Studies, and Procedures:  CBC Latest Ref Rng & Units 07/22/2020 07/21/2020 07/20/2020  WBC 4.0 - 10.5 K/uL 9.8 9.6 10.9(H)  Hemoglobin 12.0 - 15.0 g/dL 9.4(L) 10.6(L) 10.0(L)  Hematocrit 36.0 - 46.0 % 28.1(L) 34.7(L) 32.2(L)  Platelets 150 - 400 K/uL 327 322 333   BMP Latest Ref Rng & Units 07/22/2020 07/21/2020 07/20/2020  Glucose 70 - 99 mg/dL 156(H) 215(H) 191(H)  BUN 6 - 20 mg/dL 9 10 11   Creatinine 0.44 - 1.00 mg/dL 0.79 0.99 1.00  BUN/Creat Ratio 9 - 23 - - -  Sodium 135 - 145 mmol/L 138 140 138  Potassium 3.5 - 5.1 mmol/L 3.0(L) 3.1(L) 3.0(L)  Chloride 98 - 111 mmol/L 104 109 106  CO2 22 - 32 mmol/L 23 19(L) 23  Calcium 8.9 - 10.3 mg/dL 8.1(L) 8.4(L)  8.1(L)     Discharge Instructions: Discharge Instructions    Diet - low sodium heart healthy   Complete by: As directed    Discharge instructions   Complete by: As directed    Dear Amy Chaney, Amy Chaney were admitted to the hospital due to an infection in your kidneys called Pyelonephritis. While here, you were treated with antibiotics. When you go home, make sure to complete the antibiotic course as instructed below:   START Cefdinir - Take 1 tablet in the morning and another in the evening, starting on 12/24 AM. Take until the bottle is finished.   While here, your Blood pressure went really high but after re-starting your home medications, please continue close follow up with your outpatient cardiologist.  It was pleasure taking care of you!  Dr. Demaris Callander   Increase activity slowly   Complete by: As directed       Signed: Honor Junes, MD 07/23/2020, 9:03 AM   Pager: [phone]

## 2020-07-23 NOTE — Hospital Course (Signed)
Amy Chaney is a 49 year old female with past medical history of combined systolic and diastolic CHF, HLD, HTN, tobacco use disorder, and type 2 diabetes who presents with a week of dysuria , recent fever/chills, and one episode of nausea /vomiting admitted for Bilateral pyelonephritis.   Pyelonephritis  E.coli Bacteremia Patient's blood culture grew gram negative rods. UA positive for nitrites and leukocytes.  CT abdomen and pelvis read a concern for a degree of bilateral bacterial nephritis/pyelonephritis.Recieved one dose of Ceftriaxone in ED. Continued on Ceftriaxone 1 g daily in hospital and plan is to discharge on oral cefdinir to complete a 14-day course of antibiotics.    Hypertension Overnight after admission patient had Hypertensive emergency and flash pulmonary edema likely in the setting of holding her home anti-hypertensive medications and fluid overload. Given her respiratory decompensation and extensive b/l crackles, ordered one dose of IV lasix 40 mg and Bipap. Also given one dose of IV labetalol 10 mg with improvement of her pressures (will avoid further use given her history of HF and low EF). Troponin's flattened. EKG showed NSR, no ST elevations or t wave inversions. Was successfully able to wean off her BiPAP and saturating well~ 100% on 6L  . Her blood pressure trended down nicely and her oral home ant-hypertensive's were restarted.      Hypokalemia- Improved  Elevated Cr - K 2.8, given 4 runs of KCl in ED  - BMP now to check K and see Cr trend, will give fluids if patient not keeping up PO intake.    STD- None

## 2020-07-24 LAB — CULTURE, BLOOD (ROUTINE X 2): Special Requests: ADEQUATE

## 2020-07-27 ENCOUNTER — Telehealth: Payer: Self-pay

## 2020-07-27 NOTE — Telephone Encounter (Signed)
Transition Care Management Follow-up Telephone Call  Date of discharge and from where: 07/22/2020, [facility]   How have you been since you were released from the hospital? She said she is feeling okay, better than when she was admitted to the hospital  Any questions or concerns? No  Items Reviewed:  Did the pt receive and understand the discharge instructions provided? Yes   Medications obtained and verified? Yes she said she has all medications and will call the pharmacy with her refill needs. She had no questions about her med regime at this time  Other? No   Any new allergies since your discharge? No   Do you have support at home? she said she does not live alone but does not have any help at home   Home Care and Equipment/Supplies: Were home health services ordered? no If so, what is the name of the agency? n/a Has the agency set up a time to come to the patient's home?  n/a Were any new equipment or medical supplies ordered?  No What is the name of the medical supply agency? n/a Were you able to get the supplies/equipment? n/a Do you have any questions related to the use of the equipment or supplies? No, n/a  She already has a glucometer and home BP monitor and has not checked her blood sugar or BP yet this morning.   Functional Questionnaire: (I = Independent and D = Dependent) ADLs: independent  Follow up appointments reviewed:   PCP Hospital f/u appt confirmed? Yes  - appointment with Dr Laural Benes 08/20/2020.  Offer her an appointment with Dr Alvis Lemmings earlier in January but she said she would keep what she has scheduled   Specialist Hospital f/u appt confirmed? none scheduled at this time.  needs appointment with cardiology.   Are transportation arrangements needed? No   If their condition worsens, is the pt aware to call PCP or go to the Emergency Dept.? yes  Was the patient provided with contact information for the PCP's office or ED?  She has the phone  number for [facility]  Was to pt encouraged to call back with questions or concerns?  Yes

## 2020-08-02 ENCOUNTER — Other Ambulatory Visit: Payer: Self-pay | Admitting: Cardiology

## 2020-08-02 MED FILL — CARVEDILOL 25 MG TABLET: 25 | 30 days supply | Qty: 60 | Fill #4

## 2020-08-02 MED FILL — hydrALAZINE HCL 50 MG TABS: 50 | 30 days supply | Qty: 90 | Fill #1

## 2020-08-02 MED FILL — GABAPENTIN 300 MG CAPSULE: 300 | 30 days supply | Qty: 30 | Fill #2

## 2020-08-02 MED FILL — $BRILINTA 60 MG TABLET: 60 | 90 days supply | Qty: 180 | Fill #0

## 2020-08-02 MED FILL — glipiZIDE 10 MG TABS: 10 | 30 days supply | Qty: 60 | Fill #4

## 2020-08-20 ENCOUNTER — Other Ambulatory Visit: Payer: Self-pay

## 2020-08-20 ENCOUNTER — Other Ambulatory Visit: Payer: Self-pay | Admitting: Internal Medicine

## 2020-08-20 ENCOUNTER — Ambulatory Visit: Payer: Self-pay | Attending: Internal Medicine | Admitting: Internal Medicine

## 2020-08-20 DIAGNOSIS — N898 Other specified noninflammatory disorders of vagina: Secondary | ICD-10-CM

## 2020-08-20 DIAGNOSIS — R300 Dysuria: Secondary | ICD-10-CM

## 2020-08-20 MED ORDER — DAPAGLIFLOZIN PROPANEDIOL 10 MG PO TABS: 10.0000 mg | ORAL_TABLET | Freq: Every day | ORAL | 0 refills | Status: DC

## 2020-08-20 MED ORDER — CEFUROXIME AXETIL 250 MG PO TABS
250.0000 mg | ORAL_TABLET | Freq: Two times a day (BID) | ORAL | 0 refills | Status: DC
Start: 2020-08-20 — End: 2020-08-20

## 2020-08-20 MED ORDER — SPIRONOLACTONE 25 MG PO TABS: 25.0000 mg | ORAL_TABLET | Freq: Two times a day (BID) | ORAL | 0 refills | Status: DC

## 2020-08-20 MED FILL — SPIRONOLACTONE 25 MG TABLET: 25 | 30 days supply | Qty: 60 | Fill #0

## 2020-08-20 MED FILL — CEFUROXIME AXETIL 250 MG TA: 250 | 7 days supply | Qty: 14 | Fill #0

## 2020-08-20 MED FILL — PANTOPRAZOLE SOD DR 40 MG T: 40 | 30 days supply | Qty: 30 | Fill #5

## 2020-08-20 MED FILL — $FARXIGA 10 MG TABLET: 10 | 90 days supply | Qty: 90 | Fill #0

## 2020-08-20 NOTE — Progress Notes (Signed)
Virtual Visit via Telephone Note  I connected with Amy Chaney on 08/20/20 at 10:13 a.m by telephone and verified that I am speaking with the correct person using two identifiers.  Location: Patient: home Provider: office  The patient, my CMA Ms. Lebron Quam and myself participated in this visit. I discussed the limitations, risks, security and privacy concerns of performing an evaluation and management service by telephone and the availability of in person appointments. I also discussed with the patient that there may be a patient responsible charge related to this service. The patient expressed understanding and agreed to proceed.  History of Present Illness: Patient with history of HTN, CAD, CHF, DM, HL, tobacco dependence, obesity. PCP is Dr. Margarita Rana. Today's visit is urgent care.  c/o dysuria x 1 wk No blood in urine or fever or back pain. Had urine culture done the end of December which grew E. coli that was resistant to Bactrim and penicillins. Also has white vaginal dischg x 2 wks.  No itching or discomfort. Sexually active with one partner whom she has been with for a while.  Request STI check.  Requesting refill on Farxiga and spironolactone.  Observations/Objective: No direct observation done as this was a telephone encounter.  Assessment and Plan: 1. Dysuria I looked at her recent urine culture.  E. coli was sensitive to Cipro.  However when I tried to prescribe the Cipro I got a flag that patient has QT prolongation.  Therefore antibiotic changed to Ceftin. - cefUROXime (CEFTIN) 250 MG tablet; Take 1 tablet (250 mg total) by mouth 2 (two) times daily with a meal.  Dispense: 14 tablet; Refill: 0  2. Vaginal discharge - Cervicovaginal ancillary only; Future   Follow Up Instructions: Follow-up with Dr. Margarita Rana 4 to 6 weeks for chronic disease management   I discussed the assessment and treatment plan with the patient. The patient was provided an opportunity to ask  questions and all were answered. The patient agreed with the plan and demonstrated an understanding of the instructions.   The patient was advised to call back or seek an in-person evaluation if the symptoms worsen or if the condition fails to improve as anticipated.  I provided  6 minutes of non-face-to-face time during this encounter.   Karle Plumber, MD

## 2020-08-20 NOTE — Progress Notes (Signed)
Pt states she has discomfort when she urinates

## 2020-08-23 ENCOUNTER — Other Ambulatory Visit: Payer: Self-pay

## 2020-08-23 DIAGNOSIS — N898 Other specified noninflammatory disorders of vagina: Secondary | ICD-10-CM

## 2020-08-25 ENCOUNTER — Other Ambulatory Visit: Payer: Self-pay | Admitting: Internal Medicine

## 2020-08-25 LAB — CERVICOVAGINAL ANCILLARY ONLY
Bacterial Vaginitis (gardnerella): POSITIVE — AB
Candida Glabrata: NEGATIVE
Candida Vaginitis: NEGATIVE
Chlamydia: NEGATIVE
Comment: NEGATIVE
Comment: NEGATIVE
Comment: NEGATIVE
Comment: NEGATIVE
Comment: NEGATIVE
Comment: NORMAL
Neisseria Gonorrhea: NEGATIVE
Trichomonas: POSITIVE — AB

## 2020-08-25 MED ORDER — METRONIDAZOLE 500 MG PO TABS
500.0000 mg | ORAL_TABLET | Freq: Two times a day (BID) | ORAL | 0 refills | Status: DC
Start: 2020-08-25 — End: 2020-08-25

## 2020-08-26 MED FILL — metroNIDAZOLE 500 MG TABS: 500 | 7 days supply | Qty: 14 | Fill #0

## 2020-08-27 MED FILL — TRUE METRIX GLUCOSE TEST ST: 25 days supply | Qty: 100 | Fill #3

## 2020-09-17 MED FILL — GABAPENTIN 300 MG CAPSULE: 300 | 30 days supply | Qty: 30 | Fill #3

## 2020-09-17 MED FILL — hydrALAZINE HCL 50 MG TABS: 50 | 30 days supply | Qty: 90 | Fill #2

## 2020-09-23 MED FILL — ATORVASTATIN CALCIUM 80 MG: 80 | 30 days supply | Qty: 30 | Fill #5

## 2020-09-23 MED FILL — SPIRONOLACTONE 25 MG TABLET: 25 | 30 days supply | Qty: 60 | Fill #7

## 2020-10-07 LAB — CBC
Hematocrit: 39.3 % (ref 34.0–46.6)
Hemoglobin: 12.7 g/dL (ref 11.1–15.9)
MCH: 26.4 pg — ABNORMAL LOW (ref 26.6–33.0)
MCHC: 32.3 g/dL (ref 31.5–35.7)
MCV: 82 fL (ref 79–97)
Platelets: 409 10*3/uL (ref 150–450)
RBC: 4.81 x10E6/uL (ref 3.77–5.28)
RDW: 13.4 % (ref 11.7–15.4)
WBC: 11.4 10*3/uL — ABNORMAL HIGH (ref 3.4–10.8)

## 2020-10-07 LAB — COMPREHENSIVE METABOLIC PANEL
ALT: 12 IU/L (ref 0–32)
AST: 17 IU/L (ref 0–40)
Albumin/Globulin Ratio: 1.2 (ref 1.2–2.2)
Albumin: 4.3 g/dL (ref 3.8–4.8)
Alkaline Phosphatase: 58 IU/L (ref 44–121)
BUN/Creatinine Ratio: 11 (ref 9–23)
BUN: 10 mg/dL (ref 6–24)
Bilirubin Total: 0.6 mg/dL (ref 0.0–1.2)
CO2: 22 mmol/L (ref 20–29)
Calcium: 9.9 mg/dL (ref 8.7–10.2)
Chloride: 103 mmol/L (ref 96–106)
Creatinine, Ser: 0.94 mg/dL (ref 0.57–1.00)
Globulin, Total: 3.7 g/dL (ref 1.5–4.5)
Glucose: 219 mg/dL — ABNORMAL HIGH (ref 65–99)
Potassium: 3.7 mmol/L (ref 3.5–5.2)
Sodium: 142 mmol/L (ref 134–144)
Total Protein: 8 g/dL (ref 6.0–8.5)
eGFR: 74 mL/min/{1.73_m2} (ref >59)

## 2020-10-07 LAB — LIPID PANEL
Chol/HDL Ratio: 4.2 ratio (ref 0.0–4.4)
Cholesterol, Total: 171 mg/dL (ref 100–199)
HDL: 41 mg/dL (ref >39)
LDL Chol Calc (NIH): 111 mg/dL — ABNORMAL HIGH (ref 0–99)
Triglycerides: 103 mg/dL (ref 0–149)
VLDL Cholesterol Cal: 19 mg/dL (ref 5–40)

## 2020-10-15 ENCOUNTER — Other Ambulatory Visit: Payer: Self-pay

## 2020-10-15 ENCOUNTER — Encounter: Payer: Self-pay | Admitting: Physician Assistant

## 2020-10-15 ENCOUNTER — Ambulatory Visit (INDEPENDENT_AMBULATORY_CARE_PROVIDER_SITE_OTHER): Payer: Self-pay | Admitting: Physician Assistant

## 2020-10-15 ENCOUNTER — Other Ambulatory Visit: Payer: Self-pay | Admitting: Physician Assistant

## 2020-10-15 VITALS — BP 154/62 | HR 84 | Ht 69.0 in | Wt 212.8 lb

## 2020-10-15 DIAGNOSIS — E119 Type 2 diabetes mellitus without complications: Secondary | ICD-10-CM

## 2020-10-15 DIAGNOSIS — I255 Ischemic cardiomyopathy: Secondary | ICD-10-CM

## 2020-10-15 DIAGNOSIS — I2510 Atherosclerotic heart disease of native coronary artery without angina pectoris: Secondary | ICD-10-CM

## 2020-10-15 DIAGNOSIS — I10 Essential (primary) hypertension: Secondary | ICD-10-CM

## 2020-10-15 DIAGNOSIS — I5042 Chronic combined systolic (congestive) and diastolic (congestive) heart failure: Secondary | ICD-10-CM

## 2020-10-15 DIAGNOSIS — E785 Hyperlipidemia, unspecified: Secondary | ICD-10-CM

## 2020-10-15 MED ORDER — PANTOPRAZOLE SODIUM 40 MG PO TBEC: 40.0000 mg | DELAYED_RELEASE_TABLET | Freq: Every day | ORAL | 3 refills | Status: DC

## 2020-10-15 MED ORDER — SPIRONOLACTONE 25 MG PO TABS: 25.0000 mg | ORAL_TABLET | Freq: Two times a day (BID) | ORAL | 3 refills | Status: DC

## 2020-10-15 NOTE — Progress Notes (Signed)
Cardiology Office Note:    Date:  10/17/2020   ID:  Amy Chaney, DOB Nov 10, 1970, MRN [id number]  PCP:  [name], [place]  Cardiologist:  Glenetta Hew, MD  Advanced Practice Provider:  No care team member to display Electrophysiologist:  None   Referring MD: [name], MD   Chief Complaint  Patient presents with  . Follow-up    Seen for Dr. Ellyn Hack    History of Present Illness:    Amy Chaney is a 50 y.o. female with a hx of CAD, ischemic cardiomyopathy, chronic combined systolic and diastolic heart failure, hyperlipidemia, hypertension, DM2 and chronic tobacco abuse.  Patient had abnormal Myoview in July 2014 and subsequently underwent DES PCI of proximal to mid RCA with 2 overlapping drug-eluting stents by Dr. Einar Gip.  She had another NSTEMI in September 2014 after stopping Effient for 4 days, this resulted in 100% subacute stent thrombosis of RCA stent, this was treated with distal overlapping drug-eluting stent.  She had anterior STEMI in March 2019 involve the bifurcation of D2 and LAD that was treated with drug-eluting stent.  She unfortunately had another an anterior STEMI in December 2019 with subacute stent thrombosis treated with DES to distal LAD.  EF was 35 to 40% the time.  Patient was admitted for flash pulmonary edema in May 2020, ejection fraction was 20 to 25% with signs of Takotsubo cardiomyopathy.  This occurred in the setting of her son's death.  She was started on carvedilol, Entresto and spironolactone.  Repeat echocardiogram in July 2020 shows EF has improved to 45 to 50%, moderate LVH, grade 1 DD, severe apical akinesis.  Patient was admitted again in April 2021 due to flash pulmonary edema and acute respiratory failure.  EF was down to 20 to 25% again on echocardiogram.  Left and right heart catheterization performed on 11/04/2019 showed 100% occluded ostial D2, patent stent in the LAD, 50% OM 2 lesion, 40% mid to  distal RCA lesion.  Coronary anatomy was relatively stable, it was suspected that her reduced ejection fraction was due to hypertensive urgency.  Since the last visit, patient was admitted in December 2021 due to bilateral pyelonephritis and E. coli bacteremia.  She was discharged on antibiotic.  Patient presents today for follow-up.  She denies any chest pain or worsening shortness of breath.  She is doing well on the current therapy.  Unfortunately blood pressure remains elevated.  Although hydralazine is listed as 50 mg twice daily dosing, she is actually taking hydralazine as 50 mg 3 times daily dosing.  I will increase hydralazine further to 100 mg 3 times daily.  Overall, I think she is doing quite well from a cardiac perspective.  She can follow-up in 9-monthwith Dr. HEllyn Hack Past Medical History:  Diagnosis Date  . Coronary artery disease involving native coronary artery of native heart with angina pectoris ([place] 01/2013   a. s/p PCI of the mid RCA 01/2013 //  b. LHC 9/14: EF 55%, RCA stent 100% occluded, LAD irregularities >>  subacute stent thrombosis, Promus DES PCI distal overlap // c) 09/2017 - Ant STEMI - LAD-D2 PCI - Successful PCI of LAD (3 overlapping DES), unable to restore flow down D2l that was stented as well.  Likely related to downstream dissection, but unable to rewire.  . Daily headache   . Depression   . Dyslipidemia, goal LDL below 70   . History of Doppler ultrasound  carotid bruit >> a. Carotid US 6/17: bilat ICA 1-39%  . History of Takotsubo cardiomyopathy 11/2018   Admitted for acute on chronic combined systolic and diastolic heart failure.  EF down to 20-25% global hypokinesis.  Was in the setting of grieving over the loss of her son from MI.  ->  EF improved to 45-50% on follow-up echocardiogram.  . Hypertension   . Ischemic cardiomyopathy 06/2018   s/p inferior STEMI followed by 2 anterior STEMIs: Following recovery from Takotsubo Cardiomyopathy- Echo 02/14/19:  Moderate LVH.  EF improved up to 45-50%.  GR 1 DD.  Severe apical akinesis.  Normal valves.  . Mild dysplasia of cervix (CIN I) 02/04/2019   Seen after colposcopy on 01/23/19 done for ASCU +HPV pap  > repeat pap and HPV test in 12 and 24 months  . STEMI involving left anterior descending coronary artery ([place]) 09/2017; 06/2018   a) Severe [place] 1, 1, 1 LAD-D2 lesion (complicated by post PTCA dissection/intramural hematoma) -successful extensive PCI of the LAD but unable to maintain patency of the stented D2. b) distal mLAD stent Edge 100% thrombosis --> PTCA & Overlapping DES PCI (Synergy 3 x 12 --> 3.6-3.3 mm). D2 occluded. RCA stents patent, 65 % OM2. EF 30-35%.  Marland Kitchen STEMI involving oth coronary artery of inferior wall ([place]) 03/2013   Secondary to subacute stent thrombosis of RCA stent segment having missed 4 doses of Effient.  . Tobacco abuse 01/11/2016  . Type II diabetes mellitus ([place]) 12/2010   sister and son also diabetic     Past Surgical History:  Procedure Laterality Date  . CHOLECYSTECTOMY  ~ 2000  . CORONARY STENT INTERVENTION N/A 10/10/2017   Procedure: CORONARY STENT INTERVENTION;  Surgeon: Leonie Man, MD;  Location: [facility];  Service: Cardiovascular; LAD PCI: STENT SYNERGY DES 3.5X32 (p),STENT SYNERGY DES 3.5X 8 (m), STENT SYNERGY DES 3.5X28 (d).  D2 PCI: STENT SYNERGY DES 2.5X24.  (UNABL TO REWIRE & EXPAND - TO OF DIAG AT END OF CASE)  . CORONARY STENT INTERVENTION N/A 07/03/2018   Procedure: CORONARY STENT INTERVENTION;  Surgeon: Leonie Man, MD;  Location: [facility];; PTCA & overlapping DES PCI (overlaps prior stents distally) - Synergy DES 3 x 12 (3.6 mm @ overlap -- 3.3 mm distally)>  . LEFT HEART CATH AND CORONARY ANGIOGRAPHY N/A 10/10/2017   Procedure: LEFT HEART CATH AND CORONARY ANGIOGRAPHY;  Surgeon: Leonie Man, MD;  Location: [facility];  Service: Cardiovascular;  Laterality: N/A; -patent RCA stents with mild in-stent restenosis. [place]  1,1,1 mLD-D2 02% (complicated by dissection/intramural thrombus)  . LEFT HEART CATH AND CORONARY ANGIOGRAPHY N/A 07/03/2018   Procedure: LEFT HEART CATH AND CORONARY ANGIOGRAPHY;  Surgeon: Leonie Man, MD;  Location: [facility];; 100% thrombotic occlusion distal LAD stent -- DES PCI. continued 100% D2 (previously lost). patent RCA stents, 65% OM2.  EF 30-35%.  Marland Kitchen LEFT HEART CATHETERIZATION WITH CORONARY ANGIOGRAM N/A 02/25/2013   Procedure: LEFT HEART CATHETERIZATION WITH CORONARY ANGIOGRAM;  Surgeon: Laverda Page, MD;  Location: [facility] CATH LAB;  Service: Cardiovascular;; CTO m RCA; otherwise normal coronaries  . LEFT HEART CATHETERIZATION WITH CORONARY ANGIOGRAM N/A 04/01/2013   Procedure: LEFT HEART CATHETERIZATION WITH CORONARY ANGIOGRAM;  Surgeon: Laverda Page, MD;  Location: [facility] CATH LAB;  Service: Cardiovascular: 100% occlusion of distal RCA stent (subacute stent thrombosis -  secondary to stopping Effient).  Overlapping DES PCI  . NM MYOVIEW LTD  12/2015  EF 38%.  Hypertensive response to exercise (231/132 mmHg).  INTERMEDIATE RISK due to -diffuse hypokinesis and reduced EF.  No ischemia or infarction.  Marland Kitchen PERCUTANEOUS CORONARY STENT INTERVENTION (PCI-S)  04/01/2013   Procedure: PERCUTANEOUS CORONARY STENT INTERVENTION (PCI-S);  Surgeon: Laverda Page, MD;  Location: [facility] CATH LAB;  Service: Cardiovascular;; PCI of distal RCA stent subacute thrombosis: Promus Premier DES 3.0 mm x 20 mm.  Marland Kitchen PERCUTANEOUS CORONARY STENT INTERVENTION (PCI-S)  02/25/2013   Procedure: PERCUTANEOUS CORONARY STENT INTERVENTION (PCI-S);  Surgeon: Laverda Page, MD;  Location: [facility] CATH LAB;  RCA CTO PCI with overlapping Promus DES: 3.0 mm x 38 mm, 3.5 mm x 36m  . RIGHT/LEFT HEART CATH AND CORONARY ANGIOGRAPHY N/A 11/04/2019   Procedure: RIGHT/LEFT HEART CATH AND CORONARY ANGIOGRAPHY;  Surgeon: HLeonie Man MD;  [facility];; Mildly elevated LVEDP.  Ostial D1 100%.  Proximal to mid  LAD stent 10% ISR.  Mid and distal stent widely patent.  30% distal distance.  Ostial OM 2 55%.  Proximal to mid RCA stent 50% stenosis.  Mid to distal RCA 40%.  Suspect that reduced EF is related to hypertensive emergen  . TRANSTHORACIC ECHOCARDIOGRAM  07/04/2018   recurrent Anterior STEMI: EF 35-40%.  Apical hypokinesis.  GRII DD.  No thrombus noted.  (Improved EF from 30% up to 35-40% from previous echo)  . TRANSTHORACIC ECHOCARDIOGRAM  11/2018; 01/2019   a) TAKOTUSBO CM: EF 20-25%-diffuse HK..  Mod V thickness.  GRII DD w/ high LVEDP.  Severe LA dilation;; b) 02/14/19: Moderate LVH.  EF improved up to 45-50%.  GR 1 DD.  Severe apical akinesis.  Normal valves.  . TRANSTHORACIC ECHOCARDIOGRAM  10/31/2019   Hypertensive emergency, hypoxic/hypercapnic respiratory failure;  EF 20 to 25%.  Global HK.  Mildly dilated LV.  Mildly elevated PA pressures.  Mild LA dilation.  Mild AR.  Mildly elevated RAP-8 mmHg.=> Eventual cath showed no change  . TUBAL LIGATION  1994    Current Medications: Current Meds  Medication Sig  . atorvastatin (LIPITOR) 80 MG tablet Take 1 tablet (80 mg total) by mouth daily.  . blood glucose meter kit and supplies Dispense based on patient and insurance preference. Use up to four times daily as directed. (FOR ICD-10 E10.9, E11.9).  .Marland KitchenBlood Glucose Monitoring Suppl (TRUE METRIX METER) DEVI 1 kit by Does not apply route 4 (four) times daily.  .Marland KitchenBRILINTA 60 MG TABS tablet TAKE 1 TABLET (60 MG TOTAL) BY MOUTH 2 (TWO) TIMES DAILY.  . carvedilol (COREG) 25 MG tablet Take 1 tablet (25 mg total) by mouth 2 (two) times daily.  . cefUROXime (CEFTIN) 250 MG tablet Take 1 tablet (250 mg total) by mouth 2 (two) times daily with a meal.  . furosemide (LASIX) 20 MG tablet Take 218mtablet by mouth daily and may take an additional 2072m51m20m weight is more than 3 to 5lbs. (Patient taking differently: Take 20 mg by mouth 2 (two) times daily.)  . gabapentin (NEURONTIN) 300 MG capsule Take 1  capsule (300 mg total) by mouth at bedtime.  . glMarland KitchenpiZIDE (GLUCOTROL) 10 MG tablet Take 1 tablet (10 mg total) by mouth 2 (two) times daily before a meal.  . glucose blood test strip Use as instructed  . hydrALAZINE (APRESOLINE) 50 MG tablet Take 50mg87mce a day.  Pt may take an additional tablet (50mg)31mpt's blood pressure is greater than [id number] (top number). (Patient taking differently: Take 50 mg by  mouth 3 (three) times daily.)  . insulin glargine (LANTUS SOLOSTAR) 100 UNIT/ML Solostar Pen Inject 38 Units into the skin daily.  . metroNIDAZOLE (FLAGYL) 500 MG tablet Take 1 tablet (500 mg total) by mouth 2 (two) times daily.  . naproxen sodium (ALEVE) 220 MG tablet Take 220 mg by mouth daily as needed (pain).  . nicotine (NICODERM CQ - DOSED IN MG/24 HOURS) 14 mg/24hr patch Place 1 patch (14 mg total) onto the skin daily.  . nitroGLYCERIN (NITROSTAT) 0.4 MG SL tablet Place 1 tablet (0.4 mg total) under the tongue every 5 (five) minutes as needed for chest pain. Reported on 11/25/2015 (Patient taking differently: Place 0.4 mg under the tongue every 5 (five) minutes as needed for chest pain (max 3 doses).)  . sacubitril-valsartan (ENTRESTO) 97-103 MG Take 1 tablet by mouth 2 (two) times daily.  . TRUEPLUS LANCETS 28G MISC 28 g by Does not apply route 4 (four) times daily.  . [DISCONTINUED] dapagliflozin propanediol (FARXIGA) 10 MG TABS tablet Take 1 tablet (10 mg total) by mouth daily before breakfast.  . [DISCONTINUED] pantoprazole (PROTONIX) 40 MG tablet Take 1 tablet (40 mg total) by mouth daily.  . [DISCONTINUED] spironolactone (ALDACTONE) 25 MG tablet Take 1 tablet (25 mg total) by mouth 2 (two) times daily.     Allergies:   Wilder Glade [dapagliflozin]   Social History   Socioeconomic History  . Marital status: Single    Spouse name: Not on file  . Number of children: 3  . Years of education: Not on file  . Highest education level: 11th grade  Occupational History    Employer:  [employer]  Tobacco Use  . Smoking status: Current Every Day Smoker    Packs/day: 0.00    Years: 22.00    Pack years: 0.00    Types: Cigarettes  . Smokeless tobacco: Never Used  . Tobacco comment: pt states she smokes about 1-2 cigarettes per day--08/01/18  Vaping Use  . Vaping Use: Never used  Substance and Sexual Activity  . Alcohol use: Yes    Alcohol/week: 0.0 standard drinks    Comment: 07/04/2018 "only on special occasions; maybe once/yr"  . Drug use: Yes    Types: Marijuana    Comment: 02/25/2013 "quit marijuana ~ 2001"  . Sexual activity: Not Currently    Birth control/protection: Surgical  Other Topics Concern  . Not on file  Social History Narrative  . Not on file   Social Determinants of Health   Financial Resource Strain: Not on file  Food Insecurity: Not on file  Transportation Needs: Not on file  Physical Activity: Not on file  Stress: Not on file  Social Connections: Not on file     Family History: The patient's family history includes Diabetes in her mother, sister, sister, and son.  ROS:   Please see the history of present illness.     All other systems reviewed and are negative.  EKGs/Labs/Other Studies Reviewed:    The following studies were reviewed today:  Echo 10/31/2019 1. Severe global reduction in LV systolic function; mild LVH; mild LVE;  restrictive filling; mild MR and TR; mild LAE; moderate pulmonary  hypertension.  2. Left ventricular ejection fraction, by estimation, is 20 to 25%. The  left ventricle has severely decreased function. The left ventricle  demonstrates global hypokinesis. The left ventricular internal cavity size  was mildly dilated. There is mild left  ventricular hypertrophy. Left ventricular diastolic parameters are  consistent with Grade III diastolic dysfunction (  restrictive).  3. Right ventricular systolic function is normal. The right ventricular  size is normal. There is moderately elevated pulmonary artery  systolic  pressure.  4. Left atrial size was mildly dilated.  5. The mitral valve is normal in structure. Mild mitral valve  regurgitation. No evidence of mitral stenosis.  6. The aortic valve is tricuspid. Aortic valve regurgitation is not  visualized. No aortic stenosis is present.  7. The inferior vena cava is normal in size with <50% respiratory  variability, suggesting right atrial pressure of 8 mmHg.   EKG:  EKG is not ordered today.   Recent Labs: 10/31/2019: TSH 1.904 02/15/2020: B Natriuretic Peptide 635.5 07/22/2020: Magnesium 2.2 10/07/2020: ALT 12; BUN 10; Creatinine, Ser 0.94; Hemoglobin 12.7; Platelets 409; Potassium 3.7; Sodium 142  Recent Lipid Panel    Component Value Date/Time   CHOL 171 10/07/2020 1043   TRIG 103 10/07/2020 1043   HDL 41 10/07/2020 1043   CHOLHDL 4.2 10/07/2020 1043   CHOLHDL 4.2 07/05/2018 0330   VLDL 15 07/05/2018 0330   LDLCALC 111 (H) 10/07/2020 1043     Risk Assessment/Calculations:       Physical Exam:    VS:  BP (!) 154/62   Pulse 84   Ht _0  (1.753 m)   Wt 212 lb 12.8 oz (96.5 kg)   LMP 10/20/2015   SpO2 98%   BMI 31.43 kg/m     Wt Readings from Last 3 Encounters:  10/15/20 212 lb 12.8 oz (96.5 kg)  07/22/20 220 lb 7.4 oz (100 kg)  06/01/20 231 lb 6.4 oz (105 kg)     GEN:  Well nourished, well developed in no acute distress HEENT: Normal NECK: No JVD; No carotid bruits LYMPHATICS: No lymphadenopathy CARDIAC: RRR, no murmurs, rubs, gallops RESPIRATORY:  Clear to auscultation without rales, wheezing or rhonchi  ABDOMEN: Soft, non-tender, non-distended MUSCULOSKELETAL:  No edema; No deformity  SKIN: Warm and dry NEUROLOGIC:  Alert and oriented x 3 PSYCHIATRIC:  Normal affect   ASSESSMENT:    1. Coronary artery disease involving native coronary artery of native heart without angina pectoris   2. Ischemic cardiomyopathy   3. Chronic combined systolic (congestive) and diastolic (congestive) heart failure ([place])    4. Primary hypertension   5. Hyperlipidemia LDL goal <70   6. Controlled type 2 diabetes mellitus without complication, without long-term current use of insulin (HCC)    PLAN:    In order of problems listed above:  1. CAD: Denies any recent chest pain.  Continue Brilinta 60 mg twice daily and Lipitor  2. Ischemic cardiomyopathy on carvedilol, Entresto and spironolactone  3. Chronic combined systolic and diastolic heart failure: Euvolemic on exam  4. Hypertension: Blood pressure is elevated today, will increase hydralazine to 100 mg 3 times daily dosing  5. Hyperlipidemia: On Lipitor  6. DM2: Managed by primary care provider.        Medication Adjustments/Labs and Tests Ordered: Current medicines are reviewed at length with the patient today.  Concerns regarding medicines are outlined above.  No orders of the defined types were placed in this encounter.  Meds ordered this encounter  Medications  . pantoprazole (PROTONIX) 40 MG tablet    Sig: Take 1 tablet (40 mg total) by mouth daily.    Dispense:  90 tablet    Refill:  3  . spironolactone (ALDACTONE) 25 MG tablet    Sig: Take 1 tablet (25 mg total) by mouth 2 (two) times daily.  Dispense:  180 tablet    Refill:  3    Patient Instructions  Medication Instructions:  Your physician recommends that you continue on your current medications as directed. Please refer to the Current Medication list given to you today.  *If you need a refill on your cardiac medications before your next appointment, please call your pharmacy*  Lab Work: NONE ordered at this time of appointment   If you have labs (blood work) drawn today and your tests are completely normal, you will receive your results only by: Marland Kitchen MyChart Message (if you have MyChart) OR . A paper copy in the mail If you have any lab test that is abnormal or we need to change your treatment, we will call you to review the results.  Testing/Procedures: NONE ordered at  this time of appointment   Follow-Up: At [facility], you and your health needs are our priority.  As part of our continuing mission to provide you with exceptional heart care, we have created designated Provider Care Teams.  These Care Teams include your primary Cardiologist (physician) and Advanced Practice Providers (APPs -  Physician Assistants and Nurse Practitioners) who all work together to provide you with the care you need, when you need it.  We recommend signing up for the patient portal called "MyChart".  Sign up information is provided on this After Visit Summary.  MyChart is used to connect with patients for Virtual Visits (Telemedicine).  Patients are able to view lab/test results, encounter notes, upcoming appointments, etc.  Non-urgent messages can be sent to your provider as well.   To learn more about what you can do with MyChart, go to [email or web address].    Your next appointment:   4 month(s)  The format for your next appointment:   In Person  Provider:   Glenetta Hew, MD  Other Instructions      Signed, Almyra Deforest, [place]  10/17/2020 11:51 PM    [place]

## 2020-10-15 NOTE — Patient Instructions (Signed)
Medication Instructions:  Your physician recommends that you continue on your current medications as directed. Please refer to the Current Medication list given to you today.  *If you need a refill on your cardiac medications before your next appointment, please call your pharmacy*  Lab Work: NONE ordered at this time of appointment   If you have labs (blood work) drawn today and your tests are completely normal, you will receive your results only by: MyChart Message (if you have MyChart) OR A paper copy in the mail If you have any lab test that is abnormal or we need to change your treatment, we will call you to review the results.  Testing/Procedures: NONE ordered at this time of appointment   Follow-Up: At [facility], you and your health needs are our priority.  As part of our continuing mission to provide you with exceptional heart care, we have created designated Provider Care Teams.  These Care Teams include your primary Cardiologist (physician) and Advanced Practice Providers (APPs -  Physician Assistants and Nurse Practitioners) who all work together to provide you with the care you need, when you need it.  We recommend signing up for the patient portal called "MyChart".  Sign up information is provided on this After Visit Summary.  MyChart is used to connect with patients for Virtual Visits (Telemedicine).  Patients are able to view lab/test results, encounter notes, upcoming appointments, etc.  Non-urgent messages can be sent to your provider as well.   To learn more about what you can do with MyChart, go to [email or web address].    Your next appointment:   4 month(s)  The format for your next appointment:   In Person  Provider:   David Harding, MD  Other Instructions   

## 2020-10-17 ENCOUNTER — Encounter: Payer: Self-pay | Admitting: Physician Assistant

## 2020-10-19 ENCOUNTER — Encounter: Payer: Self-pay | Admitting: Family Medicine

## 2020-10-19 ENCOUNTER — Other Ambulatory Visit: Payer: Self-pay | Admitting: Family Medicine

## 2020-10-19 ENCOUNTER — Other Ambulatory Visit: Payer: Self-pay

## 2020-10-19 ENCOUNTER — Telehealth: Payer: Self-pay

## 2020-10-19 ENCOUNTER — Ambulatory Visit: Payer: Self-pay | Attending: Family Medicine | Admitting: Family Medicine

## 2020-10-19 VITALS — BP 188/80 | HR 65 | Ht 69.0 in | Wt 213.4 lb

## 2020-10-19 DIAGNOSIS — E1149 Type 2 diabetes mellitus with other diabetic neurological complication: Secondary | ICD-10-CM

## 2020-10-19 DIAGNOSIS — I5042 Chronic combined systolic (congestive) and diastolic (congestive) heart failure: Secondary | ICD-10-CM

## 2020-10-19 DIAGNOSIS — E1165 Type 2 diabetes mellitus with hyperglycemia: Secondary | ICD-10-CM

## 2020-10-19 DIAGNOSIS — I110 Hypertensive heart disease with heart failure: Secondary | ICD-10-CM

## 2020-10-19 DIAGNOSIS — Z1211 Encounter for screening for malignant neoplasm of colon: Secondary | ICD-10-CM

## 2020-10-19 DIAGNOSIS — K3184 Gastroparesis: Secondary | ICD-10-CM

## 2020-10-19 DIAGNOSIS — I25110 Atherosclerotic heart disease of native coronary artery with unstable angina pectoris: Secondary | ICD-10-CM

## 2020-10-19 DIAGNOSIS — E1143 Type 2 diabetes mellitus with diabetic autonomic (poly)neuropathy: Secondary | ICD-10-CM

## 2020-10-19 LAB — POCT GLYCOSYLATED HEMOGLOBIN (HGB A1C): HbA1c, POC (controlled diabetic range): 8.2 % — AB (ref 0.0–7.0)

## 2020-10-19 LAB — GLUCOSE, POCT (MANUAL RESULT ENTRY): POC Glucose: 217 mg/dl — AB (ref 70–99)

## 2020-10-19 MED ORDER — GLIPIZIDE 10 MG PO TABS
10.0000 mg | ORAL_TABLET | Freq: Two times a day (BID) | ORAL | 1 refills | Status: DC
Start: 2020-10-19 — End: 2020-10-19

## 2020-10-19 MED ORDER — GABAPENTIN 300 MG PO CAPS: 300.0000 mg | ORAL_CAPSULE | Freq: Every day | ORAL | 1 refills | Status: DC

## 2020-10-19 MED ORDER — HYDRALAZINE HCL 100 MG PO TABS: 100.0000 mg | ORAL_TABLET | Freq: Three times a day (TID) | ORAL | 3 refills | Status: DC

## 2020-10-19 MED ORDER — LANTUS SOLOSTAR 100 UNIT/ML ~~LOC~~ SOPN
42.0000 [IU] | PEN_INJECTOR | Freq: Every day | SUBCUTANEOUS | 6 refills | Status: DC
Start: 2020-10-19 — End: 2020-10-19

## 2020-10-19 MED ORDER — ATORVASTATIN CALCIUM 80 MG PO TABS: 80.0000 mg | ORAL_TABLET | Freq: Every day | ORAL | 1 refills | Status: DC

## 2020-10-19 MED FILL — hydrALAZINE HCL 100 MG TABS: 100 | 30 days supply | Qty: 90 | Fill #0

## 2020-10-19 MED FILL — glipiZIDE 10 MG TABS: 10 | 30 days supply | Qty: 60 | Fill #0

## 2020-10-19 MED FILL — ATORVASTATIN CALCIUM 80 MG: 80 | 30 days supply | Qty: 30 | Fill #0

## 2020-10-19 MED FILL — !LANTUS SOLOSTAR 100UNITS/M: 100 | 28 days supply | Qty: 12 | Fill #0

## 2020-10-19 MED FILL — GABAPENTIN 300 MG CAPSULE: 300 | 30 days supply | Qty: 30 | Fill #0

## 2020-10-19 NOTE — Patient Instructions (Signed)
Gastroparesis  Gastroparesis is a condition in which food takes longer than normal to empty from the stomach. This condition is also known as delayed gastric emptying. It is usually a long-term (chronic) condition. There is no cure, but there are treatments and things that you can do at home to help relieve symptoms. Treating the underlying condition that causes gastroparesis can also help relieve symptoms. What are the causes? In many cases, the cause of this condition is not known. Possible causes include:  A hormone (endocrine) disorder, such as hypothyroidism or diabetes.  A nervous system disease, such as Parkinson's disease or multiple sclerosis.  Cancer, infection, or surgery that affects the stomach or vagus nerve. The vagus nerve runs from your chest, through your neck, and to the lower part of your brain.  A connective tissue disorder, such as scleroderma.  Certain medicines. What increases the risk? You are more likely to develop this condition if:  You have certain disorders or diseases. These may include: ? An endocrine disorder. ? An eating disorder. ? Amyloidosis. ? Scleroderma. ? Parkinson's disease. ? Multiple sclerosis. ? Cancer or infection of the stomach or the vagus nerve.  You have had surgery on your stomach or vagus nerve.  You take certain medicines.  You are female. What are the signs or symptoms? Symptoms of this condition include:  Feeling full after eating very little or a loss of appetite.  Nausea, vomiting, or heartburn.  Bloating of your abdomen.  Inconsistent blood sugar (glucose) levels on blood tests.  Unexplained weight loss.  Acid from the stomach coming up into the esophagus (gastroesophageal reflux).  Sudden tightening (spasm) of the stomach, which can be painful. Symptoms may come and go. Some people may not notice any symptoms. How is this diagnosed? This condition is diagnosed with tests, such as:  Tests that check how  long it takes food to move through the stomach and intestines. These tests include: ? Upper gastrointestinal (GI) series. For this test, you drink a liquid that shows up well on X-rays, and then X-rays are taken of your intestines. ? Gastric emptying scintigraphy. For this test, you eat food that contains a small amount of radioactive material, and then scans are taken. ? Wireless capsule GI monitoring system. For this test, you swallow a pill (capsule) that records information about how foods and fluid move through your stomach.  Gastric manometry. For this test, a tube is passed down your throat and into your stomach to measure electrical and muscular activity.  Endoscopy. For this test, a long, thin tube with a camera and light on the end is passed down your throat and into your stomach to check for problems in your stomach lining.  Ultrasound. This test uses sound waves to create images of the inside of your body. This can help rule out gallbladder disease or pancreatitis as a cause of your symptoms. How is this treated? There is no cure for this condition, but treatment and home care may relieve symptoms. Treatment may include:  Treating the underlying cause.  Managing your symptoms by making changes to your diet and exercise habits.  Taking medicines to control nausea and vomiting and to stimulate stomach muscles.  Getting food through a feeding tube in the hospital. This may be done in severe cases.  Having surgery to insert a device called a gastric electrical stimulator into your body. This device helps improve stomach emptying and control nausea and vomiting. Follow these instructions at home:  Take over-the-counter and   prescription medicines only as told by your health care provider.  Follow instructions from your health care provider about eating or drinking restrictions. Your health care provider may recommend that you: ? Eat smaller meals more often. ? Eat low-fat  foods. ? Eat low-fiber forms of high-fiber foods. For example, eat cooked vegetables instead of raw vegetables. ? Have only liquid foods instead of solid foods. Liquid foods are easier to digest.  Drink enough fluid to keep your urine pale yellow.  Exercise as often as told by your health care provider.  Keep all follow-up visits. This is important. Contact a health care provider if you:  Notice that your symptoms do not improve with treatment.  Have new symptoms. Get help right away if you:  Have severe pain in your abdomen that does not improve with treatment.  Have nausea that is severe or does not go away.  Vomit every time you drink fluids. Summary  Gastroparesis is a long-term (chronic) condition in which food takes longer than normal to empty from the stomach.  Symptoms include nausea, vomiting, heartburn, bloating of your abdomen, and loss of appetite.  Eating smaller portions, low-fat foods, and low-fiber forms of high-fiber foods may help you manage your symptoms.  Get help right away if you have severe pain in your abdomen. This information is not intended to replace advice given to you by your health care provider. Make sure you discuss any questions you have with your health care provider. Document Revised: 11/24/2019 Document Reviewed: 11/24/2019 Elsevier Patient Education  2021 Elsevier Inc.  

## 2020-10-19 NOTE — Progress Notes (Signed)
Pt states having headache and chest pain.  No breakfast took morning medications.

## 2020-10-19 NOTE — Progress Notes (Signed)
Subjective:  Patient ID: Amy Chaney, female    DOB: September 16, 1970  Age: 50 y.o. MRN: [id number]  CC: Diabetes   HPI Amy Chaney is a 49 year old female with a history of type 2 diabetes mellitus (A1c8.2), hypertension, CHF (EF20-25%from 4/2021which isdown from 45-50% 01/2019 ), CAD (status post PCI to LAD with 3 overlapping DES in 09/2017), STEMI in 06/2018.  She has had a headache since she woke up this morning When she lays down at night she feels like her heart will come out of her chest.  States she informed her cardiology PA of this at her last visit and Protonix prescription was written for her.  She does have a means of checking her blood pressure at home which has been elevated and she states her heart rate has been around 77. Her blood pressure is elevated today at 188/80 and hydralazine has been increased from 50 mg 3 times daily to 100 mg 3 times daily at her cardiology visit 4 days ago however this prescription was not sent to the pharmacy and she was unable to pick it up.  She has been taking her previous dose of 50 mg 3 times daily at home. Denies presence of chest pain or worsening dyspnea.  Her A1c is 8.2 which is up from 7.83 months ago. Sugars are 135-140 and she endorses compliance with her insulin. Previously was on [place] but states she had Pyelonephritis with it and ended up in the hospital in 06/2020 so she would rather not take it. Neuropathy is controlled on gabapentin she has no visual concerns.  She complains of early satiety; she is on Protonix for GERD. Not up-to-date on colon cancer screening.  Past Medical History:  Diagnosis Date  . Coronary artery disease involving native coronary artery of native heart with angina pectoris ([place]) 01/2013   a. s/p PCI of the mid RCA 01/2013 //  b. LHC 9/14: EF 55%, RCA stent 100% occluded, LAD irregularities >>  subacute stent thrombosis, Promus DES PCI distal overlap // c) 09/2017 - Ant STEMI - LAD-D2 PCI -  Successful PCI of LAD (3 overlapping DES), unable to restore flow down D2l that was stented as well.  Likely related to downstream dissection, but unable to rewire.  . Daily headache   . Depression   . Dyslipidemia, goal LDL below 70   . History of Doppler ultrasound    carotid bruit >> a. Carotid US 6/17: bilat ICA 1-39%  . History of Takotsubo cardiomyopathy 11/2018   Admitted for acute on chronic combined systolic and diastolic heart failure.  EF down to 20-25% global hypokinesis.  Was in the setting of grieving over the loss of her son from MI.  ->  EF improved to 45-50% on follow-up echocardiogram.  . Hypertension   . Ischemic cardiomyopathy 06/2018   s/p inferior STEMI followed by 2 anterior STEMIs: Following recovery from Takotsubo Cardiomyopathy- Echo 02/14/19: Moderate LVH.  EF improved up to 45-50%.  GR 1 DD.  Severe apical akinesis.  Normal valves.  . Mild dysplasia of cervix ([place] I) 02/04/2019   Seen after colposcopy on 01/23/19 done for ASCU +HPV pap  > repeat pap and HPV test in 12 and 24 months  . STEMI involving left anterior descending coronary artery ([place]) 09/2017; 06/2018   a) Severe [place] 1, 1, 1 LAD-D2 lesion (complicated by post PTCA dissection/intramural hematoma) -successful extensive PCI of the LAD but unable to maintain patency of the stented D2. b) distal mLAD stent  Edge 100% thrombosis --> PTCA & Overlapping DES PCI (Synergy 3 x 12 --> 3.6-3.3 mm). D2 occluded. RCA stents patent, 65 % OM2. EF 30-35%.  Marland Kitchen STEMI involving oth coronary artery of inferior wall ([place]) 03/2013   Secondary to subacute stent thrombosis of RCA stent segment having missed 4 doses of Effient.  . Tobacco abuse 01/11/2016  . Type II diabetes mellitus ([place]) 12/2010   sister and son also diabetic     Past Surgical History:  Procedure Laterality Date  . CHOLECYSTECTOMY  ~ 2000  . CORONARY STENT INTERVENTION N/A 10/10/2017   Procedure: CORONARY STENT INTERVENTION;  Surgeon: Leonie Man, MD;   Location: [place] [facility];  Service: Cardiovascular; LAD PCI: STENT SYNERGY DES 3.5X32 (p),STENT SYNERGY DES 3.5X 8 (m), STENT SYNERGY DES 3.5X28 (d).  D2 PCI: STENT SYNERGY DES 2.5X24.  (UNABL TO REWIRE & EXPAND - TO OF DIAG AT END OF CASE)  . CORONARY STENT INTERVENTION N/A 07/03/2018   Procedure: CORONARY STENT INTERVENTION;  Surgeon: Leonie Man, MD;  Location: [facility];; PTCA & overlapping DES PCI (overlaps prior stents distally) - Synergy DES 3 x 12 (3.6 mm @ overlap -- 3.3 mm distally)>  . LEFT HEART CATH AND CORONARY ANGIOGRAPHY N/A 10/10/2017   Procedure: LEFT HEART CATH AND CORONARY ANGIOGRAPHY;  Surgeon: Leonie Man, MD;  Location: [place] [facility];  Service: Cardiovascular;  Laterality: N/A; -patent RCA stents with mild in-stent restenosis. [place] 1,1,1 mLD-D2 68% (complicated by dissection/intramural thrombus)  . LEFT HEART CATH AND CORONARY ANGIOGRAPHY N/A 07/03/2018   Procedure: LEFT HEART CATH AND CORONARY ANGIOGRAPHY;  Surgeon: Leonie Man, MD;  Location: [place] [facility];; 100% thrombotic occlusion distal LAD stent -- DES PCI. continued 100% D2 (previously lost). patent RCA stents, 65% OM2.  EF 30-35%.  Marland Kitchen LEFT HEART CATHETERIZATION WITH CORONARY ANGIOGRAM N/A 02/25/2013   Procedure: LEFT HEART CATHETERIZATION WITH CORONARY ANGIOGRAM;  Surgeon: Laverda Page, MD;  Location: [facility];  Service: Cardiovascular;; CTO m RCA; otherwise normal coronaries  . LEFT HEART CATHETERIZATION WITH CORONARY ANGIOGRAM N/A 04/01/2013   Procedure: LEFT HEART CATHETERIZATION WITH CORONARY ANGIOGRAM;  Surgeon: Laverda Page, MD;  Location: [facility] CATH LAB;  Service: Cardiovascular: 100% occlusion of distal RCA stent (subacute stent thrombosis -  secondary to stopping Effient).  Overlapping DES PCI  . NM MYOVIEW LTD  12/2015    EF 38%.  Hypertensive response to exercise (231/132 mmHg).  INTERMEDIATE RISK due to -diffuse hypokinesis and reduced EF.  No ischemia or infarction.   Marland Kitchen PERCUTANEOUS CORONARY STENT INTERVENTION (PCI-S)  04/01/2013   Procedure: PERCUTANEOUS CORONARY STENT INTERVENTION (PCI-S);  Surgeon: Laverda Page, MD;  Location: [facility] CATH LAB;  Service: Cardiovascular;; PCI of distal RCA stent subacute thrombosis: Promus Premier DES 3.0 mm x 20 mm.  Marland Kitchen PERCUTANEOUS CORONARY STENT INTERVENTION (PCI-S)  02/25/2013   Procedure: PERCUTANEOUS CORONARY STENT INTERVENTION (PCI-S);  Surgeon: Laverda Page, MD;  Location: [facility] CATH LAB;  RCA CTO PCI with overlapping Promus DES: 3.0 mm x 38 mm, 3.5 mm x 51m  . RIGHT/LEFT HEART CATH AND CORONARY ANGIOGRAPHY N/A 11/04/2019   Procedure: RIGHT/LEFT HEART CATH AND CORONARY ANGIOGRAPHY;  Surgeon: HLeonie Man MD;  [facility];; Mildly elevated LVEDP.  Ostial D1 100%.  Proximal to mid LAD stent 10% ISR.  Mid and distal stent widely patent.  30% distal distance.  Ostial OM 2 55%.  Proximal to mid RCA stent 50% stenosis.  Mid to distal RCA 40%.  Suspect that reduced EF is related to hypertensive emergen  . TRANSTHORACIC ECHOCARDIOGRAM  07/04/2018   recurrent Anterior STEMI: EF 35-40%.  Apical hypokinesis.  GRII DD.  No thrombus noted.  (Improved EF from 30% up to 35-40% from previous echo)  . TRANSTHORACIC ECHOCARDIOGRAM  11/2018; 01/2019   a) TAKOTUSBO CM: EF 20-25%-diffuse HK..  Mod V thickness.  GRII DD w/ high LVEDP.  Severe LA dilation;; b) 02/14/19: Moderate LVH.  EF improved up to 45-50%.  GR 1 DD.  Severe apical akinesis.  Normal valves.  . TRANSTHORACIC ECHOCARDIOGRAM  10/31/2019   Hypertensive emergency, hypoxic/hypercapnic respiratory failure;  EF 20 to 25%.  Global HK.  Mildly dilated LV.  Mildly elevated PA pressures.  Mild LA dilation.  Mild AR.  Mildly elevated RAP-8 mmHg.=> Eventual cath showed no change  . TUBAL LIGATION  1994    Family History  Problem Relation Age of Onset  . Diabetes Mother   . Diabetes Sister   . Diabetes Sister   . Diabetes Son        type 1    Allergies   Allergen Reactions  . Farxiga [Dapagliflozin] Other (See Comments)    Caused UTI and polyps on kidneys     Outpatient Medications Prior to Visit  Medication Sig Dispense Refill  . blood glucose meter kit and supplies Dispense based on patient and insurance preference. Use up to four times daily as directed. (FOR ICD-10 E10.9, E11.9). 1 each 0  . Blood Glucose Monitoring Suppl (TRUE METRIX METER) DEVI 1 kit by Does not apply route 4 (four) times daily. 1 Device 0  . BRILINTA 60 MG TABS tablet TAKE 1 TABLET (60 MG TOTAL) BY MOUTH 2 (TWO) TIMES DAILY. 180 tablet 11  . carvedilol (COREG) 25 MG tablet Take 1 tablet (25 mg total) by mouth 2 (two) times daily. 180 tablet 3  . cefUROXime (CEFTIN) 250 MG tablet Take 1 tablet (250 mg total) by mouth 2 (two) times daily with a meal. 14 tablet 0  . furosemide (LASIX) 20 MG tablet Take 76m tablet by mouth daily and may take an additional 270m 4084mf weight is more than 3 to 5lbs. (Patient taking differently: Take 20 mg by mouth 2 (two) times daily.) 120 tablet 3  . glucose blood test strip Use as instructed 100 each 12  . metroNIDAZOLE (FLAGYL) 500 MG tablet Take 1 tablet (500 mg total) by mouth 2 (two) times daily. 14 tablet 0  . naproxen sodium (ALEVE) 220 MG tablet Take 220 mg by mouth daily as needed (pain).    . nicotine (NICODERM CQ - DOSED IN MG/24 HOURS) 14 mg/24hr patch Place 1 patch (14 mg total) onto the skin daily. 28 patch 0  . nitroGLYCERIN (NITROSTAT) 0.4 MG SL tablet Place 1 tablet (0.4 mg total) under the tongue every 5 (five) minutes as needed for chest pain. Reported on 11/25/2015 (Patient taking differently: Place 0.4 mg under the tongue every 5 (five) minutes as needed for chest pain (max 3 doses).) 25 tablet 3  . pantoprazole (PROTONIX) 40 MG tablet Take 1 tablet (40 mg total) by mouth daily. 90 tablet 3  . sacubitril-valsartan (ENTRESTO) 97-103 MG Take 1 tablet by mouth 2 (two) times daily. 180 tablet 0  . spironolactone (ALDACTONE)  25 MG tablet Take 1 tablet (25 mg total) by mouth 2 (two) times daily. 180 tablet 3  . TRUEPLUS LANCETS 28G MISC 28 g by Does not apply route  4 (four) times daily. 120 each 2  . atorvastatin (LIPITOR) 80 MG tablet Take 1 tablet (80 mg total) by mouth daily. 90 tablet 1  . gabapentin (NEURONTIN) 300 MG capsule Take 1 capsule (300 mg total) by mouth at bedtime. 90 capsule 1  . glipiZIDE (GLUCOTROL) 10 MG tablet Take 1 tablet (10 mg total) by mouth 2 (two) times daily before a meal. 180 tablet 1  . hydrALAZINE (APRESOLINE) 50 MG tablet Take 46m twice a day.  Pt may take an additional tablet (563m if pt's blood pressure is greater than 15161WRUEystolic (top number). (Patient taking differently: Take 50 mg by mouth 3 (three) times daily.) 210 tablet 3  . insulin glargine (LANTUS SOLOSTAR) 100 UNIT/ML Solostar Pen Inject 38 Units into the skin daily. 30 mL 6   No facility-administered medications prior to visit.     ROS Review of Systems  Constitutional: Negative for activity change, appetite change and fatigue.  HENT: Negative for congestion, sinus pressure and sore throat.   Eyes: Negative for visual disturbance.  Respiratory: Negative for cough, chest tightness, shortness of breath and wheezing.   Cardiovascular: Positive for palpitations. Negative for chest pain.  Gastrointestinal: Negative for abdominal distention, abdominal pain and constipation.  Endocrine: Negative for polydipsia.  Genitourinary: Negative for dysuria and frequency.  Musculoskeletal: Negative for arthralgias and back pain.  Skin: Negative for rash.  Neurological: Positive for headaches. Negative for tremors, light-headedness and numbness.  Hematological: Does not bruise/bleed easily.  Psychiatric/Behavioral: Negative for agitation and behavioral problems.    Objective:  BP (!) 188/80   Pulse 65   Ht 5' 9"  (1.753 m)   Wt 213 lb 6.4 oz (96.8 kg)   LMP 10/20/2015   SpO2 100%   BMI 31.51 kg/m   BP/Weight  10/19/2020 10/15/2020 1245/40/9811Systolic BP [id number] BP 80 62 69  Wt. (Lbs) 213.4 212.8 220.46  BMI 31.51 31.43 32.56      Physical Exam Constitutional:      Appearance: She is well-developed.  Neck:     Vascular: No JVD.  Cardiovascular:     Rate and Rhythm: Normal rate.     Heart sounds: Murmur (systolic 3/6) heard.    Pulmonary:     Effort: Pulmonary effort is normal.     Breath sounds: Normal breath sounds. No wheezing or rales.  Chest:     Chest wall: No tenderness.  Abdominal:     General: Bowel sounds are normal. There is no distension.     Palpations: Abdomen is soft. There is no mass.     Tenderness: There is no abdominal tenderness.  Musculoskeletal:        General: Normal range of motion.     Right lower leg: No edema.     Left lower leg: No edema.  Neurological:     Mental Status: She is alert and oriented to person, place, and time.  Psychiatric:        Mood and Affect: Mood normal.     CMP Latest Ref Rng & Units 10/07/2020 07/22/2020 07/21/2020  Glucose 65 - 99 mg/dL 219(H) 156(H) 215(H)  BUN 6 - 24 mg/dL 10 9 10   Creatinine 0.57 - 1.00 mg/dL 0.94 0.79 0.99  Sodium 134 - 144 mmol/L 142 138 140  Potassium 3.5 - 5.2 mmol/L 3.7 3.0(L) 3.1(L)  Chloride 96 - 106 mmol/L 103 104 109  CO2 20 - 29 mmol/L 22 23 19(L)  Calcium 8.7 - 10.2 mg/dL 9.9  8.1(L) 8.4(L)  Total Protein 6.0 - 8.5 g/dL 8.0 - -  Total Bilirubin 0.0 - 1.2 mg/dL 0.6 - -  Alkaline Phos 44 - 121 IU/L 58 - -  AST 0 - 40 IU/L 17 - -  ALT 0 - 32 IU/L 12 - -    Lipid Panel     Component Value Date/Time   CHOL 171 10/07/2020 1043   TRIG 103 10/07/2020 1043   HDL 41 10/07/2020 1043   CHOLHDL 4.2 10/07/2020 1043   CHOLHDL 4.2 07/05/2018 0330   VLDL 15 07/05/2018 0330   LDLCALC 111 (H) 10/07/2020 1043    CBC    Component Value Date/Time   WBC 11.4 (H) 10/07/2020 1043   WBC 9.8 07/22/2020 0320   RBC 4.81 10/07/2020 1043   RBC 3.54 (L) 07/22/2020 0320   HGB 12.7  10/07/2020 1043   HCT 39.3 10/07/2020 1043   PLT 409 10/07/2020 1043   MCV 82 10/07/2020 1043   MCH 26.4 (L) 10/07/2020 1043   MCH 26.6 07/22/2020 0320   MCHC 32.3 10/07/2020 1043   MCHC 33.5 07/22/2020 0320   RDW 13.4 10/07/2020 1043   LYMPHSABS 1.8 07/20/2020 1932   MONOABS 1.0 07/20/2020 1932   EOSABS 0.2 07/20/2020 1932   BASOSABS 0.1 07/20/2020 1932    Lab Results  Component Value Date   HGBA1C 8.2 (A) 10/19/2020    Assessment & Plan:  1. Uncontrolled type 2 diabetes mellitus with hyperglycemia (HCC) A1c of 8.2; goal is less than 7.0 Unable to tolerate [place] due to UTI and pyelonephritis; patient would rather not be on it Unable to use GLP-1 due to recent complaints of symptoms suspicious for gastroparesis Increase Lantus dose Counseled on Diabetic diet, my plate method, 546 minutes of moderate intensity exercise/week Blood sugar logs with fasting goals of 80-120 mg/dl, random of less than 180 and in the event of sugars less than 60 mg/dl or greater than 400 mg/dl encouraged to notify the clinic. Advised on the need for annual eye exams, annual foot exams, Pneumonia vaccine. - POCT glucose (manual entry) - POCT glycosylated hemoglobin (Hb A1C) - atorvastatin (LIPITOR) 80 MG tablet; Take 1 tablet (80 mg total) by mouth daily.  Dispense: 90 tablet; Refill: 1 - glipiZIDE (GLUCOTROL) 10 MG tablet; Take 1 tablet (10 mg total) by mouth 2 (two) times daily before a meal.  Dispense: 180 tablet; Refill: 1 - insulin glargine (LANTUS SOLOSTAR) 100 UNIT/ML Solostar Pen; Inject 42 Units into the skin daily.  Dispense: 30 mL; Refill: 6  2. Other diabetic neurological complication associated with type 2 diabetes mellitus (HCC) Controlled - gabapentin (NEURONTIN) 300 MG capsule; Take 1 capsule (300 mg total) by mouth at bedtime.  Dispense: 90 capsule; Refill: 1  3. Screening for colon cancer Would love to refer for colonoscopy however she has no medical coverage and does not have the  [facility] financial discount - Fecal occult blood, imunochemical([facility])  4. Coronary artery disease involving native coronary artery of native heart with unstable angina pectoris [facility]) She continues to be symptomatic Currently on Brilinta Advised to check her vitals in those instances where she has palpitations while lying down and if symptoms persist she might have to be referred back to cardiology for sooner appointment.  Cannot exclude underlying anxiety as well   5. Hypertensive heart disease with chronic combined systolic and diastolic congestive heart failure ([place]) Uncontrolled hypertension which can also explain headaches At the end of the encounter she stated headaches had improved. I have  sent an updated prescription for hydralazine to her pharmacy She will follow up with the clinical pharmacist in 2 weeks to reassess her blood pressure EF of 20 to 25% Unable to tolerate SGLT2 Euvolemic She is a high risk patient Needs to follow-up closely with cardiology - hydrALAZINE (APRESOLINE) 100 MG tablet; Take 1 tablet (100 mg total) by mouth 3 (three) times daily.  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 atorvastatin (LIPITOR) 80 MG tablet    Sig: Take 1 tablet (80 mg total) by mouth daily.    Dispense:  90 tablet    Refill:  1  . gabapentin (NEURONTIN) 300 MG capsule    Sig: Take 1 capsule (300 mg total) by mouth at bedtime.    Dispense:  90 capsule    Refill:  1  . hydrALAZINE (APRESOLINE) 100 MG tablet    Sig: Take 1 tablet (100 mg total) by mouth 3 (three) times daily.    Dispense:  90 tablet    Refill:  3  . glipiZIDE (GLUCOTROL) 10 MG tablet    Sig: Take 1 tablet (10 mg total) by mouth 2 (two) times daily before a meal.    Dispense:  180 tablet    Refill:  1  . insulin glargine (LANTUS SOLOSTAR) 100  UNIT/ML Solostar Pen    Sig: Inject 42 Units into the skin daily.    Dispense:  30 mL    Refill:  6    Follow-up: Return in about 2 weeks (around 11/02/2020) for BP evaluation with Lurena Joiner.       [name], MD, FAAFP. [facility] [place], [place]   10/19/2020, 10:57 AM

## 2020-10-19 NOTE — Telephone Encounter (Signed)
lmom for a callback regarding lab results. Pt also needs appt for a pharmd for diabetes

## 2020-10-20 ENCOUNTER — Telehealth: Payer: Self-pay

## 2020-10-20 NOTE — Telephone Encounter (Signed)
lmom for a callback regarding lab results. Pt also needs appt for a pharmd for diabetes

## 2020-10-21 NOTE — Telephone Encounter (Signed)
Pt called and we discussed results and scheduled lipid and diabetes pharmd appt. Pt voiced understanding

## 2020-10-28 MED FILL — CARVEDILOL 25 MG TABLET: 25 | 30 days supply | Qty: 60 | Fill #5

## 2020-10-28 MED FILL — $BRILINTA 60 MG TABLET: 60 | 90 days supply | Qty: 180 | Fill #1

## 2020-10-28 MED FILL — SPIRONOLACTONE 25 MG TABLET: 25 | 30 days supply | Qty: 60 | Fill #8

## 2020-10-30 ENCOUNTER — Other Ambulatory Visit: Payer: Self-pay

## 2020-11-02 ENCOUNTER — Other Ambulatory Visit: Payer: Self-pay

## 2020-11-02 ENCOUNTER — Ambulatory Visit: Payer: Self-pay | Attending: Family Medicine | Admitting: Pharmacist

## 2020-11-02 ENCOUNTER — Encounter: Payer: Self-pay | Admitting: Pharmacist

## 2020-11-02 VITALS — BP 170/78

## 2020-11-02 DIAGNOSIS — I5042 Chronic combined systolic (congestive) and diastolic (congestive) heart failure: Secondary | ICD-10-CM

## 2020-11-02 DIAGNOSIS — I110 Hypertensive heart disease with heart failure: Secondary | ICD-10-CM

## 2020-11-02 NOTE — Progress Notes (Signed)
   S:    PCP: Dr. Margarita Rana   Pt is a 50 YO patient with a PMHx of CAD, ischemic cardiomyopathy, chronic combined systolic and diastolic heart failure, hyperlipidemia, hypertension, DM2 and chronic tobacco abuse.  Patient arrives in good spirits. Presents to the clinic for hypertension evaluation, counseling, and management. Patient was referred and last seen by Primary Care Provider on 10/19/2020. BP at that visit was 188/80. Dr. Margarita Rana increased hydralazine to 100 mg TID.   Medication adherence: today, pt tells me she is taking the hydralazine 100 mg BID most of the time. She does not have time to take TID when working.   She denies chest pain, dyspnea today. Denies HA, blurred vision, dizziness. No PND, LE edema - no changes from her baseline.   Current BP Medications include:  Carvedilol 25 mg BID, hydralazine 100 mg TID, Entresto 97-103 mg BID, spironolactone 25 mg daily   Dietary habits include: compliant with salt restriction; working to avoid caffeine  Exercise habits include: none outside of work  Family / Social history:  - FHx: DM - Tobacco: current every day smoker  - Alcohol: denies use    O:  Vitals:   11/02/20 0900  BP: (!) 170/78   Home BP readings: none  Last 3 Office BP readings: BP Readings from Last 3 Encounters:  10/19/20 (!) 188/80  10/15/20 (!) 154/62  07/22/20 (!) 147/69   BMET    Component Value Date/Time   NA 142 10/07/2020 1043   K 3.7 10/07/2020 1043   CL 103 10/07/2020 1043   CO2 22 10/07/2020 1043   GLUCOSE 219 (H) 10/07/2020 1043   GLUCOSE 156 (H) 07/22/2020 0320   BUN 10 10/07/2020 1043   CREATININE 0.94 10/07/2020 1043   CREATININE 0.59 02/17/2015 1031   CALCIUM 9.9 10/07/2020 1043   GFRNONAA >60 07/22/2020 0320   GFRAA 89 03/17/2020 1500    Renal function: CrCl cannot be calculated (Patient's most recent lab result is older than the maximum 21 days allowed.).  Clinical ASCVD: Yes  The ASCVD Risk score Mikey Bussing DC Jr., et al., 2013)  failed to calculate for the following reasons:   The patient has a prior MI or stroke diagnosis  A/P: Hypertension longstanding currently above goal on current medications. BP Goal = < 130/80 mmHg. Medication adherence is a problem with TID hydralazine. We discussed this today and she will inform her workplace that she must be allowed time to take her medication during her shift. We also discussed the possibility of her using a phone alarm and she is amenable to doing so. With her HF hx, may consider BiDil or adding isosorbide dinitrate to her regimen. Will reach out to Cardiologist regarding this.  -Continued current regimen. Advised to take hydralazine TID as prescribed.  -F/u labs ordered - none  -Message sent to cardiologist to consider BiDil -Counseled on lifestyle modifications for blood pressure control including reduced dietary sodium, increased exercise, adequate sleep.  Results reviewed and written information provided.   Total time in face-to-face counseling 30 minutes.   F/U Clinic Visit in 3 weeks.   Benard Halsted, PharmD, [place], [place] [phone]

## 2020-11-11 ENCOUNTER — Ambulatory Visit (INDEPENDENT_AMBULATORY_CARE_PROVIDER_SITE_OTHER): Payer: Self-pay | Admitting: Pharmacist

## 2020-11-11 ENCOUNTER — Other Ambulatory Visit: Payer: Self-pay

## 2020-11-11 VITALS — BP 126/78 | HR 69 | Resp 16 | Ht 69.0 in | Wt 211.6 lb

## 2020-11-11 DIAGNOSIS — I2102 ST elevation (STEMI) myocardial infarction involving left anterior descending coronary artery: Secondary | ICD-10-CM

## 2020-11-11 DIAGNOSIS — E785 Hyperlipidemia, unspecified: Secondary | ICD-10-CM

## 2020-11-11 NOTE — Progress Notes (Signed)
Patient ID: Amy Chaney                 DOB: Oct 26, 1970                    MRN: [id number]     HPI:  Amy Chaney is a 50 y.o. female patient referred to Pharm[facility] by Dr Ellyn Hack. PMH is significant for  CAD s/p STEMI, and angioplasty with coronary stent, ischemic cardiomyopathy, HFrEF, hyperlipidemia, hypertension, DM-II, and chronic tobacco abuse.  Her son is type I diabetic with recent amputation. Patient presents for potential PCSK9i initiation and counseling. Noted patient's insurance don't include medication drug plan and currently received Entresto free from patient assistance program.  Current Medications:  Atorvastatin 43m daily - 8am  HF medication: Carvedilol 261mtwice daily (8am and 5 or 7pm) Furosemide 2071maily Hydralazine 100m67mD Entresto 97-103mg63mce daily Spironolactone 25mg 64my  Intolerances:  faxiga - UTI and polyps on kidney  LDL goal: <70mg/d23miet: 50/50 take out and home cook meals (eats burgers and lots of fried foods), limits portions.  Exercise: walks 15-20 minutes twice per week  Family History: diabetes in mother, sisters, and son. Bother has stent, sister heart problems, stroke in nephew in his 40s  So53sl History: current smoker 4-5 cigarettes per day  Labs: 10/07/2020: CHO 171, TG 103, HDL 41, LDL 111 (atorvastatin 80mg)  45m Medical History:  Diagnosis Date  . Coronary artery disease involving native coronary artery of native heart with angina pectoris (HCC) 07/[place]4   a. s/p PCI of the mid RCA 01/2013 //  b. LHC 9/14: EF 55%, RCA stent 100% occluded, LAD irregularities >>  subacute stent thrombosis, Promus DES PCI distal overlap // c) 09/2017 - Ant STEMI - LAD-D2 PCI - Successful PCI of LAD (3 overlapping DES), unable to restore flow down D2l that was stented as well.  Likely related to downstream dissection, but unable to rewire.  . Daily headache   . Depression   . Dyslipidemia, goal LDL below 70   . History of Doppler  ultrasound    carotid bruit >> a. Carotid US 6/17:[place] ICA 1-39%  . History of Takotsubo cardiomyopathy 11/2018   Admitted for acute on chronic combined systolic and diastolic heart failure.  EF down to 20-25% global hypokinesis.  Was in the setting of grieving over the loss of her son from MI.  ->  EF improved to 45-50% on follow-up echocardiogram.  . Hypertension   . Ischemic cardiomyopathy 06/2018   s/p inferior STEMI followed by 2 anterior STEMIs: Following recovery from Takotsubo Cardiomyopathy- Echo 02/14/19: Moderate LVH.  EF improved up to 45-50%.  GR 1 DD.  Severe apical akinesis.  Normal valves.  . Mild dysplasia of cervix (CIN I) 02/04/2019   Seen after colposcopy on 01/23/19 done for ASCU +HPV pap  > repeat pap and HPV test in 12 and 24 months  . STEMI involving left anterior descending coronary artery (HCC) 03/[place]9; 06/2018   a) Severe [place] 1, 1, 1 LAD-D2 lesion (complicated by post PTCA dissection/intramural hematoma) -successful extensive PCI of the LAD but unable to maintain patency of the stented D2. b) distal mLAD stent Edge 100% thrombosis --> PTCA & Overlapping DES PCI (Synergy 3 x 12 --> 3.6-3.3 mm). D2 occluded. RCA stents patent, 65 % OM2. EF 30-35%.  . STEMI Mar[place] Kitchennvolving oth coronary artery of inferior wall (HCC) 09/[place]4   Secondary to subacute stent thrombosis of RCA stent segment having  missed 4 doses of Effient.  . Tobacco abuse 01/11/2016  . Type II diabetes mellitus ([place]) 12/2010   sister and son also diabetic     Current Outpatient Medications on File Prior to Visit  Medication Sig Dispense Refill  . atorvastatin (LIPITOR) 80 MG tablet TAKE 1 TABLET (80 MG TOTAL) BY MOUTH DAILY. 90 tablet 1  . blood glucose meter kit and supplies Dispense based on patient and insurance preference. Use up to four times daily as directed. (FOR ICD-10 E10.9, E11.9). 1 each 0  . Blood Glucose Monitoring Suppl (TRUE METRIX METER) DEVI 1 kit by Does not apply route 4 (four) times daily. 1  Device 0  . carvedilol (COREG) 25 MG tablet TAKE 1 TABLET (25 MG TOTAL) BY MOUTH 2 (TWO) TIMES DAILY. 180 tablet 3  . furosemide (LASIX) 20 MG tablet TAKE 1 TABLET BY MOUTH DAILY. MAY TAKE ADDITIONAL TABLET 1 TO 2 TABLETS IF WEIGHT IS MORE THAN 3 TO 51BS. (Patient taking differently: Take 20 mg by mouth 2 (two) times daily.) 120 tablet 3  . gabapentin (NEURONTIN) 300 MG capsule TAKE 1 CAPSULE (300 MG TOTAL) BY MOUTH AT BEDTIME. 90 capsule 1  . glipiZIDE (GLUCOTROL) 10 MG tablet TAKE 1 TABLET (10 MG TOTAL) BY MOUTH 2 (TWO) TIMES DAILY BEFORE A MEAL. 180 tablet 1  . hydrALAZINE (APRESOLINE) 100 MG tablet TAKE 1 TABLET (100 MG TOTAL) BY MOUTH 3 (THREE) TIMES DAILY. 90 tablet 3  . Insulin Glargine (BASAGLAR KWIKPEN) 100 UNIT/ML INJECT 38 UNITS INTO THE SKIN DAILY. 30 mL 6  . naproxen sodium (ALEVE) 220 MG tablet Take 220 mg by mouth daily as needed (pain).    . nicotine (NICODERM CQ - DOSED IN MG/24 HOURS) 14 mg/24hr patch PLACE 1 PATCH (14 MG TOTAL) ONTO THE SKIN DAILY. 28 patch 0  . nitroGLYCERIN (NITROSTAT) 0.4 MG SL tablet Place 1 tablet (0.4 mg total) under the tongue every 5 (five) minutes as needed for chest pain. Reported on 11/25/2015 (Patient taking differently: Place 0.4 mg under the tongue every 5 (five) minutes as needed for chest pain (max 3 doses).) 25 tablet 3  . pantoprazole (PROTONIX) 40 MG tablet TAKE 1 TABLET (40 MG TOTAL) BY MOUTH DAILY. 90 tablet 3  . sacubitril-valsartan (ENTRESTO) 97-103 MG Take 1 tablet by mouth 2 (two) times daily. 180 tablet 0  . spironolactone (ALDACTONE) 25 MG tablet TAKE 1 TABLET (25 MG TOTAL) BY MOUTH 2 (TWO) TIMES DAILY. 180 tablet 3  . ticagrelor (BRILINTA) 60 MG TABS tablet TAKE 1 TABLET (60 MG TOTAL) BY MOUTH 2 (TWO) TIMES DAILY. 180 tablet 11  . TRUEPLUS LANCETS 28G MISC 28 g by Does not apply route 4 (four) times daily. 120 each 2  . insulin glargine (LANTUS) 100 UNIT/ML Solostar Pen INJECT 42 UNITS INTO THE SKIN DAILY. (Patient not taking: Reported on  11/11/2020) 30 mL 6  . [DISCONTINUED] buPROPion (WELLBUTRIN SR) 150 MG 12 hr tablet Take 1 tablet (150 mg total) by mouth 2 (two) times daily. 60 tablet 3   No current facility-administered medications on file prior to visit.    Allergies  Allergen Reactions  . Wilder Glade [Dapagliflozin] Other (See Comments)    Caused UTI and polyps on kidneys     Dyslipidemia, goal LDL below 70 LDL remains above goal for secondary prevention while on high intensity statin. We discussed PCSk9i therapy (Repatha/Praluent). Patient is a great candidate for PCSK9i, but her current insurance does NOT cover prescriptions.   [employer] Safety[employer] form was  provided during visit. Will apply for Repatha SureClick 14YWV every 14 days, and follow up as needed. Plan to repeat fasting blood work after 4 doses of new medication.   Kayce Chismar Rodriguez-Guzman PharmD, BCPS, [place] [id number] 11/19/2020 5:09 PM

## 2020-11-11 NOTE — Patient Instructions (Addendum)
Your Results:             Your most recent labs Goal  Total Cholesterol 171 < 200  Triglycerides 103 < 150  HDL (good cholesterol) 41 > 40  LDL (bad cholesterol) 111 < 70     Medication changes: *Will process prior authorization for Repatha SureClick 140mg  every 14 days *  Lab orders: *Repeat fasting blood work after 4 doses of new cholesterol medication  Clinic phone number: [phone] (Dequane Strahan/[place]rist[place])  *Discuss start Rybelsus 3mg  or Ozempic therapy daily with Primary Care   Thank you for choosing [facility]

## 2020-11-19 ENCOUNTER — Encounter: Payer: Self-pay | Admitting: Pharmacist

## 2020-11-19 ENCOUNTER — Other Ambulatory Visit: Payer: Self-pay | Admitting: Family Medicine

## 2020-11-19 ENCOUNTER — Other Ambulatory Visit: Payer: Self-pay

## 2020-11-19 MED FILL — Pantoprazole Sodium EC Tab 40 MG (Base Equiv): ORAL | 30 days supply | Qty: 30 | Fill #0 | Status: AC

## 2020-11-19 MED FILL — Gabapentin Cap 300 MG: ORAL | 30 days supply | Qty: 30 | Fill #0 | Status: AC

## 2020-11-19 NOTE — Telephone Encounter (Signed)
Requested medication (s) are due for refill today: Yes  Requested medication (s) are on the active medication list: Yes  Last refill:  10/23/17  Future visit scheduled: Yes  Notes to clinic:  Unable to refill per protocol, Rx expired, see highlighted from pharmacy     Requested Prescriptions  Pending Prescriptions Disp Refills   glucose blood (TRUE METRIX BLOOD GLUCOSE TEST) test strip 100 strip 12    Sig: USE AS INSTRUCTED      Endocrinology: Diabetes - Testing Supplies Passed - 11/19/2020  4:42 PM      Passed - Valid encounter within last 12 months    Recent Outpatient Visits           2 weeks ago Hypertensive heart disease with chronic combined systolic and diastolic congestive heart failure [facility])   [place], Annie Main L, RPH-CPP   1 month ago Uncontrolled type 2 diabetes mellitus with hyperglycemia ([place])   [place], Enobong, MD   3 months ago [place], Deborah B, MD   7 months ago Uncontrolled type 2 diabetes mellitus with hyperglycemia [facility])   [place], Charlane Ferretti, MD   9 months ago Uncontrolled type 2 diabetes mellitus with hyperglycemia [facility])   [place], MD       Future Appointments             In 3 days Daisy Blossom, Jarome Matin, [place]   In 2 months Ellyn Hack, Leonie Green, MD [facility] [place], [facility]

## 2020-11-19 NOTE — Assessment & Plan Note (Signed)
LDL remains above goal for secondary prevention while on high intensity statin. We discussed PCSk9i therapy (Repatha/Praluent). Patient is a great candidate for PCSK9i, but her current insurance does NOT cover prescriptions.   [employer] Safety[employer] form was provided during visit. Will apply for Repatha SureClick 06YOK every 14 days, and follow up as needed. Plan to repeat fasting blood work after 4 doses of new medication.

## 2020-11-22 ENCOUNTER — Encounter: Payer: Self-pay | Admitting: Pharmacist

## 2020-11-22 ENCOUNTER — Other Ambulatory Visit: Payer: Self-pay

## 2020-11-22 ENCOUNTER — Telehealth: Payer: Self-pay

## 2020-11-22 ENCOUNTER — Ambulatory Visit: Payer: Self-pay | Attending: Family Medicine | Admitting: Pharmacist

## 2020-11-22 VITALS — BP 142/72

## 2020-11-22 DIAGNOSIS — I110 Hypertensive heart disease with heart failure: Secondary | ICD-10-CM

## 2020-11-22 DIAGNOSIS — I5042 Chronic combined systolic (congestive) and diastolic (congestive) heart failure: Secondary | ICD-10-CM

## 2020-11-22 MED ORDER — TRUE METRIX BLOOD GLUCOSE TEST VI STRP
ORAL_STRIP | 12 refills | Status: AC
  Filled 2020-11-22: qty 100, 25d supply, fill #0
  Filled 2021-01-03: qty 100, 25d supply, fill #1
  Filled 2021-03-31: qty 100, 25d supply, fill #2
  Filled 2021-09-12: qty 100, 25d supply, fill #0

## 2020-11-22 NOTE — Telephone Encounter (Signed)
-----   Message from Harrington Challenger, [place] sent at 11/19/2020  5:02 PM EDT ----- Regarding: [place] Patient was to return complete [employer] form for Repatha (no medical insurance).  Please follow up with patient  thanks

## 2020-11-22 NOTE — Telephone Encounter (Signed)
Called and lmomed the pt that we still haven't received paperwork and to please return it so that we can get the medication to her asap

## 2020-11-22 NOTE — Progress Notes (Signed)
   S:    PCP: Dr. Margarita Rana   Pt is a 50 YO patient with a PMHx of CAD, ischemic cardiomyopathy, chronic combined systolic and diastolic heart failure, hyperlipidemia, hypertension, DM2 and chronic tobacco abuse.  Patient arrives in good spirits. Presents to the clinic for hypertension evaluation, counseling, and management. Patient was referred and last seen by Primary Care Provider on 10/19/2020. I saw her on 11/02/20 and made no medication changes. I encouraged her to take hydralazine TID instead of BID as prescribed.   Medication adherence reported. She has a HA today and has not eaten lunch.   She denies chest pain, dyspnea today. Denies blurred vision, dizziness. No PND, LE edema - no changes from her baseline.   Current BP Medications include:  Carvedilol 25 mg BID, hydralazine 100 mg TID, Entresto 97-103 mg BID, spironolactone 25 mg daily   Dietary habits include: compliant with salt restriction; working to avoid caffeine  Exercise habits include: none outside of work  Family / Social history:  - FHx: DM - Tobacco: current every day smoker  - Alcohol: denies use    O:  Vitals:   11/22/20 1630  BP: (!) 142/72   Home BP readings: none  Last 3 Office BP readings: BP Readings from Last 3 Encounters:  11/22/20 (!) 142/72  11/11/20 126/78  11/02/20 (!) 170/78   BMET    Component Value Date/Time   NA 142 10/07/2020 1043   K 3.7 10/07/2020 1043   CL 103 10/07/2020 1043   CO2 22 10/07/2020 1043   GLUCOSE 219 (H) 10/07/2020 1043   GLUCOSE 156 (H) 07/22/2020 0320   BUN 10 10/07/2020 1043   CREATININE 0.94 10/07/2020 1043   CREATININE 0.59 02/17/2015 1031   CALCIUM 9.9 10/07/2020 1043   GFRNONAA >60 07/22/2020 0320   GFRAA 89 03/17/2020 1500    Renal function: CrCl cannot be calculated (Patient's most recent lab result is older than the maximum 21 days allowed.).  Clinical ASCVD: Yes  The ASCVD Risk score Mikey Bussing DC Jr., et al., 2013) failed to calculate for the following  reasons:   The patient has a prior MI or stroke diagnosis  A/P: Hypertension longstanding currently above goal but improved on current medications. BP Goal = < 130/80 mmHg. Medication adherence reported. No med changes today.  -Continued current regimen. Advised to take hydralazine TID as prescribed.  -F/u labs ordered - none  -Counseled on lifestyle modifications for blood pressure control including reduced dietary sodium, increased exercise, adequate sleep.  Results reviewed and written information provided.   Total time in face-to-face counseling 30 minutes.   F/U Clinic Visit in 1 month.   Benard Halsted, PharmD, Para [place], [place] [phone]

## 2020-11-25 ENCOUNTER — Other Ambulatory Visit: Payer: Self-pay

## 2020-11-26 ENCOUNTER — Other Ambulatory Visit (HOSPITAL_COMMUNITY): Payer: Self-pay

## 2020-11-26 ENCOUNTER — Other Ambulatory Visit: Payer: Self-pay

## 2020-11-29 ENCOUNTER — Other Ambulatory Visit: Payer: Self-pay

## 2020-11-29 MED FILL — Spironolactone Tab 25 MG: ORAL | 30 days supply | Qty: 60 | Fill #0 | Status: AC

## 2020-12-01 ENCOUNTER — Other Ambulatory Visit: Payer: Self-pay

## 2020-12-02 ENCOUNTER — Other Ambulatory Visit: Payer: Self-pay

## 2020-12-02 MED FILL — Hydralazine HCl Tab 100 MG: ORAL | 30 days supply | Qty: 90 | Fill #0 | Status: AC

## 2020-12-09 NOTE — Telephone Encounter (Signed)
Called and lmomed the pt that they need to contact [place] because they are missing some information call [phone] and to call us if they have any questions

## 2020-12-16 ENCOUNTER — Telehealth: Payer: Self-pay

## 2020-12-16 DIAGNOSIS — E1169 Type 2 diabetes mellitus with other specified complication: Secondary | ICD-10-CM

## 2020-12-16 NOTE — Telephone Encounter (Signed)
Called and lmomed the pt that they need to contact [employer] because they are missing some information call [phone] and to call us if they have any questions 

## 2020-12-21 ENCOUNTER — Other Ambulatory Visit: Payer: Self-pay

## 2020-12-22 ENCOUNTER — Ambulatory Visit: Payer: Self-pay | Attending: Family Medicine | Admitting: Pharmacist

## 2020-12-22 ENCOUNTER — Other Ambulatory Visit: Payer: Self-pay

## 2020-12-22 ENCOUNTER — Encounter: Payer: Self-pay | Admitting: Pharmacist

## 2020-12-22 VITALS — BP 132/72

## 2020-12-22 DIAGNOSIS — I5042 Chronic combined systolic (congestive) and diastolic (congestive) heart failure: Secondary | ICD-10-CM

## 2020-12-22 DIAGNOSIS — I110 Hypertensive heart disease with heart failure: Secondary | ICD-10-CM

## 2020-12-22 MED FILL — Pantoprazole Sodium EC Tab 40 MG (Base Equiv): ORAL | 30 days supply | Qty: 30 | Fill #1 | Status: AC

## 2020-12-22 MED FILL — Gabapentin Cap 300 MG: ORAL | 30 days supply | Qty: 30 | Fill #1 | Status: AC

## 2020-12-22 NOTE — Progress Notes (Signed)
   S:    PCP: Dr. Margarita Rana   Pt is a 50 YO patient with a PMHx of CAD, ischemic cardiomyopathy, chronic combined systolic and diastolic heart failure, hyperlipidemia, hypertension, DM2 and chronic tobacco abuse.  Patient arrives in good spirits. Presents to the clinic for hypertension evaluation, counseling, and management. Patient was referred and last seen by Primary Care Provider on 10/19/2020. I saw her on 11/22/20 and made no medication changes. I encouraged her to remain compliant.   Medication adherence reported. She has a HA today and has not eaten lunch.   She denies chest pain, dyspnea today. Denies blurred vision, dizziness. No PND, LE edema - no changes from her baseline.   Current BP Medications include:  Carvedilol 25 mg BID, hydralazine 100 mg TID, Entresto 97-103 mg BID, spironolactone 25 mg daily   Dietary habits include: compliant with salt restriction; working to avoid caffeine  Exercise habits include: none outside of work  Family / Social history:  - FHx: DM - Tobacco: current every day smoker  - Alcohol: denies use    O:  Vitals:   12/22/20 1549  BP: 132/72   Home BP readings:  - Reports that when she does take it at home she averages in the 130s/90s  Last 3 Office BP readings: BP Readings from Last 3 Encounters:  12/22/20 132/72  11/22/20 (!) 142/72  11/11/20 126/78   BMET    Component Value Date/Time   NA 142 10/07/2020 1043   K 3.7 10/07/2020 1043   CL 103 10/07/2020 1043   CO2 22 10/07/2020 1043   GLUCOSE 219 (H) 10/07/2020 1043   GLUCOSE 156 (H) 07/22/2020 0320   BUN 10 10/07/2020 1043   CREATININE 0.94 10/07/2020 1043   CREATININE 0.59 02/17/2015 1031   CALCIUM 9.9 10/07/2020 1043   GFRNONAA >60 07/22/2020 0320   GFRAA 89 03/17/2020 1500    Renal function: CrCl cannot be calculated (Patient's most recent lab result is older than the maximum 21 days allowed.).  Clinical ASCVD: Yes  The ASCVD Risk score Mikey Bussing DC Jr., et al., 2013) failed  to calculate for the following reasons:   The patient has a prior MI or stroke diagnosis  A/P: Hypertension longstanding currently at goal on current medications. BP Goal = < 130/80 mmHg. Medication adherence reported. No med changes today.  -Continued current regimen. Advised to take hydralazine TID as prescribed.  -F/u labs ordered - none  -Counseled on lifestyle modifications for blood pressure control including reduced dietary sodium, increased exercise, adequate sleep.  Results reviewed and written information provided.   Total time in face-to-face counseling 30 minutes.   F/U Cardiology Visit in July.    Benard Halsted, PharmD, Para [place], [place] [phone]

## 2020-12-29 NOTE — Telephone Encounter (Signed)
Called and spoke w/pt and got them the snf approved and instructed the pt to complete fasting labs post 4th dose and pt voiced understanding

## 2020-12-29 NOTE — Addendum Note (Signed)
Addended by: Allean Found on: 12/29/2020 04:17 PM   Modules accepted: Orders

## 2020-12-30 ENCOUNTER — Other Ambulatory Visit: Payer: Self-pay

## 2020-12-31 ENCOUNTER — Other Ambulatory Visit: Payer: Self-pay

## 2020-12-31 MED FILL — Glipizide Tab 10 MG: ORAL | 30 days supply | Qty: 60 | Fill #0 | Status: AC

## 2020-12-31 MED FILL — Spironolactone Tab 25 MG: ORAL | 30 days supply | Qty: 60 | Fill #1 | Status: AC

## 2021-01-03 ENCOUNTER — Other Ambulatory Visit: Payer: Self-pay

## 2021-01-12 ENCOUNTER — Other Ambulatory Visit: Payer: Self-pay

## 2021-01-12 MED FILL — Hydralazine HCl Tab 100 MG: ORAL | 30 days supply | Qty: 90 | Fill #1 | Status: AC

## 2021-01-25 ENCOUNTER — Other Ambulatory Visit: Payer: Self-pay

## 2021-01-25 MED FILL — Ticagrelor Tab 60 MG: ORAL | 90 days supply | Qty: 180 | Fill #0 | Status: AC

## 2021-01-25 MED FILL — Gabapentin Cap 300 MG: ORAL | 30 days supply | Qty: 30 | Fill #2 | Status: AC

## 2021-01-25 MED FILL — Pantoprazole Sodium EC Tab 40 MG (Base Equiv): ORAL | 30 days supply | Qty: 30 | Fill #2 | Status: AC

## 2021-01-25 MED FILL — Spironolactone Tab 25 MG: ORAL | 30 days supply | Qty: 60 | Fill #2 | Status: AC

## 2021-01-26 ENCOUNTER — Other Ambulatory Visit: Payer: Self-pay

## 2021-02-08 ENCOUNTER — Telehealth: Payer: Self-pay | Admitting: *Deleted

## 2021-02-08 NOTE — Telephone Encounter (Signed)
Called  spoke to patient .  Rn asked if patient would like to move appointment to earlier that day - had a cancellation.  Patient was in agreement reschedule for 02/14/21 at 11:40 am .  Appointment changed on schedule.

## 2021-02-14 ENCOUNTER — Other Ambulatory Visit: Payer: Self-pay

## 2021-02-14 ENCOUNTER — Encounter: Payer: Self-pay | Admitting: Cardiology

## 2021-02-14 ENCOUNTER — Ambulatory Visit (INDEPENDENT_AMBULATORY_CARE_PROVIDER_SITE_OTHER): Payer: Self-pay | Admitting: Cardiology

## 2021-02-14 ENCOUNTER — Ambulatory Visit: Payer: Self-pay | Admitting: Cardiology

## 2021-02-14 VITALS — BP 116/70 | HR 71 | Resp 18 | Ht 69.0 in | Wt 209.2 lb

## 2021-02-14 DIAGNOSIS — Z955 Presence of coronary angioplasty implant and graft: Secondary | ICD-10-CM

## 2021-02-14 DIAGNOSIS — I110 Hypertensive heart disease with heart failure: Secondary | ICD-10-CM

## 2021-02-14 DIAGNOSIS — I25119 Atherosclerotic heart disease of native coronary artery with unspecified angina pectoris: Secondary | ICD-10-CM

## 2021-02-14 DIAGNOSIS — I255 Ischemic cardiomyopathy: Secondary | ICD-10-CM | POA: Insufficient documentation

## 2021-02-14 DIAGNOSIS — E1169 Type 2 diabetes mellitus with other specified complication: Secondary | ICD-10-CM

## 2021-02-14 DIAGNOSIS — I5042 Chronic combined systolic (congestive) and diastolic (congestive) heart failure: Secondary | ICD-10-CM

## 2021-02-14 DIAGNOSIS — I2102 ST elevation (STEMI) myocardial infarction involving left anterior descending coronary artery: Secondary | ICD-10-CM

## 2021-02-14 DIAGNOSIS — Z720 Tobacco use: Secondary | ICD-10-CM

## 2021-02-14 DIAGNOSIS — E785 Hyperlipidemia, unspecified: Secondary | ICD-10-CM

## 2021-02-14 NOTE — Patient Instructions (Addendum)
Medication Instructions:  No changes   *If you need a refill on your cardiac medications before your next appointment, please call your pharmacy*   Lab Work  CMP LIPID- fasting     Testing/Procedures: Will be schedule at [place] has requested that you have an echocardiogram. Echocardiography is a painless test that uses sound waves to create images of your heart. It provides your doctor with information about the size and shape of your heart and how well your heart's chambers and valves are working. This procedure takes approximately one hour. There are no restrictions for this procedure.    Follow-Up: At [facility], you and your health needs are our priority.  As part of our continuing mission to provide you with exceptional heart care, we have created designated Provider Care Teams.  These Care Teams include your primary Cardiologist (physician) and Advanced Practice Providers (APPs -  Physician Assistants and Nurse Practitioners) who all work together to provide you with the care you need, when you need it.  We recommend signing up for the patient portal called "MyChart".  Sign up information is provided on this After Visit Summary.  MyChart is used to connect with patients for Virtual Visits (Telemedicine).  Patients are able to view lab/test results, encounter notes, upcoming appointments, etc.  Non-urgent messages can be sent to your provider as well.   To learn more about what you can do with MyChart, go to [email or web address].    Your next appointment:   6 month(s)  The format for your next appointment:   In Person  Provider:   You will see one of the following Advanced Practice Providers on your designated Care Team:   Almyra Deforest PA  Then, Glenetta Hew, MD will plan to see you again in 12 month(s).

## 2021-02-14 NOTE — Progress Notes (Signed)
Primary Care Provider: [name], MD Cardiologist: Glenetta Hew, MD Electrophysiologist: None  Clinic Note: Chief Complaint  Patient presents with   Follow-up    3-4 months -> no significant symptoms.  Starting to be more active.    ===================================  ASSESSMENT/PLAN   Problem List Items Addressed This Visit     Hypertensive heart disease with chronic combined systolic and diastolic congestive heart failure (HCC) (Chronic)    Blood pressure actually seems to be pretty well controlled on 25 mg twice daily carvedilol, hydralazine 100 mg 3 times daily, spironolactone 25 mg twice daily and Entresto 97-103 mg twice daily along with low-dose standing furosemide 20 mg daily with PRN dosing based on sliding scale.  No further changes today. ->  For elevated pressures greater than 150 mmHg, would asked that Amy Chaney take an additional hydralazine       Relevant Orders   ECHOCARDIOGRAM COMPLETE   Comprehensive metabolic panel   Lipid panel   Comprehensive metabolic panel   Coronary artery disease involving native coronary artery of native heart with angina pectoris ([place]) (Chronic)    Most recent cardiac catheterization was April 2021.  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  No longer having true anginal symptoms.  Amy Chaney has weird flushing sensations that seem more potentially related to Brilinta. Amy Chaney becomes notably symptomatic as identified by presentation with heart failure if Amy Chaney is volume overloaded.  Plan: Continue carvedilol 25 mg twice daily along with max dose Entresto 97-103 mg twice daily, spironolactone 25 mg twice daily, along with hydralazine 1 mg 3 times daily. On high-dose atorvastatin 80 mg daily with recent start of Repatha.  Have not seen labs followed up yet. Now has extensive stents.  Remains on Brilinta but at reduced dose.  Monitor her dyspnea spells very closely, would have low threshold to converting simply to  clopidogrel for maintenance.      Relevant Orders   EKG 12-Lead (Completed)   ECHOCARDIOGRAM COMPLETE   Lipid panel   Comprehensive metabolic panel   Lipid panel   Comprehensive metabolic panel   Morbid obesity ([place]): BMI ~36 with HTN, HLD, CAD (Chronic)    Have been doing very well with weight loss, staying stable, but plateaued a little bit.  Hopefully Amy Chaney will pick up her level of exercise and adjust diet.         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  Prior to that, inferior STEMI in September 2014.  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  Thankfully, does not seem to be having any more additional anginal symptoms.  Is more active.  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  On relatively stable regimen at this point.       Relevant Orders   EKG 12-Lead (Completed)   ECHOCARDIOGRAM COMPLETE   Lipid panel   Comprehensive metabolic panel   Hyperlipidemia associated with type 2 diabetes mellitus ([place]) (Chronic)    Was just started on Repatha.  In the process of finishing up paperwork with [employer].  Plan is to have lab recheck soon at which point we may decide if Amy Chaney can reduce to 40 mg atorvastatin.  Relevant Orders   Lipid panel   Comprehensive metabolic panel   Lipid panel   Comprehensive metabolic panel   S/P primary angioplasty with coronary stent (Chronic)   Relevant Orders   EKG 12-Lead (Completed)   ECHOCARDIOGRAM COMPLETE   Lipid panel   Comprehensive metabolic panel   Chronic combined systolic (congestive) and diastolic (congestive) heart failure (HCC) - Primary (Chronic)    Seems to be somewhat euvolemic today on exam and blood pressure is very  stable on current meds.  At present, Amy Chaney is not using much in the way of any furosemide, take additional dose maybe 1 or 2 days out of the week.  Continue current dose of carvedilol, Entresto, hydralazine and spironolactone       Relevant Orders   EKG 12-Lead (Completed)   ECHOCARDIOGRAM COMPLETE   Lipid panel   Comprehensive metabolic panel   Lipid panel   Comprehensive metabolic panel   Tobacco abuse (Chronic)    Counseling provided.  Amy Chaney simply needs to make the final "step up. Smoking cessation instruction/counseling given:  counseled patient on the dangers of tobacco use, advised patient to stop smoking, and reviewed strategies to maximize success       Dyslipidemia, goal LDL below 70 (Chronic)   Relevant Orders   Lipid panel   Comprehensive metabolic panel   Lipid panel   Comprehensive metabolic panel   Cardiomyopathy, ischemic (Chronic)    Last recorded EF was about 25%, but Amy Chaney has stabilized dramatically since then.  I do think we need to get a new assessment of her baseline function now that Amy Chaney is on max dose Entresto and carvedilol along with hydralazine and spironolactone.  Our BP is better controlled.,  Standing dose Lasix 20 mg daily and may be twice a week Amy Chaney will take an additional dose, often depends on what Amy Chaney eats.  Now that Amy Chaney is on optimal medical management, we can recheck a 2D echocardiogram to get new baseline.       Relevant Orders   EKG 12-Lead (Completed)   ECHOCARDIOGRAM COMPLETE   Lipid panel   Comprehensive metabolic panel   Lipid panel   Comprehensive metabolic panel    ===================================  HPI:    Amy Chaney is a 50 y.o. female with a PMH notable for  (CAD - (PCI -RCA & LAD - TO Diag), ICM w/ CHRONIC COMBINED CHF (complicated by Takotsubo CM), HTN (with Hypertensive Heart Dz), HLD, DM-2, & Tobacco/Smoker) who presents today for 4 month f/u.  Cardiac History: 02/25/2013 - Progressive angina with abnormal  Myoview:  DES PCI of p-mRCA 2 overlapping DES (Promus 3.0 x 38 & 3.5 x 26 - 3.75-3.6 mm) - Dr. Einar Gip. 04/01/2013 Inf NSTEMI -> (pt had stopped Effient 4 d prior to episode) -  100% subacute stent thrombosis -> additional Promus 3 x 20 stent (distal overlap). 09/2017: Ant STEMI - LAD-D2 bifurcation (lost D2 after DK crush - unable to rewire) - likely SCAD related. 06/2018: Ant STEM: subacute Stent thrombosis - DES PCI- distal overlap. EF 35-40%. Prior Auth for Entresto (Dec 2020) 12/18/2018: Flash Pulm Edema - Acute CHF decompensation, HTN Urgency (SBP 240 mmHg) --> EF 20-25%, Takotsubo CM (related to Son's death). --> finally started Entresto, Carvedilol & Spironolactone. 7/2020Delene Loll increased to 49/51 mg.  Echo 02/14/19: Moderate LVH.  EF improved up to 45-50%.  GR 1 DD.  Severe apical akinesis.  Normal valves. 4/2-12/2019:  Admit - Acute Hypoxic/ Hypercarbic RF/ Flash Pulm Edema --> unresponsive.  Preceded  by L -sided CP (left ER 2/2 wait time) - returned several days later via EMS.  SPO2 90%, BP 190/83.  CXR with bilateral pulmonary edema.  ABG with significant hypercarbic respiratory acidosis => Intubated.  Diuresed. Echo EF 20-25% --> ? Related To Hypertensive Urgency R&LHC -stable with no new disease. (Known D2 CTO (jailed), patent LAD &RCA stents, 50% OM2, 40% m-d RCA ?? medication adherence July 18/2021: ER A on C CHF (orthopnea/ PND & cough, edema) - had stopped taking lasix 3 weeks prior to this. -- Echo recheck deferred @ f/u 2/2 $$.   Seen by me on June 01, 2020 -> BP ? Stable, Added Hydarlazine 50 mg BID & recommended use of PRN additional Hydralazine for SBP > 150 mmHg; on Farxiga along with Entresto & Carvedilol. Maintenance dose Brilinta. Recommended continued Rx, but with sliding scale furosemide.   Amy Chaney was last seen on October 15, 2020 by Almyra Deforest, PA-C: Denied CP or worsening SOB, PND or orthopnea. - no new Cardiac Sx - BP still high.  Was euvolemic on exam. noted  that Amy Chaney was taking Hydralazine 50 mg TID --> increased to 100 mg TID.  Amy Chaney is in also seen by [facility] pharmacist Harrington Challenger, RPH-CCP on April 14 to initiate lipid management.  LDL as of March 2022 was 111 on atorvastatin 80 mg daily.   Was felt to be a great candidate for PCSK9 inhibitor with exception of the fact that [employer] does not have a prescription medication coverage plan. Plan was to apply for Repatha SureClick 150 mg every 14 days through [employer] -> intention was to recheck lipids after 4 doses. Wilder Glade was stopped due to UTI and kidney polyps.  Recent Hospitalizations: None in the last 6 months.  Reviewed  CV studies:    The following studies were reviewed today: (if available, images/films reviewed: From Epic Chart or Care Everywhere) N/A:   Interval History:   AIDYNN POLENDO returns here today for follow-up stating that Amy Chaney is doing pretty well.  Amy Chaney says Amy Chaney walks a lot at work and also tries to do walking around the house.  Amy Chaney is does not do very well during the heat of the day.  Amy Chaney denies any routine chest tightness or pressure with rest or exertion, but does have occasional episodes where Amy Chaney feels a hot flushing sensation in the chest as it is difficult to catch her breath.  This can happen maybe 2 or 3 times a week.  Amy Chaney has no associated PND or orthopnea and trivial edema.  Amy Chaney says that quite often her legs feel sore like jelly when Amy Chaney walks longer term.  However Amy Chaney denies any exertional chest tightness or pressure.  Amy Chaney is occasionally gets lightheaded and dizzy but no near syncope or syncope.  CV Review of Symptoms (Summary) Cardiovascular ROS: no chest pain or dyspnea on exertion positive for - irregular heartbeat, shortness of breath, and shortness of breath with significant exertion but also intermittent brief bursts.  Legs feel weak, no claudication.  Occasional lightheadedness/dizziness without near syncope. negative for - loss of  consciousness, orthopnea, paroxysmal nocturnal dyspnea, rapid heart rate, or TIA/amaurosis fugax, claudication  REVIEWED OF SYSTEMS   Review of Systems  Constitutional:  Positive for weight loss (Maybe a little bit). Negative for malaise/fatigue (Sometimes at the end of the day Amy Chaney gets tired easier than Amy Chaney used to.).  HENT:  Negative for congestion and nosebleeds.   Respiratory:  Positive for shortness of breath (  At baseline). Negative for cough and wheezing.   Cardiovascular:  Positive for leg swelling (Trivial-1+).  Gastrointestinal:  Negative for blood in stool and melena.  Genitourinary:  Negative for hematuria.  Musculoskeletal:  Positive for myalgias. Negative for falls and joint pain.  Neurological:  Positive for dizziness (No further dizziness over the last couple months.). Negative for focal weakness and weakness.  Psychiatric/Behavioral:  Negative for depression and memory loss. The patient is not nervous/anxious and does not have insomnia.    I have reviewed and (if needed) personally updated the patient's problem list, medications, allergies, past medical and surgical history, social and family history.   PAST MEDICAL HISTORY   Past Medical History:  Diagnosis Date   Coronary artery disease involving native coronary artery of native heart with angina pectoris ([place]) 01/2013   a. s/p PCI of the mid RCA 01/2013 //  b. LHC 9/14: EF 55%, RCA stent 100% occluded, LAD irregularities >>  subacute stent thrombosis, Promus DES PCI distal overlap // c) 09/2017 - Ant STEMI - LAD-D2 PCI - Successful PCI of LAD (3 overlapping DES), unable to restore flow down D2l that was stented as well.  Likely related to downstream dissection, but unable to rewire.   Daily headache    Depression    Dyslipidemia, goal LDL below 70    History of Doppler ultrasound    carotid bruit >> a. Carotid US 6/17: bilat ICA 1-39%   History of Takotsubo cardiomyopathy 11/2018   Admitted for acute on chronic combined  systolic and diastolic heart failure.  EF down to 20-25% global hypokinesis.  Was in the setting of grieving over the loss of her son from MI.  ->  EF improved to 45-50% on follow-up echocardiogram.   Hypertension    Ischemic cardiomyopathy 06/2018   s/p inferior STEMI followed by 2 anterior STEMIs: Following recovery from Takotsubo Cardiomyopathy- Echo 02/14/19: Moderate LVH.  EF improved up to 45-50%.  GR 1 DD.  Severe apical akinesis.  Normal valves.   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 b) distal mLAD stent Edge 100% thrombosis --> PTCA & Overlapping DES PCI (Synergy 3 x 12 --> 3.6-3.3 mm). D2 occluded. RCA stents patent, 65 % OM2. EF 30-35%.   STEMI involving oth coronary artery of inferior wall ([place]) 03/2013   Secondary to subacute stent thrombosis of RCA stent segment having missed 4 doses of Effient.   Tobacco abuse 01/11/2016   Type II diabetes mellitus ([place]) 12/2010   sister and son also diabetic     PAST SURGICAL HISTORY   Past Surgical History:  Procedure Laterality Date   CHOLECYSTECTOMY  ~ 2000   CORONARY STENT INTERVENTION N/A 10/10/2017   Procedure: CORONARY STENT INTERVENTION;  Surgeon: Leonie Man, MD;  Location: [place] [facility];  Service: Cardiovascular; LAD PCI: STENT SYNERGY DES 3.5X32 (p),STENT SYNERGY DES 3.5X 8 (m), STENT SYNERGY DES 3.5X28 (d).  D2 PCI: STENT SYNERGY DES 2.5X24.  (UNABL TO REWIRE & EXPAND - TO OF DIAG AT END OF CASE)   CORONARY STENT INTERVENTION N/A 07/03/2018   Procedure: CORONARY STENT INTERVENTION;  Surgeon: Leonie Man, MD;  Location: [facility] INVASIVE [facility] LAB;; PTCA &  overlapping DES PCI (overlaps prior stents distally) - Synergy DES 3 x 12 (3.6 mm @ overlap -- 3.3 mm distally)>   LEFT HEART CATH AND CORONARY ANGIOGRAPHY N/A 10/10/2017   Procedure: LEFT HEART CATH AND CORONARY ANGIOGRAPHY;  Surgeon: Leonie Man, MD;  Location: [place] [facility];  Service: Cardiovascular;  Laterality: N/A; -patent RCA stents with mild in-stent restenosis. [place] 1,1,1 mLD-D2 16% (complicated by dissection/intramural thrombus)   LEFT HEART CATH AND CORONARY ANGIOGRAPHY N/A 07/03/2018   Procedure: LEFT HEART CATH AND CORONARY ANGIOGRAPHY;  Surgeon: Leonie Man, MD;  Location: [place] [facility];; 100% thrombotic occlusion distal LAD stent -- DES PCI. continued 100% D2 (previously lost). patent RCA stents, 65% OM2.  EF 30-35%.   LEFT HEART CATHETERIZATION WITH CORONARY ANGIOGRAM N/A 02/25/2013   Procedure: LEFT HEART CATHETERIZATION WITH CORONARY ANGIOGRAM;  Surgeon: Laverda Page, MD;  Location: [facility] CATH LAB;  Service: Cardiovascular;; CTO m RCA; otherwise normal coronaries   LEFT HEART CATHETERIZATION WITH CORONARY ANGIOGRAM N/A 04/01/2013   Procedure: LEFT HEART CATHETERIZATION WITH CORONARY ANGIOGRAM;  Surgeon: Laverda Page, MD;  Location: [facility] CATH LAB;  Service: Cardiovascular: 100% occlusion of distal RCA stent (subacute stent thrombosis -  secondary to stopping Effient).  Overlapping DES PCI   NM MYOVIEW LTD  12/2015    EF 38%.  Hypertensive response to exercise (231/132 mmHg).  INTERMEDIATE RISK due to -diffuse hypokinesis and reduced EF.  No ischemia or infarction.   PERCUTANEOUS CORONARY STENT INTERVENTION (PCI-S)  04/01/2013   Procedure: PERCUTANEOUS CORONARY STENT INTERVENTION (PCI-S);  Surgeon: Laverda Page, MD;  Location: [facility] CATH LAB;  Service: Cardiovascular;; PCI of distal RCA stent subacute thrombosis: Promus Premier DES 3.0 mm x 20 mm.   PERCUTANEOUS CORONARY STENT INTERVENTION (PCI-S)  02/25/2013   Procedure: PERCUTANEOUS CORONARY STENT INTERVENTION  (PCI-S);  Surgeon: Laverda Page, MD;  Location: [facility] CATH LAB;  RCA CTO PCI with overlapping Promus DES: 3.0 mm x 38 mm, 3.5 mm x 44m   RIGHT/LEFT HEART CATH AND CORONARY ANGIOGRAPHY N/A 11/04/2019   Procedure: RIGHT/LEFT HEART CATH AND CORONARY ANGIOGRAPHY;  Surgeon: HLeonie Man MD;  [facility];; Mildly elevated LVEDP.  Ostial D1 100%.  Proximal to mid LAD stent 10% ISR.  Mid and distal stent widely patent.  30% distal distance.  Ostial OM 2 55%.  Proximal to mid RCA stent 50% stenosis.  Mid to distal RCA 40%.  Suspect that reduced EF is related to hypertensive emergen   TRANSTHORACIC ECHOCARDIOGRAM  07/04/2018   recurrent Anterior STEMI: EF 35-40%.  Apical hypokinesis.  GRII DD.  No thrombus noted.  (Improved EF from 30% up to 35-40% from previous echo)   TRANSTHORACIC ECHOCARDIOGRAM  11/2018; 01/2019   a) TAKOTUSBO CM: EF 20-25%-diffuse HK..  Mod V thickness.  GRII DD w/ high LVEDP.  Severe LA dilation;; b) 02/14/19: Moderate LVH.  EF improved up to 45-50%.  GR 1 DD.  Severe apical akinesis.  Normal valves.   TRANSTHORACIC ECHOCARDIOGRAM  10/31/2019   Hypertensive emergency, hypoxic/hypercapnic respiratory failure;  EF 20 to 25%.  Global HK.  Mildly dilated LV.  Mildly elevated PA pressures.  Mild LA dilation.  Mild AR.  Mildly elevated RAP-8 mmHg.=> Eventual cath showed no change   TUBAL LIGATION  1994   Right and Left Heart Cath 11/04/2019: Mildly elevated LVEDP.  Ostial D1 100%.  Proximal to mid LAD  stent 10% ISR.  Mid and distal stent widely patent.  30% distal distance.  Ostial OM 2 55%.  Proximal to mid RCA stent 50% stenosis.  Mid to distal RCA 40%.  Suspect that reduced EF is related to hypertensive emergency.:   Immunization History  Administered Date(s) Administered   Pneumococcal Polysaccharide-23 02/26/2013   Tdap 08/21/2018    MEDICATIONS/ALLERGIES   Current Meds  Medication Sig   blood glucose meter kit and supplies Dispense based on patient and insurance  preference. Use up to four times daily as directed. (FOR ICD-10 E10.9, E11.9).   Blood Glucose Monitoring Suppl (TRUE METRIX METER) DEVI 1 kit by Does not apply route 4 (four) times daily.   furosemide (LASIX) 20 MG tablet TAKE 1 TABLET BY MOUTH DAILY. MAY TAKE ADDITIONAL TABLET 1 TO 2 TABLETS IF WEIGHT IS MORE THAN 3 TO 51BS. (Patient taking differently: Take 20 mg by mouth 2 (two) times daily.)   gabapentin (NEURONTIN) 300 MG capsule TAKE 1 CAPSULE (300 MG TOTAL) BY MOUTH AT BEDTIME.   glipiZIDE (GLUCOTROL) 10 MG tablet TAKE 1 TABLET (10 MG TOTAL) BY MOUTH 2 (TWO) TIMES DAILY BEFORE A MEAL.   glucose blood (TRUE METRIX BLOOD GLUCOSE TEST) test strip USE AS INSTRUCTED   hydrALAZINE (APRESOLINE) 100 MG tablet TAKE 1 TABLET (100 MG TOTAL) BY MOUTH 3 (THREE) TIMES DAILY.   Insulin Glargine (BASAGLAR KWIKPEN) 100 UNIT/ML INJECT 38 UNITS INTO THE SKIN DAILY.   naproxen sodium (ALEVE) 220 MG tablet Take 220 mg by mouth daily as needed (pain).   nitroGLYCERIN (NITROSTAT) 0.4 MG SL tablet Place 1 tablet (0.4 mg total) under the tongue every 5 (five) minutes as needed for chest pain. Reported on 11/25/2015 (Patient taking differently: Place 0.4 mg under the tongue every 5 (five) minutes as needed for chest pain (max 3 doses).)   pantoprazole (PROTONIX) 40 MG tablet TAKE 1 TABLET (40 MG TOTAL) BY MOUTH DAILY.   sacubitril-valsartan (ENTRESTO) 97-103 MG Take 1 tablet by mouth 2 (two) times daily.   spironolactone (ALDACTONE) 25 MG tablet TAKE 1 TABLET (25 MG TOTAL) BY MOUTH 2 (TWO) TIMES DAILY.   ticagrelor (BRILINTA) 60 MG TABS tablet TAKE 1 TABLET (60 MG TOTAL) BY MOUTH 2 (TWO) TIMES DAILY.   TRUEPLUS LANCETS 28G MISC 28 g by Does not apply route 4 (four) times daily.    Allergies  Allergen Reactions   Farxiga [Dapagliflozin] Other (See Comments)    Caused UTI and polyps on kidneys     SOCIAL HISTORY/FAMILY HISTORY   Reviewed in Epic:  Pertinent findings:  Social History   Tobacco Use   Smoking  status: Every Day    Packs/day: 0.25    Years: 22.00    Pack years: 5.50    Types: Cigarettes   Smokeless tobacco: Never   Tobacco comments:    current smoker 4-5 cigarettes per day; previously smoked at least a pack a day.  Vaping Use   Vaping Use: Never used  Substance Use Topics   Alcohol use: Yes    Alcohol/week: 0.0 standard drinks    Comment: 07/04/2018 "only on special occasions; maybe once/yr"   Drug use: Yes    Types: Marijuana    Comment: 02/25/2013 "quit marijuana ~ 2001"   Social History   Social History Narrative   Diet: 50/50 take out and home cook meals (eats burgers and lots of fried foods), limits portions.       Exercise: walks 15-20 minutes twice per week  OBJCTIVE -PE, EKG, labs   Wt Readings from Last 3 Encounters:  02/14/21 209 lb 3.2 oz (94.9 kg)  11/11/20 211 lb 9.6 oz (96 kg)  10/19/20 213 lb 6.4 oz (96.8 kg)    Physical Exam: BP 116/70   Pulse 71   Resp 18   Ht 5' 9"  (1.753 m)   Wt 209 lb 3.2 oz (94.9 kg)   LMP 10/20/2015   SpO2 100%   BMI 30.89 kg/m  Physical Exam Vitals reviewed.  Constitutional:      General: Amy Chaney is not in acute distress.    Appearance: Normal appearance. Amy Chaney is obese. Amy Chaney is ill-appearing (Mild chronically ill-appearing). Amy Chaney is not toxic-appearing.     Comments: Well-groomed  HENT:     Head: Normocephalic and atraumatic.  Neck:     Vascular: Hepatojugular reflux (Trivial) present. No carotid bruit or JVD.  Cardiovascular:     Rate and Rhythm: Normal rate and regular rhythm.     Pulses: Decreased pulses (Mildly decreased pedal pulses are palpable.).     Heart sounds: S1 normal and S2 normal. Heart sounds are distant. No murmur heard.   Gallop present. S4 sounds present.  Musculoskeletal:     Cervical back: Normal range of motion and neck supple.  Neurological:     Mental Status: Amy Chaney is alert.    Adult ECG Report  Rate: 71 ;  Rhythm: normal sinus rhythm and LVH with repolarization abnormalities. ;    Narrative Interpretation: Stable EKG.  Recent Labs: Reviewed Lab Results  Component Value Date   CHOL 171 10/07/2020   HDL 41 10/07/2020   LDLCALC 111 (H) 10/07/2020   TRIG 103 10/07/2020   CHOLHDL 4.2 10/07/2020   Lab Results  Component Value Date   CREATININE 0.94 10/07/2020   BUN 10 10/07/2020   NA 142 10/07/2020   K 3.7 10/07/2020   CL 103 10/07/2020   CO2 22 10/07/2020   CBC Latest Ref Rng & Units 10/07/2020 07/22/2020 07/21/2020  WBC 3.4 - 10.8 x10E3/uL 11.4(H) 9.8 9.6  Hemoglobin 11.1 - 15.9 g/dL 12.7 9.4(L) 10.6(L)  Hematocrit 34.0 - 46.6 % 39.3 28.1(L) 34.7(L)  Platelets 150 - 450 x10E3/uL 409 327 322    Lab Results  Component Value Date   TSH 1.904 10/31/2019   Lab Results  Component Value Date   HGBA1C 8.2 (A) 10/19/2020    ==================================================  COVID-19 Education: The signs and symptoms of COVID-19 were discussed with the patient and how to seek care for testing (follow up with PCP or arrange E-visit).    I spent a total of 61mnutes with the patient spent in direct patient consultation.  Additional time spent with chart review  / charting (studies, outside notes, etc): 25 min Total Time: 56 min  Current medicines are reviewed at length with the patient today.  (+/- concerns) please restart Repatha  This visit occurred during the SARS-CoV-2 public health emergency.  Safety protocols were in place, including screening questions prior to the visit, additional usage of staff PPE, and extensive cleaning of exam room while observing appropriate contact time as indicated for disinfecting solutions.  Notice: This dictation was prepared with Dragon dictation along with smaller phrase technology. Any transcriptional errors that result from this process are unintentional and may not be corrected upon review.  Patient Instructions / Medication Changes & Studies & Tests Ordered   Patient Instructions  Medication Instructions:  No  changes   *If you need a refill on your cardiac medications before  your next appointment, please call your pharmacy*   Lab Work  CMP LIPID- fasting     Testing/Procedures: Will be schedule at [place] has requested that you have an echocardiogram. Echocardiography is a painless test that uses sound waves to create images of your heart. It provides your doctor with information about the size and shape of your heart and how well your heart's chambers and valves are working. This procedure takes approximately one hour. There are no restrictions for this procedure.    Follow-Up: At [facility], you and your health needs are our priority.  As part of our continuing mission to provide you with exceptional heart care, we have created designated Provider Care Teams.  These Care Teams include your primary Cardiologist (physician) and Advanced Practice Providers (APPs -  Physician Assistants and Nurse Practitioners) who all work together to provide you with the care you need, when you need it.  We recommend signing up for the patient portal called "MyChart".  Sign up information is provided on this After Visit Summary.  MyChart is used to connect with patients for Virtual Visits (Telemedicine).  Patients are able to view lab/test results, encounter notes, upcoming appointments, etc.  Non-urgent messages can be sent to your provider as well.   To learn more about what you can do with MyChart, go to [email or web address].    Your next appointment:   6 month(s)  The format for your next appointment:   In Person  Provider:   You will see one of the following Advanced Practice Providers on your designated Care Team:   Almyra Deforest PA  Then, Glenetta Hew, MD will plan to see you again in 12 month(s).        Studies Ordered:   Orders Placed This Encounter  Procedures   Lipid panel   Comprehensive metabolic panel   Lipid panel   Comprehensive  metabolic panel   EKG 89-HTDS   ECHOCARDIOGRAM COMPLETE     Glenetta Hew, M.D., M.S. Interventional Cardiologist   Pager # [phone] Phone # [phone] [address] [place], [place] [address]   Thank you for choosing Heartcare at [facility]!!

## 2021-02-21 ENCOUNTER — Other Ambulatory Visit: Payer: Self-pay

## 2021-02-21 MED FILL — Hydralazine HCl Tab 100 MG: ORAL | 30 days supply | Qty: 90 | Fill #2 | Status: AC

## 2021-02-21 MED FILL — Gabapentin Cap 300 MG: ORAL | 30 days supply | Qty: 30 | Fill #3 | Status: AC

## 2021-02-21 MED FILL — Pantoprazole Sodium EC Tab 40 MG (Base Equiv): ORAL | 30 days supply | Qty: 30 | Fill #3 | Status: AC

## 2021-02-22 ENCOUNTER — Encounter: Payer: Self-pay | Admitting: Cardiology

## 2021-02-22 NOTE — Assessment & Plan Note (Signed)
Counseling provided.  She simply needs to make the final "step up. Smoking cessation instruction/counseling given:  counseled patient on the dangers of tobacco use, advised patient to stop smoking, and reviewed strategies to maximize success

## 2021-02-22 NOTE — Assessment & Plan Note (Deleted)
Was just started on Repatha.  In the process of finishing up paperwork with [employer].  Plan is to have lab recheck soon at which point we may decide if she can reduce to 40 mg atorvastatin.

## 2021-02-22 NOTE — Assessment & Plan Note (Signed)
Seems to be somewhat euvolemic today on exam and blood pressure is very stable on current meds.  At present, she is not using much in the way of any furosemide, take additional dose maybe 1 or 2 days out of the week.  Continue current dose of carvedilol, Entresto, hydralazine and spironolactone

## 2021-02-22 NOTE — Assessment & Plan Note (Addendum)
Last recorded EF was about 25%, but she has stabilized dramatically since then.  I do think we need to get a new assessment of her baseline function now that she is on max dose Entresto and carvedilol along with hydralazine and spironolactone.  Our BP is better controlled.,  Standing dose Lasix 20 mg daily and may be twice a week she will take an additional dose, often depends on what she eats.  Now that she is on optimal medical management, we can recheck a 2D echocardiogram to get new baseline.

## 2021-02-22 NOTE — Assessment & Plan Note (Signed)
Was just started on Repatha.  In the process of finishing up paperwork with [employer].  Plan is to have lab recheck soon at which point we may decide if she can reduce to 40 mg atorvastatin.

## 2021-02-22 NOTE — Assessment & Plan Note (Signed)
Blood pressure actually seems to be pretty well controlled on 25 mg twice daily carvedilol, hydralazine 100 mg 3 times daily, spironolactone 25 mg twice daily and Entresto 97-103 mg twice daily along with low-dose standing furosemide 20 mg daily with PRN dosing based on sliding scale.  No further changes today. ->  For elevated pressures greater than 150 mmHg, would asked that she take an additional hydralazine

## 2021-02-22 NOTE — Assessment & Plan Note (Signed)
Most recent cardiac catheterization was April 2021.  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  No longer having true anginal symptoms.  She has weird flushing sensations that seem more potentially related to Brilinta. She becomes notably symptomatic as identified by presentation with heart failure if she is volume overloaded.  Plan:  Continue carvedilol 25 mg twice daily along with max dose Entresto 97-103 mg twice daily, spironolactone 25 mg twice daily, along with hydralazine 1 mg 3 times daily.  On high-dose atorvastatin 80 mg daily with recent start of Repatha.  Have not seen labs followed up yet.  Now has extensive stents.  Remains on Brilinta but at reduced dose.  Monitor her dyspnea spells very closely, would have low threshold to converting simply to clopidogrel for maintenance.

## 2021-02-22 NOTE — Assessment & Plan Note (Signed)
Have been doing very well with weight loss, staying stable, but plateaued a little bit.  Hopefully she will pick up her level of exercise and adjust diet.

## 2021-02-22 NOTE — Assessment & Plan Note (Signed)
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  Prior to that, inferior STEMI in September 2014.  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  Thankfully, does not seem to be having any more additional anginal symptoms.  Is more active.  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  On relatively stable regimen at this point.

## 2021-03-03 ENCOUNTER — Other Ambulatory Visit: Payer: Self-pay

## 2021-03-03 MED FILL — Spironolactone Tab 25 MG: ORAL | 30 days supply | Qty: 60 | Fill #3 | Status: AC

## 2021-03-10 ENCOUNTER — Other Ambulatory Visit: Payer: Medicaid Other | Admitting: *Deleted

## 2021-03-10 ENCOUNTER — Other Ambulatory Visit: Payer: Self-pay | Admitting: *Deleted

## 2021-03-10 ENCOUNTER — Ambulatory Visit (HOSPITAL_COMMUNITY): Payer: Self-pay | Attending: Cardiology

## 2021-03-10 ENCOUNTER — Other Ambulatory Visit: Payer: Self-pay

## 2021-03-10 DIAGNOSIS — I252 Old myocardial infarction: Secondary | ICD-10-CM

## 2021-03-10 DIAGNOSIS — E1165 Type 2 diabetes mellitus with hyperglycemia: Secondary | ICD-10-CM

## 2021-03-10 DIAGNOSIS — E119 Type 2 diabetes mellitus without complications: Secondary | ICD-10-CM

## 2021-03-10 DIAGNOSIS — I25119 Atherosclerotic heart disease of native coronary artery with unspecified angina pectoris: Secondary | ICD-10-CM

## 2021-03-10 DIAGNOSIS — I110 Hypertensive heart disease with heart failure: Secondary | ICD-10-CM

## 2021-03-10 DIAGNOSIS — I2510 Atherosclerotic heart disease of native coronary artery without angina pectoris: Secondary | ICD-10-CM

## 2021-03-10 DIAGNOSIS — I5042 Chronic combined systolic (congestive) and diastolic (congestive) heart failure: Secondary | ICD-10-CM

## 2021-03-10 DIAGNOSIS — I2102 ST elevation (STEMI) myocardial infarction involving left anterior descending coronary artery: Secondary | ICD-10-CM

## 2021-03-10 DIAGNOSIS — I255 Ischemic cardiomyopathy: Secondary | ICD-10-CM

## 2021-03-10 DIAGNOSIS — Z955 Presence of coronary angioplasty implant and graft: Secondary | ICD-10-CM

## 2021-03-10 DIAGNOSIS — E1169 Type 2 diabetes mellitus with other specified complication: Secondary | ICD-10-CM

## 2021-03-10 DIAGNOSIS — E785 Hyperlipidemia, unspecified: Secondary | ICD-10-CM

## 2021-03-10 LAB — COMPREHENSIVE METABOLIC PANEL
ALT: 9 IU/L (ref 0–32)
AST: 11 IU/L (ref 0–40)
Albumin/Globulin Ratio: 1.2 (ref 1.2–2.2)
Albumin: 3.7 g/dL — ABNORMAL LOW (ref 3.8–4.9)
Alkaline Phosphatase: 51 IU/L (ref 44–121)
BUN/Creatinine Ratio: 14 (ref 9–23)
BUN: 12 mg/dL (ref 6–24)
Bilirubin Total: 0.4 mg/dL (ref 0.0–1.2)
CO2: 23 mmol/L (ref 20–29)
Calcium: 8.7 mg/dL (ref 8.7–10.2)
Chloride: 103 mmol/L (ref 96–106)
Creatinine, Ser: 0.86 mg/dL (ref 0.57–1.00)
Globulin, Total: 3 g/dL (ref 1.5–4.5)
Glucose: 230 mg/dL — ABNORMAL HIGH (ref 65–99)
Potassium: 4.1 mmol/L (ref 3.5–5.2)
Sodium: 139 mmol/L (ref 134–144)
Total Protein: 6.7 g/dL (ref 6.0–8.5)
eGFR: 82 mL/min/{1.73_m2} (ref >59)

## 2021-03-10 LAB — ECHOCARDIOGRAM COMPLETE
Area-P 1/2: 2.67 cm2
S' Lateral: 2.8 cm

## 2021-03-10 LAB — LIPID PANEL
Chol/HDL Ratio: 4.3 ratio (ref 0.0–4.4)
Cholesterol, Total: 182 mg/dL (ref 100–199)
HDL: 42 mg/dL (ref >39)
LDL Chol Calc (NIH): 124 mg/dL — ABNORMAL HIGH (ref 0–99)
Triglycerides: 87 mg/dL (ref 0–149)
VLDL Cholesterol Cal: 16 mg/dL (ref 5–40)

## 2021-03-10 NOTE — Progress Notes (Signed)
Pt is a Dr. Ellyn Hack pt. Pt came to [facility] office to have her echo done today.  She also stopped by our lab to have lipids and cmet done, as Dr. Ellyn Hack ordered on the pt from last OV.  Lab tech came to triage requesting that we place lab order for cmet and lipids in the system so he can draw on the pt.  Pt is here now.  Labs were previously placed as lab collect and assuming she was to go to [facility] office to have done, but came to ours instead, probably for convenience while being here for echo.  Changed the pts lab orders for her to have a lipid and cmet done as clinic collect, by our office now.  Orders still placed under Dr. Ellyn Hack.

## 2021-03-15 ENCOUNTER — Other Ambulatory Visit: Payer: Self-pay

## 2021-03-15 DIAGNOSIS — I25119 Atherosclerotic heart disease of native coronary artery with unspecified angina pectoris: Secondary | ICD-10-CM

## 2021-03-15 DIAGNOSIS — E785 Hyperlipidemia, unspecified: Secondary | ICD-10-CM

## 2021-03-15 MED ORDER — ROSUVASTATIN CALCIUM 40 MG PO TABS
40.0000 mg | ORAL_TABLET | Freq: Every day | ORAL | 3 refills | Status: DC
  Filled 2021-03-15: qty 30, 30d supply, fill #0
  Filled 2021-04-27: qty 30, 30d supply, fill #1
  Filled 2021-05-31: qty 30, 30d supply, fill #2
  Filled 2021-07-08: qty 30, 30d supply, fill #3
  Filled 2021-08-09: qty 30, 30d supply, fill #4
  Filled 2021-08-09: qty 30, 30d supply, fill #0
  Filled 2021-09-12: qty 30, 30d supply, fill #1
  Filled 2021-10-14: qty 30, 30d supply, fill #2

## 2021-03-16 ENCOUNTER — Telehealth: Payer: Self-pay

## 2021-03-16 ENCOUNTER — Other Ambulatory Visit: Payer: Self-pay

## 2021-03-16 NOTE — Telephone Encounter (Signed)
Called and spoke w/pt and instructed them to contact the [employer] as they have been getting it free from the manufacturer. Call [phone]. The pt voiced understanding and stated that she would call them asap

## 2021-03-16 NOTE — Telephone Encounter (Signed)
-----   Message from Leeroy Bock, [place] sent at 03/16/2021  9:04 AM EDT -----  ----- Message ----- From: Luanna Salk, LPN Sent: [phone]   5:45 PM EDT To: [facility]  Patient needs a refill for Repatha     Thanks

## 2021-03-31 ENCOUNTER — Other Ambulatory Visit: Payer: Self-pay | Admitting: Family Medicine

## 2021-03-31 ENCOUNTER — Other Ambulatory Visit: Payer: Self-pay

## 2021-03-31 DIAGNOSIS — I110 Hypertensive heart disease with heart failure: Secondary | ICD-10-CM

## 2021-03-31 MED ORDER — HYDRALAZINE HCL 100 MG PO TABS
ORAL_TABLET | Freq: Three times a day (TID) | ORAL | 2 refills | Status: DC
  Filled 2021-03-31: qty 90, 30d supply, fill #0
  Filled 2021-05-13: qty 90, 30d supply, fill #1
  Filled 2021-05-20: qty 75, 25d supply, fill #1
  Filled 2021-05-20: qty 15, 5d supply, fill #1
  Filled 2021-05-31: qty 90, 30d supply, fill #2
  Filled 2021-07-08: qty 75, 25d supply, fill #3

## 2021-03-31 MED ORDER — BASAGLAR KWIKPEN 100 UNIT/ML ~~LOC~~ SOPN
PEN_INJECTOR | SUBCUTANEOUS | 0 refills | Status: DC
  Filled 2021-03-31: qty 30, 79d supply, fill #0

## 2021-03-31 MED FILL — Spironolactone Tab 25 MG: ORAL | 30 days supply | Qty: 60 | Fill #4 | Status: AC

## 2021-03-31 MED FILL — Pantoprazole Sodium EC Tab 40 MG (Base Equiv): ORAL | 30 days supply | Qty: 30 | Fill #4 | Status: AC

## 2021-03-31 MED FILL — Gabapentin Cap 300 MG: ORAL | 30 days supply | Qty: 30 | Fill #4 | Status: AC

## 2021-03-31 MED FILL — Glipizide Tab 10 MG: ORAL | 30 days supply | Qty: 60 | Fill #1 | Status: AC

## 2021-04-01 ENCOUNTER — Other Ambulatory Visit: Payer: Self-pay

## 2021-04-05 ENCOUNTER — Ambulatory Visit: Payer: Self-pay

## 2021-04-05 ENCOUNTER — Encounter: Payer: Self-pay | Admitting: Nurse Practitioner

## 2021-04-05 ENCOUNTER — Other Ambulatory Visit: Payer: Self-pay

## 2021-04-05 ENCOUNTER — Telehealth (INDEPENDENT_AMBULATORY_CARE_PROVIDER_SITE_OTHER): Payer: Self-pay | Admitting: Nurse Practitioner

## 2021-04-05 DIAGNOSIS — R42 Dizziness and giddiness: Secondary | ICD-10-CM

## 2021-04-05 NOTE — Telephone Encounter (Signed)
Noted  

## 2021-04-05 NOTE — Progress Notes (Signed)
Virtual Visit via Telephone Note  I connected with Amy Chaney on 04/05/21 at  1:50 PM EDT by telephone and verified that I am speaking with the correct person using two identifiers.  Location: Patient: home Provider: office   I discussed the limitations, risks, security and privacy concerns of performing an evaluation and management service by telephone and the availability of in person appointments. I also discussed with the patient that there may be a patient responsible charge related to this service. The patient expressed understanding and agreed to proceed.   History of Present Illness:  Patient presents today for acute visit through televisit.  Patient states that over the last 3 months she has been feeling lightheaded at times.  She states that she is not checking blood pressures at home or her blood sugars.  Patient is diabetic.  Patient is on multiple hypertension medications.  Patient did recently have a full cardiac work-up with cardiology and states that her echo was normal.  Patient states that she is eating and drinking well.  She does not feel that she is dehydrated.  We discussed that this is hard to assess over a telephone visit.  I will order lab work and get her scheduled for an in person visit with her PCP. Denies f/c/s, n/v/d, hemoptysis, PND, chest pain or edema.     Observations/Objective:  Vitals with BMI 02/14/2021 12/22/2020 11/22/2020  Height '5\' 9"'$  - -  Weight 209 lbs 3 oz - -  BMI A999333 - -  Systolic 99991111 [id number] A999333  Diastolic 70 72 72  Pulse 71 - -      Assessment and Plan:  Intermittent lightheadedness:  Get up and down slowly  Stay well hydrated  Heart healthy diabetic diet  Keep close check on blood sugars - keep log  Keep close check on BP - keep log  Will order labs   Follow up:  Follow up with PCP in 2 weeks or sooner if needed     I discussed the assessment and treatment plan with the patient. The patient was provided an  opportunity to ask questions and all were answered. The patient agreed with the plan and demonstrated an understanding of the instructions.   The patient was advised to call back or seek an in-person evaluation if the symptoms worsen or if the condition fails to improve as anticipated.  I provided 23 minutes of non-face-to-face time during this encounter.   Fenton Foy, NP

## 2021-04-05 NOTE — Telephone Encounter (Signed)
Pt. Reports dizziness x 2-3 months. Happens while she is walking around. Has to sit down and rest. Has weakness at times with this. Denies any other symptoms. Virtual visit made for today per Danae Chen in the practice.

## 2021-04-05 NOTE — Patient Instructions (Addendum)
Intermittent lightheadedness:  Get up and down slowly  Stay well hydrated  Heart healthy diabetic diet  Keep close check on blood sugars - keep log  Keep close check on BP - keep log  Will order labs   Follow up:  Follow up with PCP in 2 weeks or sooner if needed

## 2021-04-05 NOTE — Telephone Encounter (Signed)
Reason for Disposition  [1] MODERATE dizziness (e.g., interferes with normal activities) AND [2] has NOT been evaluated by physician for this  (Exception: dizziness caused by heat exposure, sudden standing, or poor fluid intake)  Answer Assessment - Initial Assessment Questions 1. DESCRIPTION: "Describe your dizziness."     Dizzy 2. LIGHTHEADED: "Do you feel lightheaded?" (e.g., somewhat faint, woozy, weak upon standing)     Woozy 3. VERTIGO: "Do you feel like either you or the room is spinning or tilting?" (i.e. vertigo)     No 4. SEVERITY: "How bad is it?"  "Do you feel like you are going to faint?" "Can you stand and walk?"   - MILD: Feels slightly dizzy, but walking normally.   - MODERATE: Feels unsteady when walking, but not falling; interferes with normal activities (e.g., school, work).   - SEVERE: Unable to walk without falling, or requires assistance to walk without falling; feels like passing out now.      Moderate 5. ONSET:  "When did the dizziness begin?"     2-3 months ago 6. AGGRAVATING FACTORS: "Does anything make it worse?" (e.g., standing, change in head position)     Walking 7. HEART RATE: "Can you tell me your heart rate?" "How many beats in 15 seconds?"  (Note: not all patients can do this)       No 8. CAUSE: "What do you think is causing the dizziness?"     Unsure 9. RECURRENT SYMPTOM: "Have you had dizziness before?" If Yes, ask: "When was the last time?" "What happened that time?"     Yes 10. OTHER SYMPTOMS: "Do you have any other symptoms?" (e.g., fever, chest pain, vomiting, diarrhea, bleeding)       Weakness 11. PREGNANCY: "Is there any chance you are pregnant?" "When was your last menstrual period?"       No  Protocols used: Dizziness - Lightheadedness-A-AH

## 2021-04-06 LAB — COMPREHENSIVE METABOLIC PANEL
ALT: 15 IU/L (ref 0–32)
AST: 12 IU/L (ref 0–40)
Albumin/Globulin Ratio: 1.1 — ABNORMAL LOW (ref 1.2–2.2)
Albumin: 3.9 g/dL (ref 3.8–4.9)
Alkaline Phosphatase: 60 IU/L (ref 44–121)
BUN/Creatinine Ratio: 13 (ref 9–23)
BUN: 16 mg/dL (ref 6–24)
Bilirubin Total: 0.2 mg/dL (ref 0.0–1.2)
CO2: 23 mmol/L (ref 20–29)
Calcium: 9.3 mg/dL (ref 8.7–10.2)
Chloride: 101 mmol/L (ref 96–106)
Creatinine, Ser: 1.27 mg/dL — ABNORMAL HIGH (ref 0.57–1.00)
Globulin, Total: 3.6 g/dL (ref 1.5–4.5)
Glucose: 227 mg/dL — ABNORMAL HIGH (ref 65–99)
Potassium: 3.6 mmol/L (ref 3.5–5.2)
Sodium: 138 mmol/L (ref 134–144)
Total Protein: 7.5 g/dL (ref 6.0–8.5)
eGFR: 51 mL/min/{1.73_m2} — ABNORMAL LOW (ref >59)

## 2021-04-06 LAB — CBC
Hematocrit: 35.8 % (ref 34.0–46.6)
Hemoglobin: 11.9 g/dL (ref 11.1–15.9)
MCH: 26.8 pg (ref 26.6–33.0)
MCHC: 33.2 g/dL (ref 31.5–35.7)
MCV: 81 fL (ref 79–97)
Platelets: 462 10*3/uL — ABNORMAL HIGH (ref 150–450)
RBC: 4.44 x10E6/uL (ref 3.77–5.28)
RDW: 12.4 % (ref 11.7–15.4)
WBC: 13.4 10*3/uL — ABNORMAL HIGH (ref 3.4–10.8)

## 2021-04-08 ENCOUNTER — Other Ambulatory Visit: Payer: Self-pay | Admitting: Nurse Practitioner

## 2021-04-08 ENCOUNTER — Telehealth: Payer: Self-pay | Admitting: Nurse Practitioner

## 2021-04-08 ENCOUNTER — Ambulatory Visit (INDEPENDENT_AMBULATORY_CARE_PROVIDER_SITE_OTHER): Payer: Self-pay

## 2021-04-08 ENCOUNTER — Other Ambulatory Visit: Payer: Self-pay

## 2021-04-08 DIAGNOSIS — D72829 Elevated white blood cell count, unspecified: Secondary | ICD-10-CM

## 2021-04-08 LAB — POCT URINALYSIS DIP (CLINITEK)
Bilirubin, UA: NEGATIVE
Blood, UA: NEGATIVE
Glucose, UA: 500 mg/dL — AB
Leukocytes, UA: NEGATIVE
Nitrite, UA: NEGATIVE
POC PROTEIN,UA: 100 — AB
Spec Grav, UA: >=1.03 — AB (ref 1.010–1.025)
Urobilinogen, UA: 1 E.U./dL
pH, UA: 5.5 (ref 5.0–8.0)

## 2021-04-08 NOTE — Telephone Encounter (Signed)
Patient returned call to discuss lab results. WBC  count elevated - will place an order for UA

## 2021-04-08 NOTE — Progress Notes (Signed)
U/A completed

## 2021-04-09 LAB — MICROSCOPIC EXAMINATION: Epithelial Cells (non renal): >10 /hpf — AB (ref 0–10)

## 2021-04-09 LAB — URINALYSIS, ROUTINE W REFLEX MICROSCOPIC
Bilirubin, UA: NEGATIVE
Leukocytes,UA: NEGATIVE
Nitrite, UA: NEGATIVE
RBC, UA: NEGATIVE
Specific Gravity, UA: 1.026 (ref 1.005–1.030)
Urobilinogen, Ur: 1 mg/dL (ref 0.2–1.0)
pH, UA: 5 (ref 5.0–7.5)

## 2021-04-11 ENCOUNTER — Other Ambulatory Visit: Payer: Self-pay | Admitting: Nurse Practitioner

## 2021-04-11 ENCOUNTER — Other Ambulatory Visit: Payer: Self-pay

## 2021-04-11 MED ORDER — NITROFURANTOIN MONOHYD MACRO 100 MG PO CAPS
100.0000 mg | ORAL_CAPSULE | Freq: Two times a day (BID) | ORAL | 0 refills | Status: AC
  Filled 2021-04-11: qty 10, 5d supply, fill #0

## 2021-04-13 ENCOUNTER — Other Ambulatory Visit: Payer: Self-pay

## 2021-04-19 ENCOUNTER — Other Ambulatory Visit: Payer: Self-pay

## 2021-04-26 ENCOUNTER — Other Ambulatory Visit: Payer: Self-pay

## 2021-04-27 ENCOUNTER — Encounter: Payer: Self-pay | Admitting: Nurse Practitioner

## 2021-04-27 ENCOUNTER — Other Ambulatory Visit: Payer: Self-pay

## 2021-04-27 ENCOUNTER — Ambulatory Visit: Payer: Self-pay | Attending: Nurse Practitioner | Admitting: Nurse Practitioner

## 2021-04-27 VITALS — BP 183/104 | HR 67 | Ht 69.0 in | Wt 212.0 lb

## 2021-04-27 DIAGNOSIS — Z1231 Encounter for screening mammogram for malignant neoplasm of breast: Secondary | ICD-10-CM

## 2021-04-27 DIAGNOSIS — E785 Hyperlipidemia, unspecified: Secondary | ICD-10-CM | POA: Insufficient documentation

## 2021-04-27 DIAGNOSIS — E1149 Type 2 diabetes mellitus with other diabetic neurological complication: Secondary | ICD-10-CM

## 2021-04-27 DIAGNOSIS — I10 Essential (primary) hypertension: Secondary | ICD-10-CM

## 2021-04-27 DIAGNOSIS — E1165 Type 2 diabetes mellitus with hyperglycemia: Secondary | ICD-10-CM

## 2021-04-27 DIAGNOSIS — Z955 Presence of coronary angioplasty implant and graft: Secondary | ICD-10-CM | POA: Insufficient documentation

## 2021-04-27 DIAGNOSIS — I255 Ischemic cardiomyopathy: Secondary | ICD-10-CM | POA: Insufficient documentation

## 2021-04-27 DIAGNOSIS — Z794 Long term (current) use of insulin: Secondary | ICD-10-CM | POA: Insufficient documentation

## 2021-04-27 DIAGNOSIS — I2510 Atherosclerotic heart disease of native coronary artery without angina pectoris: Secondary | ICD-10-CM | POA: Insufficient documentation

## 2021-04-27 DIAGNOSIS — Z888 Allergy status to other drugs, medicaments and biological substances status: Secondary | ICD-10-CM | POA: Insufficient documentation

## 2021-04-27 DIAGNOSIS — I252 Old myocardial infarction: Secondary | ICD-10-CM | POA: Insufficient documentation

## 2021-04-27 DIAGNOSIS — Z79899 Other long term (current) drug therapy: Secondary | ICD-10-CM | POA: Insufficient documentation

## 2021-04-27 DIAGNOSIS — Z833 Family history of diabetes mellitus: Secondary | ICD-10-CM | POA: Insufficient documentation

## 2021-04-27 LAB — GLUCOSE, POCT (MANUAL RESULT ENTRY): POC Glucose: 332 mg/dl — AB (ref 70–99)

## 2021-04-27 LAB — POCT GLYCOSYLATED HEMOGLOBIN (HGB A1C): Hemoglobin A1C: 8.9 % — AB (ref 4.0–5.6)

## 2021-04-27 MED ORDER — GABAPENTIN 300 MG PO CAPS
ORAL_CAPSULE | Freq: Every day | ORAL | 1 refills | Status: DC
  Filled 2021-04-27: qty 30, 30d supply, fill #0
  Filled 2021-05-20: qty 30, 30d supply, fill #1
  Filled 2021-07-22: qty 30, 30d supply, fill #2
  Filled 2021-09-12: qty 30, 30d supply, fill #0

## 2021-04-27 MED ORDER — CARVEDILOL 25 MG PO TABS
25.0000 mg | ORAL_TABLET | Freq: Two times a day (BID) | ORAL | 0 refills | Status: DC
  Filled 2021-04-27 – 2021-08-09 (×2): qty 60, 30d supply, fill #0
  Filled 2021-10-14: qty 60, 30d supply, fill #1

## 2021-04-27 MED ORDER — GLIPIZIDE 10 MG PO TABS
ORAL_TABLET | Freq: Two times a day (BID) | ORAL | 1 refills | Status: DC
  Filled 2021-04-27: qty 60, 30d supply, fill #0
  Filled 2021-07-22: qty 60, 30d supply, fill #1

## 2021-04-27 MED ORDER — BASAGLAR KWIKPEN 100 UNIT/ML ~~LOC~~ SOPN
25.0000 [IU] | PEN_INJECTOR | Freq: Two times a day (BID) | SUBCUTANEOUS | 1 refills | Status: DC
  Filled 2021-04-27: qty 45, 90d supply, fill #0
  Filled 2021-09-12: qty 15, 30d supply, fill #0

## 2021-04-27 MED FILL — Pantoprazole Sodium EC Tab 40 MG (Base Equiv): ORAL | 30 days supply | Qty: 30 | Fill #5 | Status: AC

## 2021-04-27 NOTE — Progress Notes (Signed)
Assessment & Plan:  Amy Chaney was seen today for diabetes.  Diagnoses and all orders for this visit:  Uncontrolled type 2 diabetes mellitus with hyperglycemia ([place]) -     POCT glucose (manual entry) -     POCT glycosylated hemoglobin (Hb A1C) -     glipiZIDE (GLUCOTROL) 10 MG tablet; TAKE 1 TABLET (10 MG TOTAL) BY MOUTH 2 (TWO) TIMES DAILY BEFORE A MEAL. -     Insulin Glargine (BASAGLAR KWIKPEN) 100 UNIT/ML; Inject 25 Units into the skin 2 (two) times daily.  Primary hypertension -     carvedilol (COREG) 25 MG tablet; Take 1 tablet (25 mg total) by mouth 2 (two) times daily.  Other diabetic neurological complication associated with type 2 diabetes mellitus (HCC) -     gabapentin (NEURONTIN) 300 MG capsule; TAKE 1 CAPSULE (300 MG TOTAL) BY MOUTH AT BEDTIME.  Breast cancer screening by mammogram -     MM 3D SCREEN BREAST BILATERAL; Future   Patient has been counseled on age-appropriate routine health concerns for screening and prevention. These are reviewed and up-to-date. Referrals have been placed accordingly. Immunizations are up-to-date or declined.    Subjective:   Chief Complaint  Patient presents with   Diabetes   HPI Amy Chaney 50 y.o. female presents to office today for follow up to DM and HTN. She is a patient of Dr. Smitty Pluck.  PMH significant for: CAD (01/2013), Depression, Dyslipidemia, History of Takotsubo cardiomyopathy (11/2018), Hypertension, Ischemic cardiomyopathy (06/2018), Mild dysplasia of cervix (CIN I) (02/04/2019), STEMI involving left anterior descending coronary artery (09/2017; 06/2018), STEMI involving oth coronary artery of inferior wall (03/2013), Tobacco abuse (01/11/2016), and Type II diabetes mellitus (12/2010).    HTN Blood pressure is poorly controlled. She is out of several of her blood pressure medications for quite some time. She is currently prescribed spirinolactone 25 mg BID, entresto 97-103 mg BID, hydralazine 100 mg TID, furosemide 20 mg  dailyl, carvedilol 25 mg BID. I am refilling her expired medications today. Denies chest pain, shortness of breath, palpitations, lightheadedness, dizziness, headaches or BLE edema.   BP Readings from Last 3 Encounters:  04/27/21 (!) 183/104  02/14/21 116/70  12/22/20 132/72    DM 2 Poorly controlled. I have increased basaglar to 25 units BID from 38 units nightly. She will continue on glipizide 10 mg BID. She endorses intolerance to farxiga and metformin. Declines starting GLP-1. Hyperglycemic symptoms include neuropathy for which she is taking gabapentin. LDL not at goal with crestor 40 mg daily. Question of medication adherence as well.  Lab Results  Component Value Date   HGBA1C 8.9 (A) 04/27/2021   Lab Results  Component Value Date   LDLCALC 124 (H) 03/10/2021       Review of Systems  Constitutional:  Negative for fever, malaise/fatigue and weight loss.  HENT: Negative.  Negative for nosebleeds.   Eyes: Negative.  Negative for blurred vision, double vision and photophobia.  Respiratory: Negative.  Negative for cough and shortness of breath.   Cardiovascular: Negative.  Negative for chest pain, palpitations and leg swelling.  Gastrointestinal: Negative.  Negative for heartburn, nausea and vomiting.  Musculoskeletal: Negative.  Negative for myalgias.  Neurological:  Positive for tingling and sensory change. Negative for dizziness, focal weakness, seizures and headaches.  Psychiatric/Behavioral: Negative.  Negative for suicidal ideas.    Pantoprazole and rosuvastatin  Past Medical History:  Diagnosis Date   Coronary artery disease involving native coronary artery of native heart with angina pectoris ([place]) 01/2013  a. s/p PCI of the mid RCA 01/2013 //  b. LHC 9/14: EF 55%, RCA stent 100% occluded, LAD irregularities >>  subacute stent thrombosis, Promus DES PCI distal overlap // c) 09/2017 - Ant STEMI - LAD-D2 PCI - Successful PCI of LAD (3 overlapping DES), unable to restore flow  down D2l that was stented as well.  Likely related to downstream dissection, but unable to rewire.   Daily headache    Depression    Dyslipidemia, goal LDL below 70    History of Doppler ultrasound    carotid bruit >> a. 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  EF down to 20-25% global hypokinesis.  Was in the setting of grieving over the loss of her son from MI.  ->  EF improved to 45-50% on follow-up echocardiogram.   Hypertension    Ischemic cardiomyopathy 06/2018   s/p inferior STEMI followed by 2 anterior STEMIs: Following recovery from Takotsubo Cardiomyopathy- Echo 02/14/19: Moderate LVH.  EF improved up to 45-50%.  GR 1 DD.  Severe apical akinesis.  Normal valves.   Mild dysplasia of cervix (CIN I) 02/04/2019   Seen after colposcopy on 01/23/19 done for ASCU +HPV pap  > repeat pap and HPV test in 12 and 24 months   STEMI involving left anterior descending coronary artery ([place]) 09/2017; 06/2018   a) Severe [place] 1, 1, 1 LAD-D2 lesion (complicated by post PTCA dissection/intramural hematoma) -successful extensive PCI of the LAD but unable to maintain patency of the stented D2. b) distal mLAD stent Edge 100% thrombosis --> PTCA & Overlapping DES PCI (Synergy 3 x 12 --> 3.6-3.3 mm). D2 occluded. RCA stents patent, 65 % OM2. EF 30-35%.   STEMI involving oth coronary artery of inferior wall ([place]) 03/2013   Secondary to subacute stent thrombosis of RCA stent segment having missed 4 doses of Effient.   Tobacco abuse 01/11/2016   Type II diabetes mellitus ([place]) 12/2010   sister and son also diabetic     Past Surgical History:  Procedure Laterality Date   CHOLECYSTECTOMY  ~ 2000   CORONARY STENT INTERVENTION N/A 10/10/2017   Procedure: CORONARY STENT INTERVENTION;  Surgeon: Leonie Man, MD;  Location: [place] [facility];  Service: Cardiovascular; LAD PCI: STENT SYNERGY DES  3.5X32 (p),STENT SYNERGY DES 3.5X 8 (m), STENT SYNERGY DES 3.5X28 (d).  D2 PCI: STENT SYNERGY DES 2.5X24.  (UNABL TO REWIRE & EXPAND - TO OF DIAG AT END OF CASE)   CORONARY STENT INTERVENTION N/A 07/03/2018   Procedure: CORONARY STENT INTERVENTION;  Surgeon: Leonie Man, MD;  Location: [facility];; PTCA & overlapping DES PCI (overlaps prior stents distally) - Synergy DES 3 x 12 (3.6 mm @ overlap -- 3.3 mm distally)>   LEFT HEART CATH AND CORONARY ANGIOGRAPHY N/A 10/10/2017   Procedure: LEFT HEART CATH AND CORONARY ANGIOGRAPHY;  Surgeon: Leonie Man, MD;  Location: [place] [facility];  Service: Cardiovascular;  Laterality: N/A; -patent RCA stents with mild in-stent restenosis. [place] 1,1,1 mLD-D2 42% (complicated by dissection/intramural thrombus)   LEFT HEART CATH AND CORONARY ANGIOGRAPHY N/A 07/03/2018   Procedure: LEFT HEART CATH AND CORONARY ANGIOGRAPHY;  Surgeon: Leonie Man, MD;  Location: [place] [facility];; 100% thrombotic occlusion distal LAD stent -- DES PCI. continued 100% D2 (previously lost). patent RCA stents, 65% OM2.  EF 30-35%.   LEFT HEART CATHETERIZATION  WITH CORONARY ANGIOGRAM N/A 02/25/2013   Procedure: LEFT HEART CATHETERIZATION WITH CORONARY ANGIOGRAM;  Surgeon: Laverda Page, MD;  Location: [facility] CATH LAB;  Service: Cardiovascular;; CTO m RCA; otherwise normal coronaries   LEFT HEART CATHETERIZATION WITH CORONARY ANGIOGRAM N/A 04/01/2013   Procedure: LEFT HEART CATHETERIZATION WITH CORONARY ANGIOGRAM;  Surgeon: Laverda Page, MD;  Location: [facility] C[facility] LAB;  Service: Cardiovascular: 100% occlusion of distal RCA stent (subacute stent thrombosis -  secondary to stopping Effient).  Overlapping DES PCI   NM MYOVIEW LTD  12/2015    EF 38%.  Hypertensive response to exercise (231/132 mmHg).  INTERMEDIATE RISK due to -diffuse hypokinesis and reduced EF.  No ischemia or infarction.   PERCUTANEOUS CORONARY STENT INTERVENTION (PCI-S)  04/01/2013   Procedure: PERCUTANEOUS  CORONARY STENT INTERVENTION (PCI-S);  Surgeon: Laverda Page, MD;  Location: [facility] CATH LAB;  Service: Cardiovascular;; PCI of distal RCA stent subacute thrombosis: Promus Premier DES 3.0 mm x 20 mm.   PERCUTANEOUS CORONARY STENT INTERVENTION (PCI-S)  02/25/2013   Procedure: PERCUTANEOUS CORONARY STENT INTERVENTION (PCI-S);  Surgeon: Laverda Page, MD;  Location: [facility] CATH LAB;  RCA CTO PCI with overlapping Promus DES: 3.0 mm x 38 mm, 3.5 mm x 63m   RIGHT/LEFT HEART CATH AND CORONARY ANGIOGRAPHY N/A 11/04/2019   Procedure: RIGHT/LEFT HEART CATH AND CORONARY ANGIOGRAPHY;  Surgeon: HLeonie Man MD;  [facility];; Mildly elevated LVEDP.  Ostial D1 100%.  Proximal to mid LAD stent 10% ISR.  Mid and distal stent widely patent.  30% distal distance.  Ostial OM 2 55%.  Proximal to mid RCA stent 50% stenosis.  Mid to distal RCA 40%.  Suspect that reduced EF is related to hypertensive emergen   TRANSTHORACIC ECHOCARDIOGRAM  07/04/2018   recurrent Anterior STEMI: EF 35-40%.  Apical hypokinesis.  GRII DD.  No thrombus noted.  (Improved EF from 30% up to 35-40% from previous echo)   TRANSTHORACIC ECHOCARDIOGRAM  11/2018; 01/2019   a) TAKOTUSBO CM: EF 20-25%-diffuse HK..  Mod V thickness.  GRII DD w/ high LVEDP.  Severe LA dilation;; b) 02/14/19: Moderate LVH.  EF improved up to 45-50%.  GR 1 DD.  Severe apical akinesis.  Normal valves.   TRANSTHORACIC ECHOCARDIOGRAM  10/31/2019   Hypertensive emergency, hypoxic/hypercapnic respiratory failure;  EF 20 to 25%.  Global HK.  Mildly dilated LV.  Mildly elevated PA pressures.  Mild LA dilation.  Mild AR.  Mildly elevated RAP-8 mmHg.=> Eventual cath showed no change   TUBAL LIGATION  1994    Family History  Problem Relation Age of Onset   Diabetes Mother    Diabetes Sister    Diabetes Sister    Diabetes Son        type 1    Social History Reviewed with no changes to be made today.   Outpatient Medications Prior to Visit  Medication Sig  Dispense Refill   blood glucose meter kit and supplies Dispense based on patient and insurance preference. Use up to four times daily as directed. (FOR ICD-10 E10.9, E11.9). 1 each 0   Blood Glucose Monitoring Suppl (TRUE METRIX METER) DEVI 1 kit by Does not apply route 4 (four) times daily. 1 Device 0   Evolocumab (REPATHA SURECLICK) 1494MG/ML SOAJ Inject 140 mg into the skin every 14 (fourteen) days. Inject one pen into the subcutaneous tissue every 14 days     furosemide (LASIX) 20 MG tablet TAKE 1 TABLET BY MOUTH DAILY. MAY TAKE ADDITIONAL  TABLET 1 TO 2 TABLETS IF WEIGHT IS MORE THAN 3 TO 51BS. (Patient taking differently: Take 20 mg by mouth 2 (two) times daily.) 120 tablet 3   glucose blood (TRUE METRIX BLOOD GLUCOSE TEST) test strip USE AS INSTRUCTED 100 strip 12   hydrALAZINE (APRESOLINE) 100 MG tablet TAKE 1 TABLET (100 MG TOTAL) BY MOUTH 3 (THREE) TIMES DAILY. 90 tablet 2   naproxen sodium (ALEVE) 220 MG tablet Take 220 mg by mouth daily as needed (pain).     nitroGLYCERIN (NITROSTAT) 0.4 MG SL tablet Place 1 tablet (0.4 mg total) under the tongue every 5 (five) minutes as needed for chest pain. Reported on 11/25/2015 (Patient taking differently: Place 0.4 mg under the tongue every 5 (five) minutes as needed for chest pain (max 3 doses).) 25 tablet 3   pantoprazole (PROTONIX) 40 MG tablet TAKE 1 TABLET (40 MG TOTAL) BY MOUTH DAILY. 90 tablet 3   rosuvastatin (CRESTOR) 40 MG tablet Take 1 tablet (40 mg total) by mouth daily. 90 tablet 3   sacubitril-valsartan (ENTRESTO) 97-103 MG Take 1 tablet by mouth 2 (two) times daily. 180 tablet 0   spironolactone (ALDACTONE) 25 MG tablet TAKE 1 TABLET (25 MG TOTAL) BY MOUTH 2 (TWO) TIMES DAILY. 180 tablet 3   ticagrelor (BRILINTA) 60 MG TABS tablet TAKE 1 TABLET (60 MG TOTAL) BY MOUTH 2 (TWO) TIMES DAILY. 180 tablet 11   TRUEPLUS LANCETS 28G MISC 28 g by Does not apply route 4 (four) times daily. 120 each 2   carvedilol (COREG) 25 MG tablet TAKE 1 TABLET  (25 MG TOTAL) BY MOUTH 2 (TWO) TIMES DAILY. 180 tablet 3   gabapentin (NEURONTIN) 300 MG capsule TAKE 1 CAPSULE (300 MG TOTAL) BY MOUTH AT BEDTIME. 90 capsule 1   glipiZIDE (GLUCOTROL) 10 MG tablet TAKE 1 TABLET (10 MG TOTAL) BY MOUTH 2 (TWO) TIMES DAILY BEFORE A MEAL. 180 tablet 1   Insulin Glargine (BASAGLAR KWIKPEN) 100 UNIT/ML INJECT 38 UNITS INTO THE SKIN DAILY. 30 mL 0   No facility-administered medications prior to visit.    Allergies  Allergen Reactions   Farxiga [Dapagliflozin] Other (See Comments)    Caused UTI and polyps on kidneys        Objective:    BP (!) 183/104   Pulse 67   Ht 5' 9"  (1.753 m)   Wt 212 lb (96.2 kg)   LMP 10/20/2015   SpO2 98%   BMI 31.31 kg/m  Wt Readings from Last 3 Encounters:  04/27/21 212 lb (96.2 kg)  02/14/21 209 lb 3.2 oz (94.9 kg)  11/11/20 211 lb 9.6 oz (96 kg)    Physical Exam Vitals and nursing note reviewed.  Constitutional:      Appearance: She is well-developed.  HENT:     Head: Normocephalic and atraumatic.  Cardiovascular:     Rate and Rhythm: Normal rate and regular rhythm.     Heart sounds: Normal heart sounds. No murmur heard.   No friction rub. No gallop.  Pulmonary:     Effort: Pulmonary effort is normal. No tachypnea or respiratory distress.     Breath sounds: Normal breath sounds. No decreased breath sounds, wheezing, rhonchi or rales.  Chest:     Chest wall: No tenderness.  Abdominal:     General: Bowel sounds are normal.     Palpations: Abdomen is soft.  Musculoskeletal:        General: Normal range of motion.     Cervical back: Normal range of  motion.  Skin:    General: Skin is warm and dry.  Neurological:     Mental Status: She is alert and oriented to person, place, and time.     Coordination: Coordination normal.  Psychiatric:        Behavior: Behavior normal. Behavior is cooperative.        Thought Content: Thought content normal.        Judgment: Judgment normal.         Patient has been  counseled extensively about nutrition and exercise as well as the importance of adherence with medications and regular follow-up. The patient was given clear instructions to go to ER or return to medical center if symptoms don't improve, worsen or new problems develop. The patient verbalized understanding.   Follow-up: Return in about 4 weeks (around 05/25/2021) for BP CHECK WITH LUKE/BMP. see newlin in 3 months.   Gildardo Pounds, FNP-BC [facility] and [facility], [place]   04/27/2021, 11:25 PM

## 2021-05-06 ENCOUNTER — Other Ambulatory Visit: Payer: Self-pay

## 2021-05-06 ENCOUNTER — Other Ambulatory Visit: Payer: Self-pay | Admitting: Pharmacist

## 2021-05-06 DIAGNOSIS — E1165 Type 2 diabetes mellitus with hyperglycemia: Secondary | ICD-10-CM

## 2021-05-06 MED ORDER — TECHLITE PEN NEEDLES 32G X 4 MM MISC
2 refills | Status: DC
  Filled 2021-05-06: qty 100, 90d supply, fill #0
  Filled 2021-07-22: qty 100, 50d supply, fill #1
  Filled 2022-02-13: qty 100, 30d supply, fill #0

## 2021-05-06 MED FILL — Spironolactone Tab 25 MG: ORAL | 30 days supply | Qty: 60 | Fill #5 | Status: AC

## 2021-05-13 ENCOUNTER — Other Ambulatory Visit: Payer: Self-pay

## 2021-05-13 ENCOUNTER — Telehealth: Payer: Self-pay | Admitting: Cardiology

## 2021-05-13 MED FILL — Ticagrelor Tab 60 MG: ORAL | 90 days supply | Qty: 180 | Fill #1 | Status: CN

## 2021-05-13 NOTE — Telephone Encounter (Signed)
Patient calling the office for samples of medication:   1.  What medication and dosage are you requesting samples for? ticagrelor (BRILINTA) 60 MG TABS tablet  2.  Are you currently out of this medication?   Will be out Sunday

## 2021-05-16 ENCOUNTER — Other Ambulatory Visit: Payer: Self-pay

## 2021-05-16 MED ORDER — TICAGRELOR 60 MG PO TABS
ORAL_TABLET | ORAL | 11 refills | Status: DC
  Filled 2021-05-16 – 2021-05-20 (×2): qty 60, 30d supply, fill #0

## 2021-05-16 NOTE — Telephone Encounter (Signed)
Refill for Brilinta sent to [facility] 05/16/21

## 2021-05-17 ENCOUNTER — Telehealth: Payer: Self-pay | Admitting: Cardiology

## 2021-05-17 ENCOUNTER — Other Ambulatory Visit: Payer: Self-pay

## 2021-05-17 NOTE — Telephone Encounter (Signed)
Looks like pt has [employer] which covers [place] as a Tier 4 medication. This is a commercial plan so she can use a copay card as well which typically brings the cost down to $5 per month. If her plan isn't covering much of the med, could try for Tier exception request for the Brilinta. Otherwise, would see if MD is ok with pt changing to Plavix.

## 2021-05-17 NOTE — Telephone Encounter (Signed)
Will forward this message to our PharmD team and Dr. Allison Quarry primary covering nurse, to see if they can further assist the pt with Brilinta cost, or possible pt assistance for this medication.

## 2021-05-17 NOTE — Telephone Encounter (Signed)
Pharmacy is calling about ticagrelor (BRILINTA) 60 MG TABS tablet, patient can't afford medication.  They even tired to apply a coupon and is still came out to over $400 the patient would have to pay.  They are wondering if they is anything else the patient can take.

## 2021-05-20 ENCOUNTER — Other Ambulatory Visit: Payer: Self-pay

## 2021-05-20 ENCOUNTER — Other Ambulatory Visit: Payer: Self-pay | Admitting: Cardiology

## 2021-05-20 NOTE — Telephone Encounter (Signed)
Samples left at front desk for pt to pick up  and will forward message to [facility] LPN to see if pt qualifies for assistance Pt takes Briilinta 60  mg bid Per pt cost is $1000.00 per month

## 2021-05-20 NOTE — Telephone Encounter (Signed)
Left message for patient to call back  

## 2021-05-20 NOTE — Telephone Encounter (Signed)
Patient is following up regarding her request for samples.   1.  What medication and dosage are you requesting samples for? ticagrelor (BRILINTA) 60 MG TABS tablet   2.  Are you currently out of this medication?   Yes

## 2021-05-23 ENCOUNTER — Other Ambulatory Visit: Payer: Self-pay

## 2021-05-23 NOTE — Telephone Encounter (Signed)
Routed to primary nurse re: patient assistance  She has Medicaid -- maybe ref to Education officer, museum for assistance

## 2021-05-24 ENCOUNTER — Other Ambulatory Visit: Payer: Self-pay

## 2021-05-25 ENCOUNTER — Other Ambulatory Visit: Payer: Self-pay

## 2021-05-27 NOTE — Addendum Note (Signed)
Addended by: Raiford Simmonds on: 05/27/2021 06:36 PM   Modules accepted: Orders

## 2021-05-27 NOTE — Telephone Encounter (Signed)
  Called left message . Stop complete Brilinta and the start clopidogrel 75 mg daily. Call next week to inform nurse patient received message.   Spoke to Dr Ellyn Hack . Okay discontinue Brillianta  60 mg  twice aday due the cost an switch to plavix 75 mg  daily

## 2021-05-30 ENCOUNTER — Other Ambulatory Visit: Payer: Self-pay

## 2021-05-31 ENCOUNTER — Other Ambulatory Visit: Payer: Self-pay

## 2021-06-07 ENCOUNTER — Other Ambulatory Visit: Payer: Self-pay

## 2021-06-07 MED FILL — Spironolactone Tab 25 MG: ORAL | 30 days supply | Qty: 60 | Fill #6 | Status: AC

## 2021-06-09 ENCOUNTER — Other Ambulatory Visit: Payer: Self-pay

## 2021-06-09 MED ORDER — CLOPIDOGREL BISULFATE 75 MG PO TABS
75.0000 mg | ORAL_TABLET | Freq: Every day | ORAL | 3 refills | Status: DC
  Filled 2021-06-09: qty 90, 90d supply, fill #0
  Filled 2021-09-12: qty 30, 30d supply, fill #0
  Filled 2021-10-14: qty 30, 30d supply, fill #1

## 2021-06-09 NOTE — Telephone Encounter (Signed)
Called patient to see if she received message.  RN  gave instruction of changing  Brilinta 60 mg  twice a day to 75 mg clopidogrel daily. Due to cost  per Dr Ellyn Hack orders.  Patient verbalized understanding

## 2021-06-09 NOTE — Telephone Encounter (Signed)
See previous note medication changed to Clopidogrel 75 mg  daily

## 2021-06-09 NOTE — Addendum Note (Signed)
Addended by: Raiford Simmonds on: 06/09/2021 04:06 PM   Modules accepted: Orders

## 2021-06-10 ENCOUNTER — Other Ambulatory Visit: Payer: Self-pay

## 2021-07-08 ENCOUNTER — Other Ambulatory Visit: Payer: Self-pay

## 2021-07-08 MED FILL — Spironolactone Tab 25 MG: ORAL | 30 days supply | Qty: 60 | Fill #7 | Status: AC

## 2021-07-22 ENCOUNTER — Other Ambulatory Visit: Payer: Self-pay

## 2021-07-22 MED FILL — Pantoprazole Sodium EC Tab 40 MG (Base Equiv): ORAL | 30 days supply | Qty: 30 | Fill #6 | Status: AC

## 2021-08-09 ENCOUNTER — Other Ambulatory Visit: Payer: Self-pay

## 2021-08-09 MED FILL — Spironolactone Tab 25 MG: ORAL | 30 days supply | Qty: 60 | Fill #8 | Status: CN

## 2021-08-09 MED FILL — Spironolactone Tab 25 MG: ORAL | 30 days supply | Qty: 60 | Fill #0 | Status: AC

## 2021-08-19 ENCOUNTER — Other Ambulatory Visit: Payer: Self-pay

## 2021-08-19 ENCOUNTER — Other Ambulatory Visit: Payer: Self-pay | Admitting: Family Medicine

## 2021-08-19 DIAGNOSIS — I110 Hypertensive heart disease with heart failure: Secondary | ICD-10-CM

## 2021-08-19 MED ORDER — HYDRALAZINE HCL 100 MG PO TABS
ORAL_TABLET | Freq: Three times a day (TID) | ORAL | 0 refills | Status: DC
  Filled 2021-08-19: qty 90, 30d supply, fill #0

## 2021-08-19 NOTE — Telephone Encounter (Signed)
Requested medication (s) are due for refill today: yes  Requested medication (s) are on the active medication list: yes  Last refill:  03/31/21 #90/2  Future visit scheduled: no  Notes to clinic:  Unable to refill per protocol due to failed labs, no updated results.      Requested Prescriptions  Pending Prescriptions Disp Refills   hydrALAZINE (APRESOLINE) 100 MG tablet 90 tablet 2    Sig: TAKE 1 TABLET (100 MG TOTAL) BY MOUTH 3 (THREE) TIMES DAILY.     Cardiovascular:  Vasodilators Failed - 08/19/2021 10:25 AM      Failed - WBC in normal range and within 360 days    WBC  Date Value Ref Range Status  04/05/2021 13.4 (H) 3.4 - 10.8 x10E3/uL Final  07/22/2020 9.8 4.0 - 10.5 K/uL Final          Failed - PLT in normal range and within 360 days    Platelets  Date Value Ref Range Status  04/05/2021 462 (H) 150 - 450 x10E3/uL Final          Failed - Last BP in normal range    BP Readings from Last 1 Encounters:  04/27/21 (!) 183/104          Passed - HCT in normal range and within 360 days    Hematocrit  Date Value Ref Range Status  04/05/2021 35.8 34.0 - 46.6 % Final          Passed - HGB in normal range and within 360 days    Hemoglobin  Date Value Ref Range Status  04/05/2021 11.9 11.1 - 15.9 g/dL Final          Passed - RBC in normal range and within 360 days    RBC  Date Value Ref Range Status  04/05/2021 4.44 3.77 - 5.28 x10E6/uL Final  07/22/2020 3.54 (L) 3.87 - 5.11 MIL/uL Final          Passed - Valid encounter within last 12 months    Recent Outpatient Visits           3 months ago Uncontrolled type 2 diabetes mellitus with hyperglycemia [facility])   [place], [place] W, NP   8 months ago Hypertensive heart disease with chronic combined systolic and diastolic congestive heart failure [facility])   [place], Annie Main L, RPH-CPP   9 months ago Hypertensive heart disease with  chronic combined systolic and diastolic congestive heart failure [facility])   [place], Annie Main L, RPH-CPP   9 months ago Hypertensive heart disease with chronic combined systolic and diastolic congestive heart failure [facility])   [place], Annie Main L, RPH-CPP   10 months ago Uncontrolled type 2 diabetes mellitus with hyperglycemia [facility])   [place] [facility] [name], MD

## 2021-09-07 ENCOUNTER — Ambulatory Visit: Payer: Self-pay

## 2021-09-07 NOTE — Telephone Encounter (Signed)
°  Chief Complaint: neuropathy Symptoms: tingling burning and sometimes numbness in bilat feet Frequency: 2 months Pertinent Negatives: NA Disposition: [] ED /[] Urgent Care (no appt availability in office) / [x] Appointment(In office/virtual)/ []  [place] [facility]/ [] Home Care/ [] Refused Recommended Disposition /[] [place] [facility]/ []  Follow-up with PCP Additional Notes: advised pt if symptoms get worse or unable to wait unitl appt on 09/13/21 to call us and can give location for mobile unit.    Summary: tingling in feet   The patient has experienced tingling/burning in the soles of their feet for roughly 3 months   The patient shares that there's no swelling but the sensation is uncomfortable and lasts through the majority of their day   The patient shares that they have a history of neuropathy as well   Please contact further when available      Reason for Disposition  [1] Numbness or tingling in one or both feet AND [2] is a chronic symptom (recurrent or ongoing AND present > 4 weeks)  Answer Assessment - Initial Assessment Questions 1. SYMPTOM: "What is the main symptom you are concerned about?" (e.g., weakness, numbness)     Tingling and burning 2. ONSET: "When did this start?" (minutes, hours, days; while sleeping)     2 months  3. LAST NORMAL: "When was the last time you (the patient) were normal (no symptoms)?"     2-3 months 4. PATTERN "Does this come and go, or has it been constant since it started?"  "Is it present now?"     constant 6. NEUROLOGIC SYMPTOMS: "Have you had any of the following symptoms: headache, dizziness, vision loss, double vision, changes in speech, unsteady on your feet?"     No 7. OTHER SYMPTOMS: "Do you have any other symptoms?"     No  Protocols used: Neurologic Deficit-A-AH

## 2021-09-12 ENCOUNTER — Other Ambulatory Visit: Payer: Self-pay

## 2021-09-12 MED FILL — Spironolactone Tab 25 MG: ORAL | 30 days supply | Qty: 60 | Fill #1 | Status: AC

## 2021-09-13 ENCOUNTER — Telehealth (HOSPITAL_BASED_OUTPATIENT_CLINIC_OR_DEPARTMENT_OTHER): Payer: Medicaid Other | Admitting: Nurse Practitioner

## 2021-09-13 ENCOUNTER — Encounter: Payer: Self-pay | Admitting: Nurse Practitioner

## 2021-09-13 ENCOUNTER — Other Ambulatory Visit: Payer: Self-pay

## 2021-09-13 DIAGNOSIS — E1149 Type 2 diabetes mellitus with other diabetic neurological complication: Secondary | ICD-10-CM

## 2021-09-13 MED ORDER — GABAPENTIN 300 MG PO CAPS
300.0000 mg | ORAL_CAPSULE | Freq: Three times a day (TID) | ORAL | 1 refills | Status: DC
  Filled 2021-09-13 (×2): qty 90, 30d supply, fill #0
  Filled 2021-10-24: qty 90, 30d supply, fill #1

## 2021-09-13 NOTE — Progress Notes (Signed)
Virtual Visit via Telephone Note Due to national recommendations of social distancing due to [place] 19, telehealth visit is felt to be most appropriate for this patient at this time.  I discussed the limitations, risks, security and privacy concerns of performing an evaluation and management service by telephone and the availability of in person appointments. I also discussed with the patient that there may be a patient responsible charge related to this service. The patient expressed understanding and agreed to proceed.    I connected with Amy Chaney on 09/13/21  at  11:10 AM EST  EDT by telephone and verified that I am speaking with the correct person using two identifiers.  Location of Patient: Private Residence   Location of Provider: [place] [facility] [employer] [facility]    Persons participating in Telemedicine visit: [name] FNP-BC Amy Chaney    History of Present Illness: PMH significant for: CAD (01/2013), Depression, Dyslipidemia, History of Takotsubo cardiomyopathy (11/2018), Hypertension, Ischemic cardiomyopathy (06/2018), Mild dysplasia of cervix (CIN I) (02/04/2019), STEMI involving left anterior descending coronary artery (09/2017; 06/2018), STEMI involving oth coronary artery of inferior wall (03/2013), Tobacco abuse (01/11/2016), and Type II diabetes mellitus (12/2010).    Telemedicine visit for: NEUROPATHY Lab Results  Component Value Date   HGBA1C 8.9 (A) 04/27/2021   Endorses worsening neuropathy in both feet. Diabetes and blood pressure are poorly controlled. Symptoms of neuropathy include burning, numbness and tingling.  Denies any falls related to neuropathy.     Past Medical History:  Diagnosis Date   Coronary artery disease involving native coronary artery of native heart with angina pectoris (Amy Chaney) 01/2013   a. s/p PCI of the mid RCA 01/2013 //  b. LHC 9/14: EF 55%, RCA stent 100% occluded, LAD irregularities >>  subacute stent thrombosis,  Promus DES PCI distal overlap // c) 09/2017 - Ant STEMI - LAD-D2 PCI - Successful PCI of LAD (3 overlapping DES), unable to restore flow down D2l that was stented as well.  Likely related to downstream dissection, but unable to rewire.   Daily headache    Depression    Dyslipidemia, goal LDL below 70    History of Doppler ultrasound    carotid bruit >> a. 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  EF down to 20-25% global hypokinesis.  Was in the setting of grieving over the loss of her son from MI.  ->  EF improved to 45-50% on follow-up echocardiogram.   Hypertension    Ischemic cardiomyopathy 06/2018   s/p inferior STEMI followed by 2 anterior STEMIs: Following recovery from Takotsubo Cardiomyopathy- Echo 02/14/19: Moderate LVH.  EF improved up to 45-50%.  GR 1 DD.  Severe apical akinesis.  Normal valves.   Mild dysplasia of cervix (CIN I) 02/04/2019   Seen after colposcopy on 01/23/19 done for ASCU +HPV pap  > repeat pap and HPV test in 12 and 24 months   STEMI involving left anterior descending coronary artery (Amy Chaney) 09/2017; 06/2018   a) Severe [place] 1, 1, 1 LAD-D2 lesion (complicated by post PTCA dissection/intramural hematoma) -successful extensive PCI of the LAD but unable to maintain patency of the stented D2. b) distal mLAD stent Edge 100% thrombosis --> PTCA & Overlapping DES PCI (Synergy 3 x 12 --> 3.6-3.3 mm). D2 occluded. RCA stents patent, 65 % OM2. EF 30-35%.   STEMI involving oth coronary artery of inferior wall (  Amy Chaney) 03/2013   Secondary to subacute stent thrombosis of RCA stent segment having missed 4 doses of Effient.   Tobacco abuse 01/11/2016   Type II diabetes mellitus (Amy Chaney) 12/2010   sister and son also diabetic     Past Surgical History:  Procedure Laterality Date   CHOLECYSTECTOMY  ~ 2000   CORONARY STENT INTERVENTION N/A 10/10/2017   Procedure: CORONARY STENT  INTERVENTION;  Surgeon: Leonie Man, MD;  Location: [place] [facility];  Service: Cardiovascular; LAD PCI: STENT SYNERGY DES 3.5X32 (p),STENT SYNERGY DES 3.5X 8 (m), STENT SYNERGY DES 3.5X28 (d).  D2 PCI: STENT SYNERGY DES 2.5X24.  (UNABL TO REWIRE & EXPAND - TO OF DIAG AT END OF CASE)   CORONARY STENT INTERVENTION N/A 07/03/2018   Procedure: CORONARY STENT INTERVENTION;  Surgeon: Leonie Man, MD;  Location: [facility];; PTCA & overlapping DES PCI (overlaps prior stents distally) - Synergy DES 3 x 12 (3.6 mm @ overlap -- 3.3 mm distally)>   LEFT HEART CATH AND CORONARY ANGIOGRAPHY N/A 10/10/2017   Procedure: LEFT HEART CATH AND CORONARY ANGIOGRAPHY;  Surgeon: Leonie Man, MD;  Location: [facility];  Service: Cardiovascular;  Laterality: N/A; -patent RCA stents with mild in-stent restenosis. [place] 1,1,1 mLD-D2 14% (complicated by dissection/intramural thrombus)   LEFT HEART CATH AND CORONARY ANGIOGRAPHY N/A 07/03/2018   Procedure: LEFT HEART CATH AND CORONARY ANGIOGRAPHY;  Surgeon: Leonie Man, MD;  Location: [facility];; 100% thrombotic occlusion distal LAD stent -- DES PCI. continued 100% D2 (previously lost). patent RCA stents, 65% OM2.  EF 30-35%.   LEFT HEART CATHETERIZATION WITH CORONARY ANGIOGRAM N/A 02/25/2013   Procedure: LEFT HEART CATHETERIZATION WITH CORONARY ANGIOGRAM;  Surgeon: Laverda Page, MD;  Location: [facility] CATH LAB;  Service: Cardiovascular;; CTO m RCA; otherwise normal coronaries   LEFT HEART CATHETERIZATION WITH CORONARY ANGIOGRAM N/A 04/01/2013   Procedure: LEFT HEART CATHETERIZATION WITH CORONARY ANGIOGRAM;  Surgeon: Laverda Page, MD;  Location: [facility];  Service: Cardiovascular: 100% occlusion of distal RCA stent (subacute stent thrombosis -  secondary to stopping Effient).  Overlapping DES PCI   NM MYOVIEW LTD  12/2015    EF 38%.  Hypertensive response to exercise (231/132 mmHg).  INTERMEDIATE RISK due to -diffuse hypokinesis and  reduced EF.  No ischemia or infarction.   PERCUTANEOUS CORONARY STENT INTERVENTION (PCI-S)  04/01/2013   Procedure: PERCUTANEOUS CORONARY STENT INTERVENTION (PCI-S);  Surgeon: Laverda Page, MD;  Location: [facility] CATH LAB;  Service: Cardiovascular;; PCI of distal RCA stent subacute thrombosis: Promus Premier DES 3.0 mm x 20 mm.   PERCUTANEOUS CORONARY STENT INTERVENTION (PCI-S)  02/25/2013   Procedure: PERCUTANEOUS CORONARY STENT INTERVENTION (PCI-S);  Surgeon: Laverda Page, MD;  Location: [facility] CATH LAB;  RCA CTO PCI with overlapping Promus DES: 3.0 mm x 38 mm, 3.5 mm x 59mm   RIGHT/LEFT HEART CATH AND CORONARY ANGIOGRAPHY N/A 11/04/2019   Procedure: RIGHT/LEFT HEART CATH AND CORONARY ANGIOGRAPHY;  Surgeon: Leonie Man, MD;  Location: [facility]IVE CV LAB;; Mildly elevated LVEDP.  Ostial D1 100%.  Proximal to mid LAD stent 10% ISR.  Mid and distal stent widely patent.  30% distal distance.  Ostial OM 2 55%.  Proximal to mid RCA stent 50% stenosis.  Mid to distal RCA 40%.  Suspect that reduced EF is related to hypertensive emergen   TRANSTHORACIC ECHOCARDIOGRAM  07/04/2018   recurrent Anterior STEMI: EF 35-40%.  Apical hypokinesis.  GRII DD.  No  thrombus noted.  (Improved EF from 30% up to 35-40% from previous echo)   TRANSTHORACIC ECHOCARDIOGRAM  11/2018; 01/2019   a) TAKOTUSBO CM: EF 20-25%-diffuse HK..  Mod V thickness.  GRII DD w/ high LVEDP.  Severe LA dilation;; b) 02/14/19: Moderate LVH.  EF improved up to 45-50%.  GR 1 DD.  Severe apical akinesis.  Normal valves.   TRANSTHORACIC ECHOCARDIOGRAM  10/31/2019   Hypertensive emergency, hypoxic/hypercapnic respiratory failure;  EF 20 to 25%.  Global HK.  Mildly dilated LV.  Mildly elevated PA pressures.  Mild LA dilation.  Mild AR.  Mildly elevated RAP-8 mmHg.=> Eventual cath showed no change   TUBAL LIGATION  1994    Family History  Problem Relation Age of Onset   Diabetes Mother    Diabetes Sister    Diabetes Sister    Diabetes Son        type  1    Social History   Socioeconomic History   Marital status: Single    Spouse name: Not on file   Number of children: 3   Years of education: Not on file   Highest education level: 11th grade  Occupational History    Employer: [employer]  Tobacco Use   Smoking status: Every Day    Packs/day: 0.25    Years: 22.00    Pack years: 5.50    Types: Cigarettes   Smokeless tobacco: Never   Tobacco comments:    current smoker 4-5 cigarettes per day; previously smoked at least a pack a day.  Vaping Use   Vaping Use: Never used  Substance and Sexual Activity   Alcohol use: Yes    Alcohol/week: 0.0 standard drinks    Comment: 07/04/2018 "only on special occasions; maybe once/yr"   Drug use: Yes    Types: Marijuana    Comment: 02/25/2013 "quit marijuana ~ 2001"   Sexual activity: Not Currently    Birth control/protection: Surgical  Other Topics Concern   Not on file  Social History Narrative   Diet: 50/50 take out and home cook meals (eats burgers and lots of fried foods), limits portions.       Exercise: walks 15-20 minutes twice per week   Social Determinants of Health   Financial Resource Strain: Not on file  Food Insecurity: Not on file  Transportation Needs: Not on file  Physical Activity: Not on file  Stress: Not on file  Social Connections: Not on file     Observations/Objective: Awake, alert and oriented x 3   Review of Systems  Constitutional:  Negative for fever, malaise/fatigue and weight loss.  HENT: Negative.  Negative for nosebleeds.   Eyes: Negative.  Negative for blurred vision, double vision and photophobia.  Respiratory: Negative.  Negative for cough and shortness of breath.   Cardiovascular: Negative.  Negative for chest pain, palpitations and leg swelling.  Gastrointestinal: Negative.  Negative for heartburn, nausea and vomiting.  Musculoskeletal: Negative.  Negative for myalgias.  Neurological:  Positive for tingling and sensory change. Negative  for dizziness, focal weakness, seizures and headaches.  Psychiatric/Behavioral: Negative.  Negative for suicidal ideas.    Assessment and Plan: Diagnoses and all orders for this visit:  Other diabetic neurological complication associated with type 2 diabetes mellitus ([place]) DOSE CHANGE-     gabapentin (NEURONTIN) 300 MG capsule; Take 1 capsule (300 mg total) by mouth 3 (three) times daily.     Follow Up Instructions Return if symptoms worsen or fail to improve.  I discussed the assessment and treatment plan with the patient. The patient was provided an opportunity to ask questions and all were answered. The patient agreed with the plan and demonstrated an understanding of the instructions.   The patient was advised to call back or seek an in-person evaluation if the symptoms worsen or if the condition fails to improve as anticipated.  I provided 11 minutes of non-face-to-face time during this encounter including median intraservice time, reviewing previous notes, labs, imaging, medications and explaining diagnosis and management.  Gildardo Pounds, FNP-BC

## 2021-09-27 ENCOUNTER — Other Ambulatory Visit: Payer: Self-pay

## 2021-09-27 ENCOUNTER — Ambulatory Visit
Admission: RE | Admit: 2021-09-27 | Discharge: 2021-09-27 | Disposition: A | Discharge status: Home / Self Care | Payer: Medicaid Other | Source: Ambulatory Visit | Attending: Nurse Practitioner | Admitting: Nurse Practitioner

## 2021-09-27 ENCOUNTER — Other Ambulatory Visit: Payer: Self-pay | Admitting: Family Medicine

## 2021-09-27 DIAGNOSIS — I110 Hypertensive heart disease with heart failure: Secondary | ICD-10-CM

## 2021-09-27 DIAGNOSIS — Z1231 Encounter for screening mammogram for malignant neoplasm of breast: Secondary | ICD-10-CM

## 2021-09-27 MED ORDER — HYDRALAZINE HCL 100 MG PO TABS
ORAL_TABLET | Freq: Three times a day (TID) | ORAL | 1 refills | Status: DC
  Filled 2021-09-27: qty 90, 30d supply, fill #0
  Filled 2021-11-08: qty 90, 30d supply, fill #1

## 2021-09-29 ENCOUNTER — Other Ambulatory Visit: Payer: Self-pay

## 2021-10-14 ENCOUNTER — Other Ambulatory Visit: Payer: Self-pay

## 2021-10-14 MED FILL — Spironolactone Tab 25 MG: ORAL | 30 days supply | Qty: 60 | Fill #2 | Status: AC

## 2021-10-17 ENCOUNTER — Other Ambulatory Visit: Payer: Self-pay

## 2021-10-19 LAB — COMPREHENSIVE METABOLIC PANEL
ALT: 12 IU/L (ref 0–32)
AST: 15 IU/L (ref 0–40)
Albumin/Globulin Ratio: 1.3 (ref 1.2–2.2)
Albumin: 4.1 g/dL (ref 3.8–4.9)
Alkaline Phosphatase: 47 IU/L (ref 44–121)
BUN/Creatinine Ratio: 19 (ref 9–23)
BUN: 20 mg/dL (ref 6–24)
Bilirubin Total: 0.6 mg/dL (ref 0.0–1.2)
CO2: 22 mmol/L (ref 20–29)
Calcium: 9.2 mg/dL (ref 8.7–10.2)
Chloride: 102 mmol/L (ref 96–106)
Creatinine, Ser: 1.04 mg/dL — ABNORMAL HIGH (ref 0.57–1.00)
Globulin, Total: 3.1 g/dL (ref 1.5–4.5)
Glucose: 159 mg/dL — ABNORMAL HIGH (ref 70–99)
Potassium: 4.3 mmol/L (ref 3.5–5.2)
Sodium: 140 mmol/L (ref 134–144)
Total Protein: 7.2 g/dL (ref 6.0–8.5)
eGFR: 65 mL/min/{1.73_m2} (ref >59)

## 2021-10-19 LAB — LIPID PANEL
Chol/HDL Ratio: 2.4 ratio (ref 0.0–4.4)
Cholesterol, Total: 113 mg/dL (ref 100–199)
HDL: 48 mg/dL (ref >39)
LDL Chol Calc (NIH): 49 mg/dL (ref 0–99)
Triglycerides: 79 mg/dL (ref 0–149)
VLDL Cholesterol Cal: 16 mg/dL (ref 5–40)

## 2021-10-21 ENCOUNTER — Telehealth: Payer: Self-pay | Admitting: Cardiology

## 2021-10-21 NOTE — Telephone Encounter (Signed)
Pt updated with results and verbalized understanding.  ? ? Lab results: ?Chemistry panel relatively normal electrolytes.  Kidney function/creatinine improved from last check.-Now in the mildly abnormal/borderline normal range. ?Normal liver function test. ?  ?Cholesterol levels look great.  Total cholesterol is down to 113 with LDL down to 49.  Outstanding compared to 7 months ago.  Repatha seems to be very effective.  Great results. ?  ?  ?Glenetta Hew, MD  ? ?

## 2021-10-21 NOTE — Telephone Encounter (Signed)
Patient calling to get her recent lab results  ?

## 2021-10-24 ENCOUNTER — Other Ambulatory Visit: Payer: Self-pay

## 2021-11-07 ENCOUNTER — Telehealth: Payer: Self-pay | Admitting: Pharmacist

## 2021-11-07 DIAGNOSIS — I10 Essential (primary) hypertension: Secondary | ICD-10-CM

## 2021-11-07 NOTE — Patient Outreach (Signed)
Patient appearing on report for [employer] - Hypertension Control report due to last documented ambulatory blood pressure of 183/104 on 04/27/2021 . Next appointment with PCP is 11/16/21  ? ?Outreached patient to discuss hypertension control and medication management.  ? ?Current medications: Entresto 49/103, spironolactone 25 mg twice daily, carvedilol 25 mg twice daily, hydralazine 100 mg three times daily ? ?Patient DOES have an automated upper arm BP machine at home, but is not checking.  ? ?Diabetes: ?Current medications: Basaglar 25 units twice daily, glipizide 10 mg twice daily  ?- Prior medications - hx metformin, unclear if GI effects were with IR metformin or XR metformin as well; Farxiga (GU side effects) ? ?Current BG readings: not checking. Denies symptoms of hypoglycemia.  ? ?Patient denies side effects related to medications  ? ?Medications Reviewed Today   ? ? Reviewed by Osker Mason, RPH-CPP (Pharmacist) on 11/07/21 at 1413  Med List Status: <None>  ? ?Medication Order Taking? Sig Documenting Provider Last Dose Status Informant  ?blood glucose meter kit and supplies [id number] Yes Dispense based on patient and insurance preference. Use up to four times daily as directed. (FOR ICD-10 E10.9, E11.9). Leanor Kail, PA Taking Active Self  ?Blood Glucose Monitoring Suppl (TRUE METRIX METER) DEVI [id number] Yes 1 kit by Does not apply route 4 (four) times daily. Brayton Caves, PA-C Taking Active Self  ?  Discontinued 07/20/20 1234   ?carvedilol (COREG) 25 MG tablet [id number] Yes Take 1 tablet (25 mg total) by mouth 2 (two) times daily. Gildardo Pounds, NP Taking Active   ?clopidogrel (PLAVIX) 75 MG tablet [id number] Yes Take 1 tablet (75 mg total) by mouth daily. Leonie Man, MD Taking Active   ?Evolocumab (REPATHA SURECLICK) 329 MG/ML SOAJ [id number] Yes Inject 140 mg into the skin every 14 (fourteen) days. Inject one pen into the subcutaneous tissue every 14 days [provider] Taking Active   ?furosemide (LASIX) 20 MG tablet [id number] Yes TAKE 1 TABLET BY MOUTH DAILY. MAY TAKE ADDITIONAL TABLET 1 TO 2 TABLETS IF WEIGHT IS MORE THAN 3 TO 51BS.  ?Patient taking differently: Take 20 mg by mouth 2 (two) times daily.  ? Leonie Man, MD Taking Active Self  ?gabapentin (NEURONTIN) 300 MG capsule [id number] No Take 1 capsule (300 mg total) by mouth 3 (three) times daily.  ?Patient not taking: Reported on 11/07/2021  ? Gildardo Pounds, NP Not Taking Active   ?glipiZIDE (GLUCOTROL) 10 MG tablet [id number] Yes TAKE 1 TABLET (10 MG TOTAL) BY MOUTH 2 (TWO) TIMES DAILY BEFORE A MEAL. Gildardo Pounds, NP Taking Active   ?glucose blood (TRUE METRIX BLOOD GLUCOSE TEST) test strip [id number]  USE AS INSTRUCTED [name], MD  Active   ?hydrALAZINE (APRESOLINE) 100 MG tablet [id number] Yes TAKE 1 TABLET (100 MG TOTAL) BY MOUTH 3 (THREE) TIMES DAILY. [name], MD Taking Active   ?Insulin Glargine (BASAGLAR KWIKPEN) 100 UNIT/ML [id number] Yes Inject 25 Units into the skin 2 (two) times daily. Gildardo Pounds, NP Taking Active   ?Insulin Pen Needle (TECHLITE PEN NEEDLES) 32G X 4 MM MISC [id number] Yes use 1 pen needle with basaglar once daily [name], MD Taking Active   ?naproxen sodium (ALEVE) 220 MG tablet [id number] Yes Take 220 mg by mouth daily as needed (pain). [provider] Taking Active Self  ?nitroGLYCERIN (NITROSTAT) 0.4 MG SL tablet [id number]  Place 1 tablet (0.4 mg total) under the tongue every 5 (five) minutes as needed for  chest pain. Reported on 11/25/2015  ?Patient taking differently: Place 0.4 mg under the tongue every 5 (five) minutes as needed for chest pain (max 3 doses).  ? Leanor Kail, PA  Active   ?         ?Med Note Lyndon Code Jun 01, 2020  2:52 PM)    ?pantoprazole (PROTONIX) 40 MG tablet [id number] No TAKE 1 TABLET (40 MG TOTAL) BY MOUTH DAILY.  ?Patient not taking: Reported on 11/07/2021  ? Almyra Deforest, [place] Not Taking  Active   ?rosuvastatin (CRESTOR) 40 MG tablet [id number] Yes Take 1 tablet (40 mg total) by mouth daily. Leonie Man, MD Taking Active   ?sacubitril-valsartan [facility]) 97-103 MG [id number] Yes Take 1 tablet by mouth 2 (two) times daily. Almyra Deforest, [place] Taking Active Self  ?spironolactone (ALDACTONE) 25 MG tablet [id number] Yes TAKE 1 TABLET (25 MG TOTAL) BY MOUTH 2 (TWO) TIMES DAILY. Almyra Deforest, [place] Taking Active   ?TRUEPLUS LANCETS 28G MISC [id number] Yes 28 g by Does not apply route 4 (four) times daily. Mendel Corning, MD Taking Active Self  ? ?  ?  ? ?  ? ? ? ?Assessment/Plan: ?- Currently unknown control of HTN ?- - Reviewed goal blood pressure <130/80 ?- Reviewed appropriate administration of medication regimen ?- Counseled on long term microvascular and macrovascular complications of uncontrolled hypertension ?- Reviewed appropriate home BP monitoring technique (avoid caffeine, smoking, and exercise for 30 minutes before checking, rest for at least 5 minutes before taking BP, sit with feet flat on the floor and back against a hard surface, uncross legs, and rest arm on flat surface) ?- Reviewed to check blood pressure daily document, and provide at next provider visit ?- Encouraged to bring home BP readings to upcoming PCP appointment. Discussed re-establishment with Pharmacy for DM and HTN management. She is in agreement- appointment scheduled. Consider GLP1 for CV risk reduction. ? ? ?Catie Hedwig Morton, PharmD, BCACP ?[place] ?[phone] ? ? ?

## 2021-11-07 NOTE — Patient Instructions (Addendum)
Amy Chaney,  ? ?It was great to talk to you today! ? ?Check your blood pressure daily . Our goal is less than 130/80 ? ?We recommend a blood pressure cuff that goes around your upper arm, as these are generally the most accurate.  ? ?To appropriately check your blood pressure, make sure you do the following:  ?1) Avoid caffeine, exercise, or tobacco products for 30 minutes before checking. ?2) Sit with your back supported in a flat-backed chair. Rest your arm on something flat (arm of the chair, table, etc). ?3) Sit still with your feet flat on the floor, resting, for at least 5 minutes.  ?4) Check your blood pressure. Take 1-2 readings.  ? ?Write down these readings and bring with you to any provider appointments. Bring your home blood pressure machine with you to a provider's office for accuracy comparison at least once a year. ? ?Check your blood sugars twice daily:  ?1) Fasting, first thing in the morning before breakfast and  ?2) 2 hours after your largest meal.  ? ?For a goal A1c of less than 7%, goal fasting readings are less than 130 and goal 2 hour after meal readings are less than 180.  ? ?There are some other types of medications (like Ozempic) that can help better lower your blood sugars and do not have the risk of low blood sugars, like insulin and glipizide do, which is generally safer.  ? ?Please let us know if you have any questions or concerns! ? ?Catie Hedwig Morton, PharmD, BCACP ?[place] ?[phone] ? ?

## 2021-11-08 ENCOUNTER — Other Ambulatory Visit: Payer: Self-pay

## 2021-11-16 ENCOUNTER — Ambulatory Visit: Payer: Self-pay | Attending: Family Medicine | Admitting: Family Medicine

## 2021-11-16 ENCOUNTER — Encounter: Payer: Self-pay | Admitting: Family Medicine

## 2021-11-16 ENCOUNTER — Other Ambulatory Visit: Payer: Self-pay

## 2021-11-16 ENCOUNTER — Ambulatory Visit: Payer: Medicaid Other | Admitting: Family Medicine

## 2021-11-16 VITALS — BP 115/71 | HR 71 | Ht 69.0 in | Wt 225.0 lb

## 2021-11-16 DIAGNOSIS — I25110 Atherosclerotic heart disease of native coronary artery with unstable angina pectoris: Secondary | ICD-10-CM

## 2021-11-16 DIAGNOSIS — I110 Hypertensive heart disease with heart failure: Secondary | ICD-10-CM | POA: Insufficient documentation

## 2021-11-16 DIAGNOSIS — L600 Ingrowing nail: Secondary | ICD-10-CM

## 2021-11-16 DIAGNOSIS — I10 Essential (primary) hypertension: Secondary | ICD-10-CM

## 2021-11-16 DIAGNOSIS — Z1211 Encounter for screening for malignant neoplasm of colon: Secondary | ICD-10-CM

## 2021-11-16 DIAGNOSIS — F17210 Nicotine dependence, cigarettes, uncomplicated: Secondary | ICD-10-CM | POA: Insufficient documentation

## 2021-11-16 DIAGNOSIS — E1140 Type 2 diabetes mellitus with diabetic neuropathy, unspecified: Secondary | ICD-10-CM | POA: Insufficient documentation

## 2021-11-16 DIAGNOSIS — I252 Old myocardial infarction: Secondary | ICD-10-CM | POA: Insufficient documentation

## 2021-11-16 DIAGNOSIS — Z7984 Long term (current) use of oral hypoglycemic drugs: Secondary | ICD-10-CM | POA: Insufficient documentation

## 2021-11-16 DIAGNOSIS — E1165 Type 2 diabetes mellitus with hyperglycemia: Secondary | ICD-10-CM | POA: Insufficient documentation

## 2021-11-16 DIAGNOSIS — E1149 Type 2 diabetes mellitus with other diabetic neurological complication: Secondary | ICD-10-CM

## 2021-11-16 DIAGNOSIS — Z79899 Other long term (current) drug therapy: Secondary | ICD-10-CM | POA: Insufficient documentation

## 2021-11-16 DIAGNOSIS — L84 Corns and callosities: Secondary | ICD-10-CM | POA: Insufficient documentation

## 2021-11-16 DIAGNOSIS — Z955 Presence of coronary angioplasty implant and graft: Secondary | ICD-10-CM | POA: Insufficient documentation

## 2021-11-16 DIAGNOSIS — I2510 Atherosclerotic heart disease of native coronary artery without angina pectoris: Secondary | ICD-10-CM | POA: Insufficient documentation

## 2021-11-16 DIAGNOSIS — I5042 Chronic combined systolic (congestive) and diastolic (congestive) heart failure: Secondary | ICD-10-CM | POA: Insufficient documentation

## 2021-11-16 LAB — POCT GLYCOSYLATED HEMOGLOBIN (HGB A1C): HbA1c, POC (controlled diabetic range): 8.6 % — AB (ref 0.0–7.0)

## 2021-11-16 LAB — GLUCOSE, POCT (MANUAL RESULT ENTRY): POC Glucose: 287 mg/dl — AB (ref 70–99)

## 2021-11-16 MED ORDER — FUROSEMIDE 20 MG PO TABS
20.0000 mg | ORAL_TABLET | Freq: Two times a day (BID) | ORAL | 1 refills | Status: DC
  Filled 2021-11-16: qty 180, 90d supply, fill #0
  Filled 2022-02-13 (×2): qty 60, 30d supply, fill #1

## 2021-11-16 MED ORDER — GABAPENTIN 300 MG PO CAPS
300.0000 mg | ORAL_CAPSULE | Freq: Three times a day (TID) | ORAL | 6 refills | Status: DC
  Filled 2021-11-16: qty 90, 30d supply, fill #0
  Filled 2022-02-13: qty 90, 30d supply, fill #1
  Filled 2022-04-20: qty 90, 30d supply, fill #2

## 2021-11-16 MED ORDER — HYDRALAZINE HCL 100 MG PO TABS
ORAL_TABLET | Freq: Three times a day (TID) | ORAL | 6 refills | Status: DC
  Filled 2021-11-16: qty 90, fill #0
  Filled 2021-12-19: qty 90, 30d supply, fill #0
  Filled 2021-12-19: qty 30, 10d supply, fill #0

## 2021-11-16 MED ORDER — OZEMPIC (0.25 OR 0.5 MG/DOSE) 2 MG/1.5ML ~~LOC~~ SOPN
0.2500 mg | PEN_INJECTOR | SUBCUTANEOUS | 6 refills | Status: DC
  Filled 2021-11-16: qty 2, 70d supply, fill #0
  Filled 2022-02-13: qty 2, 56d supply, fill #0
  Filled 2022-02-28: qty 3, 56d supply, fill #0

## 2021-11-16 MED ORDER — CEPHALEXIN 500 MG PO CAPS
500.0000 mg | ORAL_CAPSULE | Freq: Two times a day (BID) | ORAL | 0 refills | Status: DC
  Filled 2021-11-16: qty 14, 7d supply, fill #0

## 2021-11-16 MED ORDER — CLOPIDOGREL BISULFATE 75 MG PO TABS
75.0000 mg | ORAL_TABLET | Freq: Every day | ORAL | 1 refills | Status: DC
  Filled 2021-11-16: qty 90, 90d supply, fill #0
  Filled 2022-02-13: qty 30, 30d supply, fill #1
  Filled 2022-03-21: qty 30, 30d supply, fill #2
  Filled 2022-04-20: qty 30, 30d supply, fill #3

## 2021-11-16 MED ORDER — GLIPIZIDE 10 MG PO TABS
ORAL_TABLET | Freq: Two times a day (BID) | ORAL | 1 refills | Status: DC
  Filled 2021-11-16: qty 180, 90d supply, fill #0
  Filled 2022-02-13 (×2): qty 60, 30d supply, fill #1
  Filled 2022-04-20: qty 60, 30d supply, fill #2
  Filled 2022-05-19: qty 60, 30d supply, fill #3

## 2021-11-16 MED ORDER — CARVEDILOL 25 MG PO TABS
25.0000 mg | ORAL_TABLET | Freq: Two times a day (BID) | ORAL | 1 refills | Status: DC
  Filled 2021-11-16: qty 180, 90d supply, fill #0
  Filled 2022-02-13 (×2): qty 60, 30d supply, fill #1
  Filled 2022-04-20: qty 60, 30d supply, fill #2
  Filled 2022-05-19: qty 60, 30d supply, fill #3

## 2021-11-16 MED ORDER — SPIRONOLACTONE 25 MG PO TABS
ORAL_TABLET | Freq: Two times a day (BID) | ORAL | 1 refills | Status: DC
  Filled 2021-11-16: qty 180, 90d supply, fill #0

## 2021-11-16 MED ORDER — DULOXETINE HCL 60 MG PO CPEP
60.0000 mg | ORAL_CAPSULE | Freq: Every day | ORAL | 3 refills | Status: DC
  Filled 2021-11-16: qty 30, 30d supply, fill #0
  Filled 2022-02-13: qty 30, 30d supply, fill #1
  Filled 2022-04-20: qty 30, 30d supply, fill #2

## 2021-11-16 MED ORDER — ROSUVASTATIN CALCIUM 40 MG PO TABS
40.0000 mg | ORAL_TABLET | Freq: Every day | ORAL | 1 refills | Status: DC
  Filled 2021-11-16 (×2): qty 90, 90d supply, fill #0
  Filled 2022-02-13: qty 30, 30d supply, fill #1
  Filled 2022-03-21: qty 30, 30d supply, fill #2
  Filled 2022-04-20: qty 30, 30d supply, fill #3

## 2021-11-16 MED ORDER — BASAGLAR KWIKPEN 100 UNIT/ML ~~LOC~~ SOPN
25.0000 [IU] | PEN_INJECTOR | Freq: Two times a day (BID) | SUBCUTANEOUS | 6 refills | Status: DC
  Filled 2021-11-16: qty 15, 30d supply, fill #0
  Filled 2022-02-13 (×2): qty 15, 30d supply, fill #1
  Filled 2022-04-20: qty 15, 30d supply, fill #2
  Filled 2022-05-19: qty 15, 30d supply, fill #3

## 2021-11-16 NOTE — Progress Notes (Signed)
? ?Subjective:  ?Patient ID: Amy Chaney, female    DOB: 18-May-1971  Age: 51 y.o. MRN: [id number] ? ?CC: Diabetes ? ? ?HPI ?Amy Chaney is a 51 y.o. year old female with a history of type 2 diabetes mellitus (A1c 8.6), hypertension, CHF (EF 50 to 55% from echo of 02/2021), CAD (status post PCI to LAD with 3 overlapping DES in 09/2017), STEMI in 06/2018. ?  ? ?Interval History: ?Today she has the following concerns: Her L big toe has been painful for the last 2 months and she thinks she might have an ingrown toenail. ?Her feet 'feel like fire' at the end of the day.  She is currently on gabapentin but is unable to take it due to the fact that it causes her to be sleepy during the day. ? ?She has insomnia, goes to bed at 8pm and is awake till about 2 AM.  She states she turns of the TV but plays games for about 30 minutes and does have a timer for it to go off.  Denies caffeine intake. ? ?She has dyspnea when she goes up 3 flights of stairs but she has none with 2 flights of stairs. She has no chest pain or dyspnea. ?Last cardiology visit was 1 year ago. ? ?Smokes about 3 Cigarettes/day. Patches were ineffective, Bupropion made her smoke more. ? ?For her diabetes mellitus she remains on Basaglar and glipizide.  Unable to tolerate SGLT2 inhibitor due to pyelonephritis and UTIs.  She has no visual concerns ?For her neuropathy she is on gabapentin. ?Past Medical History:  ?Diagnosis Date  ? Coronary artery disease involving native coronary artery of native heart with angina pectoris ([place]) 01/2013  ? a. s/p PCI of the mid RCA 01/2013 //  b. LHC 9/14: EF 55%, RCA stent 100% occluded, LAD irregularities >>  subacute stent thrombosis, Promus DES PCI distal overlap // c) 09/2017 - Ant STEMI - LAD-D2 PCI - Successful PCI of LAD (3 overlapping DES), unable to restore flow down D2l that was stented as well.  Likely related to downstream dissection, but unable to rewire.  ? Daily headache   ? Depression   ? Dyslipidemia, goal  LDL below 70   ? History of Doppler ultrasound   ? carotid bruit >> a. Carotid US 6/17: bilat ICA 1-39%  ? History of Takotsubo cardiomyopathy 11/2018  ? Admitted for acute on chronic combined systolic and diastolic heart failure.  EF down to 20-25% global hypokinesis.  Was in the setting of grieving over the loss of her son from MI.  ->  EF improved to 45-50% on follow-up echocardiogram.  ? Hypertension   ? Ischemic cardiomyopathy 06/2018  ? s/p inferior STEMI followed by 2 anterior STEMIs: Following recovery from Takotsubo Cardiomyopathy- Echo 02/14/19: Moderate LVH.  EF improved up to 45-50%.  GR 1 DD.  Severe apical akinesis.  Normal valves.  ? Mild dysplasia of cervix (CIN I) 02/04/2019  ? Seen after colposcopy on 01/23/19 done for ASCU +HPV pap  > repeat pap and HPV test in 12 and 24 months  ? STEMI involving left anterior descending coronary artery ([place]) 09/2017; 06/2018  ? a) Severe [place] 1, 1, 1 LAD-D2 lesion (complicated by post PTCA dissection/intramural hematoma) -successful extensive PCI of the LAD but unable to maintain patency of the stented D2. b) distal mLAD stent Edge 100% thrombosis --> PTCA & Overlapping DES PCI (Synergy 3 x 12 --> 3.6-3.3 mm). D2 occluded. RCA stents patent, 65 % OM2.  EF 30-35%.  ? STEMI involving oth coronary artery of inferior wall ([place]) 03/2013  ? Secondary to subacute stent thrombosis of RCA stent segment having missed 4 doses of Effient.  ? Tobacco abuse 01/11/2016  ? Type II diabetes mellitus ([place]) 12/2010  ? sister and son also diabetic   ? ? ?Past Surgical History:  ?Procedure Laterality Date  ? CHOLECYSTECTOMY  ~ 2000  ? CORONARY STENT INTERVENTION N/A 10/10/2017  ? Procedure: CORONARY STENT INTERVENTION;  Surgeon: Leonie Man, MD;  Location: [place] [facility];  Service: Cardiovascular; LAD PCI: STENT SYNERGY DES 3.5X32 (p),STENT SYNERGY DES 3.5X 8 (m), STENT SYNERGY DES 3.5X28 (d).  D2 PCI: STENT SYNERGY DES 2.5X24.  (UNABL TO REWIRE & EXPAND - TO OF DIAG AT END OF  CASE)  ? CORONARY STENT INTERVENTION N/A 07/03/2018  ? Procedure: CORONARY STENT INTERVENTION;  Surgeon: Leonie Man, MD;  Location: [facility] INVASIVE CV LAB;; PTCA & overlapping DES PCI (overlaps prior stents distally) - Synergy DES 3 x 12 (3.6 mm @ overlap -- 3.3 mm distally)>  ? LEFT HEART CATH AND CORONARY ANGIOGRAPHY N/A 10/10/2017  ? Procedure: LEFT HEART CATH AND CORONARY ANGIOGRAPHY;  Surgeon: Leonie Man, MD;  Location: [place] [facility];  Service: Cardiovascular;  Laterality: N/A; -patent RCA stents with mild in-stent restenosis. [place] 1,1,1 mLD-D2 77% (complicated by dissection/intramural thrombus)  ? LEFT HEART CATH AND CORONARY ANGIOGRAPHY N/A 07/03/2018  ? Procedure: LEFT HEART CATH AND CORONARY ANGIOGRAPHY;  Surgeon: Leonie Man, MD;  Location: [place] [facility];; 100% thrombotic occlusion distal LAD stent -- DES PCI. continued 100% D2 (previously lost). patent RCA stents, 65% OM2.  EF 30-35%.  ? LEFT HEART CATHETERIZATION WITH CORONARY ANGIOGRAM N/A 02/25/2013  ? Procedure: LEFT HEART CATHETERIZATION WITH CORONARY ANGIOGRAM;  Surgeon: Laverda Page, MD;  Location: [facility] CATH LAB;  Service: Cardiovascular;; CTO m RCA; otherwise normal coronaries  ? LEFT HEART CATHETERIZATION WITH CORONARY ANGIOGRAM N/A 04/01/2013  ? Procedure: LEFT HEART CATHETERIZATION WITH CORONARY ANGIOGRAM;  Surgeon: Laverda Page, MD;  Location: [facility] CATH LAB;  Service: Cardiovascular: 100% occlusion of distal RCA stent (subacute stent thrombosis -  secondary to stopping Effient).  Overlapping DES PCI  ? NM MYOVIEW LTD  12/2015  ?  EF 38%.  Hypertensive response to exercise (231/132 mmHg).  INTERMEDIATE RISK due to -diffuse hypokinesis and reduced EF.  No ischemia or infarction.  ? PERCUTANEOUS CORONARY STENT INTERVENTION (PCI-S)  04/01/2013  ? Procedure: PERCUTANEOUS CORONARY STENT INTERVENTION (PCI-S);  Surgeon: Laverda Page, MD;  Location: [facility] CATH LAB;  Service: Cardiovascular;; PCI of distal RCA stent subacute  thrombosis: Promus Premier DES 3.0 mm x 20 mm.  ? PERCUTANEOUS CORONARY STENT INTERVENTION (PCI-S)  02/25/2013  ? Procedure: PERCUTANEOUS CORONARY STENT INTERVENTION (PCI-S);  Surgeon: Laverda Page, MD;  Location: [facility] CATH LAB;  RCA CTO PCI with overlapping Promus DES: 3.0 mm x 38 mm, 3.5 mm x 57m  ? RIGHT/LEFT HEART CATH AND CORONARY ANGIOGRAPHY N/A 11/04/2019  ? Procedure: RIGHT/LEFT HEART CATH AND CORONARY ANGIOGRAPHY;  Surgeon: HLeonie Man MD;  [facility];; Mildly elevated LVEDP.  Ostial D1 100%.  Proximal to mid LAD stent 10% ISR.  Mid and distal stent widely patent.  30% distal distance.  Ostial OM 2 55%.  Proximal to mid RCA stent 50% stenosis.  Mid to distal RCA 40%.  Suspect that reduced EF is related to hypertensive emergen  ? TRANSTHORACIC ECHOCARDIOGRAM  07/04/2018  ? recurrent  Anterior STEMI: EF 35-40%.  Apical hypokinesis.  GRII DD.  No thrombus noted.  (Improved EF from 30% up to 35-40% from previous echo)  ? TRANSTHORACIC ECHOCARDIOGRAM  11/2018; 01/2019  ? a) TAKOTUSBO CM: EF 20-25%-diffuse HK..  Mod V thickness.  GRII DD w/ high LVEDP.  Severe LA dilation;; b) 02/14/19: Moderate LVH.  EF improved up to 45-50%.  GR 1 DD.  Severe apical akinesis.  Normal valves.  ? TRANSTHORACIC ECHOCARDIOGRAM  10/31/2019  ? Hypertensive emergency, hypoxic/hypercapnic respiratory failure;  EF 20 to 25%.  Global HK.  Mildly dilated LV.  Mildly elevated PA pressures.  Mild LA dilation.  Mild AR.  Mildly elevated RAP-8 mmHg.=> Eventual cath showed no change  ? TUBAL LIGATION  1994  ? ? ?Family History  ?Problem Relation Age of Onset  ? Diabetes Mother   ? Diabetes Sister   ? Diabetes Sister   ? Diabetes Son   ?     type 1  ? ? ?Social History  ? ?Socioeconomic History  ? Marital status: Single  ?  Spouse name: Not on file  ? Number of children: 3  ? Years of education: Not on file  ? Highest education level: 11th grade  ?Occupational History  ?  Employer: [employer]  ?Tobacco Use  ? Smoking  status: Every Day  ?  Packs/day: 0.25  ?  Years: 22.00  ?  Pack years: 5.50  ?  Types: Cigarettes  ? Smokeless tobacco: Never  ? Tobacco comments:  ?  current smoker 4-5 cigarettes per day; previously smoked

## 2021-11-16 NOTE — Patient Instructions (Signed)
Diabetes Mellitus and Foot Care Foot care is an important part of your health, especially when you have diabetes. Diabetes may cause you to have problems because of poor blood flow (circulation) to your feet and legs, which can cause your skin to: Become thinner and drier. Break more easily. Heal more slowly. Peel and crack. You may also have nerve damage (neuropathy) in your legs and feet, causing decreased feeling in them. This means that you may not notice minor injuries to your feet that could lead to more serious problems. Noticing and addressing any potential problems early is the best way to prevent future foot problems. How to care for your feet Foot hygiene  Wash your feet daily with warm water and mild soap. Do not use hot water. Then, pat your feet and the areas between your toes until they are completely dry. Do not soak your feet as this can dry your skin. Trim your toenails straight across. Do not dig under them or around the cuticle. File the edges of your nails with an emery board or nail file. Apply a moisturizing lotion or petroleum jelly to the skin on your feet and to dry, brittle toenails. Use lotion that does not contain alcohol and is unscented. Do not apply lotion between your toes. Shoes and socks Wear clean socks or stockings every day. Make sure they are not too tight. Do not wear knee-high stockings since they may decrease blood flow to your legs. Wear shoes that fit properly and have enough cushioning. Always look in your shoes before you put them on to be sure there are no objects inside. To break in new shoes, wear them for just a few hours a day. This prevents injuries on your feet. Wounds, scrapes, corns, and calluses  Check your feet daily for blisters, cuts, bruises, sores, and redness. If you cannot see the bottom of your feet, use a mirror or ask someone for help. Do not cut corns or calluses or try to remove them with medicine. If you find a minor scrape,  cut, or break in the skin on your feet, keep it and the skin around it clean and dry. You may clean these areas with mild soap and water. Do not clean the area with peroxide, alcohol, or iodine. If you have a wound, scrape, corn, or callus on your foot, look at it several times a day to make sure it is healing and not infected. Check for: Redness, swelling, or pain. Fluid or blood. Warmth. Pus or a bad smell. General tips Do not cross your legs. This may decrease blood flow to your feet. Do not use heating pads or hot water bottles on your feet. They may burn your skin. If you have lost feeling in your feet or legs, you may not know this is happening until it is too late. Protect your feet from hot and cold by wearing shoes, such as at the beach or on hot pavement. Schedule a complete foot exam at least once a year (annually) or more often if you have foot problems. Report any cuts, sores, or bruises to your health care provider immediately. Where to find more information American Diabetes Association: www.diabetes.org Association of Diabetes Care & Education Specialists: www.diabeteseducator.org Contact a health care provider if: You have a medical condition that increases your risk of infection and you have any cuts, sores, or bruises on your feet. You have an injury that is not healing. You have redness on your legs or feet. You   feel burning or tingling in your legs or feet. You have pain or cramps in your legs and feet. Your legs or feet are numb. Your feet always feel cold. You have pain around any toenails. Get help right away if: You have a wound, scrape, corn, or callus on your foot and: You have pain, swelling, or redness that gets worse. You have fluid or blood coming from the wound, scrape, corn, or callus. Your wound, scrape, corn, or callus feels warm to the touch. You have pus or a bad smell coming from the wound, scrape, corn, or callus. You have a fever. You have a red  line going up your leg. Summary Check your feet every day for blisters, cuts, bruises, sores, and redness. Apply a moisturizing lotion or petroleum jelly to the skin on your feet and to dry, brittle toenails. Wear shoes that fit properly and have enough cushioning. If you have foot problems, report any cuts, sores, or bruises to your health care provider immediately. Schedule a complete foot exam at least once a year (annually) or more often if you have foot problems. This information is not intended to replace advice given to you by your health care provider. Make sure you discuss any questions you have with your health care provider. Document Revised: 02/05/2020 Document Reviewed: 02/05/2020 Elsevier Patient Education  2023 Elsevier Inc.  

## 2021-11-17 LAB — MICROALBUMIN / CREATININE URINE RATIO
Creatinine, Urine: 26.3 mg/dL
Microalb/Creat Ratio: 33 mg/g creat — ABNORMAL HIGH (ref 0–29)
Microalbumin, Urine: 8.6 ug/mL

## 2021-11-21 ENCOUNTER — Emergency Department (HOSPITAL_COMMUNITY)
Admission: EM | Admit: 2021-11-21 | Discharge: 2021-11-21 | Discharge status: Against Medical Advice | Payer: Self-pay | Attending: Emergency Medicine | Admitting: Emergency Medicine

## 2021-11-21 ENCOUNTER — Encounter (HOSPITAL_COMMUNITY): Payer: Self-pay | Admitting: Emergency Medicine

## 2021-11-21 ENCOUNTER — Emergency Department (HOSPITAL_COMMUNITY): Payer: Self-pay

## 2021-11-21 ENCOUNTER — Other Ambulatory Visit: Payer: Self-pay

## 2021-11-21 DIAGNOSIS — Z5321 Procedure and treatment not carried out due to patient leaving prior to being seen by health care provider: Secondary | ICD-10-CM | POA: Insufficient documentation

## 2021-11-21 DIAGNOSIS — M542 Cervicalgia: Secondary | ICD-10-CM | POA: Insufficient documentation

## 2021-11-21 DIAGNOSIS — M545 Low back pain, unspecified: Secondary | ICD-10-CM | POA: Insufficient documentation

## 2021-11-21 LAB — CBC WITH DIFFERENTIAL/PLATELET
Abs Immature Granulocytes: 0.03 10*3/uL (ref 0.00–0.07)
Basophils Absolute: 0.1 10*3/uL (ref 0.0–0.1)
Basophils Relative: 1 %
Eosinophils Absolute: 0.4 10*3/uL (ref 0.0–0.5)
Eosinophils Relative: 3 %
HCT: 34.7 % — ABNORMAL LOW (ref 36.0–46.0)
Hemoglobin: 11.2 g/dL — ABNORMAL LOW (ref 12.0–15.0)
Immature Granulocytes: 0 %
Lymphocytes Relative: 22 %
Lymphs Abs: 2.3 10*3/uL (ref 0.7–4.0)
MCH: 27.7 pg (ref 26.0–34.0)
MCHC: 32.3 g/dL (ref 30.0–36.0)
MCV: 85.7 fL (ref 80.0–100.0)
Monocytes Absolute: 0.6 10*3/uL (ref 0.1–1.0)
Monocytes Relative: 6 %
Neutro Abs: 7 10*3/uL (ref 1.7–7.7)
Neutrophils Relative %: 68 %
Platelets: 298 10*3/uL (ref 150–400)
RBC: 4.05 MIL/uL (ref 3.87–5.11)
RDW: 14.5 % (ref 11.5–15.5)
WBC: 10.2 10*3/uL (ref 4.0–10.5)
nRBC: 0 % (ref 0.0–0.2)

## 2021-11-21 LAB — BASIC METABOLIC PANEL
Anion gap: 9 (ref 5–15)
BUN: 30 mg/dL — ABNORMAL HIGH (ref 6–20)
CO2: 28 mmol/L (ref 22–32)
Calcium: 9.1 mg/dL (ref 8.9–10.3)
Chloride: 100 mmol/L (ref 98–111)
Creatinine, Ser: 1.67 mg/dL — ABNORMAL HIGH (ref 0.44–1.00)
GFR, Estimated: 37 mL/min — ABNORMAL LOW (ref >60)
Glucose, Bld: 394 mg/dL — ABNORMAL HIGH (ref 70–99)
Potassium: 3.9 mmol/L (ref 3.5–5.1)
Sodium: 137 mmol/L (ref 135–145)

## 2021-11-21 LAB — TSH: TSH: 0.648 u[IU]/mL (ref 0.350–4.500)

## 2021-11-21 NOTE — ED Notes (Signed)
Pt stated that she is leaving and can not wait any longer ?

## 2021-11-21 NOTE — ED Triage Notes (Signed)
Patient coming from home, complaint of neck pain and back pain for several months, progressively worse, states feels like she has to hold her head forward to keep it up. ?

## 2021-11-21 NOTE — ED Provider Triage Note (Signed)
Emergency Medicine Provider Triage Evaluation Note ? ?Amy Chaney , a 51 y.o. female  was evaluated in triage.  Pt complains of neck pain and fatigue.  Patient reports that she has been having the symptoms for multiple months.  Patient states that she can no longer take the symptoms and came to the emergency department for further evaluation.  Patient reports pain is located to the posterior aspect of her neck and radiates into both trapezius muscles.  Patient denies any recent falls, injuries, or heavy lifting.  Patient states that her neck is more comfortable in a flexed position.  Patient reports generalized fatigue stating "I just feel so tired." ? ?Review of Systems  ?Positive: Neck pain, fatigue ?Negative: Numbness, weakness, facial asymmetry, dysarthria, chest pain, shortness of breath ? ?Physical Exam  ?BP (!) 115/59 (BP Location: Left Arm)   Pulse 61   Temp 97.7 ?F (36.5 ?C) (Oral)   Resp 15   LMP 10/20/2015   SpO2 99%  ?Gen:   Awake, no distress   ?Resp:  Normal effort  ?MSK:   Moves extremities without difficulty  ?Other:  No midline tenderness deformity to cervical, thoracic, or lumbar spine.  +5 strength to bilateral upper extremities.  Pronator drift negative.  Sensation to light touch grossly intact. ? ?Medical Decision Making  ?Medically screening exam initiated at 1:03 PM.  Appropriate orders placed.  Amy Chaney was informed that the remainder of the evaluation will be completed by another provider, this initial triage assessment does not replace that evaluation, and the importance of remaining in the ED until their evaluation is complete. ? ? ?  ?Loni Beckwith, PA-C ?11/21/21 1305 ? ?

## 2021-11-24 ENCOUNTER — Ambulatory Visit: Payer: Self-pay | Admitting: Podiatry

## 2021-11-29 ENCOUNTER — Ambulatory Visit: Payer: Self-pay | Attending: Family Medicine | Admitting: Pharmacist

## 2021-11-29 ENCOUNTER — Encounter: Payer: Self-pay | Admitting: Pharmacist

## 2021-11-29 VITALS — BP 115/72

## 2021-11-29 DIAGNOSIS — E119 Type 2 diabetes mellitus without complications: Secondary | ICD-10-CM | POA: Insufficient documentation

## 2021-11-29 DIAGNOSIS — I252 Old myocardial infarction: Secondary | ICD-10-CM | POA: Insufficient documentation

## 2021-11-29 DIAGNOSIS — I509 Heart failure, unspecified: Secondary | ICD-10-CM | POA: Insufficient documentation

## 2021-11-29 DIAGNOSIS — F17210 Nicotine dependence, cigarettes, uncomplicated: Secondary | ICD-10-CM | POA: Insufficient documentation

## 2021-11-29 DIAGNOSIS — I110 Hypertensive heart disease with heart failure: Secondary | ICD-10-CM | POA: Insufficient documentation

## 2021-11-29 DIAGNOSIS — I10 Essential (primary) hypertension: Secondary | ICD-10-CM

## 2021-11-29 DIAGNOSIS — I2510 Atherosclerotic heart disease of native coronary artery without angina pectoris: Secondary | ICD-10-CM | POA: Insufficient documentation

## 2021-11-29 DIAGNOSIS — Z79899 Other long term (current) drug therapy: Secondary | ICD-10-CM | POA: Insufficient documentation

## 2021-11-29 DIAGNOSIS — Z955 Presence of coronary angioplasty implant and graft: Secondary | ICD-10-CM | POA: Insufficient documentation

## 2021-11-29 DIAGNOSIS — Z0130 Encounter for examination of blood pressure without abnormal findings: Secondary | ICD-10-CM | POA: Insufficient documentation

## 2021-11-29 NOTE — Progress Notes (Signed)
? ?  S:    ? ?No chief complaint on file. ? ? ?Amy Chaney is a 51 y.o. female who presents for hypertension evaluation, education, and management. PMH is significant for history of type 2 diabetes mellitus (A1c 8.6), hypertension, CHF (EF 50 to 55% from echo of 02/2021), CAD (status post PCI to LAD with 3 overlapping DES in 09/2017), STEMI in 06/2018.  ? ?She was initially placed on my schedule 11/07/2021 d/t failing our BP metric. Since then, she was last seen by Primary Care Provider, Dr. Margarita Rana, on 11/16/21. BP at that visit was good.  ? ?Today, patient arrives in good spirits and presents without assistance. Denies dizziness, headache, blurred vision, swelling.  ? ?Patient reports hypertension is longstanding.  ? ?Family/Social history:  ?Fhx: DM ?Tobacco: current 0.25 PPD smoker  ?Alcohol: none reported ? ?Medication adherence reported. Patient has taken BP medications today.  ? ?Current antihypertensives include: carvedilol 25 mg BID, hydralazine 100 mg TID, spironolactone 25 mg daily ? ?Reported home BP readings: none ? ?Patient reported dietary habits:  ?-Compliant with salt restriction  ?-Denies excessive caffeine intake  ?-In general, her appetite has been poor since starting duloxetine last month  ? ?Patient-reported exercise habits: limited outside of work.  ? ?O:  ?Vitals:  ? 11/29/21 1542  ?BP: 115/72  ? ? ?Previous 3 BP readings: ?BP Readings from Last 3 Encounters:  ?11/29/21 115/72  ?11/21/21 (!) 100/54  ?11/16/21 115/71  ? ? ?BMET ?   ?Component Value Date/Time  ? NA 137 11/21/2021 1313  ? NA 140 10/18/2021 1056  ? K 3.9 11/21/2021 1313  ? CL 100 11/21/2021 1313  ? CO2 28 11/21/2021 1313  ? GLUCOSE 394 (H) 11/21/2021 1313  ? BUN 30 (H) 11/21/2021 1313  ? BUN 20 10/18/2021 1056  ? CREATININE 1.67 (H) 11/21/2021 1313  ? CREATININE 0.59 02/17/2015 1031  ? CALCIUM 9.1 11/21/2021 1313  ? GFRNONAA 37 (L) 11/21/2021 1313  ? GFRAA 89 03/17/2020 1500  ? ? ?Renal function: ?Estimated Creatinine Clearance:  50.1 mL/min (A) (by C-G formula based on SCr of 1.67 mg/dL (H)). ? ?Clinical ASCVD: No  ?The ASCVD Risk score (Arnett DK, et al., 2019) failed to calculate for the following reasons: ?  The patient has a prior MI or stroke diagnosis ? ?A/P: ?Hypertension longstanding currently at goal on current medications. BP goal < 130/80 mmHg. Medication adherence appears appropriate.  ?-Continued current regimen.  ?-F/u labs ordered - none ?-Counseled on lifestyle modifications for blood pressure control including reduced dietary sodium, increased exercise, adequate sleep. ?-Encouraged patient to check BP at home and bring log of readings to next visit. Counseled on proper use of home BP cuff.  ? ?Results reviewed and written information provided. Patient verbalized understanding of treatment plan. Total time in face-to-face counseling 15 minutes.  ? ?F/u clinic visit in 1 month. ? ?Benard Halsted, PharmD, BCACP, CPP ?Clinical Pharmacist ?[place] ?[phone] ? ? ?

## 2021-12-19 ENCOUNTER — Other Ambulatory Visit: Payer: Self-pay

## 2021-12-20 ENCOUNTER — Other Ambulatory Visit: Payer: Self-pay

## 2021-12-23 ENCOUNTER — Other Ambulatory Visit: Payer: Self-pay

## 2022-01-13 LAB — FECAL OCCULT BLOOD, IMMUNOCHEMICAL

## 2022-01-13 LAB — MICROALBUMIN / CREATININE URINE RATIO

## 2022-01-20 ENCOUNTER — Other Ambulatory Visit: Payer: Self-pay

## 2022-01-25 ENCOUNTER — Encounter: Payer: Self-pay | Admitting: Physician Assistant

## 2022-01-25 ENCOUNTER — Ambulatory Visit (INDEPENDENT_AMBULATORY_CARE_PROVIDER_SITE_OTHER): Payer: Self-pay

## 2022-01-25 ENCOUNTER — Other Ambulatory Visit: Payer: Self-pay

## 2022-01-25 ENCOUNTER — Ambulatory Visit (INDEPENDENT_AMBULATORY_CARE_PROVIDER_SITE_OTHER): Payer: Self-pay | Admitting: Physician Assistant

## 2022-01-25 VITALS — BP 130/64 | HR 59 | Ht 69.0 in | Wt 227.2 lb

## 2022-01-25 DIAGNOSIS — R55 Syncope and collapse: Secondary | ICD-10-CM

## 2022-01-25 DIAGNOSIS — N179 Acute kidney failure, unspecified: Secondary | ICD-10-CM

## 2022-01-25 DIAGNOSIS — I2510 Atherosclerotic heart disease of native coronary artery without angina pectoris: Secondary | ICD-10-CM

## 2022-01-25 DIAGNOSIS — E119 Type 2 diabetes mellitus without complications: Secondary | ICD-10-CM

## 2022-01-25 DIAGNOSIS — I110 Hypertensive heart disease with heart failure: Secondary | ICD-10-CM

## 2022-01-25 DIAGNOSIS — I5042 Chronic combined systolic (congestive) and diastolic (congestive) heart failure: Secondary | ICD-10-CM

## 2022-01-25 DIAGNOSIS — E785 Hyperlipidemia, unspecified: Secondary | ICD-10-CM

## 2022-01-25 MED ORDER — HYDRALAZINE HCL 100 MG PO TABS
ORAL_TABLET | Freq: Three times a day (TID) | ORAL | 3 refills | Status: DC
Start: 2022-01-25 — End: 2022-03-17
  Filled 2022-01-25: qty 90, 30d supply, fill #0
  Filled 2022-03-03: qty 90, 30d supply, fill #1

## 2022-01-25 NOTE — Progress Notes (Unsigned)
Enrolled patient for a 14 day Zio AT monitor to be mailed to patients home   Dr Ellyn Hack to read

## 2022-01-25 NOTE — Patient Instructions (Signed)
Medication Instructions:  Continue current medications  *If you need a refill on your cardiac medications before your next appointment, please call your pharmacy*   Lab Work: BMET today   If you have labs (blood work) drawn today and your tests are completely normal, you will receive your results only by: [place] (if you have MyChart) OR A paper copy in the mail If you have any lab test that is abnormal or we need to change your treatment, we will call you to review the results.   Testing/Procedures: ZIO AT Long term monitor-Live Telemetry  Your physician has requested you wear a ZIO patch monitor for 14 days.  This is a single patch monitor. [employer] supplies one patch monitor per enrollment. Additional  stickers are not available.  Please do not apply patch if you will be having a Nuclear Stress Test, Echocardiogram, Cardiac CT, MRI,  or Chest Xray during the period you would be wearing the monitor. The patch cannot be worn during  these tests. You cannot remove and re-apply the ZIO AT patch monitor.  Your ZIO patch monitor will be mailed 3 day USPS to your address on file. It may take 3-5 days to  receive your monitor after you have been enrolled.  Once you have received your monitor, please review the enclosed instructions. Your monitor has  already been registered assigning a specific monitor serial # to you.   Billing and Patient Assistance Program information  Amy Chaney has been supplied with any insurance information on record for billing. [employer] offers a sliding scale Patient Assistance Program for patients without insurance, or whose  insurance does not completely cover the cost of the ZIO patch monitor. You must apply for the  Patient Assistance Program to qualify for the discounted rate. To apply, call [employer] at [phone],  select option 4, select option 2 , ask to apply for the Patient Assistance Program, (you can request an  interpreter if needed). [employer]  will ask your household income and how many people are in your  household. [employer] will quote your out-of-pocket cost based on this information. They will also be able  to set up a 12 month interest free payment plan if needed.  Applying the monitor   Shave hair from upper left chest.  Hold the abrader disc by orange tab. Rub the abrader in 40 strokes over left upper chest as indicated in  your monitor instructions.  Clean area with 4 enclosed alcohol pads. Use all pads to ensure the area is cleaned thoroughly. Let  dry.  Apply patch as indicated in monitor instructions. Patch will be placed under collarbone on left side of  chest with arrow pointing upward.  Rub patch adhesive wings for 2 minutes. Remove the white label marked "1". Remove the white label  marked "2". Rub patch adhesive wings for 2 additional minutes.  While looking in a mirror, press and release button in center of patch. A small green light will flash 3-4  times. This will be your only indicator that the monitor has been turned on.  Do not shower for the first 24 hours. You may shower after the first 24 hours.  Press the button if you feel a symptom. You will hear a small click. Record Date, Time and Symptom in  the Patient Log.   Starting the Gateway  In your kit there is a Hydrographic surveyor box the size of a cellphone. This is Airline pilot. It transmits all your  recorded data to  [employer]. This box must always stay within 10 feet of you. Open the box and push the *  button. There will be a light that blinks orange and then green a few times. When the light stops  blinking, the Gateway is connected to the ZIO patch. Call [employer] at [phone] to confirm your monitor is transmitting.  Returning your monitor  Remove your patch and place it inside the [place]. In the lower half of the Gateway there is a white  bag with prepaid postage on it. Place Gateway in bag and seal. Mail package back to [place] as soon as   possible. Your physician should have your final report approximately 7 days after you have mailed back  your monitor. Call [place] at [phone] if you have questions regarding your ZIO AT  patch monitor. Call them immediately if you see an orange light blinking on your monitor.  If your monitor falls off in less than 4 days, contact our Monitor department at [phone]. If your  monitor becomes loose or falls off after 4 days call [employer] at [phone] for suggestions on  securing your monitor    Follow-Up: At [facility], you and your health needs are our priority.  As part of our continuing mission to provide you with exceptional heart care, we have created designated Provider Care Teams.  These Care Teams include your primary Cardiologist (physician) and Advanced Practice Providers (APPs -  Physician Assistants and Nurse Practitioners) who all work together to provide you with the care you need, when you need it.  We recommend signing up for the patient portal called "MyChart".  Sign up information is provided on this After Visit Summary.  MyChart is used to connect with patients for Virtual Visits (Telemedicine).  Patients are able to view lab/test results, encounter notes, upcoming appointments, etc.  Non-urgent messages can be sent to your provider as well.   To learn more about what you can do with MyChart, go to [email or web address].    Your next appointment:   6 week(s)  The format for your next appointment:   In Person  Provider:   Almyra Deforest, PA-C, Coletta Memos, FNP, Fabian Sharp, PA-C, Sande Rives, PA-C, Caron Presume, PA-C, Jory Sims, DNP, ANP, or Diona Browner, NP

## 2022-01-25 NOTE — Progress Notes (Signed)
Cardiology Office Note:    Date:  01/27/2022   ID:  Amy Chaney, DOB Dec 19, 1970, MRN [id number]  PCP:  Amy Chaney, Amy Chaney Providers Cardiologist:  Amy Hew, MD     Referring MD: Amy Rakes, MD   Chief Complaint  Patient presents with   Follow-up    Seen for Amy Chaney    History of Present Illness:    Amy Chaney is a 51 y.o. female with a hx of CAD, ischemic cardiomyopathy, chronic combined systolic and diastolic heart failure, hyperlipidemia, hypertension, DM2 and chronic tobacco abuse.  Patient had abnormal Myoview in July 2014 and subsequently underwent DES PCI of proximal to mid RCA with 2 overlapping drug-eluting stents by Amy Chaney.  She had another NSTEMI in September 2014 after stopping Effient for 4 days, this resulted in 100% subacute stent thrombosis of RCA stent, this was treated with distal overlapping drug-eluting stent.  She had anterior STEMI in March 2019 involve the bifurcation of D2 and LAD that was treated with drug-eluting stent.  She unfortunately had another an anterior STEMI in December 2019 with subacute stent thrombosis treated with DES to distal LAD.  EF was 35 to 40% the time.  Patient was admitted for flash pulmonary edema in May 2020, ejection fraction was 20 to 25% with signs of Takotsubo cardiomyopathy.  This occurred in the setting of her son's death.  She was started on carvedilol, Entresto and spironolactone.  Repeat echocardiogram in July 2020 shows EF has improved to 45 to 50%, moderate LVH, grade 1 DD, severe apical akinesis.  Patient was admitted again in April 2021 due to flash pulmonary edema and acute respiratory failure.  EF was down to 20 to 25% again on echocardiogram.  Left and right heart catheterization performed on 11/04/2019 showed 100% occluded ostial D2, patent stent in the LAD, 50% OM 2 lesion, 40% mid to distal RCA lesion.  Coronary anatomy was relatively stable, it was suspected that her reduced ejection  fraction was due to hypertensive urgency.  Patient was admitted in December 2021 due to bilateral pyelonephritis and Amy Chaney bacteremia.  She was discharged on antibiotic.  I last saw the patient in March 2022.  I increase her hydralazine to 100 mg 3 times a day.  Patient was seen by Amy Chaney in July 2022 at which time she was doing well.  She was on lower dose of Brilinta for maintenance therapy.  Repeat echocardiogram obtained on 03/10/2021 showed EF essentially normalized at 50 to 55%, akinesis of the apex, normal RV size, mild MR.  Patient presents today for follow-up.  She says occasionally she has a "bubbly" sensation in her chest, this only lasts a few seconds at a time and typically occurs while she is at rest.  She was able to climb stairs recently without any exertional chest pain or worsening dyspnea.  Given the brief episode of the symptom, I recommended continue observation.  She says her symptoms would occur about 1-2 times per week.  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  She says she got up to walk across the hallway when she started having significant dizziness and then subsequently passed out for 5 minutes.  She did not seek medical attention.  I recommended a 2-week heart monitor to further assess.  Recent blood work obtained on 11/21/2021 showed that her creatinine has jumped from the previous 1.0 up to 1.67.  I will obtain a basic metabolic panel today because I am worried the patient might be dehydrated.  If creatinine is still high, I likely will either stop the Lasix or decrease the dose.  I recommend the patient follow-up in 6 weeks for follow-up.  If her chest discomfort increasing frequency, I may consider a repeat echocardiogram.  Past Medical History:  Diagnosis Date   Coronary artery disease involving native coronary artery of native heart with angina pectoris ([place]) 01/2013   a. s/p PCI of the mid RCA 01/2013 //   b. LHC 9/14: EF 55%, RCA stent 100% occluded, LAD irregularities >>  subacute stent thrombosis, Promus DES PCI distal overlap // c) 09/2017 - Ant STEMI - LAD-D2 PCI - Successful PCI of LAD (3 overlapping DES), unable to restore flow down D2l that was stented as well.  Likely related to downstream dissection, but unable to rewire.   Daily headache    Depression    Dyslipidemia, goal LDL below 70    History of Doppler ultrasound    carotid bruit >> a. 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  EF down to 20-25% global hypokinesis.  Was in the setting of grieving over the loss of her son from MI.  ->  EF improved to 45-50% on follow-up echocardiogram.   Hypertension    Ischemic cardiomyopathy 06/2018   s/p inferior STEMI followed by 2 anterior STEMIs: Following recovery from Takotsubo Cardiomyopathy- Echo 02/14/19: Moderate LVH.  EF improved up to 45-50%.  GR 1 DD.  Severe apical akinesis.  Normal valves.   Mild dysplasia of cervix ([place]IN I) 02/04/2019   Seen after colposcopy on 01/23/19 done for ASCU +HPV pap  > repeat pap and HPV test in 12 and 24 months   STEMI involving left anterior descending coronary artery ([place]) 09/2017; 06/2018   a) Severe [place] 1, 1, 1 LAD-D2 lesion (complicated by post PTCA dissection/intramural hematoma) -successful extensive PCI of the LAD but unable to maintain patency of the stented D2. b) distal mLAD stent Edge 100% thrombosis --> PTCA & Overlapping DES PCI (Synergy 3 x 12 --> 3.6-3.3 mm). D2 occluded. RCA stents patent, 65 % OM2. EF 30-35%.   STEMI involving oth coronary artery of inferior wall ([place]) 03/2013   Secondary to subacute stent thrombosis of RCA stent segment having missed 4 doses of Effient.   Tobacco abuse 01/11/2016   Type II diabetes mellitus ([place]) 12/2010   sister and son also diabetic     Past Surgical History:  Procedure Laterality Date    CHOLECYSTECTOMY  ~ 2000   CORONARY STENT INTERVENTION N/A 10/10/2017   Procedure: CORONARY STENT INTERVENTION;  Surgeon: Leonie Man, MD;  Location: [facility];  Service: Cardiovascular; LAD PCI: STENT SYNERGY DES 3.5X32 (p),STENT SYNERGY DES 3.5X 8 (m), STENT SYNERGY DES 3.5X28 (d).  D2 PCI: STENT SYNERGY DES 2.5X24.  (UNABL TO REWIRE & EXPAND - TO OF DIAG AT END OF CASE)   CORONARY STENT INTERVENTION N/A 07/03/2018   Procedure: CORONARY STENT INTERVENTION;  Surgeon: Leonie Man, MD;  Location: [facility];; PTCA & overlapping DES PCI (overlaps prior stents distally) - Synergy DES 3 x 12 (3.6 mm @ overlap -- 3.3 mm distally)>   LEFT HEART CATH AND CORONARY ANGIOGRAPHY N/A 10/10/2017   Procedure: LEFT HEART CATH AND CORONARY ANGIOGRAPHY;  Surgeon: Leonie Man, MD;  Location: [facility];  Service: Cardiovascular;  Laterality: N/A; -patent RCA stents with mild in-stent restenosis. [place] 1,1,1 mLD-D2 50% (complicated by dissection/intramural thrombus)   LEFT HEART CATH AND CORONARY ANGIOGRAPHY N/A 07/03/2018   Procedure: LEFT HEART CATH AND CORONARY ANGIOGRAPHY;  Surgeon: Leonie Man, MD;  Location: [facility];; 100% thrombotic occlusion distal LAD stent -- DES PCI. continued 100% D2 (previously lost). patent RCA stents, 65% OM2.  EF 30-35%.   LEFT HEART CATHETERIZATION WITH CORONARY ANGIOGRAM N/A 02/25/2013   Procedure: LEFT HEART CATHETERIZATION WITH CORONARY ANGIOGRAM;  Surgeon: Laverda Page, MD;  Location: [facility] C[facility] LAB;  Service: Cardiovascular;; CTO m RCA; otherwise normal coronaries   LEFT HEART CATHETERIZATION WITH CORONARY ANGIOGRAM N/A 04/01/2013   Procedure: LEFT HEART CATHETERIZATION WITH CORONARY ANGIOGRAM;  Surgeon: Laverda Page, MD;  Location: [facility] CATH LAB;  Service: Cardiovascular: 100% occlusion of distal RCA stent (subacute stent thrombosis -  secondary to stopping Effient).  Overlapping DES PCI   NM MYOVIEW LTD  12/2015    EF 38%.   Hypertensive response to exercise (231/132 mmHg).  INTERMEDIATE RISK due to -diffuse hypokinesis and reduced EF.  No ischemia or infarction.   PERCUTANEOUS CORONARY STENT INTERVENTION (PCI-S)  04/01/2013   Procedure: PERCUTANEOUS CORONARY STENT INTERVENTION (PCI-S);  Surgeon: Laverda Page, MD;  Location: [facility] CATH LAB;  Service: Cardiovascular;; PCI of distal RCA stent subacute thrombosis: Promus Premier DES 3.0 mm x 20 mm.   PERCUTANEOUS CORONARY STENT INTERVENTION (PCI-S)  02/25/2013   Procedure: PERCUTANEOUS CORONARY STENT INTERVENTION (PCI-S);  Surgeon: Laverda Page, MD;  Location: [facility] CATH LAB;  RCA CTO PCI with overlapping Promus DES: 3.0 mm x 38 mm, 3.5 mm x 31m   RIGHT/LEFT HEART CATH AND CORONARY ANGIOGRAPHY N/A 11/04/2019   Procedure: RIGHT/LEFT HEART CATH AND CORONARY ANGIOGRAPHY;  Surgeon: HLeonie Man MD;  [facility];; Mildly elevated LVEDP.  Ostial D1 100%.  Proximal to mid LAD stent 10% ISR.  Mid and distal stent widely patent.  30% distal distance.  Ostial OM 2 55%.  Proximal to mid RCA stent 50% stenosis.  Mid to distal RCA 40%.  Suspect that reduced EF is related to hypertensive emergen   TRANSTHORACIC ECHOCARDIOGRAM  07/04/2018   recurrent Anterior STEMI: EF 35-40%.  Apical hypokinesis.  GRII DD.  No thrombus noted.  (Improved EF from 30% up to 35-40% from previous echo)   TRANSTHORACIC ECHOCARDIOGRAM  11/2018; 01/2019   a) TAKOTUSBO CM: EF 20-25%-diffuse HK..  Mod V thickness.  GRII DD w/ high LVEDP.  Severe LA dilation;; b) 02/14/19: Moderate LVH.  EF improved up to 45-50%.  GR 1 DD.  Severe apical akinesis.  Normal valves.   TRANSTHORACIC ECHOCARDIOGRAM  10/31/2019   Hypertensive emergency, hypoxic/hypercapnic respiratory failure;  EF 20 to 25%.  Global HK.  Mildly dilated LV.  Mildly elevated PA pressures.  Mild LA dilation.  Mild AR.  Mildly elevated RAP-8 mmHg.=> Eventual cath showed no change   TUBAL LIGATION  1994    Current Medications: Current  Meds  Medication Sig   blood glucose meter kit and supplies Dispense based on patient and insurance preference. Use up to four times daily as directed. (FOR ICD-10 E10.9, E11.9).   Blood Glucose Monitoring Suppl (TRUE METRIX METER) DEVI 1 kit by Does not apply route 4 (four) times daily.   carvedilol (COREG) 25 MG tablet Take 1 tablet (25 mg total) by mouth 2 (two) times daily.   cephALEXin (  KEFLEX) 500 MG capsule Take 1 capsule (500 mg total) by mouth 2 (two) times daily.   clopidogrel (PLAVIX) 75 MG tablet Take 1 tablet (75 mg total) by mouth daily.   DULoxetine (CYMBALTA) 60 MG capsule Take 1 capsule (60 mg total) by mouth daily. For diabetic neuropathy   Evolocumab (REPATHA SURECLICK) 353 MG/ML SOAJ Inject 140 mg into the skin every 14 (fourteen) days. Inject one pen into the subcutaneous tissue every 14 days   furosemide (LASIX) 20 MG tablet Take 1 tablet (20 mg total) by mouth 2 (two) times daily.   gabapentin (NEURONTIN) 300 MG capsule Take 1 capsule (300 mg total) by mouth 3 (three) times daily.   glipiZIDE (GLUCOTROL) 10 MG tablet TAKE 1 TABLET (10 MG TOTAL) BY MOUTH 2 (TWO) TIMES DAILY BEFORE A MEAL.   Insulin Glargine (BASAGLAR KWIKPEN) 100 UNIT/ML Inject 25 Units into the skin 2 (two) times daily.   Insulin Pen Needle (TECHLITE PEN NEEDLES) 32G X 4 MM MISC use 1 pen needle with basaglar once daily   Multiple Vitamins-Minerals (WOMENS 50+ MULTI VITAMIN PO) Take 1 tablet by mouth daily.   naproxen sodium (ALEVE) 220 MG tablet Take 220 mg by mouth daily as needed (pain).   nitroGLYCERIN (NITROSTAT) 0.4 MG SL tablet Place 1 tablet (0.4 mg total) under the tongue every 5 (five) minutes as needed for chest pain. Reported on 11/25/2015 (Patient taking differently: Place 0.4 mg under the tongue every 5 (five) minutes as needed for chest pain (max 3 doses).)   rosuvastatin (CRESTOR) 40 MG tablet Take 1 tablet (40 mg total) by mouth daily.   sacubitril-valsartan (ENTRESTO) 97-103 MG Take 1 tablet  by mouth 2 (two) times daily.   TRUEPLUS LANCETS 28G MISC 28 g by Does not apply route 4 (four) times daily.   [DISCONTINUED] hydrALAZINE (APRESOLINE) 100 MG tablet TAKE 1 TABLET (100 MG TOTAL) BY MOUTH 3 (THREE) TIMES DAILY.   [DISCONTINUED] spironolactone (ALDACTONE) 25 MG tablet TAKE 1 TABLET (25 MG TOTAL) BY MOUTH 2 (TWO) TIMES DAILY.     Allergies:   [place] [dapagliflozin]   Social History   Socioeconomic History   Marital status: Single    Spouse name: Not on file   Number of children: 3   Years of education: Not on file   Highest education level: 11th grade  Occupational History    Employer: [employer]  Tobacco Use   Smoking status: Every Day    Packs/day: 0.25    Years: 22.00    Total pack years: 5.50    Types: Cigarettes   Smokeless tobacco: Never   Tobacco comments:    current smoker 4-5 cigarettes per day; previously smoked at least a pack a day.  Vaping Use   Vaping Use: Never used  Substance and Sexual Activity   Alcohol use: Yes    Alcohol/week: 0.0 standard drinks of alcohol    Comment: 07/04/2018 "only on special occasions; maybe once/yr"   Drug use: Yes    Types: Marijuana    Comment: 02/25/2013 "quit marijuana ~ 2001"   Sexual activity: Not Currently    Birth control/protection: Surgical  Other Topics Concern   Not on file  Social History Narrative   Diet: 50/50 take out and home cook meals (eats burgers and lots of fried foods), limits portions.       Exercise: walks 15-20 minutes twice per week   Social Determinants of Health   Financial Resource Strain: Not on file  Food Insecurity: Not on  file  Transportation Needs: No Transportation Needs (01/16/2019)   PRAPARE - Hydrologist (Medical): No    Lack of Transportation (Non-Medical): No  Physical Activity: Not on file  Stress: Not on file  Social Connections: Not on file     Family History: The patient's family history includes Diabetes in her mother, sister,  sister, and son.  ROS:   Please see the history of present illness.     All other systems reviewed and are negative.  EKGs/Labs/Other Studies Reviewed:    The following studies were reviewed today:  Echo 03/10/2021  1. Apical akinesis with overall low normal LV function.   2. Left ventricular ejection fraction, by estimation, is 50 to 55%. The  left ventricle has low normal function. The left ventricle demonstrates  regional wall motion abnormalities (see scoring diagram/findings for  description). There is mild left  ventricular hypertrophy. Left ventricular diastolic parameters are  consistent with Grade II diastolic dysfunction (pseudonormalization).  Elevated left atrial pressure.   3. Right ventricular systolic function is normal. The right ventricular  size is normal. There is normal pulmonary artery systolic pressure.   4. Left atrial size was moderately dilated.   5. The mitral valve is normal in structure. Mild mitral valve  regurgitation. No evidence of mitral stenosis.   6. The aortic valve is tricuspid. Aortic valve regurgitation is not  visualized. No aortic stenosis is present.   7. The inferior vena cava is normal in size with greater than 50%  respiratory variability, suggesting right atrial pressure of 3 mmHg.   EKG:  EKG is ordered today.  The ekg ordered today demonstrates normal sinus rhythm, no significant ST-T wave changes.  Recent Labs: 10/18/2021: ALT 12 11/21/2021: Hemoglobin 11.2; Platelets 298; TSH 0.648 01/25/2022: BUN 17; Creatinine, Ser 1.30; Potassium 3.7; Sodium 139  Recent Lipid Panel    Component Value Date/Time   CHOL 113 10/18/2021 1057   TRIG 79 10/18/2021 1057   HDL 48 10/18/2021 1057   CHOLHDL 2.4 10/18/2021 1057   CHOLHDL 4.2 07/05/2018 0330   VLDL 15 07/05/2018 0330   LDLCALC 49 10/18/2021 1057     Risk Assessment/Calculations:           Physical Exam:    VS:  BP 130/64   Pulse (!) 59   Ht 5' 9"  (1.753 m)   Wt 227 lb 3.2  oz (103.1 kg)   LMP 10/20/2015   SpO2 100%   BMI 33.55 kg/m     Wt Readings from Last 3 Encounters:  01/25/22 227 lb 3.2 oz (103.1 kg)  11/16/21 225 lb (102.1 kg)  04/27/21 212 lb (96.2 kg)     GEN:  Well nourished, well developed in no acute distress HEENT: Normal NECK: No JVD; No carotid bruits LYMPHATICS: No lymphadenopathy CARDIAC: RRR, no murmurs, rubs, gallops RESPIRATORY:  Clear to auscultation without rales, wheezing or rhonchi  ABDOMEN: Soft, non-tender, non-distended MUSCULOSKELETAL:  No edema; No deformity  SKIN: Warm and dry NEUROLOGIC:  Alert and oriented x 3 PSYCHIATRIC:  Normal affect   ASSESSMENT:    1. Syncope, unspecified syncope type   2. Hypertensive heart disease with chronic combined systolic and diastolic congestive heart failure ([place])   3. Coronary artery disease involving native coronary artery of native heart without angina pectoris   4. Hyperlipidemia LDL goal <70   5. Controlled type 2 diabetes mellitus without complication, without long-term current use of insulin ([place])   6. AKI (acute kidney  injury) ([place])    PLAN:    In order of problems listed above:  Syncope: 1 episode of syncope occurred several weeks ago, no recurrence since then.  I recommended a 2-week heart monitor.  Previous blood work showed AKI, I suspect the patient was dehydrated at the time.  We will repeat a basic metabolic panel  Combined systolic and diastolic heart failure: Euvolemic on exam  CAD: Denies any recent major chest pain.  She does describe a bubbly sensation in her chest that only last a few seconds at a time.  I recommended continue observation  Hyperlipidemia: On Repatha  DM2: Managed by primary care provider  AKI: Recent blood work showed creatinine increased to 1.6.  Obtain repeat basic metabolic panel.            Medication Adjustments/Labs and Tests Ordered: Current medicines are reviewed at length with the patient today.  Concerns regarding  medicines are outlined above.  Orders Placed This Encounter  Procedures   Basic metabolic panel   LONG TERM MONITOR-LIVE TELEMETRY (3-14 DAYS)   EKG 12-Lead   Meds ordered this encounter  Medications   hydrALAZINE (APRESOLINE) 100 MG tablet    Sig: TAKE 1 TABLET (100 MG TOTAL) BY MOUTH 3 (THREE) TIMES DAILY.    Dispense:  270 tablet    Refill:  3    Patient Instructions  Medication Instructions:  Continue current medications  *If you need a refill on your cardiac medications before your next appointment, please call your pharmacy*   Lab Work: BMET today   If you have labs (blood work) drawn today and your tests are completely normal, you will receive your results only by: [place] (if you have MyChart) OR A paper copy in the mail If you have any lab test that is abnormal or we need to change your treatment, we will call you to review the results.   Testing/Procedures: ZIO AT Long term monitor-Live Telemetry  Your physician has requested you wear a ZIO patch monitor for 14 days.  This is a single patch monitor. [employer] supplies one patch monitor per enrollment. Additional  stickers are not available.  Please do not apply patch if you will be having a Nuclear Stress Test, Echocardiogram, Cardiac CT, MRI,  or Chest Xray during the period you would be wearing the monitor. The patch cannot be worn during  these tests. You cannot remove and re-apply the ZIO AT patch monitor.  Your ZIO patch monitor will be mailed 3 day USPS to your address on file. It may take 3-5 days to  receive your monitor after you have been enrolled.  Once you have received your monitor, please review the enclosed instructions. Your monitor has  already been registered assigning a specific monitor serial # to you.   Billing and Patient Assistance Program information  Theodore Demark has been supplied with any insurance information on record for billing. [employer] offers a sliding scale Patient Assistance  Program for patients without insurance, or whose  insurance does not completely cover the cost of the ZIO patch monitor. You must apply for the  Patient Assistance Program to qualify for the discounted rate. To apply, call [employer] at [phone],  select option 4, select option 2 , ask to apply for the Patient Assistance Program, (you can request an  interpreter if needed). [employer] will ask your household income and how many people are in your  household. [employer] will quote your out-of-pocket cost based on this information. They will also  be able  to set up a 12 month interest free payment plan if needed.  Applying the monitor   Shave hair from upper left chest.  Hold the abrader disc by orange tab. Rub the abrader in 40 strokes over left upper chest as indicated in  your monitor instructions.  Clean area with 4 enclosed alcohol pads. Use all pads to ensure the area is cleaned thoroughly. Let  dry.  Apply patch as indicated in monitor instructions. Patch will be placed under collarbone on left side of  chest with arrow pointing upward.  Rub patch adhesive wings for 2 minutes. Remove the white label marked "1". Remove the white label  marked "2". Rub patch adhesive wings for 2 additional minutes.  While looking in a mirror, press and release button in center of patch. A small green light will flash 3-4  times. This will be your only indicator that the monitor has been turned on.  Do not shower for the first 24 hours. You may shower after the first 24 hours.  Press the button if you feel a symptom. You will hear a small click. Record Date, Time and Symptom in  the Patient Log.   Starting the Gateway  In your kit there is a Hydrographic surveyor box the size of a cellphone. This is Airline pilot. It transmits all your  recorded data to [facility]. This box must always stay within 10 feet of you. Open the box and push the *  button. There will be a light that blinks orange and then green a few times.  When the light stops  blinking, the Gateway is connected to the ZIO patch. Call [employer] at [phone] to confirm your monitor is transmitting.  Returning your monitor  Remove your patch and place it inside the [place]. In the lower half of the Gateway there is a white  bag with prepaid postage on it. Place Gateway in bag and seal. Mail package back to [place] as soon as  possible. Your physician should have your final report approximately 7 days after you have mailed back  your monitor. Call [place] at [phone] if you have questions regarding your ZIO AT  patch monitor. Call them immediately if you see an orange light blinking on your monitor.  If your monitor falls off in less than 4 days, contact our Monitor department at [phone]. If your  monitor becomes loose or falls off after 4 days call [employer] at [phone] for suggestions on  securing your monitor    Follow-Up: At [facility], you and your health needs are our priority.  As part of our continuing mission to provide you with exceptional heart care, we have created designated Provider Care Teams.  These Care Teams include your primary Cardiologist (physician) and Advanced Practice Providers (APPs -  Physician Assistants and Nurse Practitioners) who all work together to provide you with the care you need, when you need it.  We recommend signing up for the patient portal called "MyChart".  Sign up information is provided on this After Visit Summary.  MyChart is used to connect with patients for Virtual Visits (Telemedicine).  Patients are able to view lab/test results, encounter notes, upcoming appointments, etc.  Non-urgent messages can be sent to your provider as well.   To learn more about what you can do with MyChart, go to [email or web address].    Your next appointment:   6 week(s)  The format for your next appointment:   In Person  Provider:   Almyra Deforest, PA-C, Coletta Memos,  FNP, Fabian Sharp, PA-C, Sande Rives, PA-C, Caron Presume, PA-C, Jory Sims, DNP, ANP, or Diona Browner, NP       Signed, Almyra Deforest, [place]  01/27/2022 10:15 PM    [place]

## 2022-01-26 ENCOUNTER — Other Ambulatory Visit: Payer: Self-pay

## 2022-01-26 ENCOUNTER — Telehealth: Payer: Self-pay

## 2022-01-26 DIAGNOSIS — Z79899 Other long term (current) drug therapy: Secondary | ICD-10-CM

## 2022-01-26 DIAGNOSIS — I5042 Chronic combined systolic (congestive) and diastolic (congestive) heart failure: Secondary | ICD-10-CM

## 2022-01-26 LAB — BASIC METABOLIC PANEL
BUN/Creatinine Ratio: 13 (ref 9–23)
BUN: 17 mg/dL (ref 6–24)
CO2: 24 mmol/L (ref 20–29)
Calcium: 9.1 mg/dL (ref 8.7–10.2)
Chloride: 97 mmol/L (ref 96–106)
Creatinine, Ser: 1.3 mg/dL — ABNORMAL HIGH (ref 0.57–1.00)
Glucose: 473 mg/dL — ABNORMAL HIGH (ref 70–99)
Potassium: 3.7 mmol/L (ref 3.5–5.2)
Sodium: 139 mmol/L (ref 134–144)
eGFR: 49 mL/min/{1.73_m2} — ABNORMAL LOW (ref >59)

## 2022-01-26 MED ORDER — SPIRONOLACTONE 25 MG PO TABS
25.0000 mg | ORAL_TABLET | Freq: Every day | ORAL | 1 refills | Status: DC
  Filled 2022-01-26: qty 90, 90d supply, fill #0

## 2022-01-26 NOTE — Telephone Encounter (Addendum)
Left voice message for patient to give office a call to discuss recent results and medication change. Will try calling again.  ----- Message from Almyra Deforest, [place] sent at 01/26/2022  7:59 AM EDT ----- Kidney function has improved from 2 month ago but still not at baseline, recommend reduce spironolactone to '25mg'$  daily instead of twice a day. Repeat BMET in 1 month

## 2022-01-26 NOTE — Telephone Encounter (Signed)
Patient returning call from [employer]

## 2022-01-26 NOTE — Addendum Note (Signed)
Addended by: Waylan Rocher on: 01/26/2022 10:33 AM   Modules accepted: Orders

## 2022-01-26 NOTE — Telephone Encounter (Signed)
Pt informed of providers result & recommendations. Pt verbalized understanding. No further questions . Future lab ordered. Med list changed

## 2022-01-27 ENCOUNTER — Encounter: Payer: Self-pay | Admitting: Physician Assistant

## 2022-01-27 DIAGNOSIS — R55 Syncope and collapse: Secondary | ICD-10-CM

## 2022-02-02 ENCOUNTER — Other Ambulatory Visit: Payer: Self-pay

## 2022-02-03 ENCOUNTER — Other Ambulatory Visit: Payer: Self-pay

## 2022-02-07 ENCOUNTER — Telehealth: Payer: Self-pay | Admitting: Licensed Clinical Social Worker

## 2022-02-07 NOTE — Telephone Encounter (Signed)
H&V Care Navigation CSW Progress Note  Clinical Social Worker contacted patient by phone to f/u on appt with [facility]. Pt listed as uninsured [facility] Family Planning only). May be eligible for [facility] and CAFA. I attempted pt on 7/5, left voicemail requesting return call. Pt returned my call while I was on PAL, I attempted to reach her again today (7/11). No answer again, left additional voicemail. Will reattempt as able.   Patient is participating in a Managed Medicaid Plan:  No, self pay only (Medicaid Family Planning)  SDOH Screenings   Alcohol Screen: Not on file  Depression (PHQ2-9): Medium Risk (11/16/2021)   Depression (PHQ2-9)    PHQ-2 Score: 14  Financial Resource Strain: Not on file  Food Insecurity: Not on file  Housing: Not on file  Physical Activity: Not on file  Social Connections: Not on file  Stress: Not on file  Tobacco Use: High Risk (01/27/2022)   Patient History    Smoking Tobacco Use: Every Day    Smokeless Tobacco Use: Never    Passive Exposure: Not on file  Transportation Needs: No Transportation Needs (01/16/2019)   PRAPARE - Transportation    Lack of Transportation (Medical): No    Lack of Transportation (Non-Medical): No    Westley Hummer, MSW, [place]  [phone]- work cell phone (preferred) [phone]- desk phone

## 2022-02-08 ENCOUNTER — Other Ambulatory Visit: Payer: Self-pay

## 2022-02-10 ENCOUNTER — Other Ambulatory Visit: Payer: Self-pay | Admitting: Family Medicine

## 2022-02-10 MED ORDER — SACUBITRIL-VALSARTAN 97-103 MG PO TABS: 1.0000 | ORAL_TABLET | Freq: Two times a day (BID) | ORAL | 3 refills | Status: DC

## 2022-02-13 ENCOUNTER — Other Ambulatory Visit: Payer: Self-pay

## 2022-02-13 ENCOUNTER — Telehealth: Payer: Self-pay | Admitting: Licensed Clinical Social Worker

## 2022-02-13 NOTE — Progress Notes (Signed)
Heart and Vascular Care Navigation  02/13/2022  Amy Chaney Jul 07, 1971 [id number]  Reason for Referral:  Patient is participating in a Managed Medicaid Plan: No, [employer] commercial and Medicaid Family Planning. Engaged with patient by telephone for initial visit for Heart and Vascular Care Coordination.                                                                                                   Assessment:      LCSW was able to reach pt this morning at [phone]. Introduced self, role, reason for call. Pt confirmed home address, PCP and her daughter remains her current contact. Pt confirmed pt daughter phone number is now [phone]. 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 She does have [employer] as of 01/28/2022 since she went to DSS and they only approved her for [facility] Family Planning again. We discussed why this denial may have occurred for full Medicaid.   She is able to make ends meet but it has been harder as she had her SNAP benefits cut before her hours were cut so paying for medications, utilities, food etc can be more difficult. She is on her way to DSS to complete new applications but she was also encouraged to f/u with [facility] to complete a new SNAP app since her hours changed if she isnt able to make it to DSS - pt agreeable. Pt denies any issues getting to and from appointments.   I discussed option for pt to apply for CAFA to help with previously accumulated bills when she just had Medicaid Family Planning. Pt interested in that application, she also requests information about how to reset her MyChart so she can access that- I will send that information. No additional questions/concerns at this time.                                   HRT/VAS Care Coordination     Patients Home Cardiology Office [place] Team Social Worker   Social Worker Name: Valeda Malm, [place] [phone]   Living arrangements for the past 2 months Apartment   Lives with: Self   Patient Current Insurance Coverage Commercial Insurance   Patient Has Concern With [place] Yes   Patient Concerns With Medical Bills did not have insurance until July 1st 2023   Medical Bill Referrals: mailed CAFA for previous outstanding balance   Does Patient Have Prescription Coverage? Yes   Home Assistive Devices/Equipment None   DME Agency NA   HH Agency NA       Social History:  SDOH Screenings   Alcohol Screen: Not on file  Depression (PHQ2-9): Medium Risk (11/16/2021)   Depression (PHQ2-9)    PHQ-2 Score: 14  Financial Resource Strain: Medium Risk (02/13/2022)   Overall Financial Resource Strain (CARDIA)    Difficulty of Paying Living Expenses: Somewhat hard  Food Insecurity: Food Insecurity Present (02/13/2022)   Hunger Vital Sign    Worried About Running Out of Food in the Last Year: Sometimes true    Ran Out of Food in the Last Year: Sometimes true  Housing: Low Risk  (02/13/2022)   Housing    Last Housing Risk Score: 0  Physical Activity: Not on file  Social Connections: Not on file  Stress: Not on file  Tobacco Use: High Risk (01/27/2022)   Patient History    Smoking Tobacco Use: Every Day    Smokeless Tobacco Use: Never    Passive Exposure: Not on file  Transportation Needs: No Transportation Needs (02/13/2022)   PRAPARE - Transportation    Lack of Transportation (Medical): No    Lack of Transportation (Non-Medical): No    SDOH Interventions: Financial Resources:  Sales promotion account executive Interventions:  (FNS card [employer], referral to Financial Counselor re: CAFA, possible Patient [place] assistance)   Food Insecurity:  Food Insecurity Interventions: Other (Comment) (mailed [employer] FNS card)  Housing Insecurity:  Housing Interventions: Other (Comment) (pt mentioned paying on lights bill, no turn off notice, encouraged to let me know if this remains an issue (possibly Patient Care Fund))  Transportation:   Transportation Interventions: Intervention Not Indicated    Other Care Navigation Interventions:     Provided Pharmacy assistance resources  Now has coverage, pt shares little concern around this at this time   Follow-up plan:   I have mailed pt the following: my card, SNAP/FNS card from [employer] and [place] started. I will f/u with pt to ensure she receives this information and to answer any additional questions/returns at that time. I remain available should pt want to contact me.

## 2022-02-14 ENCOUNTER — Telehealth: Payer: Self-pay | Admitting: Cardiology

## 2022-02-14 ENCOUNTER — Other Ambulatory Visit: Payer: Self-pay

## 2022-02-14 MED ORDER — SACUBITRIL-VALSARTAN 97-103 MG PO TABS
1.0000 | ORAL_TABLET | Freq: Two times a day (BID) | ORAL | 3 refills | Status: DC
  Filled 2022-02-14: qty 60, 30d supply, fill #0

## 2022-02-14 NOTE — Telephone Encounter (Signed)
Pt c/o medication issue:  1. Name of Medication: sacubitril-valsartan (ENTRESTO) 97-103 MG  2. How are you currently taking this medication (dosage and times per day)? Take 1 tablet by mouth 2 (two) times daily.  3. Are you having a reaction (difficulty breathing--STAT)? no  4. What is your medication issue? Calling to see if our office had any samples they can get it.

## 2022-02-14 NOTE — Telephone Encounter (Signed)
Returned call to patient, patient reports running out of Entresto last night.  She reports she got notified yesterday she was approved for patient assistance.   No samples available at [facility].   Reached out to PCP.   Per CSW, patient insurance active as of 7/1.  Discussed with pharmacist-with active insurance, Delene Loll will be $15/30 day supply.  Discussed with patient, patient states she is unable to afford this and needs to get it through patient assistance.  Advised to reach out to patient assistance to confirm she was approved, if not-prescription is at pharmacy.  If unable to afford co-pay, please call back to let us know as medications changes will need to be made.  Patient verbalized understanding.

## 2022-02-20 ENCOUNTER — Telehealth: Payer: Self-pay | Admitting: Licensed Clinical Social Worker

## 2022-02-20 NOTE — Telephone Encounter (Signed)
H&V Care Navigation CSW Progress Note  Clinical Social Worker contacted patient by phone to f/u on pt assistance applications sent to pt via mail. I was able to reach pt via telephone at [phone], we discussed working on applications sent and bringing them to the appointment on 8/15 to complete/submit. Pt encouraged to call me before time with any questions/concerns. Pt agreeable to this plan. No additional questions/concerns at this time.   Patient is participating in a Managed Medicaid Plan:  No,   SDOH Screenings   Alcohol Screen: Not on file  Depression (PHQ2-9): Medium Risk (11/16/2021)   Depression (PHQ2-9)    PHQ-2 Score: 14  Financial Resource Strain: Medium Risk (02/13/2022)   Overall Financial Resource Strain (CARDIA)    Difficulty of Paying Living Expenses: Somewhat hard  Food Insecurity: Food Insecurity Present (02/13/2022)   Hunger Vital Sign    Worried About Running Out of Food in the Last Year: Sometimes true    Ran Out of Food in the Last Year: Sometimes true  Housing: Low Risk  (02/13/2022)   Housing    Last Housing Risk Score: 0  Physical Activity: Not on file  Social Connections: Not on file  Stress: Not on file  Tobacco Use: High Risk (01/27/2022)   Patient History    Smoking Tobacco Use: Every Day    Smokeless Tobacco Use: Never    Passive Exposure: Not on file  Transportation Needs: No Transportation Needs (02/13/2022)   PRAPARE - Transportation    Lack of Transportation (Medical): No    Lack of Transportation (Non-Medical): No   Westley Hummer, MSW, [place]  [phone]- work cell phone (preferred) [phone]- desk phone

## 2022-02-21 ENCOUNTER — Other Ambulatory Visit: Payer: Self-pay

## 2022-02-28 ENCOUNTER — Other Ambulatory Visit: Payer: Self-pay

## 2022-03-03 ENCOUNTER — Other Ambulatory Visit: Payer: Self-pay

## 2022-03-13 ENCOUNTER — Telehealth: Payer: Self-pay | Admitting: Licensed Clinical Social Worker

## 2022-03-13 NOTE — Telephone Encounter (Signed)
H&V Care Navigation CSW Progress Note  Clinical Social Worker contacted patient by phone [phone]) to remind pt of appt tomorrow and encourage her to bring applications for [place] to complete. Pt answered, is aware of appt and will bring application. Currently has insurance through [place] but can apply for CAFA for discount for previous bills during the time in which she was not insured- I will make a note on the appointment. No additional questions/concerns from pt at this time.   Patient is participating in a Managed Medicaid Plan:  No, commercial insurance only Psychologist, counselling)  SDOH Screenings   Alcohol Screen: Not on file  Depression (PHQ2-9): High Risk (11/16/2021)   Depression (PHQ2-9)    PHQ-2 Score: 14  Financial Resource Strain: Medium Risk (02/13/2022)   Overall Financial Resource Strain (CARDIA)    Difficulty of Paying Living Expenses: Somewhat hard  Food Insecurity: Food Insecurity Present (02/13/2022)   Hunger Vital Sign    Worried About Running Out of Food in the Last Year: Sometimes true    Ran Out of Food in the Last Year: Sometimes true  Housing: Low Risk  (02/13/2022)   Housing    Last Housing Risk Score: 0  Physical Activity: Not on file  Social Connections: Not on file  Stress: Not on file  Tobacco Use: High Risk (01/27/2022)   Patient History    Smoking Tobacco Use: Every Day    Smokeless Tobacco Use: Never    Passive Exposure: Not on file  Transportation Needs: No Transportation Needs (02/13/2022)   PRAPARE - Transportation    Lack of Transportation (Medical): No    Lack of Transportation (Non-Medical): No    Westley Hummer, MSW, [place]  [phone]- work cell phone (preferred) [phone]- desk phone

## 2022-03-14 ENCOUNTER — Encounter: Payer: Self-pay | Admitting: Physician Assistant

## 2022-03-14 ENCOUNTER — Ambulatory Visit (INDEPENDENT_AMBULATORY_CARE_PROVIDER_SITE_OTHER): Payer: Commercial Managed Care - HMO | Admitting: Physician Assistant

## 2022-03-14 VITALS — BP 94/60 | HR 72 | Ht 69.0 in | Wt 224.8 lb

## 2022-03-14 DIAGNOSIS — R531 Weakness: Secondary | ICD-10-CM | POA: Diagnosis not present

## 2022-03-14 DIAGNOSIS — I2510 Atherosclerotic heart disease of native coronary artery without angina pectoris: Secondary | ICD-10-CM

## 2022-03-14 DIAGNOSIS — I10 Essential (primary) hypertension: Secondary | ICD-10-CM

## 2022-03-14 DIAGNOSIS — I5042 Chronic combined systolic (congestive) and diastolic (congestive) heart failure: Secondary | ICD-10-CM

## 2022-03-14 DIAGNOSIS — R42 Dizziness and giddiness: Secondary | ICD-10-CM | POA: Diagnosis not present

## 2022-03-14 DIAGNOSIS — E785 Hyperlipidemia, unspecified: Secondary | ICD-10-CM

## 2022-03-14 DIAGNOSIS — E119 Type 2 diabetes mellitus without complications: Secondary | ICD-10-CM

## 2022-03-14 NOTE — Progress Notes (Unsigned)
H&V Care Navigation CSW Progress Note  Clinical Social Worker met with patient to f/u on assistance application. Pt now has insurance Psychologist, counselling) but has multiple bills that may qualify for [employer]  program. Pt had not started her portion of application but brought envelope and application as requested to appt today. I was able to assist with completing and having her sign application. Pt understands she needs to gather additional supporting documents and we circled what is needed. Pt was given this list and I ensured per her report she is able to read and understand what is needed. Encouraged to f/u with me should additional questions arise. I will f/u to answer any additional questions and ensure documents are brought to office so we can submit application. No additional questions/concerns noted for this writer, pt currently shares she does not feel well, having neck pain and appears unwell. Provider to see her, this Probation officer ensured she has a ride home (pt daughter brought her).   Patient is participating in a Managed Medicaid Plan:  No, commercial insurance.  SDOH Screenings   Alcohol Screen: Not on file  Depression (PHQ2-9): High Risk (11/16/2021)   Depression (PHQ2-9)    PHQ-2 Score: 14  Financial Resource Strain: Medium Risk (02/13/2022)   Overall Financial Resource Strain (CARDIA)    Difficulty of Paying Living Expenses: Somewhat hard  Food Insecurity: Food Insecurity Present (02/13/2022)   Hunger Vital Sign    Worried About Running Out of Food in the Last Year: Sometimes true    Ran Out of Food in the Last Year: Sometimes true  Housing: Low Risk  (02/13/2022)   Housing    Last Housing Risk Score: 0  Physical Activity: Not on file  Social Connections: Not on file  Stress: Not on file  Tobacco Use: High Risk (03/14/2022)   Patient History    Smoking Tobacco Use: Every Day    Smokeless Tobacco Use: Never    Passive Exposure: Not on file  Transportation Needs: No  Transportation Needs (02/13/2022)   PRAPARE - Transportation    Lack of Transportation (Medical): No    Lack of Transportation (Non-Medical): No   Westley Hummer, MSW, [place]  [phone]- work cell phone (preferred) [phone]- desk phone

## 2022-03-14 NOTE — Progress Notes (Unsigned)
Cardiology Office Note:    Date:  03/15/2022   ID:  Amy Chaney, DOB [birth year], MRN [id number]  PCP:  [name], [place] Prov[facility] Cardiologist:  Glenetta Hew, MD     Referring MD: [name], MD   Chief Complaint  Patient presents with   Follow-up    Seen for Dr. Ellyn Hack    History of Present Illness:    Amy Chaney is a 51 y.o. female with a hx of CAD, ischemic cardiomyopathy, chronic combined systolic and diastolic heart failure, hyperlipidemia, hypertension, DM2 and chronic tobacco abuse.  Patient had abnormal Myoview in July 2014 and subsequently underwent DES PCI of proximal to mid RCA with 2 overlapping drug-eluting stents by Dr. Einar Gip.  She had another NSTEMI in September 2014 after stopping Effient for 4 days, this resulted in 100% subacute stent thrombosis of RCA stent, this was treated with distal overlapping drug-eluting stent.  She had anterior STEMI in March 2019 involve the bifurcation of D2 and LAD that was treated with drug-eluting stent.  She unfortunately had another an anterior STEMI in December 2019 with subacute stent thrombosis treated with DES to distal LAD.  EF was 35 to 40% the time.  Patient was admitted for flash pulmonary edema in May 2020, ejection fraction was 20 to 25% with signs of Takotsubo cardiomyopathy.  This occurred in the setting of her son's death.  She was started on carvedilol, Entresto and spironolactone.  Repeat echocardiogram in July 2020 shows EF has improved to 45 to 50%, moderate LVH, grade 1 DD, severe apical akinesis.  Patient was admitted again in April 2021 due to flash pulmonary edema and acute respiratory failure.  EF was down to 20 to 25% again on echocardiogram.  Left and right heart catheterization performed on 11/04/2019 showed 100% occluded ostial D2, patent stent in the LAD, 50% OM 2 lesion, 40% mid to distal RCA lesion.  Coronary anatomy was relatively stable, it was suspected that her reduced  ejection fraction was due to hypertensive urgency.  Patient was admitted in December 2021 due to bilateral pyelonephritis and E. coli bacteremia.  She was discharged on antibiotic. Repeat echocardiogram obtained on 03/10/2021 showed EF essentially normalized at 50 to 55%, akinesis of the apex, normal RV size, mild MR.  I last saw the patient on 01/25/2022 at which time she described a bubbly sensation in her chest which last only last a few seconds at a time and typically occurs while she is at rest.  She was climbing stairs recently without any exertional symptoms and worsening dyspnea.  Given brief episode with this symptom, I recommended continue observation.  She has had her symptom occurs about 1-2 times per week.  She also describe episode of passing out spell that occurred at the clients house in the beginning of June.  She says she got up to walk across the hallway when she started having significant dizziness and subsequently passed out for 5 minutes.  She did not seek medical attention at the time.  I recommended a heart monitor for 2 weeks.  Blood work obtained a month ago showed improved renal function.  I did reduce the spironolactone down to 25 mg daily for the previous twice daily dosing.  Patient presents today for follow-up.  She appears to be quite weak today.  She says she has been feeling like this every other day.  She has dizziness with sitting up.  We rechecked her blood pressure, her blood  pressure was 94/50.  Upon sitting up, her blood pressure is actually a little bit higher at 104/66.  I was quite concerned about her fatigue as the patient had her eyes closed and was sleeping down while sitting in wheelchair for majority of the visit.  I decided to hold her hydralazine.  She denies any chest pain or significant shortness of breath.  We instructed the patient to drink more fluid when she get home, it is possible she is dehydrated.  We will obtain CMP, CBC, and BNP.  She is aware to call  911 and go to the nearest ED if her symptom worsens.  I plan to see the patient back in 3 days.   Past Medical History:  Diagnosis Date   Coronary artery disease involving native coronary artery of native heart with angina pectoris ([place]) 01/2013   a. s/p PCI of the mid RCA 01/2013 //  b. LHC 9/14: EF 55%, RCA stent 100% occluded, LAD irregularities >>  subacute stent thrombosis, Promus DES PCI distal overlap // c) 09/2017 - Ant STEMI - LAD-D2 PCI - Successful PCI of LAD (3 overlapping DES), unable to restore flow down D2l that was stented as well.  Likely related to downstream dissection, but unable to rewire.   Daily headache    Depression    Dyslipidemia, goal LDL below 70    History of Doppler ultrasound    carotid bruit >> a. 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  EF down to 20-25% global hypokinesis.  Was in the setting of grieving over the loss of her son from MI.  ->  EF improved to 45-50% on follow-up echocardiogram.   Hypertension    Ischemic cardiomyopathy 06/2018   s/p inferior STEMI followed by 2 anterior STEMIs: Following recovery from Takotsubo Cardiomyopathy- Echo 02/14/19: Moderate LVH.  EF improved up to 45-50%.  GR 1 DD.  Severe apical akinesis.  Normal valves.   Mild dysplasia of cervix ([place]) 02/04/2019   Seen after colposcopy on 01/23/19 done for ASCU +HPV pap  > repeat pap and HPV test in 12 and 24 months   STEMI involving left anterior descending coronary artery ([place]) 09/2017; 06/2018   a) Severe [place] 1, 1, 1 LAD-D2 lesion (complicated by post PTCA dissection/intramural hematoma) -successful extensive PCI of the LAD but unable to maintain patency of the stented D2. b) distal mLAD stent Edge 100% thrombosis --> PTCA & Overlapping DES PCI (Synergy 3 x 12 --> 3.6-3.3 mm). D2 occluded. RCA stents patent, 65 % OM2. EF 30-35%.   STEMI involving oth coronary artery  of inferior wall ([place]) 03/2013   Secondary to subacute stent thrombosis of RCA stent segment having missed 4 doses of Effient.   Tobacco abuse 01/11/2016   Type II diabetes mellitus ([place]) 12/2010   sister and son also diabetic     Past Surgical History:  Procedure Laterality Date   CHOLECYSTECTOMY  ~ 2000   CORONARY STENT INTERVENTION N/A 10/10/2017   Procedure: CORONARY STENT INTERVENTION;  Surgeon: Leonie Man, MD;  Location: [place] CV LAB;  Service: Cardiovascular; LAD PCI: STENT SYNERGY DES 3.5X32 (p),STENT SYNERGY DES 3.5X 8 (m), STENT SYNERGY DES 3.5X28 (d).  D2 PCI: STENT SYNERGY DES 2.5X24.  (UNABL TO REWIRE & EXPAND - TO OF DIAG AT END OF CASE)   CORONARY STENT INTERVENTION N/A 07/03/2018   Procedure: CORONARY STENT  INTERVENTION;  Surgeon: Leonie Man, MD;  [facility]: [facility] IN[facility]ASIVE [facility] LAB;; PTCA & overlapping DES PCI (overlaps prior stents distally) - Synergy DES 3 x 12 (3.6 mm @ overlap -- 3.3 mm distally)>   LEFT HEART CATH AND CORONARY ANGIOGRAPHY N/A 10/10/2017   Procedure: LEFT HEART CATH AND CORONARY ANGIOGRAPHY;  Surgeon: Leonie Man, MD;  Location: [place] CV LAB;  Service: Cardiovascular;  Laterality: N/A; -patent RCA stents with mild in-stent restenosis. [place] 1,1,1 mLD-D2 43% (complicated by dissection/intramural thrombus)   LEFT HEART CATH AND CORONARY ANGIOGRAPHY N/A 07/03/2018   Procedure: LEFT HEART CATH AND CORONARY ANGIOGRAPHY;  Surgeon: Leonie Man, MD;  Location: [place] CV LAB;; 100% thrombotic occlusion distal LAD stent -- DES PCI. continued 100% D2 (previously lost). patent RCA stents, 65% OM2.  EF 30-35%.   LEFT HEART CATHETERIZATION WITH CORONARY ANGIOGRAM N/A 02/25/2013   Procedure: LEFT HEART CATHETERIZATION WITH CORONARY ANGIOGRAM;  Surgeon: Laverda Page, MD;  Location: [facility] CATH LAB;  Service: Cardiovascular;; CTO m RCA; otherwise normal coronaries   LEFT HEART CATHETERIZATION WITH CORONARY ANGIOGRAM N/A 04/01/2013   Procedure: LEFT  HEART CATHETERIZATION WITH CORONARY ANGIOGRAM;  Surgeon: Laverda Page, MD;  Location: [facility] CATH LAB;  Service: Cardiovascular: 100% occlusion of distal RCA stent (subacute stent thrombosis -  secondary to stopping Effient).  Overlapping DES PCI   NM MYOVIEW LTD  12/2015    EF 38%.  Hypertensive response to exercise (231/132 mmHg).  INTERMEDIATE RISK due to -diffuse hypokinesis and reduced EF.  No ischemia or infarction.   PERCUTANEOUS CORONARY STENT INTERVENTION (PCI-S)  04/01/2013   Procedure: PERCUTANEOUS CORONARY STENT INTERVENTION (PCI-S);  Surgeon: Laverda Page, MD;  Location: [facility] CATH LAB;  Service: Cardiovascular;; PCI of distal RCA stent subacute thrombosis: Promus Premier DES 3.0 mm x 20 mm.   PERCUTANEOUS CORONARY STENT INTERVENTION (PCI-S)  02/25/2013   Procedure: PERCUTANEOUS CORONARY STENT INTERVENTION (PCI-S);  Surgeon: Laverda Page, MD;  Location: [facility] CATH LAB;  RCA CTO PCI with overlapping Promus DES: 3.0 mm x 38 mm, 3.5 mm x 4m   RIGHT/LEFT HEART CATH AND CORONARY ANGIOGRAPHY N/A 11/04/2019   Procedure: RIGHT/LEFT HEART CATH AND CORONARY ANGIOGRAPHY;  Surgeon: HLeonie Man MD;  [facility];; Mildly elevated LVEDP.  Ostial D1 100%.  Proximal to mid LAD stent 10% ISR.  Mid and distal stent widely patent.  30% distal distance.  Ostial OM 2 55%.  Proximal to mid RCA stent 50% stenosis.  Mid to distal RCA 40%.  Suspect that reduced EF is related to hypertensive emergen   TRANSTHORACIC ECHOCARDIOGRAM  07/04/2018   recurrent Anterior STEMI: EF 35-40%.  Apical hypokinesis.  GRII DD.  No thrombus noted.  (Improved EF from 30% up to 35-40% from previous echo)   TRANSTHORACIC ECHOCARDIOGRAM  11/2018; 01/2019   a) TAKOTUSBO CM: EF 20-25%-diffuse HK..  Mod V thickness.  GRII DD w/ high LVEDP.  Severe LA dilation;; b) 02/14/19: Moderate LVH.  EF improved up to 45-50%.  GR 1 DD.  Severe apical akinesis.  Normal valves.   TRANSTHORACIC ECHOCARDIOGRAM  10/31/2019    Hypertensive emergency, hypoxic/hypercapnic respiratory failure;  EF 20 to 25%.  Global HK.  Mildly dilated LV.  Mildly elevated PA pressures.  Mild LA dilation.  Mild AR.  Mildly elevated RAP-8 mmHg.=> Eventual cath showed no change   TUBAL LIGATION  1994    Current Medications: Current Meds  Medication Sig   blood glucose meter kit and supplies  Dispense based on patient and insurance preference. Use up to four times daily as directed. (FOR ICD-10 E10.9, E11.9).   Blood Glucose Monitoring Suppl (TRUE METRIX METER) DEVI 1 kit by Does not apply route 4 (four) times daily.   carvedilol (COREG) 25 MG tablet Take 1 tablet (25 mg total) by mouth 2 (two) times daily.   clopidogrel (PLAVIX) 75 MG tablet Take 1 tablet (75 mg total) by mouth daily.   DULoxetine (CYMBALTA) 60 MG capsule Take 1 capsule (60 mg total) by mouth daily. For diabetic neuropathy   Evolocumab (REPATHA SURECLICK) 409 MG/ML SOAJ Inject 140 mg into the skin every 14 (fourteen) days. Inject one pen into the subcutaneous tissue every 14 days   furosemide (LASIX) 20 MG tablet Take 1 tablet (20 mg total) by mouth 2 (two) times daily.   gabapentin (NEURONTIN) 300 MG capsule Take 1 capsule (300 mg total) by mouth 3 (three) times daily.   glipiZIDE (GLUCOTROL) 10 MG tablet TAKE 1 TABLET (10 MG TOTAL) BY MOUTH 2 (TWO) TIMES DAILY BEFORE A MEAL.   hydrALAZINE (APRESOLINE) 100 MG tablet TAKE 1 TABLET (100 MG TOTAL) BY MOUTH 3 (THREE) TIMES DAILY.   Insulin Glargine (BASAGLAR KWIKPEN) 100 UNIT/ML Inject 25 Units into the skin 2 (two) times daily.   Insulin Pen Needle (TECHLITE PEN NEEDLES) 32G X 4 MM MISC use 1 pen needle with basaglar once daily   Multiple Vitamins-Minerals (WOMENS 50+ MULTI VITAMIN PO) Take 1 tablet by mouth daily.   rosuvastatin (CRESTOR) 40 MG tablet Take 1 tablet (40 mg total) by mouth daily.   sacubitril-valsartan (ENTRESTO) 97-103 MG Take 1 tablet by mouth 2 (two) times daily.   Semaglutide,0.25 or 0.5MG/DOS, (OZEMPIC,  0.25 OR 0.5 MG/DOSE,) 2 MG/1.5ML SOPN Inject 0.25 mg into the skin once a week.   spironolactone (ALDACTONE) 25 MG tablet Take 1 tablet (25 mg total) by mouth daily.   TRUEPLUS LANCETS 28G MISC 28 g by Does not apply route 4 (four) times daily.     Allergies:   [place] [dapagliflozin]   Social History   Socioeconomic History   Marital status: Single    Spouse name: Not on file   Number of children: 3   Years of education: Not on file   Highest education level: 11th grade  Occupational History    Employer: [employer]  Tobacco Use   Smoking status: Every Day    Packs/day: 0.25    Years: 22.00    Total pack years: 5.50    Types: Cigarettes   Smokeless tobacco: Never   Tobacco comments:    current smoker 4-5 cigarettes per day; previously smoked at least a pack a day.  Vaping Use   Vaping Use: Never used  Substance and Sexual Activity   Alcohol use: Yes    Alcohol/week: 0.0 standard drinks of alcohol    Comment: 07/04/2018 "only on special occasions; maybe once/yr"   Drug use: Yes    Types: Marijuana    Comment: 02/25/2013 "quit marijuana ~ 2001"   Sexual activity: Not Currently    Birth control/protection: Surgical  Other Topics Concern   Not on file  Social History Narrative   Diet: 50/50 take out and home cook meals (eats burgers and lots of fried foods), limits portions.       Exercise: walks 15-20 minutes twice per week   Social Determinants of Health   Financial Resource Strain: Medium Risk (02/13/2022)   Overall Financial Resource Strain (CARDIA)    Difficulty of  Paying Living Expenses: Somewhat hard  Food Insecurity: Food Insecurity Present (02/13/2022)   Hunger Vital Sign    Worried About Running Out of Food in the Last Year: Sometimes true    Ran Out of Food in the Last Year: Sometimes true  Transportation Needs: No Transportation Needs (02/13/2022)   PRAPARE - Hydrologist (Medical): No    Lack of Transportation (Non-Medical):  No  Physical Activity: Not on file  Stress: Not on file  Social Connections: Not on file     Family History: The patient's family history includes Diabetes in her mother, sister, sister, and son.  ROS:   Please see the history of present illness.     All other systems reviewed and are negative.  EKGs/Labs/Other Studies Reviewed:    The following studies were reviewed today:  Echo 03/10/2021  1. Apical akinesis with overall low normal LV function.   2. Left ventricular ejection fraction, by estimation, is 50 to 55%. The  left ventricle has low normal function. The left ventricle demonstrates  regional wall motion abnormalities (see scoring diagram/findings for  description). There is mild left  ventricular hypertrophy. Left ventricular diastolic parameters are  consistent with Grade II diastolic dysfunction (pseudonormalization).  Elevated left atrial pressure.   3. Right ventricular systolic function is normal. The right ventricular  size is normal. There is normal pulmonary artery systolic pressure.   4. Left atrial size was moderately dilated.   5. The mitral valve is normal in structure. Mild mitral valve  regurgitation. No evidence of mitral stenosis.   6. The aortic valve is tricuspid. Aortic valve regurgitation is not  visualized. No aortic stenosis is present.   7. The inferior vena cava is normal in size with greater than 50%  respiratory variability, suggesting right atrial pressure of 3 mmHg.   EKG:  EKG is ordered today.  The ekg ordered today demonstrates normal sinus rhythm, no significant ischemic changes  Recent Labs: 10/18/2021: ALT 12 11/21/2021: Hemoglobin 11.2; Platelets 298; TSH 0.648 01/25/2022: BUN 17; Creatinine, Ser 1.30; Potassium 3.7; Sodium 139  Recent Lipid Panel    Component Value Date/Time   CHOL 113 10/18/2021 1057   TRIG 79 10/18/2021 1057   HDL 48 10/18/2021 1057   CHOLHDL 2.4 10/18/2021 1057   CHOLHDL 4.2 07/05/2018 0330   VLDL 15  07/05/2018 0330   LDLCALC 49 10/18/2021 1057     Risk Assessment/Calculations:           Physical Exam:    VS:  BP 94/60 (BP Location: Left Arm, Patient Position: Sitting, Cuff Size: Large)   Pulse 72   Ht 5' 9"  (1.753 m)   Wt 224 lb 12.8 oz (102 kg)   LMP 10/20/2015   SpO2 97%   BMI 33.20 kg/m         Wt Readings from Last 3 Encounters:  03/14/22 224 lb 12.8 oz (102 kg)  01/25/22 227 lb 3.2 oz (103.1 kg)  11/16/21 225 lb (102.1 kg)     GEN: Sleepy, weak.  Sitting in wheelchair HEENT: Normal NECK: No JVD; No carotid bruits LYMPHATICS: No lymphadenopathy CARDIAC: RRR, no murmurs, rubs, gallops RESPIRATORY:  Clear to auscultation without rales, wheezing or rhonchi  ABDOMEN: Soft, non-tender, non-distended MUSCULOSKELETAL:  No edema; No deformity  SKIN: Warm and dry NEUROLOGIC:  Alert and oriented x 3 PSYCHIATRIC:  Normal affect   ASSESSMENT:    1. Weakness   2. Dizziness   3. Coronary artery disease  involving native coronary artery of native heart without angina pectoris   4. Chronic combined systolic (congestive) and diastolic (congestive) heart failure ([place])   5. Primary hypertension   6. Hyperlipidemia LDL goal <70   7. Controlled type 2 diabetes mellitus without complication, without long-term current use of insulin (HCC)    PLAN:    In order of problems listed above:  Weakness: Patient has been complaining of intermittent weakness.  On arrival, she was sitting in wheelchair with her eyes closed.  She appears to be extremely weak compared to the last time I saw her.  I previously cut back her spironolactone down to 25 mg daily from the previous 25 mg twice a day.  However her blood pressure is clearly low today.  We set her up and I rechecked her blood pressure, she did not have significant drop in her systolic blood pressure with body position changes.  However I am still quite worried about dehydration.  I ended up talking with Dr. Phineas Inches, DOD about her  case, Dr. Harl Bowie has also seen the patient in the office today.  We recommended her start holding the hydralazine.  She will need CMP, CBC and BNP today.  We instructed the patient to drink more fluid after she get home.  She is aware that if her symptom does not improve, we would recommend she seek urgent medical attention at the local emergency room by calling 911.  I would have very low threshold of sending the patient to the ED if her overall symptom does not improve or her lab work come back severely abnormal.  I have called and spoke with her daughter personally regarding her overall condition, her daughter is going to try to bring the patient over to her home today to keep a close eye on her.  Dizziness: Likely related to low blood pressure.  Instructed the patient to drink more fluid   CAD: Denies any recent chest pain  Chronic combined systolic and diastolic heart failure: Appears to be euvolemic on exam  Hypertension: Systolic blood pressure was 94 mmHg on arrival.  I decided to stop her hydralazine  Hyperlipidemia: On Repatha and Crestor.  Obtain CMP  DM2: Managed by primary care provider.           Medication Adjustments/Labs and Tests Ordered: Current medicines are reviewed at length with the patient today.  Concerns regarding medicines are outlined above.  Orders Placed This Encounter  Procedures   Comprehensive Metabolic Panel (CMET)   CBC w/Diff/Platelet   B Nat Peptide   EKG 12-Lead   No orders of the defined types were placed in this encounter.   Patient Instructions  Medication Instructions:  Your physician has recommended you make the following change in your medication:  HOLD: Hydralazine *If you need a refill on your cardiac medications before your next appointment, please call your pharmacy*   Lab Work: BNP,  CBC with Differential, and CMET  If you have labs (blood work) drawn today and your tests are completely normal, you will receive your results only  by: [place] (if you have MyChart) OR A paper copy in the mail If you have any lab test that is abnormal or we need to change your treatment, we will call you to review the results.   Testing/Procedures: NONE   Follow-Up: At [facility], you and your health needs are our priority.  As part of our continuing mission to provide you with exceptional heart care, we have created  designated Provider Care Teams.  These Care Teams include your primary Cardiologist (physician) and Advanced Practice Providers (APPs -  Physician Assistants and Nurse Practitioners) who all work together to provide you with the care you need, when you need it.  We recommend signing up for the patient portal called "MyChart".  Sign up information is provided on this After Visit Summary.  MyChart is used to connect with patients for Virtual Visits (Telemedicine).  Patients are able to view lab/test results, encounter notes, upcoming appointments, etc.  Non-urgent messages can be sent to your provider as well.   To learn more about what you can do with MyChart, go to [email or web address].    Your next appointment:   3 day(s)  The format for your next appointment:   In Person  Provider:   Almyra Deforest, PA-C    {   Signed, Almyra Deforest, [place]  03/15/2022 12:35 AM    [place]

## 2022-03-14 NOTE — Patient Instructions (Signed)
Medication Instructions:  Your physician has recommended you make the following change in your medication:  HOLD: Hydralazine *If you need a refill on your cardiac medications before your next appointment, please call your pharmacy*   Lab Work: BNP,  CBC with Differential, and CMET  If you have labs (blood work) drawn today and your tests are completely normal, you will receive your results only by: [place] (if you have MyChart) OR A paper copy in the mail If you have any lab test that is abnormal or we need to change your treatment, we will call you to review the results.   Testing/Procedures: NONE   Follow-Up: At [facility], you and your health needs are our priority.  As part of our continuing mission to provide you with exceptional heart care, we have created designated Provider Care Teams.  These Care Teams include your primary Cardiologist (physician) and Advanced Practice Providers (APPs -  Physician Assistants and Nurse Practitioners) who all work together to provide you with the care you need, when you need it.  We recommend signing up for the patient portal called "MyChart".  Sign up information is provided on this After Visit Summary.  MyChart is used to connect with patients for Virtual Visits (Telemedicine).  Patients are able to view lab/test results, encounter notes, upcoming appointments, etc.  Non-urgent messages can be sent to your provider as well.   To learn more about what you can do with MyChart, go to [email or web address].    Your next appointment:   3 day(s)  The format for your next appointment:   In Person  Provider:   Almyra Deforest, PA-C    {

## 2022-03-15 ENCOUNTER — Encounter: Payer: Self-pay | Admitting: Physician Assistant

## 2022-03-15 LAB — COMPREHENSIVE METABOLIC PANEL
ALT: 11 IU/L (ref 0–32)
AST: 16 IU/L (ref 0–40)
Albumin/Globulin Ratio: 1.2 (ref 1.2–2.2)
Albumin: 4.4 g/dL (ref 3.8–4.9)
Alkaline Phosphatase: 50 IU/L (ref 44–121)
BUN/Creatinine Ratio: 13 (ref 9–23)
BUN: 24 mg/dL (ref 6–24)
Bilirubin Total: 0.5 mg/dL (ref 0.0–1.2)
CO2: 26 mmol/L (ref 20–29)
Calcium: 10 mg/dL (ref 8.7–10.2)
Chloride: 92 mmol/L — ABNORMAL LOW (ref 96–106)
Creatinine, Ser: 1.83 mg/dL — ABNORMAL HIGH (ref 0.57–1.00)
Globulin, Total: 3.7 g/dL (ref 1.5–4.5)
Glucose: 117 mg/dL — ABNORMAL HIGH (ref 70–99)
Potassium: 3.1 mmol/L — ABNORMAL LOW (ref 3.5–5.2)
Sodium: 135 mmol/L (ref 134–144)
Total Protein: 8.1 g/dL (ref 6.0–8.5)
eGFR: 33 mL/min/{1.73_m2} — ABNORMAL LOW (ref >59)

## 2022-03-15 LAB — CBC WITH DIFFERENTIAL/PLATELET
Basophils Absolute: 0.1 10*3/uL (ref 0.0–0.2)
Basos: 1 %
EOS (ABSOLUTE): 0.4 10*3/uL (ref 0.0–0.4)
Eos: 3 %
Hematocrit: 35.6 % (ref 34.0–46.6)
Hemoglobin: 11.8 g/dL (ref 11.1–15.9)
Immature Grans (Abs): 0.1 10*3/uL (ref 0.0–0.1)
Immature Granulocytes: 1 %
Lymphocytes Absolute: 3.1 10*3/uL (ref 0.7–3.1)
Lymphs: 24 %
MCH: 26.6 pg (ref 26.6–33.0)
MCHC: 33.1 g/dL (ref 31.5–35.7)
MCV: 80 fL (ref 79–97)
Monocytes Absolute: 0.8 10*3/uL (ref 0.1–0.9)
Monocytes: 6 %
Neutrophils Absolute: 8.8 10*3/uL — ABNORMAL HIGH (ref 1.4–7.0)
Neutrophils: 65 %
Platelets: 367 10*3/uL (ref 150–450)
RBC: 4.43 x10E6/uL (ref 3.77–5.28)
RDW: 13 % (ref 11.7–15.4)
WBC: 13.3 10*3/uL — ABNORMAL HIGH (ref 3.4–10.8)

## 2022-03-15 LAB — BRAIN NATRIURETIC PEPTIDE: BNP: 10.8 pg/mL (ref 0.0–100.0)

## 2022-03-15 NOTE — Progress Notes (Signed)
Sign of dehydration with sightly worsening renal function, stop lasix for 3 days before restarting at a lower dose of '20mg'$  daily. No sign of volume overload. Potassium low, start on 10 meq daily of KCl supplement. White blood cell elevated, monitor for sign of infection such as fever or chill, if symptom worsens, go to the emergency room as discussed during office visit.

## 2022-03-17 ENCOUNTER — Other Ambulatory Visit: Payer: Self-pay

## 2022-03-17 ENCOUNTER — Encounter: Payer: Self-pay | Admitting: Physician Assistant

## 2022-03-17 ENCOUNTER — Ambulatory Visit (INDEPENDENT_AMBULATORY_CARE_PROVIDER_SITE_OTHER): Payer: Commercial Managed Care - HMO | Admitting: Physician Assistant

## 2022-03-17 VITALS — BP 110/76 | HR 68 | Ht 69.0 in | Wt 227.0 lb

## 2022-03-17 DIAGNOSIS — N179 Acute kidney failure, unspecified: Secondary | ICD-10-CM

## 2022-03-17 DIAGNOSIS — R531 Weakness: Secondary | ICD-10-CM

## 2022-03-17 DIAGNOSIS — I2510 Atherosclerotic heart disease of native coronary artery without angina pectoris: Secondary | ICD-10-CM | POA: Diagnosis not present

## 2022-03-17 DIAGNOSIS — I5042 Chronic combined systolic (congestive) and diastolic (congestive) heart failure: Secondary | ICD-10-CM

## 2022-03-17 DIAGNOSIS — I255 Ischemic cardiomyopathy: Secondary | ICD-10-CM

## 2022-03-17 DIAGNOSIS — I110 Hypertensive heart disease with heart failure: Secondary | ICD-10-CM | POA: Diagnosis not present

## 2022-03-17 DIAGNOSIS — E119 Type 2 diabetes mellitus without complications: Secondary | ICD-10-CM

## 2022-03-17 DIAGNOSIS — E785 Hyperlipidemia, unspecified: Secondary | ICD-10-CM

## 2022-03-17 MED ORDER — FUROSEMIDE 20 MG PO TABS
20.0000 mg | ORAL_TABLET | ORAL | 1 refills | Status: DC | PRN
  Filled 2022-03-17: qty 30, 30d supply, fill #0
  Filled 2022-04-20: qty 30, 30d supply, fill #1

## 2022-03-17 MED ORDER — SPIRONOLACTONE 25 MG PO TABS
12.5000 mg | ORAL_TABLET | Freq: Every day | ORAL | 1 refills | Status: DC
  Filled 2022-03-17: qty 15, 30d supply, fill #0
  Filled 2022-04-20: qty 15, 30d supply, fill #1
  Filled 2022-05-19: qty 15, 30d supply, fill #2
  Filled 2022-06-26: qty 15, 30d supply, fill #3
  Filled 2022-07-21: qty 15, 30d supply, fill #4
  Filled 2022-08-03 – 2022-08-18 (×2): qty 15, 30d supply, fill #5
  Filled 2022-09-01 (×3): qty 15, 30d supply, fill #6

## 2022-03-17 MED ORDER — SACUBITRIL-VALSARTAN 49-51 MG PO TABS
1.0000 | ORAL_TABLET | Freq: Two times a day (BID) | ORAL | 3 refills | Status: DC
  Filled 2022-03-17: qty 60, 30d supply, fill #0

## 2022-03-17 NOTE — Patient Instructions (Addendum)
Medication Instructions:  CHANGE how you take Lasix 20 mg - use only as needed  DECREASE Spironolactone to 12.5 mg daily   STOP Hydralazine   HOLD Entresto Saturday and Sunday- restart ENTRESTO 49-51 mg twice daily.   *If you need a refill on your cardiac medications before your next appointment, please call your pharmacy*   Lab Work: CBC, BMET today   If you have labs (blood work) drawn today and your tests are completely normal, you will receive your results only by: [place] (if you have MyChart) OR A paper copy in the mail If you have any lab test that is abnormal or we need to change your treatment, we will call you to review the results.   Follow-Up: At [facility], you and your health needs are our priority.  As part of our continuing mission to provide you with exceptional heart care, we have created designated Provider Care Teams.  These Care Teams include your primary Cardiologist (physician) and Advanced Practice Providers (APPs -  Physician Assistants and Nurse Practitioners) who all work together to provide you with the care you need, when you need it.  We recommend signing up for the patient portal called "MyChart".  Sign up information is provided on this After Visit Summary.  MyChart is used to connect with patients for Virtual Visits (Telemedicine).  Patients are able to view lab/test results, encounter notes, upcoming appointments, etc.  Non-urgent messages can be sent to your provider as well.   To learn more about what you can do with MyChart, go to [email or web address].    Your next appointment:   10 day(s)  The format for your next appointment:   In Person  Provider:   Almyra Deforest, PA-C

## 2022-03-17 NOTE — Progress Notes (Unsigned)
Cardiology Office Note:    Date:  03/19/2022   ID:  Amy Chaney, DOB 1970-08-26, MRN [id number]  PCP:  [name], [place] Providers Cardiologist:  Glenetta Hew, MD     Referring MD: [name], MD   Chief Complaint  Patient presents with   Follow-up    Seen for Dr. Ellyn Hack    History of Present Illness:    Amy Chaney is a 51 y.o. female with a hx of CAD, ICM, chronic combined systolic and diastolic heart failure, HTN, HLD, DM2 and chronic tobacco abuse.  Patient had abnormal Myoview in July 2014 and subsequently underwent DES PCI of proximal to mid RCA with 2 overlapping drug-eluting stents by Dr. Einar Gip.  She had another NSTEMI in September 2014 after stopping Effient for 4 days, this resulted in 100% subacute stent thrombosis of RCA stent, this was treated with distal overlapping drug-eluting stent.  She had anterior STEMI in March 2019 involve the bifurcation of D2 and LAD that was treated with drug-eluting stent.  She unfortunately had another an anterior STEMI in December 2019 with subacute stent thrombosis treated with DES to distal LAD.  EF was 35 to 40% the time.  Patient was admitted for flash pulmonary edema in May 2020, ejection fraction was 20 to 25% with signs of Takotsubo cardiomyopathy.  This occurred in the setting of her son's death.  She was started on carvedilol, Entresto and spironolactone.  Repeat echocardiogram in July 2020 shows EF has improved to 45 to 50%, moderate LVH, grade 1 DD, severe apical akinesis.  Patient was admitted again in April 2021 due to flash pulmonary edema and acute respiratory failure.  EF was down to 20 to 25% again on echocardiogram.  Left and right heart catheterization performed on 11/04/2019 showed 100% occluded ostial D2, patent stent in the LAD, 50% OM 2 lesion, 40% mid to distal RCA lesion.  Coronary anatomy was relatively stable, it was suspected that her reduced ejection fraction was due to hypertensive  urgency.  Patient was admitted in December 2021 due to bilateral pyelonephritis and E. coli bacteremia.  She was discharged on antibiotic. Repeat echocardiogram obtained on 03/10/2021 showed EF essentially normalized at 50 to 55%, akinesis of the apex, normal RV size, mild MR.  I saw the patient in June 2023 at which time she described a bubbly sensation in her chest which lasted a few seconds at a time and that typically occurred while she is at rest.  She was climbing stairs without exertion.  Given brief episode with this symptom, I recommended continue observation.  She also described an episode of passing out spell that occurred while at the clients house in the beginning of June, she says she got up to walk across the hallway where she had a significant dizziness and passed out for 5 minutes.  I read used her spironolactone from 25 mg twice a day to 25 mg daily.  I obtained a heart monitor that showed minimal heart rate 46, maximal heart rate 119, average heart rate 67.  Predominantly showed sinus rhythm with 2 SVT, longest episode 17 beats with heart rate of 119 bpm, PVC burden less than 1%.  I saw the patient on 03/14/2022 for follow-up, she was quite weak.  She was dizzy with systolic blood pressure of 94/50.  I recommended she stop hydralazine completely.  I obtained blood work that showed potassium 3.1, creatinine 1.83, normal BMP, white blood cell count 13.3.  Amy Chaney presents today for follow-up.  She looks much better compared to when I last saw her on Tuesday.  Left arm pressure was 110/72, right arm pressure was 110/76.  She has been holding hydralazine.  I will discontinue hydralazine at this time.  I decided to reduce her spironolactone to 12.5 mg daily and reduce Entresto to 49-51 mg twice a day.  I also changed her Lasix to 20 mg as needed for leg swelling and worsening dyspnea.  She will need a 1 week basic metabolic panel and a CBC.  Otherwise, I plan to see her back in 10 days.  Past  Medical History:  Diagnosis Date   Coronary artery disease involving native coronary artery of native heart with angina pectoris ([place]) 01/2013   a. s/p PCI of the mid RCA 01/2013 //  b. LHC 9/14: EF 55%, RCA stent 100% occluded, LAD irregularities >>  subacute stent thrombosis, Promus DES PCI distal overlap // c) 09/2017 - Ant STEMI - LAD-D2 PCI - Successful PCI of LAD (3 overlapping DES), unable to restore flow down D2l that was stented as well.  Likely related to downstream dissection, but unable to rewire.   Daily headache    Depression    Dyslipidemia, goal LDL below 70    History of Doppler ultrasound    carotid bruit >> a. 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  EF down to 20-25% global hypokinesis.  Was in the setting of grieving over the loss of her son from MI.  ->  EF improved to 45-50% on follow-up echocardiogram.   Hypertension    Ischemic cardiomyopathy 06/2018   s/p inferior STEMI followed by 2 anterior STEMIs: Following recovery from Takotsubo Cardiomyopathy- Echo 02/14/19: Moderate LVH.  EF improved up to 45-50%.  GR 1 DD.  Severe apical akinesis.  Normal valves.   Mild dysplasia of cervix ([place]IN I) 02/04/2019   Seen after colposcopy on 01/23/19 done for ASCU +HPV pap  > repeat pap and HPV test in 12 and 24 months   STEMI involving left anterior descending coronary artery ([place]) 09/2017; 06/2018   a) Severe [place] 1, 1, 1 LAD-D2 lesion (complicated by post PTCA dissection/intramural hematoma) -successful extensive PCI of the LAD but unable to maintain patency of the stented D2. b) distal mLAD stent Edge 100% thrombosis --> PTCA & Overlapping DES PCI (Synergy 3 x 12 --> 3.6-3.3 mm). D2 occluded. RCA stents patent, 65 % OM2. EF 30-35%.   STEMI involving oth coronary artery of inferior wall ([place]) 03/2013   Secondary to subacute stent thrombosis of RCA stent segment having  missed 4 doses of Effient.   Tobacco abuse 01/11/2016   Type II diabetes mellitus ([place]) 12/2010   sister and son also diabetic     Past Surgical History:  Procedure Laterality Date   CHOLECYSTECTOMY  ~ 2000   CORONARY STENT INTERVENTION N/A 10/10/2017   Procedure: CORONARY STENT INTERVENTION;  Surgeon: Leonie Man, MD;  Location: [place] [facility];  Service: Cardiovascular; LAD PCI: STENT SYNERGY DES 3.5X32 (p),STENT SYNERGY DES 3.5X 8 (m), STENT SYNERGY DES 3.5X28 (d).  D2 PCI: STENT SYNERGY DES 2.5X24.  (UNABL TO REWIRE & EXPAND - TO OF DIAG AT END OF CASE)   CORONARY STENT INTERVENTION N/A 07/03/2018   Procedure: CORONARY STENT INTERVENTION;  Surgeon: Leonie Man, MD;  Location: [place] [facility]IVE [facility];; PTCA &  overlapping DES PCI (overlaps prior stents distally) - Synergy DES 3 x 12 (3.6 mm @ overlap -- 3.3 mm distally)>   LEFT HEART CATH AND CORONARY ANGIOGRAPHY N/A 10/10/2017   Procedure: LEFT HEART CATH AND CORONARY ANGIOGRAPHY;  Surgeon: Leonie Man, MD;  Location: [place] [facility];  Service: Cardiovascular;  Laterality: N/A; -patent RCA stents with mild in-stent restenosis. [place] 1,1,1 mLD-D2 70% (complicated by dissection/intramural thrombus)   LEFT HEART CATH AND CORONARY ANGIOGRAPHY N/A 07/03/2018   Procedure: LEFT HEART CATH AND CORONARY ANGIOGRAPHY;  Surgeon: Leonie Man, MD;  Location: [place] [facility];; 100% thrombotic occlusion distal LAD stent -- DES PCI. continued 100% D2 (previously lost). patent RCA stents, 65% OM2.  EF 30-35%.   LEFT HEART CATHETERIZATION WITH CORONARY ANGIOGRAM N/A 02/25/2013   Procedure: LEFT HEART CATHETERIZATION WITH CORONARY ANGIOGRAM;  Surgeon: Laverda Page, MD;  Location: [facility] CATH LAB;  Service: Cardiovascular;; CTO m RCA; otherwise normal coronaries   LEFT HEART CATHETERIZATION WITH CORONARY ANGIOGRAM N/A 04/01/2013   Procedure: LEFT HEART CATHETERIZATION WITH CORONARY ANGIOGRAM;  Surgeon: Laverda Page, MD;  Location: [facility] CATH  LAB;  Service: Cardiovascular: 100% occlusion of distal RCA stent (subacute stent thrombosis -  secondary to stopping Effient).  Overlapping DES PCI   NM MYOVIEW LTD  12/2015    EF 38%.  Hypertensive response to exercise (231/132 mmHg).  INTERMEDIATE RISK due to -diffuse hypokinesis and reduced EF.  No ischemia or infarction.   PERCUTANEOUS CORONARY STENT INTERVENTION (PCI-S)  04/01/2013   Procedure: PERCUTANEOUS CORONARY STENT INTERVENTION (PCI-S);  Surgeon: Laverda Page, MD;  Location: [facility] CATH LAB;  Service: Cardiovascular;; PCI of distal RCA stent subacute thrombosis: Promus Premier DES 3.0 mm x 20 mm.   PERCUTANEOUS CORONARY STENT INTERVENTION (PCI-S)  02/25/2013   Procedure: PERCUTANEOUS CORONARY STENT INTERVENTION (PCI-S);  Surgeon: Laverda Page, MD;  Location: [facility] CATH LAB;  RCA CTO PCI with overlapping Promus DES: 3.0 mm x 38 mm, 3.5 mm x 32m   RIGHT/LEFT HEART CATH AND CORONARY ANGIOGRAPHY N/A 11/04/2019   Procedure: RIGHT/LEFT HEART CATH AND CORONARY ANGIOGRAPHY;  Surgeon: HLeonie Man MD;  [facility];; Mildly elevated LVEDP.  Ostial D1 100%.  Proximal to mid LAD stent 10% ISR.  Mid and distal stent widely patent.  30% distal distance.  Ostial OM 2 55%.  Proximal to mid RCA stent 50% stenosis.  Mid to distal RCA 40%.  Suspect that reduced EF is related to hypertensive emergen   TRANSTHORACIC ECHOCARDIOGRAM  07/04/2018   recurrent Anterior STEMI: EF 35-40%.  Apical hypokinesis.  GRII DD.  No thrombus noted.  (Improved EF from 30% up to 35-40% from previous echo)   TRANSTHORACIC ECHOCARDIOGRAM  11/2018; 01/2019   a) TAKOTUSBO CM: EF 20-25%-diffuse HK..  Mod V thickness.  GRII DD w/ high LVEDP.  Severe LA dilation;; b) 02/14/19: Moderate LVH.  EF improved up to 45-50%.  GR 1 DD.  Severe apical akinesis.  Normal valves.   TRANSTHORACIC ECHOCARDIOGRAM  10/31/2019   Hypertensive emergency, hypoxic/hypercapnic respiratory failure;  EF 20 to 25%.  Global HK.  Mildly dilated  LV.  Mildly elevated PA pressures.  Mild LA dilation.  Mild AR.  Mildly elevated RAP-8 mmHg.=> Eventual cath showed no change   TUBAL LIGATION  1994    Current Medications: Current Meds  Medication Sig   blood glucose meter kit and supplies Dispense based on patient and insurance preference. Use up to four times daily as directed. (  FOR ICD-10 E10.9, E11.9).   Blood Glucose Monitoring Suppl (TRUE METRIX METER) DEVI 1 kit by Does not apply route 4 (four) times daily.   carvedilol (COREG) 25 MG tablet Take 1 tablet (25 mg total) by mouth 2 (two) times daily.   clopidogrel (PLAVIX) 75 MG tablet Take 1 tablet (75 mg total) by mouth daily.   DULoxetine (CYMBALTA) 60 MG capsule Take 1 capsule (60 mg total) by mouth daily. For diabetic neuropathy   Evolocumab (REPATHA SURECLICK) 540 MG/ML SOAJ Inject 140 mg into the skin every 14 (fourteen) days. Inject one pen into the subcutaneous tissue every 14 days   gabapentin (NEURONTIN) 300 MG capsule Take 1 capsule (300 mg total) by mouth 3 (three) times daily.   glipiZIDE (GLUCOTROL) 10 MG tablet TAKE 1 TABLET (10 MG TOTAL) BY MOUTH 2 (TWO) TIMES DAILY BEFORE A MEAL.   Insulin Glargine (BASAGLAR KWIKPEN) 100 UNIT/ML Inject 25 Units into the skin 2 (two) times daily.   Insulin Pen Needle (TECHLITE PEN NEEDLES) 32G X 4 MM MISC use 1 pen needle with basaglar once daily   Multiple Vitamins-Minerals (WOMENS 50+ MULTI VITAMIN PO) Take 1 tablet by mouth daily.   naproxen sodium (ALEVE) 220 MG tablet Take 220 mg by mouth daily as needed (pain).   nitroGLYCERIN (NITROSTAT) 0.4 MG SL tablet Place 1 tablet (0.4 mg total) under the tongue every 5 (five) minutes as needed for chest pain. Reported on 11/25/2015   rosuvastatin (CRESTOR) 40 MG tablet Take 1 tablet (40 mg total) by mouth daily.   sacubitril-valsartan (ENTRESTO) 49-51 MG Take 1 tablet by mouth 2 (two) times daily.   Semaglutide,0.25 or 0.5MG/DOS, (OZEMPIC, 0.25 OR 0.5 MG/DOSE,) 2 MG/1.5ML SOPN Inject 0.25 mg  into the skin once a week.   TRUEPLUS LANCETS 28G MISC 28 g by Does not apply route 4 (four) times daily.   [DISCONTINUED] furosemide (LASIX) 20 MG tablet Take 1 tablet (20 mg total) by mouth 2 (two) times daily.   [DISCONTINUED] sacubitril-valsartan (ENTRESTO) 97-103 MG Take 1 tablet by mouth 2 (two) times daily.   [DISCONTINUED] spironolactone (ALDACTONE) 25 MG tablet Take 1 tablet (25 mg total) by mouth daily.     Allergies:   [place] [dapagliflozin]   Social History   Socioeconomic History   Marital status: Single    Spouse name: Not on file   Number of children: 3   Years of education: Not on file   Highest education level: 11th grade  Occupational History    Employer: [employer]  Tobacco Use   Smoking status: Every Day    Packs/day: 0.25    Years: 22.00    Total pack years: 5.50    Types: Cigarettes   Smokeless tobacco: Never   Tobacco comments:    current smoker 4-5 cigarettes per day; previously smoked at least a pack a day.  Vaping Use   Vaping Use: Never used  Substance and Sexual Activity   Alcohol use: Yes    Alcohol/week: 0.0 standard drinks of alcohol    Comment: 07/04/2018 "only on special occasions; maybe once/yr"   Drug use: Yes    Types: Marijuana    Comment: 02/25/2013 "quit marijuana ~ 2001"   Sexual activity: Not Currently    Birth control/protection: Surgical  Other Topics Concern   Not on file  Social History Narrative   Diet: 50/50 take out and home cook meals (eats burgers and lots of fried foods), limits portions.       Exercise: walks 15-20  minutes twice per week   Social Determinants of Health   Financial Resource Strain: Medium Risk (02/13/2022)   Overall Financial Resource Strain (CARDIA)    Difficulty of Paying Living Expenses: Somewhat hard  Food Insecurity: Food Insecurity Present (02/13/2022)   Hunger Vital Sign    Worried About Running Out of Food in the Last Year: Sometimes true    Ran Out of Food in the Last Year: Sometimes  true  Transportation Needs: No Transportation Needs (02/13/2022)   PRAPARE - Hydrologist (Medical): No    Lack of Transportation (Non-Medical): No  Physical Activity: Not on file  Stress: Not on file  Social Connections: Not on file     Family History: The patient's family history includes Diabetes in her mother, sister, sister, and son.  ROS:   Please see the history of present illness.     All other systems reviewed and are negative.  EKGs/Labs/Other Studies Reviewed:    The following studies were reviewed today:  Echo 03/10/2021  1. Apical akinesis with overall low normal LV function.   2. Left ventricular ejection fraction, by estimation, is 50 to 55%. The  left ventricle has low normal function. The left ventricle demonstrates  regional wall motion abnormalities (see scoring diagram/findings for  description). There is mild left  ventricular hypertrophy. Left ventricular diastolic parameters are  consistent with Grade II diastolic dysfunction (pseudonormalization).  Elevated left atrial pressure.   3. Right ventricular systolic function is normal. The right ventricular  size is normal. There is normal pulmonary artery systolic pressure.   4. Left atrial size was moderately dilated.   5. The mitral valve is normal in structure. Mild mitral valve  regurgitation. No evidence of mitral stenosis.   6. The aortic valve is tricuspid. Aortic valve regurgitation is not  visualized. No aortic stenosis is present.   7. The inferior vena cava is normal in size with greater than 50%  respiratory variability, suggesting right atrial pressure of 3 mmHg.   EKG:  EKG is not ordered today.    Recent Labs: 11/21/2021: TSH 0.648 03/14/2022: ALT 11; BNP 10.8 03/17/2022: BUN 22; Creatinine, Ser 1.64; Hemoglobin 11.0; Platelets 352; Potassium 3.6; Sodium 142  Recent Lipid Panel    Component Value Date/Time   CHOL 113 10/18/2021 1057   TRIG 79 10/18/2021 1057    HDL 48 10/18/2021 1057   CHOLHDL 2.4 10/18/2021 1057   CHOLHDL 4.2 07/05/2018 0330   VLDL 15 07/05/2018 0330   LDLCALC 49 10/18/2021 1057     Risk Assessment/Calculations:           Physical Exam:    VS:  BP 110/76 (BP Location: Right Arm, Patient Position: Sitting, Cuff Size: Normal)   Pulse 68   Ht 5' 9"  (1.753 m)   Wt 227 lb (103 kg)   LMP 10/20/2015   SpO2 98%   BMI 33.52 kg/m        Wt Readings from Last 3 Encounters:  03/17/22 227 lb (103 kg)  03/14/22 224 lb 12.8 oz (102 kg)  01/25/22 227 lb 3.2 oz (103.1 kg)     GEN:  Well nourished, well developed in no acute distress HEENT: Normal NECK: No JVD; No carotid bruits LYMPHATICS: No lymphadenopathy CARDIAC: RRR, no murmurs, rubs, gallops RESPIRATORY:  Clear to auscultation without rales, wheezing or rhonchi  ABDOMEN: Soft, non-tender, non-distended MUSCULOSKELETAL:  No edema; No deformity  SKIN: Warm and dry NEUROLOGIC:  Alert and oriented x 3  PSYCHIATRIC:  Normal affect   ASSESSMENT:    1. Weakness   2. Hypertensive heart disease with chronic combined systolic and diastolic congestive heart failure ([place])   3. AKI (acute kidney injury) ([place])   4. Coronary artery disease involving native coronary artery of native heart without angina pectoris   5. Cardiomyopathy, ischemic   6. Hyperlipidemia LDL goal <70   7. Controlled type 2 diabetes mellitus without complication, without long-term current use of insulin (HCC)    PLAN:    In order of problems listed above:  Weakness: Blood pressure has improved since the last visit after holding the hydralazine.  I will reduce the spironolactone down to 12.5 mg daily and reduce Entresto to 49-51 mg twice a day.  I suspect the recent episodes of weakness and syncope was due to severe hypotension.  Hypertension: Has been having hypotensive and results recently, will continue to cut back on heart failure therapy  AKI: Obtain repeat basic metabolic panel  CAD: Denies  any recent chest pain  Ischemic cardiomyopathy: We will cut back on heart failure therapy by reducing the spironolactone and Entresto dosage.  Last echocardiogram obtained in August 2022 showed EF 50 to 55%  Hyperlipidemia: On Repatha  DM2: Managed by primary care provider           Medication Adjustments/Labs and Tests Ordered: Current medicines are reviewed at length with the patient today.  Concerns regarding medicines are outlined above.  Orders Placed This Encounter  Procedures   CBC   Basic metabolic panel   Meds ordered this encounter  Medications   furosemide (LASIX) 20 MG tablet    Sig: Take 1 tablet (20 mg total) by mouth as needed.    Dispense:  180 tablet    Refill:  1   spironolactone (ALDACTONE) 25 MG tablet    Sig: Take 0.5 tablets (12.5 mg total) by mouth daily.    Dispense:  90 tablet    Refill:  1   sacubitril-valsartan (ENTRESTO) 49-51 MG    Sig: Take 1 tablet by mouth 2 (two) times daily.    Dispense:  60 tablet    Refill:  3    Patient Instructions  Medication Instructions:  CHANGE how you take Lasix 20 mg - use only as needed  DECREASE Spironolactone to 12.5 mg daily   STOP Hydralazine   HOLD Entresto Saturday and Sunday- restart ENTRESTO 49-51 mg twice daily.   *If you need a refill on your cardiac medications before your next appointment, please call your pharmacy*   Lab Work: CBC, BMET today   If you have labs (blood work) drawn today and your tests are completely normal, you will receive your results only by: [place] (if you have MyChart) OR A paper copy in the mail If you have any lab test that is abnormal or we need to change your treatment, we will call you to review the results.   Follow-Up: At [facility], you and your health needs are our priority.  As part of our continuing mission to provide you with exceptional heart care, we have created designated Provider Care Teams.  These Care Teams include your primary  Cardiologist (physician) and Advanced Practice Providers (APPs -  Physician Assistants and Nurse Practitioners) who all work together to provide you with the care you need, when you need it.  We recommend signing up for the patient portal called "MyChart".  Sign up information is provided on this After Visit Summary.  MyChart is  used to connect with patients for Virtual Visits (Telemedicine).  Patients are able to view lab/test results, encounter notes, upcoming appointments, etc.  Non-urgent messages can be sent to your provider as well.   To learn more about what you can do with MyChart, go to [email or web address].    Your next appointment:   10 day(s)  The format for your next appointment:   In Person  Provider:   Almyra Deforest, PA-C            Signed, Almyra Deforest, [place]  03/19/2022 11:39 PM    [place]

## 2022-03-18 LAB — CBC
Hematocrit: 33.3 % — ABNORMAL LOW (ref 34.0–46.6)
Hemoglobin: 11 g/dL — ABNORMAL LOW (ref 11.1–15.9)
MCH: 26.8 pg (ref 26.6–33.0)
MCHC: 33 g/dL (ref 31.5–35.7)
MCV: 81 fL (ref 79–97)
Platelets: 352 10*3/uL (ref 150–450)
RBC: 4.1 x10E6/uL (ref 3.77–5.28)
RDW: 12.6 % (ref 11.7–15.4)
WBC: 12.2 10*3/uL — ABNORMAL HIGH (ref 3.4–10.8)

## 2022-03-18 LAB — BASIC METABOLIC PANEL
BUN/Creatinine Ratio: 13 (ref 9–23)
BUN: 22 mg/dL (ref 6–24)
CO2: 25 mmol/L (ref 20–29)
Calcium: 9.4 mg/dL (ref 8.7–10.2)
Chloride: 99 mmol/L (ref 96–106)
Creatinine, Ser: 1.64 mg/dL — ABNORMAL HIGH (ref 0.57–1.00)
Glucose: 72 mg/dL (ref 70–99)
Potassium: 3.6 mmol/L (ref 3.5–5.2)
Sodium: 142 mmol/L (ref 134–144)
eGFR: 37 mL/min/{1.73_m2} — ABNORMAL LOW (ref >59)

## 2022-03-19 ENCOUNTER — Encounter: Payer: Self-pay | Admitting: Physician Assistant

## 2022-03-21 ENCOUNTER — Other Ambulatory Visit: Payer: Self-pay

## 2022-03-24 ENCOUNTER — Telehealth: Payer: Self-pay | Admitting: Licensed Clinical Social Worker

## 2022-03-24 NOTE — Telephone Encounter (Signed)
H&V Care Navigation CSW Progress Note  Clinical Social Worker contacted patient by phone to f/u after previous visit. Pt had been given [employer] , we had discussed bringing in additional documents to submit. I was able to reach pt this morning at [phone], pt shares she is feeling better than she was during previous appts. She wakes up with a headache that resolves after standing and going to bathroom- encouraged her to let PCP or Heartcare team know if this persists. We discussed bringing papework for CAFA to upcoming appt. Pt would not commit at this time to bringing discussed paperwork. Let her know we remain available as interested.   Pt did have questions about her Repatha, needs new assistance application. Previously was uninsured and receiving it through PAP. I have contacted Gerald Stabs, PharmD to discuss what pt needs to do to apply/if it needs to be determined if insurance will cover the product.   Patient is participating in a Managed Medicaid Plan:  No, commercial insurance only Psychologist, counselling).  SDOH Screenings   Alcohol Screen: Not on file  Depression (PHQ2-9): High Risk (11/16/2021)   Depression (PHQ2-9)    PHQ-2 Score: 14  Financial Resource Strain: Medium Risk (02/13/2022)   Overall Financial Resource Strain (CARDIA)    Difficulty of Paying Living Expenses: Somewhat hard  Food Insecurity: Food Insecurity Present (02/13/2022)   Hunger Vital Sign    Worried About Running Out of Food in the Last Year: Sometimes true    Ran Out of Food in the Last Year: Sometimes true  Housing: Low Risk  (02/13/2022)   Housing    Last Housing Risk Score: 0  Physical Activity: Not on file  Social Connections: Not on file  Stress: Not on file  Tobacco Use: High Risk (03/19/2022)   Patient History    Smoking Tobacco Use: Every Day    Smokeless Tobacco Use: Never    Passive Exposure: Not on file  Transportation Needs: No Transportation Needs (02/13/2022)   PRAPARE - Transportation    Lack  of Transportation (Medical): No    Lack of Transportation (Non-Medical): No   Westley Hummer, MSW, [place]  [phone]- work cell phone (preferred) [phone]- desk phone

## 2022-03-24 NOTE — Telephone Encounter (Signed)
Pt has [employer]. Repatha should be covered now and patient should be eligible for copay card. Will complete PA

## 2022-03-26 ENCOUNTER — Emergency Department (HOSPITAL_COMMUNITY)
Admission: EM | Admit: 2022-03-26 | Discharge: 2022-03-26 | Discharge status: Against Medical Advice | Payer: Commercial Managed Care - HMO

## 2022-03-26 NOTE — ED Notes (Signed)
Pt stated that the wait was too long to be seen and she was not going to wait anymore. Pt stated that her son is in the ED and she will be back to visit.

## 2022-03-31 ENCOUNTER — Telehealth (HOSPITAL_COMMUNITY): Payer: Self-pay | Admitting: Licensed Clinical Social Worker

## 2022-03-31 NOTE — Telephone Encounter (Signed)
CSW received call from pt requesting help filling out another application for Repatha assistance.  After reviewing chart see that patient is supposed to be using Repatha copay card since she now has Pharmacist, community.  CSW assisted pt in signing up for copay card and sent card information to pt to take to her [place] Desk#: [phone] Cell#: [phone]

## 2022-04-04 ENCOUNTER — Other Ambulatory Visit: Payer: Self-pay

## 2022-04-04 ENCOUNTER — Ambulatory Visit: Payer: Commercial Managed Care - HMO | Attending: Physician Assistant | Admitting: Physician Assistant

## 2022-04-04 ENCOUNTER — Encounter: Payer: Self-pay | Admitting: Physician Assistant

## 2022-04-04 VITALS — BP 146/86 | HR 79 | Ht 69.0 in | Wt 226.2 lb

## 2022-04-04 DIAGNOSIS — I2510 Atherosclerotic heart disease of native coronary artery without angina pectoris: Secondary | ICD-10-CM

## 2022-04-04 DIAGNOSIS — E119 Type 2 diabetes mellitus without complications: Secondary | ICD-10-CM

## 2022-04-04 DIAGNOSIS — R42 Dizziness and giddiness: Secondary | ICD-10-CM | POA: Diagnosis not present

## 2022-04-04 DIAGNOSIS — I255 Ischemic cardiomyopathy: Secondary | ICD-10-CM

## 2022-04-04 DIAGNOSIS — I5042 Chronic combined systolic (congestive) and diastolic (congestive) heart failure: Secondary | ICD-10-CM | POA: Diagnosis not present

## 2022-04-04 DIAGNOSIS — I10 Essential (primary) hypertension: Secondary | ICD-10-CM

## 2022-04-04 DIAGNOSIS — E785 Hyperlipidemia, unspecified: Secondary | ICD-10-CM

## 2022-04-04 MED ORDER — ENTRESTO 97-103 MG PO TABS
1.0000 | ORAL_TABLET | Freq: Two times a day (BID) | ORAL | 3 refills | Status: DC
  Filled 2022-04-04: qty 180, 90d supply, fill #0
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
## 2022-04-04 NOTE — Patient Instructions (Addendum)
Medication Instructions:  TAKE Entresto 97-103 mg 2 times a day  Your physician recommends that you continue on your current medications as directed. Please refer to the Current Medication list given to you today.  *If you need a refill on your cardiac medications before your next appointment, please call your pharmacy*  Lab Work: NONE ordered at this time of appointment   If you have labs (blood work) drawn today and your tests are completely normal, you will receive your results only by: [place] (if you have MyChart) OR A paper copy in the mail If you have any lab test that is abnormal or we need to change your treatment, we will call you to review the results.  Testing/Procedures: NONE ordered at this time of appointment   Follow-Up: At [facility], you and your health needs are our priority.  As part of our continuing mission to provide you with exceptional heart care, we have created designated Provider Care Teams.  These Care Teams include your primary Cardiologist (physician) and Advanced Practice Providers (APPs -  Physician Assistants and Nurse Practitioners) who all work together to provide you with the care you need, when you need it.  Your next appointment:   2-3 month(s)  The format for your next appointment:   In Person  Provider:   Glenetta Hew, MD  or Almyra Deforest, PA-C        Other Instructions   Important Information About Sugar

## 2022-04-05 ENCOUNTER — Encounter: Payer: Self-pay | Admitting: Physician Assistant

## 2022-04-17 ENCOUNTER — Telehealth: Payer: Self-pay | Admitting: Licensed Clinical Social Worker
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
## 2022-04-20 ENCOUNTER — Other Ambulatory Visit: Payer: Self-pay

## 2022-04-20 MED ORDER — OZEMPIC (0.25 OR 0.5 MG/DOSE) 2 MG/3ML ~~LOC~~ SOPN
0.2500 mg | PEN_INJECTOR | SUBCUTANEOUS | 5 refills | Status: DC
  Filled 2022-04-20: qty 3, 56d supply, fill #0

## 2022-05-19 ENCOUNTER — Other Ambulatory Visit: Payer: Self-pay

## 2022-05-19 ENCOUNTER — Other Ambulatory Visit: Payer: Self-pay | Admitting: Family Medicine

## 2022-05-19 DIAGNOSIS — I25110 Atherosclerotic heart disease of native coronary artery with unstable angina pectoris: Secondary | ICD-10-CM

## 2022-05-19 MED ORDER — ROSUVASTATIN CALCIUM 40 MG PO TABS
40.0000 mg | ORAL_TABLET | Freq: Every day | ORAL | 1 refills | Status: DC
  Filled 2022-05-19: qty 30, 30d supply, fill #0
  Filled 2022-06-26 (×2): qty 30, 30d supply, fill #1
  Filled 2022-07-21: qty 30, 30d supply, fill #2
  Filled 2022-08-03 – 2022-10-06 (×2): qty 30, 30d supply, fill #3

## 2022-05-23 ENCOUNTER — Other Ambulatory Visit: Payer: Self-pay | Admitting: Family Medicine

## 2022-05-23 ENCOUNTER — Other Ambulatory Visit: Payer: Self-pay

## 2022-05-23 MED ORDER — CLOPIDOGREL BISULFATE 75 MG PO TABS
75.0000 mg | ORAL_TABLET | Freq: Every day | ORAL | 0 refills | Status: DC
  Filled 2022-05-23: qty 30, 30d supply, fill #0
  Filled 2022-06-26: qty 30, 30d supply, fill #1
  Filled 2022-07-21: qty 30, 30d supply, fill #2

## 2022-05-26 ENCOUNTER — Other Ambulatory Visit: Payer: Self-pay

## 2022-06-06 ENCOUNTER — Ambulatory Visit: Payer: Commercial Managed Care - HMO | Attending: Physician Assistant | Admitting: Physician Assistant

## 2022-06-06 ENCOUNTER — Other Ambulatory Visit: Payer: Self-pay

## 2022-06-06 ENCOUNTER — Encounter: Payer: Self-pay | Admitting: Physician Assistant

## 2022-06-06 VITALS — BP 142/92 | HR 63 | Ht 69.0 in | Wt 228.8 lb

## 2022-06-06 DIAGNOSIS — E785 Hyperlipidemia, unspecified: Secondary | ICD-10-CM

## 2022-06-06 DIAGNOSIS — I255 Ischemic cardiomyopathy: Secondary | ICD-10-CM

## 2022-06-06 DIAGNOSIS — I10 Essential (primary) hypertension: Secondary | ICD-10-CM | POA: Diagnosis not present

## 2022-06-06 DIAGNOSIS — I5042 Chronic combined systolic (congestive) and diastolic (congestive) heart failure: Secondary | ICD-10-CM

## 2022-06-06 DIAGNOSIS — E119 Type 2 diabetes mellitus without complications: Secondary | ICD-10-CM

## 2022-06-06 DIAGNOSIS — I2510 Atherosclerotic heart disease of native coronary artery without angina pectoris: Secondary | ICD-10-CM | POA: Diagnosis not present

## 2022-06-06 MED ORDER — HYDRALAZINE HCL 10 MG PO TABS
10.0000 mg | ORAL_TABLET | Freq: Two times a day (BID) | ORAL | 3 refills | Status: DC
  Filled 2022-06-06: qty 60, 30d supply, fill #0
  Filled 2022-07-10: qty 60, 30d supply, fill #1
  Filled 2022-08-18: qty 60, 30d supply, fill #2

## 2022-06-06 NOTE — Progress Notes (Unsigned)
Cardiology Office Note:    Date:  06/07/2022   ID:  Amy Chaney, DOB [birth year], MRN [id number]  PCP:  [name], [place] Providers Cardiologist:  Glenetta Hew, MD     Referring MD: [name], MD   Chief Complaint  Patient presents with   Follow-up    Seen for Dr. Ellyn Hack    History of Present Illness:    Amy Chaney is a 51 y.o. female with a hx of CAD, ICM, chronic combined systolic and diastolic heart failure, HTN, HLD, DM2 and chronic tobacco abuse.  Patient had abnormal Myoview in July 2014 and subsequently underwent DES PCI of proximal to mid RCA with 2 overlapping drug-eluting stents by Dr. Einar Gip.  She had another NSTEMI in September 2014 after stopping Effient for 4 days, this resulted in 100% subacute stent thrombosis of RCA stent, this was treated with distal overlapping drug-eluting stent.  She had anterior STEMI in March 2019 involve the bifurcation of D2 and LAD that was treated with drug-eluting stent.  She unfortunately had another an anterior STEMI in December 2019 with subacute stent thrombosis treated with DES to distal LAD.  EF was 35 to 40% the time.  Patient was admitted for flash pulmonary edema in May 2020, ejection fraction was 20 to 25% with signs of Takotsubo cardiomyopathy.  This occurred in the setting of her son's death.  She was started on carvedilol, Entresto and spironolactone.  Repeat echocardiogram in July 2020 shows EF has improved to 45 to 50%, moderate LVH, grade 1 DD, severe apical akinesis.  Patient was admitted again in April 2021 due to flash pulmonary edema and acute respiratory failure.  EF was down to 20 to 25% again on echocardiogram.  Left and right heart catheterization performed on 11/04/2019 showed 100% occluded ostial D2, patent stent in the LAD, 50% OM 2 lesion, 40% mid to distal RCA lesion.  Coronary anatomy was relatively stable, it was suspected that her reduced ejection fraction was due to hypertensive  urgency.  Patient was admitted in December 2021 due to bilateral pyelonephritis and E. coli bacteremia.  She was discharged on antibiotic. Repeat echocardiogram obtained on 03/10/2021 showed EF essentially normalized at 50 to 55%, akinesis of the apex, normal RV size, mild MR.   I saw the patient in June 2023 at which time she described a bubbly sensation in her chest which lasted a few seconds at a time and that typically occurred while she is at rest.  She was climbing stairs without exertion.  Given brief episode with this symptom, I recommended continue observation.  She also described an episode of passing out spell that occurred while at the clients house in the beginning of June, she says she got up to walk across the hallway where she had a significant dizziness and passed out for 5 minutes.  I reduced her spironolactone from 25 mg twice a day to 25 mg daily.  I obtained a heart monitor that showed minimal heart rate 46, maximal heart rate 119, average heart rate 67.  Predominantly showed sinus rhythm with 2 SVT, longest episode 17 beats with heart rate of 119 bpm, PVC burden less than 1%.  I saw the patient on 03/14/2022 for follow-up, she was quite weak.  She was dizzy with systolic blood pressure of 94/50.  I recommended she stop hydralazine completely.  I obtained blood work that showed potassium 3.1, creatinine 1.83, normal BMP, white blood cell count 13.3.  I ended up discontinuing her hydralazine and reduce the dose of spironolactone.  I changed her Lasix to as needed for leg swelling and worsening dyspnea.  I last saw the patient in September 2023, her blood pressure was higher.   Patient presents today for follow-up.  She will occasionally get some dizziness early in the morning about twice a week.  Overall, she has been feeling well without significant frequent dizziness like she had before.  Her blood pressure remain on the high side, even on repeat manual check and was 142/76.  I decided to  restart her on low-dose hydralazine 10 mg twice a day.  She used to be 100 mg 3 times a day which was too much for her and partially contributed to her syncope earlier this year.  Since changing the Lasix to as needed, she has not taken any dose of Lasix.  On physical exam, she has no lower extremity edema and her lung is clear, her weight is stable.  She appears to be euvolemic on physical exam.  I will continue to keep the Lasix as needed at this point.  She can follow-up with Dr. Ellyn Hack in 4 months.   Past Medical History:  Diagnosis Date   Coronary artery disease involving native coronary artery of native heart with angina pectoris ([place]) 01/2013   a. s/p PCI of the mid RCA 01/2013 //  b. LHC 9/14: EF 55%, RCA stent 100% occluded, LAD irregularities >>  subacute stent thrombosis, Promus DES PCI distal overlap // c) 09/2017 - Ant STEMI - LAD-D2 PCI - Successful PCI of LAD (3 overlapping DES), unable to restore flow down D2l that was stented as well.  Likely related to downstream dissection, but unable to rewire.   Daily headache    Depression    Dyslipidemia, goal LDL below 70    History of Doppler ultrasound    carotid bruit >> a. 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  EF down to 20-25% global hypokinesis.  Was in the setting of grieving over the loss of her son from MI.  ->  EF improved to 45-50% on follow-up echocardiogram.   Hypertension    Ischemic cardiomyopathy 06/2018   s/p inferior STEMI followed by 2 anterior STEMIs: Following recovery from Takotsubo Cardiomyopathy- Echo 02/14/19: Moderate LVH.  EF improved up to 45-50%.  GR 1 DD.  Severe apical akinesis.  Normal valves.   Mild dysplasia of cervix ([place] I) 02/04/2019   Seen after colposcopy on 01/23/19 done for ASCU +HPV pap  > repeat pap and HPV test in 12 and 24 months   STEMI involving left anterior descending coronary  artery ([place]) 09/2017; 06/2018   a) Severe [place] 1, 1, 1 LAD-D2 lesion (complicated by post PTCA dissection/intramural hematoma) -successful extensive PCI of the LAD but unable to maintain patency of the stented D2. b) distal mLAD stent Edge 100% thrombosis --> PTCA & Overlapping DES PCI (Synergy 3 x 12 --> 3.6-3.3 mm). D2 occluded. RCA stents patent, 65 % OM2. EF 30-35%.   STEMI involving oth coronary artery of inferior wall ([place]) 03/2013   Secondary to subacute stent thrombosis of RCA stent segment having missed 4 doses of Effient.   Tobacco abuse 01/11/2016   Type II diabetes mellitus ([place]) 12/2010   sister and son also diabetic     Past Surgical History:  Procedure Laterality Date  CHOLECYSTECTOMY  ~ 2000   CORONARY STENT INTERVENTION N/A 10/10/2017   Procedure: CORONARY STENT INTERVENTION;  Surgeon: Leonie Man, MD;  Location: [facility];  Service: Cardiovascular; LAD PCI: STENT SYNERGY DES 3.5X32 (p),STENT SYNERGY DES 3.5X 8 (m), STENT SYNERGY DES 3.5X28 (d).  D2 PCI: STENT SYNERGY DES 2.5X24.  (UNABL TO REWIRE & EXPAND - TO OF DIAG AT END OF CASE)   CORONARY STENT INTERVENTION N/A 07/03/2018   Procedure: CORONARY STENT INTERVENTION;  Surgeon: Leonie Man, MD;  Location: [facility];; PTCA & overlapping DES PCI (overlaps prior stents distally) - Synergy DES 3 x 12 (3.6 mm @ overlap -- 3.3 mm distally)>   LEFT HEART CATH AND CORONARY ANGIOGRAPHY N/A 10/10/2017   Procedure: LEFT HEART CATH AND CORONARY ANGIOGRAPHY;  Surgeon: Leonie Man, MD;  Location: [facility];  Service: Cardiovascular;  Laterality: N/A; -patent RCA stents with mild in-stent restenosis. [place] 1,1,1 mLD-D2 19% (complicated by dissection/intramural thrombus)   LEFT HEART CATH AND CORONARY ANGIOGRAPHY N/A 07/03/2018   Procedure: LEFT HEART CATH AND CORONARY ANGIOGRAPHY;  Surgeon: Leonie Man, MD;  Location: [facility];; 100% thrombotic occlusion distal LAD stent -- DES PCI.  continued 100% D2 (previously lost). patent RCA stents, 65% OM2.  EF 30-35%.   LEFT HEART CATHETERIZATION WITH CORONARY ANGIOGRAM N/A 02/25/2013   Procedure: LEFT HEART CATHETERIZATION WITH CORONARY ANGIOGRAM;  Surgeon: Laverda Page, MD;  Location: [facility] CATH LAB;  Service: Cardiovascular;; CTO m RCA; otherwise normal coronaries   LEFT HEART CATHETERIZATION WITH CORONARY ANGIOGRAM N/A 04/01/2013   Procedure: LEFT HEART CATHETERIZATION WITH CORONARY ANGIOGRAM;  Surgeon: Laverda Page, MD;  Location: [facility] CATH LAB;  Service: Cardiovascular: 100% occlusion of distal RCA stent (subacute stent thrombosis -  secondary to stopping Effient).  Overlapping DES PCI   NM MYOVIEW LTD  12/2015    EF 38%.  Hypertensive response to exercise (231/132 mmHg).  INTERMEDIATE RISK due to -diffuse hypokinesis and reduced EF.  No ischemia or infarction.   PERCUTANEOUS CORONARY STENT INTERVENTION (PCI-S)  04/01/2013   Procedure: PERCUTANEOUS CORONARY STENT INTERVENTION (PCI-S);  Surgeon: Laverda Page, MD;  Location: [facility] CATH LAB;  Service: Cardiovascular;; PCI of distal RCA stent subacute thrombosis: Promus Premier DES 3.0 mm x 20 mm.   PERCUTANEOUS CORONARY STENT INTERVENTION (PCI-S)  02/25/2013   Procedure: PERCUTANEOUS CORONARY STENT INTERVENTION (PCI-S);  Surgeon: Laverda Page, MD;  Location: [facility] C[facility] LAB;  RCA CTO PCI with overlapping Promus DES: 3.0 mm x 38 mm, 3.5 mm x 72m   RIGHT/LEFT HEART CATH AND CORONARY ANGIOGRAPHY N/A 11/04/2019   Procedure: RIGHT/LEFT HEART CATH AND CORONARY ANGIOGRAPHY;  Surgeon: HLeonie Man MD;  [facility];; Mildly elevated LVEDP.  Ostial D1 100%.  Proximal to mid LAD stent 10% ISR.  Mid and distal stent widely patent.  30% distal distance.  Ostial OM 2 55%.  Proximal to mid RCA stent 50% stenosis.  Mid to distal RCA 40%.  Suspect that reduced EF is related to hypertensive emergen   TRANSTHORACIC ECHOCARDIOGRAM  07/04/2018   recurrent Anterior STEMI: EF 35-40%.   Apical hypokinesis.  GRII DD.  No thrombus noted.  (Improved EF from 30% up to 35-40% from previous echo)   TRANSTHORACIC ECHOCARDIOGRAM  11/2018; 01/2019   a) TAKOTUSBO CM: EF 20-25%-diffuse HK..  Mod V thickness.  GRII DD w/ high LVEDP.  Severe LA dilation;; b) 02/14/19: Moderate LVH.  EF improved up to 45-50%.  GR 1 DD.  Severe apical akinesis.  Normal valves.   TRANSTHORACIC ECHOCARDIOGRAM  10/31/2019   Hypertensive emergency, hypoxic/hypercapnic respiratory failure;  EF 20 to 25%.  Global HK.  Mildly dilated LV.  Mildly elevated PA pressures.  Mild LA dilation.  Mild AR.  Mildly elevated RAP-8 mmHg.=> Eventual cath showed no change   TUBAL LIGATION  1994    Current Medications: Current Meds  Medication Sig   blood glucose meter kit and supplies Dispense based on patient and insurance preference. Use up to four times daily as directed. (FOR ICD-10 E10.9, E11.9).   Blood Glucose Monitoring Suppl (TRUE METRIX METER) DEVI 1 kit by Does not apply route 4 (four) times daily.   carvedilol (COREG) 25 MG tablet Take 1 tablet (25 mg total) by mouth 2 (two) times daily.   clopidogrel (PLAVIX) 75 MG tablet Take 1 tablet (75 mg total) by mouth daily.   DULoxetine (CYMBALTA) 60 MG capsule Take 1 capsule (60 mg total) by mouth daily. For diabetic neuropathy   furosemide (LASIX) 20 MG tablet Take 1 tablet (20 mg total) by mouth as needed.   gabapentin (NEURONTIN) 300 MG capsule Take 1 capsule (300 mg total) by mouth 3 (three) times daily.   glipiZIDE (GLUCOTROL) 10 MG tablet TAKE 1 TABLET (10 MG TOTAL) BY MOUTH 2 (TWO) TIMES DAILY BEFORE A MEAL.   hydrALAZINE (APRESOLINE) 10 MG tablet Take 1 tablet (10 mg total) by mouth in the morning and at bedtime.   Insulin Glargine (BASAGLAR KWIKPEN) 100 UNIT/ML Inject 25 Units into the skin 2 (two) times daily.   Insulin Pen Needle (TECHLITE PEN NEEDLES) 32G X 4 MM MISC use 1 pen needle with basaglar once daily   Multiple Vitamins-Minerals (WOMENS 50+ MULTI VITAMIN PO)  Take 1 tablet by mouth daily.   naproxen sodium (ALEVE) 220 MG tablet Take 220 mg by mouth daily as needed (pain).   nitroGLYCERIN (NITROSTAT) 0.4 MG SL tablet Place 1 tablet (0.4 mg total) under the tongue every 5 (five) minutes as needed for chest pain. Reported on 11/25/2015   rosuvastatin (CRESTOR) 40 MG tablet Take 1 tablet (40 mg total) by mouth daily.   sacubitril-valsartan (ENTRESTO) 97-103 MG Take 1 tablet by mouth 2 (two) times daily.   Semaglutide,0.25 or 0.5MG/DOS, (OZEMPIC, 0.25 OR 0.5 MG/DOSE,) 2 MG/3ML SOPN Inject 0.25 mg into the skin once a week.   spironolactone (ALDACTONE) 25 MG tablet Take 0.5 tablets (12.5 mg total) by mouth daily.   TRUEPLUS LANCETS 28G MISC 28 g by Does not apply route 4 (four) times daily.     Allergies:   [place] [dapagliflozin]   Social History   Socioeconomic History   Marital status: Single    Spouse name: Not on file   Number of children: 3   Years of education: Not on file   Highest education level: 11th grade  Occupational History    Employer: [employer]  Tobacco Use   Smoking status: Every Day    Packs/day: 0.25    Years: 22.00    Total pack years: 5.50    Types: Cigarettes   Smokeless tobacco: Never   Tobacco comments:    current smoker 4-5 cigarettes per day; previously smoked at least a pack a day.  Vaping Use   Vaping Use: Never used  Substance and Sexual Activity   Alcohol use: Yes    Alcohol/week: 0.0 standard drinks of alcohol    Comment: 07/04/2018 "only on special occasions; maybe once/yr"   Drug  use: Yes    Types: Marijuana    Comment: 02/25/2013 "quit marijuana ~ 2001"   Sexual activity: Not Currently    Birth control/protection: Surgical  Other Topics Concern   Not on file  Social History Narrative   Diet: 50/50 take out and home cook meals (eats burgers and lots of fried foods), limits portions.       Exercise: walks 15-20 minutes twice per week   Social Determinants of Health   Financial Resource Strain:  Medium Risk (02/13/2022)   Overall Financial Resource Strain (CARDIA)    Difficulty of Paying Living Expenses: Somewhat hard  Food Insecurity: Food Insecurity Present (02/13/2022)   Hunger Vital Sign    Worried About Running Out of Food in the Last Year: Sometimes true    Ran Out of Food in the Last Year: Sometimes true  Transportation Needs: No Transportation Needs (02/13/2022)   PRAPARE - Hydrologist (Medical): No    Lack of Transportation (Non-Medical): No  Physical Activity: Not on file  Stress: Not on file  Social Connections: Not on file     Family History: The patient's family history includes Diabetes in her mother, sister, sister, and son.  ROS:   Please see the history of present illness.     All other systems reviewed and are negative.  EKGs/Labs/Other Studies Reviewed:    The following studies were reviewed today:  Echo 03/10/2021  1. Apical akinesis with overall low normal LV function.   2. Left ventricular ejection fraction, by estimation, is 50 to 55%. The  left ventricle has low normal function. The left ventricle demonstrates  regional wall motion abnormalities (see scoring diagram/findings for  description). There is mild left  ventricular hypertrophy. Left ventricular diastolic parameters are  consistent with Grade II diastolic dysfunction (pseudonormalization).  Elevated left atrial pressure.   3. Right ventricular systolic function is normal. The right ventricular  size is normal. There is normal pulmonary artery systolic pressure.   4. Left atrial size was moderately dilated.   5. The mitral valve is normal in structure. Mild mitral valve  regurgitation. No evidence of mitral stenosis.   6. The aortic valve is tricuspid. Aortic valve regurgitation is not  visualized. No aortic stenosis is present.   7. The inferior vena cava is normal in size with greater than 50%  respiratory variability, suggesting right atrial pressure of 3  mmHg.   EKG:  EKG is not ordered today.    Recent Labs: 11/21/2021: TSH 0.648 03/14/2022: ALT 11; BNP 10.8 03/17/2022: BUN 22; Creatinine, Ser 1.64; Hemoglobin 11.0; Platelets 352; Potassium 3.6; Sodium 142  Recent Lipid Panel    Component Value Date/Time   CHOL 113 10/18/2021 1057   TRIG 79 10/18/2021 1057   HDL 48 10/18/2021 1057   CHOLHDL 2.4 10/18/2021 1057   CHOLHDL 4.2 07/05/2018 0330   VLDL 15 07/05/2018 0330   LDLCALC 49 10/18/2021 1057     Risk Assessment/Calculations:           Physical Exam:    VS:  BP (!) 142/92 (BP Location: Left Arm, Patient Position: Sitting, Cuff Size: Large)   Pulse 63   Ht _0  (1.753 m)   Wt 228 lb 12.8 oz (103.8 kg)   LMP 10/20/2015   SpO2 96%   BMI 33.79 kg/m        Wt Readings from Last 3 Encounters:  06/06/22 228 lb 12.8 oz (103.8 kg)  04/04/22 226 lb 3.2 oz (  102.6 kg)  03/17/22 227 lb (103 kg)     GEN: Well nourished, well developed in no acute distress HEENT: Normal NECK: No JVD; No carotid bruits LYMPHATICS: No lymphadenopathy CARDIAC: RRR, no murmurs, rubs, gallops RESPIRATORY:  Clear to auscultation without rales, wheezing or rhonchi  ABDOMEN: Soft, non-tender, non-distended MUSCULOSKELETAL:  No edema; No deformity  SKIN: Warm and dry NEUROLOGIC:  Alert and oriented x 3 PSYCHIATRIC:  Normal affect   ASSESSMENT:    1. Coronary artery disease involving native coronary artery of native heart without angina pectoris   2. Ischemic cardiomyopathy   3. Chronic combined systolic (congestive) and diastolic (congestive) heart failure ([place])   4. Primary hypertension   5. Hyperlipidemia LDL goal <70   6. Controlled type 2 diabetes mellitus without complication, without long-term current use of insulin (HCC)    PLAN:    In order of problems listed above:  CAD: Denies any recent chest pain.  Continue Plavix  Ischemic cardiomyopathy: On carvedilol, Entresto, and spironolactone  Chronic systolic and diastolic heart  failure: Appears to be euvolemic on exam.  Ejection fraction improved to 50 to 55% on last echocardiogram in August 2022.  Hypertension: Blood pressure elevated, I previously discontinued her hydralazine due to hypotension and syncope, her blood pressure has bounced to the higher side.  I will restart hydralazine at 10 mg twice a day  Hyperlipidemia: On Repatha and Crestor  DM2: Managed by primary care provider.           Medication Adjustments/Labs and Tests Ordered: Current medicines are reviewed at length with the patient today.  Concerns regarding medicines are outlined above.  No orders of the defined types were placed in this encounter.  Meds ordered this encounter  Medications   hydrALAZINE (APRESOLINE) 10 MG tablet    Sig: Take 1 tablet (10 mg total) by mouth in the morning and at bedtime.    Dispense:  180 tablet    Refill:  3    Patient Instructions  Medication Instructions:  RESTART Hydralazine 10 mg 2 times a day *If you need a refill on your cardiac medications before your next appointment, please call your pharmacy*  Lab Work: NONE ordered at this time of appointment   If you have labs (blood work) drawn today and your tests are completely normal, you will receive your results only by: [place] (if you have MyChart) OR A paper copy in the mail If you have any lab test that is abnormal or we need to change your treatment, we will call you to review the results.  Testing/Procedures: NONE ordered at this time of appointment   Follow-Up: At [facility], you and your health needs are our priority.  As part of our continuing mission to provide you with exceptional heart care, we have created designated Provider Care Teams.  These Care Teams include your primary Cardiologist (physician) and Advanced Practice Providers (APPs -  Physician Assistants and Nurse Practitioners) who all work together to provide you with the care you need, when you need  it.  Your next appointment:   4 month(s)  The format for your next appointment:   In Person  Provider:   Glenetta Hew, MD     Other Instructions  Important Information About Sugar         Signed, Almyra Deforest, [place]  06/07/2022 11:10 PM    [place]

## 2022-06-06 NOTE — Patient Instructions (Signed)
Medication Instructions:  RESTART Hydralazine 10 mg 2 times a day *If you need a refill on your cardiac medications before your next appointment, please call your pharmacy*  Lab Work: NONE ordered at this time of appointment   If you have labs (blood work) drawn today and your tests are completely normal, you will receive your results only by: [place] (if you have MyChart) OR A paper copy in the mail If you have any lab test that is abnormal or we need to change your treatment, we will call you to review the results.  Testing/Procedures: NONE ordered at this time of appointment   Follow-Up: At [facility], you and your health needs are our priority.  As part of our continuing mission to provide you with exceptional heart care, we have created designated Provider Care Teams.  These Care Teams include your primary Cardiologist (physician) and Advanced Practice Providers (APPs -  Physician Assistants and Nurse Practitioners) who all work together to provide you with the care you need, when you need it.  Your next appointment:   4 month(s)  The format for your next appointment:   In Person  Provider:   Glenetta Hew, MD     Other Instructions  Important Information About Sugar

## 2022-06-07 ENCOUNTER — Encounter: Payer: Self-pay | Admitting: Physician Assistant

## 2022-06-19 ENCOUNTER — Telehealth: Payer: Self-pay | Admitting: Pharmacist Clinician (PhC)/ Clinical Pharmacy Specialist

## 2022-06-19 NOTE — Telephone Encounter (Signed)
[employer] Safety [employer] application faxed

## 2022-06-26 ENCOUNTER — Other Ambulatory Visit: Payer: Self-pay

## 2022-06-26 ENCOUNTER — Other Ambulatory Visit: Payer: Self-pay | Admitting: Family Medicine

## 2022-06-26 DIAGNOSIS — E1165 Type 2 diabetes mellitus with hyperglycemia: Secondary | ICD-10-CM

## 2022-06-26 MED ORDER — GLIPIZIDE 10 MG PO TABS
10.0000 mg | ORAL_TABLET | Freq: Two times a day (BID) | ORAL | 0 refills | Status: DC
  Filled 2022-06-26 (×2): qty 60, 30d supply, fill #0

## 2022-07-06 ENCOUNTER — Emergency Department (HOSPITAL_COMMUNITY): Payer: Commercial Managed Care - HMO

## 2022-07-06 ENCOUNTER — Other Ambulatory Visit: Payer: Self-pay

## 2022-07-06 ENCOUNTER — Telehealth: Payer: Self-pay | Admitting: Pharmacist Clinician (PhC)/ Clinical Pharmacy Specialist

## 2022-07-06 ENCOUNTER — Emergency Department (HOSPITAL_COMMUNITY)
Admission: EM | Admit: 2022-07-06 | Discharge: 2022-07-07 | Disposition: A | Discharge status: Home / Self Care | Payer: Commercial Managed Care - HMO | Attending: Emergency Medicine | Admitting: Emergency Medicine

## 2022-07-06 DIAGNOSIS — Z1152 Encounter for screening for COVID-19: Secondary | ICD-10-CM | POA: Diagnosis not present

## 2022-07-06 DIAGNOSIS — J101 Influenza due to other identified influenza virus with other respiratory manifestations: Secondary | ICD-10-CM | POA: Insufficient documentation

## 2022-07-06 DIAGNOSIS — I2510 Atherosclerotic heart disease of native coronary artery without angina pectoris: Secondary | ICD-10-CM | POA: Insufficient documentation

## 2022-07-06 DIAGNOSIS — Z794 Long term (current) use of insulin: Secondary | ICD-10-CM | POA: Diagnosis not present

## 2022-07-06 DIAGNOSIS — R059 Cough, unspecified: Secondary | ICD-10-CM | POA: Diagnosis present

## 2022-07-06 DIAGNOSIS — E119 Type 2 diabetes mellitus without complications: Secondary | ICD-10-CM | POA: Insufficient documentation

## 2022-07-06 LAB — CBC WITH DIFFERENTIAL/PLATELET
Abs Immature Granulocytes: 0.01 10*3/uL (ref 0.00–0.07)
Basophils Absolute: 0.1 10*3/uL (ref 0.0–0.1)
Basophils Relative: 1 %
Eosinophils Absolute: 0.2 10*3/uL (ref 0.0–0.5)
Eosinophils Relative: 5 %
HCT: 35.4 % — ABNORMAL LOW (ref 36.0–46.0)
Hemoglobin: 11.8 g/dL — ABNORMAL LOW (ref 12.0–15.0)
Immature Granulocytes: 0 %
Lymphocytes Relative: 27 %
Lymphs Abs: 0.9 10*3/uL (ref 0.7–4.0)
MCH: 27 pg (ref 26.0–34.0)
MCHC: 33.3 g/dL (ref 30.0–36.0)
MCV: 81 fL (ref 80.0–100.0)
Monocytes Absolute: 0.6 10*3/uL (ref 0.1–1.0)
Monocytes Relative: 18 %
Neutro Abs: 1.7 10*3/uL (ref 1.7–7.7)
Neutrophils Relative %: 49 %
Platelets: 252 10*3/uL (ref 150–400)
RBC: 4.37 MIL/uL (ref 3.87–5.11)
RDW: 15.2 % (ref 11.5–15.5)
WBC: 3.5 10*3/uL — ABNORMAL LOW (ref 4.0–10.5)
nRBC: 0 % (ref 0.0–0.2)

## 2022-07-06 LAB — COMPREHENSIVE METABOLIC PANEL
ALT: 15 U/L (ref 0–44)
AST: 20 U/L (ref 15–41)
Albumin: 3.4 g/dL — ABNORMAL LOW (ref 3.5–5.0)
Alkaline Phosphatase: 39 U/L (ref 38–126)
Anion gap: 11 (ref 5–15)
BUN: 9 mg/dL (ref 6–20)
CO2: 23 mmol/L (ref 22–32)
Calcium: 9 mg/dL (ref 8.9–10.3)
Chloride: 100 mmol/L (ref 98–111)
Creatinine, Ser: 1.21 mg/dL — ABNORMAL HIGH (ref 0.44–1.00)
GFR, Estimated: 54 mL/min — ABNORMAL LOW (ref >60)
Glucose, Bld: 254 mg/dL — ABNORMAL HIGH (ref 70–99)
Potassium: 3.7 mmol/L (ref 3.5–5.1)
Sodium: 134 mmol/L — ABNORMAL LOW (ref 135–145)
Total Bilirubin: 0.7 mg/dL (ref 0.3–1.2)
Total Protein: 7.5 g/dL (ref 6.5–8.1)

## 2022-07-06 LAB — RESP PANEL BY RT-PCR (FLU A&B, COVID) ARPGX2
Influenza A by PCR: POSITIVE — AB
Influenza B by PCR: NEGATIVE
SARS Coronavirus 2 by RT PCR: NEGATIVE

## 2022-07-06 LAB — LACTIC ACID, PLASMA: Lactic Acid, Venous: 0.9 mmol/L (ref 0.5–1.9)

## 2022-07-06 LAB — TROPONIN I (HIGH SENSITIVITY)
Troponin I (High Sensitivity): 12 ng/L (ref <18)
Troponin I (High Sensitivity): 11 ng/L (ref <18)

## 2022-07-06 MED ORDER — ACETAMINOPHEN 325 MG PO TABS
650.0000 mg | ORAL_TABLET | Freq: Once | ORAL | Status: AC | PRN
  Administered 2022-07-06: 650 mg via ORAL
  Filled 2022-07-06: qty 2

## 2022-07-06 NOTE — ED Notes (Signed)
Pt reassessed, NAD noted at this time. Tylenol ordered for temp and body aches

## 2022-07-06 NOTE — ED Triage Notes (Signed)
Pt presents with mid epigastric pain and cough/congestion since Tuesday.  Pt states she has felt hot and cold.

## 2022-07-06 NOTE — Telephone Encounter (Signed)
LMOM for patient.  She needs to reach out to [employer] safety [employer] to clarify income information.

## 2022-07-06 NOTE — ED Provider Triage Note (Signed)
Emergency Medicine Provider Triage Evaluation Note  Amy Chaney , a 51 y.o. female  was evaluated in triage.  Pt complains of 2 days of cough and chest pain.  She says pain is underneath both of her breast and worse when breathing and coughing.  She has had associated cough that has been nonproductive.  She has associated sore throat and fever..  Review of Systems  Positive:  Negative:   Physical Exam  BP (!) 166/87 (BP Location: Right Arm)   Pulse 89   Temp (!) 100.8 F (38.2 C)   Resp 18   LMP 10/20/2015   SpO2 97%  Gen:   Awake, no distress   Resp:  Normal effort  MSK:   Moves extremities without difficulty  Other:  Lungs clear, Heart RRR. Pulses equal. Abd soft, nontender. Warm to touch  Medical Decision Making  Medically screening exam initiated at 3:56 PM.  Appropriate orders placed.  Amy Chaney was informed that the remainder of the evaluation will be completed by another provider, this initial triage assessment does not replace that evaluation, and the importance of remaining in the ED until their evaluation is complete.     Adolphus Birchwood, [place] 07/06/22 1557

## 2022-07-07 DIAGNOSIS — J101 Influenza due to other identified influenza virus with other respiratory manifestations: Secondary | ICD-10-CM | POA: Diagnosis not present

## 2022-07-07 LAB — LACTIC ACID, PLASMA: Lactic Acid, Venous: 1.2 mmol/L (ref 0.5–1.9)

## 2022-07-07 MED ORDER — LACTATED RINGERS IV BOLUS
500.0000 mL | Freq: Once | INTRAVENOUS | Status: AC
  Administered 2022-07-07: 500 mL via INTRAVENOUS

## 2022-07-07 NOTE — ED Provider Notes (Signed)
[facility] EMERGENCY DEPARTMENT Provider Note   CSN: [id number] Arrival date & time: 07/06/22  1517     History  Chief Complaint  Patient presents with   Cough   Chest Pain    Amy Chaney is a 51 y.o. female who presents with concern for cough, sore throat, congestion, and fatigue x 3 days after exposure to ill client; patient works as Lexicographer.   I have personally reviewed this patient's medical records. Hx of hyperlipidemia, Dmt2, CAD with hx of STEMI, prolonged-QT interval, dyslipidemia.   HPI     Home Medications Prior to Admission medications   Medication Sig Start Date End Date Taking? Authorizing Provider  blood glucose meter kit and supplies Dispense based on patient and insurance preference. Use up to four times daily as directed. (FOR ICD-10 E10.9, E11.9). 07/06/18   Bhagat, Crista Luria, PA  Blood Glucose Monitoring Suppl (TRUE METRIX METER) DEVI 1 kit by Does not apply route 4 (four) times daily. 10/24/17   Brayton Caves, PA-C  carvedilol (COREG) 25 MG tablet Take 1 tablet (25 mg total) by mouth 2 (two) times daily. 11/16/21 06/21/22  [name], MD  clopidogrel (PLAVIX) 75 MG tablet Take 1 tablet (75 mg total) by mouth daily. 05/23/22   [name], MD  DULoxetine (CYMBALTA) 60 MG capsule Take 1 capsule (60 mg total) by mouth daily. For diabetic neuropathy 11/16/21   [name], MD  Evolocumab (REPATHA SURECLICK) 672 MG/ML SOAJ Inject 140 mg into the skin every 14 (fourteen) days. Inject one pen into the subcutaneous tissue every 14 days Patient not taking: Reported on 06/06/2022    [provider]  furosemide (LASIX) 20 MG tablet Take 1 tablet (20 mg total) by mouth as needed. 03/17/22 03/17/23  Almyra Deforest, PA  gabapentin (NEURONTIN) 300 MG capsule Take 1 capsule (300 mg total) by mouth 3 (three) times daily. 11/16/21 06/14/22  [name], MD  glipiZIDE (GLUCOTROL) 10 MG tablet Take 1 tablet (10 mg total) by  mouth 2 (two) times daily. 06/26/22   [name], MD  hydrALAZINE (APRESOLINE) 10 MG tablet Take 1 tablet (10 mg total) by mouth in the morning and at bedtime. 06/06/22   Almyra Deforest, PA  Insulin Glargine (BASAGLAR KWIKPEN) 100 UNIT/ML Inject 25 Units into the skin 2 (two) times daily. 11/16/21   [name], MD  Insulin Pen Needle (TECHLITE PEN NEEDLES) 32G X 4 MM MISC use 1 pen needle with basaglar once daily 05/06/21   [name], MD  Multiple Vitamins-Minerals (WOMENS 50+ MULTI VITAMIN PO) Take 1 tablet by mouth daily.    [provider]  naproxen sodium (ALEVE) 220 MG tablet Take 220 mg by mouth daily as needed (pain).    [provider]  nitroGLYCERIN (NITROSTAT) 0.4 MG SL tablet Place 1 tablet (0.4 mg total) under the tongue every 5 (five) minutes as needed for chest pain. Reported on 11/25/2015 07/06/18   Leanor Kail, PA  rosuvastatin (CRESTOR) 40 MG tablet Take 1 tablet (40 mg total) by mouth daily. 05/19/22 05/14/23  [name], MD  sacubitril-valsartan (ENTRESTO) 97-103 MG Take 1 tablet by mouth 2 (two) times daily. 04/04/22   Almyra Deforest, PA  Semaglutide,0.25 or 0.5MG/DOS, (OZEMPIC, 0.25 OR 0.5 MG/DOSE,) 2 MG/3ML SOPN Inject 0.25 mg into the skin once a week. 11/16/21   [name], MD  spironolactone (ALDACTONE) 25 MG tablet Take 0.5 tablets (12.5 mg total) by mouth daily. 03/17/22 03/17/23  Almyra Deforest, [place]  TRUEPLUS  LANCETS 28G MISC 28 g by Does not apply route 4 (four) times daily. 07/06/18   Rai, Ripudeep Raliegh Ip, MD  buPROPion (WELLBUTRIN SR) 150 MG 12 hr tablet Take 1 tablet (150 mg total) by mouth 2 (two) times daily. 11/17/19 07/20/20  [name], MD      Allergies    Wilder Glade [dapagliflozin]    Review of Systems   Review of Systems  Constitutional:  Positive for activity change, appetite change, chills, fatigue and fever.  HENT:  Positive for congestion, sinus pressure, sneezing and sore throat. Negative for dental problem, ear discharge, ear  pain and trouble swallowing.   Eyes: Negative.   Respiratory:  Positive for cough. Negative for chest tightness and shortness of breath.   Cardiovascular: Negative.   Gastrointestinal: Negative.   Genitourinary: Negative.   Musculoskeletal:  Positive for myalgias.  Neurological:  Positive for headaches. Negative for dizziness and light-headedness.    Physical Exam Updated Vital Signs BP (!) 151/67 (BP Location: Right Arm)   Pulse 66   Temp 99.2 F (37.3 C) (Oral)   Resp 18   LMP 10/20/2015   SpO2 100%  Physical Exam Vitals and nursing note reviewed.  Constitutional:      Appearance: She is obese. She is not ill-appearing or toxic-appearing.  HENT:     Head: Normocephalic and atraumatic.     Mouth/Throat:     Mouth: Mucous membranes are moist.     Dentition: Dental tenderness present.     Pharynx: Oropharynx is clear. Uvula midline. Posterior oropharyngeal erythema present. No oropharyngeal exudate or uvula swelling.     Tonsils: No tonsillar exudate.     Comments: No sublingual or submental TTP.  Eyes:     General:        Right eye: No discharge.        Left eye: No discharge.     Extraocular Movements: Extraocular movements intact.     Conjunctiva/sclera: Conjunctivae normal.     Pupils: Pupils are equal, round, and reactive to light.  Cardiovascular:     Rate and Rhythm: Normal rate and regular rhythm.     Pulses: Normal pulses.     Heart sounds: Normal heart sounds. No murmur heard. Pulmonary:     Effort: Pulmonary effort is normal. No respiratory distress.     Breath sounds: Normal breath sounds. No wheezing or rales.  Chest:     Chest wall: No mass, tenderness or edema.  Abdominal:     General: Bowel sounds are normal. There is no distension.     Tenderness: There is no abdominal tenderness.  Musculoskeletal:        General: No deformity.     Cervical back: Neck supple.     Right lower leg: No tenderness. No edema.     Left lower leg: No tenderness. No  edema.  Skin:    General: Skin is warm and dry.     Capillary Refill: Capillary refill takes less than 2 seconds.     Findings: No rash.  Neurological:     General: No focal deficit present.     Mental Status: She is alert and oriented to person, place, and time. Mental status is at baseline.  Psychiatric:        Mood and Affect: Mood normal.     ED Results / Procedures / Treatments   Labs (all labs ordered are listed, but only abnormal results are displayed) Labs Reviewed  RESP PANEL BY RT-PCR (FLU A&B, COVID) ARPGX2 -  Abnormal; Notable for the following components:      Result Value   Influenza A by PCR POSITIVE (*)    All other components within normal limits  COMPREHENSIVE METABOLIC PANEL - Abnormal; Notable for the following components:   Sodium 134 (*)    Glucose, Bld 254 (*)    Creatinine, Ser 1.21 (*)    Albumin 3.4 (*)    GFR, Estimated 54 (*)    All other components within normal limits  CBC WITH DIFFERENTIAL/PLATELET - Abnormal; Notable for the following components:   WBC 3.5 (*)    Hemoglobin 11.8 (*)    HCT 35.4 (*)    All other components within normal limits  LACTIC ACID, PLASMA  LACTIC ACID, PLASMA  TROPONIN I (HIGH SENSITIVITY)  TROPONIN I (HIGH SENSITIVITY)    EKG None  Radiology DG Chest 2 View  Result Date: 07/06/2022 CLINICAL DATA:  Chest pain. EXAM: CHEST - 2 VIEW COMPARISON:  July 21, 2020. FINDINGS: The heart size and mediastinal contours are within normal limits. Both lungs are clear. The visualized skeletal structures are unremarkable. IMPRESSION: No active cardiopulmonary disease. Electronically Signed   By: Marijo Conception M.D.   On: 07/06/2022 16:53    Procedures Procedures    Medications Ordered in ED Medications  lactated ringers bolus 500 mL (has no administration in time range)  acetaminophen (TYLENOL) tablet 650 mg (650 mg Oral Given 07/06/22 1818)    ED Course/ Medical Decision Making/ A&P                            Medical Decision Making 51 year old female presents with 3 days of nasal congestion, sore throat, body ache, fever chills and fatigue.  Febrile to 100.8 F on intake, administered Tylenol in triage.  Mildly hypertensive now resolved.  Vital signs now normal.  Cardiopulmonary exam is normal, dental exam is benign.  Patient appears well-hydrated with moist mucous membranes, posterior pharynx is erythematous without exudate, uvular deviation or evidence of oropharyngeal abscess.  No sublingual or submental tenderness palpation.  Differential diagnosis includes limited to URI such as COVID-19 or influenza, sepsis, pneumonia  Amount and/or Complexity of Data Reviewed Labs:     Details: CBC without leukocytosis, anemia with hbg of 11.8, CMP with hyponatremia to 134,  CR of 1.2 at baseline, troponin negative x 2 flat, lactic acid normal, Influenza A +. DG  Radiology:     Details: chest negative for acute cardiopulmonary disease.  ECG/medicine tests:     Details: EKG with NRS no STEMI.   Risk OTC drugs.   Overall vital signs and workup reassuring.  No further workup warranted in ED at this time.  Clinical concern for emergent underlying etiology for this patient symptoms that warrant further ED workup or inpatient management is exceedingly low.  Amy Chaney  voiced understanding of her medical evaluation and treatment plan. Each of their questions answered to their expressed satisfaction.  Return precautions were given.  Patient is well-appearing, stable, and was discharged in good condition.  This chart was dictated using voice recognition software, Dragon. Despite the best efforts of this provider to proofread and correct errors, errors may still occur which can change documentation meaning.    Final Clinical Impression(s) / ED Diagnoses Final diagnoses:  Influenza A    Rx / DC Orders ED Discharge Orders     None         Emeline Darling, PA-C 07/07/22 0025  Fatima Blank, MD 07/07/22 [phone]

## 2022-07-07 NOTE — Discharge Instructions (Signed)
You are seen here today for your body aches, fever, and sore throat.  You tested positive for influenza A.  Please take Tylenol as needed drainage or fever, you may use over-the-counter decongestants as needed as well.  Please increase your hydration and follow-up closely with your primary care doctor.  Return to ER with any new severe symptoms.

## 2022-07-07 NOTE — ED Notes (Signed)
Patient discharged to home. PT alert and oriented x 4 and walked with stable gait. Pt denied any other needs at that time.

## 2022-07-10 ENCOUNTER — Other Ambulatory Visit: Payer: Self-pay

## 2022-07-18 ENCOUNTER — Other Ambulatory Visit: Payer: Self-pay

## 2022-07-21 ENCOUNTER — Other Ambulatory Visit: Payer: Self-pay | Admitting: Family Medicine

## 2022-07-21 ENCOUNTER — Other Ambulatory Visit: Payer: Self-pay

## 2022-07-21 DIAGNOSIS — E1165 Type 2 diabetes mellitus with hyperglycemia: Secondary | ICD-10-CM

## 2022-07-26 ENCOUNTER — Other Ambulatory Visit: Payer: Self-pay

## 2022-07-28 ENCOUNTER — Other Ambulatory Visit: Payer: Self-pay

## 2022-08-03 ENCOUNTER — Other Ambulatory Visit: Payer: Self-pay

## 2022-08-18 ENCOUNTER — Other Ambulatory Visit: Payer: Self-pay

## 2022-08-22 ENCOUNTER — Other Ambulatory Visit: Payer: Self-pay

## 2022-09-01 ENCOUNTER — Other Ambulatory Visit: Payer: Self-pay

## 2022-09-01 ENCOUNTER — Other Ambulatory Visit: Payer: Self-pay | Admitting: Family Medicine

## 2022-09-01 NOTE — Telephone Encounter (Signed)
Requested medication (s) are due for refill today: yes  Requested medication (s) are on the active medication list: yes  Last refill:  05/23/22  Future visit scheduled: no  Notes to clinic:  Unable to refill per protocol, courtesy refill already given, routing for provider approval.      Requested Prescriptions  Pending Prescriptions Disp Refills   clopidogrel (PLAVIX) 75 MG tablet 90 tablet 0    Sig: Take 1 tablet (75 mg total) by mouth daily.     Hematology: Antiplatelets - clopidogrel Failed - 09/01/2022  8:27 AM      Failed - HCT in normal range and within 180 days    HCT  Date Value Ref Range Status  07/06/2022 35.4 (L) 36.0 - 46.0 % Final   Hematocrit  Date Value Ref Range Status  03/17/2022 33.3 (L) 34.0 - 46.6 % Final         Failed - HGB in normal range and within 180 days    Hemoglobin  Date Value Ref Range Status  07/06/2022 11.8 (L) 12.0 - 15.0 g/dL Final  03/17/2022 11.0 (L) 11.1 - 15.9 g/dL Final         Failed - Cr in normal range and within 360 days    Creat  Date Value Ref Range Status  02/17/2015 0.59 0.50 - 1.10 mg/dL Final   Creatinine, Ser  Date Value Ref Range Status  07/06/2022 1.21 (H) 0.44 - 1.00 mg/dL Final   Creatinine, Urine  Date Value Ref Range Status  02/17/2015 111.9 mg/dL Final    Comment:    No reference range established.         Failed - Valid encounter within last 6 months    Recent Outpatient Visits           9 months ago Primary hypertension   [facility], [place] L, RPH-CPP   9 months ago Uncontrolled type 2 diabetes mellitus with hyperglycemia [facility])   [facility] [place], Charlane Ferretti, MD   11 months ago Other diabetic neurological complication associated with type 2 diabetes mellitus [facility])   [facility] [place], [place] W, NP   1 year ago Uncontrolled type 2 diabetes mellitus with hyperglycemia [facility])   [facility] Gildardo Pounds, NP   1 year ago Intermittent lightheadedness   [facility], Kriste Basque, NP              Passed - PLT in normal range and within 180 days    Platelets  Date Value Ref Range Status  07/06/2022 252 150 - 400 K/uL Final  03/17/2022 352 150 - 450 x10E3/uL Final

## 2022-09-04 ENCOUNTER — Other Ambulatory Visit: Payer: Self-pay

## 2022-09-05 ENCOUNTER — Other Ambulatory Visit: Payer: Self-pay

## 2022-09-06 ENCOUNTER — Other Ambulatory Visit: Payer: Self-pay

## 2022-09-06 MED ORDER — ENTRESTO 97-103 MG PO TABS: 1.0000 | ORAL_TABLET | Freq: Two times a day (BID) | ORAL | 2 refills | Status: DC

## 2022-09-11 ENCOUNTER — Other Ambulatory Visit: Payer: Self-pay | Admitting: Nurse Practitioner

## 2022-09-11 DIAGNOSIS — Z1231 Encounter for screening mammogram for malignant neoplasm of breast: Secondary | ICD-10-CM

## 2022-09-13 ENCOUNTER — Telehealth: Payer: Self-pay | Admitting: Pharmacist

## 2022-09-13 NOTE — Telephone Encounter (Signed)
[place] friend. This patient has an appt coming up with Levada Dy on 10/04/2022. Can we set her up on a DM office visit for me after that? She is one of my Managed Medicaid patients failing the diabetes metric. I want to go ahead and get her on my schedule. If my schedule is only open after that planned visit with Levada Dy, that's just fine.

## 2022-09-19 ENCOUNTER — Telehealth: Payer: Self-pay

## 2022-09-19 NOTE — Telephone Encounter (Signed)
Patient attempted to be outreached by Donney Rankins, PharmD Candidate on 09/19/2022 to discuss hypertension. Left voicemail for patient to return our call at their convenience at [phone].   [place] [facility]  PharmD Candidate 2024   Maryan Puls, PharmD PGY-1 [facility] Pharmacy Resident

## 2022-09-21 ENCOUNTER — Other Ambulatory Visit: Payer: Self-pay

## 2022-09-21 NOTE — Telephone Encounter (Signed)
Pt returned Allyson's or Meaghan's  call / please advise

## 2022-09-21 NOTE — Telephone Encounter (Signed)
Patient attempted to be outreached by Junius Finner, PharmD Candidate on 09/21/2022 to discuss hypertension. Left voicemail for patient to return our call at their convenience at [phone].   [place] [facility] Pharmacy  PharmD Candidate 2024   Maryan Puls, PharmD PGY-1 [facility] Pharmacy Resident

## 2022-09-26 ENCOUNTER — Other Ambulatory Visit: Payer: Self-pay | Admitting: Pharmacist

## 2022-09-26 NOTE — Progress Notes (Signed)
Received return call from patient. See additional encounter.

## 2022-09-26 NOTE — Progress Notes (Signed)
Patient appearing on report for [place] Metric - Hypertension Control report due to last documented ambulatory blood pressure of 142/92 on 06/06/22. Next appointment with PCP is with Freeman Caldron 10/04/22   Outreached patient to discuss hypertension control and medication management.   Also discussed collaboration with PharmD for DM management. Patient agreed. Appointment scheduled. Patient recently transitioned to Managed Medicaid plan.   Current antihypertensives: carvedilol 25 mg twice daily, hydralazine 10 mg twice daily, Entresto 97/103 twice daily, spironolactone 12.5 mg daily  Now that patient has Medicaid, she does not need to continue to get Entresto from PAP .  Patient does not have an automated upper arm home BP machine.  Current antihyperglycemics: Ozempic 0.25 mg weekly, Basaglar 25 units twice daily, glipizide 10 mg twice daily   Assessment/Plan: - Currently uncontrolled - - Reviewed goal blood pressure <130/80 - Collaborated to schedule with PharmD. Discussed benefit of titration of Ozempic moving forward. Will need to switch to Lantus due to formulary preference when she completes current supply. Consider CGM due to insulin use.  - Patient notes she is out of rosuvastatin and has been out of [place] for a while. She is to call and schedule follow up with Dr. Ellyn Hack, but will collaborate with PharmD to complete PA on Medicaid for [place].    Patient confirms she has transportation to appointment next week.   Catie Hedwig Morton, PharmD, [place], [facility] [phone]

## 2022-09-26 NOTE — Telephone Encounter (Signed)
Patient attempted to be outreached by Donney Rankins, PharmD Candidate on 09/26/2022 to discuss hypertension. Patient had returned our call from last week. Left voicemail for patient to return our call at their convenience at [phone].   [employer] [facility]  PharmD Candidate 2024   Maryan Puls, PharmD PGY-1 [facility] Pharmacy Resident

## 2022-10-04 ENCOUNTER — Other Ambulatory Visit: Payer: Self-pay

## 2022-10-04 ENCOUNTER — Encounter: Payer: Self-pay | Admitting: Physician Assistant

## 2022-10-04 ENCOUNTER — Ambulatory Visit: Payer: Medicaid Other | Attending: Physician Assistant | Admitting: Physician Assistant

## 2022-10-04 VITALS — BP 180/80 | HR 56 | Wt 237.0 lb

## 2022-10-04 DIAGNOSIS — Z91199 Patient's noncompliance with other medical treatment and regimen due to unspecified reason: Secondary | ICD-10-CM

## 2022-10-04 DIAGNOSIS — I10 Essential (primary) hypertension: Secondary | ICD-10-CM

## 2022-10-04 DIAGNOSIS — I25110 Atherosclerotic heart disease of native coronary artery with unstable angina pectoris: Secondary | ICD-10-CM | POA: Diagnosis not present

## 2022-10-04 DIAGNOSIS — Z794 Long term (current) use of insulin: Secondary | ICD-10-CM

## 2022-10-04 DIAGNOSIS — Z7985 Long-term (current) use of injectable non-insulin antidiabetic drugs: Secondary | ICD-10-CM

## 2022-10-04 DIAGNOSIS — E1165 Type 2 diabetes mellitus with hyperglycemia: Secondary | ICD-10-CM | POA: Diagnosis not present

## 2022-10-04 LAB — POCT GLYCOSYLATED HEMOGLOBIN (HGB A1C): HbA1c, POC (controlled diabetic range): 11.6 % — AB (ref 0.0–7.0)

## 2022-10-04 LAB — GLUCOSE, POCT (MANUAL RESULT ENTRY): POC Glucose: 261 mg/dl — AB (ref 70–99)

## 2022-10-04 MED ORDER — CLOPIDOGREL BISULFATE 75 MG PO TABS
75.0000 mg | ORAL_TABLET | Freq: Every day | ORAL | 0 refills | Status: DC
  Filled 2022-10-04: qty 90, 90d supply, fill #0

## 2022-10-04 MED ORDER — HYDRALAZINE HCL 10 MG PO TABS
10.0000 mg | ORAL_TABLET | Freq: Two times a day (BID) | ORAL | 3 refills | Status: DC
  Filled 2022-10-04: qty 180, 90d supply, fill #0
  Filled 2022-12-27: qty 60, 30d supply, fill #1
  Filled 2023-01-30: qty 60, 30d supply, fill #2
  Filled 2023-02-28: qty 60, 30d supply, fill #3
  Filled 2023-03-29: qty 60, 30d supply, fill #4

## 2022-10-04 MED ORDER — SPIRONOLACTONE 25 MG PO TABS
12.5000 mg | ORAL_TABLET | Freq: Every day | ORAL | 1 refills | Status: DC
  Filled 2022-10-04: qty 45, 90d supply, fill #0
  Filled 2022-12-27: qty 15, 30d supply, fill #1
  Filled 2023-01-30: qty 15, 30d supply, fill #2
  Filled 2023-02-28: qty 15, 30d supply, fill #3
  Filled 2023-03-29: qty 15, 30d supply, fill #4
  Filled 2023-04-26 – 2023-04-27 (×2): qty 15, 30d supply, fill #5
  Filled 2023-05-29: qty 15, 30d supply, fill #6
  Filled 2023-06-29: qty 15, 30d supply, fill #7
  Filled 2023-07-31 (×2): qty 15, 30d supply, fill #8
  Filled 2023-08-30: qty 15, 30d supply, fill #9

## 2022-10-04 MED ORDER — CARVEDILOL 25 MG PO TABS
25.0000 mg | ORAL_TABLET | Freq: Two times a day (BID) | ORAL | 1 refills | Status: DC
  Filled 2022-10-04: qty 180, 90d supply, fill #0
  Filled 2023-01-30: qty 60, 30d supply, fill #1
  Filled 2023-02-28: qty 60, 30d supply, fill #2
  Filled 2023-03-29: qty 60, 30d supply, fill #3

## 2022-10-04 MED ORDER — GLIPIZIDE 10 MG PO TABS
10.0000 mg | ORAL_TABLET | Freq: Two times a day (BID) | ORAL | 3 refills | Status: DC
  Filled 2022-10-04: qty 60, 30d supply, fill #0

## 2022-10-04 NOTE — Patient Instructions (Signed)
Check blood sugars fasting and bedtime.  Drink 80-100 ounces water daily.  Set a reminder so you remember BOTH doses of insulin and resume glipizide.   Check blood pressures daily and record.  Bring to next visit.

## 2022-10-05 ENCOUNTER — Other Ambulatory Visit: Payer: Self-pay

## 2022-10-06 ENCOUNTER — Other Ambulatory Visit: Payer: Self-pay

## 2022-10-09 ENCOUNTER — Other Ambulatory Visit: Payer: Self-pay

## 2022-10-10 ENCOUNTER — Ambulatory Visit
Admission: RE | Admit: 2022-10-10 | Discharge: 2022-10-10 | Disposition: A | Discharge status: Home / Self Care | Payer: Medicaid Other | Source: Ambulatory Visit | Attending: Nurse Practitioner | Admitting: Nurse Practitioner

## 2022-10-10 DIAGNOSIS — Z1231 Encounter for screening mammogram for malignant neoplasm of breast: Secondary | ICD-10-CM

## 2022-10-13 ENCOUNTER — Other Ambulatory Visit: Payer: Self-pay | Admitting: Nurse Practitioner

## 2022-10-13 DIAGNOSIS — R928 Other abnormal and inconclusive findings on diagnostic imaging of breast: Secondary | ICD-10-CM

## 2022-10-16 ENCOUNTER — Other Ambulatory Visit: Payer: Self-pay | Admitting: Nurse Practitioner

## 2022-10-16 DIAGNOSIS — R928 Other abnormal and inconclusive findings on diagnostic imaging of breast: Secondary | ICD-10-CM

## 2022-10-16 DIAGNOSIS — E1165 Type 2 diabetes mellitus with hyperglycemia: Secondary | ICD-10-CM

## 2022-10-16 DIAGNOSIS — I10 Essential (primary) hypertension: Secondary | ICD-10-CM

## 2022-10-20 ENCOUNTER — Encounter: Payer: Self-pay | Admitting: Nurse Practitioner

## 2022-10-20 ENCOUNTER — Telehealth: Payer: Self-pay | Admitting: Family Medicine

## 2022-10-20 NOTE — Telephone Encounter (Signed)
Copied from [place]. Topic: General - Other >> Oct 20, 2022 11:04 AM [place] B wrote: Reason for CRM: Lilia Pro from the [facility] called in , needs Dr Margarita Rana to sign the order for the breast exam

## 2022-10-20 NOTE — Telephone Encounter (Signed)
FYI

## 2022-10-21 ENCOUNTER — Telehealth: Payer: Self-pay | Admitting: Family Medicine

## 2022-10-21 NOTE — Telephone Encounter (Signed)
Signed.

## 2022-10-21 NOTE — Telephone Encounter (Signed)
Copied from [place] [phone] [phone]. Topic: General - Inquiry >> Oct 20, 2022  4:56 PM [place] George T wrote: Reason for CRM: Lilia Pro called from the [facility] and needs Dr Raul Del to sign off on the order Digital Diagnostic Unilat R (Order [id number]). Patient is scheduled for Monday March 25th at 8:50.

## 2022-10-23 ENCOUNTER — Other Ambulatory Visit: Payer: Self-pay | Admitting: Nurse Practitioner

## 2022-10-23 ENCOUNTER — Ambulatory Visit
Admission: RE | Admit: 2022-10-23 | Discharge: 2022-10-23 | Disposition: A | Discharge status: Home / Self Care | Payer: Medicaid Other | Source: Ambulatory Visit | Attending: Nurse Practitioner | Admitting: Nurse Practitioner

## 2022-10-23 DIAGNOSIS — R928 Other abnormal and inconclusive findings on diagnostic imaging of breast: Secondary | ICD-10-CM

## 2022-11-07 ENCOUNTER — Encounter: Payer: Self-pay | Admitting: Pharmacist

## 2022-11-07 ENCOUNTER — Ambulatory Visit: Payer: 59 | Attending: Family Medicine | Admitting: Pharmacist

## 2022-11-07 ENCOUNTER — Other Ambulatory Visit: Payer: Self-pay

## 2022-11-07 VITALS — BP 118/73 | HR 51

## 2022-11-07 DIAGNOSIS — E1165 Type 2 diabetes mellitus with hyperglycemia: Secondary | ICD-10-CM | POA: Diagnosis not present

## 2022-11-07 DIAGNOSIS — I25110 Atherosclerotic heart disease of native coronary artery with unstable angina pectoris: Secondary | ICD-10-CM | POA: Diagnosis not present

## 2022-11-07 MED ORDER — FREESTYLE LIBRE 2 SENSOR MISC
6 refills | Status: DC
  Filled 2022-11-07: qty 2, 28d supply, fill #0
  Filled 2022-12-04 – 2022-12-06 (×3): qty 2, 28d supply, fill #1

## 2022-11-07 MED ORDER — LANTUS SOLOSTAR 100 UNIT/ML ~~LOC~~ SOPN
20.0000 [IU] | PEN_INJECTOR | Freq: Every day | SUBCUTANEOUS | 2 refills | Status: DC
  Filled 2022-11-07: qty 15, 75d supply, fill #0
  Filled 2023-01-23: qty 6, 30d supply, fill #1

## 2022-11-07 MED ORDER — FREESTYLE LIBRE 2 READER DEVI
0 refills | Status: DC
  Filled 2022-11-07: qty 1, 30d supply, fill #0

## 2022-11-07 MED ORDER — TECHLITE PEN NEEDLES 32G X 4 MM MISC
2 refills | Status: AC
  Filled 2022-11-07: qty 100, 90d supply, fill #0
  Filled 2023-06-29: qty 100, 30d supply, fill #1
  Filled 2023-09-26: qty 100, 30d supply, fill #2

## 2022-11-07 MED ORDER — ROSUVASTATIN CALCIUM 40 MG PO TABS
40.0000 mg | ORAL_TABLET | Freq: Every day | ORAL | 1 refills | Status: DC
  Filled 2022-11-07: qty 90, 90d supply, fill #0
  Filled 2023-01-30: qty 30, 30d supply, fill #1
  Filled 2023-03-05: qty 30, 30d supply, fill #2
  Filled 2023-03-29: qty 30, 30d supply, fill #3

## 2022-11-07 MED ORDER — OZEMPIC (0.25 OR 0.5 MG/DOSE) 2 MG/3ML ~~LOC~~ SOPN
0.2500 mg | PEN_INJECTOR | SUBCUTANEOUS | 5 refills | Status: DC
  Filled 2022-11-07: qty 3, 28d supply, fill #0
  Filled 2022-12-27: qty 3, 28d supply, fill #1

## 2022-11-07 MED ORDER — BASAGLAR KWIKPEN 100 UNIT/ML ~~LOC~~ SOPN
25.0000 [IU] | PEN_INJECTOR | Freq: Every day | SUBCUTANEOUS | 1 refills | Status: DC
  Filled 2022-11-07: qty 15, 60d supply, fill #0

## 2022-11-07 NOTE — Progress Notes (Signed)
S:     No chief complaint on file.  52 y.o. female who presents for diabetes evaluation, education, and management.  PMH is significant for history of type 2 diabetes mellitus (A1c 11.6), hypertension, CHF (EF 50 to 55% from echo of 02/2021), CAD (status post PCI to LAD with 3 overlapping DES in 09/2017), STEMI in 06/2018.  Patient was referred and last seen by Marylene Land on 10/04/2022. BP was 180/80 mmHg at that appointment. A1c also found to be elevated.   Today, patient arrives in good spirits and presents without any assistance. Patient reports Diabetes was is longstanding. She has a significant cardiovascular history. She also admits today to suboptimal medication adherence. She has been taking her glipizide but has not been taking Ozempic or her insulin. She is also not checking her blood sugars consistently. She tells me she knows what to do concerning her medication adherence and dietary indiscretion but is unmotivated to make changes.   Regarding her high blood pressure, she denies any symptoms currently. She tells me that she has occasional episodes of presyncope and dizziness. Her hydralazine dose was recently decreased d/t this. She gets Entresto via assistance. He endorses compliance with carvedilol, but is taking this on an empty stomach in the mornings. She is also taking her spironolactone as prescribed. Endorses compliance with salt restriction and denies drinking excessive amounts of caffeine.   Family/Social History:  -Fhx: DM -Tobacco: current 0.25 PPD smoker   -Alcohol: none reported   Current diabetes medications include: Basaglar 25 units BID (not taking), glipizide 10 mg BID, Ozempic 0.25 mg weekly (not taking) Current hypertension medications include: carvedilol 25 mg BID, Entresto 97-103 mg BID, hydralazine 10 mg BID, spironolactone 12.5 mg daily  Current hyperlipidemia medications include: Repatha (not taking), rosuvastatin 40 mg daily   Patient denies adherence to  taking all medications as prescribed. She is not taking her Basaglar, Ozempic or Repatha. She endorses compliance to her rosuvastatin and antihypertensives.   Insurance coverage: Managed Medicaid  Patient denies hypoglycemic events.  Reported home fasting blood sugars: not checking   Reported 2 hour post-meal/random blood sugars: not checking  Patient denies nocturia (nighttime urination).  Patient reports neuropathy (nerve pain). Patient denies visual changes. Patient reports self foot exams.   Patient reported dietary habits:  -Admits to dietary indiscretion -Eats fast food regularly due to stress and business of her schedule.   Patient-reported exercise habits:  -None   O:   ROS  Physical Exam  7 day average blood glucose: no GM present at today's visit.  No CGM in place.    Lab Results  Component Value Date   HGBA1C 11.6 (A) 10/04/2022   There were no vitals filed for this visit.  Lipid Panel     Component Value Date/Time   CHOL 113 10/18/2021 1057   TRIG 79 10/18/2021 1057   HDL 48 10/18/2021 1057   CHOLHDL 2.4 10/18/2021 1057   CHOLHDL 4.2 07/05/2018 0330   VLDL 15 07/05/2018 0330   LDLCALC 49 10/18/2021 1057    Clinical Atherosclerotic Cardiovascular Disease (ASCVD): No  The ASCVD Risk score (Arnett DK, et al., 2019) failed to calculate for the following reasons:   The patient has a prior MI or stroke diagnosis   Patient is participating in a Managed Medicaid Plan:  Yes   A/P: Diabetes longstanding currently uncontrolled mainly d/t nonadherence, dietary indiscretion, and physical inactivity. Patient is able to verbalize appropriate hypoglycemia management plan. Will restart basal insulin and weekly Ozempic.  We will stop glipizide to simplify her regimen. Also, I question the benefit she's receiving from that at this time.  -Restarted Lantus at 20u once daily.   -Restarted Ozempic 0.25 mg weekly.  -Discontinued glipizide.  -She cannot tolerate  metformin. SGLT-2i is not an option at this time d/t uncontrolled hyperglycemia.  -Patient educated on purpose, proper use, and potential adverse effects of Lantus, Ozempic. -We should be able to get [place] approved. Rxs sent to our pharmacy.  -Extensively discussed pathophysiology of diabetes, recommended lifestyle interventions, dietary effects on blood sugar control.  -Counseled on s/sx of and management of hypoglycemia.  -Next A1c anticipated 12/2022.   ASCVD risk - secondary prevention in patient with diabetes. Last LDL is at goal of <55 mg/dL. She is very high risk d/t her ASCVD hx + HTN, HLD, T2DM, smoking. High intensity statin indicated. PCSK-9i indicated. She will reach out to her Cardiologist to get back on this. -Continued rosuvastatin 40 mg.   Hypertension longstanding currently controlled. Blood pressure goal of <130/80 mmHg. Medication adherence reported. She is bradycardic but not currently symptomatic. Does endorse occasional symptoms at home. She is taking carvedilol on an empty stomach. Recommend she take this with food to delay absorption. If brady at f/u visit, we may need to decrease carvedilol dose.  -Continued current antihypertensive regimen.  Written patient instructions provided. Patient verbalized understanding of treatment plan.  Total time in face to face counseling 30 minutes.    Follow-up:  Pharmacist in 1 month.   Butch Penny, PharmD, [place], CPP Clinical Pharmacist [facility] [phone]

## 2022-11-08 ENCOUNTER — Other Ambulatory Visit: Payer: Self-pay

## 2022-11-13 ENCOUNTER — Telehealth: Payer: Self-pay | Admitting: Pharmacist Clinician (PhC)/ Clinical Pharmacy Specialist

## 2022-11-13 NOTE — Telephone Encounter (Signed)
LMOM for Patient to call [employer].  Signature missing from one form.

## 2022-11-14 LAB — HM DIABETES EYE EXAM

## 2022-11-15 NOTE — Progress Notes (Signed)
Patient ID: Amy Chaney, female   DOB: 1970/08/27, 52 y.o.   MRN: [id number]   Amy Chaney, is a 52 y.o. female  [id number]  [id number]  DOB - March 09, 1971  Chief Complaint  Patient presents with   Diabetes   Medication Refill   Hypertension       Subjective:   Amy Chaney is a 52 y.o. female here today for med RF and check up on diabetes.  She admits poor compliance to medication regimen.  No new issues or concerns.  Denies HA/dizziness/CP.  No problems updated.  ALLERGIES: Allergies  Allergen Reactions   Farxiga [Dapagliflozin] Other (See Comments)    Caused UTI and polyps on kidneys     PAST MEDICAL HISTORY: Past Medical History:  Diagnosis Date   Coronary artery disease involving native coronary artery of native heart with angina pectoris 01/2013   a. s/p PCI of the mid RCA 01/2013 //  b. LHC 9/14: EF 55%, RCA stent 100% occluded, LAD irregularities >>  subacute stent thrombosis, Promus DES PCI distal overlap // c) 09/2017 - Ant STEMI - LAD-D2 PCI - Successful PCI of LAD (3 overlapping DES), unable to restore flow down D2l that was stented as well.  Likely related to downstream dissection, but unable to rewire.   Daily headache    Depression    Dyslipidemia, goal LDL below 70    History of Doppler ultrasound    carotid bruit >> a. 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  EF down to 20-25% global hypokinesis.  Was in the setting of grieving over the loss of her son from MI.  ->  EF improved to 45-50% on follow-up echocardiogram.   Hypertension    Ischemic cardiomyopathy 06/2018   s/p inferior STEMI followed by 2 anterior STEMIs: Following recovery from Takotsubo Cardiomyopathy- Echo 02/14/19: Moderate LVH.  EF improved up to 45-50%.  GR 1 DD.  Severe apical akinesis.  Normal valves.   Mild dysplasia of cervix (CIN I) 02/04/2019   Seen after  colposcopy on 01/23/19 done for ASCU +HPV pap  > repeat pap and HPV test in 12 and 24 months   STEMI involving left anterior descending coronary artery 09/2017; 06/2018   a) Severe [place] 1, 1, 1 LAD-D2 lesion (complicated by post PTCA dissection/intramural hematoma) -successful extensive PCI of the LAD but unable to maintain patency of the stented D2. b) distal mLAD stent Edge 100% thrombosis --> PTCA & Overlapping DES PCI (Synergy 3 x 12 --> 3.6-3.3 mm). D2 occluded. RCA stents patent, 65 % OM2. EF 30-35%.   STEMI involving oth coronary artery of inferior wall 03/2013   Secondary to subacute stent thrombosis of RCA stent segment having missed 4 doses of Effient.   Tobacco abuse 01/11/2016   Type II diabetes mellitus 12/2010   sister and son also diabetic     MEDICATIONS AT HOME: Prior to Admission medications   Medication Sig Start Date End Date Taking? Authorizing Provider  furosemide (LASIX) 20 MG tablet Take 1 tablet (20 mg total) by mouth as needed. 03/17/22 03/17/23 Yes Azalee Course, PA  Multiple Vitamins-Minerals (WOMENS 50+ MULTI VITAMIN PO) Take 1 tablet by mouth daily.   Yes [provider]  naproxen sodium (ALEVE) 220 MG tablet Take 220 mg by mouth daily as needed (pain).   Yes [provider]  nitroGLYCERIN (NITROSTAT) 0.4 MG SL tablet Place 1  tablet (0.4 mg total) under the tongue every 5 (five) minutes as needed for chest pain. Reported on 11/25/2015 07/06/18  Yes Bhagat, Bhavinkumar, PA  sacubitril-valsartan (ENTRESTO) 97-103 MG Take 1 tablet by mouth 2 (two) times daily. 09/06/22  Yes Marykay Lex, MD  TRUEPLUS LANCETS 28G MISC 28 g by Does not apply route 4 (four) times daily. 07/06/18  Yes Rai, Ripudeep K, MD  carvedilol (COREG) 25 MG tablet Take 1 tablet (25 mg total) by mouth 2 (two) times daily. 10/04/22 04/02/23  Anders Simmonds, PA-C  clopidogrel (PLAVIX) 75 MG tablet Take 1 tablet (75 mg total) by mouth daily. 10/04/22   Anders Simmonds, PA-C  Continuous Blood  Gluc Receiver (FREESTYLE LIBRE 2 READER) DEVI Use to check blood sugar throughout the day. E11.65 11/07/22   Hoy Register, MD  Continuous Blood Gluc Sensor (FREESTYLE LIBRE 2 SENSOR) MISC Use to check blood sugar throughout the day. E11.65 11/07/22   Hoy Register, MD  Evolocumab (REPATHA SURECLICK) 140 MG/ML SOAJ Inject 140 mg into the skin every 14 (fourteen) days. Inject one pen into the subcutaneous tissue every 14 days    [provider]  gabapentin (NEURONTIN) 300 MG capsule Take 1 capsule (300 mg total) by mouth 3 (three) times daily. Patient not taking: Reported on 09/26/2022 11/16/21 06/14/22  Hoy Register, MD  hydrALAZINE (APRESOLINE) 10 MG tablet Take 1 tablet (10 mg total) by mouth in the morning and at bedtime. 10/04/22   Anders Simmonds, PA-C  insulin glargine (LANTUS SOLOSTAR) 100 UNIT/ML Solostar Pen Inject 20 Units into the skin daily. 11/07/22   Hoy Register, MD  Insulin Pen Needle (TECHLITE PEN NEEDLES) 32G X 4 MM MISC Use 1 pen needle with insulin once daily 11/07/22   Hoy Register, MD  rosuvastatin (CRESTOR) 40 MG tablet Take 1 tablet (40 mg total) by mouth daily. 11/07/22   Hoy Register, MD  Semaglutide,0.25 or 0.5MG /DOS, (OZEMPIC, 0.25 OR 0.5 MG/DOSE,) 2 MG/3ML SOPN Inject 0.25 mg into the skin once a week. 11/07/22   Hoy Register, MD  spironolactone (ALDACTONE) 25 MG tablet Take 0.5 tablets (12.5 mg total) by mouth daily. 10/04/22 10/04/23  Anders Simmonds, PA-C  buPROPion (WELLBUTRIN SR) 150 MG 12 hr tablet Take 1 tablet (150 mg total) by mouth 2 (two) times daily. 11/17/19 07/20/20  Hoy Register, MD    ROS: Neg HEENT Neg resp Neg cardiac Neg GI Neg GU Neg MS Neg psych Neg neuro  Objective:   Vitals:   10/04/22 1601 10/04/22 1622  BP: (!) 181/88 (!) 180/80  Pulse: (!) 56   SpO2: 100%   Weight: 237 lb (107.5 kg)    Exam General appearance : Awake, alert, not in any distress. Speech Clear. Not toxic looking HEENT: Atraumatic and  Normocephalic Neck: Supple, no JVD. No cervical lymphadenopathy.  Chest: Good air entry bilaterally, CTAB.  No rales/rhonchi/wheezing CVS: S1 S2 regular, no murmurs.  Extremities: B/L Lower Ext shows no edema, both legs are warm to touch Neurology: Awake alert, and oriented X 3, CN II-XII intact, Non focal Skin: No Rash  Data Review Lab Results  Component Value Date   HGBA1C 11.6 (A) 10/04/2022   HGBA1C 8.6 (A) 11/16/2021   HGBA1C 8.9 (A) 04/27/2021    Assessment & Plan   1. Uncontrolled type 2 diabetes mellitus with hyperglycemia (HCC) - Glucose (CBG) - HgB A1c Medication adherence and check blood sugars and see Luke in a few weeks.  I'm not adding adjusting to due poor  adherence to current regimen  2. Primary hypertension - carvedilol (COREG) 25 MG tablet; Take 1 tablet (25 mg total) by mouth 2 (two) times daily.  Dispense: 180 tablet; Refill: 1 - spironolactone (ALDACTONE) 25 MG tablet; Take 0.5 tablets (12.5 mg total) by mouth daily.  Dispense: 90 tablet; Refill: 1 - hydrALAZINE (APRESOLINE) 10 MG tablet; Take 1 tablet (10 mg total) by mouth in the morning and at bedtime.  Dispense: 180 tablet; Refill: 3  3. Coronary artery disease involving native coronary artery of native heart with unstable angina pectoris (HCC) - clopidogrel (PLAVIX) 75 MG tablet; Take 1 tablet (75 mg total) by mouth daily.  Dispense: 90 tablet; Refill: 0  4. H/O noncompliance with medical treatment, presenting hazards to health Compliance imperative.  Discussed at length   Return for 1 month with Like DM and htn; 3 months PCP.  The patient was given clear instructions to go to ER or return to medical center if symptoms don't improve, worsen or new problems develop. The patient verbalized understanding. The patient was told to call to get lab results if they haven't heard anything in the next week.      Georgian Co, PA-C [facility] [place],  [place] [phone]   11/15/2022, 1:41 PM

## 2022-12-04 ENCOUNTER — Other Ambulatory Visit: Payer: Self-pay | Admitting: Physician Assistant

## 2022-12-04 ENCOUNTER — Other Ambulatory Visit: Payer: Self-pay

## 2022-12-04 ENCOUNTER — Other Ambulatory Visit: Payer: Self-pay | Admitting: Family Medicine

## 2022-12-04 DIAGNOSIS — E1165 Type 2 diabetes mellitus with hyperglycemia: Secondary | ICD-10-CM

## 2022-12-04 NOTE — Telephone Encounter (Signed)
Are we able to start a PA for this?

## 2022-12-05 ENCOUNTER — Other Ambulatory Visit: Payer: Self-pay

## 2022-12-06 ENCOUNTER — Telehealth: Payer: Self-pay | Admitting: Family Medicine

## 2022-12-06 ENCOUNTER — Other Ambulatory Visit: Payer: Self-pay | Admitting: Pharmacist

## 2022-12-06 ENCOUNTER — Other Ambulatory Visit: Payer: Self-pay

## 2022-12-06 MED ORDER — DEXCOM G7 SENSOR MISC
6 refills | Status: DC
  Filled 2022-12-06: qty 3, 30d supply, fill #0

## 2022-12-06 MED ORDER — DEXCOM G7 RECEIVER DEVI
0 refills | Status: DC
  Filled 2022-12-06: qty 1, 30d supply, fill #0

## 2022-12-06 NOTE — Telephone Encounter (Signed)
Pt is calling in because her Freestyle Libre isn't covered by insurance, but the Dexcom G7 is. Pt says she is fine with the Dexcom G7.

## 2022-12-07 ENCOUNTER — Other Ambulatory Visit: Payer: Self-pay

## 2022-12-12 ENCOUNTER — Encounter: Payer: Self-pay | Admitting: Pharmacist

## 2022-12-12 ENCOUNTER — Ambulatory Visit: Payer: 59 | Attending: Family Medicine | Admitting: Pharmacist

## 2022-12-12 DIAGNOSIS — Z794 Long term (current) use of insulin: Secondary | ICD-10-CM

## 2022-12-12 DIAGNOSIS — I10 Essential (primary) hypertension: Secondary | ICD-10-CM | POA: Diagnosis not present

## 2022-12-12 DIAGNOSIS — E1165 Type 2 diabetes mellitus with hyperglycemia: Secondary | ICD-10-CM

## 2022-12-12 DIAGNOSIS — Z7984 Long term (current) use of oral hypoglycemic drugs: Secondary | ICD-10-CM

## 2022-12-12 NOTE — Progress Notes (Signed)
S:     No chief complaint on file.  52 y.o. female who presents for diabetes evaluation, education, and management.  PMH is significant for history of type 2 diabetes mellitus (A1c 11.6), hypertension, CHF (EF 50 to 55% from echo of 02/2021), CAD (status post PCI to LAD with 3 overlapping DES in 09/2017), STEMI in 06/2018.   Patient was referred and last seen by Marylene Land on 10/04/2022. I saw her on 11/07/22. BP was at goal and she reported adherence to her antihypertensive regimen. She was not fully compliant to her DM regimen. We restarted Lantus and Ozempic for her diabetes. We took off glipizide.   Today, she reports no NV, or abominable pain. Denies any changes in her vision. Regarding her insulin, she admits to maybe missing ~ 2-3 doses of insulin. Unfortunately, she is still taking glipizide and is endorsing daily hypoglycemia. No additional complaints today.    Family/Social History:  -Fhx: DM -Tobacco: current 0.25 PPD smoker   -Alcohol: none reported   Current diabetes medications include: Lantus 20u daily, Ozempic 0.25 mg weekly - of note, she is still taking glipizide 10 mg BID Current hypertension medications include: carvedilol 25 mg BID, Entresto 97-103 mg BID, hydralazine 10 mg BID, spironolactone 12.5 mg daily  Current hyperlipidemia medications include: Repatha (not taking), rosuvastatin 40 mg daily   Patient reports adherence to taking all medications as prescribed.  Insurance coverage: Managed Medicaid  Patient endorses daily hypoglycemic events. Review of CGM shows BG levels in the 60s-70s in the mornings and late afternoons for the past several days.   Reported home fasting blood sugars: 60s - 115 Reported 2 hour post-meal/random blood sugars: 200s  Patient denies nocturia (nighttime urination).  Patient reports neuropathy (nerve pain). Patient denies visual changes. Patient reports self foot exams.   Patient reported dietary habits:  -Admits to dietary  indiscretion -Eats fast food regularly due to stress and business of her schedule.   Patient-reported exercise habits:  -None   O:  Information from GM:  7 day average blood glucose: 138 Time in range: 83%  Lab Results  Component Value Date   HGBA1C 11.6 (A) 10/04/2022   There were no vitals filed for this visit.  Lipid Panel     Component Value Date/Time   CHOL 113 10/18/2021 1057   TRIG 79 10/18/2021 1057   HDL 48 10/18/2021 1057   CHOLHDL 2.4 10/18/2021 1057   CHOLHDL 4.2 07/05/2018 0330   VLDL 15 07/05/2018 0330   LDLCALC 49 10/18/2021 1057    Clinical Atherosclerotic Cardiovascular Disease (ASCVD): No  The ASCVD Risk score (Arnett DK, et al., 2019) failed to calculate for the following reasons:   The patient has a prior MI or stroke diagnosis   Patient is participating in a Managed Medicaid Plan:  Yes   A/P: Diabetes longstanding currently uncontrolled based on A1c. Unfortunately, while her home sugars reveal improvement she is experiencing hypoglycemia. She treats successfully. Patient is able to verbalize appropriate hypoglycemia management plan.  I emphasized that she needs to STOP GLIPIZIDE. Will continue basal insulin at current dose and increase her weekly dose of Ozempic. -Continue Lantus at 20u once daily.   -Increase Ozempic to 0.5 mg weekly.  -Discontinued glipizide.  -She cannot tolerate metformin.  -Assuming her sugars continue to improve, we can consider an SGLT-2i in the future. Will hold off to keep things simple for now. -Patient educated on purpose, proper use, and potential adverse effects of Lantus, Ozempic. -Extensively discussed  pathophysiology of diabetes, recommended lifestyle interventions, dietary effects on blood sugar control.  -Counseled on s/sx of and management of hypoglycemia.  -Next A1c anticipated 12/2022.   Written patient instructions provided. Patient verbalized understanding of treatment plan.  Total time in face to face  counseling 30 minutes.    Follow-up:  Pharmacist in July. PCP next month.   Butch Penny, PharmD, P[place]y [place], CPP Clinical Pharmacist [facility] [phone]

## 2022-12-14 ENCOUNTER — Other Ambulatory Visit: Payer: Self-pay | Admitting: Pharmacist

## 2022-12-14 ENCOUNTER — Other Ambulatory Visit: Payer: Self-pay

## 2022-12-14 MED ORDER — DEXCOM G7 SENSOR MISC
6 refills | Status: DC
  Filled 2022-12-14: qty 3, fill #0
  Filled 2023-01-04 (×2): qty 3, 30d supply, fill #0
  Filled 2023-01-26 – 2023-01-29 (×2): qty 3, 30d supply, fill #1
  Filled 2023-03-05: qty 3, 30d supply, fill #2
  Filled 2023-03-29: qty 3, 30d supply, fill #3
  Filled 2023-05-10: qty 3, 30d supply, fill #4
  Filled 2023-06-13: qty 3, 30d supply, fill #5
  Filled 2023-07-13: qty 3, 30d supply, fill #6

## 2022-12-14 MED ORDER — DEXCOM G7 RECEIVER DEVI
0 refills | Status: DC
  Filled 2022-12-14: qty 1, fill #0

## 2022-12-15 ENCOUNTER — Other Ambulatory Visit: Payer: Self-pay

## 2022-12-27 ENCOUNTER — Other Ambulatory Visit: Payer: Self-pay

## 2022-12-28 ENCOUNTER — Other Ambulatory Visit: Payer: Self-pay | Admitting: Pharmacist

## 2022-12-28 ENCOUNTER — Other Ambulatory Visit: Payer: Self-pay

## 2022-12-28 MED ORDER — TRULICITY 1.5 MG/0.5ML ~~LOC~~ SOAJ
1.5000 mg | SUBCUTANEOUS | 1 refills | Status: DC
  Filled 2022-12-28: qty 6, 84d supply, fill #0
  Filled 2023-01-23: qty 2, 28d supply, fill #0
  Filled 2023-02-28: qty 2, 28d supply, fill #1
  Filled 2023-03-29: qty 2, 28d supply, fill #2
  Filled 2023-04-26 – 2023-04-27 (×2): qty 2, 28d supply, fill #3
  Filled 2023-05-23: qty 2, 28d supply, fill #4
  Filled 2023-07-06: qty 2, 28d supply, fill #5

## 2022-12-28 MED ORDER — TRULICITY 0.75 MG/0.5ML ~~LOC~~ SOAJ
0.7500 mg | SUBCUTANEOUS | 0 refills | Status: DC
  Filled 2022-12-28: qty 2, 28d supply, fill #0

## 2022-12-29 ENCOUNTER — Other Ambulatory Visit: Payer: Self-pay

## 2023-01-01 ENCOUNTER — Other Ambulatory Visit: Payer: Self-pay

## 2023-01-02 ENCOUNTER — Other Ambulatory Visit: Payer: Self-pay

## 2023-01-04 ENCOUNTER — Other Ambulatory Visit: Payer: Self-pay | Admitting: Physician Assistant

## 2023-01-04 ENCOUNTER — Other Ambulatory Visit: Payer: Self-pay

## 2023-01-04 DIAGNOSIS — I25110 Atherosclerotic heart disease of native coronary artery with unstable angina pectoris: Secondary | ICD-10-CM

## 2023-01-04 MED ORDER — CLOPIDOGREL BISULFATE 75 MG PO TABS
75.0000 mg | ORAL_TABLET | Freq: Every day | ORAL | 0 refills | Status: DC
Start: 2023-01-04 — End: 2023-03-29
  Filled 2023-01-04: qty 30, 30d supply, fill #0
  Filled 2023-01-30: qty 30, 30d supply, fill #1
  Filled 2023-02-28: qty 30, 30d supply, fill #2

## 2023-01-04 NOTE — Telephone Encounter (Signed)
Appointment 01/08/23- will RF (patient has been seen in office several times since abn lab) Requested Prescriptions  Pending Prescriptions Disp Refills   clopidogrel (PLAVIX) 75 MG tablet 90 tablet 0    Sig: Take 1 tablet (75 mg total) by mouth daily.     Hematology: Antiplatelets - clopidogrel Failed - 01/04/2023  8:54 AM      Failed - HCT in normal range and within 180 days    HCT  Date Value Ref Range Status  07/06/2022 35.4 (L) 36.0 - 46.0 % Final   Hematocrit  Date Value Ref Range Status  03/17/2022 33.3 (L) 34.0 - 46.6 % Final         Failed - HGB in normal range and within 180 days    Hemoglobin  Date Value Ref Range Status  07/06/2022 11.8 (L) 12.0 - 15.0 g/dL Final  16/05/9603 54.0 (L) 11.1 - 15.9 g/dL Final         Failed - PLT in normal range and within 180 days    Platelets  Date Value Ref Range Status  07/06/2022 252 150 - 400 K/uL Final  03/17/2022 352 150 - 450 x10E3/uL Final         Failed - Cr in normal range and within 360 days    Creat  Date Value Ref Range Status  02/17/2015 0.59 0.50 - 1.10 mg/dL Final   Creatinine, Ser  Date Value Ref Range Status  07/06/2022 1.21 (H) 0.44 - 1.00 mg/dL Final   Creatinine, Urine  Date Value Ref Range Status  02/17/2015 111.9 mg/dL Final    Comment:    No reference range established.         Passed - Valid encounter within last 6 months    Recent Outpatient Visits           3 weeks ago Uncontrolled type 2 diabetes mellitus with hyperglycemia [facility])   [place] [facility] [place], [place] L, RPH-CPP   1 month ago Uncontrolled type 2 diabetes mellitus with hyperglycemia [facility])   [place] [facility] [place], [place] L, RPH-CPP   3 months ago Uncontrolled type 2 diabetes mellitus with hyperglycemia [facility])   [place] [facility] [place], Marzella Schlein, [place]   1 year ago Primary hypertension   [place] [facility] [place], [place] L, RPH-CPP   1 year ago Uncontrolled type 2 diabetes mellitus with hyperglycemia [facility])   [place] [facility] Hoy Register, MD       Future Appointments             In 4 days Hoy Register, MD [facility]   In 3 weeks Lois Huxley, Cornelius Moras, RPH-CPP [facility]

## 2023-01-08 ENCOUNTER — Ambulatory Visit: Payer: 59 | Attending: Family Medicine | Admitting: Family Medicine

## 2023-01-08 ENCOUNTER — Other Ambulatory Visit: Payer: Self-pay

## 2023-01-08 ENCOUNTER — Encounter: Payer: Self-pay | Admitting: Family Medicine

## 2023-01-08 VITALS — BP 138/82 | HR 62 | Ht 69.0 in | Wt 223.4 lb

## 2023-01-08 DIAGNOSIS — I25110 Atherosclerotic heart disease of native coronary artery with unstable angina pectoris: Secondary | ICD-10-CM

## 2023-01-08 DIAGNOSIS — I110 Hypertensive heart disease with heart failure: Secondary | ICD-10-CM | POA: Diagnosis not present

## 2023-01-08 DIAGNOSIS — E1169 Type 2 diabetes mellitus with other specified complication: Secondary | ICD-10-CM

## 2023-01-08 DIAGNOSIS — Z794 Long term (current) use of insulin: Secondary | ICD-10-CM

## 2023-01-08 DIAGNOSIS — F17200 Nicotine dependence, unspecified, uncomplicated: Secondary | ICD-10-CM | POA: Diagnosis not present

## 2023-01-08 DIAGNOSIS — I5042 Chronic combined systolic (congestive) and diastolic (congestive) heart failure: Secondary | ICD-10-CM | POA: Diagnosis not present

## 2023-01-08 LAB — POCT GLYCOSYLATED HEMOGLOBIN (HGB A1C): HbA1c, POC (controlled diabetic range): 7.9 % — AB (ref 0.0–7.0)

## 2023-01-08 MED ORDER — VARENICLINE TARTRATE (STARTER) 0.5 MG X 11 & 1 MG X 42 PO TBPK
ORAL_TABLET | Freq: Every day | ORAL | 0 refills | Status: DC
Start: 2023-01-08 — End: 2023-05-23
  Filled 2023-01-08: qty 53, 30d supply, fill #0

## 2023-01-08 NOTE — Patient Instructions (Signed)

## 2023-01-08 NOTE — Progress Notes (Signed)
Subjective:  Patient ID: Amy Chaney, female    DOB: 1970/12/31  Age: 52 y.o. MRN: [id number]  CC: Diabetes   HPI BURNETTA KOHLS is a 52 y.o. year old female with a history of type 2 diabetes mellitus (A1c 8.6), hypertension, CHF (EF 50 to 55% from echo of 02/2021), CAD (status post PCI to LAD with 3 overlapping DES in 09/2017), STEMI in 06/2018.   Interval History:  Ozempic was changed to Trulicity as insurance would not cover it.  She is currently on 0.75 mg of Trulicity.  CGM data:  7 day average: 167 14 day average: 170 Time in range: 65% High: 28 % Very high: 7% Low: 0% Very low: 0%.  Still smoking 3-4 Cigarettes/ day. She tried Wellbutrin but could not tolerate it. Past Medical History:  Diagnosis Date  . Coronary artery disease involving native coronary artery of native heart with angina pectoris (HCC) 01/2013   a. s/p PCI of the mid RCA 01/2013 //  b. LHC 9/14: EF 55%, RCA stent 100% occluded, LAD irregularities >>  subacute stent thrombosis, Promus DES PCI distal overlap // c) 09/2017 - Ant STEMI - LAD-D2 PCI - Successful PCI of LAD (3 overlapping DES), unable to restore flow down D2l that was stented as well.  Likely related to downstream dissection, but unable to rewire.  . Daily headache   . Depression   . Dyslipidemia, goal LDL below 70   . History of Doppler ultrasound    carotid bruit >> a. Carotid US 6/17: bilat ICA 1-39%  . History of Takotsubo cardiomyopathy 11/2018   Admitted for acute on chronic combined systolic and diastolic heart failure.  EF down to 20-25% global hypokinesis.  Was in the setting of grieving over the loss of her son from MI.  ->  EF improved to 45-50% on follow-up echocardiogram.  . Hypertension   . Ischemic cardiomyopathy 06/2018   s/p inferior STEMI followed by 2 anterior STEMIs: Following recovery from Takotsubo Cardiomyopathy- Echo 02/14/19: Moderate LVH.  EF improved up to 45-50%.  GR 1 DD.  Severe apical akinesis.  Normal valves.   . Mild dysplasia of cervix (CIN I) 02/04/2019   Seen after colposcopy on 01/23/19 done for ASCU +HPV pap  > repeat pap and HPV test in 12 and 24 months  . STEMI involving left anterior descending coronary artery (HCC) 09/2017; 06/2018   a) Severe Medina 1, 1, 1 LAD-D2 lesion (complicated by post PTCA dissection/intramural hematoma) -successful extensive PCI of the LAD but unable to maintain patency of the stented D2. b) distal mLAD stent Edge 100% thrombosis --> PTCA & Overlapping DES PCI (Synergy 3 x 12 --> 3.6-3.3 mm). D2 occluded. RCA stents patent, 65 % OM2. EF 30-35%.  Marland Kitchen STEMI involving oth coronary artery of inferior wall (HCC) 03/2013   Secondary to subacute stent thrombosis of RCA stent segment having missed 4 doses of Effient.  . Tobacco abuse 01/11/2016  . Type II diabetes mellitus (HCC) 12/2010   sister and son also diabetic     Past Surgical History:  Procedure Laterality Date  . CHOLECYSTECTOMY  ~ 2000  . CORONARY STENT INTERVENTION N/A 10/10/2017   Procedure: CORONARY STENT INTERVENTION;  Surgeon: Marykay Lex, MD;  Location: [facility] INVASIVE CV LAB;  Service: Cardiovascular; LAD PCI: STENT SYNERGY DES 3.5X32 (p),STENT SYNERGY DES 3.5X 8 (m), STENT SYNERGY DES 3.5X28 (d).  D2 PCI: STENT SYNERGY DES 2.5X24.  (UNABL TO REWIRE & EXPAND - TO OF DIAG AT  END OF CASE)  . CORONARY STENT INTERVENTION N/A 07/03/2018   Procedure: CORONARY STENT INTERVENTION;  Surgeon: Marykay Lex, MD;  Location: [facility] INVASIVE CV LAB;; PTCA & overlapping DES PCI (overlaps prior stents distally) - Synergy DES 3 x 12 (3.6 mm @ overlap -- 3.3 mm distally)>  . LEFT HEART CATH AND CORONARY ANGIOGRAPHY N/A 10/10/2017   Procedure: LEFT HEART CATH AND CORONARY ANGIOGRAPHY;  Surgeon: Marykay Lex, MD;  Location: [facility] INVASIVE CV LAB;  Service: Cardiovascular;  Laterality: N/A; -patent RCA stents with mild in-stent restenosis. Medina 1,1,1 mLD-D2 90% (complicated by dissection/intramural thrombus)  . LEFT HEART CATH AND  CORONARY ANGIOGRAPHY N/A 07/03/2018   Procedure: LEFT HEART CATH AND CORONARY ANGIOGRAPHY;  Surgeon: Marykay Lex, MD;  Location: [facility] INVASIVE CV LAB;; 100% thrombotic occlusion distal LAD stent -- DES PCI. continued 100% D2 (previously lost). patent RCA stents, 65% OM2.  EF 30-35%.  Marland Kitchen LEFT HEART CATHETERIZATION WITH CORONARY ANGIOGRAM N/A 02/25/2013   Procedure: LEFT HEART CATHETERIZATION WITH CORONARY ANGIOGRAM;  Surgeon: Pamella Pert, MD;  Location: [facility] CATH LAB;  Service: Cardiovascular;; CTO m RCA; otherwise normal coronaries  . LEFT HEART CATHETERIZATION WITH CORONARY ANGIOGRAM N/A 04/01/2013   Procedure: LEFT HEART CATHETERIZATION WITH CORONARY ANGIOGRAM;  Surgeon: Pamella Pert, MD;  Location: [facility] CATH LAB;  Service: Cardiovascular: 100% occlusion of distal RCA stent (subacute stent thrombosis -  secondary to stopping Effient).  Overlapping DES PCI  . NM MYOVIEW LTD  12/2015    EF 38%.  Hypertensive response to exercise (231/132 mmHg).  INTERMEDIATE RISK due to -diffuse hypokinesis and reduced EF.  No ischemia or infarction.  Marland Kitchen PERCUTANEOUS CORONARY STENT INTERVENTION (PCI-S)  04/01/2013   Procedure: PERCUTANEOUS CORONARY STENT INTERVENTION (PCI-S);  Surgeon: Pamella Pert, MD;  Location: [facility] CATH LAB;  Service: Cardiovascular;; PCI of distal RCA stent subacute thrombosis: Promus Premier DES 3.0 mm x 20 mm.  Marland Kitchen PERCUTANEOUS CORONARY STENT INTERVENTION (PCI-S)  02/25/2013   Procedure: PERCUTANEOUS CORONARY STENT INTERVENTION (PCI-S);  Surgeon: Pamella Pert, MD;  Location: [facility] CATH LAB;  RCA CTO PCI with overlapping Promus DES: 3.0 mm x 38 mm, 3.5 mm x 16mm  . RIGHT/LEFT HEART CATH AND CORONARY ANGIOGRAPHY N/A 11/04/2019   Procedure: RIGHT/LEFT HEART CATH AND CORONARY ANGIOGRAPHY;  Surgeon: Marykay Lex, MD;  Location: [facility] INVASIVE CV LAB;; Mildly elevated LVEDP.  Ostial D1 100%.  Proximal to mid LAD stent 10% ISR.  Mid and distal stent widely patent.  30% distal distance.  Ostial OM 2  55%.  Proximal to mid RCA stent 50% stenosis.  Mid to distal RCA 40%.  Suspect that reduced EF is related to hypertensive emergen  . TRANSTHORACIC ECHOCARDIOGRAM  07/04/2018   recurrent Anterior STEMI: EF 35-40%.  Apical hypokinesis.  GRII DD.  No thrombus noted.  (Improved EF from 30% up to 35-40% from previous echo)  . TRANSTHORACIC ECHOCARDIOGRAM  11/2018; 01/2019   a) TAKOTUSBO CM: EF 20-25%-diffuse HK..  Mod V thickness.  GRII DD w/ high LVEDP.  Severe LA dilation;; b) 02/14/19: Moderate LVH.  EF improved up to 45-50%.  GR 1 DD.  Severe apical akinesis.  Normal valves.  . TRANSTHORACIC ECHOCARDIOGRAM  10/31/2019   Hypertensive emergency, hypoxic/hypercapnic respiratory failure;  EF 20 to 25%.  Global HK.  Mildly dilated LV.  Mildly elevated PA pressures.  Mild LA dilation.  Mild AR.  Mildly elevated RAP-8 mmHg.=> Eventual cath showed no change  . TUBAL LIGATION  1994  Family History  Problem Relation Age of Onset  . Diabetes Mother   . Diabetes Sister   . Diabetes Sister   . Diabetes Son        type 1    Social History   Socioeconomic History  . Marital status: Single    Spouse name: Not on file  . Number of children: 3  . Years of education: Not on file  . Highest education level: 11th grade  Occupational History    Employer: [employer]  Tobacco Use  . Smoking status: Every Day    Packs/day: 0.25    Years: 22.00    Additional pack years: 0.00    Total pack years: 5.50    Types: Cigarettes  . Smokeless tobacco: Never  . Tobacco comments:    current smoker 4-5 cigarettes per day; previously smoked at least a pack a day.  Vaping Use  . Vaping Use: Never used  Substance and Sexual Activity  . Alcohol use: Yes    Alcohol/week: 0.0 standard drinks of alcohol    Comment: 07/04/2018 "only on special occasions; maybe once/yr"  . Drug use: Yes    Types: Marijuana    Comment: 02/25/2013 "quit marijuana ~ 2001"  . Sexual activity: Not Currently    Birth control/protection:  Surgical  Other Topics Concern  . Not on file  Social History Narrative   Diet: 50/50 take out and home cook meals (eats burgers and lots of fried foods), limits portions.       Exercise: walks 15-20 minutes twice per week   Social Determinants of Health   Financial Resource Strain: Medium Risk (12/12/2022)   Overall Financial Resource Strain (CARDIA)   . Difficulty of Paying Living Expenses: Somewhat hard  Food Insecurity: Food Insecurity Present (12/12/2022)   Hunger Vital Sign   . Worried About Programme researcher, broadcasting/film/video in the Last Year: Sometimes true   . Ran Out of Food in the Last Year: Sometimes true  Transportation Needs: No Transportation Needs (12/12/2022)   PRAPARE - Transportation   . Lack of Transportation (Medical): No   . Lack of Transportation (Non-Medical): No  Physical Activity: Inactive (12/12/2022)   Exercise Vital Sign   . Days of Exercise per Week: 0 days   . Minutes of Exercise per Session: 0 min  Stress: No Stress Concern Present (12/12/2022)   [employer] of Occupational Health - Occupational Stress Questionnaire   . Feeling of Stress : Only a little  Social Connections: Moderately Integrated (12/12/2022)   Social Connection and Isolation Panel [NHANES]   . Frequency of Communication with Friends and Family: Three times a week   . Frequency of Social Gatherings with Friends and Family: Three times a week   . Attends Religious Services: 1 to 4 times per year   . Active Member of Clubs or Organizations: Yes   . Attends Banker Meetings: 1 to 4 times per year   . Marital Status: Divorced    Allergies  Allergen Reactions  . Farxiga [Dapagliflozin] Other (See Comments)    Caused UTI and polyps on kidneys     Outpatient Medications Prior to Visit  Medication Sig Dispense Refill  . carvedilol (COREG) 25 MG tablet Take 1 tablet (25 mg total) by mouth 2 (two) times daily. 180 tablet 1  . clopidogrel (PLAVIX) 75 MG tablet Take 1 tablet (75 mg  total) by mouth daily. 90 tablet 0  . Continuous Glucose Receiver (DEXCOM G7 RECEIVER) DEVI Use to  check blood sugar throughout the day. E11.65 1 each 0  . Continuous Glucose Sensor (DEXCOM G7 SENSOR) MISC Use to check blood sugar throughout the day. Change sensors once every 10 days. E11.65 3 each 6  . Dulaglutide (TRULICITY) 0.75 MG/0.5ML SOPN Inject 0.75 mg into the skin once a week. Use for 4 weeks, then increase to 1.5 mg weekly dose. 2 mL 0  . Dulaglutide (TRULICITY) 1.5 MG/0.5ML SOPN Inject 1.5 mg into the skin once a week. Start once you finish 4 weeks of the 0.75mg  pen. 6 mL 1  . hydrALAZINE (APRESOLINE) 10 MG tablet Take 1 tablet (10 mg total) by mouth in the morning and at bedtime. 180 tablet 3  . insulin glargine (LANTUS SOLOSTAR) 100 UNIT/ML Solostar Pen Inject 20 Units into the skin daily. 15 mL 2  . Insulin Pen Needle (TECHLITE PEN NEEDLES) 32G X 4 MM MISC Use 1 pen needle with insulin once daily 100 each 2  . Multiple Vitamins-Minerals (WOMENS 50+ MULTI VITAMIN PO) Take 1 tablet by mouth daily.    . naproxen sodium (ALEVE) 220 MG tablet Take 220 mg by mouth daily as needed (pain).    . nitroGLYCERIN (NITROSTAT) 0.4 MG SL tablet Place 1 tablet (0.4 mg total) under the tongue every 5 (five) minutes as needed for chest pain. Reported on 11/25/2015 25 tablet 3  . rosuvastatin (CRESTOR) 40 MG tablet Take 1 tablet (40 mg total) by mouth daily. 90 tablet 1  . sacubitril-valsartan (ENTRESTO) 97-103 MG Take 1 tablet by mouth 2 (two) times daily. 180 tablet 2  . spironolactone (ALDACTONE) 25 MG tablet Take 0.5 tablets (12.5 mg total) by mouth daily. 90 tablet 1  . TRUEPLUS LANCETS 28G MISC 28 g by Does not apply route 4 (four) times daily. 120 each 2  . Evolocumab (REPATHA SURECLICK) 140 MG/ML SOAJ Inject 140 mg into the skin every 14 (fourteen) days. Inject one pen into the subcutaneous tissue every 14 days (Patient not taking: Reported on 01/08/2023)    . furosemide (LASIX) 20 MG tablet Take 1  tablet (20 mg total) by mouth as needed. (Patient not taking: Reported on 01/08/2023) 180 tablet 1  . gabapentin (NEURONTIN) 300 MG capsule Take 1 capsule (300 mg total) by mouth 3 (three) times daily. (Patient not taking: Reported on 09/26/2022) 90 capsule 6   No facility-administered medications prior to visit.     ROS Review of Systems *** Objective:  BP 138/82   Pulse 62   Ht 5\' 9"  (1.753 m)   Wt 223 lb 6.4 oz (101.3 kg)   LMP 10/20/2015   SpO2 99%   BMI 32.99 kg/m      01/08/2023    3:34 PM 11/07/2022   11:46 AM 10/04/2022    4:22 PM  BP/Weight  Systolic BP 138 118 180  Diastolic BP 82 73 80  Wt. (Lbs) 223.4    BMI 32.99 kg/m2        Physical Exam ***    Latest Ref Rng & Units 07/06/2022    3:56 PM 03/17/2022    3:30 PM 03/14/2022    4:53 PM  CMP  Glucose 70 - 99 mg/dL 161  72  096   BUN 6 - 20 mg/dL 9  22  24    Creatinine 0.44 - 1.00 mg/dL 0.45  4.09  8.11   Sodium 135 - 145 mmol/L 134  142  135   Potassium 3.5 - 5.1 mmol/L 3.7  3.6  3.1   Chloride 98 -  111 mmol/L 100  99  92   CO2 22 - 32 mmol/L 23  25  26    Calcium 8.9 - 10.3 mg/dL 9.0  9.4  16.1   Total Protein 6.5 - 8.1 g/dL 7.5   8.1   Total Bilirubin 0.3 - 1.2 mg/dL 0.7   0.5   Alkaline Phos 38 - 126 U/L 39   50   AST 15 - 41 U/L 20   16   ALT 0 - 44 U/L 15   11     Lipid Panel     Component Value Date/Time   CHOL 113 10/18/2021 1057   TRIG 79 10/18/2021 1057   HDL 48 10/18/2021 1057   CHOLHDL 2.4 10/18/2021 1057   CHOLHDL 4.2 07/05/2018 0330   VLDL 15 07/05/2018 0330   LDLCALC 49 10/18/2021 1057    CBC    Component Value Date/Time   WBC 3.5 (L) 07/06/2022 1556   RBC 4.37 07/06/2022 1556   HGB 11.8 (L) 07/06/2022 1556   HGB 11.0 (L) 03/17/2022 1530   HCT 35.4 (L) 07/06/2022 1556   HCT 33.3 (L) 03/17/2022 1530   PLT 252 07/06/2022 1556   PLT 352 03/17/2022 1530   MCV 81.0 07/06/2022 1556   MCV 81 03/17/2022 1530   MCH 27.0 07/06/2022 1556   MCHC 33.3 07/06/2022 1556   RDW 15.2  07/06/2022 1556   RDW 12.6 03/17/2022 1530   LYMPHSABS 0.9 07/06/2022 1556   LYMPHSABS 3.1 03/14/2022 1653   MONOABS 0.6 07/06/2022 1556   EOSABS 0.2 07/06/2022 1556   EOSABS 0.4 03/14/2022 1653   BASOSABS 0.1 07/06/2022 1556   BASOSABS 0.1 03/14/2022 1653    Lab Results  Component Value Date   HGBA1C 7.9 (A) 01/08/2023    Assessment & Plan:  1. Uncontrolled type 2 diabetes mellitus with hyperglycemia (HCC) *** - POCT glycosylated hemoglobin (Hb A1C)   Health Care Maintenance: *** Meds ordered this encounter  Medications  . Varenicline Tartrate, Starter, 0.5 MG X 11 & 1 MG X 42 TBPK    Sig: Take 0.5 mg daily for 3 days, then 0.5 mg twice daily for 4 days, then 1 mg twice daily thereafter.    Dispense:  53 each    Refill:  0    Switch to continuation pack after starter pack is completed.    Follow-up: Return in about 1 month (around 02/07/2023) for CPE/ Preventive Health Exam.       Hoy Register, MD, FAAFP. [facility] [place], [place] [phone]   01/08/2023, 4:29 PM

## 2023-01-09 ENCOUNTER — Other Ambulatory Visit: Payer: Self-pay

## 2023-01-09 ENCOUNTER — Encounter: Payer: Self-pay | Admitting: Family Medicine

## 2023-01-20 ENCOUNTER — Ambulatory Visit (HOSPITAL_COMMUNITY)
Admission: EM | Admit: 2023-01-20 | Discharge: 2023-01-20 | Disposition: A | Discharge status: Home / Self Care | Payer: 59 | Attending: Internal Medicine | Admitting: Internal Medicine

## 2023-01-20 ENCOUNTER — Encounter (HOSPITAL_COMMUNITY): Payer: Self-pay

## 2023-01-20 DIAGNOSIS — M25562 Pain in left knee: Secondary | ICD-10-CM

## 2023-01-20 DIAGNOSIS — M67911 Unspecified disorder of synovium and tendon, right shoulder: Secondary | ICD-10-CM

## 2023-01-20 DIAGNOSIS — M25511 Pain in right shoulder: Secondary | ICD-10-CM

## 2023-01-20 MED ORDER — CYCLOBENZAPRINE HCL 5 MG PO TABS: 5.0000 mg | ORAL_TABLET | Freq: Every evening | ORAL | 0 refills | Status: DC | PRN

## 2023-01-20 MED ORDER — DEXAMETHASONE SODIUM PHOSPHATE 10 MG/ML IJ SOLN
5.0000 mg | Freq: Once | INTRAMUSCULAR | Status: AC
  Administered 2023-01-20: 5 mg via INTRAMUSCULAR

## 2023-01-20 MED ORDER — DEXAMETHASONE SODIUM PHOSPHATE 10 MG/ML IJ SOLN
INTRAMUSCULAR | Status: AC
  Filled 2023-01-20: qty 1

## 2023-01-20 MED ORDER — PREDNISONE 20 MG PO TABS: 20.0000 mg | ORAL_TABLET | Freq: Every day | ORAL | 0 refills | Status: AC

## 2023-01-20 NOTE — ED Triage Notes (Signed)
Left knee and right shoulder pain onset 1 week ago. 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 H/o of torn rotary cuff in the left shoulder.

## 2023-01-20 NOTE — Discharge Instructions (Addendum)
Symptoms are due to overuse tendinopathy and muscular strain.  I gave you a steroid shot in the clinic. Start taking prednisone 20 mg once daily in the morning for the next 5 mornings starting tomorrow.  Take flexeril 5mg  at bedtime as needed for muscle spasm.  Only take at bedtime as this can make you drowsy.  Follow-up with orthopedic provider listed on your paperwork.  If you develop any new or worsening symptoms or do not improve in the next 2 to 3 days, please return.  If your symptoms are severe, please go to the emergency room.  Follow-up with your primary care provider for further evaluation and management of your symptoms as well as ongoing wellness visits.  I hope you feel better!

## 2023-01-20 NOTE — ED Provider Notes (Signed)
[facility]    CSN: [id number] Arrival date & time: 01/20/23  1647      History   Chief Complaint Chief Complaint  Patient presents with   Knee Pain    left   Shoulder Pain    right    HPI AEMILIA Chaney is a 52 y.o. female.   Patient presents to urgent care for evaluation of left knee and right shoulder pain that started 1 week ago gradually. Pain worsened significantly today causing her to come to urgent care for evaluation. Right shoulder pain is worse with movement and when moving the right arm across the body. No popping or clicking to the shoulder or the knee. Left knee hurts significantly to light touch. No redness or swelling to either shoulder or knee. No recent falls/trauma/injuries to either site. Pain does not radiate down the right arm or the left leg, just stays in each respective joint. 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 No recent steroid use. She has been using lidocaine patches to help with pain with some relief. She has not tried taking any tylenol or other OTC medicines to help with pain.    Knee Pain Shoulder Pain   Past Medical History:  Diagnosis Date   Coronary artery disease involving native coronary artery of native heart with angina pectoris (HCC) 01/2013   a. s/p PCI of the mid RCA 01/2013 //  b. LHC 9/14: EF 55%, RCA stent 100% occluded, LAD irregularities >>  subacute stent thrombosis, Promus DES PCI distal overlap // c) 09/2017 - Ant STEMI - LAD-D2 PCI - Successful PCI of LAD (3 overlapping DES), unable to restore flow down D2l that was stented as well.  Likely related to downstream dissection, but unable to rewire.   Daily headache    Depression    Dyslipidemia, goal LDL below 70    History of Doppler ultrasound    carotid bruit >> a. Carotid US 6/17: bilat ICA 1-39%   History of Takotsubo cardiomyopathy 11/2018   Admitted for acute on chronic combined systolic and  diastolic heart failure.  EF down to 20-25% global hypokinesis.  Was in the setting of grieving over the loss of her son from MI.  ->  EF improved to 45-50% on follow-up echocardiogram.   Hypertension    Ischemic cardiomyopathy 06/2018   s/p inferior STEMI followed by 2 anterior STEMIs: Following recovery from Takotsubo Cardiomyopathy- Echo 02/14/19: Moderate LVH.  EF improved up to 45-50%.  GR 1 DD.  Severe apical akinesis.  Normal valves.   Mild dysplasia of cervix (CIN I) 02/04/2019   Seen after colposcopy on 01/23/19 done for ASCU +HPV pap  > repeat pap and HPV test in 12 and 24 months   STEMI involving left anterior descending coronary artery (HCC) 09/2017; 06/2018   a) Severe Medina 1, 1, 1 LAD-D2 lesion (complicated by post PTCA dissection/intramural hematoma) -successful extensive PCI of the LAD but unable to maintain patency of the stented D2. b) distal mLAD stent Edge 100% thrombosis --> PTCA & Overlapping DES PCI (Synergy 3 x 12 --> 3.6-3.3 mm). D2 occluded. RCA stents patent, 65 % OM2. EF 30-35%.   STEMI involving oth coronary artery of inferior wall (HCC) 03/2013   Secondary to subacute stent thrombosis of RCA stent segment having missed 4 doses of Effient.   Tobacco abuse 01/11/2016   Type II diabetes mellitus (HCC) 12/2010   sister and  son also diabetic     Patient Active Problem List   Diagnosis Date Noted   Cardiomyopathy, ischemic 02/14/2021   Acute pyelonephritis 07/21/2020   Pyelonephritis 07/20/2020   Trichomoniasis 03/30/2019   Mild dysplasia of cervix (CIN I) 02/04/2019   Screening breast examination 01/16/2019   Pulmonary edema 12/18/2018   Atypical squamous cell changes of undetermined significance (ASCUS) on cervical cytology with positive high risk human papilloma virus (HPV) 11/21/2018   Dyslipidemia, goal LDL below 70    Acute ST elevation myocardial infarction (STEMI) involving left anterior descending (LAD) coronary artery (HCC) 07/03/2018   Dark stools 04/03/2018    Hypertensive emergency 12/30/2017   STEMI involving left anterior descending coronary artery (HCC) 10/10/2017   MI, acute, non ST segment elevation (HCC)    Carotid artery disease (HCC) 01/11/2016   Prolonged Q-T interval on ECG 01/11/2016   Tobacco abuse counseling 01/11/2016   Heart palpitations 01/11/2016   Tobacco abuse 01/11/2016   Morbid obesity (HCC): BMI ~36 with HTN, HLD, CAD 11/25/2015   Smoker 03/03/2015   Coronary artery disease involving native coronary artery of native heart with angina pectoris (HCC) 12/09/2014   Chronic combined systolic (congestive) and diastolic (congestive) heart failure (HCC) 12/09/2014   H/O noncompliance with medical treatment, presenting hazards to health 12/09/2014   S/P primary angioplasty with coronary stent 11/18/2014   Diabetes type 2, uncontrolled 04/01/2013   Hyperlipidemia associated with type 2 diabetes mellitus (HCC) 04/01/2013   Depression 04/01/2013   Stress at home 04/01/2013   Hypertensive heart disease with chronic combined systolic and diastolic congestive heart failure (HCC) 09/05/2011   Type II diabetes mellitus (HCC) 12/30/2010    Past Surgical History:  Procedure Laterality Date   CHOLECYSTECTOMY  ~ 2000   CORONARY STENT INTERVENTION N/A 10/10/2017   Procedure: CORONARY STENT INTERVENTION;  Surgeon: Marykay Lex, MD;  [facility];  Service: Cardiovascular; LAD PCI: STENT SYNERGY DES 3.5X32 (p),STENT SYNERGY DES 3.5X 8 (m), STENT SYNERGY DES 3.5X28 (d).  D2 PCI: STENT SYNERGY DES 2.5X24.  (UNABL TO REWIRE & EXPAND - TO OF DIAG AT END OF CASE)   CORONARY STENT INTERVENTION N/A 07/03/2018   Procedure: CORONARY STENT INTERVENTION;  Surgeon: Marykay Lex, MD;  [facility];; PTCA & overlapping DES PCI (overlaps prior stents distally) - Synergy DES 3 x 12 (3.6 mm @ overlap -- 3.3 mm distally)>   LEFT HEART CATH AND CORONARY ANGIOGRAPHY N/A 10/10/2017   Procedure: LEFT HEART CATH AND CORONARY  ANGIOGRAPHY;  Surgeon: Marykay Lex, MD;  Location: [facility] INVASIVE CV LAB;  Service: Cardiovascular;  Laterality: N/A; -patent RCA stents with mild in-stent restenosis. Medina 1,1,1 mLD-D2 90% (complicated by dissection/intramural thrombus)   LEFT HEART CATH AND CORONARY ANGIOGRAPHY N/A 07/03/2018   Procedure: LEFT HEART CATH AND CORONARY ANGIOGRAPHY;  Surgeon: Marykay Lex, MD;  Location: [facility] INVASIVE CV LAB;; 100% thrombotic occlusion distal LAD stent -- DES PCI. continued 100% D2 (previously lost). patent RCA stents, 65% OM2.  EF 30-35%.   LEFT HEART CATHETERIZATION WITH CORONARY ANGIOGRAM N/A 02/25/2013   Procedure: LEFT HEART CATHETERIZATION WITH CORONARY ANGIOGRAM;  Surgeon: Pamella Pert, MD;  Location: [facility] CATH LAB;  Service: Cardiovascular;; CTO m RCA; otherwise normal coronaries   LEFT HEART CATHETERIZATION WITH CORONARY ANGIOGRAM N/A 04/01/2013   Procedure: LEFT HEART CATHETERIZATION WITH CORONARY ANGIOGRAM;  Surgeon: Pamella Pert, MD;  Location: [facility] CATH LAB;  Service: Cardiovascular: 100% occlusion of distal RCA stent (subacute stent thrombosis -  secondary to stopping Effient).  Overlapping DES PCI   NM MYOVIEW LTD  12/2015    EF 38%.  Hypertensive response to exercise (231/132 mmHg).  INTERMEDIATE RISK due to -diffuse hypokinesis and reduced EF.  No ischemia or infarction.   PERCUTANEOUS CORONARY STENT INTERVENTION (PCI-S)  04/01/2013   Procedure: PERCUTANEOUS CORONARY STENT INTERVENTION (PCI-S);  Surgeon: Pamella Pert, MD;  Location: [facility] CATH LAB;  Service: Cardiovascular;; PCI of distal RCA stent subacute thrombosis: Promus Premier DES 3.0 mm x 20 mm.   PERCUTANEOUS CORONARY STENT INTERVENTION (PCI-S)  02/25/2013   Procedure: PERCUTANEOUS CORONARY STENT INTERVENTION (PCI-S);  Surgeon: Pamella Pert, MD;  Location: [facility] CATH LAB;  RCA CTO PCI with overlapping Promus DES: 3.0 mm x 38 mm, 3.5 mm x 16mm   RIGHT/LEFT HEART CATH AND CORONARY ANGIOGRAPHY N/A 11/04/2019   Procedure:  RIGHT/LEFT HEART CATH AND CORONARY ANGIOGRAPHY;  Surgeon: Marykay Lex, MD;  Location: [facility] INVASIVE CV LAB;; Mildly elevated LVEDP.  Ostial D1 100%.  Proximal to mid LAD stent 10% ISR.  Mid and distal stent widely patent.  30% distal distance.  Ostial OM 2 55%.  Proximal to mid RCA stent 50% stenosis.  Mid to distal RCA 40%.  Suspect that reduced EF is related to hypertensive emergen   TRANSTHORACIC ECHOCARDIOGRAM  07/04/2018   recurrent Anterior STEMI: EF 35-40%.  Apical hypokinesis.  GRII DD.  No thrombus noted.  (Improved EF from 30% up to 35-40% from previous echo)   TRANSTHORACIC ECHOCARDIOGRAM  11/2018; 01/2019   a) TAKOTUSBO CM: EF 20-25%-diffuse HK..  Mod V thickness.  GRII DD w/ high LVEDP.  Severe LA dilation;; b) 02/14/19: Moderate LVH.  EF improved up to 45-50%.  GR 1 DD.  Severe apical akinesis.  Normal valves.   TRANSTHORACIC ECHOCARDIOGRAM  10/31/2019   Hypertensive emergency, hypoxic/hypercapnic respiratory failure;  EF 20 to 25%.  Global HK.  Mildly dilated LV.  Mildly elevated PA pressures.  Mild LA dilation.  Mild AR.  Mildly elevated RAP-8 mmHg.=> Eventual cath showed no change   TUBAL LIGATION  1994    OB History     Gravida  3   Para  3   Term  3   Preterm  0   AB  0   Living  3      SAB  0   IAB  0   Ectopic  0   Multiple  0   Live Births               Home Medications    Prior to Admission medications   Medication Sig Start Date End Date Taking? Authorizing Provider  carvedilol (COREG) 25 MG tablet Take 1 tablet (25 mg total) by mouth 2 (two) times daily. 10/04/22 04/02/23 Yes McClung, Marzella Schlein, PA-C  clopidogrel (PLAVIX) 75 MG tablet Take 1 tablet (75 mg total) by mouth daily. 01/04/23  Yes McClung, Marzella Schlein, PA-C  Continuous Glucose Receiver (DEXCOM G7 RECEIVER) DEVI Use to check blood sugar throughout the day. E11.65 12/14/22  Yes Hoy Register, MD  Continuous Glucose Sensor (DEXCOM G7 SENSOR) MISC Use to check blood sugar throughout the day.  Change sensors once every 10 days. E11.65 12/14/22  Yes Hoy Register, MD  cyclobenzaprine (FLEXERIL) 5 MG tablet Take 1 tablet (5 mg total) by mouth at bedtime as needed for muscle spasms. 01/20/23  Yes Carlisle Beers, FNP  Dulaglutide (TRULICITY) 0.75 MG/0.5ML SOPN Inject 0.75 mg into the skin once a week. Use for  4 weeks, then increase to 1.5 mg weekly dose. 12/28/22  Yes Newlin, Odette Horns, MD  Dulaglutide (TRULICITY) 1.5 MG/0.5ML SOPN Inject 1.5 mg into the skin once a week. Start once you finish 4 weeks of the 0.75mg  pen. 12/28/22  Yes Hoy Register, MD  hydrALAZINE (APRESOLINE) 10 MG tablet Take 1 tablet (10 mg total) by mouth in the morning and at bedtime. 10/04/22  Yes McClung, Angela M, PA-C  insulin glargine (LANTUS SOLOSTAR) 100 UNIT/ML Solostar Pen Inject 20 Units into the skin daily. 11/07/22  Yes Hoy Register, MD  Multiple Vitamins-Minerals (WOMENS 50+ MULTI VITAMIN PO) Take 1 tablet by mouth daily.   Yes [provider]  naproxen sodium (ALEVE) 220 MG tablet Take 220 mg by mouth daily as needed (pain).   Yes [provider]  predniSONE (DELTASONE) 20 MG tablet Take 1 tablet (20 mg total) by mouth daily for 5 days. 01/20/23 01/25/23 Yes Carlisle Beers, FNP  rosuvastatin (CRESTOR) 40 MG tablet Take 1 tablet (40 mg total) by mouth daily. 11/07/22  Yes Newlin, Odette Horns, MD  sacubitril-valsartan (ENTRESTO) 97-103 MG Take 1 tablet by mouth 2 (two) times daily. 09/06/22  Yes Marykay Lex, MD  spironolactone (ALDACTONE) 25 MG tablet Take 0.5 tablets (12.5 mg total) by mouth daily. 10/04/22 10/04/23 Yes McClung, Marzella Schlein, PA-C  Varenicline Tartrate, Starter, 0.5 MG X 11 & 1 MG X 42 TBPK Take 0.5 mg daily for 3 days, then 0.5 mg twice daily for 4 days, then 1 mg twice daily thereafter. 01/08/23  Yes Newlin, Odette Horns, MD  Evolocumab (REPATHA SURECLICK) 140 MG/ML SOAJ Inject 140 mg into the skin every 14 (fourteen) days. Inject one pen into the subcutaneous tissue every 14  days Patient not taking: Reported on 01/08/2023    [provider]  furosemide (LASIX) 20 MG tablet Take 1 tablet (20 mg total) by mouth as needed. Patient not taking: Reported on 01/08/2023 03/17/22 03/17/23  Azalee Course, PA  gabapentin (NEURONTIN) 300 MG capsule Take 1 capsule (300 mg total) by mouth 3 (three) times daily. Patient not taking: Reported on 09/26/2022 11/16/21 06/14/22  Hoy Register, MD  Insulin Pen Needle (TECHLITE PEN NEEDLES) 32G X 4 MM MISC Use 1 pen needle with insulin once daily 11/07/22   Hoy Register, MD  nitroGLYCERIN (NITROSTAT) 0.4 MG SL tablet Place 1 tablet (0.4 mg total) under the tongue every 5 (five) minutes as needed for chest pain. Reported on 11/25/2015 07/06/18   Manson Passey, PA  TRUEPLUS LANCETS 28G MISC 28 g by Does not apply route 4 (four) times daily. 07/06/18   Rai, Ripudeep Kirtland Bouchard, MD  buPROPion (WELLBUTRIN SR) 150 MG 12 hr tablet Take 1 tablet (150 mg total) by mouth 2 (two) times daily. 11/17/19 07/20/20  Hoy Register, MD    Family History Family History  Problem Relation Age of Onset   Diabetes Mother    Diabetes Sister    Diabetes Sister    Diabetes Son        type 1    Social History Social History   Tobacco Use   Smoking status: Every Day    Packs/day: 0.25    Years: 22.00    Additional pack years: 0.00    Total pack years: 5.50    Types: Cigarettes   Smokeless tobacco: Never   Tobacco comments:    current smoker 4-5 cigarettes per day; previously smoked at least a pack a day.  Vaping Use   Vaping Use: Never used  Substance Use  Topics   Alcohol use: Yes    Alcohol/week: 0.0 standard drinks of alcohol    Comment: 07/04/2018 "only on special occasions; maybe once/yr"   Drug use: Yes    Types: Marijuana    Comment: 02/25/2013 "quit marijuana ~ 2001"     Allergies   Farxiga [dapagliflozin]   Review of Systems Review of Systems Per HPI  Physical Exam Triage Vital Signs ED Triage Vitals  Enc Vitals Group     BP  01/20/23 1716 116/77     Pulse Rate 01/20/23 1716 69     Resp 01/20/23 1716 18     Temp 01/20/23 1716 98 F (36.7 C)     Temp Source 01/20/23 1716 Oral     SpO2 01/20/23 1716 98 %     Weight 01/20/23 1716 228 lb (103.4 kg)     Height 01/20/23 1716 5\' 9"  (1.753 m)     Head Circumference --      Peak Flow --      Pain Score 01/20/23 1714 5     Pain Loc --      Pain Edu? --      Excl. in GC? --    No data found.  Updated Vital Signs BP 116/77 (BP Location: Right Arm)   Pulse 69   Temp 98 F (36.7 C) (Oral)   Resp 18   Ht 5\' 9"  (1.753 m)   Wt 228 lb (103.4 kg)   LMP 10/20/2015   SpO2 98%   BMI 33.67 kg/m   Visual Acuity Right Eye Distance:   Left Eye Distance:   Bilateral Distance:    Right Eye Near:   Left Eye Near:    Bilateral Near:     Physical Exam Vitals and nursing note reviewed.  Constitutional:      Appearance: She is not ill-appearing or toxic-appearing.  HENT:     Head: Normocephalic and atraumatic.     Right Ear: Hearing and external ear normal.     Left Ear: Hearing and external ear normal.     Nose: Nose normal.     Mouth/Throat:     Lips: Pink.  Eyes:     General: Lids are normal. Vision grossly intact. Gaze aligned appropriately.     Extraocular Movements: Extraocular movements intact.     Conjunctiva/sclera: Conjunctivae normal.  Cardiovascular:     Rate and Rhythm: Normal rate and regular rhythm.     Heart sounds: Normal heart sounds, S1 normal and S2 normal.  Pulmonary:     Effort: Pulmonary effort is normal. No respiratory distress.     Breath sounds: Normal breath sounds and air entry.  Musculoskeletal:     Right shoulder: Tenderness (TTP to the generalized anterior shoulder joint) present. No swelling, deformity, effusion, laceration, bony tenderness or crepitus. Normal range of motion (normal active ROM despite tenderness). Normal strength (5/5 grip strength to bilateral upper extremities). Normal pulse (+2 bilateral radial pulses).      Cervical back: Normal and neck supple.     Thoracic back: Normal.     Lumbar back: Normal.     Right knee: Normal.     Left knee: No swelling, deformity, effusion, erythema, ecchymosis, lacerations, bony tenderness or crepitus. Normal range of motion. Tenderness present over the medial joint line, lateral joint line and patellar tendon. Normal alignment, normal meniscus and normal patellar mobility.  Skin:    General: Skin is warm and dry.     Capillary Refill: Capillary refill takes less  than 2 seconds.     Findings: No rash.  Neurological:     General: No focal deficit present.     Mental Status: She is alert and oriented to person, place, and time. Mental status is at baseline.     Cranial Nerves: No dysarthria or facial asymmetry.  Psychiatric:        Mood and Affect: Mood normal.        Speech: Speech normal.        Behavior: Behavior normal.        Thought Content: Thought content normal.        Judgment: Judgment normal.      UC Treatments / Results  Labs (all labs ordered are listed, but only abnormal results are displayed) Labs Reviewed - No data to display  EKG   Radiology No results found.  Procedures Procedures (including critical care time)  Medications Ordered in UC Medications  dexamethasone (DECADRON) injection 5 mg (5 mg Intramuscular Given 01/20/23 1800)    Initial Impression / Assessment and Plan / UC Course  I have reviewed the triage vital signs and the nursing notes.  Pertinent labs & imaging results that were available during my care of the patient were reviewed by me and considered in my medical decision making (see chart for details).  Acute pain of right shoulder, acute pain of left knee Evaluation suggests symptoms are due to overuse tendinopathy. Deferred imaging based on stable musculoskeletal exam finding and atraumatic mechanism of injury. Symptoms have not responded well to over the counter treatments, therefore will initiate steroid  therapy today to treat acute pain and inflammation. Dexamethasone 10mg  IM given in clinic. Prednisone burst starting tomorrow 20mg  every day for 5 days with breakfast. Advised this may increase sugars temporarily. Flexeril as needed for muscle spasm, drowsiness precautions discussed. Walking referral to orthopedics provided. Gentle ROM exercises and heat recommended.   Discussed red flag signs and symptoms of worsening condition,when to call the PCP office, return to urgent care, and when to seek higher level of care in the emergency department. Counseled patient regarding appropriate use of medications and potential side effects for all medications recommended or prescribed today. Patient verbalizes understanding and agreement with plan. Discharged in stable condition.     Final Clinical Impressions(s) / UC Diagnoses   Final diagnoses:  Acute pain of right shoulder  Acute pain of left knee     Discharge Instructions      Symptoms are due to overuse tendinopathy and muscular strain.  I gave you a steroid shot in the clinic. Start taking prednisone 20 mg once daily in the morning for the next 5 mornings starting tomorrow.  Take flexeril 5mg  at bedtime as needed for muscle spasm.  Only take at bedtime as this can make you drowsy.  Follow-up with orthopedic provider listed on your paperwork.  If you develop any new or worsening symptoms or do not improve in the next 2 to 3 days, please return.  If your symptoms are severe, please go to the emergency room.  Follow-up with your primary care provider for further evaluation and management of your symptoms as well as ongoing wellness visits.  I hope you feel better!      ED Prescriptions     Medication Sig Dispense Auth. Provider   cyclobenzaprine (FLEXERIL) 5 MG tablet Take 1 tablet (5 mg total) by mouth at bedtime as needed for muscle spasms. 10 tablet Carlisle Beers, FNP   predniSONE (DELTASONE) 20 MG  tablet Take 1 tablet (20  mg total) by mouth daily for 5 days. 5 tablet Carlisle Beers, FNP      PDMP not reviewed this encounter.   Carlisle Beers, [place] 01/21/23 2125

## 2023-01-23 ENCOUNTER — Other Ambulatory Visit: Payer: Self-pay

## 2023-01-25 DIAGNOSIS — M25562 Pain in left knee: Secondary | ICD-10-CM | POA: Diagnosis not present

## 2023-01-25 DIAGNOSIS — M25511 Pain in right shoulder: Secondary | ICD-10-CM | POA: Diagnosis not present

## 2023-01-26 ENCOUNTER — Other Ambulatory Visit: Payer: Self-pay

## 2023-01-29 ENCOUNTER — Other Ambulatory Visit: Payer: Self-pay

## 2023-01-30 ENCOUNTER — Other Ambulatory Visit: Payer: Self-pay

## 2023-01-30 ENCOUNTER — Encounter: Payer: Self-pay | Admitting: Pharmacist

## 2023-01-30 ENCOUNTER — Ambulatory Visit: Payer: 59 | Attending: Family Medicine | Admitting: Pharmacist

## 2023-01-30 VITALS — BP 137/76

## 2023-01-30 DIAGNOSIS — Z794 Long term (current) use of insulin: Secondary | ICD-10-CM

## 2023-01-30 DIAGNOSIS — Z7985 Long-term (current) use of injectable non-insulin antidiabetic drugs: Secondary | ICD-10-CM | POA: Diagnosis not present

## 2023-01-30 DIAGNOSIS — I10 Essential (primary) hypertension: Secondary | ICD-10-CM

## 2023-01-30 DIAGNOSIS — E1169 Type 2 diabetes mellitus with other specified complication: Secondary | ICD-10-CM

## 2023-01-30 MED ORDER — LANTUS SOLOSTAR 100 UNIT/ML ~~LOC~~ SOPN
26.0000 [IU] | PEN_INJECTOR | Freq: Every day | SUBCUTANEOUS | 2 refills | Status: DC
  Filled 2023-01-30: qty 15, 57d supply, fill #0
  Filled 2023-03-29 (×2): qty 6, 23d supply, fill #0

## 2023-01-30 NOTE — Progress Notes (Signed)
S:     No chief complaint on file.  52 y.o. female who presents for diabetes evaluation, education, and management.  PMH is significant for history of type 2 diabetes mellitus (A1c 7.9%), hypertension, CHF (EF 50 to 55% from echo of 02/2021), CAD (status post PCI to LAD with 3 overlapping DES in 09/2017), STEMI in 06/2018.   Patient was referred and last seen by Dr. Alvis Lemmings on 01/08/2023. I have been working with her over the spring and her A1c is down from 11.6 to 7.9%. Since her last visit with me, she has started rec IA corticosteroid injections. I'm not sure the facility from which she's receiving these but her CGM data correlates. Summary given below.  Today, she reports no NV. Has been switched to Trulicity and just picked-up the 1.5 mg dose on 01/26/2023. Denies any changes in her vision. Regarding her insulin, she reports adherence and is taking 20u daily. Unfortunately, her steroid injections are increasing her BG levels to the 200-300 range for ~3-4 days S/p injection. She'll compensate for this by taking 20u of Lantus BID.  Family/Social History:  -Fhx: DM -Tobacco: current 0.25 PPD smoker   -Alcohol: none reported   Current diabetes medications include: Lantus 20u daily, Trulicity 1.5 mg weekly (just picked-up but has not started yet) Current hypertension medications include: carvedilol 25 mg BID, Entresto 97-103 mg BID, hydralazine 10 mg BID, spironolactone 12.5 mg daily  Current hyperlipidemia medications include: Repatha (not taking), rosuvastatin 40 mg daily   Patient reports adherence to taking all medications as prescribed.  Insurance coverage: Managed Medicaid  Patient denies hypoglycemia.   Patient denies nocturia (nighttime urination).  Patient reports neuropathy (nerve pain). Patient denies visual changes. Patient reports self foot exams.   Patient reported dietary habits:  -Admits to dietary indiscretion -Eats fast food regularly due to stress and business of  her schedule.   Patient-reported exercise habits:  -None   O:  Information from GM:  Current CBG level: 183 mg/dL 2 week avg (during which she received IA steroid): 249 mg/dL  Lab Results  Component Value Date   HGBA1C 7.9 (A) 01/08/2023   There were no vitals filed for this visit.  Lipid Panel     Component Value Date/Time   CHOL 113 10/18/2021 1057   TRIG 79 10/18/2021 1057   HDL 48 10/18/2021 1057   CHOLHDL 2.4 10/18/2021 1057   CHOLHDL 4.2 07/05/2018 0330   VLDL 15 07/05/2018 0330   LDLCALC 49 10/18/2021 1057    Clinical Atherosclerotic Cardiovascular Disease (ASCVD): No  The ASCVD Risk score (Arnett DK, et al., 2019) failed to calculate for the following reasons:   The patient has a prior MI or stroke diagnosis   Patient is participating in a Managed Medicaid Plan:  Yes   A/P: Diabetes longstanding currently above goal but improved based on A1c. Unfortunately,  CGM reveals hyperglycemia since starting steroid injections. Will have her increase Trulicity and titrate insulin. She does not current have any hypoglycemia recorded and is able to verbalize appropriate hypoglycemia management plan. -Increase Lantus 26u once daily.   -Increase Trulicity 1.5 mg weekly.  -She cannot tolerate metformin.  -Patient educated on purpose, proper use, and potential adverse effects of Lantus, Trulicity. -Extensively discussed pathophysiology of diabetes, recommended lifestyle interventions, dietary effects on blood sugar control.  -Counseled on s/sx of and management of hypoglycemia.  -Next A1c anticipated 9/24.   HTN: patient with a hx of HTN and known clinical ASCVD. BP goal is <  130/80 mmHg. BP today in clinic is close to goal. She is adherent to her medications.  -No changes in BP regimen today.  Written patient instructions provided. Patient verbalized understanding of treatment plan.  Total time in face to face counseling 30 minutes.    Follow-up:  PCP: 02/21/2023. ME:  03/02/2023.   Butch Penny, PharmD, Patsy [place], CPP Clinical Pharmacist [facility] [phone]

## 2023-02-06 ENCOUNTER — Ambulatory Visit: Payer: 59 | Admitting: Pharmacist

## 2023-02-06 DIAGNOSIS — E785 Hyperlipidemia, unspecified: Secondary | ICD-10-CM | POA: Diagnosis not present

## 2023-02-06 DIAGNOSIS — Z008 Encounter for other general examination: Secondary | ICD-10-CM | POA: Diagnosis not present

## 2023-02-06 DIAGNOSIS — Z8249 Family history of ischemic heart disease and other diseases of the circulatory system: Secondary | ICD-10-CM | POA: Diagnosis not present

## 2023-02-06 DIAGNOSIS — E1151 Type 2 diabetes mellitus with diabetic peripheral angiopathy without gangrene: Secondary | ICD-10-CM | POA: Diagnosis not present

## 2023-02-06 DIAGNOSIS — Z794 Long term (current) use of insulin: Secondary | ICD-10-CM | POA: Diagnosis not present

## 2023-02-06 DIAGNOSIS — I110 Hypertensive heart disease with heart failure: Secondary | ICD-10-CM | POA: Diagnosis not present

## 2023-02-06 DIAGNOSIS — Z833 Family history of diabetes mellitus: Secondary | ICD-10-CM | POA: Diagnosis not present

## 2023-02-06 DIAGNOSIS — E669 Obesity, unspecified: Secondary | ICD-10-CM | POA: Diagnosis not present

## 2023-02-06 DIAGNOSIS — Z818 Family history of other mental and behavioral disorders: Secondary | ICD-10-CM | POA: Diagnosis not present

## 2023-02-06 DIAGNOSIS — I509 Heart failure, unspecified: Secondary | ICD-10-CM | POA: Diagnosis not present

## 2023-02-06 DIAGNOSIS — F17210 Nicotine dependence, cigarettes, uncomplicated: Secondary | ICD-10-CM | POA: Diagnosis not present

## 2023-02-06 DIAGNOSIS — I252 Old myocardial infarction: Secondary | ICD-10-CM | POA: Diagnosis not present

## 2023-02-06 DIAGNOSIS — E11319 Type 2 diabetes mellitus with unspecified diabetic retinopathy without macular edema: Secondary | ICD-10-CM | POA: Diagnosis not present

## 2023-02-21 ENCOUNTER — Encounter: Payer: Self-pay | Admitting: Family Medicine

## 2023-02-21 ENCOUNTER — Other Ambulatory Visit (HOSPITAL_COMMUNITY)
Admission: RE | Admit: 2023-02-21 | Discharge: 2023-02-21 | Disposition: A | Discharge status: Home / Self Care | Payer: 59 | Source: Ambulatory Visit | Attending: Family Medicine | Admitting: Family Medicine

## 2023-02-21 ENCOUNTER — Ambulatory Visit: Payer: 59 | Attending: Family Medicine | Admitting: Family Medicine

## 2023-02-21 VITALS — BP 153/83 | HR 74 | Temp 98.4°F | Ht 69.0 in | Wt 222.0 lb

## 2023-02-21 DIAGNOSIS — I5042 Chronic combined systolic (congestive) and diastolic (congestive) heart failure: Secondary | ICD-10-CM | POA: Diagnosis not present

## 2023-02-21 DIAGNOSIS — Z124 Encounter for screening for malignant neoplasm of cervix: Secondary | ICD-10-CM | POA: Diagnosis not present

## 2023-02-21 DIAGNOSIS — I110 Hypertensive heart disease with heart failure: Secondary | ICD-10-CM

## 2023-02-21 DIAGNOSIS — Z113 Encounter for screening for infections with a predominantly sexual mode of transmission: Secondary | ICD-10-CM

## 2023-02-21 DIAGNOSIS — Z1211 Encounter for screening for malignant neoplasm of colon: Secondary | ICD-10-CM | POA: Diagnosis not present

## 2023-02-21 DIAGNOSIS — Z0001 Encounter for general adult medical examination with abnormal findings: Secondary | ICD-10-CM

## 2023-02-21 NOTE — Progress Notes (Signed)
Subjective:  Patient ID: Amy Chaney, female    DOB: Dec 12, 1970  Age: 52 y.o. MRN: [id number]  CC: Annual Exam (Physical. /No questions/ concerns/Yes to shingles vax. Yes to pap for another appt. )   HPI Amy Chaney is a 52 y.o. year old female with a history of Type 2 diabetes mellitus (A1c 7.9), hypertension, CHF (EF 50 to 55% from echo of 02/2021), CAD (status post PCI to LAD with 3 overlapping DES in 09/2017), STEMI in 06/2018.   Interval History:   Presents today for a complete physical exam. Due for Pap smear and colon cancer screening. She has a mammogram scheduled in 04/2023 to follow-up on breast calcifications.   Eye appointment was in 11/2022. She  does not see a Dentist regularly. Does not exercise much since her knees started bothering her.     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 s/p PCI of the mid RCA 01/2013 //  b. LHC 9/14: EF 55%, RCA stent 100% occluded, LAD irregularities >>  subacute stent thrombosis, Promus DES PCI distal overlap // c) 09/2017 - Ant STEMI - LAD-D2 PCI - Successful PCI of LAD (3 overlapping DES), unable to restore flow down D2l that was stented as well.  Likely related to downstream dissection, but unable to rewire.   Daily headache    Depression    Dyslipidemia, goal LDL below 70    History of Doppler ultrasound    carotid bruit >> a. 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  EF down to 20-25% global hypokinesis.  Was in the setting of grieving over the loss of her son from MI.  ->  EF improved to 45-50% on follow-up echocardiogram.   Hypertension    Ischemic cardiomyopathy 06/2018   s/p inferior STEMI followed by 2 anterior STEMIs: Following recovery from Takotsubo Cardiomyopathy- Echo 02/14/19: Moderate LVH.  EF  improved up to 45-50%.  GR 1 DD.  Severe apical akinesis.  Normal valves.   Mild dysplasia of cervix (CIN I) 02/04/2019   Seen after colposcopy on 01/23/19 done for ASCU +HPV pap  > repeat pap and HPV test in 12 and 24 months   STEMI involving left anterior descending coronary artery (HCC) 09/2017; 06/2018   a) Severe [place] 1, 1, 1 LAD-D2 lesion (complicated by post PTCA dissection/intramural hematoma) -successful extensive PCI of the LAD but unable to maintain patency of the stented D2. b) distal mLAD stent Edge 100% thrombosis --> PTCA & Overlapping DES PCI (Synergy 3 x 12 --> 3.6-3.3 mm). D2 occluded. RCA stents patent, 65 % OM2. EF 30-35%.   STEMI involving oth coronary artery of inferior wall (HCC) 03/2013   Secondary to subacute stent thrombosis of RCA stent segment having missed 4 doses of Effient.   Tobacco abuse 01/11/2016   Type II diabetes mellitus (HCC) 12/2010   sister and son also diabetic     Past Surgical History:  Procedure Laterality Date   CHOLECYSTECTOMY  ~ 2000   CORONARY STENT INTERVENTION N/A 10/10/2017   Procedure: CORONARY STENT INTERVENTION;  Surgeon: Marykay Lex, MD;  Location: [facility] INVASIVE CV LAB;  Service: Cardiovascular; LAD PCI: STENT SYNERGY DES 3.5X32 (p),STENT SYNERGY DES 3.5X 8 (m), STENT SYNERGY DES 3.5X28 (d).  D2 PCI: STENT SYNERGY DES 2.5X24.  (  UNABL TO REWIRE & EXPAND - TO OF DIAG AT END OF CASE)   CORONARY STENT INTERVENTION N/A 07/03/2018   Procedure: CORONARY STENT INTERVENTION;  Surgeon: Marykay Lex, MD;  Location: [facility] INVASIVE CV LAB;; PTCA & overlapping DES PCI (overlaps prior stents distally) - Synergy DES 3 x 12 (3.6 mm @ overlap -- 3.3 mm distally)>   LEFT HEART CATH AND CORONARY ANGIOGRAPHY N/A 10/10/2017   Procedure: LEFT HEART CATH AND CORONARY ANGIOGRAPHY;  Surgeon: Marykay Lex, MD;  Location: [facility] INVASIVE CV LAB;  Service: Cardiovascular;  Laterality: N/A; -patent RCA stents with mild in-stent restenosis. [place] 1,1,1 mLD-D2 90%  (complicated by dissection/intramural thrombus)   LEFT HEART CATH AND CORONARY ANGIOGRAPHY N/A 07/03/2018   Procedure: LEFT HEART CATH AND CORONARY ANGIOGRAPHY;  Surgeon: Marykay Lex, MD;  Location: [facility] INVASIVE CV LAB;; 100% thrombotic occlusion distal LAD stent -- DES PCI. continued 100% D2 (previously lost). patent RCA stents, 65% OM2.  EF 30-35%.   LEFT HEART CATHETERIZATION WITH CORONARY ANGIOGRAM N/A 02/25/2013   Procedure: LEFT HEART CATHETERIZATION WITH CORONARY ANGIOGRAM;  Surgeon: Pamella Pert, MD;  Location: [facility] CATH LAB;  Service: Cardiovascular;; CTO m RCA; otherwise normal coronaries   LEFT HEART CATHETERIZATION WITH CORONARY ANGIOGRAM N/A 04/01/2013   Procedure: LEFT HEART CATHETERIZATION WITH CORONARY ANGIOGRAM;  Surgeon: Pamella Pert, MD;  Location: [facility] CATH LAB;  Service: Cardiovascular: 100% occlusion of distal RCA stent (subacute stent thrombosis -  secondary to stopping Effient).  Overlapping DES PCI   NM MYOVIEW LTD  12/2015    EF 38%.  Hypertensive response to exercise (231/132 mmHg).  INTERMEDIATE RISK due to -diffuse hypokinesis and reduced EF.  No ischemia or infarction.   PERCUTANEOUS CORONARY STENT INTERVENTION (PCI-S)  04/01/2013   Procedure: PERCUTANEOUS CORONARY STENT INTERVENTION (PCI-S);  Surgeon: Pamella Pert, MD;  Location: [facility] CATH LAB;  Service: Cardiovascular;; PCI of distal RCA stent subacute thrombosis: Promus Premier DES 3.0 mm x 20 mm.   PERCUTANEOUS CORONARY STENT INTERVENTION (PCI-S)  02/25/2013   Procedure: PERCUTANEOUS CORONARY STENT INTERVENTION (PCI-S);  Surgeon: Pamella Pert, MD;  Location: [facility] CATH LAB;  RCA CTO PCI with overlapping Promus DES: 3.0 mm x 38 mm, 3.5 mm x 16mm   RIGHT/LEFT HEART CATH AND CORONARY ANGIOGRAPHY N/A 11/04/2019   Procedure: RIGHT/LEFT HEART CATH AND CORONARY ANGIOGRAPHY;  Surgeon: Marykay Lex, MD;  Location: [facility] INVASIVE CV LAB;; Mildly elevated LVEDP.  Ostial D1 100%.  Proximal to mid LAD stent 10% ISR.  Mid  and distal stent widely patent.  30% distal distance.  Ostial OM 2 55%.  Proximal to mid RCA stent 50% stenosis.  Mid to distal RCA 40%.  Suspect that reduced EF is related to hypertensive emergen   TRANSTHORACIC ECHOCARDIOGRAM  07/04/2018   recurrent Anterior STEMI: EF 35-40%.  Apical hypokinesis.  GRII DD.  No thrombus noted.  (Improved EF from 30% up to 35-40% from previous echo)   TRANSTHORACIC ECHOCARDIOGRAM  11/2018; 01/2019   a) TAKOTUSBO CM: EF 20-25%-diffuse HK..  Mod V thickness.  GRII DD w/ high LVEDP.  Severe LA dilation;; b) 02/14/19: Moderate LVH.  EF improved up to 45-50%.  GR 1 DD.  Severe apical akinesis.  Normal valves.   TRANSTHORACIC ECHOCARDIOGRAM  10/31/2019   Hypertensive emergency, hypoxic/hypercapnic respiratory failure;  EF 20 to 25%.  Global HK.  Mildly dilated LV.  Mildly elevated PA pressures.  Mild LA dilation.  Mild AR.  Mildly elevated RAP-8 mmHg.=> Eventual cath showed no  change   TUBAL LIGATION  1994    Family History  Problem Relation Age of Onset   Diabetes Mother    Diabetes Sister    Diabetes Sister    Diabetes Son        type 1    Social History   Socioeconomic History   Marital status: Single    Spouse name: Not on file   Number of children: 3   Years of education: Not on file   Highest education level: 11th grade  Occupational History    Employer: [employer]  Tobacco Use   Smoking status: Every Day    Current packs/day: 0.25    Average packs/day: 0.3 packs/day for 22.0 years (5.5 ttl pk-yrs)    Types: Cigarettes   Smokeless tobacco: Never   Tobacco comments:    current smoker 4-5 cigarettes per day; previously smoked at least a pack a day.  Vaping Use   Vaping status: Never Used  Substance and Sexual Activity   Alcohol use: Yes    Alcohol/week: 0.0 standard drinks of alcohol    Comment: 07/04/2018 "only on special occasions; maybe once/yr"   Drug use: Yes    Types: Marijuana    Comment: 02/25/2013 "quit marijuana ~ 2001"   Sexual  activity: Not Currently    Birth control/protection: Surgical  Other Topics Concern   Not on file  Social History Narrative   Diet: 50/50 take out and home cook meals (eats burgers and lots of fried foods), limits portions.       Exercise: walks 15-20 minutes twice per week   Social Determinants of Health   Financial Resource Strain: Medium Risk (12/12/2022)   Overall Financial Resource Strain (CARDIA)    Difficulty of Paying Living Expenses: Somewhat hard  Food Insecurity: Food Insecurity Present (12/12/2022)   Hunger Vital Sign    Worried About Running Out of Food in the Last Year: Sometimes true    Ran Out of Food in the Last Year: Sometimes true  Transportation Needs: No Transportation Needs (12/12/2022)   PRAPARE - Administrator, Civil Service (Medical): No    Lack of Transportation (Non-Medical): No  Physical Activity: Inactive (12/12/2022)   Exercise Vital Sign    Days of Exercise per Week: 0 days    Minutes of Exercise per Session: 0 min  Stress: No Stress Concern Present (12/12/2022)   [employer] of Occupational Health - Occupational Stress Questionnaire    Feeling of Stress : Only a little  Social Connections: Moderately Integrated (12/12/2022)   Social Connection and Isolation Panel [NHANES]    Frequency of Communication with Friends and Family: Three times a week    Frequency of Social Gatherings with Friends and Family: Three times a week    Attends Religious Services: 1 to 4 times per year    Active Member of Clubs or Organizations: Yes    Attends Banker Meetings: 1 to 4 times per year    Marital Status: Divorced    Allergies  Allergen Reactions   Farxiga [Dapagliflozin] Other (See Comments)    Caused UTI and polyps on kidneys     Outpatient Medications Prior to Visit  Medication Sig Dispense Refill   carvedilol (COREG) 25 MG tablet Take 1 tablet (25 mg total) by mouth 2 (two) times daily. 180 tablet 1   clopidogrel (PLAVIX) 75  MG tablet Take 1 tablet (75 mg total) by mouth daily. 90 tablet 0   Continuous Glucose Receiver [facility]  G7 RECEIVER) DEVI Use to check blood sugar throughout the day. E11.65 1 each 0   Continuous Glucose Sensor (DEXCOM G7 SENSOR) MISC Use to check blood sugar throughout the day. Change sensors once every 10 days. E11.65 3 each 6   cyclobenzaprine (FLEXERIL) 5 MG tablet Take 1 tablet (5 mg total) by mouth at bedtime as needed for muscle spasms. 10 tablet 0   Dulaglutide (TRULICITY) 1.5 MG/0.5ML SOPN Inject 1.5 mg into the skin once a week. Start once you finish 4 weeks of the 0.75mg  pen. 6 mL 1   Evolocumab (REPATHA SURECLICK) 140 MG/ML SOAJ Inject 140 mg into the skin every 14 (fourteen) days. Inject one pen into the subcutaneous tissue every 14 days     furosemide (LASIX) 20 MG tablet Take 1 tablet (20 mg total) by mouth as needed. 180 tablet 1   hydrALAZINE (APRESOLINE) 10 MG tablet Take 1 tablet (10 mg total) by mouth in the morning and at bedtime. 180 tablet 3   insulin glargine (LANTUS SOLOSTAR) 100 UNIT/ML Solostar Pen Inject 26 Units into the skin daily. 15 mL 2   Insulin Pen Needle (TECHLITE PEN NEEDLES) 32G X 4 MM MISC Use 1 pen needle with insulin once daily 100 each 2   Multiple Vitamins-Minerals (WOMENS 50+ MULTI VITAMIN PO) Take 1 tablet by mouth daily.     naproxen sodium (ALEVE) 220 MG tablet Take 220 mg by mouth daily as needed (pain).     nitroGLYCERIN (NITROSTAT) 0.4 MG SL tablet Place 1 tablet (0.4 mg total) under the tongue every 5 (five) minutes as needed for chest pain. Reported on 11/25/2015 25 tablet 3   rosuvastatin (CRESTOR) 40 MG tablet Take 1 tablet (40 mg total) by mouth daily. 90 tablet 1   sacubitril-valsartan (ENTRESTO) 97-103 MG Take 1 tablet by mouth 2 (two) times daily. 180 tablet 2   spironolactone (ALDACTONE) 25 MG tablet Take 0.5 tablets (12.5 mg total) by mouth daily. 90 tablet 1   TRUEPLUS LANCETS 28G MISC 28 g by Does not apply route 4 (four) times daily. 120  each 2   gabapentin (NEURONTIN) 300 MG capsule Take 1 capsule (300 mg total) by mouth 3 (three) times daily. (Patient not taking: Reported on 09/26/2022) 90 capsule 6   Varenicline Tartrate, Starter, 0.5 MG X 11 & 1 MG X 42 TBPK Take 0.5 mg daily for 3 days, then 0.5 mg twice daily for 4 days, then 1 mg twice daily thereafter. (Patient not taking: Reported on 02/21/2023) 53 each 0   No facility-administered medications prior to visit.     ROS Review of Systems  Constitutional:  Negative for activity change and appetite change.  HENT:  Negative for sinus pressure and sore throat.   Respiratory:  Negative for chest tightness, shortness of breath and wheezing.   Cardiovascular:  Negative for chest pain and palpitations.  Gastrointestinal:  Negative for abdominal distention, abdominal pain and constipation.  Genitourinary: Negative.   Musculoskeletal:        Left knee pain  Psychiatric/Behavioral:  Negative for behavioral problems and dysphoric mood.     Objective:  BP (!) 153/83   Pulse 74   Temp 98.4 F (36.9 C) (Oral)   Ht 5\' 9"  (1.753 m)   Wt 222 lb (100.7 kg)   LMP 10/20/2015   SpO2 100%   BMI 32.78 kg/m      02/21/2023   11:05 AM 02/21/2023   10:09 AM 01/30/2023    9:04 AM  BP/Weight  Systolic BP 153 164 137  Diastolic BP 83 80 76  Wt. (Lbs)  222   BMI  32.78 kg/m2       Physical Exam Exam conducted with a chaperone present.  Constitutional:      General: She is not in acute distress.    Appearance: She is well-developed. She is not diaphoretic.  HENT:     Head: Normocephalic.     Right Ear: External ear normal.     Left Ear: External ear normal.     Nose: Nose normal.  Eyes:     Conjunctiva/sclera: Conjunctivae normal.     Pupils: Pupils are equal, round, and reactive to light.  Neck:     Vascular: No JVD.  Cardiovascular:     Rate and Rhythm: Normal rate and regular rhythm.     Heart sounds: Normal heart sounds. No murmur heard.    No gallop.   Pulmonary:     Effort: Pulmonary effort is normal. No respiratory distress.     Breath sounds: Normal breath sounds. No wheezing or rales.  Chest:     Chest wall: No tenderness.  Breasts:    Right: Normal. No mass, nipple discharge or tenderness.     Left: Normal. No mass, nipple discharge or tenderness.  Abdominal:     General: Bowel sounds are normal. There is no distension.     Palpations: Abdomen is soft. There is no mass.     Tenderness: There is no abdominal tenderness.     Hernia: There is no hernia in the left inguinal area or right inguinal area.  Genitourinary:    General: Normal vulva.     Pubic Area: No rash.      Labia:        Right: No rash.        Left: No rash.      Vagina: Normal.     Cervix: Normal.     Uterus: Normal.      Adnexa: Right adnexa normal and left adnexa normal.       Right: No tenderness.         Left: No tenderness.    Musculoskeletal:        General: No tenderness. Normal range of motion.     Cervical back: Normal range of motion. No tenderness.  Lymphadenopathy:     Upper Body:     Right upper body: No supraclavicular or axillary adenopathy.     Left upper body: No supraclavicular or axillary adenopathy.  Skin:    General: Skin is warm and dry.  Neurological:     Mental Status: She is alert and oriented to person, place, and time.     Deep Tendon Reflexes: Reflexes are normal and symmetric.        Latest Ref Rng & Units 07/06/2022    3:56 PM 03/17/2022    3:30 PM 03/14/2022    4:53 PM  CMP  Glucose 70 - 99 mg/dL 409  72  811   BUN 6 - 20 mg/dL 9  22  24    Creatinine 0.44 - 1.00 mg/dL 9.14  7.82  9.56   Sodium 135 - 145 mmol/L 134  142  135   Potassium 3.5 - 5.1 mmol/L 3.7  3.6  3.1   Chloride 98 - 111 mmol/L 100  99  92   CO2 22 - 32 mmol/L 23  25  26    Calcium 8.9 - 10.3 mg/dL 9.0  9.4  21.3  Total Protein 6.5 - 8.1 g/dL 7.5   8.1   Total Bilirubin 0.3 - 1.2 mg/dL 0.7   0.5   Alkaline Phos 38 - 126 U/L 39   50   AST 15 - 41  U/L 20   16   ALT 0 - 44 U/L 15   11     Lipid Panel     Component Value Date/Time   CHOL 113 10/18/2021 1057   TRIG 79 10/18/2021 1057   HDL 48 10/18/2021 1057   CHOLHDL 2.4 10/18/2021 1057   CHOLHDL 4.2 07/05/2018 0330   VLDL 15 07/05/2018 0330   LDLCALC 49 10/18/2021 1057    CBC    Component Value Date/Time   WBC 3.5 (L) 07/06/2022 1556   RBC 4.37 07/06/2022 1556   HGB 11.8 (L) 07/06/2022 1556   HGB 11.0 (L) 03/17/2022 1530   HCT 35.4 (L) 07/06/2022 1556   HCT 33.3 (L) 03/17/2022 1530   PLT 252 07/06/2022 1556   PLT 352 03/17/2022 1530   MCV 81.0 07/06/2022 1556   MCV 81 03/17/2022 1530   MCH 27.0 07/06/2022 1556   MCHC 33.3 07/06/2022 1556   RDW 15.2 07/06/2022 1556   RDW 12.6 03/17/2022 1530   LYMPHSABS 0.9 07/06/2022 1556   LYMPHSABS 3.1 03/14/2022 1653   MONOABS 0.6 07/06/2022 1556   EOSABS 0.2 07/06/2022 1556   EOSABS 0.4 03/14/2022 1653   BASOSABS 0.1 07/06/2022 1556   BASOSABS 0.1 03/14/2022 1653    Lab Results  Component Value Date   HGBA1C 7.9 (A) 01/08/2023    Assessment & Plan:     1. Annual visit for general adult medical examination with abnormal findings Counseled on 150 minutes of exercise per week, healthy eating (including decreased daily intake of saturated fats, cholesterol, added sugars, sodium), STI prevention, routine healthcare maintenance. - Microalbumin/Creatinine Ratio, Urine  2. Screening for cervical cancer - Cytology - PAP  3. Screening for colon cancer - Ambulatory referral to Gastroenterology  4. Screening for STD (sexually transmitted disease) - Cervicovaginal ancillary only  5. Hypertensive heart disease with chronic combined systolic and diastolic congestive heart failure (HCC) Elevated blood pressure which was previously normal at last visit She attributes this to taking her antihypertensive late Continue with antihypertensive and I will reassess blood pressure at next visit.              No orders of the  defined types were placed in this encounter.   Follow-up: Return in about 1 month (around 03/24/2023) for Blood Pressure follow-up.       Hoy Register, MD, FAAFP. [facility] [place], [place] [phone]   02/21/2023, 11:08 AM

## 2023-02-21 NOTE — Patient Instructions (Signed)

## 2023-02-22 LAB — CERVICOVAGINAL ANCILLARY ONLY
Chlamydia: NEGATIVE
Comment: NEGATIVE
Comment: NORMAL

## 2023-02-22 LAB — MICROALBUMIN / CREATININE URINE RATIO
Creatinine, Urine: 132.9 mg/dL
Microalb/Creat Ratio: 76 mg/g creat — ABNORMAL HIGH (ref 0–29)
Microalbumin, Urine: 100.6 ug/mL

## 2023-02-26 ENCOUNTER — Other Ambulatory Visit: Payer: Self-pay

## 2023-02-26 ENCOUNTER — Other Ambulatory Visit: Payer: Self-pay | Admitting: Family Medicine

## 2023-02-26 DIAGNOSIS — R87610 Atypical squamous cells of undetermined significance on cytologic smear of cervix (ASC-US): Secondary | ICD-10-CM

## 2023-02-27 ENCOUNTER — Telehealth: Payer: Self-pay | Admitting: *Deleted

## 2023-02-27 NOTE — Telephone Encounter (Signed)
Patient called for results and notified: Please inform her that her Pap smear revealed slightly abnormal cells and I have referred her to GYN for further evaluation.  They will contact her to set up an appointment.  Urine shows she does have slight protein but since she is allergic to [place] I am unable to place her on this.  Advised to continue to work on ensuring good diabetic control.

## 2023-02-28 ENCOUNTER — Other Ambulatory Visit: Payer: Self-pay

## 2023-03-01 ENCOUNTER — Other Ambulatory Visit: Payer: Self-pay

## 2023-03-01 ENCOUNTER — Encounter: Payer: Self-pay | Admitting: Internal Medicine

## 2023-03-02 ENCOUNTER — Ambulatory Visit: Payer: 59 | Admitting: Pharmacist

## 2023-03-05 ENCOUNTER — Other Ambulatory Visit: Payer: Self-pay

## 2023-03-29 ENCOUNTER — Other Ambulatory Visit: Payer: Self-pay

## 2023-03-29 ENCOUNTER — Other Ambulatory Visit (HOSPITAL_BASED_OUTPATIENT_CLINIC_OR_DEPARTMENT_OTHER): Payer: Self-pay

## 2023-03-29 ENCOUNTER — Other Ambulatory Visit: Payer: Self-pay | Admitting: Physician Assistant

## 2023-03-29 ENCOUNTER — Other Ambulatory Visit (HOSPITAL_COMMUNITY): Payer: Self-pay

## 2023-03-29 DIAGNOSIS — I25110 Atherosclerotic heart disease of native coronary artery with unstable angina pectoris: Secondary | ICD-10-CM

## 2023-03-29 MED ORDER — CLOPIDOGREL BISULFATE 75 MG PO TABS
75.0000 mg | ORAL_TABLET | Freq: Every day | ORAL | 0 refills | Status: DC
Start: 2023-03-29 — End: 2023-06-29
  Filled 2023-03-29: qty 30, 30d supply, fill #0
  Filled 2023-04-26 – 2023-04-27 (×2): qty 30, 30d supply, fill #1
  Filled 2023-05-29: qty 30, 30d supply, fill #2

## 2023-03-30 ENCOUNTER — Other Ambulatory Visit: Payer: Self-pay

## 2023-04-03 ENCOUNTER — Other Ambulatory Visit: Payer: Self-pay

## 2023-04-03 ENCOUNTER — Telehealth: Payer: Self-pay

## 2023-04-03 ENCOUNTER — Ambulatory Visit: Payer: 59 | Attending: Family Medicine | Admitting: Family Medicine

## 2023-04-03 VITALS — BP 147/85 | HR 72 | Ht 69.0 in | Wt 218.0 lb

## 2023-04-03 DIAGNOSIS — K2100 Gastro-esophageal reflux disease with esophagitis, without bleeding: Secondary | ICD-10-CM | POA: Diagnosis not present

## 2023-04-03 DIAGNOSIS — I10 Essential (primary) hypertension: Secondary | ICD-10-CM

## 2023-04-03 DIAGNOSIS — I5042 Chronic combined systolic (congestive) and diastolic (congestive) heart failure: Secondary | ICD-10-CM

## 2023-04-03 MED ORDER — HYDRALAZINE HCL 25 MG PO TABS
25.0000 mg | ORAL_TABLET | Freq: Two times a day (BID) | ORAL | 3 refills | Status: DC
Start: 2023-04-03 — End: 2023-09-26
  Filled 2023-04-03: qty 60, 30d supply, fill #0
  Filled 2023-04-26 – 2023-04-27 (×2): qty 60, 30d supply, fill #1
  Filled 2023-05-29: qty 60, 30d supply, fill #2
  Filled 2023-06-29: qty 60, 30d supply, fill #3
  Filled 2023-07-31 (×2): qty 60, 30d supply, fill #4
  Filled 2023-08-30: qty 60, 30d supply, fill #5

## 2023-04-03 NOTE — Patient Instructions (Signed)
Managing Your Hypertension Hypertension, also called high blood pressure, is when the force of the blood pressing against the walls of the arteries is too strong. Arteries are blood vessels that carry blood from your heart throughout your body. Hypertension forces the heart to work harder to pump blood and may cause the arteries to become narrow or stiff. Understanding blood pressure readings A blood pressure reading includes a higher number over a lower number: The first, or top, number is called the systolic pressure. It is a measure of the pressure in your arteries as your heart beats. The second, or bottom number, is called the diastolic pressure. It is a measure of the pressure in your arteries as the heart relaxes. For most people, a normal blood pressure is below 120/80. Your personal target blood pressure may vary depending on your medical conditions, your age, and other factors. Blood pressure is classified into four stages. Based on your blood pressure reading, your health care provider may use the following stages to determine what type of treatment you need, if any. Systolic pressure and diastolic pressure are measured in a unit called millimeters of mercury (mmHg). Normal Systolic pressure: below 120. Diastolic pressure: below 80. Elevated Systolic pressure: 120-129. Diastolic pressure: below 80. Hypertension stage 1 Systolic pressure: 130-139. Diastolic pressure: 80-89. Hypertension stage 2 Systolic pressure: 140 or above. Diastolic pressure: 90 or above. How can this condition affect me? Managing your hypertension is very important. Over time, hypertension can damage the arteries and decrease blood flow to parts of the body, including the brain, heart, and kidneys. Having untreated or uncontrolled hypertension can lead to: A heart attack. A stroke. A weakened blood vessel (aneurysm). Heart failure. Kidney damage. Eye damage. Memory and concentration problems. Vascular  dementia. What actions can I take to manage this condition? Hypertension can be managed by making lifestyle changes and possibly by taking medicines. Your health care provider will help you make a plan to bring your blood pressure within a normal range. You may be referred for counseling on a healthy diet and physical activity. Nutrition  Eat a diet that is high in fiber and potassium, and low in salt (sodium), added sugar, and fat. An example eating plan is called the DASH diet. DASH stands for Dietary Approaches to Stop Hypertension. To eat this way: Eat plenty of fresh fruits and vegetables. Try to fill one-half of your plate at each meal with fruits and vegetables. Eat whole grains, such as whole-wheat pasta, brown rice, or whole-grain bread. Fill about one-fourth of your plate with whole grains. Eat low-fat dairy products. Avoid fatty cuts of meat, processed or cured meats, and poultry with skin. Fill about one-fourth of your plate with lean proteins such as fish, chicken without skin, beans, eggs, and tofu. Avoid pre-made and processed foods. These tend to be higher in sodium, added sugar, and fat. Reduce your daily sodium intake. Many people with hypertension should eat less than 1,500 mg of sodium a day. Lifestyle  Work with your health care provider to maintain a healthy body weight or to lose weight. Ask what an ideal weight is for you. Get at least 30 minutes of exercise that causes your heart to beat faster (aerobic exercise) most days of the week. Activities may include walking, swimming, or biking. Include exercise to strengthen your muscles (resistance exercise), such as weight lifting, as part of your weekly exercise routine. Try to do these types of exercises for 30 minutes at least 3 days a week. Do   not use any products that contain nicotine or tobacco. These products include cigarettes, chewing tobacco, and vaping devices, such as e-cigarettes. If you need help quitting, ask your  health care provider. Control any long-term (chronic) conditions you have, such as high cholesterol or diabetes. Identify your sources of stress and find ways to manage stress. This may include meditation, deep breathing, or making time for fun activities. Alcohol use Do not drink alcohol if: Your health care provider tells you not to drink. You are pregnant, may be pregnant, or are planning to become pregnant. If you drink alcohol: Limit how much you have to: 0-1 drink a day for women. 0-2 drinks a day for men. Know how much alcohol is in your drink. In the [place] one drink equals one 12 oz bottle of beer (355 mL), one 5 oz glass of wine (148 mL), or one 1 oz glass of hard liquor (44 mL). Medicines Your health care provider may prescribe medicine if lifestyle changes are not enough to get your blood pressure under control and if: Your systolic blood pressure is 130 or higher. Your diastolic blood pressure is 80 or higher. Take medicines only as told by your health care provider. Follow the directions carefully. Blood pressure medicines must be taken as told by your health care provider. The medicine does not work as well when you skip doses. Skipping doses also puts you at risk for problems. Monitoring Before you monitor your blood pressure: Do not smoke, drink caffeinated beverages, or exercise within 30 minutes before taking a measurement. Use the bathroom and empty your bladder (urinate). Sit quietly for at least 5 minutes before taking measurements. Monitor your blood pressure at home as told by your health care provider. To do this: Sit with your back straight and supported. Place your feet flat on the floor. Do not cross your legs. Support your arm on a flat surface, such as a table. Make sure your upper arm is at heart level. Each time you measure, take two or three readings one minute apart and record the results. You may also need to have your blood pressure checked regularly by  your health care provider. General information Talk with your health care provider about your diet, exercise habits, and other lifestyle factors that may be contributing to hypertension. Review all the medicines you take with your health care provider because there may be side effects or interactions. Keep all follow-up visits. Your health care provider can help you create and adjust your plan for managing your high blood pressure. Where to find more information National [employer], [employer], [employer]: www.nhlbi.nih.gov American [employer] Association: www.heart.org Contact a health care provider if: You think you are having a reaction to medicines you have taken. You have repeated (recurrent) headaches. You feel dizzy. You have swelling in your ankles. You have trouble with your vision. Get help right away if: You develop a severe headache or confusion. You have unusual weakness or numbness, or you feel faint. You have severe pain in your chest or abdomen. You vomit repeatedly. You have trouble breathing. These symptoms may be an emergency. Get help right away. Call 911. Do not wait to see if the symptoms will go away. Do not drive yourself to the hospital. Summary Hypertension is when the force of blood pumping through your arteries is too strong. If this condition is not controlled, it may put you at risk for serious complications. Your personal target blood pressure may vary depending on your medical conditions,   your age, and other factors. For most people, a normal blood pressure is less than 120/80. Hypertension is managed by lifestyle changes, medicines, or both. Lifestyle changes to help manage hypertension include losing weight, eating a healthy, low-sodium diet, exercising more, stopping smoking, and limiting alcohol. This information is not intended to replace advice given to you by your health care provider. Make sure you discuss any questions you have with your health care  provider. Document Revised: 03/31/2021 Document Reviewed: 03/31/2021 [employer] Patient Education  2024 [employer]  

## 2023-04-03 NOTE — Progress Notes (Signed)
Subjective:  Patient ID: Amy Chaney, female    DOB: 1970/09/26  Age: 52 y.o. MRN: [id number]  CC: Medical Management of Chronic Issues (No questions or concerns)   HPI Amy Chaney is a 52 y.o. year old female with a history of  Type 2 diabetes mellitus (A1c 7.9), hypertension, CHF (EF 50 to 55% from echo of 02/2021), CAD (status post PCI to LAD with 3 overlapping DES in 09/2017), STEMI in 06/2018.   Interval History: Discussed the use of AI scribe software for clinical note transcription with the patient, who gave verbal consent to proceed.   She was brought back for a follow-up visit due to previously elevated blood pressure readings. Despite taking her antihypertensive medications, including carvedilol 25mg , furosemide, and hydralazine 10mg  twice daily, her blood pressure remains slightly elevated at 149/81. The patient has a home monitor but has not been checking her blood pressure regularly.  In addition to her hypertension, the patient is dealing with significant emotional distress following the sudden death of her son, Wilmon Pali. The patient describes feeling weak and has been isolating herself, not answering phone calls from family. She expresses financial strain due to the costs of the funeral and loss of Sebastian's contribution to the rent. The patient also mentions experiencing nightly heartburn, for which she has over-the-counter medication but has not yet started taking.  Endorses eating late.       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 s/p PCI of the mid RCA 01/2013 //  b. LHC 9/14: EF 55%, RCA stent 100% occluded, LAD irregularities >>  subacute stent thrombosis, Promus DES PCI distal overlap // c) 09/2017 - Ant STEMI - LAD-D2 PCI - Successful PCI of LAD (3 overlapping DES), unable to restore flow down D2l that was stented as well.  Likely related to  downstream dissection, but unable to rewire.   Daily headache    Depression    Dyslipidemia, goal LDL below 70    History of Doppler ultrasound    carotid bruit >> a. 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  EF down to 20-25% global hypokinesis.  Was in the setting of grieving over the loss of her son from MI.  ->  EF improved to 45-50% on follow-up echocardiogram.   Hypertension    Ischemic cardiomyopathy 06/2018   s/p inferior STEMI followed by 2 anterior STEMIs: Following recovery from Takotsubo Cardiomyopathy- Echo 02/14/19: Moderate LVH.  EF improved up to 45-50%.  GR 1 DD.  Severe apical akinesis.  Normal valves.   Mild dysplasia of cervix (CIN I) 02/04/2019   Seen after colposcopy on 01/23/19 done for ASCU +HPV pap  > repeat pap and HPV test in 12 and 24 months   STEMI involving left anterior descending coronary artery (HCC) 09/2017; 06/2018   a) Severe Medina 1, 1, 1 LAD-D2 lesion (complicated by post PTCA dissection/intramural hematoma) -successful extensive PCI of the LAD but unable to maintain patency of the stented D2. b) distal mLAD stent Edge 100% thrombosis --> PTCA & Overlapping DES PCI (Synergy 3 x 12 --> 3.6-3.3 mm). D2 occluded. RCA stents patent, 65 % OM2. EF 30-35%.   STEMI involving oth coronary artery of inferior wall (HCC) 03/2013   Secondary to subacute stent thrombosis of RCA stent  segment having missed 4 doses of Effient.   Tobacco abuse 01/11/2016   Type II diabetes mellitus (HCC) 12/2010   sister and son also diabetic     Past Surgical History:  Procedure Laterality Date   CHOLECYSTECTOMY  ~ 2000   CORONARY STENT INTERVENTION N/A 10/10/2017   Procedure: CORONARY STENT INTERVENTION;  Surgeon: Marykay Lex, MD;  Location: [facility] INVASIVE CV LAB;  Service: Cardiovascular; LAD PCI: STENT SYNERGY DES 3.5X32 (p),STENT SYNERGY DES 3.5X 8 (m), STENT SYNERGY DES  3.5X28 (d).  D2 PCI: STENT SYNERGY DES 2.5X24.  (UNABL TO REWIRE & EXPAND - TO OF DIAG AT END OF CASE)   CORONARY STENT INTERVENTION N/A 07/03/2018   Procedure: CORONARY STENT INTERVENTION;  Surgeon: Marykay Lex, MD;  Location: [facility] INVASIVE CV LAB;; PTCA & overlapping DES PCI (overlaps prior stents distally) - Synergy DES 3 x 12 (3.6 mm @ overlap -- 3.3 mm distally)>   LEFT HEART CATH AND CORONARY ANGIOGRAPHY N/A 10/10/2017   Procedure: LEFT HEART CATH AND CORONARY ANGIOGRAPHY;  Surgeon: Marykay Lex, MD;  Location: [facility] INVASIVE CV LAB;  Service: Cardiovascular;  Laterality: N/A; -patent RCA stents with mild in-stent restenosis. Medina 1,1,1 mLD-D2 90% (complicated by dissection/intramural thrombus)   LEFT HEART CATH AND CORONARY ANGIOGRAPHY N/A 07/03/2018   Procedure: LEFT HEART CATH AND CORONARY ANGIOGRAPHY;  Surgeon: Marykay Lex, MD;  Location: [facility] INVASIVE CV LAB;; 100% thrombotic occlusion distal LAD stent -- DES PCI. continued 100% D2 (previously lost). patent RCA stents, 65% OM2.  EF 30-35%.   LEFT HEART CATHETERIZATION WITH CORONARY ANGIOGRAM N/A 02/25/2013   Procedure: LEFT HEART CATHETERIZATION WITH CORONARY ANGIOGRAM;  Surgeon: Pamella Pert, MD;  Location: [facility] CATH LAB;  Service: Cardiovascular;; CTO m RCA; otherwise normal coronaries   LEFT HEART CATHETERIZATION WITH CORONARY ANGIOGRAM N/A 04/01/2013   Procedure: LEFT HEART CATHETERIZATION WITH CORONARY ANGIOGRAM;  Surgeon: Pamella Pert, MD;  Location: [facility] CATH LAB;  Service: Cardiovascular: 100% occlusion of distal RCA stent (subacute stent thrombosis -  secondary to stopping Effient).  Overlapping DES PCI   NM MYOVIEW LTD  12/2015    EF 38%.  Hypertensive response to exercise (231/132 mmHg).  INTERMEDIATE RISK due to -diffuse hypokinesis and reduced EF.  No ischemia or infarction.   PERCUTANEOUS CORONARY STENT INTERVENTION (PCI-S)  04/01/2013   Procedure: PERCUTANEOUS CORONARY STENT INTERVENTION (PCI-S);  Surgeon: Pamella Pert, MD;  Location: [facility] CATH LAB;  Service: Cardiovascular;; PCI of distal RCA stent subacute thrombosis: Promus Premier DES 3.0 mm x 20 mm.   PERCUTANEOUS CORONARY STENT INTERVENTION (PCI-S)  02/25/2013   Procedure: PERCUTANEOUS CORONARY STENT INTERVENTION (PCI-S);  Surgeon: Pamella Pert, MD;  Location: [facility] CATH LAB;  RCA CTO PCI with overlapping Promus DES: 3.0 mm x 38 mm, 3.5 mm x 16mm   RIGHT/LEFT HEART CATH AND CORONARY ANGIOGRAPHY N/A 11/04/2019   Procedure: RIGHT/LEFT HEART CATH AND CORONARY ANGIOGRAPHY;  Surgeon: Marykay Lex, MD;  Location: [facility] INVASIVE CV LAB;; Mildly elevated LVEDP.  Ostial D1 100%.  Proximal to mid LAD stent 10% ISR.  Mid and distal stent widely patent.  30% distal distance.  Ostial OM 2 55%.  Proximal to mid RCA stent 50% stenosis.  Mid to distal RCA 40%.  Suspect that reduced EF is related to hypertensive emergen   TRANSTHORACIC ECHOCARDIOGRAM  07/04/2018   recurrent Anterior STEMI: EF 35-40%.  Apical hypokinesis.  GRII DD.  No thrombus noted.  (Improved EF from 30% up to 35-40% from previous  echo)   TRANSTHORACIC ECHOCARDIOGRAM  11/2018; 01/2019   a) TAKOTUSBO CM: EF 20-25%-diffuse HK..  Mod V thickness.  GRII DD w/ high LVEDP.  Severe LA dilation;; b) 02/14/19: Moderate LVH.  EF improved up to 45-50%.  GR 1 DD.  Severe apical akinesis.  Normal valves.   TRANSTHORACIC ECHOCARDIOGRAM  10/31/2019   Hypertensive emergency, hypoxic/hypercapnic respiratory failure;  EF 20 to 25%.  Global HK.  Mildly dilated LV.  Mildly elevated PA pressures.  Mild LA dilation.  Mild AR.  Mildly elevated RAP-8 mmHg.=> Eventual cath showed no change   TUBAL LIGATION  1994    Family History  Problem Relation Age of Onset   Diabetes Mother    Diabetes Sister    Diabetes Sister    Diabetes Son        type 1    Social History   Socioeconomic History   Marital status: Single    Spouse name: Not on file   Number of children: 3   Years of education: Not on file   Highest education  level: 11th grade  Occupational History    Employer: [employer]  Tobacco Use   Smoking status: Every Day    Current packs/day: 0.25    Average packs/day: 0.3 packs/day for 22.0 years (5.5 ttl pk-yrs)    Types: Cigarettes   Smokeless tobacco: Never   Tobacco comments:    current smoker 4-5 cigarettes per day; previously smoked at least a pack a day.  Vaping Use   Vaping status: Never Used  Substance and Sexual Activity   Alcohol use: Yes    Alcohol/week: 0.0 standard drinks of alcohol    Comment: 07/04/2018 "only on special occasions; maybe once/yr"   Drug use: Yes    Types: Marijuana    Comment: 02/25/2013 "quit marijuana ~ 2001"   Sexual activity: Not Currently    Birth control/protection: Surgical  Other Topics Concern   Not on file  Social History Narrative   Diet: 50/50 take out and home cook meals (eats burgers and lots of fried foods), limits portions.       Exercise: walks 15-20 minutes twice per week   Social Determinants of Health   Financial Resource Strain: Medium Risk (12/12/2022)   Overall Financial Resource Strain (CARDIA)    Difficulty of Paying Living Expenses: Somewhat hard  Food Insecurity: Food Insecurity Present (12/12/2022)   Hunger Vital Sign    Worried About Running Out of Food in the Last Year: Sometimes true    Ran Out of Food in the Last Year: Sometimes true  Transportation Needs: No Transportation Needs (12/12/2022)   PRAPARE - Administrator, Civil Service (Medical): No    Lack of Transportation (Non-Medical): No  Physical Activity: Inactive (12/12/2022)   Exercise Vital Sign    Days of Exercise per Week: 0 days    Minutes of Exercise per Session: 0 min  Stress: No Stress Concern Present (12/12/2022)   [employer] of Occupational Health - Occupational Stress Questionnaire    Feeling of Stress : Only a little  Social Connections: Moderately Integrated (12/12/2022)   Social Connection and Isolation Panel [NHANES]    Frequency of  Communication with Friends and Family: Three times a week    Frequency of Social Gatherings with Friends and Family: Three times a week    Attends Religious Services: 1 to 4 times per year    Active Member of Clubs or Organizations: Yes    Attends Banker  Meetings: 1 to 4 times per year    Marital Status: Divorced    Allergies  Allergen Reactions   Farxiga [Dapagliflozin] Other (See Comments)    Caused UTI and polyps on kidneys     Outpatient Medications Prior to Visit  Medication Sig Dispense Refill   carvedilol (COREG) 25 MG tablet Take 1 tablet (25 mg total) by mouth 2 (two) times daily. 180 tablet 1   clopidogrel (PLAVIX) 75 MG tablet Take 1 tablet (75 mg total) by mouth daily. 90 tablet 0   Continuous Glucose Receiver (DEXCOM G7 RECEIVER) DEVI Use to check blood sugar throughout the day. E11.65 1 each 0   Continuous Glucose Sensor (DEXCOM G7 SENSOR) MISC Use to check blood sugar throughout the day. Change sensors once every 10 days. E11.65 3 each 6   cyclobenzaprine (FLEXERIL) 5 MG tablet Take 1 tablet (5 mg total) by mouth at bedtime as needed for muscle spasms. 10 tablet 0   Dulaglutide (TRULICITY) 1.5 MG/0.5ML SOPN Inject 1.5 mg into the skin once a week. Start once you finish 4 weeks of the 0.75mg  pen. 6 mL 1   insulin glargine (LANTUS SOLOSTAR) 100 UNIT/ML Solostar Pen Inject 26 Units into the skin daily. 15 mL 2   Insulin Pen Needle (TECHLITE PEN NEEDLES) 32G X 4 MM MISC Use 1 pen needle with insulin once daily 100 each 2   Multiple Vitamins-Minerals (WOMENS 50+ MULTI VITAMIN PO) Take 1 tablet by mouth daily.     naproxen sodium (ALEVE) 220 MG tablet Take 220 mg by mouth daily as needed (pain).     nitroGLYCERIN (NITROSTAT) 0.4 MG SL tablet Place 1 tablet (0.4 mg total) under the tongue every 5 (five) minutes as needed for chest pain. Reported on 11/25/2015 25 tablet 3   rosuvastatin (CRESTOR) 40 MG tablet Take 1 tablet (40 mg total) by mouth daily. 90 tablet 1    sacubitril-valsartan (ENTRESTO) 97-103 MG Take 1 tablet by mouth 2 (two) times daily. 180 tablet 2   spironolactone (ALDACTONE) 25 MG tablet Take 0.5 tablets (12.5 mg total) by mouth daily. 90 tablet 1   Varenicline Tartrate, Starter, 0.5 MG X 11 & 1 MG X 42 TBPK Take 0.5 mg daily for 3 days, then 0.5 mg twice daily for 4 days, then 1 mg twice daily thereafter. 53 each 0   hydrALAZINE (APRESOLINE) 10 MG tablet Take 1 tablet (10 mg total) by mouth in the morning and at bedtime. 180 tablet 3   Evolocumab (REPATHA SURECLICK) 140 MG/ML SOAJ Inject 140 mg into the skin every 14 (fourteen) days. Inject one pen into the subcutaneous tissue every 14 days (Patient not taking: Reported on 04/03/2023)     furosemide (LASIX) 20 MG tablet Take 1 tablet (20 mg total) by mouth as needed. 180 tablet 1   gabapentin (NEURONTIN) 300 MG capsule Take 1 capsule (300 mg total) by mouth 3 (three) times daily. (Patient not taking: Reported on 09/26/2022) 90 capsule 6   TRUEPLUS LANCETS 28G MISC 28 g by Does not apply route 4 (four) times daily. (Patient not taking: Reported on 04/03/2023) 120 each 2   No facility-administered medications prior to visit.     ROS Review of Systems  Constitutional:  Negative for activity change and appetite change.  HENT:  Negative for sinus pressure and sore throat.   Respiratory:  Negative for chest tightness, shortness of breath and wheezing.   Cardiovascular:  Negative for chest pain and palpitations.  Gastrointestinal:  Negative for abdominal  distention, abdominal pain and constipation.  Genitourinary: Negative.   Musculoskeletal: Negative.   Psychiatric/Behavioral:  Negative for behavioral problems and dysphoric mood.     Objective:  BP (!) 147/85   Pulse 72   Ht 5\' 9"  (1.753 m)   Wt 218 lb (98.9 kg)   LMP 10/20/2015   SpO2 99%   BMI 32.19 kg/m      04/03/2023   12:00 PM 04/03/2023   11:12 AM 02/21/2023   11:05 AM  BP/Weight  Systolic BP 147 149 153  Diastolic BP 85 81 83   Wt. (Lbs)  218   BMI  32.19 kg/m2       Physical Exam Constitutional:      Appearance: She is well-developed.  Cardiovascular:     Rate and Rhythm: Normal rate.     Heart sounds: Normal heart sounds. No murmur heard. Pulmonary:     Effort: Pulmonary effort is normal.     Breath sounds: Normal breath sounds. No wheezing or rales.  Chest:     Chest wall: No tenderness.  Abdominal:     General: Bowel sounds are normal. There is no distension.     Palpations: Abdomen is soft. There is no mass.     Tenderness: There is no abdominal tenderness.  Musculoskeletal:        General: Normal range of motion.     Right lower leg: No edema.     Left lower leg: No edema.  Neurological:     Mental Status: She is alert and oriented to person, place, and time.  Psychiatric:        Mood and Affect: Mood normal.        Latest Ref Rng & Units 07/06/2022    3:56 PM 03/17/2022    3:30 PM 03/14/2022    4:53 PM  CMP  Glucose 70 - 99 mg/dL 161  72  096   BUN 6 - 20 mg/dL 9  22  24    Creatinine 0.44 - 1.00 mg/dL 0.45  4.09  8.11   Sodium 135 - 145 mmol/L 134  142  135   Potassium 3.5 - 5.1 mmol/L 3.7  3.6  3.1   Chloride 98 - 111 mmol/L 100  99  92   CO2 22 - 32 mmol/L 23  25  26    Calcium 8.9 - 10.3 mg/dL 9.0  9.4  91.4   Total Protein 6.5 - 8.1 g/dL 7.5   8.1   Total Bilirubin 0.3 - 1.2 mg/dL 0.7   0.5   Alkaline Phos 38 - 126 U/L 39   50   AST 15 - 41 U/L 20   16   ALT 0 - 44 U/L 15   11     Lipid Panel     Component Value Date/Time   CHOL 113 10/18/2021 1057   TRIG 79 10/18/2021 1057   HDL 48 10/18/2021 1057   CHOLHDL 2.4 10/18/2021 1057   CHOLHDL 4.2 07/05/2018 0330   VLDL 15 07/05/2018 0330   LDLCALC 49 10/18/2021 1057    CBC    Component Value Date/Time   WBC 3.5 (L) 07/06/2022 1556   RBC 4.37 07/06/2022 1556   HGB 11.8 (L) 07/06/2022 1556   HGB 11.0 (L) 03/17/2022 1530   HCT 35.4 (L) 07/06/2022 1556   HCT 33.3 (L) 03/17/2022 1530   PLT 252 07/06/2022 1556   PLT  352 03/17/2022 1530   MCV 81.0 07/06/2022 1556   MCV 81 03/17/2022 1530  MCH 27.0 07/06/2022 1556   MCHC 33.3 07/06/2022 1556   RDW 15.2 07/06/2022 1556   RDW 12.6 03/17/2022 1530   LYMPHSABS 0.9 07/06/2022 1556   LYMPHSABS 3.1 03/14/2022 1653   MONOABS 0.6 07/06/2022 1556   EOSABS 0.2 07/06/2022 1556   EOSABS 0.4 03/14/2022 1653   BASOSABS 0.1 07/06/2022 1556   BASOSABS 0.1 03/14/2022 1653    Lab Results  Component Value Date   HGBA1C 7.9 (A) 01/08/2023    Assessment & Plan:      Hypertension Blood pressure slightly elevated at 149/81 despite adherence to current regimen of carvedilol 25mg , furosemide, and hydralazine 10mg  BID. -Plan to recheck blood pressure today. -If still elevated, increase hydralazine from 10mg  to 25mg .  Gastroesophageal Reflux Disease (GERD) Reports of nightly heartburn. -Advise to avoid late meals and to remain upright for at least 2 hours after eating. -Start over-the-counter heartburn medication from [facility] as needed.  General Health Maintenance -Colon cancer screening appointment scheduled. -Encourage grief counseling following recent loss of son. -Plan to discuss potential resources with clinic case Production designer, theatre/television/film.          Meds ordered this encounter  Medications   hydrALAZINE (APRESOLINE) 25 MG tablet    Sig: Take 1 tablet (25 mg total) by mouth in the morning and at bedtime.    Dispense:  90 tablet    Refill:  3    Dose increase    Follow-up: Return in about 2 months (around 06/03/2023) for Chronic medical conditions.       Hoy Register, MD, FAAFP. [facility] [place], [place] [phone]   04/03/2023, 12:16 PM

## 2023-04-03 NOTE — Telephone Encounter (Addendum)
At the request of Dr Alvis Lemmings, I met with the patient when she was in the clinic today.  She is trying to cope with the recent sudden loss of her son, Wilmon Pali.    She explained that her rent had been $655/month and it was recently increased to $700/month.  This is section 8 housing.  Wilmon Pali had been paying 1/2 of her rent and now it will be very difficult for her to pay the rent payments with her monthly salary of $1200-1300/month    She said she now owes $700 rent which is due 04/05/2023.  She would like to know if her rent could be decreased since he son is gone.  She was in agreement to placing a referral to [employer] [place] for possible assistance addressing her rent increase. The referral was then sent to [facility] as discussed.    She also explained that her electric bill is about $500.  She has been to the [employer] seeking some assistance with her bills as well as inquiring about transferring to a 1st floor apartment.  She said she has requested a 1st floor apartment from her landlord but was told there are no 1st floor apartments available.  I informed her that I can refer her to [facility] care management for assistance navigating her insurance benefits as they may offer some financial assistance with her utility bill and/ or rent and she was agreeable to placing that referral.   Regarding her current rent, I explained to her that I will try to find some financial assistance for her  but I would need to speak to her landlord to obtain a statement with what her rent is and what she currently  owes.  She was in agreement to having me contact her landlord and she gave me the landlord contact information: [employer]- Jamie: [phone].    I spoke to [employer] and requested the patient's account statement with current balance and monthly rent and she said she would email that information to me   Email sent to [facility] Patient Assistance Fund requesting funding for  patient's September rent.

## 2023-04-05 NOTE — Telephone Encounter (Signed)
Message received from [employer] [place] stating that they instructed the patient to contact Section 8 about her housing request.   Approval received from the [facility] to pay patient's $700 rent.    I called Jamie/ [employer] and informed her that I received approval to pay patient's rent this month but I still need a statement for her with what the patient owes and what her monthly rent is.  I also need the address of their office- where the check needs to be mailed to and their tax ID number. I provided Asher Muir with my email and she said she would send the requested information as soon as possible.  I then spoke to the patient and informed her of the approval for her rent payment this month.  I obtained verbal authorization from her to complete the Patient Assistance Acknowledgement Form. I told her what I am waiting for from the landlord and explained that I can request a check as soon as I receive that information.  She said she understood and was very Adult nurse.   She then asked about applying for food stamps. She said she has an application and will call me if she has any questions or needs assistance.    Regarding her utility bill, she said she filled out forms at [employer] today requesting assistance with that bill and she said she should hear a decision about her application tomorrow.

## 2023-04-11 ENCOUNTER — Telehealth: Payer: Self-pay

## 2023-04-11 NOTE — Telephone Encounter (Signed)
Information needed for check request received from patient's landlord and forwarded to Ailene Ards, Research officer, political party

## 2023-04-11 NOTE — Telephone Encounter (Signed)
  Medicaid Managed Care   Unsuccessful Outreach Note  04/11/2023 Name: Amy Chaney MRN: [id number] DOB: Oct 04, 1970  Referred by: Hoy Register, MD Reason for referral : Appointment (I called the patient today to get her scheduled with the MM Team. I had to leave a message on her VM.)   An unsuccessful telephone outreach was attempted today. The patient was referred to the case management team for assistance with care management and care coordination.   Follow Up Plan: A HIPAA compliant phone message was left for the patient providing contact information and requesting a return call.  The care management team will reach out to the patient again over the next 7 days.   Weston Settle Care Guide  [facility] Managed  Care Guide [facility]  [phone]

## 2023-04-13 ENCOUNTER — Encounter: Payer: Self-pay | Admitting: Physician Assistant

## 2023-04-13 ENCOUNTER — Ambulatory Visit: Payer: 59 | Attending: Physician Assistant | Admitting: Physician Assistant

## 2023-04-13 VITALS — BP 124/66 | HR 79 | Ht 69.0 in | Wt 216.8 lb

## 2023-04-13 DIAGNOSIS — I255 Ischemic cardiomyopathy: Secondary | ICD-10-CM

## 2023-04-13 DIAGNOSIS — I25119 Atherosclerotic heart disease of native coronary artery with unspecified angina pectoris: Secondary | ICD-10-CM | POA: Diagnosis not present

## 2023-04-13 DIAGNOSIS — I10 Essential (primary) hypertension: Secondary | ICD-10-CM

## 2023-04-13 DIAGNOSIS — E785 Hyperlipidemia, unspecified: Secondary | ICD-10-CM | POA: Diagnosis not present

## 2023-04-13 DIAGNOSIS — Z79899 Other long term (current) drug therapy: Secondary | ICD-10-CM | POA: Diagnosis not present

## 2023-04-13 DIAGNOSIS — Z794 Long term (current) use of insulin: Secondary | ICD-10-CM | POA: Diagnosis not present

## 2023-04-13 DIAGNOSIS — E119 Type 2 diabetes mellitus without complications: Secondary | ICD-10-CM | POA: Diagnosis not present

## 2023-04-13 NOTE — Patient Instructions (Signed)
Medication Instructions:  NO CHANGES *If you need a refill on your cardiac medications before your next appointment, please call your pharmacy*   Lab Work: CMP, CBC AND FASTING LIPIDS TODAY. If you have labs (blood work) drawn today and your tests are completely normal, you will receive your results only by: MyChart Message (if you have MyChart) OR A paper copy in the mail If you have any lab test that is abnormal or we need to change your treatment, we will call you to review the results.   Testing/Procedures: NO TESTING   Follow-Up: At [facility], you and your health needs are our priority.  As part of our continuing mission to provide you with exceptional heart care, we have created designated Provider Care Teams.  These Care Teams include your primary Cardiologist (physician) and Advanced Practice Providers (APPs -  Physician Assistants and Nurse Practitioners) who all work together to provide you with the care you need, when you need it.   Your next appointment:   6 month(s)  Provider:   Bryan Lemma, MD

## 2023-04-13 NOTE — Progress Notes (Signed)
Cardiology Office Note:  .   Date:  04/13/2023  ID:  Amy Chaney, DOB [birth year], MRN [id number] PCP: Hoy Register, MD  [facility] Providers Cardiologist:  Bryan Lemma, MD    History of Present Illness: .   Amy Chaney is a 52 y.o. female with a hx of CAD, ICM, chronic combined systolic and diastolic heart failure, HTN, HLD, DM2 and chronic tobacco abuse.  Patient had abnormal Myoview in July 2014 and subsequently underwent DES PCI of proximal to mid RCA with 2 overlapping drug-eluting stents by Dr. Jacinto Halim.  She had another NSTEMI in September 2014 after stopping Effient for 4 days, this resulted in 100% subacute stent thrombosis of RCA stent, this was treated with distal overlapping drug-eluting stent.  She had anterior STEMI in March 2019 involve the bifurcation of D2 and LAD that was treated with drug-eluting stent.  She unfortunately had another an anterior STEMI in December 2019 with subacute stent thrombosis treated with DES to distal LAD.  EF was 35 to 40% the time.  Patient was admitted for flash pulmonary edema in May 2020, ejection fraction was 20 to 25% with signs of Takotsubo cardiomyopathy.  This occurred in the setting of her son's death.  She was started on carvedilol, Entresto and spironolactone.  Repeat echocardiogram in July 2020 shows EF has improved to 45 to 50%, moderate LVH, grade 1 DD, severe apical akinesis.  Patient was admitted again in April 2021 due to flash pulmonary edema and acute respiratory failure.  EF was down to 20 to 25% again on echocardiogram.  Left and right heart catheterization performed on 11/04/2019 showed 100% occluded ostial D2, patent stent in the LAD, 50% OM 2 lesion, 40% mid to distal RCA lesion.  Coronary anatomy was relatively stable, it was suspected that her reduced ejection fraction was due to hypertensive urgency.  Patient was admitted in December 2021 due to bilateral pyelonephritis and E. coli bacteremia.  She was discharged on  antibiotic. Repeat echocardiogram obtained on 03/10/2021 showed EF essentially normalized at 50 to 55%, akinesis of the apex, normal RV size, mild MR.   I saw the patient in July 2023 for bubbly sensation in the chest and a dizzy spell.  Her chest discomfort was quite atypical, therefore we recommended continue observation.  A heart monitor was obtained for the palpitation which showed minimal heart rate of 46, maximal heart rate 119, average heart rate 67, predominantly sinus rhythm with 2 SVT, longest only lasted 17 beats.  PVC burden less than 1%.  I last saw the patient in November 2023 at which time she was doing well.  Patient presents today for delayed follow-up.  She has been doing well from the cardiac perspective denying any chest pain or worsening dyspnea.  She is not very active.  She continued to smoke.  Tobacco cessation strongly advised.  She has been feeling down as one of her other son has recently passed away as well.  She still have a daughter and grandkids home she visits often.  EKG today showed downsloping ST segment in lead III that looks little worse compared to the previous EKG, however patient denies any anginal symptom.  Will continue to observe for the time being.  She is aware to contact cardiology service if she does have any exertional symptoms.  Since the recent PCP visit, her hydralazine has been increased to 25 mg twice a day.  On initial arrival today her blood pressure was high at 144/74, however  was no ST segment deviation noted during stress.  This is an intermediate risk study.  The left ventricular ejection fraction is moderately decreased (30-44%).  This is an intermediate risk study with LV dilatation and no evidence of prior infarct or ischemia. LVEF is 38% with diffuse hypokinesis. 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 Hypertensive response to stress with hypertension 231/132 mmHg. Decreased exercise capacity.   ECHOCARDIOGRAM  ECHOCARDIOGRAM COMPLETE 03/10/2021  Narrative ECHOCARDIOGRAM REPORT    Patient Name:   Amy Chaney Date of Exam: 03/10/2021 Medical Rec #:  [id number]        Height:       69.0 in Accession #:    [id number]       Weight:       209.2 lb Date of Birth:  1971/07/23         BSA:          2.106 m Patient Age:    50 years         BP:           116/70 mmHg Patient Gender: F                HR:           56 bpm. Exam Location:  [address]  Procedure: 2D Echo, 3D Echo, Cardiac Doppler and Color Doppler  Indications:    I50.9 CHF  History:        Patient has prior history of Echocardiogram examinations, most recent 10/31/2019. CHF, CAD and Previous Myocardial Infarction, Signs/Symptoms:Chest Pain and Shortness of Breath; Risk Factors:Hypertension, Diabetes, Dyslipidemia and Current Smoker. Ischemic Cardiomyopathy, Multiple NSTEMI and STEMI, Prior EF 20-25%, Takatsubo Cardiomyopathy.  Sonographer:    Farrel Conners RDCS Referring Phys: [id number]  DAVID W HARDING  IMPRESSIONS   1. Apical akinesis with overall low normal LV function. 2. Left ventricular ejection fraction, by estimation, is 50 to 55%. The left ventricle has low normal function. The left ventricle demonstrates regional wall motion abnormalities (see scoring diagram/findings for description). There is mild left ventricular hypertrophy. Left ventricular diastolic parameters are consistent with Grade II diastolic dysfunction (pseudonormalization). Elevated left atrial pressure. 3. Right ventricular systolic function is normal. The right ventricular size is normal. There is normal pulmonary artery systolic pressure. 4. Left atrial size was moderately dilated. 5. The mitral valve is normal in structure. Mild mitral valve regurgitation. No evidence of mitral stenosis. 6. The aortic valve is tricuspid. Aortic valve regurgitation is not visualized. No aortic stenosis is present. 7. The inferior vena cava is normal in size with greater than 50% respiratory variability, suggesting right atrial pressure of 3 mmHg.  FINDINGS Left Ventricle: Left ventricular ejection fraction, by estimation, is 50 to 55%. The left ventricle has low normal function. The left ventricle demonstrates regional wall motion abnormalities. The left ventricular internal cavity size was normal in size. There is mild left ventricular hypertrophy. Left ventricular diastolic parameters are consistent with Grade II diastolic dysfunction (pseudonormalization). Elevated left atrial pressure.  Right Ventricle: The right ventricular size is normal. Right ventricular systolic function is normal. There is normal pulmonary artery systolic pressure. The tricuspid regurgitant velocity is 2.49 m/s, and with an assumed right atrial pressure of 3 mmHg, the estimated right ventricular systolic pressure is 27.8 mmHg.  Left Atrium: Left atrial size was moderately dilated.  Right Atrium: Right atrial size was normal in  size.  Pericardium: There is no evidence of pericardial effusion.  Mitral Valve:  Cardiology Office Note:  .   Date:  04/13/2023  ID:  Amy Chaney, DOB [birth year], MRN [id number] PCP: Hoy Register, MD  [facility] Providers Cardiologist:  Bryan Lemma, MD    History of Present Illness: .   Amy Chaney is a 52 y.o. female with a hx of CAD, ICM, chronic combined systolic and diastolic heart failure, HTN, HLD, DM2 and chronic tobacco abuse.  Patient had abnormal Myoview in July 2014 and subsequently underwent DES PCI of proximal to mid RCA with 2 overlapping drug-eluting stents by Dr. Jacinto Halim.  She had another NSTEMI in September 2014 after stopping Effient for 4 days, this resulted in 100% subacute stent thrombosis of RCA stent, this was treated with distal overlapping drug-eluting stent.  She had anterior STEMI in March 2019 involve the bifurcation of D2 and LAD that was treated with drug-eluting stent.  She unfortunately had another an anterior STEMI in December 2019 with subacute stent thrombosis treated with DES to distal LAD.  EF was 35 to 40% the time.  Patient was admitted for flash pulmonary edema in May 2020, ejection fraction was 20 to 25% with signs of Takotsubo cardiomyopathy.  This occurred in the setting of her son's death.  She was started on carvedilol, Entresto and spironolactone.  Repeat echocardiogram in July 2020 shows EF has improved to 45 to 50%, moderate LVH, grade 1 DD, severe apical akinesis.  Patient was admitted again in April 2021 due to flash pulmonary edema and acute respiratory failure.  EF was down to 20 to 25% again on echocardiogram.  Left and right heart catheterization performed on 11/04/2019 showed 100% occluded ostial D2, patent stent in the LAD, 50% OM 2 lesion, 40% mid to distal RCA lesion.  Coronary anatomy was relatively stable, it was suspected that her reduced ejection fraction was due to hypertensive urgency.  Patient was admitted in December 2021 due to bilateral pyelonephritis and E. coli bacteremia.  She was discharged on  antibiotic. Repeat echocardiogram obtained on 03/10/2021 showed EF essentially normalized at 50 to 55%, akinesis of the apex, normal RV size, mild MR.   I saw the patient in July 2023 for bubbly sensation in the chest and a dizzy spell.  Her chest discomfort was quite atypical, therefore we recommended continue observation.  A heart monitor was obtained for the palpitation which showed minimal heart rate of 46, maximal heart rate 119, average heart rate 67, predominantly sinus rhythm with 2 SVT, longest only lasted 17 beats.  PVC burden less than 1%.  I last saw the patient in November 2023 at which time she was doing well.  Patient presents today for delayed follow-up.  She has been doing well from the cardiac perspective denying any chest pain or worsening dyspnea.  She is not very active.  She continued to smoke.  Tobacco cessation strongly advised.  She has been feeling down as one of her other son has recently passed away as well.  She still have a daughter and grandkids home she visits often.  EKG today showed downsloping ST segment in lead III that looks little worse compared to the previous EKG, however patient denies any anginal symptom.  Will continue to observe for the time being.  She is aware to contact cardiology service if she does have any exertional symptoms.  Since the recent PCP visit, her hydralazine has been increased to 25 mg twice a day.  On initial arrival today her blood pressure was high at 144/74, however  Cardiology Office Note:  .   Date:  04/13/2023  ID:  Amy Chaney, DOB [birth year], MRN [id number] PCP: Hoy Register, MD  [facility] Providers Cardiologist:  Bryan Lemma, MD    History of Present Illness: .   Amy Chaney is a 52 y.o. female with a hx of CAD, ICM, chronic combined systolic and diastolic heart failure, HTN, HLD, DM2 and chronic tobacco abuse.  Patient had abnormal Myoview in July 2014 and subsequently underwent DES PCI of proximal to mid RCA with 2 overlapping drug-eluting stents by Dr. Jacinto Halim.  She had another NSTEMI in September 2014 after stopping Effient for 4 days, this resulted in 100% subacute stent thrombosis of RCA stent, this was treated with distal overlapping drug-eluting stent.  She had anterior STEMI in March 2019 involve the bifurcation of D2 and LAD that was treated with drug-eluting stent.  She unfortunately had another an anterior STEMI in December 2019 with subacute stent thrombosis treated with DES to distal LAD.  EF was 35 to 40% the time.  Patient was admitted for flash pulmonary edema in May 2020, ejection fraction was 20 to 25% with signs of Takotsubo cardiomyopathy.  This occurred in the setting of her son's death.  She was started on carvedilol, Entresto and spironolactone.  Repeat echocardiogram in July 2020 shows EF has improved to 45 to 50%, moderate LVH, grade 1 DD, severe apical akinesis.  Patient was admitted again in April 2021 due to flash pulmonary edema and acute respiratory failure.  EF was down to 20 to 25% again on echocardiogram.  Left and right heart catheterization performed on 11/04/2019 showed 100% occluded ostial D2, patent stent in the LAD, 50% OM 2 lesion, 40% mid to distal RCA lesion.  Coronary anatomy was relatively stable, it was suspected that her reduced ejection fraction was due to hypertensive urgency.  Patient was admitted in December 2021 due to bilateral pyelonephritis and E. coli bacteremia.  She was discharged on  antibiotic. Repeat echocardiogram obtained on 03/10/2021 showed EF essentially normalized at 50 to 55%, akinesis of the apex, normal RV size, mild MR.   I saw the patient in July 2023 for bubbly sensation in the chest and a dizzy spell.  Her chest discomfort was quite atypical, therefore we recommended continue observation.  A heart monitor was obtained for the palpitation which showed minimal heart rate of 46, maximal heart rate 119, average heart rate 67, predominantly sinus rhythm with 2 SVT, longest only lasted 17 beats.  PVC burden less than 1%.  I last saw the patient in November 2023 at which time she was doing well.  Patient presents today for delayed follow-up.  She has been doing well from the cardiac perspective denying any chest pain or worsening dyspnea.  She is not very active.  She continued to smoke.  Tobacco cessation strongly advised.  She has been feeling down as one of her other son has recently passed away as well.  She still have a daughter and grandkids home she visits often.  EKG today showed downsloping ST segment in lead III that looks little worse compared to the previous EKG, however patient denies any anginal symptom.  Will continue to observe for the time being.  She is aware to contact cardiology service if she does have any exertional symptoms.  Since the recent PCP visit, her hydralazine has been increased to 25 mg twice a day.  On initial arrival today her blood pressure was high at 144/74, however  was no ST segment deviation noted during stress.  This is an intermediate risk study.  The left ventricular ejection fraction is moderately decreased (30-44%).  This is an intermediate risk study with LV dilatation and no evidence of prior infarct or ischemia. LVEF is 38% with diffuse hypokinesis. 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 Hypertensive response to stress with hypertension 231/132 mmHg. Decreased exercise capacity.   ECHOCARDIOGRAM  ECHOCARDIOGRAM COMPLETE 03/10/2021  Narrative ECHOCARDIOGRAM REPORT    Patient Name:   Amy Chaney Date of Exam: 03/10/2021 Medical Rec #:  [id number]        Height:       69.0 in Accession #:    [id number]       Weight:       209.2 lb Date of Birth:  1971/07/23         BSA:          2.106 m Patient Age:    50 years         BP:           116/70 mmHg Patient Gender: F                HR:           56 bpm. Exam Location:  [address]  Procedure: 2D Echo, 3D Echo, Cardiac Doppler and Color Doppler  Indications:    I50.9 CHF  History:        Patient has prior history of Echocardiogram examinations, most recent 10/31/2019. CHF, CAD and Previous Myocardial Infarction, Signs/Symptoms:Chest Pain and Shortness of Breath; Risk Factors:Hypertension, Diabetes, Dyslipidemia and Current Smoker. Ischemic Cardiomyopathy, Multiple NSTEMI and STEMI, Prior EF 20-25%, Takatsubo Cardiomyopathy.  Sonographer:    Farrel Conners RDCS Referring Phys: 59  DAVID W HARDING  IMPRESSIONS   1. Apical akinesis with overall low normal LV function. 2. Left ventricular ejection fraction, by estimation, is 50 to 55%. The left ventricle has low normal function. The left ventricle demonstrates regional wall motion abnormalities (see scoring diagram/findings for description). There is mild left ventricular hypertrophy. Left ventricular diastolic parameters are consistent with Grade II diastolic dysfunction (pseudonormalization). Elevated left atrial pressure. 3. Right ventricular systolic function is normal. The right ventricular size is normal. There is normal pulmonary artery systolic pressure. 4. Left atrial size was moderately dilated. 5. The mitral valve is normal in structure. Mild mitral valve regurgitation. No evidence of mitral stenosis. 6. The aortic valve is tricuspid. Aortic valve regurgitation is not visualized. No aortic stenosis is present. 7. The inferior vena cava is normal in size with greater than 50% respiratory variability, suggesting right atrial pressure of 3 mmHg.  FINDINGS Left Ventricle: Left ventricular ejection fraction, by estimation, is 50 to 55%. The left ventricle has low normal function. The left ventricle demonstrates regional wall motion abnormalities. The left ventricular internal cavity size was normal in size. There is mild left ventricular hypertrophy. Left ventricular diastolic parameters are consistent with Grade II diastolic dysfunction (pseudonormalization). Elevated left atrial pressure.  Right Ventricle: The right ventricular size is normal. Right ventricular systolic function is normal. There is normal pulmonary artery systolic pressure. The tricuspid regurgitant velocity is 2.49 m/s, and with an assumed right atrial pressure of 3 mmHg, the estimated right ventricular systolic pressure is 27.8 mmHg.  Left Atrium: Left atrial size was moderately dilated.  Right Atrium: Right atrial size was normal in  size.  Pericardium: There is no evidence of pericardial effusion.  Mitral Valve:  was no ST segment deviation noted during stress.  This is an intermediate risk study.  The left ventricular ejection fraction is moderately decreased (30-44%).  This is an intermediate risk study with LV dilatation and no evidence of prior infarct or ischemia. LVEF is 38% with diffuse hypokinesis. 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 Hypertensive response to stress with hypertension 231/132 mmHg. Decreased exercise capacity.   ECHOCARDIOGRAM  ECHOCARDIOGRAM COMPLETE 03/10/2021  Narrative ECHOCARDIOGRAM REPORT    Patient Name:   Amy Chaney Date of Exam: 03/10/2021 Medical Rec #:  [id number]        Height:       69.0 in Accession #:    [id number]       Weight:       209.2 lb Date of Birth:  1971/07/23         BSA:          2.106 m Patient Age:    50 years         BP:           116/70 mmHg Patient Gender: F                HR:           56 bpm. Exam Location:  [address]  Procedure: 2D Echo, 3D Echo, Cardiac Doppler and Color Doppler  Indications:    I50.9 CHF  History:        Patient has prior history of Echocardiogram examinations, most recent 10/31/2019. CHF, CAD and Previous Myocardial Infarction, Signs/Symptoms:Chest Pain and Shortness of Breath; Risk Factors:Hypertension, Diabetes, Dyslipidemia and Current Smoker. Ischemic Cardiomyopathy, Multiple NSTEMI and STEMI, Prior EF 20-25%, Takatsubo Cardiomyopathy.  Sonographer:    Farrel Conners RDCS Referring Phys: 59  DAVID W HARDING  IMPRESSIONS   1. Apical akinesis with overall low normal LV function. 2. Left ventricular ejection fraction, by estimation, is 50 to 55%. The left ventricle has low normal function. The left ventricle demonstrates regional wall motion abnormalities (see scoring diagram/findings for description). There is mild left ventricular hypertrophy. Left ventricular diastolic parameters are consistent with Grade II diastolic dysfunction (pseudonormalization). Elevated left atrial pressure. 3. Right ventricular systolic function is normal. The right ventricular size is normal. There is normal pulmonary artery systolic pressure. 4. Left atrial size was moderately dilated. 5. The mitral valve is normal in structure. Mild mitral valve regurgitation. No evidence of mitral stenosis. 6. The aortic valve is tricuspid. Aortic valve regurgitation is not visualized. No aortic stenosis is present. 7. The inferior vena cava is normal in size with greater than 50% respiratory variability, suggesting right atrial pressure of 3 mmHg.  FINDINGS Left Ventricle: Left ventricular ejection fraction, by estimation, is 50 to 55%. The left ventricle has low normal function. The left ventricle demonstrates regional wall motion abnormalities. The left ventricular internal cavity size was normal in size. There is mild left ventricular hypertrophy. Left ventricular diastolic parameters are consistent with Grade II diastolic dysfunction (pseudonormalization). Elevated left atrial pressure.  Right Ventricle: The right ventricular size is normal. Right ventricular systolic function is normal. There is normal pulmonary artery systolic pressure. The tricuspid regurgitant velocity is 2.49 m/s, and with an assumed right atrial pressure of 3 mmHg, the estimated right ventricular systolic pressure is 27.8 mmHg.  Left Atrium: Left atrial size was moderately dilated.  Right Atrium: Right atrial size was normal in  size.  Pericardium: There is no evidence of pericardial effusion.  Mitral Valve:  was no ST segment deviation noted during stress.  This is an intermediate risk study.  The left ventricular ejection fraction is moderately decreased (30-44%).  This is an intermediate risk study with LV dilatation and no evidence of prior infarct or ischemia. LVEF is 38% with diffuse hypokinesis. 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 Hypertensive response to stress with hypertension 231/132 mmHg. Decreased exercise capacity.   ECHOCARDIOGRAM  ECHOCARDIOGRAM COMPLETE 03/10/2021  Narrative ECHOCARDIOGRAM REPORT    Patient Name:   Amy Chaney Date of Exam: 03/10/2021 Medical Rec #:  [id number]        Height:       69.0 in Accession #:    [id number]       Weight:       209.2 lb Date of Birth:  1971/07/23         BSA:          2.106 m Patient Age:    50 years         BP:           116/70 mmHg Patient Gender: F                HR:           56 bpm. Exam Location:  [address]  Procedure: 2D Echo, 3D Echo, Cardiac Doppler and Color Doppler  Indications:    I50.9 CHF  History:        Patient has prior history of Echocardiogram examinations, most recent 10/31/2019. CHF, CAD and Previous Myocardial Infarction, Signs/Symptoms:Chest Pain and Shortness of Breath; Risk Factors:Hypertension, Diabetes, Dyslipidemia and Current Smoker. Ischemic Cardiomyopathy, Multiple NSTEMI and STEMI, Prior EF 20-25%, Takatsubo Cardiomyopathy.  Sonographer:    Farrel Conners RDCS Referring Phys: 59  DAVID W HARDING  IMPRESSIONS   1. Apical akinesis with overall low normal LV function. 2. Left ventricular ejection fraction, by estimation, is 50 to 55%. The left ventricle has low normal function. The left ventricle demonstrates regional wall motion abnormalities (see scoring diagram/findings for description). There is mild left ventricular hypertrophy. Left ventricular diastolic parameters are consistent with Grade II diastolic dysfunction (pseudonormalization). Elevated left atrial pressure. 3. Right ventricular systolic function is normal. The right ventricular size is normal. There is normal pulmonary artery systolic pressure. 4. Left atrial size was moderately dilated. 5. The mitral valve is normal in structure. Mild mitral valve regurgitation. No evidence of mitral stenosis. 6. The aortic valve is tricuspid. Aortic valve regurgitation is not visualized. No aortic stenosis is present. 7. The inferior vena cava is normal in size with greater than 50% respiratory variability, suggesting right atrial pressure of 3 mmHg.  FINDINGS Left Ventricle: Left ventricular ejection fraction, by estimation, is 50 to 55%. The left ventricle has low normal function. The left ventricle demonstrates regional wall motion abnormalities. The left ventricular internal cavity size was normal in size. There is mild left ventricular hypertrophy. Left ventricular diastolic parameters are consistent with Grade II diastolic dysfunction (pseudonormalization). Elevated left atrial pressure.  Right Ventricle: The right ventricular size is normal. Right ventricular systolic function is normal. There is normal pulmonary artery systolic pressure. The tricuspid regurgitant velocity is 2.49 m/s, and with an assumed right atrial pressure of 3 mmHg, the estimated right ventricular systolic pressure is 27.8 mmHg.  Left Atrium: Left atrial size was moderately dilated.  Right Atrium: Right atrial size was normal in  size.  Pericardium: There is no evidence of pericardial effusion.  Mitral Valve:

## 2023-04-14 LAB — COMPREHENSIVE METABOLIC PANEL
ALT: 10 IU/L (ref 0–32)
AST: 15 IU/L (ref 0–40)
Albumin: 4 g/dL (ref 3.8–4.9)
Alkaline Phosphatase: 45 IU/L (ref 44–121)
BUN/Creatinine Ratio: 18 (ref 9–23)
BUN: 20 mg/dL (ref 6–24)
Bilirubin Total: 0.4 mg/dL (ref 0.0–1.2)
CO2: 22 mmol/L (ref 20–29)
Calcium: 9.2 mg/dL (ref 8.7–10.2)
Chloride: 102 mmol/L (ref 96–106)
Creatinine, Ser: 1.13 mg/dL — ABNORMAL HIGH (ref 0.57–1.00)
Globulin, Total: 3.1 g/dL (ref 1.5–4.5)
Glucose: 158 mg/dL — ABNORMAL HIGH (ref 70–99)
Potassium: 4 mmol/L (ref 3.5–5.2)
Sodium: 139 mmol/L (ref 134–144)
Total Protein: 7.1 g/dL (ref 6.0–8.5)
eGFR: 58 mL/min/{1.73_m2} — ABNORMAL LOW (ref >59)

## 2023-04-14 LAB — CBC
Hematocrit: 37.2 % (ref 34.0–46.6)
Hemoglobin: 11.9 g/dL (ref 11.1–15.9)
MCH: 27 pg (ref 26.6–33.0)
MCHC: 32 g/dL (ref 31.5–35.7)
MCV: 85 fL (ref 79–97)
Platelets: 361 10*3/uL (ref 150–450)
RBC: 4.4 x10E6/uL (ref 3.77–5.28)
RDW: 13.4 % (ref 11.7–15.4)
WBC: 11.2 10*3/uL — ABNORMAL HIGH (ref 3.4–10.8)

## 2023-04-14 LAB — LIPID PANEL
Chol/HDL Ratio: 2.6 ratio (ref 0.0–4.4)
Cholesterol, Total: 113 mg/dL (ref 100–199)
HDL: 44 mg/dL (ref >39)
LDL Chol Calc (NIH): 51 mg/dL (ref 0–99)
Triglycerides: 97 mg/dL (ref 0–149)
VLDL Cholesterol Cal: 18 mg/dL (ref 5–40)

## 2023-04-18 ENCOUNTER — Other Ambulatory Visit: Payer: 59

## 2023-04-18 NOTE — Patient Instructions (Signed)
Visit Information  The Patient                                              was given information about Medicaid Managed Care team care coordination services and consented to engagement with the [facility] Managed Care team.   Social Worker will follow up on 05/02/23.   Abelino Derrick, MHA [facility]  Managed [facility] Social Worker [phone]

## 2023-04-18 NOTE — Patient Outreach (Signed)
Medicaid Managed Care Social Work Note  04/18/2023 Name:  Amy Chaney MRN:  [id number] DOB:  06-23-1971  Amy Chaney is an 52 y.o. year old female who is a primary patient of Hoy Register, MD.  The Medicaid Managed Care Coordination team was consulted for assistance with:  Community Resources   Ms. Gerstle was given information about Medicaid Managed Care Coordination team services today. [name] Patient agreed to services and verbal consent obtained.  Engaged with patient  for by telephone forinitial visit in response to referral for case management and/or care coordination services.   Assessments/Interventions:  Review of past medical history, allergies, medications, health status, including review of consultants reports, laboratory and other test data, was performed as part of comprehensive evaluation and provision of chronic care management services.  SDOH: (Social Determinant of Health) assessments and interventions performed: SDOH Interventions    Flowsheet Row Telephone from 04/03/2023 in [facility] Office Visit from 12/12/2022 in [facility] Office Visit from 11/07/2022 in [facility] Telephone from 02/13/2022 in [facility] Office Visit from 11/16/2021 in [facility] Office Visit from 03/03/2015 in [facility]  SDOH Interventions        Food Insecurity Interventions -- -- -- Other (Comment)  [mailed [employer] FNS card] -- --  Housing Interventions AMB Referral  [referral sent to [facility] ACo Care Management. Referral also placed to [employer] requesting assistance with contacting the housing authority to transfer to 1st floor housing. Request sent to [facility] request funds for September rent payment.] Intervention Not Indicated Intervention Not Indicated Other  (Comment)  [pt mentioned paying on lights bill, no turn off notice, encouraged to let me know if this remains an issue (possibly Patient Care Fund)] -- --  Transportation Interventions -- Intervention Not Indicated Intervention Not Indicated Intervention Not Indicated -- --  Utilities Interventions AMB Referral  [referred to [facility] ACO Care management for assistance navigating insurance benefits that may assist with rent/ utilitiy payment.  She said he electric bill is about $500] Intervention Not Indicated Intervention Not Indicated -- -- --  Alcohol Usage Interventions -- Intervention Not Indicated (Score <7) Intervention Not Indicated (Score <7) -- -- --  Depression Interventions/Treatment  -- -- -- -- Medication Medication  Financial Strain Interventions Other (Comment)  [referred to [facility] ACO Care Management.  request made to [facility] Patient Assistance Fund requestin help with September rent] Other (Comment)  [No current barriers to accessing pharmacy needs] Other (Comment)  [Refills sent to our pharmacy. Coordinating PA requests through our pharmacy.] --  [FNS card [employer], referral to Financial Counselor re: CAFA, possible Patient Care Fund assistance] -- --  Physical Activity Interventions -- Other (Comments)  [Counseled on 150 minutes/week of aerobic exercise] Other (Comments)  [Counseled on 150 minutes/week of aerobic exercise] -- -- --  Stress Interventions -- Intervention Not Indicated Intervention Not Indicated -- -- --  Social Connections Interventions -- Intervention Not Indicated Intervention Not Indicated -- -- --     BSW completed a telephone outreach with patient, she states she is unsure if she needs any resources right now, [employer] paid her rent and she was able to get assistance with her utility bill. Her son recently passed away and was paying half of the rent. She has asked her apartment complex to adjust  the rent to her income and is waiting for a decision to  be made. BSW will follow up with patient on 10/2.   Advanced Directives Status:  Not addressed in this encounter.  Care Plan                 Allergies  Allergen Reactions   Farxiga [Dapagliflozin] Other (See Comments)    Caused UTI and polyps on kidneys     Medications Reviewed Today   Medications were not reviewed in this encounter     Patient Active Problem List   Diagnosis Date Noted   Cardiomyopathy, ischemic 02/14/2021   Acute pyelonephritis 07/21/2020   Pyelonephritis 07/20/2020   Trichomoniasis 03/30/2019   Mild dysplasia of cervix (CIN I) 02/04/2019   Screening breast examination 01/16/2019   Pulmonary edema 12/18/2018   Atypical squamous cell changes of undetermined significance (ASCUS) on cervical cytology with positive high risk human papilloma virus (HPV) 11/21/2018   Dyslipidemia, goal LDL below 70    Acute ST elevation myocardial infarction (STEMI) involving left anterior descending (LAD) coronary artery (HCC) 07/03/2018   Dark stools 04/03/2018   Hypertensive emergency 12/30/2017   STEMI involving left anterior descending coronary artery (HCC) 10/10/2017   MI, acute, non ST segment elevation (HCC)    Carotid artery disease (HCC) 01/11/2016   Prolonged Q-T interval on ECG 01/11/2016   Tobacco abuse counseling 01/11/2016   Heart palpitations 01/11/2016   Tobacco abuse 01/11/2016   Morbid obesity (HCC): BMI ~36 with HTN, HLD, CAD 11/25/2015   Smoker 03/03/2015   Coronary artery disease involving native coronary artery of native heart with angina pectoris (HCC) 12/09/2014   Chronic combined systolic (congestive) and diastolic (congestive) heart failure (HCC) 12/09/2014   H/O noncompliance with medical treatment, presenting hazards to health 12/09/2014   S/P primary angioplasty with coronary stent 11/18/2014   Diabetes type 2, uncontrolled 04/01/2013   Hyperlipidemia associated with type 2 diabetes mellitus (HCC) 04/01/2013   Depression 04/01/2013   Stress at  home 04/01/2013   Hypertensive heart disease with chronic combined systolic and diastolic congestive heart failure (HCC) 09/05/2011   Type II diabetes mellitus (HCC) 12/30/2010    Conditions to be addressed/monitored per PCP order:   community resources  There are no care plans that you recently modified to display for this patient.   Follow up:  Patient agrees to Care Plan and Follow-up.  Plan: The Managed Medicaid care management team will reach out to the patient again over the next 14 days.  Date/time of next scheduled Social Work care management/care coordination outreach:  05/02/23  Gus Puma, Kenard Gower, [facility]  Managed [facility] Social Worker [phone]

## 2023-04-19 ENCOUNTER — Telehealth: Payer: Self-pay

## 2023-04-19 NOTE — Telephone Encounter (Addendum)
Called patient regarding results, patient had understanding of results.  ----- Message from Azalee Course sent at 04/16/2023  2:30 PM EDT ----- Stable renal function and electrolyte. Normal liver enzyme. Normal red blood cell count. White blood cell count near upper border of normal.

## 2023-04-26 ENCOUNTER — Other Ambulatory Visit: Payer: Self-pay

## 2023-04-26 ENCOUNTER — Ambulatory Visit
Admission: RE | Admit: 2023-04-26 | Discharge: 2023-04-26 | Disposition: A | Discharge status: Home / Self Care | Payer: 59 | Source: Ambulatory Visit | Attending: Family Medicine | Admitting: Family Medicine

## 2023-04-26 DIAGNOSIS — R921 Mammographic calcification found on diagnostic imaging of breast: Secondary | ICD-10-CM | POA: Diagnosis not present

## 2023-04-26 DIAGNOSIS — R928 Other abnormal and inconclusive findings on diagnostic imaging of breast: Secondary | ICD-10-CM

## 2023-04-27 ENCOUNTER — Other Ambulatory Visit: Payer: Self-pay

## 2023-04-30 NOTE — Telephone Encounter (Signed)
I spoke to Jamie/[employer] and she confirmed that they received the check from [facility] for patient's rent.

## 2023-05-02 ENCOUNTER — Other Ambulatory Visit: Payer: 59

## 2023-05-02 NOTE — Patient Instructions (Signed)
Medicaid Managed Care   Unsuccessful Outreach Note  05/02/2023 Name: Amy Chaney MRN: [id number] DOB: 11-23-1970  Referred by: Hoy Register, MD Reason for referral : High Risk Managed Medicaid (MM Social work unsuccessful telephone outreach )   An unsuccessful telephone outreach was attempted today. The patient was referred to the case management team for assistance with care management and care coordination.   Follow Up Plan: A HIPAA compliant phone message was left for the patient providing contact information and requesting a return call.   Abelino Derrick, MHA [facility]  Managed [facility] Social Worker [phone]

## 2023-05-02 NOTE — Patient Outreach (Signed)
Medicaid Managed Care   Unsuccessful Outreach Note  05/02/2023 Name: Amy Chaney MRN: [id number] DOB: 11-23-1970  Referred by: Hoy Register, MD Reason for referral : High Risk Managed Medicaid (MM Social work unsuccessful telephone outreach )   An unsuccessful telephone outreach was attempted today. The patient was referred to the case management team for assistance with care management and care coordination.   Follow Up Plan: A HIPAA compliant phone message was left for the patient providing contact information and requesting a return call.   Abelino Derrick, MHA [facility]  Managed [facility] Social Worker [phone]

## 2023-05-10 ENCOUNTER — Other Ambulatory Visit: Payer: Self-pay | Admitting: Family Medicine

## 2023-05-10 ENCOUNTER — Other Ambulatory Visit: Payer: Self-pay

## 2023-05-10 DIAGNOSIS — I10 Essential (primary) hypertension: Secondary | ICD-10-CM

## 2023-05-10 DIAGNOSIS — I25110 Atherosclerotic heart disease of native coronary artery with unstable angina pectoris: Secondary | ICD-10-CM

## 2023-05-10 MED ORDER — ROSUVASTATIN CALCIUM 40 MG PO TABS
40.0000 mg | ORAL_TABLET | Freq: Every day | ORAL | 1 refills | Status: DC
  Filled 2023-05-10: qty 30, 30d supply, fill #0
  Filled 2023-06-13: qty 30, 30d supply, fill #1
  Filled 2023-07-13: qty 30, 30d supply, fill #2
  Filled 2023-07-31 – 2023-08-22 (×2): qty 30, 30d supply, fill #3
  Filled 2023-09-26: qty 30, 30d supply, fill #4
  Filled 2023-10-26: qty 30, 30d supply, fill #5

## 2023-05-10 MED ORDER — CARVEDILOL 25 MG PO TABS
25.0000 mg | ORAL_TABLET | Freq: Two times a day (BID) | ORAL | 1 refills | Status: DC
Start: 2023-05-10 — End: 2024-01-30
  Filled 2023-05-10: qty 60, 30d supply, fill #0
  Filled 2023-06-29: qty 60, 30d supply, fill #1
  Filled 2023-09-26: qty 60, 30d supply, fill #2
  Filled 2023-10-26: qty 60, 30d supply, fill #3
  Filled 2023-11-28: qty 60, 30d supply, fill #4

## 2023-05-21 ENCOUNTER — Other Ambulatory Visit: Payer: Self-pay

## 2023-05-21 NOTE — Patient Outreach (Signed)
Medicaid Managed Care   Unsuccessful Outreach Note  05/21/2023 Name: Amy Chaney MRN: [id number] DOB: 12-15-1970  Referred by: Hoy Register, MD Reason for referral : High Risk Managed Medicaid (MM social work unsuccessful telephone outreach )   A second unsuccessful telephone outreach was attempted today. The patient was referred to the case management team for assistance with care management and care coordination.   Follow Up Plan: A HIPAA compliant phone message was left for the patient providing contact information and requesting a return call.   Abelino Derrick, MHA [facility]  Managed [facility] Social Worker [phone]

## 2023-05-21 NOTE — Patient Instructions (Signed)
Medicaid Managed Care   Unsuccessful Outreach Note  05/21/2023 Name: Amy Chaney MRN: [id number] DOB: 12-15-1970  Referred by: Hoy Register, MD Reason for referral : High Risk Managed Medicaid (MM social work unsuccessful telephone outreach )   A second unsuccessful telephone outreach was attempted today. The patient was referred to the case management team for assistance with care management and care coordination.   Follow Up Plan: A HIPAA compliant phone message was left for the patient providing contact information and requesting a return call.   Abelino Derrick, MHA [facility]  Managed [facility] Social Worker [phone]

## 2023-05-23 ENCOUNTER — Encounter: Payer: Self-pay | Admitting: Internal Medicine

## 2023-05-23 ENCOUNTER — Other Ambulatory Visit: Payer: Self-pay

## 2023-05-23 ENCOUNTER — Telehealth: Payer: Self-pay

## 2023-05-23 ENCOUNTER — Ambulatory Visit (INDEPENDENT_AMBULATORY_CARE_PROVIDER_SITE_OTHER): Payer: 59 | Admitting: Internal Medicine

## 2023-05-23 VITALS — BP 130/80 | HR 76 | Ht 69.0 in | Wt 217.0 lb

## 2023-05-23 DIAGNOSIS — Z794 Long term (current) use of insulin: Secondary | ICD-10-CM

## 2023-05-23 DIAGNOSIS — Z789 Other specified health status: Secondary | ICD-10-CM

## 2023-05-23 DIAGNOSIS — Z1211 Encounter for screening for malignant neoplasm of colon: Secondary | ICD-10-CM

## 2023-05-23 DIAGNOSIS — E119 Type 2 diabetes mellitus without complications: Secondary | ICD-10-CM | POA: Diagnosis not present

## 2023-05-23 DIAGNOSIS — Z955 Presence of coronary angioplasty implant and graft: Secondary | ICD-10-CM

## 2023-05-23 MED ORDER — PLENVU 140 G PO SOLR
1.0000 | Freq: Once | ORAL | 0 refills | Status: AC
  Filled 2023-05-23: qty 3, 1d supply, fill #0

## 2023-05-23 NOTE — Progress Notes (Signed)
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  She is sent today by her primary care provider regarding the need for screening colonoscopy.  Patient tells me that she does have occasional constipation.  This is stable.  No rectal bleeding.  No family history of colon cancer.  No prior colorectal neoplasia screening.  Review of blood work from April 13, 2023 shows a hemoglobin of 11.9, creatinine 1.13.  Last hemoglobin A1c June 2024 was 7.9.  Her last ejection fraction on echo was August 2022 at 50 to 55%.  She sees Dr. Herbie Baltimore for her cardiology needs.  CT scan of the abdomen and pelvis with contrast December 2021 to evaluate nausea and vomiting revealed changes suggesting pyelonephritis.  Otherwise negative.  She is status post cholecystectomy  REVIEW OF SYSTEMS:  All non-GI ROS negative except for  Past Medical History:  Diagnosis Date   Coronary artery disease involving native coronary artery of native heart with angina pectoris (HCC) 01/2013   a. s/p PCI of the mid RCA 01/2013 //  b. LHC 9/14: EF 55%, RCA stent 100% occluded, LAD irregularities >>  subacute stent thrombosis, Promus DES PCI distal overlap // c) 09/2017 - Ant STEMI - LAD-D2 PCI - Successful PCI of LAD (3 overlapping DES), unable to restore flow down D2l that was stented as well.  Likely related to downstream dissection, but unable to rewire.   Daily headache    Depression    Dyslipidemia, goal LDL below 70    History of Doppler ultrasound    carotid bruit >> a. Carotid US 6/17: bilat ICA 1-39%   History of Takotsubo cardiomyopathy 11/2018   Admitted for acute on chronic combined systolic  and diastolic heart failure.  EF down to 20-25% global hypokinesis.  Was in the setting of grieving over the loss of her son from MI.  ->  EF improved to 45-50% on follow-up echocardiogram.   Hypertension    Ischemic cardiomyopathy 06/2018   s/p inferior STEMI followed by 2 anterior STEMIs: Following recovery from Takotsubo Cardiomyopathy- Echo 02/14/19: Moderate LVH.  EF improved up to 45-50%.  GR 1 DD.  Severe apical akinesis.  Normal valves.   Mild dysplasia of cervix (CIN I) 02/04/2019   Seen after colposcopy on 01/23/19 done for ASCU +HPV pap  > repeat pap and HPV test in 12 and 24 months   STEMI involving left anterior descending coronary artery (HCC) 09/2017; 06/2018   a) Severe Medina 1, 1, 1 LAD-D2 lesion (complicated by post PTCA dissection/intramural hematoma) -successful extensive PCI of the LAD but unable to maintain patency of the stented D2. b) distal mLAD stent Edge 100% thrombosis --> PTCA & Overlapping DES PCI (Synergy 3 x 12 --> 3.6-3.3 mm). D2 occluded. RCA stents patent, 65 % OM2. EF 30-35%.   STEMI involving oth coronary artery of inferior wall (HCC) 03/2013   Secondary to subacute stent thrombosis of RCA stent segment having missed 4 doses of Effient.   Tobacco abuse 01/11/2016   Type II diabetes mellitus (HCC) 12/2010   sister and son also diabetic     Past Surgical History:  Procedure Laterality Date   CHOLECYSTECTOMY  ~ 2000   CORONARY STENT INTERVENTION N/A  10/10/2017   Procedure: CORONARY STENT INTERVENTION;  Surgeon: Marykay Lex, MD;  Location[facility] INVASIVE CV LAB;  Service: Cardiovascular; LAD PCI: STENT SYNERGY DES 3.5X32 (p),STENT SYNERGY DES 3.5X 8 (m), STENT SYNERGY DES 3.5X28 (d).  D2 PCI: STENT SYNERGY DES 2.5X24.  (UNABL TO REWIRE & EXPAND - TO OF DIAG AT END OF CASE)   CORONARY STENT INTERVENTION N/A 07/03/2018   Procedure: CORONARY STENT INTERVENTION;  Surgeon: Marykay Lex, MD;  Location: [facility] INVASIVE CV LAB;; PTCA & overlapping DES PCI (overlaps prior  stents distally) - Synergy DES 3 x 12 (3.6 mm @ overlap -- 3.3 mm distally)>   LEFT HEART CATH AND CORONARY ANGIOGRAPHY N/A 10/10/2017   Procedure: LEFT HEART CATH AND CORONARY ANGIOGRAPHY;  Surgeon: Marykay Lex, MD;  Location: [facility] INVASIVE CV LAB;  Service: Cardiovascular;  Laterality: N/A; -patent RCA stents with mild in-stent restenosis. Medina 1,1,1 mLD-D2 90% (complicated by dissection/intramural thrombus)   LEFT HEART CATH AND CORONARY ANGIOGRAPHY N/A 07/03/2018   Procedure: LEFT HEART CATH AND CORONARY ANGIOGRAPHY;  Surgeon: Marykay Lex, MD;  Location: [facility] INVASIVE CV LAB;; 100% thrombotic occlusion distal LAD stent -- DES PCI. continued 100% D2 (previously lost). patent RCA stents, 65% OM2.  EF 30-35%.   LEFT HEART CATHETERIZATION WITH CORONARY ANGIOGRAM N/A 02/25/2013   Procedure: LEFT HEART CATHETERIZATION WITH CORONARY ANGIOGRAM;  Surgeon: Pamella Pert, MD;  Location[facility] CATH LAB;  Service: Cardiovascular;; CTO m RCA; otherwise normal coronaries   LEFT HEART CATHETERIZATION WITH CORONARY ANGIOGRAM N/A 04/01/2013   Procedure: LEFT HEART CATHETERIZATION WITH CORONARY ANGIOGRAM;  Surgeon: Pamella Pert, MD;  Location: [facility] CATH LAB;  Service: Cardiovascular: 100% occlusion of distal RCA stent (subacute stent thrombosis -  secondary to stopping Effient).  Overlapping DES PCI   NM MYOVIEW LTD  12/2015    EF 38%.  Hypertensive response to exercise (231/132 mmHg).  INTERMEDIATE RISK due to -diffuse hypokinesis and reduced EF.  No ischemia or infarction.   PERCUTANEOUS CORONARY STENT INTERVENTION (PCI-S)  04/01/2013   Procedure: PERCUTANEOUS CORONARY STENT INTERVENTION (PCI-S);  Surgeon: Pamella Pert, MD;  Location: [facility] CATH LAB;  Service: Cardiovascular;; PCI of distal RCA stent subacute thrombosis: Promus Premier DES 3.0 mm x 20 mm.   PERCUTANEOUS CORONARY STENT INTERVENTION (PCI-S)  02/25/2013   Procedure: PERCUTANEOUS CORONARY STENT INTERVENTION (PCI-S);  Surgeon: Pamella Pert,  MD;  Location: [facility] CATH LAB;  RCA CTO PCI with overlapping Promus DES: 3.0 mm x 38 mm, 3.5 mm x 16mm   RIGHT/LEFT HEART CATH AND CORONARY ANGIOGRAPHY N/A 11/04/2019   Procedure: RIGHT/LEFT HEART CATH AND CORONARY ANGIOGRAPHY;  Surgeon: Marykay Lex, MD;  Location: [facility] INVASIVE CV LAB;; Mildly elevated LVEDP.  Ostial D1 100%.  Proximal to mid LAD stent 10% ISR.  Mid and distal stent widely patent.  30% distal distance.  Ostial OM 2 55%.  Proximal to mid RCA stent 50% stenosis.  Mid to distal RCA 40%.  Suspect that reduced EF is related to hypertensive emergen   TRANSTHORACIC ECHOCARDIOGRAM  07/04/2018   recurrent Anterior STEMI: EF 35-40%.  Apical hypokinesis.  GRII DD.  No thrombus noted.  (Improved EF from 30% up to 35-40% from previous echo)   TRANSTHORACIC ECHOCARDIOGRAM  11/2018; 01/2019   a) TAKOTUSBO CM: EF 20-25%-diffuse HK..  Mod V thickness.  GRII DD w/ high LVEDP.  Severe LA dilation;; b) 02/14/19: Moderate LVH.  EF improved up to 45-50%.  GR 1 DD.  Severe apical akinesis.  Normal  valves.   TRANSTHORACIC ECHOCARDIOGRAM  10/31/2019   Hypertensive emergency, hypoxic/hypercapnic respiratory failure;  EF 20 to 25%.  Global HK.  Mildly dilated LV.  Mildly elevated PA pressures.  Mild LA dilation.  Mild AR.  Mildly elevated RAP-8 mmHg.=> Eventual cath showed no change   TUBAL LIGATION  1994    Social History LAVAYA SIT  reports that she has been smoking cigarettes. She has a 5.5 pack-year smoking history. She has never used smokeless tobacco. She reports current alcohol use. She reports current drug use. Drug: Marijuana.  family history includes Diabetes in her mother, sister, sister, and son.  Allergies  Allergen Reactions   Farxiga [Dapagliflozin] Other (See Comments)    Caused UTI and polyps on kidneys        PHYSICAL EXAMINATION: Vital signs: BP 130/80   Pulse 76   Ht 5\' 9"  (1.753 m)   Wt 217 lb (98.4 kg)   LMP 10/20/2015   BMI 32.05 kg/m   Constitutional: Pleasant,  unhealthy appearing, no acute distress.  Smells of cigarette smoke Psychiatric: alert and oriented x3, cooperative Eyes: extraocular movements intact, anicteric, conjunctiva pink Mouth: oral pharynx moist, no lesions Neck: supple no lymphadenopathy Cardiovascular: heart regular rate and rhythm, no murmur Lungs: clear to auscultation bilaterally Abdomen: soft, obese, nontender, nondistended, no obvious ascites, no peritoneal signs, normal bowel sounds, no organomegaly Rectal: Deferred until colonoscopy Extremities: no clubbing, or cyanosis.  Trace lower extremity edema bilaterally Skin: no lesions on visible extremities Neuro: No focal deficits.  Cranial nerves intact  ASSESSMENT:  1.  Colon cancer screening.  Average risk for neoplasia.  High risk for invasive procedure due to comorbidities and body habitus. 2.  Insulin requiring diabetes mellitus 3.  History of coronary artery disease with remote stent placement on Plavix 4.  Morbid obesity 5.  Ongoing tobacco abuse   PLAN:  1.  Screening colonoscopy.  She is high risk due to her comorbidities, the need to address multiple medications, ongoing smoking, and body habitus.The nature of the procedure, as well as the risks, benefits, and alternatives were carefully and thoroughly reviewed with the patient. Ample time for discussion and questions allowed. The patient understood, was satisfied, and agreed to proceed. 2.  Hold Plavix 5 days prior to the procedure.  We will discuss with her cardiologist Dr. Herbie Baltimore 3.  Hold Trulicity for at least 1 week prior to the procedure.  She understands 4.  Adjust diabetic medications to avoid unwanted hypoglycemia 5.  Stop smoking 6.  Weight loss A total time of 60 minutes was spent preparing to see the patient, reviewing laboratories and x-rays as well as cardiovascular studies, obtaining comprehensive history, performing medically appropriate physical examination, counseling and educating the patient  regarding the above listed issues, adjusting medications for the procedure, ordering colonoscopy, and documenting clinical information in the health record

## 2023-05-23 NOTE — Telephone Encounter (Signed)
     Primary Cardiologist: Bryan Lemma, MD  Chart reviewed as part of pre-operative protocol coverage. Given past medical history and time since last visit, based on ACC/AHA guidelines, Amy Chaney would be at acceptable risk for the planned procedure without further cardiovascular testing.   Her Plavix may be held for 5 days prior to her procedure.  Please resume as soon as hemostasis is achieved.  I will route this recommendation to the requesting party via Epic fax function and remove from pre-op pool.  Please call with questions.  Thomasene Ripple. Kerman Pfost NP-C     05/23/2023, 1:06 PM [facility] [address] Office [phone] Fax [phone]

## 2023-05-23 NOTE — Patient Instructions (Signed)
You have been scheduled for a colonoscopy. Please follow written instructions given to you at your visit today.   Please pick up your prep supplies at the pharmacy within the next 1-3 days.  If you use inhalers (even only as needed), please bring them with you on the day of your procedure.  DO NOT TAKE 14 DAYS PRIOR TO TEST- Trulicity (dulaglutide) Ozempic, Wegovy (semaglutide) Mounjaro (tirzepatide) Bydureon Bcise (exanatide extended release)  DO NOT TAKE 1 DAY PRIOR TO YOUR TEST Rybelsus (semaglutide) Adlyxin (lixisenatide) Victoza (liraglutide) Byetta (exanatide) ___________________________________________________________________________  Bonita Quin will be contacted by our office prior to your procedure for directions on holding your Plavix.  If you do not hear from our office 1 week prior to your scheduled procedure, please call [phone] to discuss.  _______________________________________________________  If your blood pressure at your visit was 140/90 or greater, please contact your primary care physician to follow up on this.  _______________________________________________________  If you are age 51 or older, your body mass index should be between 23-30. Your Body mass index is 32.05 kg/m. If this is out of the aforementioned range listed, please consider follow up with your Primary Care Provider.  If you are age 11 or younger, your body mass index should be between 19-25. Your Body mass index is 32.05 kg/m. If this is out of the aformentioned range listed, please consider follow up with your Primary Care Provider.   ________________________________________________________  The [place] [facility] providers would like to encourage you to use [facility] to communicate with providers for non-urgent requests or questions.  Due to long hold times on the telephone, sending your provider a message by [facility] may be a faster and more efficient way to get a response.  Please allow 48 business  hours for a response.  Please remember that this is for non-urgent requests.  _______________________________________________________

## 2023-05-23 NOTE — Telephone Encounter (Signed)
[facility] Pre-operative Risk Assessment     Request for surgical clearance:     Endoscopy Procedure  What type of surgery is being performed?     colonoscopy  When is this surgery scheduled?     06/15/2023  What type of clearance is required ?   Pharmacy  Are there any medications that need to be held prior to surgery and how long? Plavix - 5 days  Practice name and name of physician performing surgery?      [facility]  What is your office phone and fax number?      Phone- [phone]  Fax- [phone]  Anesthesia type (None, local, MAC, general) ?       MAC

## 2023-05-24 ENCOUNTER — Telehealth: Payer: Self-pay

## 2023-05-24 ENCOUNTER — Other Ambulatory Visit: Payer: Self-pay

## 2023-05-24 NOTE — Telephone Encounter (Signed)
-----   Message from Yancey Flemings sent at 05/24/2023  9:01 AM EDT ----- Thanks. Verlon Au, see below. ----- Message ----- From: Marykay Lex, MD Sent: 05/23/2023   6:41 PM EDT To: Hilarie Fredrickson, MD  That would be fine.  Bryan Lemma, MD ----- Message ----- From: Hilarie Fredrickson, MD Sent: 05/23/2023  12:56 PM EDT To: Marykay Lex, MD  Onalee Hua Ms. [place] scheduled for colonoscopy.  We were wanting to hold her Plavix 5 days prior to the procedure and resume thereafter soon as possible.  Just checking to see if this would be medically reasonable. Thanks, Jonny Ruiz

## 2023-05-24 NOTE — Telephone Encounter (Signed)
Lm on vm that patient could hold Plavix for 5 days prior to procedure.  Asked that she call me back to let me know she got this information

## 2023-05-24 NOTE — Telephone Encounter (Signed)
Patient knows to hold plavix

## 2023-05-24 NOTE — Telephone Encounter (Signed)
Inbound call from patient stating she received instructions on holding plavix.

## 2023-05-28 ENCOUNTER — Telehealth: Payer: Self-pay

## 2023-05-28 NOTE — Telephone Encounter (Signed)
..  Medicaid Managed Care   Unsuccessful Outreach Note  05/28/2023 Name: Amy Chaney MRN: [id number] DOB: 1970/09/10  Referred by: Hoy Register, MD Reason for referral : Appointment (I called the patient today to get her missed phone appointment with the MM BSW rescheduled. She did not answer but I was able to leave a message on her VM.)   Third unsuccessful telephone outreach was attempted today. The patient was referred to the case management team for assistance with care management and care coordination. The patient's primary care provider has been notified of our unsuccessful attempts to make or maintain contact with the patient. The care management team is pleased to engage with this patient at any time in the future should he/she be interested in assistance from the care management team.   Follow Up Plan: We have been unable to make contact with the patient for follow up. The care management team is available to follow up with the patient after provider conversation with the patient regarding recommendation for care management engagement and subsequent re-referral to the care management team.   Weston Settle Care Guide  [facility] Managed  Care Guide [facility]  [phone]

## 2023-05-29 ENCOUNTER — Other Ambulatory Visit: Payer: Self-pay

## 2023-05-29 ENCOUNTER — Other Ambulatory Visit (HOSPITAL_COMMUNITY): Payer: Self-pay

## 2023-05-29 ENCOUNTER — Telehealth: Payer: Self-pay | Admitting: Cardiology

## 2023-05-29 MED ORDER — ENTRESTO 97-103 MG PO TABS
1.0000 | ORAL_TABLET | Freq: Two times a day (BID) | ORAL | 2 refills | Status: DC
Start: 2023-05-29 — End: 2024-03-07
  Filled 2023-05-29: qty 60, 30d supply, fill #0
  Filled 2023-06-29: qty 60, 30d supply, fill #1
  Filled 2023-07-31: qty 60, 30d supply, fill #2
  Filled 2023-08-30: qty 60, 30d supply, fill #3
  Filled 2023-09-26: qty 60, 30d supply, fill #4
  Filled 2023-10-26: qty 60, 30d supply, fill #5
  Filled 2023-11-27: qty 60, 30d supply, fill #6
  Filled 2024-01-04: qty 60, 30d supply, fill #7
  Filled 2024-02-05: qty 60, 30d supply, fill #8

## 2023-05-29 NOTE — Telephone Encounter (Signed)
Pt c/o medication issue:  1. Name of Medication: sacubitril-valsartan (ENTRESTO) 97-103 MG   2. How are you currently taking this medication (dosage and times per day) Take 1 tablet by mouth 2 (two) times daily.  3. Are you having a reaction (difficulty breathing--STAT)? no  4. What is your medication issue? Patient only has 6 pills left. Pt is requesting samples because she needs to renew patient assistance. She also needs help with application.

## 2023-05-29 NOTE — Telephone Encounter (Signed)
New prescription sent to [facility] pharmacy.

## 2023-05-29 NOTE — Telephone Encounter (Signed)
Patient has Nurse, learning disability and Medicaid. Test claim shows Sherryll Burger should be 4 dollars a month.

## 2023-06-05 ENCOUNTER — Ambulatory Visit: Payer: 59 | Attending: Family Medicine | Admitting: Family Medicine

## 2023-06-05 ENCOUNTER — Encounter: Payer: Self-pay | Admitting: Internal Medicine

## 2023-06-05 ENCOUNTER — Other Ambulatory Visit: Payer: Self-pay

## 2023-06-05 ENCOUNTER — Encounter: Payer: Self-pay | Admitting: Family Medicine

## 2023-06-05 VITALS — BP 108/71 | HR 68 | Ht 69.0 in | Wt 218.8 lb

## 2023-06-05 DIAGNOSIS — E1169 Type 2 diabetes mellitus with other specified complication: Secondary | ICD-10-CM | POA: Diagnosis not present

## 2023-06-05 DIAGNOSIS — Z7985 Long-term (current) use of injectable non-insulin antidiabetic drugs: Secondary | ICD-10-CM

## 2023-06-05 DIAGNOSIS — Z7984 Long term (current) use of oral hypoglycemic drugs: Secondary | ICD-10-CM

## 2023-06-05 DIAGNOSIS — K219 Gastro-esophageal reflux disease without esophagitis: Secondary | ICD-10-CM | POA: Diagnosis not present

## 2023-06-05 DIAGNOSIS — Z794 Long term (current) use of insulin: Secondary | ICD-10-CM | POA: Diagnosis not present

## 2023-06-05 DIAGNOSIS — F17200 Nicotine dependence, unspecified, uncomplicated: Secondary | ICD-10-CM

## 2023-06-05 DIAGNOSIS — I779 Disorder of arteries and arterioles, unspecified: Secondary | ICD-10-CM | POA: Diagnosis not present

## 2023-06-05 DIAGNOSIS — I110 Hypertensive heart disease with heart failure: Secondary | ICD-10-CM

## 2023-06-05 DIAGNOSIS — I5042 Chronic combined systolic (congestive) and diastolic (congestive) heart failure: Secondary | ICD-10-CM

## 2023-06-05 LAB — POCT GLYCOSYLATED HEMOGLOBIN (HGB A1C): HbA1c, POC (controlled diabetic range): 7.8 % — AB (ref 0.0–7.0)

## 2023-06-05 MED ORDER — LANTUS SOLOSTAR 100 UNIT/ML ~~LOC~~ SOPN
20.0000 [IU] | PEN_INJECTOR | Freq: Every day | SUBCUTANEOUS | 6 refills | Status: DC
Start: 2023-06-05 — End: 2024-01-30
  Filled 2023-06-05: qty 6, 30d supply, fill #0
  Filled 2023-06-29 – 2023-07-06 (×2): qty 6, 30d supply, fill #1

## 2023-06-05 MED ORDER — FAMOTIDINE 20 MG PO TABS
20.0000 mg | ORAL_TABLET | Freq: Every day | ORAL | 1 refills | Status: DC
Start: 2023-06-05 — End: 2024-05-01
  Filled 2023-06-05: qty 30, 30d supply, fill #0
  Filled 2023-07-06: qty 30, 30d supply, fill #1
  Filled 2023-07-31 (×2): qty 30, 30d supply, fill #2
  Filled 2023-10-26: qty 30, 30d supply, fill #3
  Filled 2024-03-07: qty 30, 30d supply, fill #4
  Filled 2024-04-18: qty 30, 30d supply, fill #5

## 2023-06-05 NOTE — Progress Notes (Signed)
Subjective:  Patient ID: Amy Chaney, female    DOB: [birth year]  Age: 52 y.o. MRN: [id number]  CC: Medical Management of Chronic Issues   HPI Amy Chaney is a 52 y.o. year old female with a history of Type 2 diabetes mellitus (A1c 7.8), hypertension, CHF (EF 50 to 55% from echo of 02/2021), CAD (status post PCI to LAD with 3 overlapping DES in 09/2017), STEMI in 06/2018.   Interval History: Discussed the use of AI scribe software for clinical note transcription with the patient, who gave verbal consent to proceed.   She admits to non-compliance with her insulin regimen, skipping doses intermittently. She has since resumed regular administration.  The patient also reports nightly heartburn, despite not eating late in the evening. She denies any abdominal pain associated with the heartburn. There is confusion about the correct dosage of hydralazine, with the patient believing she is taking 10mg  twice daily, while her records indicate a dosage of 25mg  twice daily and her cardiology notes reveal hydralazine dose was reduced to 50 mg twice daily. Last cardiology visit was in 04/2023; She denies pedal edema, chest pain or orthopnea.   The patient also reports discontinuing Wellbutrin due to nausea and vomiting. She continues to smoke and is attempting to quit.  She also reports that her insurance did not cover Repatha for cholesterol management but she remains on Crestor.       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 s/p PCI of the mid RCA 01/2013 //  b. LHC 9/14: EF 55%, RCA stent 100% occluded, LAD irregularities >>  subacute stent thrombosis, Promus DES PCI distal overlap // c) 09/2017 - Ant STEMI - LAD-D2 PCI - Successful PCI of LAD (3 overlapping DES), unable to restore flow down D2l that was stented as well.  Likely related to downstream dissection, but unable to rewire.    Daily headache    Depression    Dyslipidemia, goal LDL below 70    History of Doppler ultrasound    carotid bruit >> a. 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  EF down to 20-25% global hypokinesis.  Was in the setting of grieving over the loss of her son from MI.  ->  EF improved to 45-50% on follow-up echocardiogram.   Hypertension    Ischemic cardiomyopathy 06/2018   s/p inferior STEMI followed by 2 anterior STEMIs: Following recovery from Takotsubo Cardiomyopathy- Echo 02/14/19: Moderate LVH.  EF improved up to 45-50%.  GR 1 DD.  Severe apical akinesis.  Normal valves.   Mild dysplasia of cervix (CIN I) 02/04/2019   Seen after colposcopy on 01/23/19 done for ASCU +HPV pap  > repeat pap and HPV test in 12 and 24 months   STEMI involving left anterior descending coronary artery (HCC) 09/2017; 06/2018   a) Severe Medina 1, 1, 1 LAD-D2 lesion (complicated by post PTCA dissection/intramural hematoma) -successful extensive PCI of the LAD but unable to maintain patency of the stented D2. b) distal mLAD stent Edge 100% thrombosis --> PTCA & Overlapping DES PCI (Synergy 3 x 12 --> 3.6-3.3 mm). D2 occluded. RCA stents patent, 65 % OM2. EF 30-35%.   STEMI involving oth coronary artery of inferior wall (HCC) 03/2013   Secondary to subacute stent thrombosis of RCA  stent segment having missed 4 doses of Effient.   Tobacco abuse 01/11/2016   Type II diabetes mellitus (HCC) 12/2010   sister and son also diabetic     Past Surgical History:  Procedure Laterality Date   CHOLECYSTECTOMY  ~ 2000   CORONARY STENT INTERVENTION N/A 10/10/2017   Procedure: CORONARY STENT INTERVENTION;  Surgeon: Marykay Lex, MD;  Location: [facility] INVASIVE CV LAB;  Service: Cardiovascular; LAD PCI: STENT SYNERGY DES 3.5X32 (p),STENT SYNERGY DES 3.5X 8 (m), STENT SYNERGY DES 3.5X28 (d).  D2 PCI: STENT SYNERGY DES 2.5X24.   (UNABL TO REWIRE & EXPAND - TO OF DIAG AT END OF CASE)   CORONARY STENT INTERVENTION N/A 07/03/2018   Procedure: CORONARY STENT INTERVENTION;  Surgeon: Marykay Lex, MD;  Location: [facility] INVASIVE CV LAB;; PTCA & overlapping DES PCI (overlaps prior stents distally) - Synergy DES 3 x 12 (3.6 mm @ overlap -- 3.3 mm distally)>   LEFT HEART CATH AND CORONARY ANGIOGRAPHY N/A 10/10/2017   Procedure: LEFT HEART CATH AND CORONARY ANGIOGRAPHY;  Surgeon: Marykay Lex, MD;  Location: [facility] INVASIVE CV LAB;  Service: Cardiovascular;  Laterality: N/A; -patent RCA stents with mild in-stent restenosis. Medina 1,1,1 mLD-D2 90% (complicated by dissection/intramural thrombus)   LEFT HEART CATH AND CORONARY ANGIOGRAPHY N/A 07/03/2018   Procedure: LEFT HEART CATH AND CORONARY ANGIOGRAPHY;  Surgeon: Marykay Lex, MD;  Location: [facility] INVASIVE CV LAB;; 100% thrombotic occlusion distal LAD stent -- DES PCI. continued 100% D2 (previously lost). patent RCA stents, 65% OM2.  EF 30-35%.   LEFT HEART CATHETERIZATION WITH CORONARY ANGIOGRAM N/A 02/25/2013   Procedure: LEFT HEART CATHETERIZATION WITH CORONARY ANGIOGRAM;  Surgeon: Pamella Pert, MD;  Location: [facility] CATH LAB;  Service: Cardiovascular;; CTO m RCA; otherwise normal coronaries   LEFT HEART CATHETERIZATION WITH CORONARY ANGIOGRAM N/A 04/01/2013   Procedure: LEFT HEART CATHETERIZATION WITH CORONARY ANGIOGRAM;  Surgeon: Pamella Pert, MD;  Location: [facility] CATH LAB;  Service: Cardiovascular: 100% occlusion of distal RCA stent (subacute stent thrombosis -  secondary to stopping Effient).  Overlapping DES PCI   NM MYOVIEW LTD  12/2015    EF 38%.  Hypertensive response to exercise (231/132 mmHg).  INTERMEDIATE RISK due to -diffuse hypokinesis and reduced EF.  No ischemia or infarction.   PERCUTANEOUS CORONARY STENT INTERVENTION (PCI-S)  04/01/2013   Procedure: PERCUTANEOUS CORONARY STENT INTERVENTION (PCI-S);  Surgeon: Pamella Pert, MD;  Location: [facility] CATH LAB;  Service:  Cardiovascular;; PCI of distal RCA stent subacute thrombosis: Promus Premier DES 3.0 mm x 20 mm.   PERCUTANEOUS CORONARY STENT INTERVENTION (PCI-S)  02/25/2013   Procedure: PERCUTANEOUS CORONARY STENT INTERVENTION (PCI-S);  Surgeon: Pamella Pert, MD;  Location: [facility] CATH LAB;  RCA CTO PCI with overlapping Promus DES: 3.0 mm x 38 mm, 3.5 mm x 16mm   RIGHT/LEFT HEART CATH AND CORONARY ANGIOGRAPHY N/A 11/04/2019   Procedure: RIGHT/LEFT HEART CATH AND CORONARY ANGIOGRAPHY;  Surgeon: Marykay Lex, MD;  Location: [facility] INVASIVE CV LAB;; Mildly elevated LVEDP.  Ostial D1 100%.  Proximal to mid LAD stent 10% ISR.  Mid and distal stent widely patent.  30% distal distance.  Ostial OM 2 55%.  Proximal to mid RCA stent 50% stenosis.  Mid to distal RCA 40%.  Suspect that reduced EF is related to hypertensive emergen   TRANSTHORACIC ECHOCARDIOGRAM  07/04/2018   recurrent Anterior STEMI: EF 35-40%.  Apical hypokinesis.  GRII DD.  No thrombus noted.  (Improved EF from 30% up to 35-40% from  previous echo)   TRANSTHORACIC ECHOCARDIOGRAM  11/2018; 01/2019   a) TAKOTUSBO CM: EF 20-25%-diffuse HK..  Mod V thickness.  GRII DD w/ high LVEDP.  Severe LA dilation;; b) 02/14/19: Moderate LVH.  EF improved up to 45-50%.  GR 1 DD.  Severe apical akinesis.  Normal valves.   TRANSTHORACIC ECHOCARDIOGRAM  10/31/2019   Hypertensive emergency, hypoxic/hypercapnic respiratory failure;  EF 20 to 25%.  Global HK.  Mildly dilated LV.  Mildly elevated PA pressures.  Mild LA dilation.  Mild AR.  Mildly elevated RAP-8 mmHg.=> Eventual cath showed no change   TUBAL LIGATION  1994    Family History  Problem Relation Age of Onset   Diabetes Mother    Diabetes Sister    Diabetes Sister    Diabetes Son        type 1   Liver disease Neg Hx    Colon cancer Neg Hx    Esophageal cancer Neg Hx     Social History   Socioeconomic History   Marital status: Single    Spouse name: Not on file   Number of children: 3   Years of education:  Not on file   Highest education level: 11th grade  Occupational History    Employer: [employer]   Occupation: pca  Tobacco Use   Smoking status: Every Day    Current packs/day: 0.25    Average packs/day: 0.3 packs/day for 22.0 years (5.5 ttl pk-yrs)    Types: Cigarettes   Smokeless tobacco: Never   Tobacco comments:    current smoker 4-5 cigarettes per day; previously smoked at least a pack a day.  Vaping Use   Vaping status: Never Used  Substance and Sexual Activity   Alcohol use: Yes    Alcohol/week: 0.0 standard drinks of alcohol    Comment: 07/04/2018 "only on special occasions; maybe once/yr"   Drug use: Yes    Types: Marijuana    Comment: 02/25/2013 "quit marijuana ~ 2001"   Sexual activity: Not Currently    Birth control/protection: Surgical  Other Topics Concern   Not on file  Social History Narrative   Diet: 50/50 take out and home cook meals (eats burgers and lots of fried foods), limits portions.       Exercise: walks 15-20 minutes twice per week   Social Determinants of Health   Financial Resource Strain: High Risk (04/03/2023)   Overall Financial Resource Strain (CARDIA)    Difficulty of Paying Living Expenses: Very hard  Food Insecurity: Food Insecurity Present (12/12/2022)   Hunger Vital Sign    Worried About Running Out of Food in the Last Year: Sometimes true    Ran Out of Food in the Last Year: Sometimes true  Transportation Needs: No Transportation Needs (12/12/2022)   PRAPARE - Administrator, Civil Service (Medical): No    Lack of Transportation (Non-Medical): No  Physical Activity: Inactive (12/12/2022)   Exercise Vital Sign    Days of Exercise per Week: 0 days    Minutes of Exercise per Session: 0 min  Stress: No Stress Concern Present (12/12/2022)   [employer] of Occupational Health - Occupational Stress Questionnaire    Feeling of Stress : Only a little  Social Connections: Moderately Integrated (12/12/2022)   Social Connection  and Isolation Panel [NHANES]    Frequency of Communication with Friends and Family: Three times a week    Frequency of Social Gatherings with Friends and Family: Three times a week  Attends Religious Services: 1 to 4 times per year    Active Member of Clubs or Organizations: Yes    Attends Banker Meetings: 1 to 4 times per year    Marital Status: Divorced    Allergies  Allergen Reactions   Farxiga [Dapagliflozin] Other (See Comments)    Caused UTI and polyps on kidneys     Outpatient Medications Prior to Visit  Medication Sig Dispense Refill   carvedilol (COREG) 25 MG tablet Take 1 tablet (25 mg total) by mouth 2 (two) times daily. 180 tablet 1   clopidogrel (PLAVIX) 75 MG tablet Take 1 tablet (75 mg total) by mouth daily. 90 tablet 0   Continuous Glucose Receiver (DEXCOM G7 RECEIVER) DEVI Use to check blood sugar throughout the day. E11.65 1 each 0   Continuous Glucose Sensor (DEXCOM G7 SENSOR) MISC Use to check blood sugar throughout the day. Change sensors once every 10 days. E11.65 3 each 6   Dulaglutide (TRULICITY) 1.5 MG/0.5ML SOAJ Inject 1.5 mg into the skin once a week. Start once you finish 4 weeks of the 0.75mg  pen. 6 mL 1   hydrALAZINE (APRESOLINE) 25 MG tablet Take 1 tablet (25 mg total) by mouth in the morning and at bedtime. 90 tablet 3   Insulin Pen Needle (TECHLITE PEN NEEDLES) 32G X 4 MM MISC Use 1 pen needle with insulin once daily 100 each 2   Multiple Vitamins-Minerals (WOMENS 50+ MULTI VITAMIN PO) Take 1 tablet by mouth daily.     nitroGLYCERIN (NITROSTAT) 0.4 MG SL tablet Place 1 tablet (0.4 mg total) under the tongue every 5 (five) minutes as needed for chest pain. Reported on 11/25/2015 25 tablet 3   rosuvastatin (CRESTOR) 40 MG tablet Take 1 tablet (40 mg total) by mouth daily. 90 tablet 1   sacubitril-valsartan (ENTRESTO) 97-103 MG Take 1 tablet by mouth 2 (two) times daily. 180 tablet 2   spironolactone (ALDACTONE) 25 MG tablet Take 0.5 tablets  (12.5 mg total) by mouth daily. 90 tablet 1   TRUEPLUS LANCETS 28G MISC 28 g by Does not apply route 4 (four) times daily. 120 each 2   insulin glargine (LANTUS SOLOSTAR) 100 UNIT/ML Solostar Pen Inject 26 Units into the skin daily. 15 mL 2   furosemide (LASIX) 20 MG tablet Take 1 tablet (20 mg total) by mouth as needed. 180 tablet 1   gabapentin (NEURONTIN) 300 MG capsule Take 1 capsule (300 mg total) by mouth 3 (three) times daily. (Patient not taking: Reported on 09/26/2022) 90 capsule 6   No facility-administered medications prior to visit.     ROS Review of Systems  Constitutional:  Negative for activity change and appetite change.  HENT:  Negative for sinus pressure and sore throat.   Respiratory:  Negative for chest tightness, shortness of breath and wheezing.   Cardiovascular:  Negative for chest pain and palpitations.  Gastrointestinal:  Negative for abdominal distention, abdominal pain and constipation.  Genitourinary: Negative.   Musculoskeletal: Negative.   Psychiatric/Behavioral:  Negative for behavioral problems and dysphoric mood.     Objective:  BP 108/71   Pulse 68   Ht 5\' 9"  (1.753 m)   Wt 218 lb 12.8 oz (99.2 kg)   LMP 10/20/2015   SpO2 100%   BMI 32.31 kg/m      06/05/2023    9:24 AM 05/23/2023    9:59 AM 04/13/2023    8:50 AM  BP/Weight  Systolic BP 108 130 124  Diastolic BP 71  80 66  Wt. (Lbs) 218.8 217   BMI 32.31 kg/m2 32.05 kg/m2       Physical Exam Constitutional:      Appearance: She is well-developed.  Cardiovascular:     Rate and Rhythm: Normal rate.     Heart sounds: Normal heart sounds. No murmur heard. Pulmonary:     Effort: Pulmonary effort is normal.     Breath sounds: Normal breath sounds. No wheezing or rales.  Chest:     Chest wall: No tenderness.  Abdominal:     General: Bowel sounds are normal. There is no distension.     Palpations: Abdomen is soft. There is no mass.     Tenderness: There is no abdominal tenderness.   Musculoskeletal:        General: Normal range of motion.     Right lower leg: No edema.     Left lower leg: No edema.  Neurological:     Mental Status: She is alert and oriented to person, place, and time.  Psychiatric:        Mood and Affect: Mood normal.        Latest Ref Rng & Units 04/13/2023    8:59 AM 07/06/2022    3:56 PM 03/17/2022    3:30 PM  CMP  Glucose 70 - 99 mg/dL 244  010  72   BUN 6 - 24 mg/dL 20  9  22    Creatinine 0.57 - 1.00 mg/dL 2.72  5.36  6.44   Sodium 134 - 144 mmol/L 139  134  142   Potassium 3.5 - 5.2 mmol/L 4.0  3.7  3.6   Chloride 96 - 106 mmol/L 102  100  99   CO2 20 - 29 mmol/L 22  23  25    Calcium 8.7 - 10.2 mg/dL 9.2  9.0  9.4   Total Protein 6.0 - 8.5 g/dL 7.1  7.5    Total Bilirubin 0.0 - 1.2 mg/dL 0.4  0.7    Alkaline Phos 44 - 121 IU/L 45  39    AST 0 - 40 IU/L 15  20    ALT 0 - 32 IU/L 10  15      Lipid Panel     Component Value Date/Time   CHOL 113 04/13/2023 0859   TRIG 97 04/13/2023 0859   HDL 44 04/13/2023 0859   CHOLHDL 2.6 04/13/2023 0859   CHOLHDL 4.2 07/05/2018 0330   VLDL 15 07/05/2018 0330   LDLCALC 51 04/13/2023 0859    CBC    Component Value Date/Time   WBC 11.2 (H) 04/13/2023 0859   WBC 3.5 (L) 07/06/2022 1556   RBC 4.40 04/13/2023 0859   RBC 4.37 07/06/2022 1556   HGB 11.9 04/13/2023 0859   HCT 37.2 04/13/2023 0859   PLT 361 04/13/2023 0859   MCV 85 04/13/2023 0859   MCH 27.0 04/13/2023 0859   MCH 27.0 07/06/2022 1556   MCHC 32.0 04/13/2023 0859   MCHC 33.3 07/06/2022 1556   RDW 13.4 04/13/2023 0859   LYMPHSABS 0.9 07/06/2022 1556   LYMPHSABS 3.1 03/14/2022 1653   MONOABS 0.6 07/06/2022 1556   EOSABS 0.2 07/06/2022 1556   EOSABS 0.4 03/14/2022 1653   BASOSABS 0.1 07/06/2022 1556   BASOSABS 0.1 03/14/2022 1653    Lab Results  Component Value Date   HGBA1C 7.8 (A) 06/05/2023    Assessment & Plan:      Type 2 Diabetes Mellitus Suboptimal control with recent non-adherence to insulin regimen.  Last A1c was 7.9. Patient reports resuming regular insulin use. -Continue Lantus 20 units daily and Trulicity 1.5mg  weekly. -Check A1c in 3 months. -Counseled on Diabetic diet, my plate method, 010 minutes of moderate intensity exercise/week Blood sugar logs with fasting goals of 80-120 mg/dl, random of less than 272 and in the event of sugars less than 60 mg/dl or greater than 536 mg/dl encouraged to notify the clinic. Advised on the need for annual eye exams, annual foot exams, Pneumonia vaccine.   Hypertension Well controlled today, but patient reports taking a lower dose of hydralazine than what is documented in the chart. -Reviewed home log with indicates some isolated elevated systolic blood pressures in the 140s to 150 range but mostly blood pressures are less than 140/80 -Verify home dose of hydralazine and adjust in chart accordingly. -Counseled on blood pressure goal of less than 130/80, low-sodium, DASH diet, medication compliance, 150 minutes of moderate intensity exercise per week. Discussed medication compliance, adverse effects.   Gastroesophageal Reflux Disease Reports nocturnal heartburn. -Advise patient to avoid eating late and to remain upright for at least 2 hours after meals. -Prescribe Pepcid to be taken at night.  Hyperlipidemia Patient reports inability to afford Repatha. -Continue rosuvastatin.  Tobacco Use Patient reports ongoing smoking and intolerance to cessation medication. -Encourage continued attempts at cessation.  Heart Failure No current symptoms of decompensation. -Continue current regimen of Entresto and carvedilol. -EF 50 to 55%  General Health Maintenance -Continue home blood pressure monitoring. -Check A1c in 3 months.          Meds ordered this encounter  Medications   insulin glargine (LANTUS SOLOSTAR) 100 UNIT/ML Solostar Pen    Sig: Inject 20 Units into the skin daily.    Dispense:  30 mL    Refill:  6   famotidine (PEPCID)  20 MG tablet    Sig: Take 1 tablet (20 mg total) by mouth at bedtime.    Dispense:  90 tablet    Refill:  1    Follow-up: Return in about 3 months (around 09/05/2023) for Chronic medical conditions.       Hoy Register, MD, FAAFP. [facility] [place], [place] [phone]   06/05/2023, 9:47 AM

## 2023-06-05 NOTE — Patient Instructions (Signed)
VISIT SUMMARY:  You came in today for a routine follow-up visit. We discussed your ongoing health conditions, including diabetes, hypertension, heartburn, high cholesterol, smoking, and heart failure. We reviewed your medications and made some adjustments to better manage your conditions. You also shared some challenges you are facing with medication adherence and insurance coverage.  YOUR PLAN:  -TYPE 2 DIABETES MELLITUS: Type 2 Diabetes Mellitus is a condition where your body does not use insulin properly, leading to high blood sugar levels. You have resumed your regular insulin use, which is good. Continue taking Lantus 20 units daily and Trulicity 1.5mg  weekly. We will check your A1c again in 3 months to see how well your blood sugar is being controlled.  -HYPERTENSION: Hypertension is high blood pressure. Your blood pressure is well controlled today, but there was some confusion about your hydralazine dosage. We will verify your home dose and adjust our records accordingly.  -GASTROESOPHAGEAL REFLUX DISEASE (GERD): GERD is a condition where stomach acid frequently flows back into the tube connecting your mouth and stomach, causing heartburn. You are experiencing nightly heartburn. Avoid eating late and try to stay upright for at least 2 hours after meals. We have prescribed Pepcid for you to take at night.  -HYPERLIPIDEMIA: Hyperlipidemia is having high levels of fats (lipids) in your blood, which can increase your risk of heart disease. Since your insurance does not cover Repatha, continue taking rosuvastatin as prescribed.  -TOBACCO USE: Smoking is harmful to your health and quitting is highly recommended. You have had trouble with the smoking cessation medication due to nausea and vomiting. Please continue your efforts to quit smoking.  -HEART FAILURE: Heart failure means your heart is not pumping blood as well as it should. You are not experiencing any symptoms right now. Continue taking  your current medications, Entresto and carvedilol, as prescribed.  -GENERAL HEALTH MAINTENANCE: Continue monitoring your blood pressure at home. We will check your A1c again in 3 months to monitor your diabetes control.  INSTRUCTIONS:  Please follow up in 3 months for an A1c test to monitor your diabetes control. Continue taking your medications as prescribed and monitor your blood pressure at home. If you have any new symptoms or concerns, please contact our office.

## 2023-06-06 ENCOUNTER — Telehealth: Payer: Self-pay | Admitting: Family Medicine

## 2023-06-06 ENCOUNTER — Telehealth: Payer: Self-pay

## 2023-06-06 NOTE — Telephone Encounter (Signed)
Noted  

## 2023-06-06 NOTE — Telephone Encounter (Signed)
I spoke to the patient and she stated that her landlord informed her that they didn't receive the rent payment for September from [facility]. I told the patient that I spoke to [employer] 04/30/2023 and she said they received the check. I told her that I would follow up with Asher Muir again.   I tried to reach [employer]: [phone]  and had to leave a message requesting a call back.  Need to determine if the rent payment of $700 was received.   I called the patient back and informed her that I left a message for Asher Muir and I will continue to work on addressing this issue.

## 2023-06-06 NOTE — Telephone Encounter (Signed)
Copied from CRM [id number]. Topic: General - Other >> Jun 06, 2023  8:24 AM Clide Dales wrote: Patient called to advise pcp that she is taking hydrALAZINE (APRESOLINE) 25 MG tablet. Patient is also requesting a callback from [place].

## 2023-06-06 NOTE — Telephone Encounter (Signed)
Patient called to advise pcp that she is taking hydrALAZINE (APRESOLINE) 25 MG tablet

## 2023-06-06 NOTE — Telephone Encounter (Signed)
FYI

## 2023-06-07 NOTE — Telephone Encounter (Signed)
I called [employer] and left another message requesting a call back.

## 2023-06-07 NOTE — Telephone Encounter (Signed)
Call received from [facility] [address] [facility]. She stated that they did not receive a $700 payment from [facility] despite the fact that I was told on 04/30/2023 that a payment was received.  I asked Asher Muir for a W-9 and she said she is not permitted to release the W-9.  I asked if I could share her contact information with our Accounts Payable Department and she said yes, she just requests that if they have to leave a message for her, they leave a direct number to call back.  I told Asher Muir that we will continue to work to secure the $700 rent payment for the patient.  This information was shared with Ailene Ards, Practice Administrator, who will notify Accounts Payable and request they reach out directly to [facility].

## 2023-06-13 ENCOUNTER — Other Ambulatory Visit: Payer: Self-pay

## 2023-06-14 ENCOUNTER — Other Ambulatory Visit: Payer: Self-pay

## 2023-06-14 NOTE — Telephone Encounter (Signed)
I tried to reach Amy Chaney but the voicemail was full.  I then sent Amy Chaney an email inquiring if she has heard from Amy Chaney about the rent payment. Message also sent to Amy Chaney, Practice Administrator, inquiring if she has any information about the status of the check request

## 2023-06-14 NOTE — Telephone Encounter (Signed)
Message received from [facility] stating she has not heard anything from [facility].   I shared that information with Ailene Ards, Practice Administrator

## 2023-06-15 ENCOUNTER — Ambulatory Visit (AMBULATORY_SURGERY_CENTER): Payer: 59 | Admitting: Internal Medicine

## 2023-06-15 ENCOUNTER — Encounter: Payer: Self-pay | Admitting: Internal Medicine

## 2023-06-15 VITALS — BP 129/52 | HR 65 | Temp 97.5°F | Resp 14 | Ht 69.0 in | Wt 217.0 lb

## 2023-06-15 DIAGNOSIS — Z1211 Encounter for screening for malignant neoplasm of colon: Secondary | ICD-10-CM

## 2023-06-15 DIAGNOSIS — F32A Depression, unspecified: Secondary | ICD-10-CM | POA: Diagnosis not present

## 2023-06-15 DIAGNOSIS — D124 Benign neoplasm of descending colon: Secondary | ICD-10-CM | POA: Diagnosis not present

## 2023-06-15 DIAGNOSIS — I252 Old myocardial infarction: Secondary | ICD-10-CM | POA: Diagnosis not present

## 2023-06-15 DIAGNOSIS — K635 Polyp of colon: Secondary | ICD-10-CM | POA: Diagnosis not present

## 2023-06-15 DIAGNOSIS — D122 Benign neoplasm of ascending colon: Secondary | ICD-10-CM

## 2023-06-15 DIAGNOSIS — I2510 Atherosclerotic heart disease of native coronary artery without angina pectoris: Secondary | ICD-10-CM | POA: Diagnosis not present

## 2023-06-15 DIAGNOSIS — E119 Type 2 diabetes mellitus without complications: Secondary | ICD-10-CM | POA: Diagnosis not present

## 2023-06-15 MED ORDER — SODIUM CHLORIDE 0.9 % IV SOLN: 500.0000 mL | Freq: Once | INTRAVENOUS | Status: DC

## 2023-06-15 NOTE — Op Note (Addendum)
[facility] Patient Name: Amy Chaney Procedure Date: 06/15/2023 9:25 AM MRN: [id number] Endoscopist: Wilhemina Bonito. Marina Goodell , MD, [id number] Age: 52 Referring MD:  Date of Birth: 08/31/1970 Gender: Female Account #: [id number] Procedure:                Colonoscopy with cold snare polypectomy x 1 Indications:              Screening for colorectal malignant neoplasm Medicines:                Monitored Anesthesia Care Procedure:                Pre-Anesthesia Assessment:                           - Prior to the procedure, a History and Physical                            was performed, and patient medications and                            allergies were reviewed. The patient's tolerance of                            previous anesthesia was also reviewed. The risks                            and benefits of the procedure and the sedation                            options and risks were discussed with the patient.                            All questions were answered, and informed consent                            was obtained. Prior Anticoagulants: The patient has                            taken Plavix (clopidogrel), last dose was 6 days                            prior to procedure. ASA Grade Assessment: III - A                            patient with severe systemic disease. After                            reviewing the risks and benefits, the patient was                            deemed in satisfactory condition to undergo the                            procedure.  After obtaining informed consent, the colonoscope                            was passed under direct vision. Throughout the                            procedure, the patient's blood pressure, pulse, and                            oxygen saturations were monitored continuously. The                            CF [id number] #[id number] was introduced through the anus                            and  advanced to the the cecum, identified by                            appendiceal orifice and ileocecal valve. The                            ileocecal valve, appendiceal orifice, and rectum                            were photographed. The quality of the bowel                            preparation was good. The colonoscopy was performed                            without difficulty. The patient tolerated the                            procedure well. The bowel preparation used was                            SUPREP via split dose instruction. Scope In: 9:39:29 AM Scope Out: 9:54:21 AM Scope Withdrawal Time: 0 hours 8 minutes 38 seconds  Total Procedure Duration: 0 hours 14 minutes 52 seconds  Findings:                 Two polyps were found in the descending colon and                            ascending colon. The polyps were 3 to 5 mm in size.                            These polyps were removed with a cold snare.                            Resection and retrieval were complete.                           The exam was otherwise  without abnormality on                            direct and retroflexion views. Complications:            No immediate complications. Estimated blood loss:                            None. Estimated Blood Loss:     Estimated blood loss: none. Impression:               - Two 3 to 5 mm polyps in the descending colon and                            in the ascending colon, removed with a cold snare.                            Resected and retrieved.                           - The examination was otherwise normal on direct                            and retroflexion views. Recommendation:           - Repeat colonoscopy in 7 years for surveillance.                           - Patient has a contact number available for                            emergencies. The signs and symptoms of potential                            delayed complications were discussed with the                             patient. Return to normal activities tomorrow.                            Written discharge instructions were provided to the                            patient.                           - Resume previous diet.                           - Continue present medications.                           - Await pathology results.                           -Resume Plavix today Mamadou Breon N. Marina Goodell, MD 06/15/2023 9:59:07 AM This report has been signed electronically.

## 2023-06-15 NOTE — Patient Instructions (Addendum)
Read all of the handouts and discharge instructions given to you  by your recovery room nurse.  You may resume your plavix today.  YOU HAD AN ENDOSCOPIC PROCEDURE TODAY AT THE [facility]:   Refer to the procedure report that was given to you for any specific questions about what was found during the examination.  If the procedure report does not answer your questions, please call your gastroenterologist to clarify.  If you requested that your care partner not be given the details of your procedure findings, then the procedure report has been included in a sealed envelope for you to review at your convenience later.  YOU SHOULD EXPECT: Some feelings of bloating in the abdomen. Passage of more gas than usual.  Walking can help get rid of the air that was put into your GI tract during the procedure and reduce the bloating. If you had a lower endoscopy (such as a colonoscopy or flexible sigmoidoscopy) you may notice spotting of blood in your stool or on the toilet paper. If you underwent a bowel prep for your procedure, you may not have a normal bowel movement for a few days.  Please Note:  You might notice some irritation and congestion in your nose or some drainage.  This is from the oxygen used during your procedure.  There is no need for concern and it should clear up in a day or so.  SYMPTOMS TO REPORT IMMEDIATELY:  Following lower endoscopy (colonoscopy or flexible sigmoidoscopy):  Excessive amounts of blood in the stool  Significant tenderness or worsening of abdominal pains  Swelling of the abdomen that is new, acute  Fever of 100F or higher   For urgent or emergent issues, a gastroenterologist can be reached at any hour by calling [phone]. Do not use MyChart messaging for urgent concerns.    DIET:  We do recommend a small meal at first, but then you may proceed to your regular diet.  Drink plenty of fluids but you should avoid alcoholic beverages for 24  hours.  ACTIVITY:  You should plan to take it easy for the rest of today and you should NOT DRIVE or use heavy machinery until tomorrow (because of the sedation medicines used during the test).    FOLLOW UP: Our staff will call the number listed on your records the next business day following your procedure.  We will call around 7:15- 8:00 am to check on you and address any questions or concerns that you may have regarding the information given to you following your procedure. If we do not reach you, we will leave a message.     If any biopsies were taken you will be contacted by phone or by letter within the next 1-3 weeks.  Please call us at [phone] if you have not heard about the biopsies in 3 weeks.    SIGNATURES/CONFIDENTIALITY: You and/or your care partner have signed paperwork which will be entered into your electronic medical record.  These signatures attest to the fact that that the information above on your After Visit Summary has been reviewed and is understood.  Full responsibility of the confidentiality of this discharge information lies with you and/or your care-partner.

## 2023-06-15 NOTE — Progress Notes (Signed)
Pt's states no medical or surgical changes since previsit or office visit. 

## 2023-06-15 NOTE — Progress Notes (Signed)
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  She is sent today by her primary care provider regarding the need for screening colonoscopy.   Patient tells me that she does have occasional constipation.  This is stable.  No rectal bleeding.  No family history of colon cancer.  No prior colorectal neoplasia screening.   Review of blood work from April 13, 2023 shows a hemoglobin of 11.9, creatinine 1.13.  Last hemoglobin A1c June 2024 was 7.9.  Her last ejection fraction on echo was August 2022 at 50 to 55%.  She sees Dr. Herbie Baltimore for her cardiology needs.   CT scan of the abdomen and pelvis with contrast December 2021 to evaluate nausea and vomiting revealed changes suggesting pyelonephritis.  Otherwise negative.  She is status post cholecystectomy   REVIEW OF SYSTEMS:   All non-GI ROS negative except for       Past Medical History:  Diagnosis Date   Coronary artery disease involving native coronary artery of native heart with angina pectoris (HCC) 01/2013    a. s/p PCI of the mid RCA 01/2013 //  b. LHC 9/14: EF 55%, RCA stent 100% occluded, LAD irregularities >>  subacute stent thrombosis, Promus DES PCI distal overlap // c) 09/2017 - Ant STEMI - LAD-D2 PCI - Successful PCI of LAD (3 overlapping DES), unable to restore flow down D2l that was stented as well.  Likely related to downstream dissection, but unable to rewire.   Daily headache     Depression     Dyslipidemia, goal LDL below 70     History of Doppler ultrasound      carotid bruit >> a. Carotid US 6/17: bilat ICA 1-39%   History of Takotsubo cardiomyopathy 11/2018     Admitted for acute on chronic combined systolic and diastolic heart failure.  EF down to 20-25% global hypokinesis.  Was in the setting of grieving over the loss of her son from MI.  ->  EF improved to 45-50% on follow-up echocardiogram.   Hypertension     Ischemic cardiomyopathy 06/2018    s/p inferior STEMI followed by 2 anterior STEMIs: Following recovery from Takotsubo Cardiomyopathy- Echo 02/14/19: Moderate LVH.  EF improved up to 45-50%.  GR 1 DD.  Severe apical akinesis.  Normal valves.   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 b) distal mLAD stent Edge 100% thrombosis --> PTCA & Overlapping DES PCI (Synergy 3 x 12 --> 3.6-3.3 mm). D2 occluded. RCA stents patent, 65 % OM2. EF 30-35%.   STEMI involving oth coronary artery of inferior wall (HCC) 03/2013    Secondary to subacute stent thrombosis of RCA stent segment having missed 4 doses of Effient.   Tobacco abuse 01/11/2016   Type II diabetes mellitus (HCC) 12/2010  sister and son also diabetic                Past Surgical History:  Procedure Laterality Date   CHOLECYSTECTOMY   ~ 2000   CORONARY STENT INTERVENTION N/A 10/10/2017    Procedure: CORONARY STENT INTERVENTION;  Surgeon: Marykay Lex, MD;  Location: [facility] INVASIVE CV LAB;  Service: Cardiovascular; LAD PCI: STENT SYNERGY DES 3.5X32 (p),STENT SYNERGY DES 3.5X 8 (m), STENT SYNERGY DES 3.5X28 (d).  D2 PCI: STENT SYNERGY DES 2.5X24.  (UNABL TO REWIRE & EXPAND - TO OF DIAG AT END OF CASE)   CORONARY STENT INTERVENTION N/A 07/03/2018    Procedure: CORONARY STENT INTERVENTION;  Surgeon: Marykay Lex, MD;   Location: [facility] INVASIVE CV LAB;; PTCA & overlapping DES PCI (overlaps prior stents distally) - Synergy DES 3 x 12 (3.6 mm @ overlap -- 3.3 mm distally)>   LEFT HEART CATH AND CORONARY ANGIOGRAPHY N/A 10/10/2017    Procedure: LEFT HEART CATH AND CORONARY ANGIOGRAPHY;  Surgeon: Marykay Lex, MD;  Location: [facility] INVASIVE CV LAB;  Service: Cardiovascular;  Laterality: N/A; -patent RCA stents with mild in-stent restenosis. Medina 1,1,1 mLD-D2 90% (complicated by dissection/intramural thrombus)   LEFT HEART CATH AND CORONARY ANGIOGRAPHY N/A 07/03/2018    Procedure: LEFT HEART CATH AND CORONARY ANGIOGRAPHY;  Surgeon: Marykay Lex, MD;  Location: [facility] INVASIVE CV LAB;; 100% thrombotic occlusion distal LAD stent -- DES PCI. continued 100% D2 (previously lost). patent RCA stents, 65% OM2.  EF 30-35%.   LEFT HEART CATHETERIZATION WITH CORONARY ANGIOGRAM N/A 02/25/2013    Procedure: LEFT HEART CATHETERIZATION WITH CORONARY ANGIOGRAM;  Surgeon: Pamella Pert, MD;  Location: [facility] CATH LAB;  Service: Cardiovascular;; CTO m RCA; otherwise normal coronaries   LEFT HEART CATHETERIZATION WITH CORONARY ANGIOGRAM N/A 04/01/2013    Procedure: LEFT HEART CATHETERIZATION WITH CORONARY ANGIOGRAM;  Surgeon: Pamella Pert, MD;  Location: [facility] CATH LAB;  Service: Cardiovascular: 100% occlusion of distal RCA stent (subacute stent thrombosis -  secondary to stopping Effient).  Overlapping DES PCI   NM MYOVIEW LTD   12/2015     EF 38%.  Hypertensive response to exercise (231/132 mmHg).  INTERMEDIATE RISK due to -diffuse hypokinesis and reduced EF.  No ischemia or infarction.   PERCUTANEOUS CORONARY STENT INTERVENTION (PCI-S)   04/01/2013    Procedure: PERCUTANEOUS CORONARY STENT INTERVENTION (PCI-S);  Surgeon: Pamella Pert, MD;  Location: [facility] CATH LAB;  Service: Cardiovascular;; PCI of distal RCA stent subacute thrombosis: Promus Premier DES 3.0 mm x 20 mm.   PERCUTANEOUS CORONARY STENT INTERVENTION (PCI-S)   02/25/2013     Procedure: PERCUTANEOUS CORONARY STENT INTERVENTION (PCI-S);  Surgeon: Pamella Pert, MD;  Location: [facility] CATH LAB;  RCA CTO PCI with overlapping Promus DES: 3.0 mm x 38 mm, 3.5 mm x 16mm   RIGHT/LEFT HEART CATH AND CORONARY ANGIOGRAPHY N/A 11/04/2019    Procedure: RIGHT/LEFT HEART CATH AND CORONARY ANGIOGRAPHY;  Surgeon: Marykay Lex, MD;  Location: [facility] INVASIVE CV LAB;; Mildly elevated LVEDP.  Ostial D1 100%.  Proximal to mid LAD stent 10% ISR.  Mid and distal stent widely patent.  30% distal distance.  Ostial OM 2 55%.  Proximal to mid RCA stent 50% stenosis.  Mid to distal RCA 40%.  Suspect that reduced EF is related to hypertensive emergen   TRANSTHORACIC ECHOCARDIOGRAM   07/04/2018    recurrent Anterior STEMI: EF 35-40%.  Apical hypokinesis.  GRII DD.  No thrombus noted.  (Improved EF from 30%  up to 35-40% from previous echo)   TRANSTHORACIC ECHOCARDIOGRAM   11/2018; 01/2019    a) TAKOTUSBO CM: EF 20-25%-diffuse HK..  Mod V thickness.  GRII DD w/ high LVEDP.  Severe LA dilation;; b) 02/14/19: Moderate LVH.  EF improved up to 45-50%.  GR 1 DD.  Severe apical akinesis.  Normal valves.   TRANSTHORACIC ECHOCARDIOGRAM   10/31/2019    Hypertensive emergency, hypoxic/hypercapnic respiratory failure;  EF 20 to 25%.  Global HK.  Mildly dilated LV.  Mildly elevated PA pressures.  Mild LA dilation.  Mild AR.  Mildly elevated RAP-8 mmHg.=> Eventual cath showed no change   TUBAL LIGATION   1994          Social History VERNETHA QUERRY  reports that she has been smoking cigarettes. She has a 5.5 pack-year smoking history. She has never used smokeless tobacco. She reports current alcohol use. She reports current drug use. Drug: Marijuana.   family history includes Diabetes in her mother, sister, sister, and son.   Allergies       Allergies  Allergen Reactions   Farxiga [Dapagliflozin] Other (See Comments)      Caused UTI and polyps on kidneys             PHYSICAL EXAMINATION: Vital signs: BP  130/80   Pulse 76   Ht 5\' 9"  (1.753 m)   Wt 217 lb (98.4 kg)   LMP 10/20/2015   BMI 32.05 kg/m   Constitutional: Pleasant, unhealthy appearing, no acute distress.  Smells of cigarette smoke Psychiatric: alert and oriented x3, cooperative Eyes: extraocular movements intact, anicteric, conjunctiva pink Mouth: oral pharynx moist, no lesions Neck: supple no lymphadenopathy Cardiovascular: heart regular rate and rhythm, no murmur Lungs: clear to auscultation bilaterally Abdomen: soft, obese, nontender, nondistended, no obvious ascites, no peritoneal signs, normal bowel sounds, no organomegaly Rectal: Deferred until colonoscopy Extremities: no clubbing, or cyanosis.  Trace lower extremity edema bilaterally Skin: no lesions on visible extremities Neuro: No focal deficits.  Cranial nerves intact   ASSESSMENT:   1.  Colon cancer screening.  Average risk for neoplasia.  High risk for invasive procedure due to comorbidities and body habitus. 2.  Insulin requiring diabetes mellitus 3.  History of coronary artery disease with remote stent placement on Plavix 4.  Morbid obesity 5.  Ongoing tobacco abuse     PLAN:   1.  Screening colonoscopy.  She is high risk due to her comorbidities, the need to address multiple medications, ongoing smoking, and body habitus.The nature of the procedure, as well as the risks, benefits, and alternatives were carefully and thoroughly reviewed with the patient. Ample time for discussion and questions allowed. The patient understood, was satisfied, and agreed to proceed. 2.  Hold Plavix 5 days prior to the procedure.  We will discuss with her cardiologist Dr. Herbie Baltimore 3.  Hold Trulicity for at least 1 week prior to the procedure.  She understands 4.  Adjust diabetic medications to avoid unwanted hypoglycemia 5.  Stop smoking 6.  Weight loss

## 2023-06-15 NOTE — Progress Notes (Signed)
Report to PACU, RN, vss, BBS= Clear.  

## 2023-06-15 NOTE — Progress Notes (Signed)
Called to room to assist during endoscopic procedure.  Patient ID and intended procedure confirmed with present staff. Received instructions for my participation in the procedure from the performing physician.  

## 2023-06-18 ENCOUNTER — Telehealth: Payer: Self-pay

## 2023-06-18 NOTE — Telephone Encounter (Signed)
No answer, left message to call if having any issues or concerns, B.Lj Miyamoto RN 

## 2023-06-19 ENCOUNTER — Encounter: Payer: Self-pay | Admitting: Internal Medicine

## 2023-06-19 LAB — SURGICAL PATHOLOGY

## 2023-06-26 NOTE — Telephone Encounter (Signed)
Message sent to Ailene Ards, Practice Administrator, requesting [facility] Accounts Payable contact [place]/ [address] regarding the rent check

## 2023-06-29 ENCOUNTER — Other Ambulatory Visit: Payer: Self-pay

## 2023-06-29 ENCOUNTER — Other Ambulatory Visit: Payer: Self-pay | Admitting: Family Medicine

## 2023-06-29 DIAGNOSIS — I25110 Atherosclerotic heart disease of native coronary artery with unstable angina pectoris: Secondary | ICD-10-CM

## 2023-06-29 MED ORDER — CLOPIDOGREL BISULFATE 75 MG PO TABS
75.0000 mg | ORAL_TABLET | Freq: Every day | ORAL | 1 refills | Status: DC
Start: 2023-06-29 — End: 2023-12-27
  Filled 2023-06-29: qty 30, 30d supply, fill #0
  Filled 2023-07-31 (×2): qty 30, 30d supply, fill #1
  Filled 2023-08-30: qty 30, 30d supply, fill #2
  Filled 2023-09-26: qty 30, 30d supply, fill #3
  Filled 2023-10-26: qty 30, 30d supply, fill #4
  Filled 2023-11-27: qty 30, 30d supply, fill #5

## 2023-07-06 ENCOUNTER — Other Ambulatory Visit: Payer: Self-pay

## 2023-07-11 NOTE — Telephone Encounter (Signed)
Message received from [employer] / Accounts Payable that a check cannot be issued to the rental company without a W9  but they could request the check to the patient but she would have to complete a W9.   Per [facility], we cannot cut a check to the patient.    I spoke to [employer] again and she said they only accept money orders or cashier's checks.  She said they have received payments from other hospitals and churches through these methods.  I asked if there was someone else with the property management company that I can speak to so we can resolve this issue.  She said she will check into that and call me back.  She said they do not accept credit cards.  I sent an email to Ailene Ards, [employer] and [employer] with an update from my call to [facility] today requesting assistance with resolving this issue so payment can be made for patient's rent as approved by [employer].

## 2023-07-13 ENCOUNTER — Other Ambulatory Visit: Payer: Self-pay

## 2023-07-19 NOTE — Telephone Encounter (Signed)
[employer] [employer] checked the Tax ID number again as well as information about [employer] and [employer] and she stated they have no information about those properties and are unable to issue a check.  Cashier's check, money order and credit card are not options.    I called the patient to provide her with an update on the status of this check request and I had to leave a message requesting a call back.  I then called Heloise Ochoa, property manager and explained that [facility] is willing to issue the check but they absolutely need a W-9.  She said that their regional property manager was in her office and I asked that she check with that manger. She checked and then told me that she would be sending me information. She confirmed my email.  I asked if it was a W-9 and she said yes.   I am currently waiting for that information

## 2023-07-19 NOTE — Telephone Encounter (Signed)
W-9 received from [facility] and forwarded to [employer]/ Accounts Payable and Futures trader.

## 2023-07-23 ENCOUNTER — Telehealth: Payer: Self-pay | Admitting: Family Medicine

## 2023-07-23 NOTE — Telephone Encounter (Signed)
I called the patient and informed her that I have been back and forth with Asher Muir [place]/ [address] in attempts to obtain documentation that [employer] needs in order to pay the $700 rent and I was finally able to obtain that documentation on 07/19/2023.  We will now will submit the check request again and hopefully there will not be any problems with making that payment. She was very appreciative of the assistance.

## 2023-07-23 NOTE — Telephone Encounter (Signed)
Copied from CRM [id number]. Topic: General - Inquiry >> Jul 23, 2023 11:35 AM De Blanch wrote: Reason for CRM: Pt stated she is returning a missed call from Robyne Peers, RN, from last week Thursday.  Please advise.

## 2023-07-26 NOTE — Telephone Encounter (Signed)
Accounts Payable has received the check request for the rent and Amy Chaney has the back up documentation needed to process the request.

## 2023-07-31 ENCOUNTER — Other Ambulatory Visit: Payer: Self-pay

## 2023-07-31 ENCOUNTER — Other Ambulatory Visit: Payer: Self-pay | Admitting: Family Medicine

## 2023-07-31 MED ORDER — DEXCOM G7 SENSOR MISC
6 refills | Status: DC
  Filled 2023-07-31: qty 3, fill #0
  Filled 2023-08-10: qty 3, 30d supply, fill #0
  Filled 2023-09-26: qty 3, 30d supply, fill #1
  Filled 2023-10-26: qty 3, 30d supply, fill #2
  Filled 2023-11-28: qty 3, 30d supply, fill #3
  Filled 2023-12-27: qty 3, 30d supply, fill #4
  Filled 2024-01-25: qty 3, 30d supply, fill #5
  Filled 2024-03-07: qty 3, 30d supply, fill #6

## 2023-07-31 MED ORDER — TRULICITY 1.5 MG/0.5ML ~~LOC~~ SOAJ
1.5000 mg | SUBCUTANEOUS | 1 refills | Status: DC
Start: 2023-07-31 — End: 2024-01-30
  Filled 2023-07-31: qty 2, 28d supply, fill #0
  Filled 2023-08-30: qty 2, 28d supply, fill #1
  Filled 2023-09-28: qty 2, 28d supply, fill #2
  Filled 2023-10-26 – 2023-11-27 (×2): qty 2, 28d supply, fill #3
  Filled 2024-01-04: qty 2, 28d supply, fill #4

## 2023-08-06 ENCOUNTER — Other Ambulatory Visit: Payer: Self-pay

## 2023-08-07 NOTE — Telephone Encounter (Signed)
 Per [employer], Acct Payable, check # [id number] was mailed 07/26/2023 as requested.

## 2023-08-10 ENCOUNTER — Other Ambulatory Visit: Payer: Self-pay

## 2023-08-10 ENCOUNTER — Telehealth: Payer: Self-pay

## 2023-08-10 NOTE — Telephone Encounter (Signed)
 Pharmacy Patient Advocate Encounter   Received notification from CoverMyMeds that prior authorization for [facility] G7 SENSOR is required/requested.   Insurance verification completed.   The patient is insured through [facility] .   Per test claim: PA required; PA submitted to above mentioned insurance via CoverMyMeds Key/confirmation #/EOC [facility] Status is pending

## 2023-08-13 ENCOUNTER — Other Ambulatory Visit: Payer: Self-pay

## 2023-08-14 ENCOUNTER — Other Ambulatory Visit: Payer: Self-pay

## 2023-08-14 NOTE — Telephone Encounter (Signed)
 Pharmacy Patient Advocate Encounter  Received notification from  [facility] health  that Prior Authorization for [facility] G7 PRODUCTS has been APPROVED from 08/14/2023 to 08/13/2024   PA #/Case ID/Reference #: [id number]

## 2023-08-22 ENCOUNTER — Other Ambulatory Visit: Payer: Self-pay

## 2023-08-23 ENCOUNTER — Other Ambulatory Visit: Payer: Self-pay

## 2023-08-24 ENCOUNTER — Other Ambulatory Visit: Payer: Self-pay

## 2023-08-27 ENCOUNTER — Other Ambulatory Visit: Payer: Self-pay

## 2023-08-28 ENCOUNTER — Other Ambulatory Visit: Payer: Self-pay

## 2023-08-30 ENCOUNTER — Other Ambulatory Visit: Payer: Self-pay

## 2023-08-31 ENCOUNTER — Other Ambulatory Visit: Payer: Self-pay

## 2023-09-02 IMAGING — MG MM DIGITAL SCREENING BILAT W/ TOMO AND CAD
6 of 10 series · 6 of 30 positions shown · non-contrast
Comparison: Previous exam(s).

CLINICAL DATA: Screening.

EXAM:
DIGITAL SCREENING BILATERAL MAMMOGRAM WITH TOMOSYNTHESIS AND CAD
TECHNIQUE: Bilateral screening digital craniocaudal and mediolateral oblique
mammograms were obtained. Bilateral screening digital breast
tomosynthesis was performed. The images were evaluated with
computer-aided detection.

[L MLO synth-2D (1 of 2)]
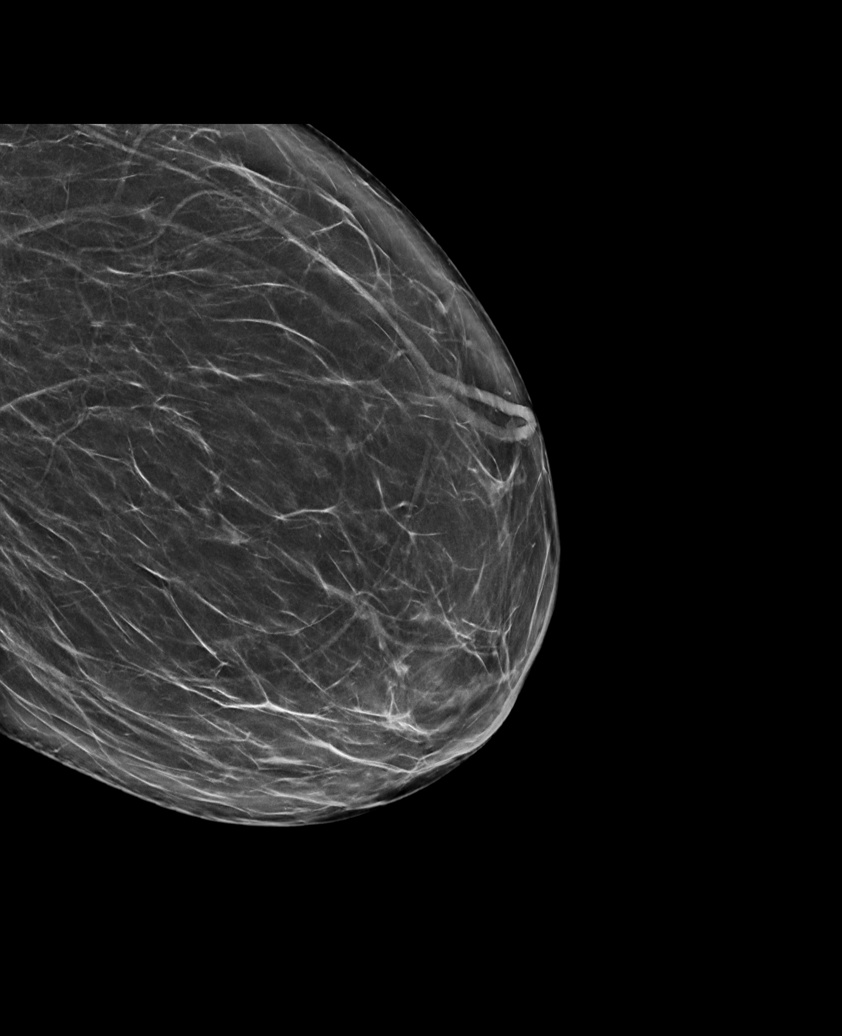

[L MLO synth-2D (2 of 2)]
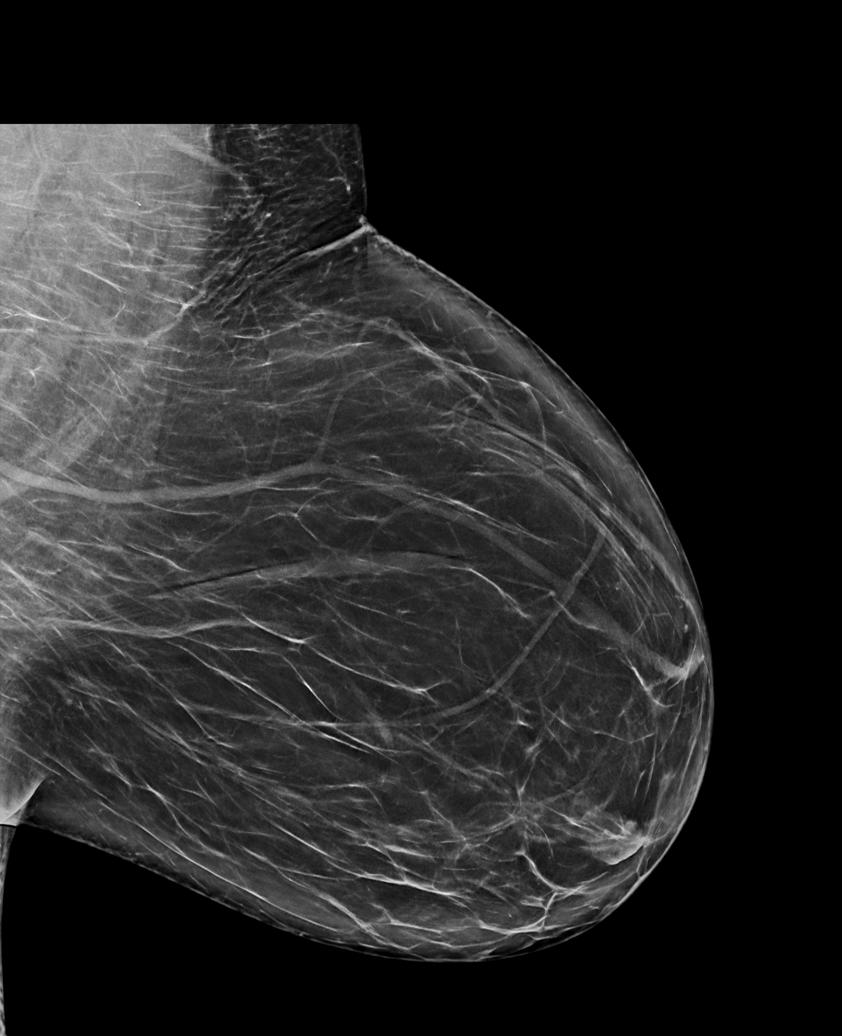

[R CC synth-2D]
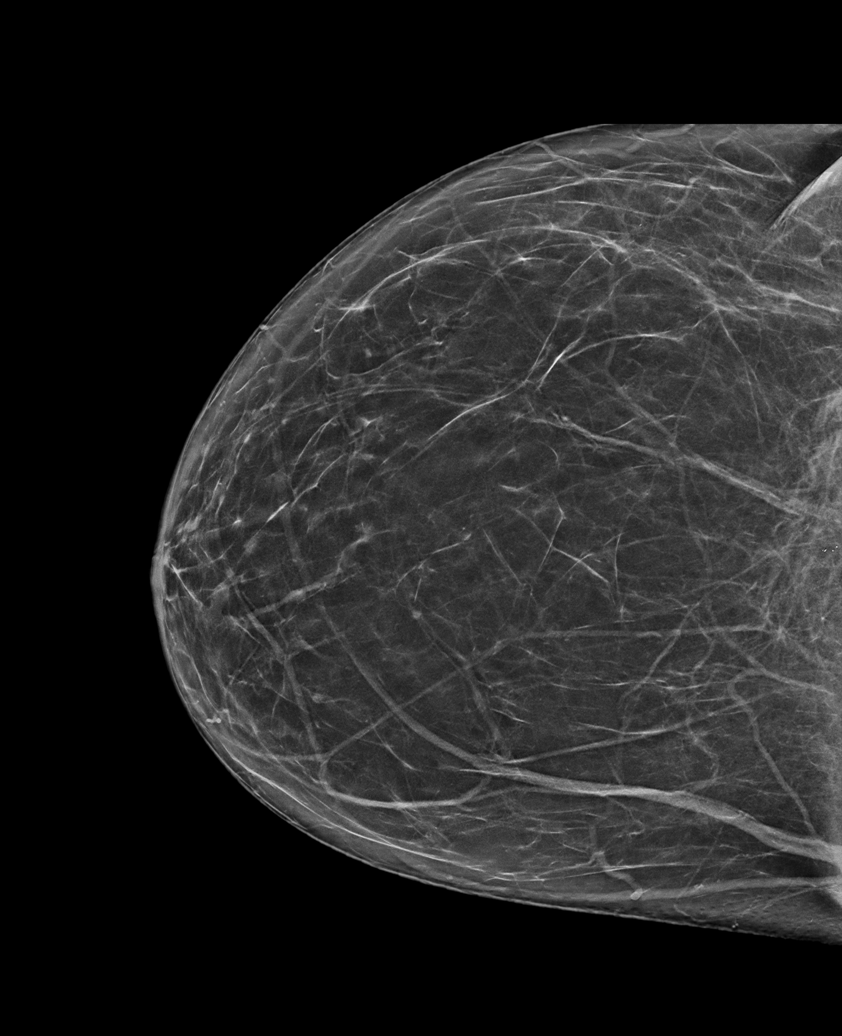

[L CC synth-2D]
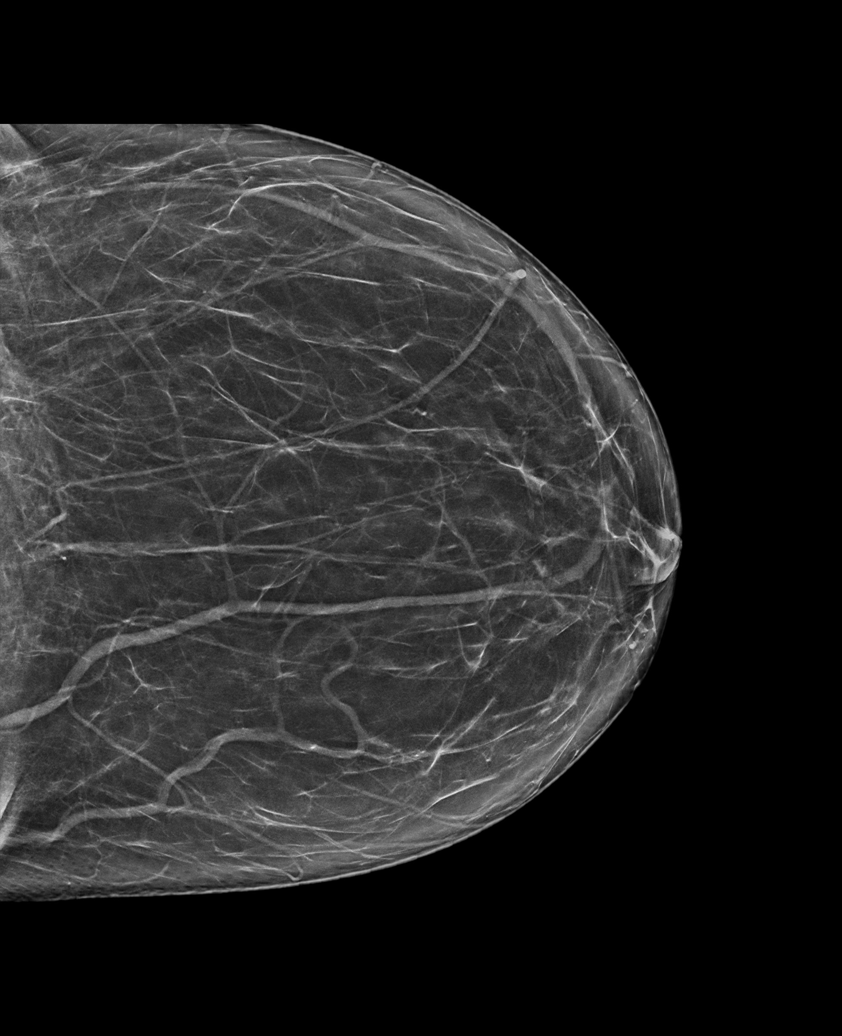

[R MLO synth-2D]
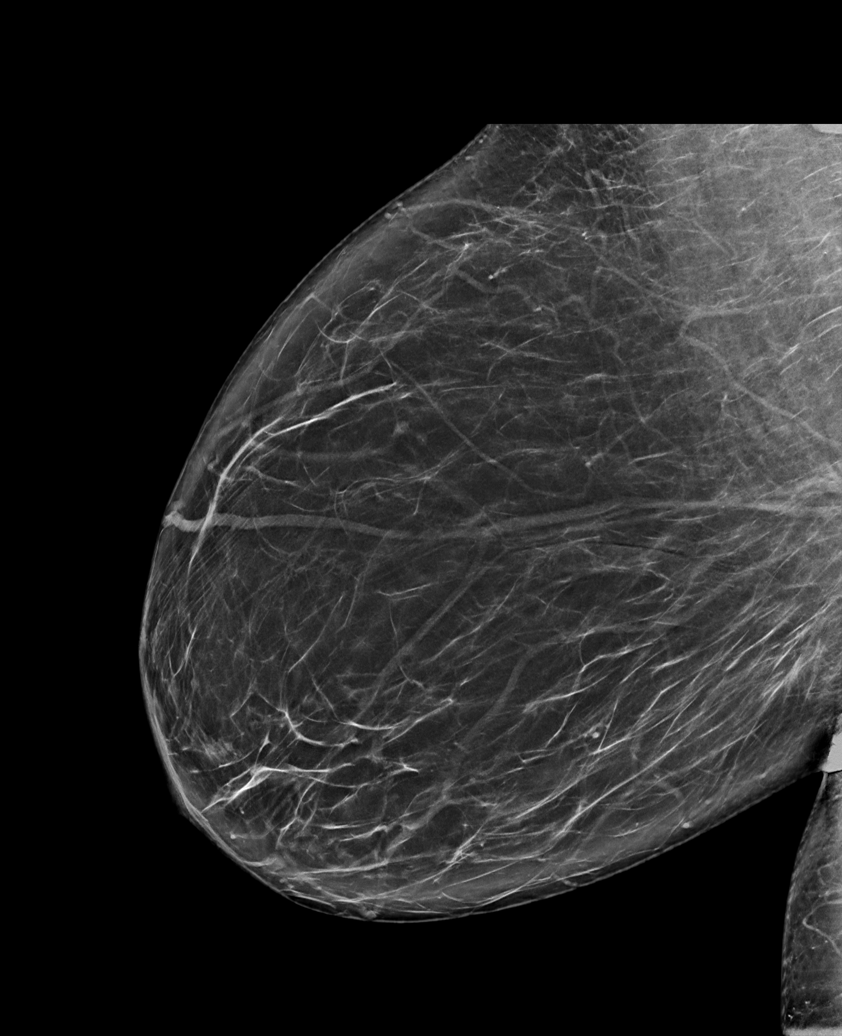

[L CC tomo · tomo slice 39/77.0]
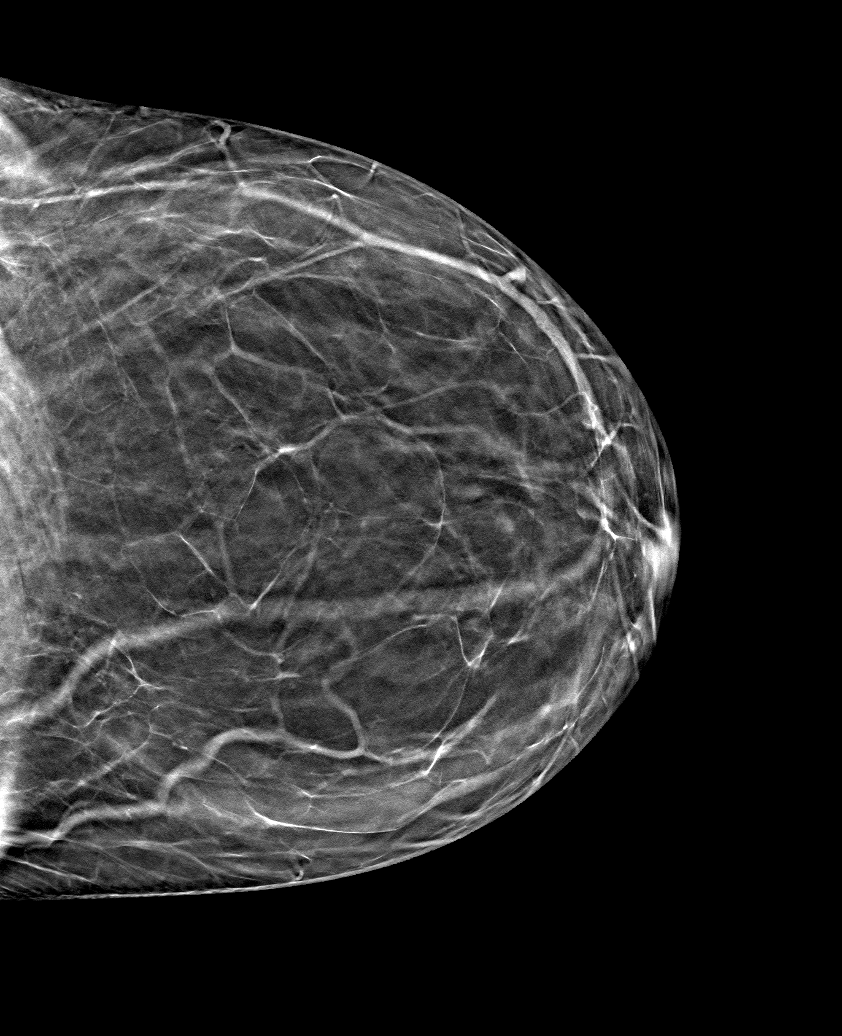

[6 of 30 positions shown; findings below may reference images not displayed]

ACR Breast Density Category b: There are scattered areas of
fibroglandular density.
FINDINGS: There are no findings suspicious for malignancy.
IMPRESSION: No mammographic evidence of malignancy. A result letter of this
screening mammogram will be mailed directly to the patient.

RECOMMENDATION:
Screening mammogram in one year. (Code:51-O-LD2)

BI-RADS CATEGORY  1: Negative.

## 2023-09-03 ENCOUNTER — Other Ambulatory Visit: Payer: Self-pay

## 2023-09-04 ENCOUNTER — Other Ambulatory Visit: Payer: Self-pay

## 2023-09-05 ENCOUNTER — Other Ambulatory Visit: Payer: Self-pay

## 2023-09-18 ENCOUNTER — Other Ambulatory Visit: Payer: Self-pay | Admitting: Family Medicine

## 2023-09-18 DIAGNOSIS — Z1231 Encounter for screening mammogram for malignant neoplasm of breast: Secondary | ICD-10-CM

## 2023-09-20 ENCOUNTER — Ambulatory Visit (INDEPENDENT_AMBULATORY_CARE_PROVIDER_SITE_OTHER): Payer: Medicaid Other

## 2023-09-20 ENCOUNTER — Ambulatory Visit: Payer: Self-pay | Admitting: Family Medicine

## 2023-09-20 ENCOUNTER — Other Ambulatory Visit: Payer: Self-pay

## 2023-09-20 ENCOUNTER — Ambulatory Visit (HOSPITAL_COMMUNITY)
Admission: EM | Admit: 2023-09-20 | Discharge: 2023-09-20 | Disposition: A | Discharge status: Home / Self Care | Payer: Medicaid Other | Attending: Emergency Medicine | Admitting: Emergency Medicine

## 2023-09-20 ENCOUNTER — Encounter (HOSPITAL_COMMUNITY): Payer: Self-pay

## 2023-09-20 DIAGNOSIS — R0602 Shortness of breath: Secondary | ICD-10-CM

## 2023-09-20 DIAGNOSIS — J101 Influenza due to other identified influenza virus with other respiratory manifestations: Secondary | ICD-10-CM

## 2023-09-20 LAB — POC COVID19/FLU A&B COMBO
Covid Antigen, POC: NEGATIVE
Influenza A Antigen, POC: POSITIVE — AB
Influenza B Antigen, POC: NEGATIVE

## 2023-09-20 MED ORDER — BENZONATATE 100 MG PO CAPS
100.0000 mg | ORAL_CAPSULE | Freq: Three times a day (TID) | ORAL | 0 refills | Status: DC
  Filled 2023-09-20: qty 21, 7d supply, fill #0

## 2023-09-20 NOTE — Telephone Encounter (Signed)
Copied from CRM [id number]. Topic: Clinical - Red Word Triage >> Sep 20, 2023  2:22 PM Amy Chaney wrote: Amy Chaney that prompted transfer to Nurse Triage: Patient has a lot of chest congestion//She has chest pain and her blood sugar has been dropping//Blood sugar 87  Chief Complaint: cough, congestion, pain, low bgl Symptoms: see above Frequency: constant Pertinent Negatives: Patient denies fever, chills, cp, sob (stats comes and goes at times with cough) Disposition: [] ED /[x] Urgent Care (no appt availability in office) / [] Appointment(In office/virtual)/ []  [place] [facility]/ [] Home Care/ [] Refused Recommended Disposition /[]  Mobile Bus/ []  Follow-up with PCP Additional Notes: no apt available; instructed to go to UC for treatment; care advice given, denies questions, pcp office updated.   Reason for Disposition  Cough has been present for > 3 weeks  Answer Assessment - Initial Assessment Questions 1. ONSET: "When did the cough begin?"      tuesday 2. SEVERITY: "How bad is the cough today?"      moderate 3. SPUTUM: "Describe the color of your sputum" (none, dry cough; clear, white, yellow, green)     denies 4. HEMOPTYSIS: "Are you coughing up any blood?" If so ask: "How much?" (flecks, streaks, tablespoons, etc.)     denies 5. DIFFICULTY BREATHING: "Are you having difficulty breathing?" If Yes, ask: "How bad is it?" (e.g., mild, moderate, severe)    - MILD: No SOB at rest, mild SOB with walking, speaks normally in sentences, can lie down, no retractions, pulse < 100.    - MODERATE: SOB at rest, SOB with minimal exertion and prefers to sit, cannot lie down flat, speaks in phrases, mild retractions, audible wheezing, pulse 100-120.    - SEVERE: Very SOB at rest, speaks in single words, struggling to breathe, sitting hunched forward, retractions, pulse > 120      "Yes and no, it comes and goes" 6. FEVER: "Do you have a fever?" If Yes, ask: "What is your temperature, how was  it measured, and when did it start?"     Not able to check 7. CARDIAC HISTORY: "Do you have any history of heart disease?" (e.g., heart attack, congestive heart failure)      DM, heart failure, htn 8. LUNG HISTORY: "Do you have any history of lung disease?"  (e.g., pulmonary embolus, asthma, emphysema)     smoker 9. PE RISK FACTORS: "Do you have a history of blood clots?" (or: recent major surgery, recent prolonged travel, bedridden)     denies 10. OTHER SYMPTOMS: "Do you have any other symptoms?" (e.g., runny nose, wheezing, chest pain)       Runny nose,  11. PREGNANCY: "Is there any chance you are pregnant?" "When was your last menstrual period?"       na 12. TRAVEL: "Have you traveled out of the country in the last month?" (e.g., travel history, exposures)       na  Protocols used: Cough - Acute Non-Productive-A-AH

## 2023-09-20 NOTE — ED Provider Notes (Signed)
[facility]    CSN: [id number] Arrival date & time: 09/20/23  1438      History   Chief Complaint Chief Complaint  Patient presents with   Cough    HPI Amy Chaney is a 53 y.o. female.   Patient presents with cough, congestion, runny nose, chills, body aches, chest discomfort, and shortness of breath that began on 2/17.  Patient reports she has been taking DayQuil, NyQuil, Alka-Seltzer, and Aleve with some relief.  Patient also states that she wants to rule out fluid on her lungs.  Patient has history of CHF and is prescribed Lasix.  Patient states she has not been taking her Lasix for the past week.  Patient denies running out of Lasix, but just stated that she has not been taking it this week.  Denies leg swelling.   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 s/p PCI of the mid RCA 01/2013 //  b. LHC 9/14: EF 55%, RCA stent 100% occluded, LAD irregularities >>  subacute stent thrombosis, Promus DES PCI distal overlap // c) 09/2017 - Ant STEMI - LAD-D2 PCI - Successful PCI of LAD (3 overlapping DES), unable to restore flow down D2l that was stented as well.  Likely related to downstream dissection, but unable to rewire.   Daily headache    Depression    Dyslipidemia, goal LDL below 70    History of Doppler ultrasound    carotid bruit >> a. 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  EF down to 20-25% global hypokinesis.  Was in the setting of grieving over the loss of her son from MI.  ->  EF improved to 45-50% on follow-up echocardiogram.   Hypertension    Ischemic cardiomyopathy 06/2018   s/p inferior STEMI followed by 2 anterior STEMIs: Following recovery from Takotsubo Cardiomyopathy- Echo 02/14/19: Moderate LVH.  EF improved up  to 45-50%.  GR 1 DD.  Severe apical akinesis.  Normal valves.   Mild dysplasia of cervix (CIN I) 02/04/2019   Seen after colposcopy on 01/23/19 done for ASCU +HPV pap  > repeat pap and HPV test in 12 and 24 months   STEMI involving left anterior descending coronary artery (HCC) 09/2017; 06/2018   a) Severe Medina 1, 1, 1 LAD-D2 lesion (complicated by post PTCA dissection/intramural hematoma) -successful extensive PCI of the LAD but unable to maintain patency of the stented D2. b) distal mLAD stent Edge 100% thrombosis --> PTCA & Overlapping DES PCI (Synergy 3 x 12 --> 3.6-3.3 mm). D2 occluded. RCA stents patent, 65 % OM2. EF 30-35%.   STEMI involving oth coronary artery of inferior wall (HCC) 03/2013   Secondary to subacute stent thrombosis of RCA stent segment having missed 4 doses of Effient.   Tobacco abuse 01/11/2016   Type II diabetes mellitus (HCC) 12/2010   sister and son also diabetic     Patient Active Problem List   Diagnosis Date Noted   Cardiomyopathy, ischemic 02/14/2021   Acute pyelonephritis 07/21/2020   Pyelonephritis 07/20/2020   Trichomoniasis 03/30/2019   Mild dysplasia of cervix (CIN I) 02/04/2019   Screening breast examination 01/16/2019   Pulmonary edema 12/18/2018   Atypical squamous cell changes of undetermined significance (ASCUS) on cervical cytology with positive high risk  human papilloma virus (HPV) 11/21/2018   Dyslipidemia, goal LDL below 70    Acute ST elevation myocardial infarction (STEMI) involving left anterior descending (LAD) coronary artery (HCC) 07/03/2018   Dark stools 04/03/2018   Hypertensive emergency 12/30/2017   STEMI involving left anterior descending coronary artery (HCC) 10/10/2017   MI, acute, non ST segment elevation (HCC)    Carotid artery disease (HCC) 01/11/2016   Prolonged Q-T interval on ECG 01/11/2016   Tobacco abuse counseling 01/11/2016   Heart palpitations 01/11/2016   Tobacco abuse 01/11/2016   Morbid obesity (HCC): BMI ~36 with  HTN, HLD, CAD 11/25/2015   Smoker 03/03/2015   Coronary artery disease involving native coronary artery of native heart with angina pectoris (HCC) 12/09/2014   Chronic combined systolic (congestive) and diastolic (congestive) heart failure (HCC) 12/09/2014   H/O noncompliance with medical treatment, presenting hazards to health 12/09/2014   S/P primary angioplasty with coronary stent 11/18/2014   Diabetes type 2, uncontrolled 04/01/2013   Hyperlipidemia associated with type 2 diabetes mellitus (HCC) 04/01/2013   Depression 04/01/2013   Stress at home 04/01/2013   Hypertensive heart disease with chronic combined systolic and diastolic congestive heart failure (HCC) 09/05/2011   Type II diabetes mellitus (HCC) 12/30/2010    Past Surgical History:  Procedure Laterality Date   CHOLECYSTECTOMY  ~ 2000   CORONARY STENT INTERVENTION N/A 10/10/2017   Procedure: CORONARY STENT INTERVENTION;  Surgeon: Marykay Lex, MD;  [facility]: [facility];  Service: Cardiovascular; LAD PCI: STENT SYNERGY DES 3.5X32 (p),STENT SYNERGY DES 3.5X 8 (m), STENT SYNERGY DES 3.5X28 (d).  D2 PCI: STENT SYNERGY DES 2.5X24.  (UNABL TO REWIRE & EXPAND - TO OF DIAG AT END OF CASE)   CORONARY STENT INTERVENTION N/A 07/03/2018   Procedure: CORONARY STENT INTERVENTION;  Surgeon: Marykay Lex, MD;  [facility]: [facility];; PTCA & overlapping DES PCI (overlaps prior stents distally) - Synergy DES 3 x 12 (3.6 mm @ overlap -- 3.3 mm distally)>   LEFT HEART CATH AND CORONARY ANGIOGRAPHY N/A 10/10/2017   Procedure: LEFT HEART CATH AND CORONARY ANGIOGRAPHY;  Surgeon: Marykay Lex, MD;  Location: [facility] INVASIVE CV LAB;  Service: Cardiovascular;  Laterality: N/A; -patent RCA stents with mild in-stent restenosis. Medina 1,1,1 mLD-D2 90% (complicated by dissection/intramural thrombus)   LEFT HEART CATH AND CORONARY ANGIOGRAPHY N/A 07/03/2018   Procedure: LEFT HEART CATH AND CORONARY ANGIOGRAPHY;  Surgeon: Marykay Lex,  MD;  Location: [facility] INVASIVE CV LAB;; 100% thrombotic occlusion distal LAD stent -- DES PCI. continued 100% D2 (previously lost). patent RCA stents, 65% OM2.  EF 30-35%.   LEFT HEART CATHETERIZATION WITH CORONARY ANGIOGRAM N/A 02/25/2013   Procedure: LEFT HEART CATHETERIZATION WITH CORONARY ANGIOGRAM;  Surgeon: Pamella Pert, MD;  Location: [facility] CATH LAB;  Service: Cardiovascular;; CTO m RCA; otherwise normal coronaries   LEFT HEART CATHETERIZATION WITH CORONARY ANGIOGRAM N/A 04/01/2013   Procedure: LEFT HEART CATHETERIZATION WITH CORONARY ANGIOGRAM;  Surgeon: Pamella Pert, MD;  Location: [facility] CATH LAB;  Service: Cardiovascular: 100% occlusion of distal RCA stent (subacute stent thrombosis -  secondary to stopping Effient).  Overlapping DES PCI   NM MYOVIEW LTD  12/2015    EF 38%.  Hypertensive response to exercise (231/132 mmHg).  INTERMEDIATE RISK due to -diffuse hypokinesis and reduced EF.  No ischemia or infarction.   PERCUTANEOUS CORONARY STENT INTERVENTION (PCI-S)  04/01/2013   Procedure: PERCUTANEOUS CORONARY STENT INTERVENTION (PCI-S);  Surgeon: Pamella Pert, MD;  Location: [facility] CATH  LAB;  Service: Cardiovascular;; PCI of distal RCA stent subacute thrombosis: Promus Premier DES 3.0 mm x 20 mm.   PERCUTANEOUS CORONARY STENT INTERVENTION (PCI-S)  02/25/2013   Procedure: PERCUTANEOUS CORONARY STENT INTERVENTION (PCI-S);  Surgeon: Pamella Pert, MD;  Location: [facility] CATH LAB;  RCA CTO PCI with overlapping Promus DES: 3.0 mm x 38 mm, 3.5 mm x 16mm   RIGHT/LEFT HEART CATH AND CORONARY ANGIOGRAPHY N/A 11/04/2019   Procedure: RIGHT/LEFT HEART CATH AND CORONARY ANGIOGRAPHY;  Surgeon: Marykay Lex, MD;  Location: [facility] INVASIVE CV LAB;; Mildly elevated LVEDP.  Ostial D1 100%.  Proximal to mid LAD stent 10% ISR.  Mid and distal stent widely patent.  30% distal distance.  Ostial OM 2 55%.  Proximal to mid RCA stent 50% stenosis.  Mid to distal RCA 40%.  Suspect that reduced EF is related to hypertensive  emergen   TRANSTHORACIC ECHOCARDIOGRAM  07/04/2018   recurrent Anterior STEMI: EF 35-40%.  Apical hypokinesis.  GRII DD.  No thrombus noted.  (Improved EF from 30% up to 35-40% from previous echo)   TRANSTHORACIC ECHOCARDIOGRAM  11/2018; 01/2019   a) TAKOTUSBO CM: EF 20-25%-diffuse HK..  Mod V thickness.  GRII DD w/ high LVEDP.  Severe LA dilation;; b) 02/14/19: Moderate LVH.  EF improved up to 45-50%.  GR 1 DD.  Severe apical akinesis.  Normal valves.   TRANSTHORACIC ECHOCARDIOGRAM  10/31/2019   Hypertensive emergency, hypoxic/hypercapnic respiratory failure;  EF 20 to 25%.  Global HK.  Mildly dilated LV.  Mildly elevated PA pressures.  Mild LA dilation.  Mild AR.  Mildly elevated RAP-8 mmHg.=> Eventual cath showed no change   TUBAL LIGATION  1994    OB History     Gravida  3   Para  3   Term  3   Preterm  0   AB  0   Living  3      SAB  0   IAB  0   Ectopic  0   Multiple  0   Live Births               Home Medications    Prior to Admission medications   Medication Sig Start Date End Date Taking? Authorizing Provider  benzonatate (TESSALON) 100 MG capsule Take 1 capsule (100 mg total) by mouth every 8 (eight) hours. 09/20/23  Yes Susann Givens, Ura Hausen A, NP  carvedilol (COREG) 25 MG tablet Take 1 tablet (25 mg total) by mouth 2 (two) times daily. 05/10/23 11/06/23  Hoy Register, MD  clopidogrel (PLAVIX) 75 MG tablet Take 1 tablet (75 mg total) by mouth daily. 06/29/23   Hoy Register, MD  Continuous Glucose Sensor (DEXCOM G7 SENSOR) MISC Use to check blood sugar throughout the day. Change sensors once every 10 days. E11.65 07/31/23   Hoy Register, MD  Dulaglutide (TRULICITY) 1.5 MG/0.5ML SOAJ Inject 1.5 mg into the skin once a week. 07/31/23   Hoy Register, MD  famotidine (PEPCID) 20 MG tablet Take 1 tablet (20 mg total) by mouth at bedtime. 06/05/23   Hoy Register, MD  hydrALAZINE (APRESOLINE) 25 MG tablet Take 1 tablet (25 mg total) by mouth in the morning and  at bedtime. 04/03/23   Hoy Register, MD  insulin glargine (LANTUS SOLOSTAR) 100 UNIT/ML Solostar Pen Inject 20 Units into the skin daily. 06/05/23   Hoy Register, MD  Insulin Pen Needle (TECHLITE PEN NEEDLES) 32G X 4 MM MISC Use 1 pen needle with insulin once daily 11/07/22  Hoy Register, MD  Multiple Vitamins-Minerals (WOMENS 50+ MULTI VITAMIN PO) Take 1 tablet by mouth daily.    [provider]  nitroGLYCERIN (NITROSTAT) 0.4 MG SL tablet Place 1 tablet (0.4 mg total) under the tongue every 5 (five) minutes as needed for chest pain. Reported on 11/25/2015 07/06/18   Manson Passey, PA  rosuvastatin (CRESTOR) 40 MG tablet Take 1 tablet (40 mg total) by mouth daily. 05/10/23   Hoy Register, MD  sacubitril-valsartan (ENTRESTO) 97-103 MG Take 1 tablet by mouth 2 (two) times daily. 05/29/23   Marykay Lex, MD  spironolactone (ALDACTONE) 25 MG tablet Take 0.5 tablets (12.5 mg total) by mouth daily. 10/04/22 10/04/23  Anders Simmonds, PA-C  TRUEPLUS LANCETS 28G MISC 28 g by Does not apply route 4 (four) times daily. 07/06/18   Rai, Ripudeep Kirtland Bouchard, MD  buPROPion (WELLBUTRIN SR) 150 MG 12 hr tablet Take 1 tablet (150 mg total) by mouth 2 (two) times daily. 11/17/19 07/20/20  Hoy Register, MD    Family History Family History  Problem Relation Age of Onset   Diabetes Mother    Diabetes Sister    Diabetes Sister    Diabetes Son        type 1   Liver disease Neg Hx    Colon cancer Neg Hx    Esophageal cancer Neg Hx     Social History Social History   Tobacco Use   Smoking status: Every Day    Current packs/day: 0.25    Average packs/day: 0.3 packs/day for 22.0 years (5.5 ttl pk-yrs)    Types: Cigarettes   Smokeless tobacco: Never   Tobacco comments:    current smoker 4-5 cigarettes per day; previously smoked at least a pack a day.        6 AM cigarette-06-15-23  Vaping Use   Vaping status: Never Used  Substance Use Topics   Alcohol use: Not Currently    Comment:  07/04/2018 "only on special occasions; maybe once/yr"   Drug use: Not Currently    Types: Marijuana    Comment: 02/25/2013 "quit marijuana ~ 2001"     Allergies   Farxiga [dapagliflozin]   Review of Systems Review of Systems  Respiratory:  Positive for cough.    Per HPI  Physical Exam Triage Vital Signs ED Triage Vitals  Encounter Vitals Group     BP 09/20/23 1545 117/74     Systolic BP Percentile --      Diastolic BP Percentile --      Pulse Rate 09/20/23 1545 78     Resp 09/20/23 1545 16     Temp 09/20/23 1545 97.6 F (36.4 C)     Temp Source 09/20/23 1545 Oral     SpO2 09/20/23 1545 99 %     Weight --      Height 09/20/23 1546 5\' 9"  (1.753 m)     Head Circumference --      Peak Flow --      Pain Score 09/20/23 1548 6     Pain Loc --      Pain Education --      Exclude from Growth Chart --    No data found.  Updated Vital Signs BP 117/74 (BP Location: Right Arm)   Pulse 78   Temp 97.6 F (36.4 C) (Oral)   Resp 16   Ht 5\' 9"  (1.753 m)   LMP 10/20/2015   SpO2 99%   BMI 32.05 kg/m   Visual Acuity Right Eye  Distance:   Left Eye Distance:   Bilateral Distance:    Right Eye Near:   Left Eye Near:    Bilateral Near:     Physical Exam Vitals and nursing note reviewed.  Constitutional:      General: She is awake. She is not in acute distress.    Appearance: Normal appearance. She is well-developed and well-groomed. She is not ill-appearing.  HENT:     Right Ear: Tympanic membrane, ear canal and external ear normal.     Left Ear: Tympanic membrane, ear canal and external ear normal.     Nose: Congestion and rhinorrhea present.     Mouth/Throat:     Mouth: Mucous membranes are moist.     Pharynx: Posterior oropharyngeal erythema present. No oropharyngeal exudate.  Cardiovascular:     Rate and Rhythm: Normal rate and regular rhythm.  Pulmonary:     Effort: Pulmonary effort is normal.     Breath sounds: Normal breath sounds.  Musculoskeletal:         General: Normal range of motion.  Skin:    General: Skin is warm and dry.  Neurological:     Mental Status: She is alert.  Psychiatric:        Behavior: Behavior is cooperative.      UC Treatments / Results  Labs (all labs ordered are listed, but only abnormal results are displayed) Labs Reviewed  POC COVID19/FLU A&B COMBO - Abnormal; Notable for the following components:      Result Value   Influenza A Antigen, POC Positive (*)    All other components within normal limits    EKG   Radiology DG Chest 2 View Result Date: 09/20/2023 CLINICAL DATA:  Shortness of breath EXAM: CHEST - 2 VIEW COMPARISON:  07/06/2022 FINDINGS: The heart size and mediastinal contours are within normal limits. Both lungs are clear. The visualized skeletal structures are unremarkable. IMPRESSION: No active cardiopulmonary disease. Electronically Signed   By: Jasmine Pang M.D.   On: 09/20/2023 17:21    Procedures Procedures (including critical care time)  Medications Ordered in UC Medications - No data to display  Initial Impression / Assessment and Plan / UC Course  I have reviewed the triage vital signs and the nursing notes.  Pertinent labs & imaging results that were available during my care of the patient were reviewed by me and considered in my medical decision making (see chart for details).     Patient presented with 4-day history of cough, congestion, runny nose, chills, body aches, chest discomfort, and shortness of breath.  Patient also requesting a chest x-ray to rule out fluid on her lungs.  History of CHF and prescribed Lasix, but patient denies taking Lasix for the past week.  Upon assessment congestion and rhinorrhea are present, mild erythema noted to pharynx.  Lungs clear bilaterally on auscultation.  Chest x-ray ordered per patient request.  Chest x-ray revealed no active cardiopulmonary disease.  Tested positive for influenza A.  Prescribed Tessalon as needed for cough.   Discussed over-the-counter medications for influenza related symptoms.  Discussed importance of restarting her Lasix.  Discussed return precautions. Final Clinical Impressions(s) / UC Diagnoses   Final diagnoses:  Shortness of breath  Influenza A     Discharge Instructions      You tested positive for influenza A today. Your chest X-ray was negative for fluid on your lungs and pneumonia. I have prescribed Tessalon that you can take every 8 hours as needed for cough.  You can alternate between ibuprofen and Tylenol as needed for body aches and fever. I recommend Mucinex as needed for cough and congestion. Stay hydrated and get plenty of rest. Return here if symptoms persist or worsen.      ED Prescriptions     Medication Sig Dispense Auth. Provider   benzonatate (TESSALON) 100 MG capsule Take 1 capsule (100 mg total) by mouth every 8 (eight) hours. 21 capsule Wynonia Lawman A, NP      PDMP not reviewed this encounter.   Wynonia Lawman A, NP 09/20/23 [phone]

## 2023-09-20 NOTE — ED Triage Notes (Signed)
Patient here today with c/o cough, runny nose, sweats, chills, body aches, chest discomfort, and SOB X 3 days. She has been taking Dayquil, Nyquil, Alka Seltzer, and Aleve with some relief. 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

## 2023-09-20 NOTE — Discharge Instructions (Signed)
You tested positive for influenza A today. Your chest X-ray was negative for fluid on your lungs and pneumonia. I have prescribed Tessalon that you can take every 8 hours as needed for cough. You can alternate between ibuprofen and Tylenol as needed for body aches and fever. I recommend Mucinex as needed for cough and congestion. Stay hydrated and get plenty of rest. Return here if symptoms persist or worsen.

## 2023-09-20 NOTE — Telephone Encounter (Signed)
 noted

## 2023-09-21 ENCOUNTER — Other Ambulatory Visit: Payer: Self-pay

## 2023-09-26 ENCOUNTER — Other Ambulatory Visit: Payer: Self-pay | Admitting: Family Medicine

## 2023-09-26 ENCOUNTER — Other Ambulatory Visit: Payer: Self-pay

## 2023-09-26 ENCOUNTER — Other Ambulatory Visit: Payer: Self-pay | Admitting: Physician Assistant

## 2023-09-26 DIAGNOSIS — I10 Essential (primary) hypertension: Secondary | ICD-10-CM

## 2023-09-26 MED ORDER — SPIRONOLACTONE 25 MG PO TABS
12.5000 mg | ORAL_TABLET | Freq: Every day | ORAL | 0 refills | Status: DC
  Filled 2023-09-26: qty 15, 30d supply, fill #0

## 2023-09-26 MED ORDER — HYDRALAZINE HCL 25 MG PO TABS
25.0000 mg | ORAL_TABLET | Freq: Two times a day (BID) | ORAL | 0 refills | Status: DC
  Filled 2023-09-26: qty 60, 30d supply, fill #0

## 2023-09-27 ENCOUNTER — Other Ambulatory Visit: Payer: Self-pay

## 2023-09-28 ENCOUNTER — Other Ambulatory Visit: Payer: Self-pay

## 2023-10-11 ENCOUNTER — Ambulatory Visit
Admission: RE | Admit: 2023-10-11 | Discharge: 2023-10-11 | Disposition: A | Discharge status: Home / Self Care | Payer: 59 | Source: Ambulatory Visit | Attending: Family Medicine | Admitting: Family Medicine

## 2023-10-11 DIAGNOSIS — Z1231 Encounter for screening mammogram for malignant neoplasm of breast: Secondary | ICD-10-CM

## 2023-10-15 ENCOUNTER — Encounter: Payer: Self-pay | Admitting: Family Medicine

## 2023-10-26 ENCOUNTER — Other Ambulatory Visit: Payer: Self-pay | Admitting: Family Medicine

## 2023-10-26 ENCOUNTER — Other Ambulatory Visit: Payer: Self-pay

## 2023-10-26 DIAGNOSIS — I10 Essential (primary) hypertension: Secondary | ICD-10-CM

## 2023-10-29 ENCOUNTER — Encounter: Payer: Self-pay | Admitting: Cardiology

## 2023-10-29 ENCOUNTER — Ambulatory Visit: Payer: 59 | Attending: Cardiology | Admitting: Cardiology

## 2023-10-29 ENCOUNTER — Other Ambulatory Visit: Payer: Self-pay

## 2023-10-29 VITALS — BP 110/78 | HR 85 | Ht 69.0 in | Wt 218.6 lb

## 2023-10-29 DIAGNOSIS — I255 Ischemic cardiomyopathy: Secondary | ICD-10-CM

## 2023-10-29 DIAGNOSIS — R42 Dizziness and giddiness: Secondary | ICD-10-CM

## 2023-10-29 DIAGNOSIS — I5032 Chronic diastolic (congestive) heart failure: Secondary | ICD-10-CM

## 2023-10-29 DIAGNOSIS — Z720 Tobacco use: Secondary | ICD-10-CM

## 2023-10-29 DIAGNOSIS — E1169 Type 2 diabetes mellitus with other specified complication: Secondary | ICD-10-CM | POA: Diagnosis not present

## 2023-10-29 DIAGNOSIS — I110 Hypertensive heart disease with heart failure: Secondary | ICD-10-CM | POA: Diagnosis not present

## 2023-10-29 DIAGNOSIS — E785 Hyperlipidemia, unspecified: Secondary | ICD-10-CM

## 2023-10-29 DIAGNOSIS — I10 Essential (primary) hypertension: Secondary | ICD-10-CM | POA: Diagnosis not present

## 2023-10-29 DIAGNOSIS — I2102 ST elevation (STEMI) myocardial infarction involving left anterior descending coronary artery: Secondary | ICD-10-CM

## 2023-10-29 DIAGNOSIS — I2510 Atherosclerotic heart disease of native coronary artery without angina pectoris: Secondary | ICD-10-CM | POA: Diagnosis not present

## 2023-10-29 DIAGNOSIS — Z716 Tobacco abuse counseling: Secondary | ICD-10-CM

## 2023-10-29 DIAGNOSIS — I998 Other disorder of circulatory system: Secondary | ICD-10-CM

## 2023-10-29 DIAGNOSIS — M79606 Pain in leg, unspecified: Secondary | ICD-10-CM | POA: Insufficient documentation

## 2023-10-29 DIAGNOSIS — Z955 Presence of coronary angioplasty implant and graft: Secondary | ICD-10-CM

## 2023-10-29 MED ORDER — SPIRONOLACTONE 25 MG PO TABS
25.0000 mg | ORAL_TABLET | Freq: Every day | ORAL | 3 refills | Status: AC
  Filled 2023-10-29: qty 30, 30d supply, fill #0
  Filled 2023-11-27: qty 30, 30d supply, fill #1
  Filled 2023-12-27: qty 30, 30d supply, fill #2
  Filled 2024-01-25: qty 30, 30d supply, fill #3
  Filled 2024-02-22: qty 30, 30d supply, fill #4
  Filled 2024-03-28: qty 30, 30d supply, fill #5
  Filled 2024-04-25: qty 30, 30d supply, fill #6
  Filled 2024-05-23: qty 30, 30d supply, fill #7
  Filled 2024-06-27: qty 30, 30d supply, fill #8
  Filled 2024-07-22: qty 30, 30d supply, fill #9
  Filled 2024-08-29 (×2): qty 30, 30d supply, fill #10

## 2023-10-29 NOTE — Patient Instructions (Addendum)
 Medication Instructions:  Increase  Spironolactone to 25 mg  daily from 12.5 mg  Stop taking Hydralazine   *If you need a refill on your cardiac medications before your next appointment, please call your pharmacy*   Lab Work:  Nt needed   Testing/Procedures: Will be schedule  [address]  [address] or [address] after April 28 , 2025 Your physician has requested that you have an ankle brachial index (ABI). During this test an ultrasound and blood pressure cuff are used to evaluate the arteries that supply the arms and legs with blood. Allow thirty minutes for this exam. There are no restrictions or special instructions.  Please note: We ask at that you not bring children with you during ultrasound (echo/ vascular) testing. Due to room size and safety concerns, children are not allowed in the ultrasound rooms during exams. Our front office staff cannot provide observation of children in our lobby area while testing is being conducted. An adult accompanying a patient to their appointment will only be allowed in the ultrasound room at the discretion of the ultrasound technician under special circumstances. We apologize for any inconvenience.   And/or  Your physician has requested that you have a lower extremity arterial duplex. This test is an ultrasound of the arteries in the legs. It looks at arterial blood flow in the legs. Allow one hour for Lower  Arterial scans. There are no restrictions or special instructions.  Please note: We ask at that you not bring children with you during ultrasound (echo/ vascular) testing. Due to room size and safety concerns, children are not allowed in the ultrasound rooms during exams. Our front office staff cannot provide observation of children in our lobby area while testing is being conducted. An adult accompanying a patient to their appointment will only be allowed in the ultrasound room at the discretion of the ultrasound technician under special  circumstances. We apologize for any inconvenience.  Follow-Up: At [facility], you and your health needs are our priority.  As part of our continuing mission to provide you with exceptional heart care, we have created designated Provider Care Teams.  These Care Teams include your primary Cardiologist (physician) and Advanced Practice Providers (APPs -  Physician Assistants and Nurse Practitioners) who all work together to provide you with the care you need, when you need it.     Your next appointment:   6 month(s)  The format for your next appointment:   In Person  Provider:   Azalee Course PA   and Bryan Lemma, MD in 12 months   Other Instructions

## 2023-10-29 NOTE — Progress Notes (Incomplete)
 Cardiology Office Note:  .   Date:  10/29/2023  ID:  Amy Chaney, DOB 1970-09-21, MRN [id number] PCP: Amy Register, MD  [facility] Providers Cardiologist:  Amy Lemma, MD Cardiology APP:  Amy Chaney, [place] { Click to update primary MD,subspecialty MD or APP then REFRESH:1}    No chief complaint on file.   Patient Profile: .     Amy Chaney is an obese 53 y.o. female smoker with a PMH notable for MV CAD-PCI, HTN, HLD who presents here for 31-month follow-up at the request of Amy Register, MD.  I have not seen her since July 2022.    Amy Chaney was last seen on April 13, 2023 by Amy Course, PA.  Subjective  Discussed the use of AI scribe software for clinical note transcription with the patient, who gave verbal consent to proceed.  History of Present Illness   Amy Chaney is a 53 year old female with coronary artery disease who presents for follow-up of her cardiac condition.  She has a history of coronary artery disease with multiple interventions, including myocardial infarctions and stent placements in the right coronary artery and left anterior descending artery. Her first myocardial infarction occurred in 2017, with stent placements in 2014, 2017, and 2019. In 2014, her right coronary artery was completely occluded and opened with stents. In March 2019, a long area of disease in her anterior descending artery was stented, but a side branch was lost. She underwent another procedure a few months later to reopen a stent that had closed. Her last cardiac catheterization in 2021 showed open stents except for one closed side branch. An echocardiogram showed a left ventricular ejection fraction of 50-55% with hypokinesis at the apex.  She experiences dizziness twice daily, particularly in the morning when getting up and sometimes at work. She describes feeling lightheaded but does not experience syncope and manages this by sitting down when needed. No  palpitations, chest pain, or nocturnal dyspnea. She sleeps on one pillow and does not wake up due to breathing difficulties.  Her current medications include carvedilol 25 mg twice daily, Entresto 97/103 mg twice daily, spironolactone 12.5 mg, rosuvastatin 40 mg, and diabetes medications Trulicity and Basaglar. She has not been taking Repatha for some time and uses hydralazine as needed for hypertension.  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 She reports leg pain and dyspnea when climbing stairs, specifically by the third floor, but not during regular walking activities. No peripheral edema.       Objective    Studies Reviewed: Mar[facility] Kitchen        ECHO 02/2021: EF mildly reduced at 50 to 55% with apical akinesis.  GR 2 DD with elevated LAP and moderate LA dilation.  Mild MR.  Tricuspid aortic valve otherwise normal.  Normal RV size and function.  Normal PAP and RAP.  CATH 10/2019: Stable 3 V CAD- widely patent stents in LAD & RCA with known CTO of Diag branch & moderate OM disease. No culprit lesion to explain reduced LVEF Relatively normal RHC #s wth only mildly elevated LVEDP and PCWP despite systemic hypertension with blood pressures in the 170s systolic.   Lab Results  Component Value Date   CHOL 113 04/13/2023   HDL 44 04/13/2023   LDLCALC 51 04/13/2023   TRIG 97 04/13/2023   CHOLHDL 2.6 04/13/2023    Risk Assessment/Calculations:  Physical Exam:   VS:  BP 110/78 (BP Location: Right Arm, Patient Position: Sitting, Cuff Size: Normal)   Pulse 85   Ht 5\' 9"  (1.753 m)   Wt 218 lb 9.6 oz (99.2 kg)   LMP 10/20/2015   SpO2 98%   BMI 32.28 kg/m    Wt Readings from Last 3 Encounters:  10/29/23 218 lb 9.6 oz (99.2 kg)  06/15/23 217 lb (98.4 kg)  06/05/23 218 lb 12.8 oz (99.2 kg)    GEN: Well nourished, well developed in no acute distress; *** NECK: No JVD; No carotid  bruits CARDIAC: Normal S1, S2; RRR, no murmurs, rubs, gallops RESPIRATORY:  Clear to auscultation without rales, wheezing or rhonchi ; nonlabored, good air movement. ABDOMEN: Soft, non-tender, non-distended EXTREMITIES:  No edema; No deformity      ASSESSMENT AND PLAN: .    Problem List Items Addressed This Visit       Cardiology Problems   Cardiomyopathy, ischemic (Chronic)   Relevant Medications   spironolactone (ALDACTONE) 25 MG tablet   Coronary artery disease involving native coronary artery of native heart without angina pectoris (Chronic)   Relevant Medications   spironolactone (ALDACTONE) 25 MG tablet   Other Relevant Orders   VAS [place] ABI WITH/WO TBI   VAS [place] LOWER EXTREMITY ARTERIAL DUPLEX   Hyperlipidemia associated with type 2 diabetes mellitus (HCC) (Chronic)   Relevant Medications   spironolactone (ALDACTONE) 25 MG tablet   Hypertensive heart disease with chronic combined systolic and diastolic congestive heart failure (HCC) - Primary (Chronic)   Relevant Medications   spironolactone (ALDACTONE) 25 MG tablet     Other   Ischemic leg pain   Relevant Orders   VAS [place] ABI WITH/WO TBI   VAS [place] LOWER EXTREMITY ARTERIAL DUPLEX   S/P primary angioplasty with coronary stent (Chronic)   Tobacco abuse (Chronic)   Other Visit Diagnoses       Primary hypertension       Relevant Medications   spironolactone (ALDACTONE) 25 MG tablet       Assessment and Plan    Coronary artery disease with myocardial infarction and stent placement Multiple myocardial infarctions with stent placements in the right coronary artery and left anterior descending artery since 2014. Last cardiac catheterization in 2021 showed open stents except for one closed side branch. Echocardiogram revealed left ventricular ejection fraction of 50-55% with apical hypokinesis. Currently asymptomatic with no chest pain, dyspnea, or palpitations. Engages in regular physical activity without significant  limitations. Emphasized maintaining open stents to prevent further cardiac events. - Continue carvedilol 25 mg twice daily - Continue Entresto 97/103 mg twice daily - Continue Plavix as prescribed - Educate on lifestyle modifications including diet and exercise  Ischemic leg pain Leg pain and fatigue when climbing stairs, suggestive of peripheral artery disease. Differential includes claudication due to arterial insufficiency. Plan to assess for peripheral artery disease to prevent progression and manage symptoms effectively. - Order ankle-brachial index (ABI) to assess for peripheral artery disease - Consider lower extremity arterial ultrasound if ABI is abnormal  Dizziness Dizziness occurring twice daily, primarily in the morning and sometimes at work. Not associated with orthostatic changes. Suspected to be related to hydralazine use. Decision made to discontinue hydralazine to alleviate symptoms. - Discontinue hydralazine - Increase spironolactone to 25 mg daily - Instruct to monitor blood pressure and use hydralazine only if systolic BP >150 mmHg  Diabetes mellitus Type 2 diabetes managed with Trulicity and Hospital doctor. Coordination with primary care to ensure  comprehensive diabetes management. - Ensure primary care physician checks A1c and glucose levels during upcoming visit  Hyperlipidemia Hyperlipidemia managed with rosuvastatin. Repatha discontinued, and cholesterol levels not checked since September 2024. Emphasized maintaining optimal cholesterol levels to prevent further arterial blockages. - Check fasting cholesterol levels during upcoming primary care visit - Consider resuming Repatha if cholesterol levels are elevated  Follow-up Coordination with primary care and cardiology for comprehensive care. - Follow up with primary care physician on October 31, 2023, for blood work - Schedule follow-up with cardiology PA in 6 months - Schedule follow-up with cardiologist in 1  year  Recording duration: 18 minutes           {Are you ordering a CV Procedure (e.g. stress test, cath, DCCV, TEE, etc)?   Press F2        :[id number]}   Follow-Up: Return in about 6 months (around 04/29/2024).  Total time spent: *** min spent with patient + *** min spent charting = *** min    Signed, Marykay Lex, MD, MS Amy Chaney, M.D., M.S. Interventional Cardiologist  [facility]  Pager # [phone] Phone # [phone] [address] [place], [place] [address]

## 2023-10-29 NOTE — Progress Notes (Unsigned)
 Cardiology Office Note:  .   Date:  10/30/2023  ID:  [name], DOB [birth year], MRN [id number] PCP: Hoy Register, MD   [facility] Providers Cardiologist:  Bryan Lemma, MD Cardiology APP:  Azalee Course, [place]     Chief Complaint  Patient presents with   Follow-up    6 months   Coronary Artery Disease    No recurrent angina   Hypertension    BP seems stable.  If anything notes a little bit orthostatic dizziness.     Past Surgical History:  Procedure Laterality Date                                   RIGHT/LEFT HEART CATH AND CORONARY ANGIOGRAPHY N/A 11/04/2019   Procedure: RIGHT/LEFT HEART CATH AND CORONARY ANGIOGRAPHY;  Surgeon: Marykay Lex, MD;  Location: [facility] INVASIVE CV LAB;; Mildly elevated LVEDP.  Ostial D1 100%.  Proximal to mid LAD stent 10% ISR.  Mid and distal stent widely patent.  30% distal distance.  Ostial OM 2 55%.  Proximal to mid RCA stent 50% stenosis.  Mid to distal RCA 40%.  Suspect that reduced EF is related to hypertensive emergen   TRANSTHORACIC ECHOCARDIOGRAM  07/04/2018   recurrent Anterior STEMI: EF 35-40%.  Apical hypokinesis.  GRII DD.  No thrombus noted.  (Improved EF from 30% up to 35-40% from previous echo)   TRANSTHORACIC ECHOCARDIOGRAM  11/2018; 01/2019   a) TAKOTUSBO CM: EF 20-25%-diffuse HK..  Mod V thickness.  GRII DD w/ high LVEDP.  Severe LA dilation;; b) 02/14/19: Moderate LVH.  EF improved up to 45-50%.  GR 1 DD.  Severe apical akinesis.  Normal valves.   TRANSTHORACIC ECHOCARDIOGRAM  10/31/2019   Hypertensive emergency, hypoxic/hypercapnic respiratory failure;  EF 20 to 25%.  Global HK.  Mildly dilated LV.  Mildly elevated PA pressures.  Mild LA dilation.  Mild AR.  Mildly elevated RAP-8 mmHg.=> Eventual cath showed no change   TUBAL LIGATION  1994    Patient Profile: .     DOMINIK LAURICELLA is an obese 53 y.o. female smoker with a PMH notable for MV CAD-PCI, HTN, HLD who presents here for 29-month follow-up at the request of  Hoy Register, MD.  MV CAD: 01/2013 (Dr. Jacinto Halim): Abn Myoview => Cath => RCA 100% ~ CTO:  RCA CTO PCI with overlapping Promus DES: 3.0 mm x 38 mm, 3.5 mm x 16mm 03/2013 (Dr. Nadara Eaton): NSTEMI (4 d after stopping Effient)=> Cath: RCA subacute 100% IS Thrombosis: Promus Premier DES 3.0 mm x 20 mm. 09/2017 (Dr. Herbie Baltimore) Anterior STEMI: patent RCA stents with mild in-stent restenosis. [place] 1,1,1 mLD-D2 90% (complicated by dissection/intramural thrombus) =>  LAD PCI: STENT SYNERGY DES 3.5X32 (p),STENT SYNERGY DES 3.5X 8 (m), STENT SYNERGY DES 3.5X28 (d).  D2 PCI: STENT SYNERGY DES 2.5X24.  (UNABL TO REWIRE & EXPAND - TO OF DIAG AT END OF CASE) 06/2018 (Dr. Herbie Baltimore): Anterior STEMI: 100% thrombotic occlusion distal LAD stent -- DES PCI. continued 100% D2 (previously lost). patent RCA stents, 65% OM2.  EF 30-35%. 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  Proximal to mid LAD stent 10% ISR.  Mid and distal stent widely patent.  30% distal distance.  Ostial OM 2 55%.  Proximal to mid RCA stent 50% stenosis.  Mid to distal RCA 40%.  Suspect that reduced EF is related to Hypertensive Emergency   I have not seen her since July 2022.    ALISA STJAMES was last seen on April 13, 2023 by Azalee Course, PA: This is a delayed follow-up.  She was doing well from a cardiac standpoint with no chest pain or dyspnea.  Not very active.  Still smoking.  Still feeling somewhat down about her son passing, but enjoys visits with her daughter and grandkids.  PCP had increase hydralazine to 25 mg twice daily for high blood pressure.  Plan was to increase to 50 mg twice daily pressures are persistently above 140 mmHg.  Plan was also to consider referral to lipid clinic.  (She had been on Repatha,  but that had some stopped due to insurance issues).  She was tolerating rosuvastatin pretty well.  Subjective  Discussed the use of AI scribe software for clinical note transcription with the patient, who gave verbal consent to proceed.  History of Present Illness   SHTERNA LARAMEE is a 53 year old female with coronary artery disease who presents for follow-up of her cardiac condition.  She has a history of coronary artery disease with multiple interventions, including myocardial infarctions and stent placements in the right coronary artery and left anterior descending artery. Her first myocardial infarction occurred in 2017, with stent placements in 2014, 2017, and 2019. In 2014, her right coronary artery was completely occluded and opened with stents. In March 2019, a long area of disease in her anterior descending artery was stented, but a side branch was lost. She underwent another procedure a few months later to reopen a stent that had closed. Her last cardiac catheterization in 2021 showed open stents except for one closed side branch. An echocardiogram showed a left ventricular ejection fraction of 50-55% with hypokinesis at the apex.  She experiences dizziness twice daily, particularly in the morning when getting up and sometimes at work. She describes feeling lightheaded but does not experience syncope and manages this by sitting down when needed. No palpitations, chest pain, or nocturnal dyspnea. She sleeps on one pillow and does not wake up due to breathing difficulties.  Her current medications include carvedilol 25 mg twice daily, Entresto 97/103 mg twice daily, spironolactone 12.5 mg, rosuvastatin 40 mg, and diabetes medications Trulicity and Basaglar. She has not been taking Repatha for some time and uses hydralazine as needed for hypertension.  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  She reports leg pain and dyspnea when climbing stairs, specifically by the third floor, but not during regular walking activities. No peripheral edema.       Objective   Current Meds  CV Meds Sig   carvedilol (COREG) 25 MG tablet Take 1 tablet (25 mg total) by mouth 2 (two) times daily.   clopidogrel (PLAVIX) 75 MG tablet Take 1 tablet (75 mg total) by mouth daily.   nitroGLYCERIN (NITROSTAT) 0.4 MG SL tablet Place 1 tablet (0.4 mg total) under the tongue every 5 (five) minutes as needed for chest pain. Reported on 11/25/2015   rosuvastatin (CRESTOR) 40 MG tablet Take 1 tablet (40 mg total) by mouth daily.   sacubitril-valsartan (ENTRESTO) 97-103 MG Take 1 tablet by mouth 2 (two) times  daily.   [DISCONTINUED] hydrALAZINE (APRESOLINE) 25 MG tablet Take 1 tablet (25 mg total) by mouth in the morning and at bedtime.   [Dose increased] spironolactone (ALDACTONE) 25 MG tablet Take 0.5 tablets (12.5 mg total) by mouth daily. => INCREASED TO 25 mg Daily  DM-2 Meds  Continuous Glucose Sensor (DEXCOM G7 SENSOR) MISC Use to check blood sugar throughout the day. Change sensors once every 10 days. E11.65   Dulaglutide (TRULICITY) 1.5 MG/0.5ML SOAJ Inject 1.5 mg into the skin once a week.   insulin glargine (LANTUS SOLOSTAR) 100 UNIT/ML Solostar Pen Inject 20 Units into the skin daily.  GI Meds  famotidine (PEPCID) 20 MG tablet Take 1 tablet (20 mg total) by mouth at bedtime.  Other  Multiple Vitamins-Minerals (WOMENS 50+ MULTI VITAMIN PO) Take 1 tablet by mouth daily.    Studies Reviewed: Marland Kitchen        ECHO 02/2021: EF mildly reduced at 50 to 55% with apical akinesis.  GR 2 DD with elevated LAP and moderate LA dilation.  Mild MR.  Tricuspid aortic valve otherwise normal.  Normal RV size and function.  Normal PAP and RAP.  CATH 10/2019: Stable 3 V CAD- widely patent stents in LAD & RCA with known CTO of Diag branch & moderate OM disease. No culprit lesion to explain reduced LVEF Relatively normal RHC #s  wth only mildly elevated LVEDP and PCWP despite systemic hypertension with blood pressures in the 170s systolic.     Lab Results  Component Value Date   CHOL 113 04/13/2023   HDL 44 04/13/2023   LDLCALC 51 04/13/2023   TRIG 97 04/13/2023   CHOLHDL 2.6 04/13/2023    Risk Assessment/Calculations:             Physical Exam:   VS:  BP 110/78 (BP Location: Right Arm, Patient Position: Sitting, Cuff Size: Normal)   Pulse 85   Ht 5\' 9"  (1.753 m)   Wt 218 lb 9.6 oz (99.2 kg)   LMP 10/20/2015   SpO2 98%   BMI 32.28 kg/m    Wt Readings from Last 3 Encounters:  10/29/23 218 lb 9.6 oz (99.2 kg)  06/15/23 217 lb (98.4 kg)  06/05/23 218 lb 12.8 oz (99.2 kg)    GEN: Mildly obese, but otherwise appearing.  Well nourished, well groomed in no acute distress; does not have a cigarette smoke. NECK: No JVD; No carotid bruits CARDIAC: RRR with rare ectopy; distant but normal S1& S2; no murmurs, rubs, gallops RESPIRATORY:  Clear to auscultation without rales, wheezing or rhonchi ; nonlabored, good air movement. ABDOMEN: Soft, non-tender, non-distended EXTREMITIES:  No edema; No deformity      ASSESSMENT AND PLAN: .    Problem List Items Addressed This Visit       Cardiology Problems   Coronary artery disease involving native coronary artery of native heart without angina pectoris (Chronic)   Multiple myocardial infarctions with stent placements in the RCA and LAD dating back to 2014 is reviewed.  Last cath in 2021 showed patent stents with known occlusion of the jailed diagonal branch lost during the anterior STEMI. Echo showed improved EF up to 50-55% with apical akinesis. Currently asymptomatic with no chest pain, dyspnea, or palpitations.  Engages in regular physical activity without significant limitations.  - Continue carvedilol 25 mg twice daily - Continue Entresto 97/103 mg twice daily - Continue Plavix 75 mg daily as prescribed  Okay to hold Plavix 5 to 7 days preop for  surgeries or  procedures. -Continue rosuvastatin 40 mg daily => reassess lipid panel and consider rereferral for reinitiation of Repatha if not at goal. - Educate on lifestyle modifications including diet and exercise      Relevant Medications   spironolactone (ALDACTONE) 25 MG tablet   Other Relevant Orders   VAS [place] ABI WITH/WO TBI   VAS [place] LOWER EXTREMITY ARTERIAL DUPLEX   Hyperlipidemia associated with type 2 diabetes mellitus (HCC) (Chronic)   Hyperlipidemia managed with rosuvastatin 40 mg daily.  Repatha discontinued, and cholesterol levels not checked since September 2024. Emphasized maintaining optimal cholesterol levels to prevent further arterial blockages. - Continue rosuvastatin 40 mg daily - Check fasting cholesterol levels during upcoming primary care visit - Consider resuming Repatha if cholesterol levels are elevated  Type 2 diabetes managed with Trulicity and Basaglar. Coordination with primary care to ensure comprehensive diabetes management. - Ensure primary care physician checks A1c and glucose levels during upcoming visit      Relevant Medications   spironolactone (ALDACTONE) 25 MG tablet   Hypertensive heart disease with chronic diastolic congestive heart failure (HCC) - Primary (Chronic)   Overall pretty stable now blood pressures have been relatively well-controlled.   She has had ischemic cardiomyopathy episodes that have been resolved along with Takotsubo and hypertensive emergency all leading to reduced EF.  Thankfully, her most recent EF is back to 5055%. Seems relatively euvolemic on current meds.  NYHA class I-IIa symptoms.  Not on diuretic. Infectious no recent dizziness. - Continue carvedilol 25 mg twice daily along with Entresto 97/103 mg twice daily, and increased dose of spironolactone to 25 mg daily  - discontinue hydralazine 25 mg twice daily-use for as needed SBP greater than 150 mg mercury . -On Trulicity (GLP-1 agonist), but not SGLT2  inhibitor.         Relevant Medications   spironolactone (ALDACTONE) 25 MG tablet   ST elevation myocardial infarction (STEMI) involving left anterior descending (LAD) coronary artery with complication (HCC) (Chronic)   Back-to-back anterior STEMI's in 2019 during which the diagonal branch was lost and then the apical LAD stent was occluded for the second stent.  As such he took a pretty significant anterior wall hit with apical akinesis.  With thankfully EF suspected 50 and 55%. She has multiple stents in the LAD and RCA but is otherwise doing well.      Relevant Medications   spironolactone (ALDACTONE) 25 MG tablet     Other   Dizziness   Dizziness occurring twice daily, primarily in the morning and sometimes at work. Not associated with orthostatic changes. Suspected to be related to hydralazine use. Decision made to discontinue hydralazine to alleviate symptoms. - Discontinue hydralazine - Increase spironolactone to 25 mg daily - Instruct to monitor blood pressure and use hydralazine only if systolic BP >150 mmHg      Ischemic leg pain   Leg pain and fatigue when climbing stairs, suggestive of peripheral artery disease. Differential includes claudication due to arterial insufficiency. Plan to assess for peripheral artery disease to prevent progression and manage symptoms effectively. - Order ankle-brachial index (ABI) to assess for peripheral artery disease - Consider lower extremity arterial ultrasound if ABI is abnormal      Relevant Orders   VAS [place] ABI WITH/WO TBI   VAS [place] LOWER EXTREMITY ARTERIAL DUPLEX   S/P primary angioplasty with coronary stent (Chronic)   Significant stents in both the RCA and the LAD as a result she is on lifelong Thienopyridine SAP T (Plavix 75  mg daily)  She is far enough out from PCI of the would be okay to temporarily hold Plavix. Okay to hold Plavix 75 mg 5 to 7 days preop for surgical procedures.      Tobacco abuse (Chronic)   Tobacco abuse  counseling (Chronic)   Smoking cessation instruction/counseling given:  counseled patient on the dangers of tobacco use, advised patient to stop smoking, and reviewed strategies to maximize success       Other Visit Diagnoses       Primary hypertension       Relevant Medications   spironolactone (ALDACTONE) 25 MG tablet       Follow-up:  Return in about 6 months (around 04/29/2024) for Alternate 6 month follow-up with APP & MD, [facility] [address] Office. - Schedule follow-up with Cardiology APP - Azalee Course, PA  in 6 months - Schedule follow-up with Cardiologist in 1 year Coordination with primary care and cardiology for comprehensive care. - Follow up with primary care physician on October 31, 2023, for blood work  I spent 51 minutes in the care of SHRITA THIEN today including reviewing labs (1 min), reviewing studies (9 min reviewing most recent cath and echo and comparing old studies.), face to face time discussing treatment options (19 min), reviewing records from several clinic visits with APP as well as hospitalizations (9 minutes), 13 minutes dictating/summarizing, and documenting in the encounter. I have not seen the patient for almost 3 years.    Signed, Marykay Lex, MD, MS Bryan Lemma, M.D., M.S. Interventional Cardiologist  [facility]  Pager # [phone] Phone # [phone] [address] [place], [place] [address]

## 2023-10-30 ENCOUNTER — Encounter: Payer: Self-pay | Admitting: Cardiology

## 2023-10-30 ENCOUNTER — Other Ambulatory Visit: Payer: Self-pay

## 2023-10-30 DIAGNOSIS — R42 Dizziness and giddiness: Secondary | ICD-10-CM | POA: Insufficient documentation

## 2023-10-30 NOTE — Assessment & Plan Note (Signed)
 Back-to-back anterior STEMI's in 2019 during which the diagonal branch was lost and then the apical LAD stent was occluded for the second stent.  As such he took a pretty significant anterior wall hit with apical akinesis.  With thankfully EF suspected 50 and 55%. She has multiple stents in the LAD and RCA but is otherwise doing well.

## 2023-10-30 NOTE — Assessment & Plan Note (Signed)
 Hyperlipidemia managed with rosuvastatin 40 mg daily.  Repatha discontinued, and cholesterol levels not checked since September 2024. Emphasized maintaining optimal cholesterol levels to prevent further arterial blockages. - Continue rosuvastatin 40 mg daily - Check fasting cholesterol levels during upcoming primary care visit - Consider resuming Repatha if cholesterol levels are elevated  Type 2 diabetes managed with Trulicity and Basaglar. Coordination with primary care to ensure comprehensive diabetes management. - Ensure primary care physician checks A1c and glucose levels during upcoming visit

## 2023-10-30 NOTE — Assessment & Plan Note (Signed)
 Dizziness occurring twice daily, primarily in the morning and sometimes at work. Not associated with orthostatic changes. Suspected to be related to hydralazine use. Decision made to discontinue hydralazine to alleviate symptoms. - Discontinue hydralazine - Increase spironolactone to 25 mg daily - Instruct to monitor blood pressure and use hydralazine only if systolic BP >150 mmHg

## 2023-10-30 NOTE — Assessment & Plan Note (Signed)
 Leg pain and fatigue when climbing stairs, suggestive of peripheral artery disease. Differential includes claudication due to arterial insufficiency. Plan to assess for peripheral artery disease to prevent progression and manage symptoms effectively. - Order ankle-brachial index (ABI) to assess for peripheral artery disease - Consider lower extremity arterial ultrasound if ABI is abnormal

## 2023-10-30 NOTE — Assessment & Plan Note (Addendum)
 Overall pretty stable now blood pressures have been relatively well-controlled.   She has had ischemic cardiomyopathy episodes that have been resolved along with Takotsubo and hypertensive emergency all leading to reduced EF.  Thankfully, her most recent EF is back to 5055%. Seems relatively euvolemic on current meds.  NYHA class I-IIa symptoms.  Not on diuretic. Infectious no recent dizziness. - Continue carvedilol 25 mg twice daily along with Entresto 97/103 mg twice daily, and increased dose of spironolactone to 25 mg daily  - discontinue hydralazine 25 mg twice daily-use for as needed SBP greater than 150 mg mercury . -On Trulicity (GLP-1 agonist), but not SGLT2 inhibitor.

## 2023-10-30 NOTE — Assessment & Plan Note (Signed)
 Significant stents in both the RCA and the LAD as a result she is on lifelong Thienopyridine SAP T (Plavix 75 mg daily)  She is far enough out from PCI of the would be okay to temporarily hold Plavix. Okay to hold Plavix 75 mg 5 to 7 days preop for surgical procedures.

## 2023-10-30 NOTE — Assessment & Plan Note (Signed)
 Smoking cessation instruction/counseling given:  counseled patient on the dangers of tobacco use, advised patient to stop smoking, and reviewed strategies to maximize success

## 2023-10-30 NOTE — Assessment & Plan Note (Signed)
 Multiple myocardial infarctions with stent placements in the RCA and LAD dating back to 2014 is reviewed.  Last cath in 2021 showed patent stents with known occlusion of the jailed diagonal branch lost during the anterior STEMI. Echo showed improved EF up to 50-55% with apical akinesis. Currently asymptomatic with no chest pain, dyspnea, or palpitations.  Engages in regular physical activity without significant limitations.  - Continue carvedilol 25 mg twice daily - Continue Entresto 97/103 mg twice daily - Continue Plavix 75 mg daily as prescribed  Okay to hold Plavix 5 to 7 days preop for surgeries or procedures. -Continue rosuvastatin 40 mg daily => reassess lipid panel and consider rereferral for reinitiation of Repatha if not at goal. - Educate on lifestyle modifications including diet and exercise

## 2023-10-31 ENCOUNTER — Other Ambulatory Visit: Payer: Self-pay

## 2023-10-31 ENCOUNTER — Encounter: Payer: Self-pay | Admitting: Nurse Practitioner

## 2023-10-31 ENCOUNTER — Ambulatory Visit: Attending: Nurse Practitioner | Admitting: Nurse Practitioner

## 2023-10-31 VITALS — BP 110/71 | HR 70 | Resp 20 | Ht 69.0 in | Wt 221.4 lb

## 2023-10-31 DIAGNOSIS — G8929 Other chronic pain: Secondary | ICD-10-CM

## 2023-10-31 DIAGNOSIS — M545 Low back pain, unspecified: Secondary | ICD-10-CM

## 2023-10-31 DIAGNOSIS — E1169 Type 2 diabetes mellitus with other specified complication: Secondary | ICD-10-CM

## 2023-10-31 DIAGNOSIS — Z794 Long term (current) use of insulin: Secondary | ICD-10-CM | POA: Diagnosis not present

## 2023-10-31 MED ORDER — CYCLOBENZAPRINE HCL 10 MG PO TABS
10.0000 mg | ORAL_TABLET | Freq: Three times a day (TID) | ORAL | 0 refills | Status: DC | PRN
Start: 2023-10-31 — End: 2024-04-18
  Filled 2023-10-31: qty 30, 10d supply, fill #0

## 2023-10-31 NOTE — Progress Notes (Signed)
 I have seen and examined this patient with the advanced practice provider STUDENT and agree with the note below

## 2023-10-31 NOTE — Progress Notes (Signed)
 Assessment & Plan:  Amy Chaney was seen today for medical management of chronic issues.  Diagnoses and all orders for this visit:  Type 2 diabetes mellitus with other specified complication, with long-term current use of insulin (HCC) -     Hemoglobin A1c Recommended DASH/Mediterranean diet, eliminating sugar foods and drinks. Exercise such as walking 3-5 times per week for at least 30 minutes.   Chronic left-sided low back pain without sciatica -     cyclobenzaprine (FLEXERIL) 10 MG tablet; Take 1 tablet (10 mg total) by mouth 3 (three) times daily as needed for muscle spasms. -     Urinalysis, Complete Recommended physical therapy-patient declined due to work schedule. Avoid NSAIDs due to risk of cardiovascular event    Patient has been counseled on age-appropriate routine health concerns for screening and prevention. These are reviewed and up-to-date. Referrals have been placed accordingly. Immunizations are up-to-date or declined.    Subjective:   Chief Complaint  Patient presents with   Medical Management of Chronic Issues    Amy Chaney 53 y.o. female presents to office today presented today for low back pain and Hemoglobin A1c. A1C on 06/04/2024, was 7.8.   She is a patient of Dr. Alvis Lemmings.  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 Back Pain 5/10 on pain scale and worse after she leaves work, 10/10. She is taking Aleve for pain with minimal relief. Instructed patient to take Tylenol ES OTC as directed on package. Recommended referral to Physical Therapy, patient declined due to her schedule.    Lab Results  Component Value Date   HGBA1C 7.8 (A) 06/05/2023     Review of Systems  Constitutional: Negative.   HENT: Negative.    Eyes: Negative.   Respiratory: Negative.    Cardiovascular: Negative.   Gastrointestinal: Negative.   Genitourinary: Negative.   Musculoskeletal:  Positive for back pain.   Skin: Negative.   Neurological: Negative.   Endo/Heme/Allergies: Negative.    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 s/p PCI of the mid RCA 01/2013 //  b. LHC 9/14: EF 55%, RCA stent 100% occluded, LAD irregularities >>  subacute stent thrombosis, Promus DES PCI distal overlap // c) 09/2017 - Ant STEMI - LAD-D2 PCI - Successful PCI of LAD (3 overlapping DES), unable to restore flow down D2l that was stented as well.  Likely related to downstream dissection, but unable to rewire.   Daily headache    Depression    Dyslipidemia, goal LDL below 70    History of Doppler ultrasound    carotid bruit >> a. 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  EF down to 20-25% global hypokinesis.  Was in the setting of grieving over the loss of her son from MI.  ->  EF improved to 45-50% on follow-up echocardiogram.   Hypertension    Ischemic cardiomyopathy 06/2018   s/p inferior STEMI followed by 2 anterior STEMIs: Following recovery from Takotsubo Cardiomyopathy- Echo 02/14/19: Moderate LVH.  EF improved up to 45-50%.  GR 1 DD.  Severe apical akinesis.  Normal valves.   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 b) distal mLAD stent Edge 100% thrombosis --> PTCA & Overlapping DES PCI (Synergy 3 x 12 --> 3.6-3.3 mm). D2 occluded. RCA stents patent, 65 % OM2. EF 30-35%.   STEMI involving oth coronary artery of inferior wall  (HCC) 03/2013   Secondary to subacute stent thrombosis of RCA stent segment having missed 4 doses of Effient.   Tobacco abuse 01/11/2016   Type II diabetes mellitus (HCC) 12/2010   sister and son also diabetic     Past Surgical History:  Procedure Laterality Date   CHOLECYSTECTOMY  ~ 2000   CORONARY STENT INTERVENTION N/A 10/10/2017   Procedure: CORONARY STENT INTERVENTION;  Surgeon: Marykay Lex, MD;  Location: [facility] INVASIVE CV LAB;  Service: Cardiovascular; LAD PCI: STENT SYNERGY DES 3.5X32 (p),STENT SYNERGY DES 3.5X 8 (m), STENT SYNERGY DES 3.5X28 (d).  D2 PCI: STENT SYNERGY DES 2.5X24.  (UNABL TO REWIRE & EXPAND - TO OF DIAG AT END OF CASE)   CORONARY STENT INTERVENTION N/A 07/03/2018   Procedure: CORONARY STENT INTERVENTION;  Surgeon: Marykay Lex, MD;  Location: [facility] INVASIVE CV LAB;; PTCA & overlapping DES PCI (overlaps prior stents distally) - Synergy DES 3 x 12 (3.6 mm @ overlap -- 3.3 mm distally)>   LEFT HEART CATH AND CORONARY ANGIOGRAPHY N/A 10/10/2017   Procedure: LEFT HEART CATH AND CORONARY ANGIOGRAPHY;  Surgeon: Marykay Lex, MD;  Location: [facility] INVASIVE CV LAB;  Service: Cardiovascular;  Laterality: N/A; -patent RCA stents with mild in-stent restenosis. Med[place] 1,1,1 mLD-D2 90% (complicated by dissection/intramural thrombus)   LEFT HEART CATH AND CORONARY ANGIOGRAPHY N/A 07/03/2018   Procedure: LEFT HEART CATH AND CORONARY ANGIOGRAPHY;  Surgeon: Marykay Lex, MD;  Location: [facility] INVASIVE CV LAB;; 100% thrombotic occlusion distal LAD stent -- DES PCI. continued 100% D2 (previously lost). patent RCA stents, 65% OM2.  EF 30-35%.   LEFT HEART CATHETERIZATION WITH CORONARY ANGIOGRAM N/A 02/25/2013   Procedure: LEFT HEART CATHETERIZATION WITH CORONARY ANGIOGRAM;  Surgeon: Pamella Pert, MD;  Location: [facility] CATH LAB;  Service: Cardiovascular;; CTO m RCA; otherwise normal coronaries   LEFT HEART CATHETERIZATION WITH CORONARY ANGIOGRAM N/A 04/01/2013   Procedure: LEFT HEART  CATHETERIZATION WITH CORONARY ANGIOGRAM;  Surgeon: Pamella Pert, MD;  Location[facility] CATH LAB;  Service: Cardiovascular: 100% occlusion of distal RCA stent (subacute stent thrombosis -  secondary to stopping Effient).  Overlapping DES PCI   NM MYOVIEW LTD  12/2015    EF 38%.  Hypertensive response to exercise (231/132 mmHg).  INTERMEDIATE RISK due to -diffuse hypokinesis and reduced EF.  No ischemia or infarction.   PERCUTANEOUS CORONARY STENT INTERVENTION (PCI-S)  04/01/2013   Procedure: PERCUTANEOUS CORONARY STENT INTERVENTION (PCI-S);  Surgeon: Pamella Pert, MD;  Location: [facility] CATH LAB;  Service: Cardiovascular;; PCI of distal RCA stent subacute thrombosis: Promus Premier DES 3.0 mm x 20 mm.   PERCUTANEOUS CORONARY STENT INTERVENTION (PCI-S)  02/25/2013   Procedure: PERCUTANEOUS CORONARY STENT INTERVENTION (PCI-S);  Surgeon: Pamella Pert, MD;  Location: [facility] CATH LAB;  RCA CTO PCI with overlapping Promus DES: 3.0 mm x 38 mm, 3.5 mm x 16mm   RIGHT/LEFT HEART CATH AND CORONARY ANGIOGRAPHY N/A 11/04/2019   Procedure: RIGHT/LEFT  HEART CATH AND CORONARY ANGIOGRAPHY;  Surgeon: Marykay Lex, MD;  Location: [facility] INVASIVE CV LAB;; Mildly elevated LVEDP.  Ostial D1 100%.  Proximal to mid LAD stent 10% ISR.  Mid and distal stent widely patent.  30% distal distance.  Ostial OM 2 55%.  Proximal to mid RCA stent 50% stenosis.  Mid to distal RCA 40%.  Suspect that reduced EF is related to hypertensive emergen   TRANSTHORACIC ECHOCARDIOGRAM  07/04/2018   recurrent Anterior STEMI: EF 35-40%.  Apical hypokinesis.  GRII DD.  No thrombus noted.  (Improved EF from 30% up to 35-40% from previous echo)   TRANSTHORACIC ECHOCARDIOGRAM  11/2018; 01/2019   a) TAKOTUSBO CM: EF 20-25%-diffuse HK..  Mod V thickness.  GRII DD w/ high LVEDP.  Severe LA dilation;; b) 02/14/19: Moderate LVH.  EF improved up to 45-50%.  GR 1 DD.  Severe apical akinesis.  Normal valves.   TRANSTHORACIC ECHOCARDIOGRAM  10/31/2019   Hypertensive  emergency, hypoxic/hypercapnic respiratory failure;  EF 20 to 25%.  Global HK.  Mildly dilated LV.  Mildly elevated PA pressures.  Mild LA dilation.  Mild AR.  Mildly elevated RAP-8 mmHg.=> Eventual cath showed no change   TUBAL LIGATION  1994    Family History  Problem Relation Age of Onset   Diabetes Mother    Diabetes Sister    Diabetes Sister    Diabetes Son        type 1   Liver disease Neg Hx    Colon cancer Neg Hx    Esophageal cancer Neg Hx     Social History Reviewed with no changes to be made today.   Outpatient Medications Prior to Visit  Medication Sig Dispense Refill   carvedilol (COREG) 25 MG tablet Take 1 tablet (25 mg total) by mouth 2 (two) times daily. 180 tablet 1   clopidogrel (PLAVIX) 75 MG tablet Take 1 tablet (75 mg total) by mouth daily. 90 tablet 1   Continuous Glucose Sensor (DEXCOM G7 SENSOR) MISC Use to check blood sugar throughout the day. Change sensors once every 10 days. E11.65 3 each 6   Dulaglutide (TRULICITY) 1.5 MG/0.5ML SOAJ Inject 1.5 mg into the skin once a week. 6 mL 1   famotidine (PEPCID) 20 MG tablet Take 1 tablet (20 mg total) by mouth at bedtime. 90 tablet 1   insulin glargine (LANTUS SOLOSTAR) 100 UNIT/ML Solostar Pen Inject 20 Units into the skin daily. 30 mL 6   Insulin Pen Needle (TECHLITE PEN NEEDLES) 32G X 4 MM MISC Use 1 pen needle with insulin once daily 100 each 2   Multiple Vitamins-Minerals (WOMENS 50+ MULTI VITAMIN PO) Take 1 tablet by mouth daily.     rosuvastatin (CRESTOR) 40 MG tablet Take 1 tablet (40 mg total) by mouth daily. 90 tablet 1   sacubitril-valsartan (ENTRESTO) 97-103 MG Take 1 tablet by mouth 2 (two) times daily. 180 tablet 2   spironolactone (ALDACTONE) 25 MG tablet Take 1 tablet (25 mg total) by mouth daily. 90 tablet 3   TRUEPLUS LANCETS 28G MISC 28 g by Does not apply route 4 (four) times daily. 120 each 2   nitroGLYCERIN (NITROSTAT) 0.4 MG SL tablet Place 1 tablet (0.4 mg total) under the tongue every 5  (five) minutes as needed for chest pain. Reported on 11/25/2015 (Patient not taking: Reported on 10/31/2023) 25 tablet 3   No facility-administered medications prior to visit.    Allergies  Allergen Reactions   Farxiga [Dapagliflozin] Other (See Comments)  Caused UTI and polyps on kidneys        Objective:    BP 110/71 (BP Location: Right Arm, Patient Position: Sitting, Cuff Size: Normal)   Pulse 70   Resp 20   Ht 5\' 9"  (1.753 m)   Wt 221 lb 6.4 oz (100.4 kg)   LMP 10/20/2015   SpO2 100%   BMI 32.70 kg/m  Wt Readings from Last 3 Encounters:  10/31/23 221 lb 6.4 oz (100.4 kg)  10/29/23 218 lb 9.6 oz (99.2 kg)  06/15/23 217 lb (98.4 kg)   BP Readings from Last 3 Encounters:  10/31/23 110/71  10/29/23 110/78  09/20/23 117/74     Physical Exam Vitals and nursing note reviewed.  Constitutional:      Appearance: Normal appearance.  Cardiovascular:     Rate and Rhythm: Normal rate and regular rhythm.     Heart sounds: Normal heart sounds.  Pulmonary:     Effort: Pulmonary effort is normal.     Breath sounds: Normal breath sounds.  Musculoskeletal:        General: Tenderness present.     Cervical back: Normal and normal range of motion.     Thoracic back: Normal.     Lumbar back: Spasms and tenderness present.       Back:  Skin:    General: Skin is warm and dry.  Neurological:     Mental Status: She is alert and oriented to person, place, and time.  Psychiatric:        Attention and Perception: Attention normal.        Mood and Affect: Mood normal.        Speech: Speech normal.        Behavior: Behavior normal. Behavior is cooperative.        Thought Content: Thought content normal.        Cognition and Memory: Cognition normal.        Judgment: Judgment normal.        Patient has been counseled extensively about nutrition and exercise as well as the importance of adherence with medications and regular follow-up. The patient was given clear instructions to go  to ER or return to medical center if symptoms don't improve, worsen or new problems develop. The patient verbalized understanding.   Follow-up: Return in about 3 months (around 01/30/2024) for A1c with PCP.   Joette Catching, BSN, RN student FNP [facility] [place], [place] [phone]   10/31/2023, 4:55 PM

## 2023-11-01 ENCOUNTER — Other Ambulatory Visit: Payer: Self-pay

## 2023-11-01 LAB — URINALYSIS, COMPLETE
Bilirubin, UA: NEGATIVE
Glucose, UA: NEGATIVE
Ketones, UA: NEGATIVE
Leukocytes,UA: NEGATIVE
Nitrite, UA: POSITIVE — AB
RBC, UA: NEGATIVE
Specific Gravity, UA: 1.013 (ref 1.005–1.030)
Urobilinogen, Ur: 1 mg/dL (ref 0.2–1.0)
pH, UA: 6 (ref 5.0–7.5)

## 2023-11-01 LAB — MICROSCOPIC EXAMINATION
Casts: NONE SEEN /LPF
RBC, Urine: NONE SEEN /HPF (ref 0–2)

## 2023-11-01 LAB — HEMOGLOBIN A1C
Est. average glucose Bld gHb Est-mCnc: 160 mg/dL
Hgb A1c MFr Bld: 7.2 % — ABNORMAL HIGH (ref 4.8–5.6)

## 2023-11-05 ENCOUNTER — Other Ambulatory Visit: Payer: Self-pay | Admitting: Cardiology

## 2023-11-05 ENCOUNTER — Encounter: Payer: Self-pay | Admitting: Nurse Practitioner

## 2023-11-05 ENCOUNTER — Other Ambulatory Visit: Payer: Self-pay | Admitting: Nurse Practitioner

## 2023-11-05 DIAGNOSIS — R42 Dizziness and giddiness: Secondary | ICD-10-CM

## 2023-11-05 DIAGNOSIS — Z720 Tobacco use: Secondary | ICD-10-CM

## 2023-11-05 DIAGNOSIS — Z716 Tobacco abuse counseling: Secondary | ICD-10-CM

## 2023-11-05 DIAGNOSIS — E1169 Type 2 diabetes mellitus with other specified complication: Secondary | ICD-10-CM

## 2023-11-05 DIAGNOSIS — Z955 Presence of coronary angioplasty implant and graft: Secondary | ICD-10-CM

## 2023-11-05 DIAGNOSIS — I2510 Atherosclerotic heart disease of native coronary artery without angina pectoris: Secondary | ICD-10-CM

## 2023-11-05 DIAGNOSIS — I10 Essential (primary) hypertension: Secondary | ICD-10-CM

## 2023-11-05 DIAGNOSIS — I110 Hypertensive heart disease with heart failure: Secondary | ICD-10-CM

## 2023-11-05 DIAGNOSIS — I2102 ST elevation (STEMI) myocardial infarction involving left anterior descending coronary artery: Secondary | ICD-10-CM

## 2023-11-05 DIAGNOSIS — I998 Other disorder of circulatory system: Secondary | ICD-10-CM

## 2023-11-05 DIAGNOSIS — N390 Urinary tract infection, site not specified: Secondary | ICD-10-CM

## 2023-11-05 MED ORDER — NITROFURANTOIN MONOHYD MACRO 100 MG PO CAPS
100.0000 mg | ORAL_CAPSULE | Freq: Two times a day (BID) | ORAL | 0 refills | Status: AC
  Filled 2023-11-05: qty 10, 5d supply, fill #0

## 2023-11-06 ENCOUNTER — Other Ambulatory Visit: Payer: Self-pay

## 2023-11-09 ENCOUNTER — Other Ambulatory Visit: Payer: Self-pay

## 2023-11-16 ENCOUNTER — Ambulatory Visit (HOSPITAL_COMMUNITY)
Admission: RE | Admit: 2023-11-16 | Discharge: 2023-11-16 | Disposition: A | Discharge status: Home / Self Care | Source: Ambulatory Visit | Attending: Cardiovascular Disease | Admitting: Cardiovascular Disease

## 2023-11-16 ENCOUNTER — Encounter (HOSPITAL_COMMUNITY): Payer: Self-pay

## 2023-11-16 ENCOUNTER — Ambulatory Visit (HOSPITAL_COMMUNITY): Admission: RE | Admit: 2023-11-16 | Source: Ambulatory Visit | Attending: Cardiology | Admitting: Cardiology

## 2023-11-16 DIAGNOSIS — I998 Other disorder of circulatory system: Secondary | ICD-10-CM | POA: Diagnosis present

## 2023-11-16 DIAGNOSIS — M79662 Pain in left lower leg: Secondary | ICD-10-CM | POA: Diagnosis not present

## 2023-11-16 DIAGNOSIS — M79606 Pain in leg, unspecified: Secondary | ICD-10-CM | POA: Diagnosis present

## 2023-11-16 DIAGNOSIS — M79661 Pain in right lower leg: Secondary | ICD-10-CM | POA: Diagnosis not present

## 2023-11-16 LAB — VAS US ABI WITH/WO TBI: Right ABI: 1.12

## 2023-11-27 ENCOUNTER — Other Ambulatory Visit: Payer: Self-pay

## 2023-11-27 ENCOUNTER — Other Ambulatory Visit: Payer: Self-pay | Admitting: Family Medicine

## 2023-11-27 DIAGNOSIS — I25110 Atherosclerotic heart disease of native coronary artery with unstable angina pectoris: Secondary | ICD-10-CM

## 2023-11-27 MED ORDER — ROSUVASTATIN CALCIUM 40 MG PO TABS
40.0000 mg | ORAL_TABLET | Freq: Every day | ORAL | 2 refills | Status: DC
  Filled 2023-11-27: qty 90, 90d supply, fill #0
  Filled 2024-02-29: qty 90, 90d supply, fill #1
  Filled 2024-06-06: qty 90, 90d supply, fill #2

## 2023-11-28 ENCOUNTER — Other Ambulatory Visit: Payer: Self-pay

## 2023-11-28 ENCOUNTER — Encounter: Payer: Self-pay | Admitting: Cardiology

## 2023-12-27 ENCOUNTER — Other Ambulatory Visit: Payer: Self-pay

## 2023-12-27 ENCOUNTER — Other Ambulatory Visit: Payer: Self-pay | Admitting: Family Medicine

## 2023-12-27 ENCOUNTER — Other Ambulatory Visit (HOSPITAL_COMMUNITY): Payer: Self-pay

## 2023-12-27 DIAGNOSIS — I25110 Atherosclerotic heart disease of native coronary artery with unstable angina pectoris: Secondary | ICD-10-CM

## 2023-12-27 MED ORDER — CLOPIDOGREL BISULFATE 75 MG PO TABS
75.0000 mg | ORAL_TABLET | Freq: Every day | ORAL | 1 refills | Status: DC
Start: 2023-12-27 — End: 2024-05-01
  Filled 2023-12-27: qty 90, 90d supply, fill #0
  Filled 2024-03-07 – 2024-03-28 (×2): qty 90, 90d supply, fill #1

## 2023-12-28 ENCOUNTER — Other Ambulatory Visit: Payer: Self-pay

## 2024-01-04 ENCOUNTER — Other Ambulatory Visit: Payer: Self-pay

## 2024-01-18 ENCOUNTER — Telehealth: Payer: Self-pay | Admitting: Family Medicine

## 2024-01-18 NOTE — Telephone Encounter (Signed)
 Copied from CRM [id number]. Topic: General - Call Back - No Documentation >> Jan 18, 2024  3:06 PM Sasha H wrote:  Reason for CRM: Pt is requesting to speak with a Child psychotherapist

## 2024-01-25 ENCOUNTER — Other Ambulatory Visit: Payer: Self-pay

## 2024-01-30 ENCOUNTER — Ambulatory Visit: Attending: Family Medicine | Admitting: Family Medicine

## 2024-01-30 ENCOUNTER — Other Ambulatory Visit: Payer: Self-pay

## 2024-01-30 ENCOUNTER — Telehealth: Payer: Self-pay

## 2024-01-30 ENCOUNTER — Encounter: Payer: Self-pay | Admitting: Family Medicine

## 2024-01-30 VITALS — BP 128/81 | HR 82 | Ht 69.0 in | Wt 221.2 lb

## 2024-01-30 DIAGNOSIS — E1169 Type 2 diabetes mellitus with other specified complication: Secondary | ICD-10-CM | POA: Diagnosis not present

## 2024-01-30 DIAGNOSIS — J329 Chronic sinusitis, unspecified: Secondary | ICD-10-CM

## 2024-01-30 DIAGNOSIS — Z7985 Long-term (current) use of injectable non-insulin antidiabetic drugs: Secondary | ICD-10-CM

## 2024-01-30 DIAGNOSIS — Z794 Long term (current) use of insulin: Secondary | ICD-10-CM

## 2024-01-30 DIAGNOSIS — I110 Hypertensive heart disease with heart failure: Secondary | ICD-10-CM

## 2024-01-30 DIAGNOSIS — I779 Disorder of arteries and arterioles, unspecified: Secondary | ICD-10-CM

## 2024-01-30 DIAGNOSIS — I5042 Chronic combined systolic (congestive) and diastolic (congestive) heart failure: Secondary | ICD-10-CM

## 2024-01-30 DIAGNOSIS — I25110 Atherosclerotic heart disease of native coronary artery with unstable angina pectoris: Secondary | ICD-10-CM

## 2024-01-30 DIAGNOSIS — I10 Essential (primary) hypertension: Secondary | ICD-10-CM | POA: Diagnosis not present

## 2024-01-30 LAB — POCT GLYCOSYLATED HEMOGLOBIN (HGB A1C): HbA1c, POC (controlled diabetic range): 9.1 % — AB (ref 0.0–7.0)

## 2024-01-30 MED ORDER — LANTUS SOLOSTAR 100 UNIT/ML ~~LOC~~ SOPN
25.0000 [IU] | PEN_INJECTOR | Freq: Every day | SUBCUTANEOUS | 6 refills | Status: DC
Start: 2024-01-30 — End: 2024-03-20
  Filled 2024-01-30: qty 21, 84d supply, fill #0

## 2024-01-30 MED ORDER — AMOXICILLIN-POT CLAVULANATE 875-125 MG PO TABS
1.0000 | ORAL_TABLET | Freq: Two times a day (BID) | ORAL | 0 refills | Status: DC
Start: 2024-01-30 — End: 2024-04-18
  Filled 2024-01-30: qty 20, 10d supply, fill #0

## 2024-01-30 MED ORDER — CARVEDILOL 25 MG PO TABS
25.0000 mg | ORAL_TABLET | Freq: Two times a day (BID) | ORAL | 1 refills | Status: DC
  Filled 2024-01-30: qty 180, 90d supply, fill #0
  Filled 2024-04-25: qty 180, 90d supply, fill #1

## 2024-01-30 MED ORDER — BENZONATATE 100 MG PO CAPS
100.0000 mg | ORAL_CAPSULE | Freq: Two times a day (BID) | ORAL | 0 refills | Status: DC | PRN
  Filled 2024-01-30: qty 20, 10d supply, fill #0

## 2024-01-30 MED ORDER — TRULICITY 3 MG/0.5ML ~~LOC~~ SOAJ
3.0000 mg | SUBCUTANEOUS | 1 refills | Status: DC
Start: 2024-01-30 — End: 2024-03-20
  Filled 2024-01-30: qty 6, 84d supply, fill #0

## 2024-01-30 NOTE — Progress Notes (Addendum)
 Subjective:  Patient ID: Amy Chaney, female    DOB: November 20, 1970  Age: 53 y.o. MRN: [id number]  CC: Medical Management of Chronic Issues (Chest congestion and runny nose/)     Discussed the use of AI scribe software for clinical note transcription with the patient, who gave verbal consent to proceed.  History of Present Illness Amy Chaney is a 53 year old female with a history of Type 2 diabetes mellitus (A1c 7.8), hypertension, CHF (EF 50 to 55% from echo of 02/2021), CAD (status post PCI to LAD with 3 overlapping DES in 09/2017), STEMI in 06/2018.  who presents with a persistent cough and cold symptoms.  She has experienced a persistent cough and cold symptoms for two weeks. The cough is accompanied by a sensation of mucus in her throat that does not come up and worsens when lying down at night. Tylenol  and Aqua Salsa day and night provide some relief. She denies significant fever or chills but has body aches localized to her chest area. She believes she contracted the illness from a colleague.  Her hypertension management was recently adjusted, discontinuing hydralazine  due to dizziness at her cardiology visit. Current blood pressure is 144/85 mmHg, possibly elevated due to stress from needing to find a new residence.  Her last A1c was 9.1, increased from 7.2. She continues Trulicity  1.5 mg weekly and Lantus  25 units daily without interruption.  She has coronary artery disease with multiple stents placed in 2019. She takes Plavix  every other day (states she was informed by her cardiologist to do so) carvedilol , and Entresto . She takes famotidine  for acid reflux but not regularly. No current leg swelling is noted.    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 s/p PCI of the mid RCA 01/2013 //  b. LHC 9/14: EF 55%, RCA stent 100% occluded, LAD irregularities >>  subacute  stent thrombosis, Promus DES PCI distal overlap // c) 09/2017 - Ant STEMI - LAD-D2 PCI - Successful PCI of LAD (3 overlapping DES), unable to restore flow down D2l that was stented as well.  Likely related to downstream dissection, but unable to rewire.   Daily headache    Depression    Dyslipidemia, goal LDL below 70    History of Doppler ultrasound    carotid bruit >> a. 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  EF down to 20-25% global hypokinesis.  Was in the setting of grieving over the loss of her son from MI.  ->  EF improved to 45-50% on follow-up echocardiogram.   Hypertension    Ischemic cardiomyopathy 06/2018   s/p inferior STEMI followed by 2 anterior STEMIs: Following recovery from Takotsubo Cardiomyopathy- Echo 02/14/19: Moderate LVH.  EF improved up to 45-50%.  GR 1 DD.  Severe apical akinesis.  Normal valves.   Mild dysplasia of cervix (CIN I) 02/04/2019   Seen after colposcopy on 01/23/19 done for ASCU +HPV pap  > repeat pap and HPV test in 12 and 24 months   STEMI involving left anterior descending coronary artery (HCC) 09/2017; 06/2018   a) Severe Medina 1, 1, 1 LAD-D2 lesion (complicated by post PTCA dissection/intramural hematoma) -successful extensive PCI of the LAD but unable to maintain patency of the stented D2. b) distal mLAD stent Edge  100% thrombosis --> PTCA & Overlapping DES PCI (Synergy 3 x 12 --> 3.6-3.3 mm). D2 occluded. RCA stents patent, 65 % OM2. EF 30-35%.   STEMI involving oth coronary artery of inferior wall (HCC) 03/2013   Secondary to subacute stent thrombosis of RCA stent segment having missed 4 doses of Effient .   Tobacco abuse 01/11/2016   Type II diabetes mellitus (HCC) 12/2010   sister and son also diabetic     Past Surgical History:  Procedure Laterality Date   CHOLECYSTECTOMY  ~ 2000   CORONARY STENT INTERVENTION N/A 10/10/2017    Procedure: CORONARY STENT INTERVENTION;  Surgeon: Anner Alm ORN, MD;  Location: [facility] INVASIVE CV LAB;  Service: Cardiovascular; LAD PCI: STENT SYNERGY DES 3.5X32 (p),STENT SYNERGY DES 3.5X 8 (m), STENT SYNERGY DES 3.5X28 (d).  D2 PCI: STENT SYNERGY DES 2.5X24.  (UNABL TO REWIRE & EXPAND - TO OF DIAG AT END OF CASE)   CORONARY STENT INTERVENTION N/A 07/03/2018   Procedure: CORONARY STENT INTERVENTION;  Surgeon: Anner Alm ORN, MD;  Location: [facility] INVASIVE CV LAB;; PTCA & overlapping DES PCI (overlaps prior stents distally) - Synergy DES 3 x 12 (3.6 mm @ overlap -- 3.3 mm distally)>   LEFT HEART CATH AND CORONARY ANGIOGRAPHY N/A 10/10/2017   Procedure: LEFT HEART CATH AND CORONARY ANGIOGRAPHY;  Surgeon: Anner Alm ORN, MD;  Location: [facility] INVASIVE CV LAB;  Service: Cardiovascular;  Laterality: N/A; -patent RCA stents with mild in-stent restenosis. Medina 1,1,1 mLD-D2 90% (complicated by dissection/intramural thrombus)   LEFT HEART CATH AND CORONARY ANGIOGRAPHY N/A 07/03/2018   Procedure: LEFT HEART CATH AND CORONARY ANGIOGRAPHY;  Surgeon: Anner Alm ORN, MD;  Location: [facility] INVASIVE CV LAB;; 100% thrombotic occlusion distal LAD stent -- DES PCI. continued 100% D2 (previously lost). patent RCA stents, 65% OM2.  EF 30-35%.   LEFT HEART CATHETERIZATION WITH CORONARY ANGIOGRAM N/A 02/25/2013   Procedure: LEFT HEART CATHETERIZATION WITH CORONARY ANGIOGRAM;  Surgeon: Erick JONELLE Bergamo, MD;  Location: [facility] CATH LAB;  Service: Cardiovascular;; CTO m RCA; otherwise normal coronaries   LEFT HEART CATHETERIZATION WITH CORONARY ANGIOGRAM N/A 04/01/2013   Procedure: LEFT HEART CATHETERIZATION WITH CORONARY ANGIOGRAM;  Surgeon: Erick JONELLE Bergamo, MD;  Location: [facility] CATH LAB;  Service: Cardiovascular: 100% occlusion of distal RCA stent (subacute stent thrombosis -  secondary to stopping Effient ).  Overlapping DES PCI   NM MYOVIEW  LTD  12/2015    EF 38%.  Hypertensive response to exercise (231/132 mmHg).  INTERMEDIATE RISK due to  -diffuse hypokinesis and reduced EF.  No ischemia or infarction.   PERCUTANEOUS CORONARY STENT INTERVENTION (PCI-S)  04/01/2013   Procedure: PERCUTANEOUS CORONARY STENT INTERVENTION (PCI-S);  Surgeon: Erick JONELLE Bergamo, MD;  Location[facility] CATH LAB;  Service: Cardiovascular;; PCI of distal RCA stent subacute thrombosis: Promus Premier DES 3.0 mm x 20 mm.   PERCUTANEOUS CORONARY STENT INTERVENTION (PCI-S)  02/25/2013   Procedure: PERCUTANEOUS CORONARY STENT INTERVENTION (PCI-S);  Surgeon: Erick JONELLE Bergamo, MD;  Location: [facility] CATH LAB;  RCA CTO PCI with overlapping Promus DES: 3.0 mm x 38 mm, 3.5 mm x 16mm   RIGHT/LEFT HEART CATH AND CORONARY ANGIOGRAPHY N/A 11/04/2019   Procedure: RIGHT/LEFT HEART CATH AND CORONARY ANGIOGRAPHY;  Surgeon: Anner Alm ORN, MD;  Location: [facility] INVASIVE CV LAB;; Mildly elevated LVEDP.  Ostial D1 100%.  Proximal to mid LAD stent 10% ISR.  Mid and distal stent widely patent.  30% distal distance.  Ostial OM 2 55%.  Proximal to mid RCA stent 50% stenosis.  Mid  to distal RCA 40%.  Suspect that reduced EF is related to hypertensive emergen   TRANSTHORACIC ECHOCARDIOGRAM  07/04/2018   recurrent Anterior STEMI: EF 35-40%.  Apical hypokinesis.  GRII DD.  No thrombus noted.  (Improved EF from 30% up to 35-40% from previous echo)   TRANSTHORACIC ECHOCARDIOGRAM  11/2018; 01/2019   a) TAKOTUSBO CM: EF 20-25%-diffuse HK..  Mod V thickness.  GRII DD w/ high LVEDP.  Severe LA dilation;; b) 02/14/19: Moderate LVH.  EF improved up to 45-50%.  GR 1 DD.  Severe apical akinesis.  Normal valves.   TRANSTHORACIC ECHOCARDIOGRAM  10/31/2019   Hypertensive emergency, hypoxic/hypercapnic respiratory failure;  EF 20 to 25%.  Global HK.  Mildly dilated LV.  Mildly elevated PA pressures.  Mild LA dilation.  Mild AR.  Mildly elevated RAP-8 mmHg.=> Eventual cath showed no change   TUBAL LIGATION  1994    Family History  Problem Relation Age of Onset   Diabetes Mother    Diabetes Sister    Diabetes Sister     Diabetes Son        type 1   Liver disease Neg Hx    Colon cancer Neg Hx    Esophageal cancer Neg Hx     Social History   Socioeconomic History   Marital status: Single    Spouse name: Not on file   Number of children: 3   Years of education: Not on file   Highest education level: 11th grade  Occupational History    Employer: [employer]   Occupation: pca  Tobacco Use   Smoking status: Every Day    Current packs/day: 0.25    Average packs/day: 0.3 packs/day for 22.0 years (5.5 ttl pk-yrs)    Types: Cigarettes   Smokeless tobacco: Never   Tobacco comments:    current smoker 4-5 cigarettes per day; previously smoked at least a pack a day.        6 AM cigarette-06-15-23  Vaping Use   Vaping status: Never Used  Substance and Sexual Activity   Alcohol use: Not Currently    Comment: 07/04/2018 only on special occasions; maybe once/yr   Drug use: Not Currently    Types: Marijuana    Comment: 02/25/2013 quit marijuana ~ 2001   Sexual activity: Not Currently    Birth control/protection: Surgical, Post-menopausal  Other Topics Concern   Not on file  Social History Narrative   Diet: 50/50 take out and home cook meals (eats burgers and lots of fried foods), limits portions.       Exercise: walks 15-20 minutes twice per week   Social Drivers of Health   Financial Resource Strain: High Risk (04/03/2023)   Overall Financial Resource Strain (CARDIA)    Difficulty of Paying Living Expenses: Very hard  Food Insecurity: Food Insecurity Present (12/12/2022)   Hunger Vital Sign    Worried About Running Out of Food in the Last Year: Sometimes true    Ran Out of Food in the Last Year: Sometimes true  Transportation Needs: No Transportation Needs (12/12/2022)   PRAPARE - Administrator, Civil Service (Medical): No    Lack of Transportation (Non-Medical): No  Physical Activity: Inactive (12/12/2022)   Exercise Vital Sign    Days of Exercise per Week: 0 days    Minutes of  Exercise per Session: 0 min  Stress: No Stress Concern Present (12/12/2022)   [employer] of Occupational Health - Occupational Stress Questionnaire    Feeling of Stress :  Only a little  Social Connections: Moderately Integrated (12/12/2022)   Social Connection and Isolation Panel    Frequency of Communication with Friends and Family: Three times a week    Frequency of Social Gatherings with Friends and Family: Three times a week    Attends Religious Services: 1 to 4 times per year    Active Member of Clubs or Organizations: Yes    Attends Banker Meetings: 1 to 4 times per year    Marital Status: Divorced    Allergies  Allergen Reactions   Farxiga  [Dapagliflozin ] Other (See Comments)    Caused UTI and polyps on kidneys     Outpatient Medications Prior to Visit  Medication Sig Dispense Refill   clopidogrel  (PLAVIX ) 75 MG tablet Take 1 tablet (75 mg total) by mouth daily. 90 tablet 1   Continuous Glucose Sensor (DEXCOM G7 SENSOR) MISC Use to check blood sugar throughout the day. Change sensors once every 10 days. E11.65 3 each 6   famotidine  (PEPCID ) 20 MG tablet Take 1 tablet (20 mg total) by mouth at bedtime. 90 tablet 1   Insulin  Pen Needle (TECHLITE PEN NEEDLES) 32G X 4 MM MISC Use 1 pen needle with insulin  once daily 100 each 2   Multiple Vitamins-Minerals (WOMENS 50+ MULTI VITAMIN PO) Take 1 tablet by mouth daily.     nitroGLYCERIN  (NITROSTAT ) 0.4 MG SL tablet Place 1 tablet (0.4 mg total) under the tongue every 5 (five) minutes as needed for chest pain. Reported on 11/25/2015 25 tablet 3   rosuvastatin  (CRESTOR ) 40 MG tablet Take 1 tablet (40 mg total) by mouth daily. 90 tablet 2   sacubitril -valsartan  (ENTRESTO ) 97-103 MG Take 1 tablet by mouth 2 (two) times daily. 180 tablet 2   spironolactone  (ALDACTONE ) 25 MG tablet Take 1 tablet (25 mg total) by mouth daily. 90 tablet 3   TRUEPLUS LANCETS 28G MISC 28 g by Does not apply route 4 (four) times daily. 120 each 2    carvedilol  (COREG ) 25 MG tablet Take 1 tablet (25 mg total) by mouth 2 (two) times daily. 180 tablet 1   Dulaglutide  (TRULICITY ) 1.5 MG/0.5ML SOAJ Inject 1.5 mg into the skin once a week. 6 mL 1   insulin  glargine (LANTUS  SOLOSTAR) 100 UNIT/ML Solostar Pen Inject 20 Units into the skin daily. 30 mL 6   cyclobenzaprine  (FLEXERIL ) 10 MG tablet Take 1 tablet (10 mg total) by mouth 3 (three) times daily as needed for muscle spasms. (Patient not taking: Reported on 01/30/2024) 30 tablet 0   No facility-administered medications prior to visit.     ROS Review of Systems  Constitutional:  Negative for activity change and appetite change.  HENT:  Positive for congestion and rhinorrhea. Negative for sinus pressure and sore throat.   Respiratory:  Positive for cough. Negative for chest tightness, shortness of breath and wheezing.   Cardiovascular:  Negative for chest pain and palpitations.  Gastrointestinal:  Negative for abdominal distention, abdominal pain and constipation.  Genitourinary: Negative.   Musculoskeletal: Negative.   Psychiatric/Behavioral:  Negative for behavioral problems and dysphoric mood.     Objective:  BP 128/81   Pulse 82   Ht 5' 9 (1.753 m)   Wt 221 lb 3.2 oz (100.3 kg)   LMP 10/20/2015   SpO2 100%   BMI 32.67 kg/m      01/30/2024    3:04 PM 01/30/2024    2:27 PM 10/31/2023    2:07 PM  BP/Weight  Systolic BP 128  144 110  Diastolic BP 81 85 71  Wt. (Lbs)  221.2 221.4  BMI  32.67 kg/m2 32.7 kg/m2      Physical Exam Constitutional:      Appearance: She is well-developed.  Cardiovascular:     Rate and Rhythm: Normal rate.     Heart sounds: Normal heart sounds. No murmur heard. Pulmonary:     Effort: Pulmonary effort is normal.     Breath sounds: Normal breath sounds. No wheezing or rales.  Chest:     Chest wall: No tenderness.  Abdominal:     General: Bowel sounds are normal. There is no distension.     Palpations: Abdomen is soft. There is no mass.      Tenderness: There is no abdominal tenderness.  Musculoskeletal:        General: Normal range of motion.     Right lower leg: No edema.     Left lower leg: No edema.  Neurological:     Mental Status: She is alert and oriented to person, place, and time.  Psychiatric:        Mood and Affect: Mood normal.        Latest Ref Rng & Units 04/13/2023    8:59 AM 07/06/2022    3:56 PM 03/17/2022    3:30 PM  CMP  Glucose 70 - 99 mg/dL 841  745  72   BUN 6 - 24 mg/dL 20  9  22    Creatinine 0.57 - 1.00 mg/dL 8.86  8.78  8.35   Sodium 134 - 144 mmol/L 139  134  142   Potassium 3.5 - 5.2 mmol/L 4.0  3.7  3.6   Chloride 96 - 106 mmol/L 102  100  99   CO2 20 - 29 mmol/L 22  23  25    Calcium  8.7 - 10.2 mg/dL 9.2  9.0  9.4   Total Protein 6.0 - 8.5 g/dL 7.1  7.5    Total Bilirubin 0.0 - 1.2 mg/dL 0.4  0.7    Alkaline Phos 44 - 121 IU/L 45  39    AST 0 - 40 IU/L 15  20    ALT 0 - 32 IU/L 10  15      Lipid Panel     Component Value Date/Time   CHOL 113 04/13/2023 0859   TRIG 97 04/13/2023 0859   HDL 44 04/13/2023 0859   CHOLHDL 2.6 04/13/2023 0859   CHOLHDL 4.2 07/05/2018 0330   VLDL 15 07/05/2018 0330   LDLCALC 51 04/13/2023 0859    CBC    Component Value Date/Time   WBC 11.2 (H) 04/13/2023 0859   WBC 3.5 (L) 07/06/2022 1556   RBC 4.40 04/13/2023 0859   RBC 4.37 07/06/2022 1556   HGB 11.9 04/13/2023 0859   HCT 37.2 04/13/2023 0859   PLT 361 04/13/2023 0859   MCV 85 04/13/2023 0859   MCH 27.0 04/13/2023 0859   MCH 27.0 07/06/2022 1556   MCHC 32.0 04/13/2023 0859   MCHC 33.3 07/06/2022 1556   RDW 13.4 04/13/2023 0859   LYMPHSABS 0.9 07/06/2022 1556   LYMPHSABS 3.1 03/14/2022 1653   MONOABS 0.6 07/06/2022 1556   EOSABS 0.2 07/06/2022 1556   EOSABS 0.4 03/14/2022 1653   BASOSABS 0.1 07/06/2022 1556   BASOSABS 0.1 03/14/2022 1653    Lab Results  Component Value Date   HGBA1C 9.1 (A) 01/30/2024    Lab Results  Component Value Date   HGBA1C 9.1 (A) 01/30/2024  HGBA1C  7.2 (H) 10/31/2023   HGBA1C 7.8 (A) 06/05/2023      1. Type 2 diabetes mellitus with other specified complication, with long-term current use of insulin  (HCC) (Primary) Uncontrolled with A1C of 9.1 Increase Trulicity  from 1.5 to 3.0 mg, continue all other medications Counseled on Diabetic diet, the healthy plate, 849 minutes of moderate intensity exercise/week Blood sugar logs with fasting goals of 80-120 mg/dl, random of less than 819 and in the event of sugars less than 60 mg/dl or greater than 599 mg/dl encouraged to notify the clinic. Advised on the need for annual eye exams, annual foot exams, Pneumonia vaccine.  - POCT glycosylated hemoglobin (Hb A1C) - Microalbumin / creatinine urine ratio; Future - Dulaglutide  (TRULICITY ) 3 MG/0.5ML SOAJ; Inject 3 mg as directed once a week.  Dispense: 6 mL; Refill: 1 - insulin  glargine (LANTUS  SOLOSTAR) 100 UNIT/ML Solostar Pen; Inject 25 Units into the skin daily.  Dispense: 30 mL; Refill: 6 - CMP14+EGFR; Future - LP+Non-HDL Cholesterol; Future  2. Long term current use of insulin  (HCC) #1 above - insulin  glargine (LANTUS  SOLOSTAR) 100 UNIT/ML Solostar Pen; Inject 25 Units into the skin daily.  Dispense: 30 mL; Refill: 6  3. Primary hypertension Controlled Continue antihypertensives - carvedilol  (COREG ) 25 MG tablet; Take 1 tablet (25 mg total) by mouth 2 (two) times daily.  Dispense: 180 tablet; Refill: 1  4. Hypertensive heart disease with chronic combined systolic and diastolic congestive heart failure (HCC) EF of 50 to 55% from echo of 2022 Euvolemic Continue Entresto , spironolactone  Hydralazine  was discontinued by cardiology due to dizziness with notes to restart this if systolic BP is greater than 150 Continue follow-up with cardiology  5. Carotid artery disease, unspecified laterality, unspecified type (HCC) Risk factor modification Continue statin  6. Coronary artery disease involving native coronary artery of native heart  with unstable angina pectoris (HCC) Status post multiple stents Risk factor modification including diabetes, blood pressure and lipid control Continue statin She is more than 12 months out from her stent placement.  Per patient she was advised to take Plavix  every other day by cardiology  7. Other sinusitis, unspecified chronicity Will place on antibiotic due to prolonged duration of symptoms - amoxicillin -clavulanate (AUGMENTIN ) 875-125 MG tablet; Take 1 tablet by mouth 2 (two) times daily.  Dispense: 20 tablet; Refill: 0 - benzonatate  (TESSALON ) 100 MG capsule; Take 1 capsule (100 mg total) by mouth 2 (two) times daily as needed for cough.  Dispense: 20 capsule; Refill: 0  8. Long-term current use of injectable noninsulin antidiabetic medication See #1 above - Dulaglutide  (TRULICITY ) 3 MG/0.5ML SOAJ; Inject 3 mg as directed once a week.  Dispense: 6 mL; Refill: 1  Meds ordered this encounter  Medications   Dulaglutide  (TRULICITY ) 3 MG/0.5ML SOAJ    Sig: Inject 3 mg as directed once a week.    Dispense:  6 mL    Refill:  1    Dose increase   insulin  glargine (LANTUS  SOLOSTAR) 100 UNIT/ML Solostar Pen    Sig: Inject 25 Units into the skin daily.    Dispense:  30 mL    Refill:  6   carvedilol  (COREG ) 25 MG tablet    Sig: Take 1 tablet (25 mg total) by mouth 2 (two) times daily.    Dispense:  180 tablet    Refill:  1   amoxicillin -clavulanate (AUGMENTIN ) 875-125 MG tablet    Sig: Take 1 tablet by mouth 2 (two) times daily.    Dispense:  20  tablet    Refill:  0   benzonatate  (TESSALON ) 100 MG capsule    Sig: Take 1 capsule (100 mg total) by mouth 2 (two) times daily as needed for cough.    Dispense:  20 capsule    Refill:  0    Return in about 3 months (around 05/01/2024) for Chronic medical conditions.       Corrina Sabin, MD, FAAFP. [facility] [place], [place] [phone]   01/31/2024, 2:24 PM

## 2024-01-30 NOTE — Patient Instructions (Signed)
 VISIT SUMMARY:  During today's visit, we discussed your persistent cough and cold symptoms, as well as your ongoing management of diabetes, hypertension, coronary artery disease, and acid reflux. We reviewed your current medications and made some adjustments to better manage your conditions.  YOUR PLAN:  -UPPER RESPIRATORY TRACT INFECTION: You have a persistent cough and mucus in your throat, which has lasted for two weeks. This is likely due to an upper respiratory tract infection. We have prescribed Augmentin, an antibiotic, and a cough medication to help alleviate your symptoms. If your symptoms worsen, please seek further evaluation.  -TYPE 2 DIABETES MELLITUS: Your recent A1c level of 9.1 indicates that your blood sugar levels are not well controlled. To improve this, we are increasing your Trulicity  dose to 3 mg weekly while continuing your Lantus  at 25 units daily. We have also ordered fasting blood tests, including cholesterol, to monitor your condition.  -HYPERTENSION: Your blood pressure is currently 144/85 mmHg, which is slightly elevated. We have discontinued hydralazine  due to dizziness and will continue with your current blood pressure medications, carvedilol  and Entresto . We will also arrange for a social worker to assist you with your housing issues, which may be contributing to your stress and elevated blood pressure.  -CORONARY ARTERY DISEASE WITH STENT PLACEMENT: Given your history of coronary artery disease and stent placement, it is important to clarify the long-term use of Plavix  with your cardiologist. We will ensure you have refills for Plavix  and discuss its continuation with your cardiologist to manage your thrombotic risk.  -GASTROESOPHAGEAL REFLUX DISEASE: You have not been taking famotidine  regularly for your acid reflux. We recommend discussing the need for ongoing management of GERD if your symptoms recur.  INSTRUCTIONS:  Please follow up with the fasting blood tests  as ordered. We will also arrange for a social worker to assist you with your housing issues. Additionally, please discuss the long-term use of Plavix  with your cardiologist. If your symptoms worsen or if you have any concerns, do not hesitate to seek further evaluation.

## 2024-01-30 NOTE — Telephone Encounter (Signed)
 I met with the patient when she was in the clinic today. She explained that she received notice from her landlord that she needs to be out of her home by 02/07/2024 as she now owes $1700 rent and she is unable to afford that.  She stated she has not received a notice of a court date.   She explained that she is struggling to pay all of her bills. Her sister who uses a wheelchair for mobility is hoping to live with her. The patient is currently on the 3rd floor of her home and her sister is not able to navigate stairs. The patient said she has been looking for housing for a week and has one possible option for housing but she is not sure that the application will be approved.   I explained to her that I can refer her to [employer] [facility]) for possible assistance working with her landlord to determine if there is any way she could remain in her current home while she looks for a new home.  I told her that there is no guarantee that [facility] will be able to help; but we can try.  She was in agreement with placing the referral and I placed the [facility] referral as discussed.   I also shared with the the website: [email or web address] and explained the various resources that are available and she saved the site on her phone

## 2024-01-31 ENCOUNTER — Other Ambulatory Visit: Payer: Self-pay

## 2024-02-05 ENCOUNTER — Ambulatory Visit: Attending: Family Medicine

## 2024-02-05 ENCOUNTER — Other Ambulatory Visit: Payer: Self-pay

## 2024-02-05 DIAGNOSIS — E1169 Type 2 diabetes mellitus with other specified complication: Secondary | ICD-10-CM

## 2024-02-06 ENCOUNTER — Telehealth: Payer: Self-pay | Admitting: Family Medicine

## 2024-02-06 NOTE — Telephone Encounter (Signed)
 Copied from CRM [id number]. Topic: Clinical - Lab/Test Results  >> Feb 06, 2024  1:04 PM Wess RAMAN wrote: Reason for CRM: Patient would like to go over her test results.  Callback #: [id number]

## 2024-02-07 ENCOUNTER — Ambulatory Visit: Payer: Self-pay | Admitting: Family Medicine

## 2024-02-07 LAB — CMP14+EGFR
ALT: 10 IU/L (ref 0–32)
AST: 12 IU/L (ref 0–40)
Albumin: 4.1 g/dL (ref 3.8–4.9)
Alkaline Phosphatase: 52 IU/L (ref 44–121)
BUN/Creatinine Ratio: 14 (ref 9–23)
BUN: 26 mg/dL — ABNORMAL HIGH (ref 6–24)
Bilirubin Total: 0.3 mg/dL (ref 0.0–1.2)
CO2: 20 mmol/L (ref 20–29)
Calcium: 9.4 mg/dL (ref 8.7–10.2)
Chloride: 104 mmol/L (ref 96–106)
Creatinine, Ser: 1.82 mg/dL — ABNORMAL HIGH (ref 0.57–1.00)
Globulin, Total: 3.5 g/dL (ref 1.5–4.5)
Glucose: 190 mg/dL — ABNORMAL HIGH (ref 70–99)
Potassium: 4.4 mmol/L (ref 3.5–5.2)
Sodium: 139 mmol/L (ref 134–144)
Total Protein: 7.6 g/dL (ref 6.0–8.5)
eGFR: 33 mL/min/1.73 — ABNORMAL LOW (ref >59)

## 2024-02-07 LAB — MICROALBUMIN / CREATININE URINE RATIO
Creatinine, Urine: 118.2 mg/dL
Microalb/Creat Ratio: 156 mg/g{creat} — AB (ref 0–29)
Microalbumin, Urine: 184.4 ug/mL

## 2024-02-07 LAB — LP+NON-HDL CHOLESTEROL
Cholesterol, Total: 105 mg/dL (ref 100–199)
HDL: 44 mg/dL (ref >39)
LDL Chol Calc (NIH): 45 mg/dL (ref 0–99)
Total Non-HDL-Chol (LDL+VLDL): 61 mg/dL (ref 0–129)
Triglycerides: 77 mg/dL (ref 0–149)
VLDL Cholesterol Cal: 16 mg/dL (ref 5–40)

## 2024-02-07 NOTE — Telephone Encounter (Signed)
 Patient was called and informed that she will be called once results are read by provider.

## 2024-02-07 NOTE — Telephone Encounter (Signed)
 All her labs have not been fully resulted and I have been waiting for her microalbumin so I can results all of them together.

## 2024-02-11 ENCOUNTER — Telehealth: Payer: Self-pay | Admitting: Family Medicine

## 2024-02-11 NOTE — Telephone Encounter (Signed)
 PT drop off forms she said she need them complete by Wednesday I informed her takes 7-14 business days but will let her know she needs it sooner

## 2024-02-12 ENCOUNTER — Telehealth: Payer: Self-pay

## 2024-02-12 NOTE — Telephone Encounter (Signed)
 Done

## 2024-02-12 NOTE — Telephone Encounter (Signed)
 Patient states that she needs a letter saying that she is homeless and listing her medical conditions. Patient states she is needing this letter by tomorrow.

## 2024-02-13 ENCOUNTER — Telehealth: Payer: Self-pay

## 2024-02-13 NOTE — Telephone Encounter (Signed)
 Called patient and made her aware of letter/paperwork being ready and she stated that she will pick it up today

## 2024-02-14 NOTE — Telephone Encounter (Signed)
Refer to other message

## 2024-02-22 ENCOUNTER — Other Ambulatory Visit: Payer: Self-pay

## 2024-02-29 ENCOUNTER — Other Ambulatory Visit: Payer: Self-pay

## 2024-03-07 ENCOUNTER — Other Ambulatory Visit: Payer: Self-pay | Admitting: Cardiology

## 2024-03-07 ENCOUNTER — Other Ambulatory Visit: Payer: Self-pay

## 2024-03-07 MED ORDER — SACUBITRIL-VALSARTAN 97-103 MG PO TABS
1.0000 | ORAL_TABLET | Freq: Two times a day (BID) | ORAL | 2 refills | Status: AC
  Filled 2024-03-07: qty 180, 90d supply, fill #0
  Filled 2024-03-07: qty 60, 30d supply, fill #0
  Filled 2024-04-04: qty 60, 30d supply, fill #1
  Filled 2024-05-07: qty 60, 30d supply, fill #2
  Filled 2024-06-06: qty 60, 30d supply, fill #3
  Filled 2024-07-10: qty 60, 30d supply, fill #4
  Filled 2024-08-07: qty 60, 30d supply, fill #5
  Filled 2024-09-04: qty 60, 30d supply, fill #6

## 2024-03-08 ENCOUNTER — Other Ambulatory Visit: Payer: Self-pay

## 2024-03-10 ENCOUNTER — Other Ambulatory Visit: Payer: Self-pay

## 2024-03-19 ENCOUNTER — Ambulatory Visit: Payer: Self-pay

## 2024-03-19 NOTE — Telephone Encounter (Signed)
 VV Thursday with provider

## 2024-03-19 NOTE — Telephone Encounter (Signed)
 FYI Only or Action Required?: Action required by provider: clinical question for provider. Next steps, please advise pt  Patient was last seen in primary care on 01/30/2024 by Newlin, Enobong, MD.  Called Nurse Triage reporting Medication Problem.  Symptoms began about a month ago.  Interventions attempted: Rest, hydration, or home remedies.  Symptoms are: unchanged.  Triage Disposition: Call PCP Now  Patient/caregiver understands and will follow disposition?: No, wishes to speak with PCP  Copied from CRM #[id number]. Topic: Clinical - Red Word Triage >> Mar 19, 2024 11:02 AM Zy'onna H wrote: [facility] that prompted transfer to Nurse Triage:  Latest update from MD to up dosage of Trulicity  Causing intense stomach pain/cramping Constantly throwing up when trying to ingest food (even water)   WM Transfer NT Reason for Disposition  [1] Caller has URGENT medicine question about med that primary care doctor (or NP/PA) or specialist prescribed AND [2] triager unable to answer question  Answer Assessment - Initial Assessment Questions 1. NAME of MEDICINE: What medicine(s) are you calling about?     Trulicity  2. QUESTION: What is your question? (e.g., double dose of medicine, side effect)     Increased dose, pt believes that was in June, has been sick with N/V since the 1st or 2nd dose increase 3. PRESCRIBER: Who prescribed the medicine? Reason: if prescribed by specialist, call should be referred to that group.     PCP 4. SYMPTOMS: Do you have any symptoms? If Yes, ask: What symptoms are you having?  How bad are the symptoms (e.g., mild, moderate, severe)     N/V  Pt states that before the dose increase she was also having dizziness upon standing.  Protocols used: Medication Question Call-A-AH

## 2024-03-20 ENCOUNTER — Other Ambulatory Visit: Payer: Self-pay

## 2024-03-20 ENCOUNTER — Encounter: Payer: Self-pay | Admitting: Family Medicine

## 2024-03-20 ENCOUNTER — Ambulatory Visit: Attending: Family Medicine | Admitting: Family Medicine

## 2024-03-20 DIAGNOSIS — R112 Nausea with vomiting, unspecified: Secondary | ICD-10-CM | POA: Diagnosis not present

## 2024-03-20 DIAGNOSIS — E1169 Type 2 diabetes mellitus with other specified complication: Secondary | ICD-10-CM

## 2024-03-20 DIAGNOSIS — R1012 Left upper quadrant pain: Secondary | ICD-10-CM

## 2024-03-20 DIAGNOSIS — Z794 Long term (current) use of insulin: Secondary | ICD-10-CM

## 2024-03-20 MED ORDER — LANTUS SOLOSTAR 100 UNIT/ML ~~LOC~~ SOPN: 27.0000 [IU] | PEN_INJECTOR | Freq: Every day | SUBCUTANEOUS | Status: DC

## 2024-03-20 MED ORDER — TRULICITY 1.5 MG/0.5ML ~~LOC~~ SOAJ
1.5000 mg | SUBCUTANEOUS | 6 refills | Status: AC
  Filled 2024-03-20: qty 2, 28d supply, fill #0
  Filled 2024-04-18 – 2024-04-30 (×3): qty 2, 28d supply, fill #1
  Filled 2024-06-27: qty 2, 28d supply, fill #2
  Filled 2024-07-22: qty 2, 28d supply, fill #3
  Filled 2024-08-29 (×2): qty 2, 28d supply, fill #4

## 2024-03-20 NOTE — Progress Notes (Signed)
 Virtual Visit via Video Note  I connected with Amy Chaney, on 03/20/2024 at 12:34 PM by video enabled telemedicine device and verified that I am speaking with the correct person using two identifiers.   Consent: I discussed the limitations, risks, security and privacy concerns of performing an evaluation and management service by telemedicine and the availability of in person appointments. I also discussed with the patient that there may be a patient responsible charge related to this service. The patient expressed understanding and agreed to proceed.   Location of Patient: Out and about  Location of Provider: Clinic   Persons participating in Telemedicine visit: DANNI SHIMA Dr. Delbert    Discussed the use of AI scribe software for clinical note transcription with the patient, who gave verbal consent to proceed.  History of Present Illness Amy Chaney is a 53 year old female with  a history of Type 2 diabetes mellitus (A1c 7.8), hypertension, CHF (EF 50 to 55% from echo of 02/2021), CAD (status post PCI to LAD with 3 overlapping DES in 09/2017), STEMI in 06/2018.  who presents with abdominal pain and vomiting.  She experiences severe, intense left-sided abdominal pain for the past week, accompanied by vomiting that prevents her from keeping food down. She is unable to eat due to fear of vomiting. These symptoms began after increasing her Trulicity  dose from 1.5 mg to 3 mg. At the previous dose, she did not have these issues.  Her diabetes management includes Lantus  at 20 units once daily, adjusted from 25 units by patient due to increased physical activity. Her blood sugar levels are elevated, particularly after consuming [facility], with a current fasting level of 187 mg/dL. She has not eaten today due to her symptoms. Her last A1c was 9.1% in July.      Past Medical History:  Diagnosis Date   Coronary artery disease involving native coronary artery of native  heart with angina pectoris (HCC) 01/2013   a. s/p PCI of the mid RCA 01/2013 //  b. LHC 9/14: EF 55%, RCA stent 100% occluded, LAD irregularities >>  subacute stent thrombosis, Promus DES PCI distal overlap // c) 09/2017 - Ant STEMI - LAD-D2 PCI - Successful PCI of LAD (3 overlapping DES), unable to restore flow down D2l that was stented as well.  Likely related to downstream dissection, but unable to rewire.   Daily headache    Depression    Dyslipidemia, goal LDL below 70    History of Doppler ultrasound    carotid bruit >> a. 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  EF down to 20-25% global hypokinesis.  Was in the setting of grieving over the loss of her son from MI.  ->  EF improved to 45-50% on follow-up echocardiogram.   Hypertension    Ischemic cardiomyopathy 06/2018   s/p inferior STEMI followed by 2 anterior STEMIs: Following recovery from Takotsubo Cardiomyopathy- Echo 02/14/19: Moderate LVH.  EF improved up to 45-50%.  GR 1 DD.  Severe apical akinesis.  Normal valves.   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 b) distal mLAD stent Edge 100% thrombosis --> PTCA & Overlapping DES PCI (Synergy 3 x 12 --> 3.6-3.3 mm). D2 occluded. RCA stents patent, 65 % OM2. EF 30-35%.   STEMI involving oth coronary artery of inferior wall (HCC) 03/2013   Secondary to subacute stent thrombosis of RCA stent segment having missed 4 doses of Effient .   Tobacco abuse 01/11/2016   Type II diabetes mellitus (HCC) 12/2010   sister  and son also diabetic    Allergies  Allergen Reactions   Farxiga  [Dapagliflozin ] Other (See Comments)    Caused UTI and polyps on kidneys     Current Outpatient Medications on File Prior to Visit  Medication Sig Dispense Refill   amoxicillin -clavulanate (AUGMENTIN ) 875-125 MG tablet Take 1 tablet by mouth 2 (two) times daily. 20 tablet 0   benzonatate  (TESSALON ) 100 MG capsule Take 1 capsule (100 mg total) by mouth 2 (two) times daily as needed for cough. 20 capsule 0   carvedilol  (COREG ) 25 MG tablet Take 1 tablet (25 mg total) by mouth 2 (two) times daily. 180 tablet 1   clopidogrel  (PLAVIX ) 75 MG tablet Take 1 tablet (75 mg total) by mouth daily. 90 tablet 1   Continuous Glucose Sensor (DEXCOM G7 SENSOR) MISC Use to check blood sugar throughout the day. Change sensors once every 10 days. E11.65 3 each 6   cyclobenzaprine  (FLEXERIL ) 10 MG tablet Take 1 tablet (10 mg total) by mouth 3 (three) times daily as needed for muscle spasms. (Patient not taking: Reported on 01/30/2024) 30 tablet 0   famotidine  (PEPCID ) 20 MG tablet Take 1 tablet (20 mg total) by mouth at bedtime. 90 tablet 1   Insulin  Pen Needle (TECHLITE PEN NEEDLES) 32G X 4 MM MISC Use 1 pen needle with insulin  once daily 100 each 2   Multiple Vitamins-Minerals (WOMENS 50+ MULTI VITAMIN PO) Take 1 tablet by mouth daily.     nitroGLYCERIN  (NITROSTAT ) 0.4 MG SL tablet Place 1 tablet (0.4 mg total) under the tongue every 5 (five) minutes as needed for chest pain. Reported on 11/25/2015 25 tablet 3   rosuvastatin  (CRESTOR ) 40 MG tablet Take 1 tablet (40 mg total) by mouth daily. 90 tablet 2   sacubitril -valsartan  (ENTRESTO ) 97-103 MG Take 1 tablet by mouth 2 (two) times daily. 180 tablet 2   spironolactone  (ALDACTONE ) 25 MG tablet Take 1 tablet (25 mg total) by mouth daily. 90 tablet 3   TRUEPLUS LANCETS 28G MISC 28 g by Does not apply route 4 (four) times daily. 120 each 2   [DISCONTINUED] buPROPion  (WELLBUTRIN  SR) 150 MG 12 hr tablet  Take 1 tablet (150 mg total) by mouth 2 (two) times daily. 60 tablet 3   No current facility-administered medications on file prior to visit.    ROS: See HPI  Observations/Objective: Awake, alert, oriented x3 Not in acute distress Normal mood      Latest Ref Rng & Units 02/05/2024    8:44 AM 04/13/2023    8:59 AM 07/06/2022    3:56 PM  CMP  Glucose 70 - 99 mg/dL 809  841  745   BUN 6 - 24 mg/dL 26  20  9    Creatinine 0.57 - 1.00 mg/dL 8.17  8.86  8.78   Sodium 134 - 144 mmol/L 139  139  134   Potassium 3.5 - 5.2 mmol/L 4.4  4.0  3.7  Chloride 96 - 106 mmol/L 104  102  100   CO2 20 - 29 mmol/L 20  22  23    Calcium  8.7 - 10.2 mg/dL 9.4  9.2  9.0   Total Protein 6.0 - 8.5 g/dL 7.6  7.1  7.5   Total Bilirubin 0.0 - 1.2 mg/dL 0.3  0.4  0.7   Alkaline Phos 44 - 121 IU/L 52  45  39   AST 0 - 40 IU/L 12  15  20    ALT 0 - 32 IU/L 10  10  15      Lipid Panel     Component Value Date/Time   CHOL 105 02/05/2024 0844   TRIG 77 02/05/2024 0844   HDL 44 02/05/2024 0844   CHOLHDL 2.6 04/13/2023 0859   CHOLHDL 4.2 07/05/2018 0330   VLDL 15 07/05/2018 0330   LDLCALC 45 02/05/2024 0844   LABVLDL 16 02/05/2024 0844    Lab Results  Component Value Date   HGBA1C 9.1 (A) 01/30/2024     Assessment and plan:   Assessment & Plan Type 2 diabetes mellitus Adverse gastrointestinal effects after increasing Trulicity  to 3 mg. Symptoms absent at 1.5 mg. A1c 9.1% in July, fasting glucose 187 mg/dL, indicating poor glycemic control. Dose-related adverse effects likely. - Decrease Trulicity  to 1.5 mg starting Monday. - Increase Lantus  from 20 to 27 units starting Monday. - If fasting glucose remains high, increase Lantus  to 30 units. - Monitor gastrointestinal symptoms post dose adjustment. - Contact via MyChart if symptoms persist or worsen.  Abdominal pain/nausea and vomiting Will send off pancreatic enzymes to evaluate for possible pancreatitis Increase fluid intake     Meds  ordered this encounter  Medications   Dulaglutide  (TRULICITY ) 1.5 MG/0.5ML SOAJ    Sig: Inject 1.5 mg into the skin once a week.    Dispense:  2 mL    Refill:  6    Discontinue 3mg    insulin  glargine (LANTUS  SOLOSTAR) 100 UNIT/ML Solostar Pen    Sig: Inject 27 Units into the skin daily.    Follow Up Instructions: Keep previously scheduled appointment   I discussed the assessment and treatment plan with the patient. The patient was provided an opportunity to ask questions and all were answered. The patient agreed with the plan and demonstrated an understanding of the instructions.   The patient was advised to call back or seek an in-person evaluation if the symptoms worsen or if the condition fails to improve as anticipated.     I provided 13 minutes total of Telehealth time during this encounter including median intraservice time, reviewing previous notes, investigations, ordering medications, medical decision making, coordinating care and patient verbalized understanding at the end of the visit.     Corrina Sabin, MD, FAAFP. [facility] [place], [place] [phone]   03/20/2024, 12:34 PM

## 2024-03-21 ENCOUNTER — Other Ambulatory Visit: Payer: Self-pay

## 2024-03-26 ENCOUNTER — Other Ambulatory Visit: Payer: Self-pay

## 2024-03-28 ENCOUNTER — Other Ambulatory Visit: Payer: Self-pay

## 2024-04-02 ENCOUNTER — Other Ambulatory Visit: Payer: Self-pay

## 2024-04-04 ENCOUNTER — Other Ambulatory Visit: Payer: Self-pay

## 2024-04-04 ENCOUNTER — Other Ambulatory Visit: Payer: Self-pay | Admitting: Family Medicine

## 2024-04-04 MED ORDER — DEXCOM G7 SENSOR MISC
5 refills | Status: AC
  Filled 2024-04-04: qty 3, 30d supply, fill #0
  Filled 2024-05-23: qty 3, 30d supply, fill #1
  Filled 2024-06-27: qty 3, 30d supply, fill #2
  Filled 2024-07-22: qty 3, 30d supply, fill #3
  Filled 2024-09-04: qty 3, 30d supply, fill #4

## 2024-04-04 NOTE — Telephone Encounter (Signed)
 Requested Prescriptions  Pending Prescriptions Disp Refills   Continuous Glucose Sensor (DEXCOM G7 SENSOR) MISC 3 each 5    Sig: Use to check blood sugar throughout the day. Change sensors once every 10 days. E11.65     Endocrinology: Diabetes - Testing Supplies Passed - 04/04/2024  2:52 PM      Passed - Valid encounter within last 12 months    Recent Outpatient Visits           2 weeks ago Nausea and vomiting, unspecified vomiting type   [facility] [place]. [facility] Delbert Clam, MD   2 months ago Type 2 diabetes mellitus with other specified complication, with long-term current use of insulin  [facility])   [facility] [place]. [facility] Delbert Clam, MD   5 months ago Type 2 diabetes mellitus with other specified complication, with long-term current use of insulin  [facility])   [facility] [place]. [facility] [place], [place] W, NP   10 months ago Type 2 diabetes mellitus with other specified complication, with long-term current use of insulin  [facility])   [facility] [place]. [facility] Delbert Clam, MD   1 year ago Gastroesophageal reflux disease with esophagitis, unspecified whether hemorrhage   [facility] [place]. [facility] Delbert Clam, MD

## 2024-04-09 ENCOUNTER — Other Ambulatory Visit: Payer: Self-pay

## 2024-04-10 ENCOUNTER — Encounter: Payer: Self-pay | Admitting: Physician Assistant

## 2024-04-18 ENCOUNTER — Encounter (HOSPITAL_COMMUNITY): Payer: Self-pay

## 2024-04-18 ENCOUNTER — Ambulatory Visit (HOSPITAL_COMMUNITY)
Admission: EM | Admit: 2024-04-18 | Discharge: 2024-04-18 | Disposition: A | Discharge status: Home / Self Care | Attending: Emergency Medicine | Admitting: Emergency Medicine

## 2024-04-18 ENCOUNTER — Other Ambulatory Visit: Payer: Self-pay

## 2024-04-18 ENCOUNTER — Telehealth: Payer: Self-pay | Admitting: Family Medicine

## 2024-04-18 ENCOUNTER — Ambulatory Visit (INDEPENDENT_AMBULATORY_CARE_PROVIDER_SITE_OTHER)

## 2024-04-18 DIAGNOSIS — R051 Acute cough: Secondary | ICD-10-CM | POA: Diagnosis not present

## 2024-04-18 HISTORY — DX: Disorder of kidney and ureter, unspecified: N28.9

## 2024-04-18 LAB — POC COVID19/FLU A&B COMBO
Covid Antigen, POC: NEGATIVE
Influenza A Antigen, POC: NEGATIVE
Influenza B Antigen, POC: NEGATIVE

## 2024-04-18 MED ORDER — PROMETHAZINE-DM 6.25-15 MG/5ML PO SYRP
5.0000 mL | ORAL_SOLUTION | Freq: Four times a day (QID) | ORAL | 0 refills | Status: DC | PRN
  Filled 2024-04-18 – 2024-04-25 (×2): qty 240, 12d supply, fill #0

## 2024-04-18 MED ORDER — GUAIFENESIN ER 600 MG PO TB12
1200.0000 mg | ORAL_TABLET | Freq: Two times a day (BID) | ORAL | 0 refills | Status: DC
  Filled 2024-04-18: qty 30, 8d supply, fill #0

## 2024-04-18 NOTE — Discharge Instructions (Addendum)
 Xray does not show any infection or fluid in your lungs!  I would like you to take guaifenacin extra strength twice daily. This will help with congestion and thin up mucous. Please take with LOTS of water - otherwise it won't work.   The promethazine  DM cough syrup can be used up to 4 times daily. If this medication makes you drowsy, take only once before bed.  Use your Entresto  inhaler twice daily as directed.  Continue plain tylenol  for pain and any fever  See how you are feeling in the next 4-5 days. If no improvement, please return. If worsening symptoms, go directly to the emergency department.

## 2024-04-18 NOTE — ED Triage Notes (Signed)
 Pt c/o runny nose x1wk. C/o cough and chest congestion since yesterday. Taken multiple OTC meds with temporary relief.

## 2024-04-18 NOTE — ED Provider Notes (Signed)
 [facility]    CSN: [id number] Arrival date & time: 04/18/24  1144     History   Chief Complaint Chief Complaint  Patient presents with   Cough    HPI Amy Chaney is a 53 y.o. female.  Runny nose and nasal congestion for about 5-6 days Post-nasal drip causing hoarse voice.  Yesterday she started having productive cough Some shortness of breath. Cough causes chest tightness.  Maybe had a fever at onset, no temp measured  She has tried several OTC medications with temporary relief  Sick contacts -- grandkids had similar symptoms    History of T2DM, CHF, CAD, smoking, STEMI status post coronary stent, prolonged QT, kidney disease  Reports she is taking medications as directed  Plavix , insulin , aldactone   A1c 2 months ago was 9.1   Past Medical History:  Diagnosis Date   Coronary artery disease involving native coronary artery of native heart with angina pectoris (HCC) 01/2013   a. s/p PCI of the mid RCA 01/2013 //  b. LHC 9/14: EF 55%, RCA stent 100% occluded, LAD irregularities >>  subacute stent thrombosis, Promus DES PCI distal overlap // c) 09/2017 - Ant STEMI - LAD-D2 PCI - Successful PCI of LAD (3 overlapping DES), unable to restore flow down D2l that was stented as well.  Likely related to downstream dissection, but unable to rewire.   Daily headache    Depression    Dyslipidemia, goal LDL below 70    History of Doppler ultrasound    carotid bruit >> a. 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  EF down to 20-25% global hypokinesis.  Was in the setting of grieving over the loss of her son from MI.  ->  EF improved to 45-50% on follow-up echocardiogram.   Hypertension    Ischemic cardiomyopathy 06/2018   s/p inferior STEMI followed by 2 anterior STEMIs: Following recovery from Takotsubo Cardiomyopathy- Echo 02/14/19: Moderate LVH.  EF  improved up to 45-50%.  GR 1 DD.  Severe apical akinesis.  Normal valves.   Kidney disease    Mild dysplasia of cervix (CIN I) 02/04/2019   Seen after colposcopy on 01/23/19 done for ASCU +HPV pap  > repeat pap and HPV test in 12 and 24 months   STEMI involving left anterior descending coronary artery (HCC) 09/2017; 06/2018   a) Severe Medina 1, 1, 1 LAD-D2 lesion (complicated by post PTCA dissection/intramural hematoma) -successful extensive PCI of the LAD but unable to maintain patency of the stented D2. b) distal mLAD stent Edge 100% thrombosis --> PTCA & Overlapping DES PCI (Synergy 3 x 12 --> 3.6-3.3 mm). D2 occluded. RCA stents patent, 65 % OM2. EF 30-35%.   STEMI involving oth coronary artery of inferior wall (HCC) 03/2013   Secondary to subacute stent thrombosis of RCA stent segment having missed 4 doses of Effient .   Tobacco abuse 01/11/2016   Type II diabetes mellitus (HCC) 12/2010   sister and son also diabetic     Patient Active Problem List   Diagnosis Date Noted   Dizziness 10/30/2023   Ischemic leg pain 10/29/2023   Acute pyelonephritis 07/21/2020   Pyelonephritis 07/20/2020   Trichomoniasis 03/30/2019   Mild dysplasia of cervix (CIN I) 02/04/2019   Screening breast examination 01/16/2019   Atypical squamous cell changes of undetermined significance (ASCUS) on cervical cytology with positive high risk human papilloma  virus (HPV) 11/21/2018   ST elevation myocardial infarction (STEMI) involving left anterior descending (LAD) coronary artery with complication (HCC) 07/03/2018   Dark stools 04/03/2018   Carotid artery disease (HCC) 01/11/2016   Prolonged Q-T interval on ECG 01/11/2016   Tobacco abuse counseling 01/11/2016   Tobacco abuse 01/11/2016   Morbid obesity (HCC): BMI ~36 with HTN, HLD, CAD 11/25/2015   Smoker 03/03/2015   Coronary artery disease involving native coronary artery of native heart without angina pectoris 12/09/2014   Chronic diastolic congestive heart  failure (HCC) 12/09/2014   H/O noncompliance with medical treatment, presenting hazards to health 12/09/2014   S/P primary angioplasty with coronary stent 11/18/2014   Diabetes type 2, uncontrolled 04/01/2013   Hyperlipidemia associated with type 2 diabetes mellitus (HCC) 04/01/2013   Depression 04/01/2013   Stress at home 04/01/2013   Hypertensive heart disease with chronic diastolic congestive heart failure (HCC) 09/05/2011   Type II diabetes mellitus (HCC) 12/30/2010    Past Surgical History:  Procedure Laterality Date   CHOLECYSTECTOMY  ~ 2000   CORONARY STENT INTERVENTION N/A 10/10/2017   Procedure: CORONARY STENT INTERVENTION;  Surgeon: Anner Alm ORN, MD;  Location: [facility] INVASIVE CV LAB;  Service: Cardiovascular; LAD PCI: STENT SYNERGY DES 3.5X32 (p),STENT SYNERGY DES 3.5X 8 (m), STENT SYNERGY DES 3.5X28 (d).  D2 PCI: STENT SYNERGY DES 2.5X24.  (UNABL TO REWIRE & EXPAND - TO OF DIAG AT END OF CASE)   CORONARY STENT INTERVENTION N/A 07/03/2018   Procedure: CORONARY STENT INTERVENTION;  Surgeon: Anner Alm ORN, MD;  Location: [facility] INVASIVE CV LAB;; PTCA & overlapping DES PCI (overlaps prior stents distally) - Synergy DES 3 x 12 (3.6 mm @ overlap -- 3.3 mm distally)>   LEFT HEART CATH AND CORONARY ANGIOGRAPHY N/A 10/10/2017   Procedure: LEFT HEART CATH AND CORONARY ANGIOGRAPHY;  Surgeon: Anner Alm ORN, MD;  Location: [facility] INVASIVE CV LAB;  Service: Cardiovascular;  Laterality: N/A; -patent RCA stents with mild in-stent restenosis. Medina 1,1,1 mLD-D2 90% (complicated by dissection/intramural thrombus)   LEFT HEART CATH AND CORONARY ANGIOGRAPHY N/A 07/03/2018   Procedure: LEFT HEART CATH AND CORONARY ANGIOGRAPHY;  Surgeon: Anner Alm ORN, MD;  Location: [facility] INVASIVE CV LAB;; 100% thrombotic occlusion distal LAD stent -- DES PCI. continued 100% D2 (previously lost). patent RCA stents, 65% OM2.  EF 30-35%.   LEFT HEART CATHETERIZATION WITH CORONARY ANGIOGRAM N/A 02/25/2013   Procedure: LEFT  HEART CATHETERIZATION WITH CORONARY ANGIOGRAM;  Surgeon: Erick JONELLE Bergamo, MD;  Location: [facility] CATH LAB;  Service: Cardiovascular;; CTO m RCA; otherwise normal coronaries   LEFT HEART CATHETERIZATION WITH CORONARY ANGIOGRAM N/A 04/01/2013   Procedure: LEFT HEART CATHETERIZATION WITH CORONARY ANGIOGRAM;  Surgeon: Erick JONELLE Bergamo, MD;  Location: [facility] CATH LAB;  Service: Cardiovascular: 100% occlusion of distal RCA stent (subacute stent thrombosis -  secondary to stopping Effient ).  Overlapping DES PCI   NM MYOVIEW  LTD  12/2015    EF 38%.  Hypertensive response to exercise (231/132 mmHg).  INTERMEDIATE RISK due to -diffuse hypokinesis and reduced EF.  No ischemia or infarction.   PERCUTANEOUS CORONARY STENT INTERVENTION (PCI-S)  04/01/2013   Procedure: PERCUTANEOUS CORONARY STENT INTERVENTION (PCI-S);  Surgeon: Erick JONELLE Bergamo, MD;  Location: [facility] CATH LAB;  Service: Cardiovascular;; PCI of distal RCA stent subacute thrombosis: Promus Premier DES 3.0 mm x 20 mm.   PERCUTANEOUS CORONARY STENT INTERVENTION (PCI-S)  02/25/2013   Procedure: PERCUTANEOUS CORONARY STENT INTERVENTION (PCI-S);  Surgeon: Erick JONELLE Bergamo, MD;  Location: [facility] CATH LAB;  RCA CTO PCI with overlapping Promus DES: 3.0 mm x 38 mm, 3.5 mm x 16mm   RIGHT/LEFT HEART CATH AND CORONARY ANGIOGRAPHY N/A 11/04/2019   Procedure: RIGHT/LEFT HEART CATH AND CORONARY ANGIOGRAPHY;  Surgeon: Anner Alm ORN, MD;  Location: [facility] INVASIVE CV LAB;; Mildly elevated LVEDP.  Ostial D1 100%.  Proximal to mid LAD stent 10% ISR.  Mid and distal stent widely patent.  30% distal distance.  Ostial OM 2 55%.  Proximal to mid RCA stent 50% stenosis.  Mid to distal RCA 40%.  Suspect that reduced EF is related to hypertensive emergen   TRANSTHORACIC ECHOCARDIOGRAM  07/04/2018   recurrent Anterior STEMI: EF 35-40%.  Apical hypokinesis.  GRII DD.  No thrombus noted.  (Improved EF from 30% up to 35-40% from previous echo)   TRANSTHORACIC ECHOCARDIOGRAM  11/2018; 01/2019   a)  TAKOTUSBO CM: EF 20-25%-diffuse HK..  Mod V thickness.  GRII DD w/ high LVEDP.  Severe LA dilation;; b) 02/14/19: Moderate LVH.  EF improved up to 45-50%.  GR 1 DD.  Severe apical akinesis.  Normal valves.   TRANSTHORACIC ECHOCARDIOGRAM  10/31/2019   Hypertensive emergency, hypoxic/hypercapnic respiratory failure;  EF 20 to 25%.  Global HK.  Mildly dilated LV.  Mildly elevated PA pressures.  Mild LA dilation.  Mild AR.  Mildly elevated RAP-8 mmHg.=> Eventual cath showed no change   TUBAL LIGATION  1994    OB History     Gravida  3   Para  3   Term  3   Preterm  0   AB  0   Living  3      SAB  0   IAB  0   Ectopic  0   Multiple  0   Live Births               Home Medications    Prior to Admission medications   Medication Sig Start Date End Date Taking? Authorizing Provider  guaiFENesin  (MUCINEX ) 600 MG 12 hr tablet Take 2 tablets (1,200 mg total) by mouth 2 (two) times daily. 04/18/24  Yes Adanely Reynoso, Asberry, PA-C  promethazine -dextromethorphan (PROMETHAZINE -DM) 6.25-15 MG/5ML syrup Take 5 mLs by mouth 4 (four) times daily as needed for cough. 04/18/24  Yes Toussaint Golson, Asberry, PA-C  carvedilol  (COREG ) 25 MG tablet Take 1 tablet (25 mg total) by mouth 2 (two) times daily. Patient not taking: Reported on 04/18/2024 01/30/24 07/28/24  Newlin, Enobong, MD  clopidogrel  (PLAVIX ) 75 MG tablet Take 1 tablet (75 mg total) by mouth daily. 12/27/23   Newlin, Enobong, MD  Continuous Glucose Sensor (DEXCOM G7 SENSOR) MISC Use to check blood sugar throughout the day. Change sensors once every 10 days. E11.65 04/04/24   Newlin, Enobong, MD  Dulaglutide  (TRULICITY ) 1.5 MG/0.5ML SOAJ Inject 1.5 mg into the skin once a week. Patient taking differently: Inject 1 mg into the skin once a week. 03/20/24   Newlin, Enobong, MD  famotidine  (PEPCID ) 20 MG tablet Take 1 tablet (20 mg total) by mouth at bedtime. 06/05/23   Newlin, Enobong, MD  insulin  glargine (LANTUS  SOLOSTAR) 100 UNIT/ML Solostar Pen Inject  27 Units into the skin daily. 03/20/24   Newlin, Enobong, MD  Insulin  Pen Needle (TECHLITE PEN NEEDLES) 32G X 4 MM MISC Use 1 pen needle with insulin  once daily 11/07/22   Newlin, Enobong, MD  Multiple Vitamins-Minerals (WOMENS 50+ MULTI VITAMIN PO) Take 1 tablet by mouth daily.    [provider]  nitroGLYCERIN  (NITROSTAT ) 0.4 MG SL tablet  Place 1 tablet (0.4 mg total) under the tongue every 5 (five) minutes as needed for chest pain. Reported on 11/25/2015 07/06/18   Jerrie Anger, PA  rosuvastatin  (CRESTOR ) 40 MG tablet Take 1 tablet (40 mg total) by mouth daily. 11/27/23   Newlin, Enobong, MD  sacubitril -valsartan  (ENTRESTO ) 97-103 MG Take 1 tablet by mouth 2 (two) times daily. 03/07/24   Anner Alm ORN, MD  spironolactone  (ALDACTONE ) 25 MG tablet Take 1 tablet (25 mg total) by mouth daily. 10/29/23   Anner Alm ORN, MD  TRUEPLUS LANCETS 28G MISC 28 g by Does not apply route 4 (four) times daily. 07/06/18   Rai, Ripudeep MARLA, MD  buPROPion  (WELLBUTRIN  SR) 150 MG 12 hr tablet Take 1 tablet (150 mg total) by mouth 2 (two) times daily. 11/17/19 07/20/20  Delbert Clam, MD    Family History Family History  Problem Relation Age of Onset   Diabetes Mother    Diabetes Sister    Diabetes Sister    Diabetes Son        type 1   Liver disease Neg Hx    Colon cancer Neg Hx    Esophageal cancer Neg Hx     Social History Social History   Tobacco Use   Smoking status: Every Day    Current packs/day: 0.25    Average packs/day: 0.3 packs/day for 22.0 years (5.5 ttl pk-yrs)    Types: Cigarettes   Smokeless tobacco: Never   Tobacco comments:    current smoker 4-5 cigarettes per day; previously smoked at least a pack a day.        6 AM cigarette-06-15-23  Vaping Use   Vaping status: Never Used  Substance Use Topics   Alcohol use: Not Currently    Comment: 07/04/2018 only on special occasions; maybe once/yr   Drug use: Not Currently    Types: Marijuana    Comment: 02/25/2013 quit  marijuana ~ 2001     Allergies   Farxiga  [dapagliflozin ]   Review of Systems Review of Systems  Respiratory:  Positive for cough.    As per HPI  Physical Exam Triage Vital Signs ED Triage Vitals [04/18/24 1302]  Encounter Vitals Group     BP (!) 155/86     Girls Systolic BP Percentile      Girls Diastolic BP Percentile      Boys Systolic BP Percentile      Boys Diastolic BP Percentile      Pulse Rate 81     Resp 18     Temp 98.3 F (36.8 C)     Temp Source Oral     SpO2 98 %     Weight      Height      Head Circumference      Peak Flow      Pain Score 8     Pain Loc      Pain Education      Exclude from Growth Chart    No data found.  Updated Vital Signs BP (!) 155/86 (BP Location: Right Arm)   Pulse 81   Temp 98.3 F (36.8 C) (Oral)   Resp 18   LMP 10/20/2015   SpO2 98%    Physical Exam Vitals and nursing note reviewed.  Constitutional:      General: She is not in acute distress.    Appearance: She is not ill-appearing or diaphoretic.  HENT:     Right Ear: Tympanic membrane and ear canal normal.  Left Ear: Tympanic membrane and ear canal normal.     Nose: Congestion and rhinorrhea present.     Mouth/Throat:     Mouth: Mucous membranes are moist.     Pharynx: Oropharynx is clear. No posterior oropharyngeal erythema.  Eyes:     Conjunctiva/sclera: Conjunctivae normal.  Cardiovascular:     Rate and Rhythm: Normal rate and regular rhythm.     Heart sounds: Normal heart sounds.  Pulmonary:     Effort: Pulmonary effort is normal. No tachypnea or bradypnea.     Breath sounds: Decreased air movement present.     Comments: Decreased lung sounds in bilateral bases. No crackles, rales Abdominal:     Palpations: Abdomen is soft.     Tenderness: There is no abdominal tenderness.  Musculoskeletal:     Cervical back: Normal range of motion. No rigidity.     Right lower leg: No edema.     Left lower leg: No edema.  Lymphadenopathy:     Cervical: No  cervical adenopathy.  Neurological:     Mental Status: She is alert and oriented to person, place, and time.    UC Treatments / Results  Labs (all labs ordered are listed, but only abnormal results are displayed) Labs Reviewed  POC COVID19/FLU A&B COMBO    EKG   Radiology DG Chest 2 View Result Date: 04/18/2024 EXAM: 2 VIEW(S) XRAY OF THE CHEST 04/18/2024 01:59:01 PM COMPARISON: 09/20/2023 CLINICAL HISTORY: Cough, shob, wheezing, heart failure hx. Pt c/o runny nose x1wk. C/o cough and chest congestion since yesterday. Taken multiple OTC meds with temporary relief. FINDINGS: LUNGS AND PLEURA: No focal pulmonary opacity. No pulmonary edema. No pleural effusion. No pneumothorax. HEART AND MEDIASTINUM: Coronary artery stents noted. No acute abnormality of the cardiac and mediastinal silhouettes. BONES AND SOFT TISSUES: Multilevel degenerative changes of thoracic spine. No acute osseous abnormality. IMPRESSION: 1. No acute cardiopulmonary process detected. Electronically signed by: Ryan Chess MD 04/18/2024 02:22 PM EDT RP Workstation: [id number]    Procedures Procedures (including critical care time)  Medications Ordered in UC Medications - No data to display  Initial Impression / Assessment and Plan / UC Course  I have reviewed the triage vital signs and the nursing notes.  Pertinent labs & imaging results that were available during my care of the patient were reviewed by me and considered in my medical decision making (see chart for details).  Rapid flu/covid negative   She is afebrile. Sating 98% room air.  Decreased lung sounds, and cough causing pain in the chest. Chest xray negative. Images independently reviewed by me, agree with radiology interpretation.  Advised treatments with her home inhalers (Entresto  twice daily) Promethazine  DM QID prn, drowsy precautions Guaifenesin  with increased fluids Avoid NSAIDs and steroids due to kidney disease and uncontrolled  DM  Advised monitor for improvement over next several days  Return and ED precautions discussed Agrees to plan, no questions at this time   Final Clinical Impressions(s) / UC Diagnoses   Final diagnoses:  Acute cough     Discharge Instructions      Xray does not show any infection or fluid in your lungs!  I would like you to take guaifenacin extra strength twice daily. This will help with congestion and thin up mucous. Please take with LOTS of water - otherwise it won't work.   The promethazine  DM cough syrup can be used up to 4 times daily. If this medication makes you drowsy, take only once before bed.  Use  your Entresto  inhaler twice daily as directed.  Continue plain tylenol  for pain and any fever  See how you are feeling in the next 4-5 days. If no improvement, please return. If worsening symptoms, go directly to the emergency department.     ED Prescriptions     Medication Sig Dispense Auth. Provider   guaiFENesin  (MUCINEX ) 600 MG 12 hr tablet Take 2 tablets (1,200 mg total) by mouth 2 (two) times daily. 30 tablet Dontay Harm, PA-C   promethazine -dextromethorphan (PROMETHAZINE -DM) 6.25-15 MG/5ML syrup Take 5 mLs by mouth 4 (four) times daily as needed for cough. 240 mL Lulu Hirschmann, Asberry, PA-C      PDMP not reviewed this encounter.   Jeryl Asberry, PA-C 04/18/24 1443

## 2024-04-18 NOTE — Telephone Encounter (Signed)
 Copied from CRM #[id number]. Topic: Clinical - Medication Prior Auth >> Apr 18, 2024  4:35 PM Kevelyn M wrote:  Reason for CRM: Patient needs prior auth for Dulaglutide  (TRULICITY ) 1.5 MG/0.5ML SOAJ.  Call back # [phone] Fax# [phone]

## 2024-04-18 NOTE — Telephone Encounter (Signed)
 FYI

## 2024-04-21 ENCOUNTER — Other Ambulatory Visit: Payer: Self-pay

## 2024-04-21 NOTE — Telephone Encounter (Signed)
 Patient states that she has informed [employer] that she has canceled [employer].

## 2024-04-22 ENCOUNTER — Other Ambulatory Visit: Payer: Self-pay

## 2024-04-24 ENCOUNTER — Other Ambulatory Visit: Payer: Self-pay

## 2024-04-25 ENCOUNTER — Other Ambulatory Visit: Payer: Self-pay

## 2024-04-28 ENCOUNTER — Other Ambulatory Visit: Payer: Self-pay

## 2024-04-30 ENCOUNTER — Other Ambulatory Visit: Payer: Self-pay

## 2024-04-30 ENCOUNTER — Telehealth: Payer: Self-pay | Admitting: Family Medicine

## 2024-04-30 NOTE — Telephone Encounter (Signed)
 pt confirmed appt (per vr

## 2024-05-01 ENCOUNTER — Other Ambulatory Visit: Payer: Self-pay

## 2024-05-01 ENCOUNTER — Encounter: Payer: Self-pay | Admitting: Family Medicine

## 2024-05-01 ENCOUNTER — Ambulatory Visit: Attending: Family Medicine | Admitting: Family Medicine

## 2024-05-01 VITALS — BP 131/84 | HR 63 | Ht 69.0 in | Wt 221.8 lb

## 2024-05-01 DIAGNOSIS — E1159 Type 2 diabetes mellitus with other circulatory complications: Secondary | ICD-10-CM

## 2024-05-01 DIAGNOSIS — I110 Hypertensive heart disease with heart failure: Secondary | ICD-10-CM

## 2024-05-01 DIAGNOSIS — Z794 Long term (current) use of insulin: Secondary | ICD-10-CM

## 2024-05-01 DIAGNOSIS — I25110 Atherosclerotic heart disease of native coronary artery with unstable angina pectoris: Secondary | ICD-10-CM

## 2024-05-01 DIAGNOSIS — E1169 Type 2 diabetes mellitus with other specified complication: Secondary | ICD-10-CM

## 2024-05-01 DIAGNOSIS — I5042 Chronic combined systolic (congestive) and diastolic (congestive) heart failure: Secondary | ICD-10-CM

## 2024-05-01 DIAGNOSIS — N289 Disorder of kidney and ureter, unspecified: Secondary | ICD-10-CM

## 2024-05-01 DIAGNOSIS — J069 Acute upper respiratory infection, unspecified: Secondary | ICD-10-CM

## 2024-05-01 DIAGNOSIS — K219 Gastro-esophageal reflux disease without esophagitis: Secondary | ICD-10-CM

## 2024-05-01 DIAGNOSIS — I152 Hypertension secondary to endocrine disorders: Secondary | ICD-10-CM

## 2024-05-01 LAB — POCT GLYCOSYLATED HEMOGLOBIN (HGB A1C): HbA1c, POC (controlled diabetic range): 7.6 % — AB (ref 0.0–7.0)

## 2024-05-01 MED ORDER — CARVEDILOL 25 MG PO TABS
25.0000 mg | ORAL_TABLET | Freq: Two times a day (BID) | ORAL | 1 refills | Status: AC
  Filled 2024-05-01 – 2024-07-22 (×2): qty 180, 90d supply, fill #0

## 2024-05-01 MED ORDER — PROMETHAZINE-DM 6.25-15 MG/5ML PO SYRP
5.0000 mL | ORAL_SOLUTION | Freq: Four times a day (QID) | ORAL | 0 refills | Status: DC | PRN
  Filled 2024-05-01: qty 240, 12d supply, fill #0

## 2024-05-01 MED ORDER — FAMOTIDINE 20 MG PO TABS
20.0000 mg | ORAL_TABLET | Freq: Every day | ORAL | 1 refills | Status: AC
  Filled 2024-05-01 – 2024-06-06 (×2): qty 90, 90d supply, fill #0
  Filled 2024-09-04: qty 90, 90d supply, fill #1

## 2024-05-01 MED ORDER — LANTUS SOLOSTAR 100 UNIT/ML ~~LOC~~ SOPN
30.0000 [IU] | PEN_INJECTOR | Freq: Every day | SUBCUTANEOUS | 6 refills | Status: AC
  Filled 2024-05-01: qty 27, 90d supply, fill #0

## 2024-05-01 MED ORDER — CLOPIDOGREL BISULFATE 75 MG PO TABS
75.0000 mg | ORAL_TABLET | Freq: Every day | ORAL | 1 refills | Status: AC
  Filled 2024-05-01 – 2024-06-06 (×2): qty 90, 90d supply, fill #0
  Filled 2024-09-04: qty 90, 90d supply, fill #1

## 2024-05-01 MED ORDER — PREDNISONE 20 MG PO TABS
20.0000 mg | ORAL_TABLET | Freq: Every day | ORAL | 0 refills | Status: AC
  Filled 2024-05-01: qty 5, 5d supply, fill #0

## 2024-05-01 NOTE — Progress Notes (Signed)
 Subjective:  Patient ID: Amy Chaney, female    DOB: 04-25-1971  Age: 53 y.o. MRN: [id number]  CC: Medical Management of Chronic Issues (Cough and congestion for 1 month)     Discussed the use of AI scribe software for clinical note transcription with the patient, who gave verbal consent to proceed.  History of Present Illness Amy Chaney is a 53 year old female with  a history of Type 2 diabetes mellitus, hypertension, CHF (EF 50 to 55% from echo of 02/2021), CAD (status post PCI to LAD with 3 overlapping DES in 09/2017), STEMI in 06/2018 who presents with a persistent cough and congestion.  She has experienced a persistent cough and congestion for the past month. The cough alternates between dry and productive, with no successful expectoration. There is postnasal drip, and nasal blowing often produces significant mucus. Occasional wheezing is present. Promethazine  DM provides some relief, but the cough persists.  She had an urgent care for same, A chest x-ray showed no acute findings, and tests for flu and COVID were negative Promethazine  DM prescribed.  She is on Trulicity  1.5 mg and Lantus  27 units for diabetes, with fasting blood sugars ranging from 120 to 160 mg/dL and an improved hemoglobin A1c from 9.1% to 7.6%.  She has heart disease but denies current chest pain or shortness of breath, except when coughing. Her last echocardiogram in 2022 showed an ejection fraction of 50-55%.    Past Medical History:  Diagnosis Date   Coronary artery disease involving native coronary artery of native heart with angina pectoris 01/2013   a. s/p PCI of the mid RCA 01/2013 //  b. LHC 9/14: EF 55%, RCA stent 100% occluded, LAD irregularities >>  subacute stent thrombosis, Promus DES PCI distal overlap // c) 09/2017 - Ant STEMI - LAD-D2 PCI - Successful PCI of LAD (3 overlapping DES), unable to restore flow down D2l that was stented as well.  Likely related to downstream dissection, but unable to  rewire.   Daily headache    Depression    Dyslipidemia, goal LDL below 70    History of Doppler ultrasound    carotid bruit >> a. 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  EF down to 20-25% global hypokinesis.  Was in the setting of grieving over the loss of her son from MI.  ->  EF improved to 45-50% on follow-up echocardiogram.   Hypertension    Ischemic cardiomyopathy 06/2018   s/p inferior STEMI followed by 2 anterior STEMIs: Following recovery from Takotsubo Cardiomyopathy- Echo 02/14/19: Moderate LVH.  EF improved up to 45-50%.  GR 1 DD.  Severe apical akinesis.  Normal valves.   Kidney disease    Mild dysplasia of cervix (CIN I) 02/04/2019   Seen after colposcopy on 01/23/19 done for ASCU +HPV pap  > repeat pap and HPV test in 12 and 24 months   STEMI involving left anterior descending coronary artery (HCC) 09/2017; 06/2018   a) Severe Medina 1, 1, 1 LAD-D2 lesion (complicated by post PTCA dissection/intramural hematoma) -successful extensive PCI of the LAD but unable to maintain patency of the stented D2. b) distal mLAD stent Edge 100% thrombosis --> PTCA & Overlapping DES PCI (Synergy 3 x 12 --> 3.6-3.3 mm). D2 occluded. RCA stents patent, 65 % OM2. EF 30-35%.   STEMI involving oth coronary artery of inferior wall (HCC) 03/2013  Secondary to subacute stent thrombosis of RCA stent segment having missed 4 doses of Effient .   Tobacco abuse 01/11/2016   Type II diabetes mellitus (HCC) 12/2010   sister and son also diabetic     Past Surgical History:  Procedure Laterality Date   CHOLECYSTECTOMY  ~ 2000   CORONARY STENT INTERVENTION N/A 10/10/2017   Procedure: CORONARY STENT INTERVENTION;  Surgeon: Anner Alm ORN, MD;  Location: [facility] INVASIVE CV LAB;  Service: Cardiovascular; LAD PCI: STENT SYNERGY DES 3.5X32 (p),STENT SYNERGY DES 3.5X 8 (m), STENT SYNERGY DES 3.5X28 (d).   D2 PCI: STENT SYNERGY DES 2.5X24.  (UNABL TO REWIRE & EXPAND - TO OF DIAG AT END OF CASE)   CORONARY STENT INTERVENTION N/A 07/03/2018   Procedure: CORONARY STENT INTERVENTION;  Surgeon: Anner Alm ORN, MD;  Location: [facility] INVASIVE CV LAB;; PTCA & overlapping DES PCI (overlaps prior stents distally) - Synergy DES 3 x 12 (3.6 mm @ overlap -- 3.3 mm distally)>   LEFT HEART CATH AND CORONARY ANGIOGRAPHY N/A 10/10/2017   Procedure: LEFT HEART CATH AND CORONARY ANGIOGRAPHY;  Surgeon: Anner Alm ORN, MD;  Location: [facility] INVASIVE CV LAB;  Service: Cardiovascular;  Laterality: N/A; -patent RCA stents with mild in-stent restenosis. Medina 1,1,1 mLD-D2 90% (complicated by dissection/intramural thrombus)   LEFT HEART CATH AND CORONARY ANGIOGRAPHY N/A 07/03/2018   Procedure: LEFT HEART CATH AND CORONARY ANGIOGRAPHY;  Surgeon: Anner Alm ORN, MD;  Location: [facility] INVASIVE CV LAB;; 100% thrombotic occlusion distal LAD stent -- DES PCI. continued 100% D2 (previously lost). patent RCA stents, 65% OM2.  EF 30-35%.   LEFT HEART CATHETERIZATION WITH CORONARY ANGIOGRAM N/A 02/25/2013   Procedure: LEFT HEART CATHETERIZATION WITH CORONARY ANGIOGRAM;  Surgeon: Erick JONELLE Bergamo, MD;  Location: [facility] CATH LAB;  Service: Cardiovascular;; CTO m RCA; otherwise normal coronaries   LEFT HEART CATHETERIZATION WITH CORONARY ANGIOGRAM N/A 04/01/2013   Procedure: LEFT HEART CATHETERIZATION WITH CORONARY ANGIOGRAM;  Surgeon: Erick JONELLE Bergamo, MD;  Location: [facility] CATH LAB;  Service: Cardiovascular: 100% occlusion of distal RCA stent (subacute stent thrombosis -  secondary to stopping Effient ).  Overlapping DES PCI   NM MYOVIEW  LTD  12/2015    EF 38%.  Hypertensive response to exercise (231/132 mmHg).  INTERMEDIATE RISK due to -diffuse hypokinesis and reduced EF.  No ischemia or infarction.   PERCUTANEOUS CORONARY STENT INTERVENTION (PCI-S)  04/01/2013   Procedure: PERCUTANEOUS CORONARY STENT INTERVENTION (PCI-S);  Surgeon: Erick JONELLE Bergamo, MD;   Location: [facility] CATH LAB;  Service: Cardiovascular;; PCI of distal RCA stent subacute thrombosis: Promus Premier DES 3.0 mm x 20 mm.   PERCUTANEOUS CORONARY STENT INTERVENTION (PCI-S)  02/25/2013   Procedure: PERCUTANEOUS CORONARY STENT INTERVENTION (PCI-S);  Surgeon: Erick JONELLE Bergamo, MD;  Location: [facility] CATH LAB;  RCA CTO PCI with overlapping Promus DES: 3.0 mm x 38 mm, 3.5 mm x 16mm   RIGHT/LEFT HEART CATH AND CORONARY ANGIOGRAPHY N/A 11/04/2019   Procedure: RIGHT/LEFT HEART CATH AND CORONARY ANGIOGRAPHY;  Surgeon: Anner Alm ORN, MD;  Location: [facility] INVASIVE CV LAB;; Mildly elevated LVEDP.  Ostial D1 100%.  Proximal to mid LAD stent 10% ISR.  Mid and distal stent widely patent.  30% distal distance.  Ostial OM 2 55%.  Proximal to mid RCA stent 50% stenosis.  Mid to distal RCA 40%.  Suspect that reduced EF is related to hypertensive emergen   TRANSTHORACIC ECHOCARDIOGRAM  07/04/2018   recurrent Anterior STEMI: EF 35-40%.  Apical hypokinesis.  GRII DD.  No thrombus noted.  (Improved  EF from 30% up to 35-40% from previous echo)   TRANSTHORACIC ECHOCARDIOGRAM  11/2018; 01/2019   a) TAKOTUSBO CM: EF 20-25%-diffuse HK..  Mod V thickness.  GRII DD w/ high LVEDP.  Severe LA dilation;; b) 02/14/19: Moderate LVH.  EF improved up to 45-50%.  GR 1 DD.  Severe apical akinesis.  Normal valves.   TRANSTHORACIC ECHOCARDIOGRAM  10/31/2019   Hypertensive emergency, hypoxic/hypercapnic respiratory failure;  EF 20 to 25%.  Global HK.  Mildly dilated LV.  Mildly elevated PA pressures.  Mild LA dilation.  Mild AR.  Mildly elevated RAP-8 mmHg.=> Eventual cath showed no change   TUBAL LIGATION  1994    Family History  Problem Relation Age of Onset   Diabetes Mother    Diabetes Sister    Diabetes Sister    Diabetes Son        type 1   Liver disease Neg Hx    Colon cancer Neg Hx    Esophageal cancer Neg Hx     Social History   Socioeconomic History   Marital status: Single    Spouse name: Not on file   Number of  children: 3   Years of education: Not on file   Highest education level: 11th grade  Occupational History    Employer: [employer]   Occupation: pca  Tobacco Use   Smoking status: Every Day    Current packs/day: 0.25    Average packs/day: 0.3 packs/day for 22.0 years (5.5 ttl pk-yrs)    Types: Cigarettes   Smokeless tobacco: Never   Tobacco comments:    current smoker 4-5 cigarettes per day; previously smoked at least a pack a day.        6 AM cigarette-06-15-23  Vaping Use   Vaping status: Never Used  Substance and Sexual Activity   Alcohol use: Not Currently    Comment: 07/04/2018 only on special occasions; maybe once/yr   Drug use: Not Currently    Types: Marijuana    Comment: 02/25/2013 quit marijuana ~ 2001   Sexual activity: Not Currently    Birth control/protection: Surgical, Post-menopausal  Other Topics Concern   Not on file  Social History Narrative   Diet: 50/50 take out and home cook meals (eats burgers and lots of fried foods), limits portions.       Exercise: walks 15-20 minutes twice per week   Social Drivers of Health   Financial Resource Strain: High Risk (04/03/2023)   Overall Financial Resource Strain (CARDIA)    Difficulty of Paying Living Expenses: Very hard  Food Insecurity: Food Insecurity Present (12/12/2022)   Hunger Vital Sign    Worried About Running Out of Food in the Last Year: Sometimes true    Ran Out of Food in the Last Year: Sometimes true  Transportation Needs: No Transportation Needs (12/12/2022)   PRAPARE - Administrator, Civil Service (Medical): No    Lack of Transportation (Non-Medical): No  Physical Activity: Inactive (12/12/2022)   Exercise Vital Sign    Days of Exercise per Week: 0 days    Minutes of Exercise per Session: 0 min  Stress: No Stress Concern Present (12/12/2022)   [employer] of Occupational Health - Occupational Stress Questionnaire    Feeling of Stress : Only a little  Social Connections:  Moderately Integrated (12/12/2022)   Social Connection and Isolation Panel    Frequency of Communication with Friends and Family: Three times a week    Frequency of Social Gatherings with  Friends and Family: Three times a week    Attends Religious Services: 1 to 4 times per year    Active Member of Clubs or Organizations: Yes    Attends Banker Meetings: 1 to 4 times per year    Marital Status: Divorced    Allergies  Allergen Reactions   Farxiga  [Dapagliflozin ] Other (See Comments)    Caused UTI and polyps on kidneys     Outpatient Medications Prior to Visit  Medication Sig Dispense Refill   Continuous Glucose Sensor (DEXCOM G7 SENSOR) MISC Use to check blood sugar throughout the day. Change sensors once every 10 days. E11.65 3 each 5   guaiFENesin  (MUCINEX ) 600 MG 12 hr tablet Take 2 tablets (1,200 mg total) by mouth 2 (two) times daily. 30 tablet 0   Insulin  Pen Needle (TECHLITE PEN NEEDLES) 32G X 4 MM MISC Use 1 pen needle with insulin  once daily 100 each 2   Multiple Vitamins-Minerals (WOMENS 50+ MULTI VITAMIN PO) Take 1 tablet by mouth daily.     nitroGLYCERIN  (NITROSTAT ) 0.4 MG SL tablet Place 1 tablet (0.4 mg total) under the tongue every 5 (five) minutes as needed for chest pain. Reported on 11/25/2015 25 tablet 3   rosuvastatin  (CRESTOR ) 40 MG tablet Take 1 tablet (40 mg total) by mouth daily. 90 tablet 2   sacubitril -valsartan  (ENTRESTO ) 97-103 MG Take 1 tablet by mouth 2 (two) times daily. 180 tablet 2   spironolactone  (ALDACTONE ) 25 MG tablet Take 1 tablet (25 mg total) by mouth daily. 90 tablet 3   TRUEPLUS LANCETS 28G MISC 28 g by Does not apply route 4 (four) times daily. 120 each 2   carvedilol  (COREG ) 25 MG tablet Take 1 tablet (25 mg total) by mouth 2 (two) times daily. 180 tablet 1   clopidogrel  (PLAVIX ) 75 MG tablet Take 1 tablet (75 mg total) by mouth daily. 90 tablet 1   famotidine  (PEPCID ) 20 MG tablet Take 1 tablet (20 mg total) by mouth at bedtime. 90  tablet 1   insulin  glargine (LANTUS  SOLOSTAR) 100 UNIT/ML Solostar Pen Inject 27 Units into the skin daily.     promethazine -dextromethorphan (PROMETHAZINE -DM) 6.25-15 MG/5ML syrup Take 5 mLs by mouth 4 (four) times daily as needed for cough. 240 mL 0   Dulaglutide  (TRULICITY ) 1.5 MG/0.5ML SOAJ Inject 1.5 mg into the skin once a week. (Patient not taking: Reported on 05/01/2024) 2 mL 6   No facility-administered medications prior to visit.     ROS Review of Systems  Constitutional:  Negative for activity change and appetite change.  HENT:  Positive for congestion. Negative for sinus pressure and sore throat.   Respiratory:  Positive for cough. Negative for chest tightness, shortness of breath and wheezing.   Cardiovascular:  Negative for chest pain and palpitations.  Gastrointestinal:  Negative for abdominal distention, abdominal pain and constipation.  Genitourinary: Negative.   Musculoskeletal: Negative.   Psychiatric/Behavioral:  Negative for behavioral problems and dysphoric mood.     Objective:  BP 131/84   Pulse 63   Ht 5' 9 (1.753 m)   Wt 221 lb 12.8 oz (100.6 kg)   LMP 10/20/2015   SpO2 100%   BMI 32.75 kg/m      05/01/2024    9:41 AM 04/18/2024    1:02 PM 01/30/2024    3:04 PM  BP/Weight  Systolic BP 131 155 128  Diastolic BP 84 86 81  Wt. (Lbs) 221.8    BMI 32.75 kg/m2  Wt Readings from Last 3 Encounters:  05/01/24 221 lb 12.8 oz (100.6 kg)  01/30/24 221 lb 3.2 oz (100.3 kg)  10/31/23 221 lb 6.4 oz (100.4 kg)     Physical Exam Constitutional:      Appearance: She is well-developed.  HENT:     Right Ear: Tympanic membrane normal.     Left Ear: Tympanic membrane normal.     Mouth/Throat:     Comments: Postnasal drip in oropharynx Cardiovascular:     Rate and Rhythm: Normal rate.     Heart sounds: Normal heart sounds. No murmur heard. Pulmonary:     Effort: Pulmonary effort is normal.     Breath sounds: Normal breath sounds. No wheezing or rales.   Chest:     Chest wall: No tenderness.  Abdominal:     General: Bowel sounds are normal. There is no distension.     Palpations: Abdomen is soft. There is no mass.     Tenderness: There is no abdominal tenderness.  Musculoskeletal:        General: Normal range of motion.     Right lower leg: No edema.     Left lower leg: No edema.  Neurological:     Mental Status: She is alert and oriented to person, place, and time.  Psychiatric:        Mood and Affect: Mood normal.        Latest Ref Rng & Units 02/05/2024    8:44 AM 04/13/2023    8:59 AM 07/06/2022    3:56 PM  CMP  Glucose 70 - 99 mg/dL 809  841  745   BUN 6 - 24 mg/dL 26  20  9    Creatinine 0.57 - 1.00 mg/dL 8.17  8.86  8.78   Sodium 134 - 144 mmol/L 139  139  134   Potassium 3.5 - 5.2 mmol/L 4.4  4.0  3.7   Chloride 96 - 106 mmol/L 104  102  100   CO2 20 - 29 mmol/L 20  22  23    Calcium  8.7 - 10.2 mg/dL 9.4  9.2  9.0   Total Protein 6.0 - 8.5 g/dL 7.6  7.1  7.5   Total Bilirubin 0.0 - 1.2 mg/dL 0.3  0.4  0.7   Alkaline Phos 44 - 121 IU/L 52  45  39   AST 0 - 40 IU/L 12  15  20    ALT 0 - 32 IU/L 10  10  15      Lipid Panel     Component Value Date/Time   CHOL 105 02/05/2024 0844   TRIG 77 02/05/2024 0844   HDL 44 02/05/2024 0844   CHOLHDL 2.6 04/13/2023 0859   CHOLHDL 4.2 07/05/2018 0330   VLDL 15 07/05/2018 0330   LDLCALC 45 02/05/2024 0844    CBC    Component Value Date/Time   WBC 11.2 (H) 04/13/2023 0859   WBC 3.5 (L) 07/06/2022 1556   RBC 4.40 04/13/2023 0859   RBC 4.37 07/06/2022 1556   HGB 11.9 04/13/2023 0859   HCT 37.2 04/13/2023 0859   PLT 361 04/13/2023 0859   MCV 85 04/13/2023 0859   MCH 27.0 04/13/2023 0859   MCH 27.0 07/06/2022 1556   MCHC 32.0 04/13/2023 0859   MCHC 33.3 07/06/2022 1556   RDW 13.4 04/13/2023 0859   LYMPHSABS 0.9 07/06/2022 1556   LYMPHSABS 3.1 03/14/2022 1653   MONOABS 0.6 07/06/2022 1556   EOSABS 0.2 07/06/2022 1556   EOSABS 0.4  03/14/2022 1653   BASOSABS 0.1  07/06/2022 1556   BASOSABS 0.1 03/14/2022 1653    Lab Results  Component Value Date   HGBA1C 7.6 (A) 05/01/2024    Lab Results  Component Value Date   HGBA1C 7.6 (A) 05/01/2024   HGBA1C 9.1 (A) 01/30/2024   HGBA1C 7.2 (H) 10/31/2023        Assessment & Plan Upper respiratory infection with postnasal drip Persistent cough and congestion for one month, with postnasal drip and occasional wheezing. Chest x-ray negative for acute findings. No evidence of pneumonia. Likely exacerbated by environmental factors such as cold air exposure. - Prescribed prednisone  to reduce drainage and inflammation, considering potential impact on blood glucose levels due to diabetes. - Continue current cough medication (promethazine  DM).  Type 2 diabetes mellitus with other specified complication with use of insulin / Long-term insulin  use Diabetes is better controlled with current regimen. Hemoglobin A1c improved from 9.1% to 7.6%. Prednisone  may increase blood glucose levels, necessitating adjustment of Lantus  dosage. - Increase Lantus  to 30 units daily. - Monitor blood glucose levels closely, especially with the addition of prednisone . - Instructed to reduce Lantus  by 2 units if blood sugars drop significantly.  Abnormal kidney function Slightly abnormal kidney function noted in July with creatinine level of 1.82, up from 1.13. - Order blood work to recheck kidney function.  Hypertension associated with type 2 diabetes Blood pressure is 131/84 mmHg. Goal is to maintain blood pressure less than 130/80 mmHg due to heart disease history. - Encourage weight loss and regular exercise to help manage blood pressure.  Gastroesophageal reflux disease Continues to take famotidine  for acid reflux with no reported issues. - Continue famotidine  as prescribed.  CAD status post multiple stents/ Hypertensive heart disease with combined chronic systolic and diastolic heart failure -Euvolemic with EF of 50 to  55% -Continue guideline directed medical therapy -currently on beta-blocker, Entresto , -Spironolactone , not on SGLT2i (due to allergy) -Secondary risk factor modification -Continue Plavix , statin -Follow-up with cardiology   Meds ordered this encounter  Medications   carvedilol  (COREG ) 25 MG tablet    Sig: Take 1 tablet (25 mg total) by mouth 2 (two) times daily.    Dispense:  180 tablet    Refill:  1   clopidogrel  (PLAVIX ) 75 MG tablet    Sig: Take 1 tablet (75 mg total) by mouth daily.    Dispense:  90 tablet    Refill:  1   famotidine  (PEPCID ) 20 MG tablet    Sig: Take 1 tablet (20 mg total) by mouth at bedtime.    Dispense:  90 tablet    Refill:  1   promethazine -dextromethorphan (PROMETHAZINE -DM) 6.25-15 MG/5ML syrup    Sig: Take 5 mLs by mouth 4 (four) times daily as needed for cough.    Dispense:  240 mL    Refill:  0   insulin  glargine (LANTUS  SOLOSTAR) 100 UNIT/ML Solostar Pen    Sig: Inject 30 Units into the skin daily.    Dispense:  30 mL    Refill:  6    Dose increase   predniSONE  (DELTASONE ) 20 MG tablet    Sig: Take 1 tablet (20 mg total) by mouth daily with breakfast.    Dispense:  5 tablet    Refill:  0    Follow-up: Return in about 3 months (around 08/01/2024) for Chronic medical conditions.       Corrina Sabin, MD, FAAFP. [facility] [place], [place] [phone]  05/01/2024, 1:35 PM

## 2024-05-02 ENCOUNTER — Ambulatory Visit: Payer: Self-pay | Admitting: Family Medicine

## 2024-05-02 LAB — CMP14+EGFR
ALT: 8 IU/L (ref 0–32)
AST: 14 IU/L (ref 0–40)
Albumin: 3.9 g/dL (ref 3.8–4.9)
Alkaline Phosphatase: 47 IU/L — ABNORMAL LOW (ref 49–135)
BUN/Creatinine Ratio: 11 (ref 9–23)
BUN: 19 mg/dL (ref 6–24)
Bilirubin Total: 0.4 mg/dL (ref 0.0–1.2)
CO2: 19 mmol/L — ABNORMAL LOW (ref 20–29)
Calcium: 8.8 mg/dL (ref 8.7–10.2)
Chloride: 104 mmol/L (ref 96–106)
Creatinine, Ser: 1.66 mg/dL — ABNORMAL HIGH (ref 0.57–1.00)
Globulin, Total: 3.3 g/dL (ref 1.5–4.5)
Glucose: 197 mg/dL — ABNORMAL HIGH (ref 70–99)
Potassium: 4.1 mmol/L (ref 3.5–5.2)
Sodium: 141 mmol/L (ref 134–144)
Total Protein: 7.2 g/dL (ref 6.0–8.5)
eGFR: 36 mL/min/1.73 — ABNORMAL LOW (ref >59)

## 2024-05-07 ENCOUNTER — Other Ambulatory Visit: Payer: Self-pay

## 2024-05-08 ENCOUNTER — Other Ambulatory Visit: Payer: Self-pay

## 2024-05-08 NOTE — Telephone Encounter (Signed)
 Noted

## 2024-05-09 ENCOUNTER — Other Ambulatory Visit: Payer: Self-pay

## 2024-05-23 ENCOUNTER — Other Ambulatory Visit: Payer: Self-pay

## 2024-05-27 ENCOUNTER — Other Ambulatory Visit (HOSPITAL_COMMUNITY): Payer: Self-pay

## 2024-05-27 ENCOUNTER — Other Ambulatory Visit: Payer: Self-pay

## 2024-05-27 ENCOUNTER — Telehealth: Payer: Self-pay | Admitting: Pharmacy Technician

## 2024-05-27 NOTE — Telephone Encounter (Signed)
 Pharmacy Patient Advocate Encounter  Received notification from [facility] MEDICAID that Prior Authorization for Dexcom G7 Senso  has been APPROVED from 05/27/24 to 05/27/25. Ran test claim, Copay is $0.00. This test claim was processed through [facility]- copay amounts may vary at other pharmacies due to pharmacy/plan contracts, or as the patient moves through the different stages of their insurance plan.   PA #/Case ID/Reference #: [id number]

## 2024-05-27 NOTE — Telephone Encounter (Signed)
 Pharmacy Patient Advocate Encounter   Received notification from CoverMyMeds that prior authorization for Dexcom G7 Sensor  is required/requested.   Insurance verification completed.   The patient is insured through [facility] MEDICAID.   Per test claim: PA required; PA submitted to above mentioned insurance via Latent Key/confirmation #/EOC [id number] Status is pending

## 2024-05-28 ENCOUNTER — Other Ambulatory Visit: Payer: Self-pay

## 2024-06-06 ENCOUNTER — Other Ambulatory Visit: Payer: Self-pay

## 2024-06-27 ENCOUNTER — Other Ambulatory Visit: Payer: Self-pay

## 2024-06-30 ENCOUNTER — Telehealth: Payer: Self-pay | Admitting: Family Medicine

## 2024-06-30 NOTE — Telephone Encounter (Signed)
 Copied from CRM #[id number]. Topic: General - Other >> Jun 30, 2024  3:57 PM Charlet HERO wrote:  Reason for CRM: Patient is calling to speak with case worker about helping with her rent she is going to need help and would like to have a call in the morning.

## 2024-07-02 NOTE — Telephone Encounter (Signed)
 I spoke to the patient and she explained that she moved into a new apartment.  The landlord was not making repairs to her appliances as she had requested, so she held back her rent payments and now owes $2100+ and she is not able to afford that.  She was granted assistance from [facility] last year and was inquiring if any more assistance is available.  I explained to her that it is a one time grant and she said she understood.  I told her that she can contact her health insurance company because some of the managed Medicaid plans offer extra benefits which include a small benefit for rental assistance.  I also asked if she has contacted [facility] and she said no.  She stated that those programs offer only small amounts of money and that would not help her.  She stated she has $600 from family for the rent but that is not enough. I asked her if she has spoken to her landlord about a payment plan and she said she has not been able to reach the landlord.  I offered to make a referral to [employer] [facility]) for possible assistance working with her landlord. I said there is no guarantee that they can help; but we can try.  She was in agreement and the referral was sent to [facility].

## 2024-07-10 ENCOUNTER — Other Ambulatory Visit: Payer: Self-pay

## 2024-07-22 ENCOUNTER — Other Ambulatory Visit: Payer: Self-pay

## 2024-08-06 ENCOUNTER — Telehealth: Payer: Self-pay | Admitting: Family Medicine

## 2024-08-06 NOTE — Telephone Encounter (Signed)
 Pt confirmed appt

## 2024-08-06 NOTE — Telephone Encounter (Signed)
 Pt confirmed appt (per vr)

## 2024-08-07 ENCOUNTER — Encounter: Payer: Self-pay | Admitting: Family Medicine

## 2024-08-07 ENCOUNTER — Ambulatory Visit: Attending: Family Medicine | Admitting: Family Medicine

## 2024-08-07 ENCOUNTER — Other Ambulatory Visit: Payer: Self-pay

## 2024-08-07 VITALS — BP 112/74 | HR 69 | Temp 97.6°F | Ht 69.0 in | Wt 218.2 lb

## 2024-08-07 DIAGNOSIS — I130 Hypertensive heart and chronic kidney disease with heart failure and stage 1 through stage 4 chronic kidney disease, or unspecified chronic kidney disease: Secondary | ICD-10-CM

## 2024-08-07 DIAGNOSIS — L84 Corns and callosities: Secondary | ICD-10-CM | POA: Diagnosis not present

## 2024-08-07 DIAGNOSIS — I152 Hypertension secondary to endocrine disorders: Secondary | ICD-10-CM | POA: Diagnosis not present

## 2024-08-07 DIAGNOSIS — E1169 Type 2 diabetes mellitus with other specified complication: Secondary | ICD-10-CM

## 2024-08-07 DIAGNOSIS — E1159 Type 2 diabetes mellitus with other circulatory complications: Secondary | ICD-10-CM

## 2024-08-07 DIAGNOSIS — Z794 Long term (current) use of insulin: Secondary | ICD-10-CM | POA: Diagnosis not present

## 2024-08-07 DIAGNOSIS — I25110 Atherosclerotic heart disease of native coronary artery with unstable angina pectoris: Secondary | ICD-10-CM

## 2024-08-07 DIAGNOSIS — M542 Cervicalgia: Secondary | ICD-10-CM | POA: Diagnosis not present

## 2024-08-07 DIAGNOSIS — K219 Gastro-esophageal reflux disease without esophagitis: Secondary | ICD-10-CM

## 2024-08-07 DIAGNOSIS — R112 Nausea with vomiting, unspecified: Secondary | ICD-10-CM | POA: Diagnosis not present

## 2024-08-07 DIAGNOSIS — M549 Dorsalgia, unspecified: Secondary | ICD-10-CM | POA: Diagnosis not present

## 2024-08-07 LAB — POCT GLYCOSYLATED HEMOGLOBIN (HGB A1C): HbA1c, POC (controlled diabetic range): 8.4 % — AB (ref 0.0–7.0)

## 2024-08-07 MED ORDER — ROSUVASTATIN CALCIUM 40 MG PO TABS
40.0000 mg | ORAL_TABLET | Freq: Every day | ORAL | 2 refills | Status: AC
  Filled 2024-08-07 – 2024-09-04 (×2): qty 90, 90d supply, fill #0

## 2024-08-07 MED ORDER — METHOCARBAMOL 500 MG PO TABS
500.0000 mg | ORAL_TABLET | Freq: Three times a day (TID) | ORAL | 1 refills | Status: AC | PRN
  Filled 2024-08-07: qty 60, 20d supply, fill #0

## 2024-08-07 MED ORDER — GLIPIZIDE 5 MG PO TABS
5.0000 mg | ORAL_TABLET | Freq: Two times a day (BID) | ORAL | 1 refills | Status: AC
  Filled 2024-08-07: qty 180, 90d supply, fill #0

## 2024-08-07 NOTE — Patient Instructions (Signed)
 VISIT SUMMARY:  Today, you had a follow-up appointment to manage your diabetes and discuss other health concerns. We reviewed your current medications, symptoms, and recent lab results. We also discussed your back and neck pain, foot pain, chronic vomiting, and overall health maintenance.  YOUR PLAN:  -TYPE 2 DIABETES MELLITUS WITH OTHER SPECIFIED COMPLICATION: Your A1c level has increased to 8.4% because you stopped taking insulin  due to stomach pain. We are referring you to an endocrinologist to evaluate if an insulin  pump might be a better option for you. We have also prescribed glipizide  5 mg to be taken twice daily to help control your blood sugar. Please continue to monitor your blood sugar regularly and adjust the glipizide  if you experience low blood sugar. We encourage you to resume insulin  therapy if possible.  -MUSCULOSKELETAL BACK AND NECK PAIN: You have chronic back and neck pain that worsens with activity and improves when lying down. We are referring you to physical therapy to help manage this pain and have prescribed a muscle relaxant to use as needed.  -LEFT FOOT PAIN WITH CORN: You have a painful corn on your left foot that is aggravated by pressure from shoes. We are referring you to a podiatrist for further evaluation and management.  -CHRONIC VOMITING AND EVALUATION FOR GASTROINTESTINAL DISORDER: You experience daily vomiting, usually around midday. We are testing for a possible Helicobacter pylori infection, which can cause stomach issues. If the test is negative, we may consider prescribing omeprazole  or pantoprazole  to help with your symptoms. If the vomiting continues, we will refer you to a gastroenterologist for further evaluation.  -HYPERTENSION: Your blood pressure is well-controlled with your current medications. Please continue taking them as prescribed.  -HEART FAILURE: You have a history of heart failure and it is important to follow up with your cardiologist to  manage this condition.  -CHRONIC KIDNEY DISEASE: You have chronic kidney disease, which requires regular monitoring, especially with your diabetes. We will continue to monitor your kidney function regularly.  INSTRUCTIONS:  Please follow up with the endocrinologist for the insulin  pump evaluation and with the podiatrist for your foot pain. Continue monitoring your blood sugar and adjust glipizide  if needed. If your vomiting persists, follow up with a gastroenterologist. Make sure to keep your appointment with your cardiologist for your heart condition. Regularly monitor your kidney function as part of your diabetes management.

## 2024-08-07 NOTE — Progress Notes (Signed)
 "  Subjective:  Patient ID: Amy Chaney, female    DOB: [birth year]  Age: 54 y.o. MRN: [id number]  CC: Medical Management of Chronic Issues (Back pain X 1 year / Knot in the left foot X7 Years / neck pain X1 Year)     Discussed the use of AI scribe software for clinical note transcription with the patient, who gave verbal consent to proceed.  History of Present Illness Amy Chaney is a 54 year old female with with  a history of Type 2 diabetes mellitus, hypertension, CHF (EF 50 to 55% from echo of 02/2021), CAD (status post PCI to LAD with 3 overlapping DES in 09/2017), STEMI in 06/2018  who presents for follow-up of her diabetes management.  Her A1c increased from 7.6 to 8.4. She stopped Lantus  about 1 to 1.5 years ago due to injection site pain. She currently manages diabetes with Trulicity  1.5 mg, which she tolerates, but 3 mg caused significant sickness. She checks blood sugar daily and finds it stable unless she drinks soda or is sedentary at work. Current medications include carvedilol , Plavix , Trulicity , Lantus  (not taking), prednisone , and Entresto . She is allergic to Farxiga . She is interested in an OmniPod given problems with Lantus .  CGM data:  30 day average: 151,  GMI 6.9 Time in range: 76% High: 21% Very high: 2% Low: 1% Very low: 0%    She has had back and neck pain for about a year. Back pain worsens with bending and improves when lying down, without radiation to the legs. Neck pain is associated with feeling hot and weakness in her arms and neck, making it hard to lift her arms or hold up her head. A 2023 neck x-ray showed no arthritis or fracture. Tylenol  gives partial relief but pain returns with activity. She works as a LAWYER.  She has a chronic knot on her left foot that is painful with shoes and walking.  She has daily vomiting between 11 AM and 12 PM after eating. She has not identified a specific trigger and usually has minimal solid food in the morning,  mainly fluids.  She denies chest pain, shortness of breath, or leg swelling. She has stents and heart failure and is trying to see her cardiologist.     Past Medical History:  Diagnosis Date   Coronary artery disease involving native coronary artery of native heart with angina pectoris 01/2013   a. s/p PCI of the mid RCA 01/2013 //  b. LHC 9/14: EF 55%, RCA stent 100% occluded, LAD irregularities >>  subacute stent thrombosis, Promus DES PCI distal overlap // c) 09/2017 - Ant STEMI - LAD-D2 PCI - Successful PCI of LAD (3 overlapping DES), unable to restore flow down D2l that was stented as well.  Likely related to downstream dissection, but unable to rewire.   Daily headache    Depression    Dyslipidemia, goal LDL below 70    History of Doppler ultrasound    carotid bruit >> a. 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  EF down to 20-25% global hypokinesis.  Was in the setting of grieving over the loss of her son from MI.  ->  EF improved to 45-50% on follow-up echocardiogram.   Hypertension    Ischemic cardiomyopathy 06/2018   s/p inferior STEMI followed by 2 anterior STEMIs: Following recovery from Takotsubo Cardiomyopathy- Echo 02/14/19: Moderate  LVH.  EF improved up to 45-50%.  GR 1 DD.  Severe apical akinesis.  Normal valves.   Kidney disease    Mild dysplasia of cervix (CIN I) 02/04/2019   Seen after colposcopy on 01/23/19 done for ASCU +HPV pap  > repeat pap and HPV test in 12 and 24 months   STEMI involving left anterior descending coronary artery (HCC) 09/2017; 06/2018   a) Severe Medina 1, 1, 1 LAD-D2 lesion (complicated by post PTCA dissection/intramural hematoma) -successful extensive PCI of the LAD but unable to maintain patency of the stented D2. b) distal mLAD stent Edge 100% thrombosis --> PTCA & Overlapping DES PCI (Synergy 3 x 12 --> 3.6-3.3 mm). D2 occluded. RCA  stents patent, 65 % OM2. EF 30-35%.   STEMI involving oth coronary artery of inferior wall (HCC) 03/2013   Secondary to subacute stent thrombosis of RCA stent segment having missed 4 doses of Effient .   Tobacco abuse 01/11/2016   Type II diabetes mellitus (HCC) 12/2010   sister and son also diabetic     Past Surgical History:  Procedure Laterality Date   CHOLECYSTECTOMY  ~ 2000   CORONARY STENT INTERVENTION N/A 10/10/2017   Procedure: CORONARY STENT INTERVENTION;  Surgeon: Anner Alm ORN, MD;  Location: [facility] INVASIVE CV LAB;  Service: Cardiovascular; LAD PCI: STENT SYNERGY DES 3.5X32 (p),STENT SYNERGY DES 3.5X 8 (m), STENT SYNERGY DES 3.5X28 (d).  D2 PCI: STENT SYNERGY DES 2.5X24.  (UNABL TO REWIRE & EXPAND - TO OF DIAG AT END OF CASE)   CORONARY STENT INTERVENTION N/A 07/03/2018   Procedure: CORONARY STENT INTERVENTION;  Surgeon: Anner Alm ORN, MD;  Location: [facility] INVASIVE CV LAB;; PTCA & overlapping DES PCI (overlaps prior stents distally) - Synergy DES 3 x 12 (3.6 mm @ overlap -- 3.3 mm distally)>   LEFT HEART CATH AND CORONARY ANGIOGRAPHY N/A 10/10/2017   Procedure: LEFT HEART CATH AND CORONARY ANGIOGRAPHY;  Surgeon: Anner Alm ORN, MD;  Location: [facility] INVASIVE CV LAB;  Service: Cardiovascular;  Laterality: N/A; -patent RCA stents with mild in-stent restenosis. Medina 1,1,1 mLD-D2 90% (complicated by dissection/intramural thrombus)   LEFT HEART CATH AND CORONARY ANGIOGRAPHY N/A 07/03/2018   Procedure: LEFT HEART CATH AND CORONARY ANGIOGRAPHY;  Surgeon: Anner Alm ORN, MD;  Location: [facility] INVASIVE CV LAB;; 100% thrombotic occlusion distal LAD stent -- DES PCI. continued 100% D2 (previously lost). patent RCA stents, 65% OM2.  EF 30-35%.   LEFT HEART CATHETERIZATION WITH CORONARY ANGIOGRAM N/A 02/25/2013   Procedure: LEFT HEART CATHETERIZATION WITH CORONARY ANGIOGRAM;  Surgeon: Erick JONELLE Bergamo, MD;  Location: [facility] CATH LAB;  Service: Cardiovascular;; CTO m RCA; otherwise normal coronaries   LEFT  HEART CATHETERIZATION WITH CORONARY ANGIOGRAM N/A 04/01/2013   Procedure: LEFT HEART CATHETERIZATION WITH CORONARY ANGIOGRAM;  Surgeon: Erick JONELLE Bergamo, MD;  Location: [facility] CATH LAB;  Service: Cardiovascular: 100% occlusion of distal RCA stent (subacute stent thrombosis -  secondary to stopping Effient ).  Overlapping DES PCI   NM MYOVIEW  LTD  12/2015    EF 38%.  Hypertensive response to exercise (231/132 mmHg).  INTERMEDIATE RISK due to -diffuse hypokinesis and reduced EF.  No ischemia or infarction.   PERCUTANEOUS CORONARY STENT INTERVENTION (PCI-S)  04/01/2013   Procedure: PERCUTANEOUS CORONARY STENT INTERVENTION (PCI-S);  Surgeon: Erick JONELLE Bergamo, MD;  Location: [facility] CATH LAB;  Service: Cardiovascular;; PCI of distal RCA stent subacute thrombosis: Promus Premier DES 3.0 mm x 20 mm.   PERCUTANEOUS CORONARY STENT INTERVENTION (PCI-S)  02/25/2013   Procedure: PERCUTANEOUS  CORONARY STENT INTERVENTION (PCI-S);  Surgeon: Erick JONELLE Bergamo, MD;  Location: [facility] CATH LAB;  RCA CTO PCI with overlapping Promus DES: 3.0 mm x 38 mm, 3.5 mm x 16mm   RIGHT/LEFT HEART CATH AND CORONARY ANGIOGRAPHY N/A 11/04/2019   Procedure: RIGHT/LEFT HEART CATH AND CORONARY ANGIOGRAPHY;  Surgeon: Anner Alm ORN, MD;  Location: [facility] INVASIVE CV LAB;; Mildly elevated LVEDP.  Ostial D1 100%.  Proximal to mid LAD stent 10% ISR.  Mid and distal stent widely patent.  30% distal distance.  Ostial OM 2 55%.  Proximal to mid RCA stent 50% stenosis.  Mid to distal RCA 40%.  Suspect that reduced EF is related to hypertensive emergen   TRANSTHORACIC ECHOCARDIOGRAM  07/04/2018   recurrent Anterior STEMI: EF 35-40%.  Apical hypokinesis.  GRII DD.  No thrombus noted.  (Improved EF from 30% up to 35-40% from previous echo)   TRANSTHORACIC ECHOCARDIOGRAM  11/2018; 01/2019   a) TAKOTUSBO CM: EF 20-25%-diffuse HK..  Mod V thickness.  GRII DD w/ high LVEDP.  Severe LA dilation;; b) 02/14/19: Moderate LVH.  EF improved up to 45-50%.  GR 1 DD.  Severe apical  akinesis.  Normal valves.   TRANSTHORACIC ECHOCARDIOGRAM  10/31/2019   Hypertensive emergency, hypoxic/hypercapnic respiratory failure;  EF 20 to 25%.  Global HK.  Mildly dilated LV.  Mildly elevated PA pressures.  Mild LA dilation.  Mild AR.  Mildly elevated RAP-8 mmHg.=> Eventual cath showed no change   TUBAL LIGATION  1994    Family History  Problem Relation Age of Onset   Diabetes Mother    Diabetes Sister    Diabetes Sister    Diabetes Son        type 1   Liver disease Neg Hx    Colon cancer Neg Hx    Esophageal cancer Neg Hx     Social History   Socioeconomic History   Marital status: Single    Spouse name: Not on file   Number of children: 3   Years of education: Not on file   Highest education level: 11th grade  Occupational History    Employer: [employer]   Occupation: pca  Tobacco Use   Smoking status: Every Day    Current packs/day: 0.25    Average packs/day: 0.3 packs/day for 22.0 years (5.5 ttl pk-yrs)    Types: Cigarettes   Smokeless tobacco: Never   Tobacco comments:    current smoker 4-5 cigarettes per day; previously smoked at least a pack a day.        6 AM cigarette-06-15-23  Vaping Use   Vaping status: Never Used  Substance and Sexual Activity   Alcohol use: Not Currently    Comment: 07/04/2018 only on special occasions; maybe once/yr   Drug use: Not Currently    Types: Marijuana    Comment: 02/25/2013 quit marijuana ~ 2001   Sexual activity: Not Currently    Birth control/protection: Surgical, Post-menopausal  Other Topics Concern   Not on file  Social History Narrative   Diet: 50/50 take out and home cook meals (eats burgers and lots of fried foods), limits portions.       Exercise: walks 15-20 minutes twice per week   Social Drivers of Health   Tobacco Use: High Risk (05/01/2024)   Patient History    Smoking Tobacco Use: Every Day    Smokeless Tobacco Use: Never    Passive Exposure: Not on file  Financial Resource Strain: High  Risk (04/03/2023)  Overall Financial Resource Strain (CARDIA)    Difficulty of Paying Living Expenses: Very hard  Food Insecurity: Food Insecurity Present (12/12/2022)   Hunger Vital Sign    Worried About Running Out of Food in the Last Year: Sometimes true    Ran Out of Food in the Last Year: Sometimes true  Transportation Needs: No Transportation Needs (12/12/2022)   PRAPARE - Administrator, Civil Service (Medical): No    Lack of Transportation (Non-Medical): No  Physical Activity: Inactive (12/12/2022)   Exercise Vital Sign    Days of Exercise per Week: 0 days    Minutes of Exercise per Session: 0 min  Stress: No Stress Concern Present (12/12/2022)   [employer] of Occupational Health - Occupational Stress Questionnaire    Feeling of Stress : Only a little  Social Connections: Moderately Integrated (12/12/2022)   Social Connection and Isolation Panel    Frequency of Communication with Friends and Family: Three times a week    Frequency of Social Gatherings with Friends and Family: Three times a week    Attends Religious Services: 1 to 4 times per year    Active Member of Clubs or Organizations: Yes    Attends Banker Meetings: 1 to 4 times per year    Marital Status: Divorced  Depression (PHQ2-9): High Risk (05/01/2024)   Depression (PHQ2-9)    PHQ-2 Score: 15  Alcohol Screen: Low Risk (12/12/2022)   Alcohol Screen    Last Alcohol Screening Score (AUDIT): 0  Housing: Medium Risk (04/03/2023)   Housing    Last Housing Risk Score: 1  Utilities: Not At Risk (04/03/2023)   AHC Utilities    Threatened with loss of utilities: No  Health Literacy: Not on file    Allergies[1]  Outpatient Medications Prior to Visit  Medication Sig Dispense Refill   carvedilol  (COREG ) 25 MG tablet Take 1 tablet (25 mg total) by mouth 2 (two) times daily. 180 tablet 1   clopidogrel  (PLAVIX ) 75 MG tablet Take 1 tablet (75 mg total) by mouth daily. 90 tablet 1   Continuous  Glucose Sensor (DEXCOM G7 SENSOR) MISC Use to check blood sugar throughout the day. Change sensors once every 10 days. E11.65 3 each 5   Dulaglutide  (TRULICITY ) 1.5 MG/0.5ML SOAJ Inject 1.5 mg into the skin once a week. 2 mL 6   famotidine  (PEPCID ) 20 MG tablet Take 1 tablet (20 mg total) by mouth at bedtime. 90 tablet 1   insulin  glargine (LANTUS  SOLOSTAR) 100 UNIT/ML Solostar Pen Inject 30 Units into the skin daily. 30 mL 6   Insulin  Pen Needle (TECHLITE PEN NEEDLES) 32G X 4 MM MISC Use 1 pen needle with insulin  once daily 100 each 2   predniSONE  (DELTASONE ) 20 MG tablet Take 1 tablet (20 mg total) by mouth daily with breakfast. 5 tablet 0   sacubitril -valsartan  (ENTRESTO ) 97-103 MG Take 1 tablet by mouth 2 (two) times daily. 180 tablet 2   spironolactone  (ALDACTONE ) 25 MG tablet Take 1 tablet (25 mg total) by mouth daily. 90 tablet 3   TRUEPLUS LANCETS 28G MISC 28 g by Does not apply route 4 (four) times daily. 120 each 2   rosuvastatin  (CRESTOR ) 40 MG tablet Take 1 tablet (40 mg total) by mouth daily. 90 tablet 2   guaiFENesin  (MUCINEX ) 600 MG 12 hr tablet Take 2 tablets (1,200 mg total) by mouth 2 (two) times daily. (Patient not taking: Reported on 08/07/2024) 30 tablet 0   Multiple Vitamins-Minerals (WOMENS  50+ MULTI VITAMIN PO) Take 1 tablet by mouth daily. (Patient not taking: Reported on 08/07/2024)     nitroGLYCERIN  (NITROSTAT ) 0.4 MG SL tablet Place 1 tablet (0.4 mg total) under the tongue every 5 (five) minutes as needed for chest pain. Reported on 11/25/2015 (Patient not taking: Reported on 08/07/2024) 25 tablet 3   promethazine -dextromethorphan (PROMETHAZINE -DM) 6.25-15 MG/5ML syrup Take 5 mLs by mouth 4 (four) times daily as needed for cough. (Patient not taking: Reported on 08/07/2024) 240 mL 0   No facility-administered medications prior to visit.     ROS Review of Systems  Constitutional:  Negative for activity change and appetite change.  HENT:  Negative for sinus pressure and sore  throat.   Respiratory:  Negative for chest tightness, shortness of breath and wheezing.   Cardiovascular:  Negative for chest pain and palpitations.  Gastrointestinal:  Negative for abdominal distention, abdominal pain and constipation.  Genitourinary: Negative.   Musculoskeletal:        See HPI  Psychiatric/Behavioral:  Negative for behavioral problems and dysphoric mood.     Objective:  BP 112/74   Pulse 69   Temp 97.6 F (36.4 C) (Oral)   Ht 5' 9 (1.753 m)   Wt 218 lb 3.2 oz (99 kg)   LMP 10/20/2015   SpO2 100%   BMI 32.22 kg/m      08/07/2024    9:50 AM 05/01/2024    9:41 AM 04/18/2024    1:02 PM  BP/Weight  Systolic BP 112 131 155  Diastolic BP 74 84 86  Wt. (Lbs) 218.2 221.8   BMI 32.22 kg/m2 32.75 kg/m2       Physical Exam Constitutional:      Appearance: She is well-developed.  Cardiovascular:     Rate and Rhythm: Normal rate.     Heart sounds: Normal heart sounds. No murmur heard. Pulmonary:     Effort: Pulmonary effort is normal.     Breath sounds: Normal breath sounds. No wheezing or rales.  Chest:     Chest wall: No tenderness.  Abdominal:     General: Bowel sounds are normal. There is no distension.     Palpations: Abdomen is soft. There is no mass.     Tenderness: There is no abdominal tenderness.  Musculoskeletal:        General: Normal range of motion.     Cervical back: Normal range of motion. No tenderness.     Right lower leg: No edema.     Left lower leg: No edema.     Comments: No TTP of lumbar spine, negative straight leg raise bilaterally Corn of left foot, TTP  Neurological:     Mental Status: She is alert and oriented to person, place, and time.  Psychiatric:        Mood and Affect: Mood normal.        Latest Ref Rng & Units 05/01/2024   10:28 AM 02/05/2024    8:44 AM 04/13/2023    8:59 AM  CMP  Glucose 70 - 99 mg/dL 802  809  841   BUN 6 - 24 mg/dL 19  26  20    Creatinine 0.57 - 1.00 mg/dL 8.33  8.17  8.86   Sodium 134 - 144  mmol/L 141  139  139   Potassium 3.5 - 5.2 mmol/L 4.1  4.4  4.0   Chloride 96 - 106 mmol/L 104  104  102   CO2 20 - 29 mmol/L 19  20  22   Calcium  8.7 - 10.2 mg/dL 8.8  9.4  9.2   Total Protein 6.0 - 8.5 g/dL 7.2  7.6  7.1   Total Bilirubin 0.0 - 1.2 mg/dL 0.4  0.3  0.4   Alkaline Phos 49 - 135 IU/L 47  52  45   AST 0 - 40 IU/L 14  12  15    ALT 0 - 32 IU/L 8  10  10      Lipid Panel     Component Value Date/Time   CHOL 105 02/05/2024 0844   TRIG 77 02/05/2024 0844   HDL 44 02/05/2024 0844   CHOLHDL 2.6 04/13/2023 0859   CHOLHDL 4.2 07/05/2018 0330   VLDL 15 07/05/2018 0330   LDLCALC 45 02/05/2024 0844    CBC    Component Value Date/Time   WBC 11.2 (H) 04/13/2023 0859   WBC 3.5 (L) 07/06/2022 1556   RBC 4.40 04/13/2023 0859   RBC 4.37 07/06/2022 1556   HGB 11.9 04/13/2023 0859   HCT 37.2 04/13/2023 0859   PLT 361 04/13/2023 0859   MCV 85 04/13/2023 0859   MCH 27.0 04/13/2023 0859   MCH 27.0 07/06/2022 1556   MCHC 32.0 04/13/2023 0859   MCHC 33.3 07/06/2022 1556   RDW 13.4 04/13/2023 0859   LYMPHSABS 0.9 07/06/2022 1556   LYMPHSABS 3.1 03/14/2022 1653   MONOABS 0.6 07/06/2022 1556   EOSABS 0.2 07/06/2022 1556   EOSABS 0.4 03/14/2022 1653   BASOSABS 0.1 07/06/2022 1556   BASOSABS 0.1 03/14/2022 1653    Lab Results  Component Value Date   HGBA1C 8.4 (A) 08/07/2024   Lab Results  Component Value Date   HGBA1C 8.4 (A) 08/07/2024   HGBA1C 7.6 (A) 05/01/2024   HGBA1C 9.1 (A) 01/30/2024        Assessment & Plan Type 2 diabetes mellitus with other specified complication A1c increased to 8.4 due to non-adherence to insulin . Reports stomach pain with insulin  and nausea with Trulicity  3 mg. - Referred to endocrinology for insulin  pump evaluation. - Prescribed glipizide  5 mg twice daily. - Advised regular blood sugar monitoring. -CGM data reveals over the last 30 days GMI is6.9% hence it does reveal some improvement - Instructed to decrease glipizide  to 2.5 mg  twice daily if hypoglycemia occurs. - Encouraged insulin  therapy resumption.  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 No arthritis or fracture on x-ray. - Referred to physical therapy. - Prescribed muscle relaxant as needed.  Left foot pain with corn Chronic pain due to corn exacerbated by pressure. - Referred to podiatry for evaluation and management.  Chronic nausea and vomiting Chronic daily vomiting, differential includes Helicobacter pylori infection. - Ordered Helicobacter pylori test. - Consider omeprazole  or pantoprazole  if test negative. - Refer to gastroenterology for endoscopy if symptoms persist.   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  Unable to tolerate SGLT2i due to allergy -Blood pressure is controlled -Avoid nephrotoxins -Uncertain if she is being followed by nephrology.  If labs reveal persistently abnormal kidney function will refer to nephrology - Encouraged cardiology follow-up.   CAD Status post PCI - Risk factor modification including maintaining LDL at goal, blood pressure and glycemic control - Continue high intensity statin     Meds ordered this encounter  Medications   glipiZIDE  (GLUCOTROL ) 5 MG tablet  Sig: Take 1 tablet (5 mg total) by mouth 2 (two) times daily before a meal.    Dispense:  180 tablet    Refill:  1   methocarbamol  (ROBAXIN ) 500 MG tablet    Sig: Take 1 tablet (500 mg total) by mouth every 8 (eight) hours as needed for muscle spasms.    Dispense:  60 tablet    Refill:  1   rosuvastatin  (CRESTOR ) 40 MG tablet    Sig: Take 1 tablet (40 mg total) by mouth daily.    Dispense:  90 tablet    Refill:  2    Follow-up: Return in about 3 months (around 11/05/2024) for Chronic medical conditions.       Corrina Sabin, MD,  FAAFP. [facility] [place], [place] [phone]   08/07/2024, 3:53 PM     [1]  Allergies Allergen Reactions   Farxiga  [Dapagliflozin ] Other (See Comments)    Caused UTI and polyps on kidneys    "

## 2024-08-08 ENCOUNTER — Ambulatory Visit: Payer: Self-pay | Admitting: Family Medicine

## 2024-08-08 DIAGNOSIS — N184 Chronic kidney disease, stage 4 (severe): Secondary | ICD-10-CM

## 2024-08-09 LAB — CMP14+EGFR
ALT: 9 IU/L (ref 0–32)
AST: 13 IU/L (ref 0–40)
Albumin: 4.3 g/dL (ref 3.8–4.9)
Alkaline Phosphatase: 46 IU/L — ABNORMAL LOW (ref 49–135)
BUN/Creatinine Ratio: 17 (ref 9–23)
BUN: 38 mg/dL — ABNORMAL HIGH (ref 6–24)
Bilirubin Total: 0.4 mg/dL (ref 0.0–1.2)
CO2: 18 mmol/L — ABNORMAL LOW (ref 20–29)
Calcium: 9.7 mg/dL (ref 8.7–10.2)
Chloride: 106 mmol/L (ref 96–106)
Creatinine, Ser: 2.27 mg/dL — ABNORMAL HIGH (ref 0.57–1.00)
Globulin, Total: 3.2 g/dL (ref 1.5–4.5)
Glucose: 139 mg/dL — ABNORMAL HIGH (ref 70–99)
Potassium: 4.7 mmol/L (ref 3.5–5.2)
Sodium: 139 mmol/L (ref 134–144)
Total Protein: 7.5 g/dL (ref 6.0–8.5)
eGFR: 25 mL/min/1.73 — ABNORMAL LOW

## 2024-08-09 LAB — H. PYLORI BREATH COLLECTION

## 2024-08-09 LAB — H. PYLORI BREATH TEST

## 2024-08-11 ENCOUNTER — Other Ambulatory Visit: Payer: Self-pay

## 2024-08-11 MED ORDER — OMEPRAZOLE 40 MG PO CPDR
40.0000 mg | DELAYED_RELEASE_CAPSULE | Freq: Every day | ORAL | 1 refills | Status: AC
  Filled 2024-08-11: qty 30, 30d supply, fill #0

## 2024-08-13 ENCOUNTER — Encounter: Payer: Self-pay | Admitting: Podiatry

## 2024-08-13 ENCOUNTER — Ambulatory Visit: Admitting: Podiatry

## 2024-08-13 DIAGNOSIS — L600 Ingrowing nail: Secondary | ICD-10-CM

## 2024-08-13 DIAGNOSIS — D492 Neoplasm of unspecified behavior of bone, soft tissue, and skin: Secondary | ICD-10-CM

## 2024-08-13 NOTE — Progress Notes (Signed)
 Patient resents with complaint of pain and a painful lesion on the plantar lateral aspect of the left foot.  Also complains of pain with the ingrown nail borders on the hallux left.  The skin lesion has been bothering her for a year or 2 now.  Has been painful to walk on.  The nails been bothering her for several years and becoming increasingly a problem with tenderness along the borders of the nail.   Physical exam:  General appearance: Pleasant, and in no acute distress. AOx3.  Vascular: Pedal pulses: DP 2/4 bilaterally, PT 2 to/4 bilaterally.  Mild edema lower legs bilaterally. Capillary fill time immediate bilaterally.  Neurological: Light touch intact feet bilaterally.  Normal Achilles reflex bilaterally.  No clonus or spasticity noted.   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  Tenderness lateral pressure on the lesion.  Ingrown medial and laterally borders hallux nail left with incurvated borders with tenderness.  Hypertrophy of the nail folds.  Redness along the nail folds.  No drainage or signs of infection today.  Skin normal temperature bilaterally.  Skin normal color, tone, and texture bilaterally.   Musculoskeletal: Tenderness to hallux left.  Normal muscle strength lower extremity bilaterally    Diagnosis: 1.  Ingrown hallux nail borders left. 2.  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  Plan: -New patient office visit for evaluation and management level 3.  Modifier 25. - Discussed the ingrown nail on the hallux left.  Recommended permanent removal of nail borders.  Discussed this procedure with her.  Will wait for a week to do this and she will have to keep the dressing for the verrucous lesion dry for the next 2 days. -Discussed the verrucous lesion.  Discussed etiology and treatment discussed other possible differential diagnoses of the  lesion.  -Applied Salinocaine compound to lesion(s) as noted in physical exam after debriding lesions to pinpoint bleeding.  Salinocaine applied to lesion(s) and covered with an occlusive dressing with Coban wrap.  Written and oral instructions given to patient. .   Return 1 week matricectomy nail borders hallux left, then 1 more week for second treatment of verrucous lesion.

## 2024-08-15 ENCOUNTER — Other Ambulatory Visit: Payer: Self-pay

## 2024-08-27 ENCOUNTER — Encounter: Payer: Self-pay | Admitting: Podiatry

## 2024-08-27 ENCOUNTER — Ambulatory Visit: Admitting: Physical Therapy

## 2024-08-27 ENCOUNTER — Ambulatory Visit: Admitting: Podiatry

## 2024-08-27 DIAGNOSIS — D492 Neoplasm of unspecified behavior of bone, soft tissue, and skin: Secondary | ICD-10-CM | POA: Diagnosis not present

## 2024-08-27 NOTE — Progress Notes (Signed)
 Patient presents follow-up treatment of verrucous lesion and ingrown nail hallux left.  Says the area site of the verrucous lesion is feeling better not nearly as sore as before.  She has been soaking the foot and the nail feels better.  Physical exam:  General appearance: Pleasant, and in no acute distress. AOx3.  Vascular: Pedal pulses: DP 2/4 bilaterally, PT 2/4 bilaterally.  Mild edema lower legs bilaterally. Capillary fill time immediate bilaterally.  Neurological: Grossly intact bilaterally  Dermatologic:   Verrucous neoplastic lesion plantar lateral foot left improved and reduced in size.  Skin lines go around the lesion with several pinpoint hemorrhages.  Incurvated nail borders hallux bilaterally with no tenderness with pressure and only on the borders today skin normal temperature bilaterally.  Skin normal color, tone, and texture bilaterally.   Musculoskeletal:      Diagnosis: 1.  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 -Verrucous lesion improved.  Will do second application of Salinocaine - Applied Salinocaine compound to lesion(s) as noted in physical exam after debriding lesions to pinpoint bleeding.  Salinocaine applied to lesion(s) and covered with an occlusive dressing with Coban wrap.  Written and oral instructions given to patient.   Return   2 weeks follow-up wart left

## 2024-08-29 ENCOUNTER — Other Ambulatory Visit: Payer: Self-pay

## 2024-08-29 ENCOUNTER — Other Ambulatory Visit (HOSPITAL_COMMUNITY): Payer: Self-pay

## 2024-09-01 ENCOUNTER — Other Ambulatory Visit (HOSPITAL_COMMUNITY): Payer: Self-pay

## 2024-09-03 LAB — LAB REPORT - SCANNED: EGFR: 30

## 2024-09-04 ENCOUNTER — Other Ambulatory Visit: Payer: Self-pay

## 2024-09-04 MED ORDER — METRONIDAZOLE 500 MG PO TABS
500.0000 mg | ORAL_TABLET | Freq: Two times a day (BID) | ORAL | 0 refills | Status: AC
  Filled 2024-09-04: qty 14, 7d supply, fill #0

## 2024-09-09 ENCOUNTER — Ambulatory Visit: Admitting: Podiatry

## 2024-09-11 ENCOUNTER — Ambulatory Visit

## 2024-11-05 ENCOUNTER — Ambulatory Visit: Payer: Self-pay | Admitting: Family Medicine

## 2025-01-06 ENCOUNTER — Ambulatory Visit: Admitting: "Endocrinology
